# Patient Record
Sex: Male | Born: 1935 | Race: Black or African American | Hispanic: No | Marital: Married | Smoking: Former smoker
Health system: Southern US, Community
[De-identification: ages and names within clinical notes are randomized; demographics above are authoritative.]

## PROBLEM LIST (undated history)

## (undated) ENCOUNTER — Ambulatory Visit (HOSPITAL_COMMUNITY): Payer: Medicare Other

## (undated) DIAGNOSIS — I340 Nonrheumatic mitral (valve) insufficiency: Secondary | ICD-10-CM

## (undated) DIAGNOSIS — I10 Essential (primary) hypertension: Secondary | ICD-10-CM

## (undated) DIAGNOSIS — J3089 Other allergic rhinitis: Secondary | ICD-10-CM

## (undated) DIAGNOSIS — N35919 Unspecified urethral stricture, male, unspecified site: Secondary | ICD-10-CM

## (undated) DIAGNOSIS — E113293 Type 2 diabetes mellitus with mild 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, bilateral: Secondary | ICD-10-CM

## (undated) DIAGNOSIS — I77810 Thoracic aortic ectasia: Secondary | ICD-10-CM

## (undated) DIAGNOSIS — E785 Hyperlipidemia, unspecified: Secondary | ICD-10-CM

## (undated) DIAGNOSIS — K449 Diaphragmatic hernia without obstruction or gangrene: Secondary | ICD-10-CM

## (undated) DIAGNOSIS — I2510 Atherosclerotic heart disease of native coronary artery without angina pectoris: Secondary | ICD-10-CM

## (undated) DIAGNOSIS — I739 Peripheral vascular disease, unspecified: Secondary | ICD-10-CM

## (undated) DIAGNOSIS — N401 Enlarged prostate with lower urinary tract symptoms: Secondary | ICD-10-CM

## (undated) DIAGNOSIS — E669 Obesity, unspecified: Secondary | ICD-10-CM

## (undated) DIAGNOSIS — E1129 Type 2 diabetes mellitus with other diabetic kidney complication: Secondary | ICD-10-CM

## (undated) DIAGNOSIS — E1142 Type 2 diabetes mellitus with diabetic polyneuropathy: Secondary | ICD-10-CM

## (undated) DIAGNOSIS — I351 Nonrheumatic aortic (valve) insufficiency: Secondary | ICD-10-CM

## (undated) DIAGNOSIS — K219 Gastro-esophageal reflux disease without esophagitis: Secondary | ICD-10-CM

## (undated) DIAGNOSIS — M25512 Pain in left shoulder: Secondary | ICD-10-CM

## (undated) DIAGNOSIS — I5022 Chronic systolic (congestive) heart failure: Secondary | ICD-10-CM

## (undated) DIAGNOSIS — G8929 Other chronic pain: Secondary | ICD-10-CM

## (undated) DIAGNOSIS — N3941 Urge incontinence: Secondary | ICD-10-CM

## (undated) DIAGNOSIS — Z8546 Personal history of malignant neoplasm of prostate: Secondary | ICD-10-CM

## (undated) DIAGNOSIS — R809 Proteinuria, unspecified: Secondary | ICD-10-CM

## (undated) DIAGNOSIS — Z794 Long term (current) use of insulin: Secondary | ICD-10-CM

## (undated) HISTORY — PX: BREAST SURGERY: SHX581

## (undated) HISTORY — DX: Proteinuria, unspecified: R80.9

## (undated) HISTORY — DX: Nonrheumatic mitral (valve) insufficiency: I34.0

## (undated) HISTORY — DX: Thoracic aortic ectasia: I77.810

## (undated) HISTORY — DX: Hyperlipidemia, unspecified: E78.5

## (undated) HISTORY — DX: Unspecified urethral stricture, male, unspecified site: N35.919

## (undated) HISTORY — DX: Benign prostatic hyperplasia with lower urinary tract symptoms: N40.1

## (undated) HISTORY — DX: Long term (current) use of insulin: Z79.4

## (undated) HISTORY — DX: Gastro-esophageal reflux disease without esophagitis: K21.9

## (undated) HISTORY — DX: Obesity, unspecified: E66.9

## (undated) HISTORY — DX: Pain in left shoulder: M25.512

## (undated) HISTORY — DX: Diaphragmatic hernia without obstruction or gangrene: K44.9

## (undated) HISTORY — DX: Type 2 diabetes mellitus with mild 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, bilateral: E11.3293

## (undated) HISTORY — DX: Other allergic rhinitis: J30.89

## (undated) HISTORY — DX: Other chronic pain: G89.29

## (undated) HISTORY — DX: Essential (primary) hypertension: I10

## (undated) HISTORY — DX: Nonrheumatic aortic (valve) insufficiency: I35.1

## (undated) HISTORY — DX: Personal history of malignant neoplasm of prostate: Z85.46

## (undated) HISTORY — DX: Urge incontinence: N39.41

## (undated) HISTORY — DX: Type 2 diabetes mellitus with diabetic polyneuropathy: E11.42

## (undated) HISTORY — DX: Peripheral vascular disease, unspecified: I73.9

## (undated) HISTORY — DX: Type 2 diabetes mellitus with other diabetic kidney complication: E11.29

## (undated) HISTORY — DX: Atherosclerotic heart disease of native coronary artery without angina pectoris: I25.10

## (undated) HISTORY — DX: Chronic systolic (congestive) heart failure: I50.22

## (undated) HISTORY — PX: CORONARY ANGIOPLASTY WITH STENT PLACEMENT: SHX49

---

## 1988-10-04 HISTORY — PX: PROSTATE SURGERY: SHX751

## 1997-07-27 ENCOUNTER — Other Ambulatory Visit: Admission: RE | Admit: 1997-07-27 | Discharge: 1997-07-27 | Payer: Self-pay | Admitting: Urology

## 1997-08-03 ENCOUNTER — Ambulatory Visit (HOSPITAL_COMMUNITY): Admission: RE | Admit: 1997-08-03 | Discharge: 1997-08-03 | Payer: Self-pay | Admitting: Urology

## 1997-08-11 ENCOUNTER — Encounter: Admission: RE | Admit: 1997-08-11 | Discharge: 1997-11-09 | Payer: Self-pay | Admitting: Radiation Oncology

## 1997-09-08 ENCOUNTER — Ambulatory Visit (HOSPITAL_COMMUNITY): Admission: RE | Admit: 1997-09-08 | Discharge: 1997-09-08 | Payer: Self-pay | Admitting: Radiation Oncology

## 1997-10-04 ENCOUNTER — Ambulatory Visit (HOSPITAL_COMMUNITY): Admission: RE | Admit: 1997-10-04 | Discharge: 1997-10-04 | Payer: Self-pay | Admitting: Urology

## 1997-10-04 ENCOUNTER — Encounter: Payer: Self-pay | Admitting: Urology

## 1997-10-05 ENCOUNTER — Emergency Department (HOSPITAL_COMMUNITY): Admission: EM | Admit: 1997-10-05 | Discharge: 1997-10-05 | Payer: Self-pay | Admitting: Emergency Medicine

## 1997-12-28 ENCOUNTER — Emergency Department (HOSPITAL_COMMUNITY): Admission: EM | Admit: 1997-12-28 | Discharge: 1997-12-28 | Payer: Self-pay | Admitting: Emergency Medicine

## 1998-09-20 ENCOUNTER — Inpatient Hospital Stay (HOSPITAL_COMMUNITY): Admission: RE | Admit: 1998-09-20 | Discharge: 1998-09-20 | Payer: Self-pay | Admitting: Urology

## 2000-05-20 ENCOUNTER — Ambulatory Visit (HOSPITAL_COMMUNITY): Admission: RE | Admit: 2000-05-20 | Discharge: 2000-05-21 | Payer: Self-pay | Admitting: Cardiovascular Disease

## 2001-12-22 ENCOUNTER — Encounter: Admission: RE | Admit: 2001-12-22 | Discharge: 2002-03-22 | Payer: Self-pay | Admitting: Internal Medicine

## 2002-03-30 ENCOUNTER — Encounter: Admission: RE | Admit: 2002-03-30 | Discharge: 2002-06-28 | Payer: Self-pay | Admitting: Internal Medicine

## 2003-11-15 ENCOUNTER — Ambulatory Visit: Payer: Self-pay | Admitting: Internal Medicine

## 2003-12-22 ENCOUNTER — Ambulatory Visit: Payer: Self-pay | Admitting: Internal Medicine

## 2004-05-28 ENCOUNTER — Ambulatory Visit: Payer: Self-pay | Admitting: Internal Medicine

## 2004-06-26 ENCOUNTER — Ambulatory Visit: Payer: Self-pay | Admitting: Internal Medicine

## 2004-08-08 ENCOUNTER — Ambulatory Visit: Payer: Self-pay | Admitting: Internal Medicine

## 2004-10-16 ENCOUNTER — Encounter: Payer: Self-pay | Admitting: Internal Medicine

## 2004-12-11 ENCOUNTER — Ambulatory Visit: Payer: Self-pay | Admitting: Internal Medicine

## 2004-12-23 ENCOUNTER — Ambulatory Visit: Payer: Self-pay | Admitting: Internal Medicine

## 2004-12-27 ENCOUNTER — Ambulatory Visit (HOSPITAL_COMMUNITY): Admission: RE | Admit: 2004-12-27 | Discharge: 2004-12-27 | Payer: Self-pay | Admitting: Internal Medicine

## 2004-12-31 ENCOUNTER — Ambulatory Visit: Payer: Self-pay | Admitting: Internal Medicine

## 2005-01-01 ENCOUNTER — Ambulatory Visit (HOSPITAL_COMMUNITY): Admission: RE | Admit: 2005-01-01 | Discharge: 2005-01-01 | Payer: Self-pay | Admitting: Internal Medicine

## 2005-01-08 ENCOUNTER — Ambulatory Visit: Payer: Self-pay | Admitting: Internal Medicine

## 2005-01-16 ENCOUNTER — Ambulatory Visit: Payer: Self-pay | Admitting: Internal Medicine

## 2005-03-14 ENCOUNTER — Ambulatory Visit: Payer: Self-pay | Admitting: Internal Medicine

## 2005-04-02 ENCOUNTER — Ambulatory Visit: Payer: Self-pay | Admitting: Internal Medicine

## 2005-04-04 ENCOUNTER — Ambulatory Visit (HOSPITAL_COMMUNITY): Admission: RE | Admit: 2005-04-04 | Discharge: 2005-04-04 | Payer: Self-pay | Admitting: Nephrology

## 2005-04-09 ENCOUNTER — Ambulatory Visit: Payer: Self-pay | Admitting: Internal Medicine

## 2005-04-23 ENCOUNTER — Ambulatory Visit (HOSPITAL_COMMUNITY): Admission: RE | Admit: 2005-04-23 | Discharge: 2005-04-23 | Payer: Self-pay | Admitting: Internal Medicine

## 2005-04-24 ENCOUNTER — Ambulatory Visit: Payer: Self-pay | Admitting: Internal Medicine

## 2005-06-12 ENCOUNTER — Ambulatory Visit: Payer: Self-pay | Admitting: Internal Medicine

## 2005-06-23 ENCOUNTER — Ambulatory Visit: Payer: Self-pay | Admitting: Internal Medicine

## 2005-07-25 ENCOUNTER — Encounter: Payer: Self-pay | Admitting: Internal Medicine

## 2005-07-31 ENCOUNTER — Ambulatory Visit: Payer: Self-pay | Admitting: Internal Medicine

## 2005-08-27 ENCOUNTER — Ambulatory Visit: Payer: Self-pay | Admitting: Internal Medicine

## 2005-08-28 ENCOUNTER — Ambulatory Visit: Payer: Self-pay | Admitting: Internal Medicine

## 2005-08-28 LAB — CONVERTED CEMR LAB
LDL Cholesterol: 94 mg/dL
VLDL: 24 mg/dL

## 2005-10-01 ENCOUNTER — Ambulatory Visit: Payer: Self-pay | Admitting: Internal Medicine

## 2005-11-04 ENCOUNTER — Encounter: Payer: Self-pay | Admitting: Internal Medicine

## 2005-11-04 DIAGNOSIS — E1142 Type 2 diabetes mellitus with diabetic polyneuropathy: Secondary | ICD-10-CM

## 2005-11-04 DIAGNOSIS — E785 Hyperlipidemia, unspecified: Secondary | ICD-10-CM

## 2005-11-04 DIAGNOSIS — K219 Gastro-esophageal reflux disease without esophagitis: Secondary | ICD-10-CM | POA: Insufficient documentation

## 2005-11-04 DIAGNOSIS — I10 Essential (primary) hypertension: Secondary | ICD-10-CM

## 2005-11-04 DIAGNOSIS — K449 Diaphragmatic hernia without obstruction or gangrene: Secondary | ICD-10-CM

## 2005-11-04 HISTORY — DX: Essential (primary) hypertension: I10

## 2005-11-04 HISTORY — DX: Hyperlipidemia, unspecified: E78.5

## 2005-11-04 HISTORY — DX: Diaphragmatic hernia without obstruction or gangrene: K21.9

## 2005-11-04 HISTORY — DX: Type 2 diabetes mellitus with diabetic polyneuropathy: E11.42

## 2005-11-13 ENCOUNTER — Ambulatory Visit: Payer: Self-pay | Admitting: Internal Medicine

## 2005-11-13 ENCOUNTER — Encounter (INDEPENDENT_AMBULATORY_CARE_PROVIDER_SITE_OTHER): Payer: Self-pay | Admitting: *Deleted

## 2005-11-13 LAB — CONVERTED CEMR LAB
Albumin: 4.6 g/dL (ref 3.5–5.2)
Alkaline Phosphatase: 72 units/L (ref 39–117)
BUN: 23 mg/dL (ref 6–23)
Calcium: 9.7 mg/dL (ref 8.4–10.5)
Sodium: 140 meq/L (ref 135–145)
Total Bilirubin: 0.3 mg/dL (ref 0.3–1.2)
Total Protein: 7.9 g/dL (ref 6.0–8.3)

## 2006-02-17 DIAGNOSIS — Z8546 Personal history of malignant neoplasm of prostate: Secondary | ICD-10-CM

## 2006-02-17 DIAGNOSIS — J3089 Other allergic rhinitis: Secondary | ICD-10-CM

## 2006-02-17 HISTORY — DX: Personal history of malignant neoplasm of prostate: Z85.46

## 2006-02-17 HISTORY — DX: Other allergic rhinitis: J30.89

## 2006-02-18 ENCOUNTER — Ambulatory Visit: Payer: Self-pay | Admitting: Internal Medicine

## 2006-02-18 LAB — CONVERTED CEMR LAB
ALT: 17 units/L (ref 0–53)
AST: 14 units/L (ref 0–37)
Albumin: 4.2 g/dL (ref 3.5–5.2)
Alkaline Phosphatase: 71 units/L (ref 39–117)
BUN: 19 mg/dL (ref 6–23)
Chloride: 105 meq/L (ref 96–112)
Glucose, Bld: 129 mg/dL — ABNORMAL HIGH (ref 70–99)
Potassium: 4.3 meq/L (ref 3.5–5.3)
Total Bilirubin: 0.3 mg/dL (ref 0.3–1.2)
Total Protein: 7.5 g/dL (ref 6.0–8.3)

## 2006-03-03 ENCOUNTER — Ambulatory Visit: Payer: Self-pay | Admitting: Internal Medicine

## 2006-03-06 ENCOUNTER — Telehealth (INDEPENDENT_AMBULATORY_CARE_PROVIDER_SITE_OTHER): Payer: Self-pay | Admitting: *Deleted

## 2006-05-11 ENCOUNTER — Telehealth (INDEPENDENT_AMBULATORY_CARE_PROVIDER_SITE_OTHER): Payer: Self-pay | Admitting: *Deleted

## 2006-05-11 ENCOUNTER — Ambulatory Visit: Payer: Self-pay | Admitting: Internal Medicine

## 2006-05-12 ENCOUNTER — Encounter: Payer: Self-pay | Admitting: Internal Medicine

## 2006-05-22 ENCOUNTER — Encounter: Payer: Self-pay | Admitting: Internal Medicine

## 2006-05-27 ENCOUNTER — Ambulatory Visit: Payer: Self-pay | Admitting: Internal Medicine

## 2006-05-27 LAB — CONVERTED CEMR LAB: Blood Glucose, Fingerstick: 110

## 2006-06-09 ENCOUNTER — Ambulatory Visit: Payer: Self-pay | Admitting: Internal Medicine

## 2006-06-26 ENCOUNTER — Telehealth: Payer: Self-pay | Admitting: *Deleted

## 2006-06-26 LAB — CONVERTED CEMR LAB
ALT: 16 units/L (ref 0–53)
AST: 14 units/L (ref 0–37)
Albumin: 4.2 g/dL (ref 3.5–5.2)
BUN: 16 mg/dL (ref 6–23)
Calcium: 9.7 mg/dL (ref 8.4–10.5)
Cholesterol: 165 mg/dL (ref 0–200)
Eosinophils Relative: 2 % (ref 0–5)
Glucose, Bld: 92 mg/dL (ref 70–99)
Hemoglobin: 14.2 g/dL (ref 13.0–17.0)
LDL Cholesterol: 114 mg/dL — ABNORMAL HIGH (ref 0–99)
Lymphocytes Relative: 31 % (ref 12–46)
MCHC: 32.5 g/dL (ref 30.0–36.0)
MCV: 91 fL (ref 78.0–100.0)
Monocytes Absolute: 0.7 10*3/uL (ref 0.2–0.7)
Total Bilirubin: 0.5 mg/dL (ref 0.3–1.2)
Total CHOL/HDL Ratio: 5.2
Total Protein: 7.5 g/dL (ref 6.0–8.3)
Triglycerides: 97 mg/dL (ref <150)
VLDL: 19 mg/dL (ref 0–40)

## 2006-07-01 ENCOUNTER — Telehealth (INDEPENDENT_AMBULATORY_CARE_PROVIDER_SITE_OTHER): Payer: Self-pay | Admitting: Pharmacy Technician

## 2006-07-10 ENCOUNTER — Ambulatory Visit: Payer: Self-pay | Admitting: Internal Medicine

## 2006-07-10 ENCOUNTER — Encounter (INDEPENDENT_AMBULATORY_CARE_PROVIDER_SITE_OTHER): Payer: Self-pay | Admitting: Internal Medicine

## 2006-07-10 ENCOUNTER — Telehealth: Payer: Self-pay | Admitting: *Deleted

## 2006-07-10 LAB — CONVERTED CEMR LAB
Blood Glucose, Fingerstick: 119
Creatinine, Urine: 282.7 mg/dL
Leukocytes, UA: NEGATIVE
Microalb, Ur: 2.03 mg/dL — ABNORMAL HIGH (ref 0.00–1.89)
Specific Gravity, Urine: 1.028 (ref 1.005–1.03)
Urobilinogen, UA: 0.2 (ref 0.0–1.0)

## 2006-07-26 ENCOUNTER — Encounter: Payer: Self-pay | Admitting: Internal Medicine

## 2006-08-10 ENCOUNTER — Telehealth: Payer: Self-pay | Admitting: *Deleted

## 2006-08-27 ENCOUNTER — Telehealth (INDEPENDENT_AMBULATORY_CARE_PROVIDER_SITE_OTHER): Payer: Self-pay | Admitting: Pharmacy Technician

## 2006-09-09 ENCOUNTER — Ambulatory Visit: Payer: Self-pay | Admitting: Internal Medicine

## 2006-09-09 LAB — CONVERTED CEMR LAB
ALT: 17 units/L (ref 0–53)
Alkaline Phosphatase: 75 units/L (ref 39–117)
CO2: 24 meq/L (ref 19–32)
Calcium: 9.2 mg/dL (ref 8.4–10.5)
Glucose, Bld: 89 mg/dL (ref 70–99)
Hgb A1c MFr Bld: 7.6 %
Total Protein: 7.7 g/dL (ref 6.0–8.3)

## 2006-09-10 ENCOUNTER — Telehealth (INDEPENDENT_AMBULATORY_CARE_PROVIDER_SITE_OTHER): Payer: Self-pay | Admitting: Pharmacy Technician

## 2006-09-18 ENCOUNTER — Encounter: Payer: Self-pay | Admitting: Internal Medicine

## 2006-09-24 ENCOUNTER — Ambulatory Visit: Payer: Self-pay | Admitting: Internal Medicine

## 2006-09-24 LAB — CONVERTED CEMR LAB: Blood Glucose, Fingerstick: 154

## 2006-10-09 ENCOUNTER — Telehealth (INDEPENDENT_AMBULATORY_CARE_PROVIDER_SITE_OTHER): Payer: Self-pay | Admitting: Pharmacy Technician

## 2006-10-20 ENCOUNTER — Telehealth (INDEPENDENT_AMBULATORY_CARE_PROVIDER_SITE_OTHER): Payer: Self-pay | Admitting: *Deleted

## 2006-11-04 ENCOUNTER — Encounter: Payer: Self-pay | Admitting: Licensed Clinical Social Worker

## 2006-11-04 ENCOUNTER — Ambulatory Visit: Payer: Self-pay | Admitting: Internal Medicine

## 2006-12-08 ENCOUNTER — Telehealth: Payer: Self-pay | Admitting: *Deleted

## 2006-12-15 ENCOUNTER — Telehealth (INDEPENDENT_AMBULATORY_CARE_PROVIDER_SITE_OTHER): Payer: Self-pay | Admitting: *Deleted

## 2006-12-22 ENCOUNTER — Encounter (INDEPENDENT_AMBULATORY_CARE_PROVIDER_SITE_OTHER): Payer: Self-pay | Admitting: Internal Medicine

## 2006-12-22 ENCOUNTER — Ambulatory Visit: Payer: Self-pay | Admitting: *Deleted

## 2006-12-22 LAB — CONVERTED CEMR LAB
BUN: 15 mg/dL (ref 6–23)
Blood Glucose, Fingerstick: 285
CO2: 22 meq/L (ref 19–32)
Potassium: 3.9 meq/L (ref 3.5–5.3)

## 2007-06-04 ENCOUNTER — Encounter: Payer: Self-pay | Admitting: Internal Medicine

## 2007-06-30 ENCOUNTER — Ambulatory Visit: Payer: Self-pay | Admitting: Internal Medicine

## 2007-06-30 LAB — CONVERTED CEMR LAB
ALT: 21 units/L (ref 0–53)
AST: 18 units/L (ref 0–37)
Alkaline Phosphatase: 62 units/L (ref 39–117)
BUN: 20 mg/dL (ref 6–23)
Basophils Absolute: 0 10*3/uL (ref 0.0–0.1)
Basophils Relative: 0 % (ref 0–1)
Blood Glucose, Fingerstick: 184
CO2: 24 meq/L (ref 19–32)
Chloride: 104 meq/L (ref 96–112)
Cholesterol: 158 mg/dL (ref 0–200)
Creatinine, Urine: 117.6 mg/dL
Eosinophils Relative: 2 % (ref 0–5)
Glucose, Bld: 138 mg/dL — ABNORMAL HIGH (ref 70–99)
Hemoglobin, Urine: NEGATIVE
Hemoglobin: 13.6 g/dL (ref 13.0–17.0)
Leukocytes, UA: NEGATIVE
MCHC: 30.6 g/dL (ref 30.0–36.0)
MCV: 93.3 fL (ref 78.0–100.0)
Microalb Creat Ratio: 6.5 mg/g (ref 0.0–30.0)
Monocytes Absolute: 0.8 10*3/uL (ref 0.1–1.0)
Monocytes Relative: 7 % (ref 3–12)
Neutro Abs: 7.4 10*3/uL (ref 1.7–7.7)
Nitrite: NEGATIVE
RDW: 13.4 % (ref 11.5–15.5)
Sodium: 141 meq/L (ref 135–145)
Total Protein: 7.8 g/dL (ref 6.0–8.3)
Triglycerides: 188 mg/dL — ABNORMAL HIGH (ref <150)
Urine Glucose: NEGATIVE mg/dL
WBC: 11.5 10*3/uL — ABNORMAL HIGH (ref 4.0–10.5)

## 2007-07-01 ENCOUNTER — Encounter: Payer: Self-pay | Admitting: Internal Medicine

## 2007-09-14 ENCOUNTER — Ambulatory Visit: Payer: Self-pay | Admitting: Internal Medicine

## 2007-09-14 LAB — CONVERTED CEMR LAB
ALT: 19 units/L (ref 0–53)
AST: 18 units/L (ref 0–37)
Alkaline Phosphatase: 84 units/L (ref 39–117)
BUN: 18 mg/dL (ref 6–23)
Basophils Absolute: 0.1 10*3/uL (ref 0.0–0.1)
CO2: 22 meq/L (ref 19–32)
Chloride: 106 meq/L (ref 96–112)
Creatinine, Ser: 1.3 mg/dL (ref 0.40–1.50)
Eosinophils Relative: 2 % (ref 0–5)
Glucose, Bld: 224 mg/dL — ABNORMAL HIGH (ref 70–99)
HCT: 43.1 % (ref 39.0–52.0)
Hemoglobin: 13.9 g/dL (ref 13.0–17.0)
Hgb A1c MFr Bld: 7.4 %
Lymphs Abs: 3.5 10*3/uL (ref 0.7–4.0)
MCHC: 32.3 g/dL (ref 30.0–36.0)
MCV: 91.3 fL (ref 78.0–100.0)
Monocytes Absolute: 0.9 10*3/uL (ref 0.1–1.0)
Monocytes Relative: 7 % (ref 3–12)
Platelets: 269 10*3/uL (ref 150–400)
RDW: 13.4 % (ref 11.5–15.5)
Sodium: 142 meq/L (ref 135–145)

## 2007-09-22 ENCOUNTER — Ambulatory Visit: Payer: Self-pay | Admitting: Vascular Surgery

## 2007-09-22 ENCOUNTER — Ambulatory Visit (HOSPITAL_COMMUNITY): Admission: RE | Admit: 2007-09-22 | Discharge: 2007-09-22 | Payer: Self-pay | Admitting: Internal Medicine

## 2007-09-22 ENCOUNTER — Encounter: Payer: Self-pay | Admitting: Internal Medicine

## 2007-09-30 DIAGNOSIS — I739 Peripheral vascular disease, unspecified: Secondary | ICD-10-CM

## 2007-09-30 DIAGNOSIS — I2510 Atherosclerotic heart disease of native coronary artery without angina pectoris: Secondary | ICD-10-CM

## 2007-09-30 HISTORY — DX: Peripheral vascular disease, unspecified: I73.9

## 2007-09-30 HISTORY — DX: Atherosclerotic heart disease of native coronary artery without angina pectoris: I25.10

## 2007-10-12 ENCOUNTER — Encounter: Payer: Self-pay | Admitting: Internal Medicine

## 2007-10-19 ENCOUNTER — Ambulatory Visit: Payer: Self-pay | Admitting: Internal Medicine

## 2007-10-27 ENCOUNTER — Encounter: Payer: Self-pay | Admitting: Internal Medicine

## 2007-11-17 ENCOUNTER — Encounter: Payer: Self-pay | Admitting: Internal Medicine

## 2007-12-08 ENCOUNTER — Ambulatory Visit: Payer: Self-pay | Admitting: Internal Medicine

## 2007-12-08 LAB — CONVERTED CEMR LAB
Blood Glucose, Fingerstick: 210
Hgb A1c MFr Bld: 7.3 %

## 2008-01-04 ENCOUNTER — Encounter: Payer: Self-pay | Admitting: Internal Medicine

## 2008-01-11 ENCOUNTER — Encounter: Payer: Self-pay | Admitting: Internal Medicine

## 2008-03-01 ENCOUNTER — Ambulatory Visit: Payer: Self-pay | Admitting: Internal Medicine

## 2008-03-01 LAB — CONVERTED CEMR LAB: Hgb A1c MFr Bld: 7.2 %

## 2008-03-09 ENCOUNTER — Telehealth: Payer: Self-pay | Admitting: Internal Medicine

## 2008-06-07 ENCOUNTER — Telehealth: Payer: Self-pay | Admitting: Internal Medicine

## 2008-06-07 ENCOUNTER — Telehealth (INDEPENDENT_AMBULATORY_CARE_PROVIDER_SITE_OTHER): Payer: Self-pay | Admitting: Pharmacy Technician

## 2008-06-08 ENCOUNTER — Ambulatory Visit: Payer: Self-pay | Admitting: Internal Medicine

## 2008-06-08 ENCOUNTER — Telehealth (INDEPENDENT_AMBULATORY_CARE_PROVIDER_SITE_OTHER): Payer: Self-pay | Admitting: Pharmacy Technician

## 2008-06-08 ENCOUNTER — Encounter: Payer: Self-pay | Admitting: Internal Medicine

## 2008-06-08 LAB — CONVERTED CEMR LAB
Albumin: 4.1 g/dL (ref 3.5–5.2)
Calcium: 9.2 mg/dL (ref 8.4–10.5)
Chloride: 105 meq/L (ref 96–112)
Cholesterol: 141 mg/dL (ref 0–200)
GFR calc non Af Amer: 56 mL/min — ABNORMAL LOW (ref >60)
HDL: 30 mg/dL — ABNORMAL LOW (ref >39)
Hgb A1c MFr Bld: 8.1 %
LDL Cholesterol: 87 mg/dL (ref 0–99)
Total Bilirubin: 0.3 mg/dL (ref 0.3–1.2)
Triglycerides: 121 mg/dL (ref <150)

## 2008-06-09 ENCOUNTER — Inpatient Hospital Stay (HOSPITAL_BASED_OUTPATIENT_CLINIC_OR_DEPARTMENT_OTHER): Admission: RE | Admit: 2008-06-09 | Discharge: 2008-06-09 | Payer: Self-pay | Admitting: Cardiovascular Disease

## 2008-06-14 ENCOUNTER — Inpatient Hospital Stay (HOSPITAL_COMMUNITY): Admission: RE | Admit: 2008-06-14 | Discharge: 2008-06-15 | Payer: Self-pay | Admitting: Cardiovascular Disease

## 2008-07-05 ENCOUNTER — Ambulatory Visit: Payer: Self-pay | Admitting: Internal Medicine

## 2008-07-05 LAB — CONVERTED CEMR LAB
CO2: 23 meq/L (ref 19–32)
Chloride: 108 meq/L (ref 96–112)
GFR calc Af Amer: >60 mL/min (ref >60)
Glucose, Bld: 76 mg/dL (ref 70–99)
PSA: 0.05 ng/mL — ABNORMAL LOW (ref 0.10–4.00)
Potassium: 4.4 meq/L (ref 3.5–5.3)
Sodium: 142 meq/L (ref 135–145)

## 2008-07-14 ENCOUNTER — Encounter: Payer: Self-pay | Admitting: Internal Medicine

## 2008-08-03 ENCOUNTER — Encounter: Payer: Self-pay | Admitting: Internal Medicine

## 2008-08-22 ENCOUNTER — Ambulatory Visit: Payer: Self-pay | Admitting: Internal Medicine

## 2008-08-22 LAB — CONVERTED CEMR LAB
Blood Glucose, Fingerstick: 133
Creatinine, Urine: 184.2 mg/dL
Hgb A1c MFr Bld: 6.8 %
Microalb Creat Ratio: 8.1 mg/g (ref 0.0–30.0)

## 2008-09-19 ENCOUNTER — Ambulatory Visit: Payer: Self-pay | Admitting: Internal Medicine

## 2008-09-19 LAB — CONVERTED CEMR LAB
OCCULT 1: NEGATIVE
OCCULT 3: NEGATIVE

## 2008-10-04 ENCOUNTER — Inpatient Hospital Stay (HOSPITAL_COMMUNITY): Admission: AD | Admit: 2008-10-04 | Discharge: 2008-10-06 | Payer: Self-pay | Admitting: Cardiovascular Disease

## 2008-10-05 HISTORY — PX: CARDIAC CATHETERIZATION: SHX172

## 2008-10-13 ENCOUNTER — Ambulatory Visit: Payer: Self-pay | Admitting: Infectious Diseases

## 2008-10-13 ENCOUNTER — Encounter: Payer: Self-pay | Admitting: Internal Medicine

## 2008-11-08 ENCOUNTER — Ambulatory Visit: Payer: Self-pay | Admitting: Internal Medicine

## 2008-11-08 LAB — CONVERTED CEMR LAB
Blood Glucose, Fingerstick: 115
Calcium: 9.8 mg/dL (ref 8.4–10.5)
Chloride: 104 meq/L (ref 96–112)
Creatinine, Ser: 1.18 mg/dL (ref 0.40–1.50)
Sodium: 141 meq/L (ref 135–145)

## 2009-01-07 ENCOUNTER — Encounter: Payer: Self-pay | Admitting: Internal Medicine

## 2009-01-15 ENCOUNTER — Encounter: Payer: Self-pay | Admitting: Internal Medicine

## 2009-01-17 ENCOUNTER — Ambulatory Visit: Payer: Self-pay | Admitting: Internal Medicine

## 2009-01-17 LAB — CONVERTED CEMR LAB
ALT: 12 units/L (ref 0–53)
AST: 12 units/L (ref 0–37)
Albumin: 4.3 g/dL (ref 3.5–5.2)
Alkaline Phosphatase: 65 units/L (ref 39–117)
BUN: 20 mg/dL (ref 6–23)
Basophils Relative: 0 % (ref 0–1)
Bilirubin Urine: NEGATIVE
Blood Glucose, Fingerstick: 87
CO2: 24 meq/L (ref 19–32)
Calcium: 10.1 mg/dL (ref 8.4–10.5)
Creatinine, Ser: 1.2 mg/dL (ref 0.40–1.50)
Eosinophils Absolute: 0.3 10*3/uL (ref 0.0–0.7)
Glucose, Bld: 85 mg/dL (ref 70–99)
Hemoglobin, Urine: NEGATIVE
Hemoglobin: 13.9 g/dL (ref 13.0–17.0)
Lymphocytes Relative: 23 % (ref 12–46)
MCHC: 33.6 g/dL (ref 30.0–36.0)
Platelets: 253 10*3/uL (ref 150–400)
Potassium: 4.5 meq/L (ref 3.5–5.3)
RDW: 13.7 % (ref 11.5–15.5)
Sodium: 141 meq/L (ref 135–145)
Total Protein: 7.4 g/dL (ref 6.0–8.3)
Urine Glucose: NEGATIVE mg/dL
Urobilinogen, UA: 0.2 (ref 0.0–1.0)
pH: 5 (ref 5.0–8.0)

## 2009-04-25 ENCOUNTER — Ambulatory Visit (HOSPITAL_COMMUNITY)
Admission: RE | Admit: 2009-04-25 | Discharge: 2009-04-25 | Payer: Self-pay | Source: Home / Self Care | Admitting: Internal Medicine

## 2009-04-25 ENCOUNTER — Ambulatory Visit: Payer: Self-pay | Admitting: Internal Medicine

## 2009-04-25 ENCOUNTER — Encounter: Payer: Self-pay | Admitting: Licensed Clinical Social Worker

## 2009-05-03 ENCOUNTER — Ambulatory Visit: Payer: Self-pay | Admitting: Surgery

## 2009-05-03 ENCOUNTER — Ambulatory Visit: Payer: Self-pay | Admitting: Internal Medicine

## 2009-05-03 ENCOUNTER — Encounter: Payer: Self-pay | Admitting: Internal Medicine

## 2009-05-03 ENCOUNTER — Encounter: Payer: Self-pay | Admitting: Licensed Clinical Social Worker

## 2009-05-03 ENCOUNTER — Ambulatory Visit (HOSPITAL_COMMUNITY): Admission: RE | Admit: 2009-05-03 | Discharge: 2009-05-03 | Payer: Self-pay | Admitting: Internal Medicine

## 2009-05-03 ENCOUNTER — Telehealth: Payer: Self-pay | Admitting: Licensed Clinical Social Worker

## 2009-05-24 ENCOUNTER — Telehealth: Payer: Self-pay | Admitting: Internal Medicine

## 2009-06-12 ENCOUNTER — Ambulatory Visit: Payer: Self-pay | Admitting: Internal Medicine

## 2009-06-12 ENCOUNTER — Telehealth: Payer: Self-pay | Admitting: Internal Medicine

## 2009-06-12 LAB — CONVERTED CEMR LAB: Hgb A1c MFr Bld: 6.7 %

## 2009-06-20 ENCOUNTER — Telehealth: Payer: Self-pay

## 2009-06-25 ENCOUNTER — Ambulatory Visit: Payer: Self-pay | Admitting: Surgery

## 2009-06-25 ENCOUNTER — Encounter: Payer: Self-pay | Admitting: Internal Medicine

## 2009-07-08 ENCOUNTER — Telehealth: Payer: Self-pay | Admitting: Internal Medicine

## 2009-07-10 ENCOUNTER — Ambulatory Visit: Payer: Self-pay | Admitting: Surgery

## 2009-07-10 ENCOUNTER — Ambulatory Visit (HOSPITAL_COMMUNITY): Admission: RE | Admit: 2009-07-10 | Discharge: 2009-07-10 | Payer: Self-pay | Admitting: Surgery

## 2009-08-01 ENCOUNTER — Encounter: Payer: Self-pay | Admitting: Internal Medicine

## 2009-08-27 ENCOUNTER — Encounter: Payer: Self-pay | Admitting: Internal Medicine

## 2009-09-03 ENCOUNTER — Encounter: Payer: Self-pay | Admitting: Internal Medicine

## 2009-09-03 ENCOUNTER — Ambulatory Visit: Payer: Self-pay | Admitting: Surgery

## 2009-09-07 ENCOUNTER — Telehealth: Payer: Self-pay | Admitting: Internal Medicine

## 2009-09-10 ENCOUNTER — Encounter: Payer: Self-pay | Admitting: Internal Medicine

## 2009-11-13 ENCOUNTER — Ambulatory Visit: Payer: Self-pay | Admitting: Internal Medicine

## 2009-11-13 LAB — CONVERTED CEMR LAB
Albumin: 4.3 g/dL (ref 3.5–5.2)
CO2: 27 meq/L (ref 19–32)
Calcium: 9.7 mg/dL (ref 8.4–10.5)
Chloride: 104 meq/L (ref 96–112)
Creatinine, Ser: 1.14 mg/dL (ref 0.40–1.50)
Hgb A1c MFr Bld: 6.8 %
Total Bilirubin: 0.4 mg/dL (ref 0.3–1.2)
Total Protein: 7.3 g/dL (ref 6.0–8.3)

## 2009-12-04 ENCOUNTER — Encounter: Payer: Self-pay | Admitting: Internal Medicine

## 2010-01-16 ENCOUNTER — Ambulatory Visit: Payer: Self-pay | Admitting: Cardiovascular Disease

## 2010-02-06 ENCOUNTER — Ambulatory Visit: Admit: 2010-02-06 | Payer: Self-pay | Admitting: Internal Medicine

## 2010-03-03 LAB — CONVERTED CEMR LAB
Albumin: 4.2 g/dL (ref 3.5–5.2)
Alkaline Phosphatase: 70 units/L (ref 39–117)
BUN: 11 mg/dL (ref 6–23)
Basophils Relative: 0 % (ref 0–1)
Calcium: 9.4 mg/dL (ref 8.4–10.5)
Chloride: 108 meq/L (ref 96–112)
Cholesterol: 97 mg/dL (ref 0–200)
Creatinine, Ser: 1.14 mg/dL (ref 0.40–1.50)
Eosinophils Absolute: 0.2 10*3/uL (ref 0.0–0.7)
Eosinophils Relative: 2 % (ref 0–5)
HCT: 43 % (ref 39.0–52.0)
HDL: 32 mg/dL — ABNORMAL LOW (ref >39)
Hemoglobin: 13.6 g/dL (ref 13.0–17.0)
Ketones, ur: NEGATIVE mg/dL
Leukocytes, UA: NEGATIVE
Neutro Abs: 5.8 10*3/uL (ref 1.7–7.7)
Protein, ur: NEGATIVE mg/dL
RDW: 13.5 % (ref 11.5–15.5)
Sodium: 143 meq/L (ref 135–145)
Total Bilirubin: 0.5 mg/dL (ref 0.3–1.2)
Total CHOL/HDL Ratio: 3
Total Protein: 7.3 g/dL (ref 6.0–8.3)
Triglycerides: 58 mg/dL (ref <150)
Urobilinogen, UA: 0.2 (ref 0.0–1.0)
VLDL: 12 mg/dL (ref 0–40)

## 2010-03-07 NOTE — Medication Information (Signed)
Summary: CLONIDINE HYDROCHLOROTHIAZIDE  CLONIDINE HYDROCHLOROTHIAZIDE   Imported By: Margie Billet 05/04/2009 15:19:27  _____________________________________________________________________  External Attachment:    Type:   Image     Comment:   External Document

## 2010-03-07 NOTE — Progress Notes (Signed)
Summary: urology appointment  Phone Note Outgoing Call Call back at Home Phone [phone]   Call placed by: [facility] NT II,  Jun 20, 2009 3:27 PM Call placed to: Patient Action Taken: Appt scheduled Reason for Call: Confirm/change Appt Details for Reason: urology appointment Summary of Call: [name] has appointment at the urology [facility] on August 01, 2009 @1 :00 with DR. BadLin, he also has appointment to come in to the clinic for bladder ultrasound on Thursday May 19,2011 at @11 :00 Patient understood instructions. Initial call taken by: Filomena Jungling NT II,  Jun 20, 2009 3:31 PM  Follow-up for Phone Call        patient came in today vi100 cc in urinal, bladder scan done patient  had 18ml left in bladder will forward this to DR. Meredith Pel Follow-up by: Davonda Ausley [place] II,  Jun 21, 2009 11:46 AM

## 2010-03-07 NOTE — Progress Notes (Signed)
Summary: refill/gg  Phone Note Refill Request  on Jun 12, 2009 4:29 PM  Refills Requested: Medication #1:  HUMALOG PEN 100 UNIT/ML SOLN Inject 10 units subcutaneously each day with breakfast   Last Refilled: 05/07/2009  Method Requested: Electronic Initial call taken by: Merrie Roof RN,  Jun 12, 2009 4:29 PM  Follow-up for Phone Call        Refilled electronically.  Follow-up by: Margarito Liner MD,  Jun 12, 2009 5:37 PM    Prescriptions: HUMALOG PEN 100 UNIT/ML SOLN (INSULIN LISPRO (HUMAN)) Inject 10 units subcutaneously each day with breakfast, 8 units with lunch,  and 12 units with supper  #1 vial x 11   Entered and Authorized by:   Margarito Liner MD   Signed by:   Margarito Liner MD on 06/12/2009   Method used:   Electronically to        [facility] [id number] (retail)       [address] [place], [place]  [address]       Ph: [phone]       Fax: [phone]   RxID:   [id number]

## 2010-03-07 NOTE — Consult Note (Signed)
[facility]   Imported By: Margie Billet 08/09/2009 14:06:14  _____________________________________________________________________  External Attachment:    Type:   Image     Comment:   External Document

## 2010-03-07 NOTE — Consult Note (Signed)
[facility]   Imported By: Margie Billet 08/08/2009 11:01:15  _____________________________________________________________________  External Attachment:    Type:   Image     Comment:   External Document

## 2010-03-07 NOTE — Progress Notes (Signed)
Summary: F/U Exertional Claudication  Phone Note Outgoing Call   Call placed by: Margarito Liner MD,  May 24, 2009 3:21 PM Call placed to: Patient Summary of Call: I called patient to follow up on his exertional calf pain. See A/P below. Initial call taken by: Margarito Liner MD,  May 24, 2009 3:25 PM      Impression & Recommendations:  Problem # 1:  PERIPHERAL VASCULAR DISEASE WITH CLAUDICATION (ICD-443.89)  I called patient to follow up on his exertional calf pain.  He is still having calf pain provoked by 1/2 block of walking, especially if uphill; the pain is relieved by rest.  He is having no rest pain or other signs/sx of ischemia.  I discussed the results of his lower extremity arterial dopplers, and the plan is to refer to CVTS for evaluation.  Patient understands and agrees with the plan.  Orders: Vascular Clinic (Vascular)

## 2010-03-07 NOTE — Progress Notes (Signed)
Summary: Soc. Work/Patient Ineligible Phys [employer] Note [employer] placed by: Soc. Work Call placed to: [employer] of Call: Nelly Rout at [facility] to see if Mr. Godman would qualify for Phys [facility]  Donnamarie Poag checked the system and Mr. Trupiano was not receiving low income subsidy or extra help under Medicare so would not qualify for their program.

## 2010-03-07 NOTE — Assessment & Plan Note (Signed)
Summary: F/U/EST/VS   Vital Signs:  Patient profile:   77 year old male Height:      74 inches (187.96 cm) Weight:      258.6 pounds (117.55 kg) BMI:     33.32 Temp:     97.8 degrees F (36.56 degrees C) oral Pulse rate:   58 / minute BP sitting:   138 / 80  (right arm)  Vitals Entered By: Filomena Jungling NT II (April 25, 2009 8:39 AM) CC: 3 month checkup Is Patient Diabetic? Yes Did you bring your meter with you today? Yes Pain Assessment Patient in pain? yes     Location: feet and leg Intensity: 10 Type: aching Onset of pain  Chronic Nutritional Status BMI of > 30 = obese CBG Result 142  Have you ever been in a relationship where you felt threatened, hurt or afraid?No   Does patient need assistance? Functional Status Self care Ambulation Normal    Primary Care Provider:  Margarito Liner MD  CC:  3 month checkup.  History of Present Illness: Patient returns for follow-up of his diabetes mellitus, hypertension, hyperlipidemia, and other chronic medical problems.  His main complaint today is exertional calf pain, left greater than right, which has been progressively worse over the past few months.  He denies any resting calf pain, and also denies any pallor or numbness of his legs or feet.  He reports occasional chest discomfort which is midsternal, occurs once or twice per week, is aggravated or brought on by certain foods, and is relieved by Tums.  The pain is nonexertional, and the last time it occurred was last week.  In the past he reports similar chest discomfort that resolved on Prilosec.  He reports ongoing problems with chronic nocturia and urinary incontinence after voiding.  We made a urology referral a few months ago, but he reports that they would not see him because they do not participate in his insurance plan.  His blood sugars have been doing well; he has not seen his endocrinologist since his last visit here.  (His blood glucose log show a zero whenever he did  not check his blood sugar).  Preventive Screening-Counseling & Management  Alcohol-Tobacco     Smoking Status: quit     Year Quit: 1977 (approximate)     Pack years: 10  Caffeine-Diet-Exercise     Caffeine use/day: 1     Does Patient Exercise: yes     Type of exercise: Walking, limited by leg pain.     Times/week: 6  Current Medications (verified): 1)  Metformin Hcl 500 Mg  Tabs (Metformin Hcl) .... Take 2 Tablets By Mouth Each Am, 1 Tablet At Noon, and 2 Tablets Each Pm. 2)  Metoprolol Tartrate 25 Mg Tabs (Metoprolol Tartrate) .... Take 1 Tablet By Mouth Two Times A Day 3)  Lisinopril 40 Mg Tabs (Lisinopril) .... Take One-Half Tablet By Mouth Two Times A Day 4)  Hydrochlorothiazide 25 Mg Tabs (Hydrochlorothiazide) .... Take 1/2 Tablet By Mouth Once A Day 5)  Allegra 60 Mg Tabs (Fexofenadine Hcl) .... Take 1 Tablet By Mouth Two Times A Day 6)  Norvasc 10 Mg Tabs (Amlodipine Besylate) .... Take 1 Tablet By Mouth Once A Day 7)  Accu-Chek Aviva  Strp (Glucose Blood) .... To Test Cbg Two Times A Day 8)  Accu-Chek Multiclix Lancets  Misc (Lancets) 9)  Simvastatin 80 Mg Tabs (Simvastatin) .... Take 1 Tablet By Mouth At Bedtime 10)  Insulin Syringe 31g X 5/16"  1 Ml  Misc (Insulin Syringe-Needle U-100) .... To Inject Insulin Subcutaneously Every Day 11)  Anacin 81 Mg  Tbec (Aspirin) .... Take 1 Pill By Mouth Daily. 12)  Clonidine Hcl 0.1 Mg Tabs (Clonidine Hcl) .... Take 1 Tablet By Mouth Two Times A Day 13)  Fluticasone Propionate 50 Mcg/act Susp (Fluticasone Propionate) .... Take 2 Sprays in Each Nostril Once A Day 14)  Lantus 100 Unit/ml Soln (Insulin Glargine) .... Inject 40 Units Subcutaneously Each Am 15)  Humalog Pen 100 Unit/ml Soln (Insulin Lispro (Human)) .... Inject 10 Units Subcutaneously Each Day With Breakfast, 8 Units With Lunch,  and 12 Units With Supper 16)  Plavix 75 Mg Tabs (Clopidogrel Bisulfate) .... Take 1 Tablet By Mouth Once A Day  Allergies (verified): No Known Drug  Allergies  Review of Systems      See HPI General:  Denies chills, fever, and sweats. CV:  Complains of leg cramps with exertion; denies resting leg pain and; denies lower extremity pallor or numbness. Resp:  Denies shortness of breath. GI:  Denies abdominal pain, nausea, and vomiting. GU:  Complains of incontinence and nocturia. MS:  Denies muscle aches and cramps.  Physical Exam  General:  alert, no distress Lungs:  normal respiratory effort, normal breath sounds, no crackles, and no wheezes.   Heart:  normal rate, regular rhythm, no murmur, no gallop, and no rub.   Abdomen:  soft, non-tender, normal bowel sounds, no hepatomegaly, and no splenomegaly.   Extremities:  no edema  Diabetes Management Exam:    Foot Exam (with socks and/or shoes not present):       Sensory-Monofilament:          Left foot: normal          Right foot: normal   Impression & Recommendations:  Problem # 1:  DIABETES MELLITUS, TYPE II (ICD-250.00) Patient's diabetes mellitus is well controlled on current regimen.  Plan is to continue current medications, and continue home capillary blood glucose monitoring.   His updated medication list for this problem includes:    Metformin Hcl 500 Mg Tabs (Metformin hcl) .[name]... Take 2 tablets by mouth each am, 1 tablet at noon, and 2 tablets each pm.    Lisinopril 40 Mg Tabs (Lisinopril) .[name]... Take one-half tablet by mouth two times a day    Anacin 81 Mg Tbec (Aspirin) .[name]... Take 1 pill by mouth daily.    Lantus 100 Unit/ml Soln (Insulin glargine) ..... Inject 40 units subcutaneously each am    Humalog Pen 100 Unit/ml Soln (Insulin lispro (human)) ..... Inject 10 units subcutaneously each day with breakfast, 8 units with lunch,  and 12 units with supper  Labs Reviewed: Creat: 1.20 (01/17/2009)     Last Eye Exam: No diabetic retinopathy.   Exam by Dorothyann Gibbs, MD  (10/27/2007) Reviewed HgBA1c results: 6.8 (04/25/2009)  6.6 (01/17/2009)  Orders: T-Hgb A1C  (in-house) (16109UE) T- Capillary Blood Glucose (82948)Future Orders: T-Comprehensive Metabolic Panel ([id number]) ... 04/26/2009  Problem # 2:  HYPERTENSION (ICD-401.9) Patient's blood pressure is improved compared to last visit.  His systolic pressure is slightly above target, but I will hold off on adjusting his medication regimen pending the workup of his exertional claudication.  Plan is to continue medications as below.  His updated medication list for this problem includes:    Metoprolol Tartrate 25 Mg Tabs (Metoprolol tartrate) .[name]... Take 1 tablet by mouth two times a day    Lisinopril 40 Mg Tabs (Lisinopril) .[name]... Take one-half  tablet by mouth two times a day    Hydrochlorothiazide 25 Mg Tabs (Hydrochlorothiazide) .[name]... Take 1/2 tablet by mouth once a day    Norvasc 10 Mg Tabs (Amlodipine besylate) .[name]... Take 1 tablet by mouth once a day    Clonidine Hcl 0.1 Mg Tabs (Clonidine hcl) .[name]... Take 1 tablet by mouth two times a day  BP today: 138/80 Prior BP: 149/76 (01/17/2009)  Labs Reviewed: K+: 4.5 (01/17/2009) Creat: : 1.20 (01/17/2009)   Chol: 141 (06/08/2008)   HDL: 30 (06/08/2008)   LDL: 87 (06/08/2008)   TG: 121 (06/08/2008)  Problem # 3:  PERIPHERAL VASCULAR DISEASE WITH CLAUDICATION (ICD-443.89) Patient reports progressive worsening of his exertional calf claudication, left greater than right, over the past few months.  He has no rest pain.  The plan is to obtain lower extremity arterial Doppler/ABI study.  Problem # 4:  HYPERCHOLESTEROLEMIA (ICD-272.0) Patient is doing well on current regimen.  He will return for a fasting lipid panel.  His updated medication list for this problem includes:    Simvastatin 80 Mg Tabs (Simvastatin) .[name]... Take 1 tablet by mouth at bedtime  Labs Reviewed: SGOT: 12 (01/17/2009)   SGPT: 12 (01/17/2009)   HDL:30 (06/08/2008), 33 (06/30/2007)  LDL:87 (06/08/2008), 87 (06/30/2007)  Chol:141 (06/08/2008), 158 (06/30/2007)  Trig:121 (06/08/2008),  188 (06/30/2007)  Future Orders: T-Lipid Profile ([id number]) ... 04/26/2009  Problem # 5:  CORONARY ARTERY DISEASE (ICD-414.00) Patient is doing well on current regimen.  The symptom of chest discomfort relieved by Tums and aggravated by food suggests GERD rather than angina.  I advised him to let me know if his symptoms progress or worsen in any way.  Plan for now is to continue current medication regimen.  His updated medication list for this problem includes:    Metoprolol Tartrate 25 Mg Tabs (Metoprolol tartrate) .[name]... Take 1 tablet by mouth two times a day    Lisinopril 40 Mg Tabs (Lisinopril) .[name]... Take one-half tablet by mouth two times a day    Hydrochlorothiazide 25 Mg Tabs (Hydrochlorothiazide) .[name]... Take 1/2 tablet by mouth once a day    Norvasc 10 Mg Tabs (Amlodipine besylate) .[name]... Take 1 tablet by mouth once a day    Anacin 81 Mg Tbec (Aspirin) .[name]... Take 1 pill by mouth daily.    Clonidine Hcl 0.1 Mg Tabs (Clonidine hcl) .[name]... Take 1 tablet by mouth two times a day    Plavix 75 Mg Tabs (Clopidogrel bisulfate) .[name]... Take 1 tablet by mouth once a day  Orders: 12 Lead EKG (12 Lead EKG)Future Orders: T-CBC w/Diff ([id number]) ... 04/26/2009  Problem # 6:  GERD (ICD-530.81) Patient reports relief of symptoms in the past with omeprazole.  Plan is to start omeprazole 20 mg daily.  His updated medication list for this problem includes:    Omeprazole 20 Mg Cpdr (Omeprazole) ..... Marland Kitchentake 1 capsule by mouth once a day  Problem # 7:  NOCTURIA (EXB-284.13) Patient reports that he could not be seen by local urologist because they did not participate in his insurance plan.  The plan is to refer to [facility] if possible for a urology evaluation.  Future Orders: T-Urinalysis ([id number]) ... 04/26/2009  Problem # 8:  PROSTATE CANCER, HX OF (ICD-V10.46) See #7 above.  Last PSA was 0.05 on 01/17/2009.  Complete Medication List: 1)  Metformin Hcl 500 Mg Tabs (Metformin  hcl) .... Take 2 tablets by mouth each am, 1 tablet at noon, and 2 tablets each pm. 2)  Metoprolol Tartrate 25 Mg Tabs (Metoprolol tartrate) .... Take 1 tablet by mouth two times a day 3)  Lisinopril 40 Mg Tabs (Lisinopril) .... Take one-half tablet by mouth two times a day 4)  Hydrochlorothiazide 25 Mg Tabs (Hydrochlorothiazide) .... Take 1/2 tablet by mouth once a day 5)  Allegra 60 Mg Tabs (Fexofenadine hcl) .... Take 1 tablet by mouth two times a day 6)  Norvasc 10 Mg Tabs (Amlodipine besylate) .... Take 1 tablet by mouth once a day 7)  Accu-chek Aviva Strp (Glucose blood) .... To test cbg two times a day 8)  Accu-chek Multiclix Lancets Misc (Lancets) 9)  Simvastatin 80 Mg Tabs (Simvastatin) .... Take 1 tablet by mouth at bedtime 10)  Insulin Syringe 31g X 5/16" 1 Ml Misc (Insulin syringe-needle u-100) .... To inject insulin subcutaneously every day 11)  Anacin 81 Mg Tbec (Aspirin) .... Take 1 pill by mouth daily. 12)  Clonidine Hcl 0.1 Mg Tabs (Clonidine hcl) .... Take 1 tablet by mouth two times a day 13)  Fluticasone Propionate 50 Mcg/act Susp (Fluticasone propionate) .... Take 2 sprays in each nostril once a day 14)  Lantus 100 Unit/ml Soln (Insulin glargine) .... Inject 40 units subcutaneously each am 15)  Humalog Pen 100 Unit/ml Soln (Insulin lispro (human)) .... Inject 10 units subcutaneously each day with breakfast, 8 units with lunch,  and 12 units with supper 16)  Plavix 75 Mg Tabs (Clopidogrel bisulfate) .... Take 1 tablet by mouth once a day 17)  Omeprazole 20 Mg Cpdr (Omeprazole) .... .take 1 capsule by mouth once a day  Other Orders: TD Toxoids IM 7 YR + (16109) Admin 1st Vaccine (60454) LE Arterial Doppler/ABI (LE arterial doppler)  Patient Instructions: 1)  Please schedule a follow-up appointment in 1 month. 2)  Please keep appointment for lower extremity arterial doppler study to check the circulation in your legs. 3)  Start omeprazole 20 mg daily. 4)  Please return  for fasting blood work within 1 week.  Prescriptions: HYDROCHLOROTHIAZIDE 25 MG TABS (HYDROCHLOROTHIAZIDE) Take 1/2 tablet by mouth once a day  #15 x 11   Entered and Authorized by:   Margarito Liner MD   Signed by:   Margarito Liner MD on 04/26/2009   Method used:   Telephoned to ...       [facility] ([facility])       [address]       [place], [place]  [address]       Ph: [phone]       Fax: [phone]   RxID:   [id number]   Process Orders Check Orders Results:     [facility]: ABN not required for this insurance Tests Sent for requisitioning (April 26, 2009 5:35 PM):     04/26/2009: [facility] -- T-Lipid Profile [phone] (signed)     04/26/2009: [facility] -- T-Comprehensive Metabolic Panel [[id number]] (signed)     04/26/2009: [facility] -- T-CBC w/Diff [[id number]] (signed)     04/26/2009: [facility] -- T-Urinalysis [[id number]] (signed)    Prevention & Chronic Care Immunizations   Influenza vaccine: Fluvax 3+  (11/08/2008)   Influenza vaccine deferral: Not available  (08/22/2008)   Influenza vaccine due: 10/04/2009    Tetanus booster: 04/25/2009: Td   Tetanus booster due: 04/26/2019    Pneumococcal vaccine: Pneumovax (State)  (02/18/2006)   Pneumococcal vaccine due: None    H. zoster vaccine: Not documented  H. zoster vaccine deferral: Deferred  (08/22/2008)  Colorectal Screening   Hemoccult: negative x 3  (09/19/2008)   Hemoccult action/deferral: Ordered  (08/22/2008)   Hemoccult due: 10/2009    Colonoscopy: Normal colonoscopy to the proximal ascending colon with visualization down into the cecum from a distance.  Despite all maneuvers, could not advance the scope down to the tip of the cecum to identify the appendiceal orifice area.  However, no gross lesions were seen from a distance.  Exam by Wandalee Ferdinand MD, [facility]   (10/16/2004)   Colonoscopy due: 10/17/2014  Other Screening   PSA: 0.05  (01/17/2009)   PSA due due: 06/09/2007   Smoking status: quit  (04/25/2009)  Diabetes Mellitus   HgbA1C: 6.8  (04/25/2009)   Hemoglobin A1C due: 11/08/2008    Eye exam: No diabetic retinopathy.   Exam by Dorothyann Gibbs, MD   (10/27/2007)   Eye exam due: 11/2008    Foot exam: yes  (04/25/2009)   Foot exam action/deferral: Do today   High risk foot: Yes  (04/25/2009)   Foot care education: Done  (04/25/2009)   Foot exam due: 07/26/2009    Urine microalbumin/creatinine ratio: 8.1  (08/22/2008)   Urine microalbumin action/deferral: Ordered   Urine microalbumin/cr due: 08/22/2009    Diabetes flowsheet reviewed?: Yes   Progress toward A1C goal: At goal  Lipids   Total Cholesterol: 141  (06/08/2008)   Lipid panel action/deferral: Lipid Panel ordered   LDL: 87  (06/08/2008)   LDL Direct: Not documented   HDL: 30  (06/08/2008)   Triglycerides: 121  (06/08/2008)   Lipid panel due: 12/09/2008    SGOT (AST): 12  (01/17/2009)   BMP action: Ordered   SGPT (ALT): 12  (01/17/2009) CMP ordered    Alkaline phosphatase: 65  (01/17/2009)   Total bilirubin: 0.3  (01/17/2009)   Liver panel due: 07/18/2009    Lipid flowsheet reviewed?: Yes   Progress toward LDL goal: At goal  Hypertension   Last Blood Pressure: 138 / 80  (04/25/2009)   Serum creatinine: 1.20  (01/17/2009)   BMP action: Ordered   Serum potassium 4.5  (01/17/2009) CMP ordered    Basic metabolic panel due: 12/09/2008    Hypertension flowsheet reviewed?: Yes   Progress toward BP goal: Improved  Self-Management Support :   Personal Goals (by the next clinic visit) :     Personal A1C goal: 7  (08/22/2008)     Personal blood pressure goal: 130/80  (08/22/2008)     Personal LDL goal: 100  (08/22/2008)    Patient will work on the following items until the next clinic visit to reach self-care goals:     Medications and monitoring: take my  medicines every day, bring all of my medications to every visit, examine my feet every day  (04/25/2009)     Eating: drink diet soda or water instead of juice or soda, eat more vegetables, use fresh or frozen vegetables, eat foods that are low in salt, eat baked foods instead of fried foods, eat fruit for snacks and desserts, limit or avoid alcohol  (04/25/2009)     Activity: take a 30 minute walk every day  (01/17/2009)     Home glucose monitoring frequency: 2 times a day  (08/22/2008)    Diabetes self-management support: Education handout, Resources for patients handout, Written self-care plan  (04/25/2009)   Diabetes care plan printed   Diabetes education handout printed   Last diabetes self-management training by diabetes  educator: 06/08/2008    Hypertension self-management support: Education handout, Resources for patients handout, Written self-care plan  (04/25/2009)   Hypertension self-care plan printed.   Hypertension education handout printed    Lipid self-management support: Education handout, Resources for patients handout, Written self-care plan  (04/25/2009)   Lipid self-care plan printed.   Lipid education handout printed      Resource handout printed.   Nursing Instructions: Diabetic foot exam today Give tetanus booster today    Diabetic Foot Exam Foot Inspection Is there a history of a foot ulcer?              No Is there a foot ulcer now?              No Can the patient see the bottom of their feet?          Yes Are the shoes appropriate in style and fit?          Yes Is there swelling or an abnormal foot shape?          No Are the toenails long?                No Are the toenails thick?                No Are the toenails ingrown?              No Is there heavy callous build-up?              No Is there pain in the calf muscle (Intermittent claudication) when walking?    NoIs there a claw toe deformity?              No Is there elevated skin temperature?             No Is there limited ankle dorsiflexion?            No Is there foot or ankle muscle weakness?            No  Diabetic Foot Care Education Patient educated on appropriate care of diabetic feet.  Pulse Check          Right Foot          Left Foot Posterior Tibial:        diminished            diminished Dorsalis Pedis:        diminished            diminished  High Risk Feet? Yes Set Next Diabetic Foot Exam here: 07/26/2009   10-g (5.07) Semmes-Weinstein Monofilament Test Performed by: Filomena Jungling NT II          Right Foot          Left Foot Site 1         normal         normal Site 2         normal         normal Site 3         normal         normal Site 4         normal         normal Site 5         normal         normal Site 6         normal         normal Site  7         normal         normal Site 8         normal         normal Site 9         normal         normal  Impression      normal         normal   Laboratory Results   Blood Tests   Date/Time Received: April 25, 2009 9:00 AM  Date/Time Reported: Burke Keels  April 25, 2009 9:01 AM   HGBA1C: 6.8%   (Normal Range: Non-Diabetic - 3-6%   Control Diabetic - 6-8%) CBG Random:: 142mg /dL      Immunizations Administered:  Tetanus Vaccine:    Vaccine Type: Td    Site: left deltoid    Mfr: [employer]    Dose: 0.5 ml    Route: IM    Given by: Krystal Eaton (AAMA)    Exp. Date: 04/28/2911    Lot #: [id number]    VIS given: 12/22/06 version given April 25, 2009.

## 2010-03-07 NOTE — Consult Note (Signed)
Summary: [facility]  [facility]   Imported By: Louretta Parma 09/17/2009 12:21:59  _____________________________________________________________________  External Attachment:    Type:   Image     Comment:   External Document

## 2010-03-07 NOTE — Assessment & Plan Note (Signed)
Summary: Social Work   Social Work Evaluation Date  04/25/2009 Patient name [name] [name]  Primary MD   : Margarito Liner MD Social Worker's name : Dorothe Pea MSW- LCSW  Home [phone]    Cell phone: .  [name]     Alternate phone: . [name]       Individual making referral: Mamie and Dr.Joines  Primary Reason for Referral:     Assist with Medicare D/Medicaid Eligibility/Medication Assist Comments [name] [name] is reporting copayments under his Medicare D of over $200 per month.  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   Action taken by Social Work: Based on the information the patient has given me he would just meet the cutoff for Extra Help under Medicare D.  I urged him to go see [name] [name] at [employer] for consultation.  Bring all income information and medications.   If he does not qualify for that program, she may have some other ideas on Medicare D plans.   The patient can be reached at [phone] for follow-up and I will call him next week to see how he did.

## 2010-03-07 NOTE — Letter (Signed)
Summary: Glucose Meter Download  DOWNLOAD REPORT   Imported By: Margie Billet 06/15/2009 10:08:42  _____________________________________________________________________  External Attachment:    Type:   Image     Comment:   External Document

## 2010-03-07 NOTE — Assessment & Plan Note (Signed)
Summary: Soc. Work  Patient has paperwork from [employer] for MD to sign off on.    Mamie and I met with him and it looks like we can help him immediately with his [employer] paperwork under his [name] [name] which he tells [place] will send 90 day supply of clonodine, hydrochlorothiazide, metformin, metoprolol tartrate, simvastatin, amlodipine (Norvasc) for free according to [employer].   Dr. Meredith Pel and I went thru the paperwork and it was signed and faxed in with electronic scripts.   We have now asked the patient to go to [facility] for consultation on the remainder of meds since we no longer do patient assistance.

## 2010-03-07 NOTE — Assessment & Plan Note (Signed)
Summary: EST-CK/FU/MEDS/CFB   Vital Signs:  Patient profile:   77 year old male Height:      74 inches Weight:      254.6 pounds BMI:     32.81 Temp:     97.3 degrees F oral Pulse rate:   60 / minute BP sitting:   152 / 86  (right arm)  Vitals Entered By: Filomena Jungling NT II (November 13, 2009 2:30 PM) CC: F/U DM, HTN, hyperlipidemia Is Patient Diabetic? Yes Did you bring your meter with you today? Yes Pain Assessment Patient in pain? yes     Location: back and shoulder Intensity: 7 Type: aching Onset of pain  Constant Nutritional Status BMI of > 30 = obese CBG Result 151  Have you ever been in a relationship where you felt threatened, hurt or afraid?No   Does patient need assistance? Functional Status Self care Ambulation Normal    Primary Care Provider:  Margarito Liner MD  CC:  F/U DM, HTN, and hyperlipidemia.  History of Present Illness: Patient returns for follow-up of his diabetes mellitus, hypertension, hyperlipidemia, and other chronic medical problems.  He has no acute complaints.  Since his last visit here, he was seen by a urologist at [facility]; no new medications were prescribed there. He reports that his urinary symptoms have improved although he still has some symptoms of urgency. He has continued to follow up with his endocrinologist. His blood sugars have been doing reasonably well (see copy of glucometer report). He reports that he stopped taking simvastatin over one month ago because he heard about possible side effects; he denies having had any side effects. Otherwise, he reports that he has been compliant with his medications.  Preventive Screening-Counseling & Management  Alcohol-Tobacco     Smoking Status: quit     Year Quit: 1977 (approximate)     Pack years: 10  Caffeine-Diet-Exercise     Caffeine use/day: 1     Does Patient Exercise: yes     Type of exercise: Walking, limited by leg pain.     Times/week: 6  -  Date:   09/10/2009    PSA 0.04  Current Medications (verified): 1)  Metformin Hcl 500 Mg  Tabs (Metformin Hcl) .... Take 2 Tablets By Mouth Each Am, 1 Tablet At Noon, and 2 Tablets Each Pm. 2)  Metoprolol Tartrate 25 Mg Tabs (Metoprolol Tartrate) .... Take 1 Tablet By Mouth Two Times A Day 3)  Lisinopril 40 Mg Tabs (Lisinopril) .... Take One-Half Tablet By Mouth Two Times A Day 4)  Hydrochlorothiazide 25 Mg Tabs (Hydrochlorothiazide) .... Take 1/2 Tablet By Mouth Once A Day 5)  Allegra 60 Mg Tabs (Fexofenadine Hcl) .... Take 1 Tablet By Mouth Two Times A Day 6)  Norvasc 10 Mg Tabs (Amlodipine Besylate) .... Take 1 Tablet By Mouth Once A Day 7)  Accu-Chek Aviva  Strp (Glucose Blood) .... To Test Cbg Two Times A Day 8)  Accu-Chek Multiclix Lancets  Misc (Lancets) 9)  Simvastatin 80 Mg Tabs (Simvastatin) .... Take 1 Tablet By Mouth At Bedtime 10)  Insulin Syringe 31g X 5/16" 1 Ml  Misc (Insulin Syringe-Needle U-100) .... To Inject Insulin Subcutaneously Every Day 11)  Anacin 81 Mg  Tbec (Aspirin) .... Take 1 Pill By Mouth Daily. 12)  Clonidine Hcl 0.1 Mg Tabs (Clonidine Hcl) .... Take 1 Tablet By Mouth Two Times A Day 13)  Fluticasone Propionate 50 Mcg/act Susp (Fluticasone Propionate) .... Take 2 Sprays in Each Nostril  Once A Day 14)  Lantus 100 Unit/ml Soln (Insulin Glargine) .... Inject 26 Units Subcutaneously Each Am 15)  Humalog Kwikpen 100 Unit/ml Soln (Insulin Lispro (Human)) .... Inject 10 Units Subcutaneously Each Day With Breakfast, 8 Units With Lunch,  and 10 Units With Supper 16)  Plavix 75 Mg Tabs (Clopidogrel Bisulfate) .... Take 1 Tablet By Mouth Once A Day 17)  Omeprazole 20 Mg Cpdr (Omeprazole) .... .take 1 Capsule By Mouth Once A Day 18)  Tamsulosin Hcl 0.4 Mg Caps (Tamsulosin Hcl) .... Take 1 Capsule By Mouth Once A Day  Allergies (verified): No Known Drug Allergies  Past History:  Past Medical History: Coronary artery disease, followed by Dr. Kristeen Miss:       Cardiac cath  05/20/2000 showed moderate CAD with diffuse mild irregularities of all three coronary arteries.      Cardiac cath 06/09/2008 showed diffuse three-vessel coronary artery disease, with tight stenosis in the left circumflex artery and right coronary artery.      S/P successful percutaneous transluminal coronary angioplasty and stenting of the left circumflex marginal vessel by Dr. Kristeen Miss on Jun 14, 2008.     s/p unstable angina, s/p cardiac cath 10/05/08 that showed 100% occlusion of the obtuse marginal, s/p angipolasty. Type II diabetes mellitus Diverticulosis, colon GERD Hypertension Hypercholesterolemia Prostate cancer, hx of Allergic rhinitis Peripheral vascular disease Nocturia and urge urinary incontinence, seen at [facility]  Review of Systems General:  Denies chills, fever, and loss of appetite. CV:  Denies chest pain or discomfort and swelling of feet. Resp:  Denies chest discomfort and shortness of breath. GI:  Denies abdominal pain, bloody stools, change in bowel habits, nausea, and vomiting. GU:  Complains of nocturia; denies urinary hesitancy. MS:  Denies muscle aches and cramps.  Physical Exam  General:  alert, no distress Lungs:  normal respiratory effort, normal breath sounds, no crackles, and no wheezes.   Heart:  normal rate, regular rhythm, no murmur, no gallop, and no rub.   Abdomen:  soft, non-tender, normal bowel sounds, no hepatomegaly, and no splenomegaly.   Extremities:  no edema  Diabetes Management Exam:    Foot Exam (with socks and/or shoes not present):       Sensory-Monofilament:          Left foot: normal          Right foot: normal   Impression & Recommendations:  Problem # 1:  DIABETES MELLITUS, TYPE II (ICD-250.00) Patient's diabetes is well controlled on his current regimen. The plan is to continue medications as below. He will followup with his endocrinologist as scheduled.  His updated medication list for this problem  includes:    Metformin Hcl 500 Mg Tabs (Metformin hcl) .[name]... Take 2 tablets by mouth each am, 1 tablet at noon, and 2 tablets each pm.    Lisinopril 40 Mg Tabs (Lisinopril) .[name]... Take one-half tablet by mouth two times a day    Anacin 81 Mg Tbec (Aspirin) .[name]... Take 1 pill by mouth daily.    Lantus 100 Unit/ml Soln (Insulin glargine) ..... Inject 26 units subcutaneously each am    Humalog Kwikpen 100 Unit/ml Soln (Insulin lispro (human)) ..... Inject 10 units subcutaneously each day with breakfast, 8 units with lunch,  and 10 units with supper  Orders: T-Hgb A1C (in-house) (16109UE) T-Urine Microalbumin w/creat. ratio [id number]) T- Capillary Blood Glucose (95621)  Labs Reviewed: Creat: 1.14 (05/03/2009)     Last Eye Exam: No diabetic retinopathy.  Cataract.   Exam by [facility]  (08/03/2009) Reviewed HgBA1c results: 6.8 (11/13/2009)  6.7 (06/12/2009)  Problem # 2:  HYPERTENSION (ICD-401.9) Patient's blood pressure is above target range; he reports that he is compliant with his medications. The plan is to increase clonidine to a dose of 0.2 mg b.i.d.  His updated medication list for this problem includes:    Metoprolol Tartrate 25 Mg Tabs (Metoprolol tartrate) .[name]... Take 1 tablet by mouth two times a day    Lisinopril 40 Mg Tabs (Lisinopril) .[name]... Take one-half tablet by mouth two times a day    Hydrochlorothiazide 25 Mg Tabs (Hydrochlorothiazide) .[name]... Take 1/2 tablet by mouth once a day    Norvasc 10 Mg Tabs (Amlodipine besylate) .[name]... Take 1 tablet by mouth once a day    Clonidine Hcl 0.1 Mg Tabs (Clonidine hcl) .[name]... Take 2 tablets by mouth two times a day  BP today: 152/86 Prior BP: 155/80 (06/12/2009)  Labs Reviewed: K+: 4.5 (05/03/2009) Creat: : 1.14 (05/03/2009)   Chol: 97 (05/03/2009)   HDL: 32 (05/03/2009)   LDL: 53 (05/03/2009)   TG: 58 (05/03/2009)  Problem # 3:  HYPERCHOLESTEROLEMIA (ICD-272.0) Patient stopped taking simvastatin because he heard about  possible side effects. He denies actually having diffuse muscle pain or other problems. He does report occasional shoulder pain but this is unlikely to be a side effect of his statin. The plan is to start Crestor 5 mg daily in place of simvastatin. Will recheck a fasting lipid panel in 3-6 months.  I advised him to let me know if he has any problems.  His updated medication list for this problem includes:    Crestor 5 Mg Tabs (Rosuvastatin calcium) .[name]... Take 1 tablet by mouth once a day  Labs Reviewed: SGOT: 15 (05/03/2009)   SGPT: 13 (05/03/2009)   HDL:32 (05/03/2009), 30 (06/08/2008)  LDL:53 (05/03/2009), 87 (06/08/2008)  Chol:97 (05/03/2009), 141 (06/08/2008)  Trig:58 (05/03/2009), 121 (06/08/2008)  Problem # 4:  CORONARY ARTERY DISEASE (ICD-414.00) Patient has no active symptoms. The plan is to continue his medication regimen as below.  His updated medication list for this problem includes:    Metoprolol Tartrate 25 Mg Tabs (Metoprolol tartrate) .[name]... Take 1 tablet by mouth two times a day    Lisinopril 40 Mg Tabs (Lisinopril) .[name]... Take one-half tablet by mouth two times a day    Hydrochlorothiazide 25 Mg Tabs (Hydrochlorothiazide) .[name]... Take 1/2 tablet by mouth once a day    Norvasc 10 Mg Tabs (Amlodipine besylate) .[name]... Take 1 tablet by mouth once a day    Anacin 81 Mg Tbec (Aspirin) .[name]... Take 1 pill by mouth daily.    Clonidine Hcl 0.1 Mg Tabs (Clonidine hcl) .[name]... Take 2 tablets by mouth two times a day    Plavix 75 Mg Tabs (Clopidogrel bisulfate) .[name]... Take 1 tablet by mouth once a day  Labs Reviewed: Chol: 97 (05/03/2009)   HDL: 32 (05/03/2009)   LDL: 53 (05/03/2009)   TG: 58 (05/03/2009)  Problem # 5:  URINARY INCONTINENCE (ICD-788.30) This symptom has improved following his visit to a urologist at [facility].  Problem # 6:  GERD (ICD-530.81) Patient's symptoms are well-controlled on current dose of omeprazole. Will continue as before.  His updated medication list for  this problem includes:    Omeprazole 20 Mg Cpdr (Omeprazole) ..... Marland Kitchentake 1 capsule by mouth once a day  Complete Medication List: 1)  Metformin Hcl 500 Mg Tabs (Metformin hcl) .... Take 2 tablets  by mouth each am, 1 tablet at noon, and 2 tablets each pm. 2)  Metoprolol Tartrate 25 Mg Tabs (Metoprolol tartrate) .... Take 1 tablet by mouth two times a day 3)  Lisinopril 40 Mg Tabs (Lisinopril) .... Take one-half tablet by mouth two times a day 4)  Hydrochlorothiazide 25 Mg Tabs (Hydrochlorothiazide) .... Take 1/2 tablet by mouth once a day 5)  Allegra 60 Mg Tabs (Fexofenadine hcl) .... Take 1 tablet by mouth two times a day 6)  Norvasc 10 Mg Tabs (Amlodipine besylate) .... Take 1 tablet by mouth once a day 7)  Accu-chek Aviva Strp (Glucose blood) .... To test cbg two times a day 8)  Accu-chek Multiclix Lancets Misc (Lancets) 9)  Crestor 5 Mg Tabs (Rosuvastatin calcium) .... Take 1 tablet by mouth once a day 10)  Insulin Syringe 31g X 5/16" 1 Ml Misc (Insulin syringe-needle u-100) .... To inject insulin subcutaneously every day 11)  Anacin 81 Mg Tbec (Aspirin) .... Take 1 pill by mouth daily. 12)  Clonidine Hcl 0.1 Mg Tabs (Clonidine hcl) .... Take 2 tablets by mouth two times a day 13)  Fluticasone Propionate 50 Mcg/act Susp (Fluticasone propionate) .... Take 2 sprays in each nostril once a day 14)  Lantus 100 Unit/ml Soln (Insulin glargine) .... Inject 26 units subcutaneously each am 15)  Humalog Kwikpen 100 Unit/ml Soln (Insulin lispro (human)) .... Inject 10 units subcutaneously each day with breakfast, 8 units with lunch,  and 10 units with supper 16)  Plavix 75 Mg Tabs (Clopidogrel bisulfate) .... Take 1 tablet by mouth once a day 17)  Omeprazole 20 Mg Cpdr (Omeprazole) .... .take 1 capsule by mouth once a day 18)  Tamsulosin Hcl 0.4 Mg Caps (Tamsulosin hcl) .... Take 1 capsule by mouth once a day  Other Orders: T-Hemoccult Card-Multiple (take home) (16109) T-Comprehensive Metabolic Panel  ([id number]) Influenza Vaccine NON MCR (91478)  Patient Instructions: 1)  Please schedule a follow-up appointment in 2 months. 2)  Stop simvastatin. 3)  Start Crestor 5 mg one tablet daily for high cholesterol. 4)  Increase clonidine 0.1 mg to a dose of 2 tablets twice a day. 5)  Please complete and return the hemoccult cards as instructed. Prescriptions: CRESTOR 5 MG TABS (ROSUVASTATIN CALCIUM) Take 1 tablet by mouth once a day  #90 x 1   Entered and Authorized by:   Margarito Liner MD   Signed by:   Margarito Liner MD on 11/13/2009   Method used:   Faxed to ...       [facility] (mail-order)             , [place]         Ph: [phone]       Fax: [phone]   RxID:   [id number] CLONIDINE HCL 0.1 MG TABS (CLONIDINE HCL) Take 2 tablets by mouth two times a day  #360 x 1   Entered and Authorized by:   Margarito Liner MD   Signed by:   Margarito Liner MD on 11/13/2009   Method used:   Faxed to ...       [facility] (mail-order)             , [place]         Ph: [phone]       Fax: [phone]   RxID:   [id number] ALLEGRA 60 MG TABS (FEXOFENADINE HCL) Take 1 tablet by mouth two times a day  #180 x 1   Entered and Authorized  by:   Margarito Liner MD   Signed by:   Margarito Liner MD on 11/13/2009   Method used:   Faxed to ...       [facility] (mail-order)             , [place]         Ph: [phone]       Fax: [phone]   RxID:   [id number] TAMSULOSIN HCL 0.4 MG CAPS (TAMSULOSIN HCL) Take 1 capsule by mouth once a day  #90 x 1   Entered and Authorized by:   Margarito Liner MD   Signed by:   Margarito Liner MD on 11/13/2009   Method used:   Faxed to ...       [facility] (mail-order)             , [place]         Ph: [phone]       Fax: [phone]   RxID:   [id number]   Prevention & Chronic Care Immunizations   Influenza vaccine: Fluvax Non-MCR  (11/13/2009)   Influenza vaccine deferral: Not available  (08/22/2008)   Influenza vaccine due:  10/04/2009    Tetanus booster: 04/25/2009: Td   Tetanus booster due: 04/26/2019    Pneumococcal vaccine: Pneumovax (State)  (02/18/2006)   Pneumococcal vaccine due: None    H. zoster vaccine: Not documented   H. zoster vaccine deferral: Deferred  (08/22/2008)  Colorectal Screening   Hemoccult: negative x 3  (09/19/2008)   Hemoccult action/deferral: Ordered  (11/13/2009)   Hemoccult due: 10/2009    Colonoscopy: Normal colonoscopy to the proximal ascending colon with visualization down into the cecum from a distance.  Despite all maneuvers, could not advance the scope down to the tip of the cecum to identify the appendiceal orifice area.  However, no gross lesions were seen from a distance.  Exam by Wandalee Ferdinand MD, [facility]  (10/16/2004)   Colonoscopy due: 10/17/2014  Other Screening   PSA: 0.04  (09/10/2009)   PSA due due: 06/09/2007   Smoking status: quit  (11/13/2009)  Diabetes Mellitus   HgbA1C: 6.8  (11/13/2009)   Hemoglobin A1C due: 11/08/2008    Eye exam: No diabetic retinopathy.   Cataract.   Exam by [facility]   (08/03/2009)   Eye exam due: 02/2010    Foot exam: yes  (11/13/2009)   Foot exam action/deferral: Do today   High risk foot: Yes  (11/13/2009)   Foot care education: Done  (11/13/2009)   Foot exam due: 02/13/2010    Urine microalbumin/creatinine ratio: 8.1  (08/22/2008)   Urine microalbumin action/deferral: Ordered   Urine microalbumin/cr due: 08/22/2009    Diabetes flowsheet reviewed?: Yes   Progress toward A1C goal: At goal  Lipids   Total Cholesterol: 97  (05/03/2009)   Lipid panel action/deferral: Lipid Panel ordered   LDL: 53  (05/03/2009)   LDL Direct: Not documented   HDL: 32  (05/03/2009)   Triglycerides: 58  (05/03/2009)   Lipid panel due: 12/09/2008    SGOT (AST): 15  (05/03/2009)   BMP action: Ordered   SGPT (ALT): 13  (05/03/2009) CMP ordered    Alkaline phosphatase: 70  (05/03/2009)   Total bilirubin: 0.5   (05/03/2009)   Liver panel due: 07/18/2009    Lipid flowsheet reviewed?: Yes   Progress toward LDL goal: At goal  Hypertension   Last Blood Pressure: 152 / 86  (11/13/2009)   Serum creatinine: 1.14  (  05/03/2009)   BMP action: Ordered   Serum potassium 4.5  (05/03/2009) CMP ordered    Basic metabolic panel due: 12/09/2008    Hypertension flowsheet reviewed?: Yes   Progress toward BP goal: Unchanged  Self-Management Support :   Personal Goals (by the next clinic visit) :     Personal A1C goal: 7  (08/22/2008)     Personal blood pressure goal: 130/80  (08/22/2008)     Personal LDL goal: 100  (08/22/2008)    Patient will work on the following items until the next clinic visit to reach self-care goals:     Medications and monitoring: take my medicines every day, check my blood sugar, bring all of my medications to every visit, examine my feet every day  (11/13/2009)     Eating: drink diet soda or water instead of juice or soda, eat more vegetables, use fresh or frozen vegetables, eat foods that are low in salt, eat baked foods instead of fried foods, eat fruit for snacks and desserts, limit or avoid alcohol  (11/13/2009)     Activity: take a 30 minute walk every day  (11/13/2009)     Home glucose monitoring frequency: 2 times a day  (08/22/2008)    Diabetes self-management support: Education handout, Resources for patients handout, Written self-care plan  (11/13/2009)   Diabetes care plan printed   Diabetes education handout printed   Last diabetes self-management training by diabetes educator: 06/08/2008    Hypertension self-management support: Education handout, Resources for patients handout, Written self-care plan  (11/13/2009)   Hypertension self-care plan printed.   Hypertension education handout printed    Lipid self-management support: Education handout, Resources for patients handout, Written self-care plan  (11/13/2009)   Lipid self-care plan printed.   Lipid education  handout printed      Resource handout printed.   Nursing Instructions: Diabetic foot exam today Provide Hemoccult cards with instructions (see order) Give Flu vaccine today    Diabetic Foot Exam Foot Inspection Is there a history of a foot ulcer?              No Is there a foot ulcer now?              No Can the patient see the bottom of their feet?          Yes Are the shoes appropriate in style and fit?          Yes Is there swelling or an abnormal foot shape?          No Are the toenails long?                No Are the toenails thick?                No Are the toenails ingrown?              No Is there heavy callous build-up?              No Is there pain in the calf muscle (Intermittent claudication) when walking?    NoIs there a claw toe deformity?              No Is there elevated skin temperature?            No Is there limited ankle dorsiflexion?            No Is there foot or ankle muscle weakness?  No  Diabetic Foot Care Education Patient educated on appropriate care of diabetic feet.  Pulse Check          Right Foot          Left Foot Posterior Tibial:        diminished            diminished Dorsalis Pedis:        diminished            diminished  High Risk Feet? Yes Set Next Diabetic Foot Exam here: 02/13/2010   10-g (5.07) Semmes-Weinstein Monofilament Test Performed by: Filomena Jungling NT II          Right Foot          Left Foot Site 1         normal         normal Site 2         normal         normal Site 3         normal         normal Site 4         normal         normal Site 5         normal         normal Site 6         normal         normal Site 7         normal         normal Site 8         normal         normal Site 9         normal         normal  Impression      normal         normal  Laboratory Results   Blood Tests   Date/Time Received: November 13, 2009 3:09 PM Date/Time Reported: Alric Quan  November 13, 2009 3:09  PM   HGBA1C: 6.8%   (Normal Range: Non-Diabetic - 3-6%   Control Diabetic - 6-8%) CBG Random:: 151mg /dL    Process Orders Check Orders Results:     [facility]: ABN not required for this insurance Tests Sent for requisitioning (November 16, 2009 1:35 PM):     11/13/2009: [facility] -- T-Urine Microalbumin w/creat. ratio [[id number]] (signed)     11/13/2009: [facility] -- T-Comprehensive Metabolic Panel [phone] (signed)      Influenza Vaccine    Vaccine Type: Fluvax Non-MCR    Site: left deltoid    Mfr: [email or web address]    Dose: 0.5 ml    Route: IM    Given by: Chinita Pester RN    Exp. Date: 08/03/2010    Lot #: [id number]    VIS given: 08/28/09 version given November 13, 2009.  Flu Vaccine Consent Questions    Do you have a history of severe allergic reactions to this vaccine? no    Any prior history of allergic reactions to egg and/or gelatin? no    Do you have a sensitivity to the preservative Thimersol? no    Do you have a past history of Guillan-Barre Syndrome? no    Do you currently have an acute febrile illness? no    Have you ever had a severe reaction to latex? no    Vaccine information given and explained to patient? yes

## 2010-03-07 NOTE — Progress Notes (Signed)
Summary: Converted 05/10/2009 flag [id number] eye exam  ---- Converted from flag ---- ---- 05/10/2009 4:06 PM, Mamie Hague NT II wrote: patient last eye exam was 09 i have just mailed a letter to mr. Hanton to call there officie to make appointment for his eye exam.  mamie  ---- 04/26/2009 5:34 PM, Margarito Liner MD wrote: Please get copy of last eye exam. ------------------------------

## 2010-03-07 NOTE — Consult Note (Signed)
Summary: [facility]  [facility]   Imported By: Shon Hough 08/27/2009 15:03:38  _____________________________________________________________________  External Attachment:    Type:   Image     Comment:   External Document  Appended Document: [facility]   Diabetic Eye Exam  Procedure date:  08/03/2009  Findings:      No diabetic retinopathy.   Cataract.   Exam by [facility]   Procedures Next Due Date:    Diabetic Eye Exam: 02/2010
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
## 2010-03-07 NOTE — Consult Note (Signed)
Summary: [facility]   Imported By: Louretta Parma 10/17/2009 15:23:53  _____________________________________________________________________  External Attachment:    Type:   Image     Comment:   External Document

## 2010-03-07 NOTE — Consult Note (Signed)
Summary: Reather Littler MD  Reather Littler MD   Imported By: Louretta Parma 12/07/2009 15:35:55  _____________________________________________________________________  External Attachment:    Type:   Image     Comment:   External Document

## 2010-03-07 NOTE — Progress Notes (Signed)
Summary: REfill/gh  Phone Note Refill Request Message from:  Patient on September 07, 2009 12:24 PM  Refills Requested: Medication #1:  METFORMIN HCL 500 MG  TABS Take 2 tablets by mouth each AM  Medication #2:  HYDROCHLOROTHIAZIDE 25 MG TABS Take 1/2 tablet by mouth once a day  Medication #3:  LISINOPRIL 40 MG TABS Take one-half tablet by mouth two times a day  Medication #4:  CLONIDINE HCL 0.1 MG TABS Take 1 tablet by mouth two times a day  Method Requested: Fax to Local Pharmacy Initial call taken by: Angelina Ok RN,  September 07, 2009 12:25 PM  Follow-up for Phone Call        Pt was to F/U two months after May appt. Sent Ms Lissa Hoard a flag to make appt. Follow-up by: Blanch Media MD,  September 07, 2009 1:56 PM    Prescriptions: CLONIDINE HCL 0.1 MG TABS (CLONIDINE HCL) Take 1 tablet by mouth two times a day  #180 x 3   Entered and Authorized by:   Blanch Media MD   Signed by:   Blanch Media MD on 09/07/2009   Method used:   Faxed to ...       [facility] (mail-order)             , [place]         Ph: [phone]       Fax: [phone]   RxID:   [id number] HYDROCHLOROTHIAZIDE 25 MG TABS (HYDROCHLOROTHIAZIDE) Take 1/2 tablet by mouth once a day  #45 x 3   Entered and Authorized by:   Blanch Media MD   Signed by:   Blanch Media MD on 09/07/2009   Method used:   Faxed to ...       [facility] (mail-order)             , [place]         Ph: [phone]       Fax: [phone]   RxID:   [id number] LISINOPRIL 40 MG TABS (LISINOPRIL) Take one-half tablet by mouth two times a day  #90 x 3   Entered and Authorized by:   Blanch Media MD   Signed by:   Blanch Media MD on 09/07/2009   Method used:   Faxed to ...       [facility] (mail-order)             , [place]         Ph: [phone]       Fax: [phone]   RxID:   [id number] METFORMIN HCL 500 MG  TABS (METFORMIN HCL) Take 2 tablets by mouth each AM, 1 tablet at  noon, and 2 tablets each PM.  #150.0 Each x 3   Entered and Authorized by:   Blanch Media MD   Signed by:   Blanch Media MD on 09/07/2009   Method used:   Faxed to ...       [facility] (mail-order)             , [place]         Ph: [phone]       Fax: [phone]   RxID:   [id number]

## 2010-04-17 ENCOUNTER — Other Ambulatory Visit: Payer: Self-pay

## 2010-04-18 LAB — GLUCOSE, CAPILLARY: Glucose-Capillary: 151 mg/dL — ABNORMAL HIGH (ref 70–99)

## 2010-04-22 LAB — POCT I-STAT, CHEM 8
Calcium, Ion: 1.22 mmol/L (ref 1.12–1.32)
HCT: 39 % (ref 39.0–52.0)
Hemoglobin: 13.3 g/dL (ref 13.0–17.0)
TCO2: 24 mmol/L (ref 0–100)

## 2010-04-22 LAB — GLUCOSE, CAPILLARY: Glucose-Capillary: 159 mg/dL — ABNORMAL HIGH (ref 70–99)

## 2010-04-26 ENCOUNTER — Other Ambulatory Visit: Payer: Self-pay | Admitting: Cardiovascular Disease

## 2010-04-26 ENCOUNTER — Other Ambulatory Visit (INDEPENDENT_AMBULATORY_CARE_PROVIDER_SITE_OTHER): Payer: Medicare Other | Admitting: *Deleted

## 2010-04-26 DIAGNOSIS — R0789 Other chest pain: Secondary | ICD-10-CM

## 2010-04-26 DIAGNOSIS — E7800 Pure hypercholesterolemia, unspecified: Secondary | ICD-10-CM

## 2010-04-26 DIAGNOSIS — Z79899 Other long term (current) drug therapy: Secondary | ICD-10-CM

## 2010-04-27 LAB — COMPREHENSIVE METABOLIC PANEL
AST: 15 U/L (ref 0–37)
Alkaline Phosphatase: 74 U/L (ref 39–117)
BUN: 20 mg/dL (ref 6–23)
Creat: 1.33 mg/dL (ref 0.40–1.50)
Glucose, Bld: 129 mg/dL — ABNORMAL HIGH (ref 70–99)
Total Bilirubin: 0.6 mg/dL (ref 0.3–1.2)

## 2010-04-27 LAB — LIPID PANEL
HDL: 36 mg/dL — ABNORMAL LOW (ref >39)
LDL Cholesterol: 62 mg/dL (ref 0–99)
Total CHOL/HDL Ratio: 3.1 Ratio
Triglycerides: 69 mg/dL (ref <150)

## 2010-04-28 LAB — GLUCOSE, CAPILLARY: Glucose-Capillary: 142 mg/dL — ABNORMAL HIGH (ref 70–99)

## 2010-04-29 NOTE — Progress Notes (Signed)
PT NOTIFIED WITH LABS.

## 2010-05-10 LAB — LIPID PANEL
HDL: 35 mg/dL — ABNORMAL LOW (ref >39)
LDL Cholesterol: 65 mg/dL (ref 0–99)
Total CHOL/HDL Ratio: 3.2 RATIO
Triglycerides: 55 mg/dL (ref <150)
VLDL: 11 mg/dL (ref 0–40)

## 2010-05-10 LAB — GLUCOSE, CAPILLARY
Glucose-Capillary: 108 mg/dL — ABNORMAL HIGH (ref 70–99)
Glucose-Capillary: 128 mg/dL — ABNORMAL HIGH (ref 70–99)
Glucose-Capillary: 139 mg/dL — ABNORMAL HIGH (ref 70–99)
Glucose-Capillary: 143 mg/dL — ABNORMAL HIGH (ref 70–99)
Glucose-Capillary: 178 mg/dL — ABNORMAL HIGH (ref 70–99)
Glucose-Capillary: 233 mg/dL — ABNORMAL HIGH (ref 70–99)
Glucose-Capillary: 248 mg/dL — ABNORMAL HIGH (ref 70–99)
Glucose-Capillary: 249 mg/dL — ABNORMAL HIGH (ref 70–99)

## 2010-05-10 LAB — CBC
HCT: 40.2 % (ref 39.0–52.0)
HCT: 42.1 % (ref 39.0–52.0)
Hemoglobin: 12.3 g/dL — ABNORMAL LOW (ref 13.0–17.0)
Hemoglobin: 13.9 g/dL (ref 13.0–17.0)
Hemoglobin: 14.5 g/dL (ref 13.0–17.0)
MCHC: 33.5 g/dL (ref 30.0–36.0)
MCHC: 33.7 g/dL (ref 30.0–36.0)
MCHC: 34.5 g/dL (ref 30.0–36.0)
MCV: 90.2 fL (ref 78.0–100.0)
MCV: 90.5 fL (ref 78.0–100.0)
RBC: 4.05 MIL/uL — ABNORMAL LOW (ref 4.22–5.81)
RBC: 4.45 MIL/uL (ref 4.22–5.81)
RBC: 4.66 MIL/uL (ref 4.22–5.81)
RDW: 13.5 % (ref 11.5–15.5)
RDW: 13.6 % (ref 11.5–15.5)
WBC: 13.4 10*3/uL — ABNORMAL HIGH (ref 4.0–10.5)

## 2010-05-10 LAB — HEPARIN LEVEL (UNFRACTIONATED): Heparin Unfractionated: <0.1 IU/mL — ABNORMAL LOW (ref 0.30–0.70)

## 2010-05-10 LAB — COMPREHENSIVE METABOLIC PANEL
ALT: 34 U/L (ref 0–53)
Albumin: 3.7 g/dL (ref 3.5–5.2)
Alkaline Phosphatase: 72 U/L (ref 39–117)
Potassium: 4.4 mEq/L (ref 3.5–5.1)
Sodium: 139 mEq/L (ref 135–145)
Total Protein: 7.1 g/dL (ref 6.0–8.3)

## 2010-05-10 LAB — CK TOTAL AND CKMB (NOT AT ARMC)
CK, MB: 56.3 ng/mL — ABNORMAL HIGH (ref 0.3–4.0)
Relative Index: 7.1 — ABNORMAL HIGH (ref 0.0–2.5)
Total CK: 1194 U/L — ABNORMAL HIGH (ref 7–232)
Total CK: 672 U/L — ABNORMAL HIGH (ref 7–232)

## 2010-05-10 LAB — HEMOGLOBIN A1C: Mean Plasma Glucose: 169 mg/dL

## 2010-05-10 LAB — TROPONIN I: Troponin I: 19.97 ng/mL (ref 0.00–0.06)

## 2010-05-10 LAB — APTT: aPTT: 33 seconds (ref 24–37)

## 2010-05-12 LAB — GLUCOSE, CAPILLARY: Glucose-Capillary: 133 mg/dL — ABNORMAL HIGH (ref 70–99)

## 2010-05-14 LAB — CBC
HCT: 38.2 % — ABNORMAL LOW (ref 39.0–52.0)
Hemoglobin: 13.1 g/dL (ref 13.0–17.0)
MCHC: 34.2 g/dL (ref 30.0–36.0)
Platelets: 209 10*3/uL (ref 150–400)
RDW: 13.4 % (ref 11.5–15.5)

## 2010-05-14 LAB — GLUCOSE, CAPILLARY
Glucose-Capillary: 233 mg/dL — ABNORMAL HIGH (ref 70–99)
Glucose-Capillary: 85 mg/dL (ref 70–99)

## 2010-05-14 LAB — BASIC METABOLIC PANEL
BUN: 9 mg/dL (ref 6–23)
CO2: 28 mEq/L (ref 19–32)
GFR calc non Af Amer: >60 mL/min (ref >60)
Glucose, Bld: 176 mg/dL — ABNORMAL HIGH (ref 70–99)
Potassium: 4.3 mEq/L (ref 3.5–5.1)
Sodium: 139 mEq/L (ref 135–145)

## 2010-05-16 ENCOUNTER — Encounter: Payer: Self-pay | Admitting: Internal Medicine

## 2010-05-16 ENCOUNTER — Ambulatory Visit (INDEPENDENT_AMBULATORY_CARE_PROVIDER_SITE_OTHER): Payer: Medicare Other | Admitting: Internal Medicine

## 2010-05-16 VITALS — BP 145/80 | HR 65 | Temp 97.7°F | Ht 74.0 in | Wt 254.9 lb

## 2010-05-16 DIAGNOSIS — K5904 Chronic idiopathic constipation: Secondary | ICD-10-CM

## 2010-05-16 DIAGNOSIS — E119 Type 2 diabetes mellitus without complications: Secondary | ICD-10-CM

## 2010-05-16 DIAGNOSIS — I10 Essential (primary) hypertension: Secondary | ICD-10-CM

## 2010-05-16 DIAGNOSIS — I2510 Atherosclerotic heart disease of native coronary artery without angina pectoris: Secondary | ICD-10-CM

## 2010-05-16 DIAGNOSIS — K5909 Other constipation: Secondary | ICD-10-CM

## 2010-05-16 MED ORDER — SENNOSIDES-DOCUSATE SODIUM 8.6-50 MG PO TABS: 2.0000 | ORAL_TABLET | Freq: Every day | ORAL | Status: DC | PRN

## 2010-05-16 MED ORDER — DISPOSABLE ENEMA 19-7 GM/118ML RE ENEM: 1.0000 | ENEMA | Freq: Once | RECTAL | Status: AC

## 2010-05-16 NOTE — Assessment & Plan Note (Signed)
Have discussed his hypertension medications in detail. For some reason normal amlodipine 10 mg once a day is missing in his medication box. He is only taking clonidine 1 tablet 0.1 mg twice a day instead of 2 tablets twice a day. His lisinopril dose is now for now doubled to 40 mg once a day from 20 mg once a day prescribed. Clonidine is known to cause constipation but is his chronic medication and I am not very clear if this is associated with constipation for this patient. Continue current regimen which is now altered to what patient thinks it should be. Follow up in one month with Dr. Alona Bene for further clarification.

## 2010-05-16 NOTE — Assessment & Plan Note (Signed)
Continue current regimen his A1c has been under control. He states for A1c checked but hasn't been willing to do so today.

## 2010-05-16 NOTE — Progress Notes (Signed)
  Subjective:    Patient ID: [name] [name], male    DOB: [birth year], 77 y.o.   MRN: [id number]  HPI 77 year old no known past rectal history of carotid artery disease, hypertension, hypercholesterolemia and diabetes comes here with complaint of constipation. Patient reports that he has been having severe constipation for last month and half. Is unclear if this is related to any particular medication. He does endorse his decreased by mouth intake of water.  Patient has tried over-the-counter stool softener without much success. His last bowel movement was 3 days ago. He says at times his belly swells up in he hasn't passed stool for several days to go in and take over-the-counter medications I will help him pass stools. At this time he's not uncomfortable and does not complain of bowel pain nausea vomiting.  He is confused because several of his medications are changed by mail order pharmacy and others by his heart doctor. Cannot reconcile the medication list with confidence. Review of Systems  All other systems reviewed and are negative.       Objective:   Physical Exam  Constitutional: He is oriented to person, place, and time. He appears well-developed and well-nourished.  HENT:  Head: Normocephalic and atraumatic.  Right Ear: External ear normal.  Left Ear: External ear normal.  Eyes: Conjunctivae and EOM are normal. Pupils are equal, round, and reactive to light. Left eye exhibits no discharge.  Neck: Normal range of motion. Neck supple. No thyromegaly present.  Cardiovascular: Normal rate, regular rhythm and normal heart sounds.   No murmur heard. Pulmonary/Chest: Effort normal and breath sounds normal.  Abdominal: Soft. Bowel sounds are normal. He exhibits distension. There is no tenderness. There is no rebound and no guarding.  Musculoskeletal: Normal range of motion.  Neurological: He is alert and oriented to person, place, and time. He has normal reflexes.  Psychiatric: He  has a normal mood and affect. His behavior is normal. Thought content normal.          Assessment & Plan:

## 2010-05-16 NOTE — Patient Instructions (Signed)
  Drink lots of water through the day Use fleet enema if no releif in 2-3 days.

## 2010-05-16 NOTE — Assessment & Plan Note (Signed)
I have advised him to consume more water throughout the day. I also given a prescription for some stool softener along with stimulant. If this does not help with oral tablet and I would give him Fleet enema. Patient will followup if he developed nausea vomiting. Her blood rest is question further with Dr. Augusto Garbe next visit.  If this was to be compared by blood in stool or diarrhea one should also consider possibility of colon cancer. He has had colonoscopy in recent past. This has not shown any other abnormality that would be suspicious for cancer.  The other possibility would be diabetes neuropathy and colonic inertia. This is unlikely given that this has only manifested over last 6 weeks. Continue to follow in the clinic

## 2010-05-17 MED ORDER — INSULIN LISPRO 100 UNIT/ML ~~LOC~~ SOLN: 10.0000 [IU] | Freq: Three times a day (TID) | SUBCUTANEOUS | Status: DC

## 2010-05-17 MED ORDER — ATORVASTATIN CALCIUM 20 MG PO TABS: 20.0000 mg | ORAL_TABLET | Freq: Every day | ORAL | Status: DC

## 2010-05-17 MED ORDER — AMLODIPINE BESYLATE 10 MG PO TABS: 10.0000 mg | ORAL_TABLET | Freq: Every day | ORAL | Status: DC

## 2010-05-17 MED ORDER — METFORMIN HCL 500 MG PO TABS: ORAL_TABLET | ORAL | Status: DC

## 2010-05-17 MED ORDER — CLONIDINE HCL 0.1 MG PO TABS: 0.1000 mg | ORAL_TABLET | Freq: Two times a day (BID) | ORAL | Status: DC

## 2010-05-17 MED ORDER — LISINOPRIL 40 MG PO TABS: 20.0000 mg | ORAL_TABLET | Freq: Every day | ORAL | Status: DC

## 2010-05-17 MED ORDER — METOPROLOL TARTRATE 25 MG PO TABS: 25.0000 mg | ORAL_TABLET | Freq: Two times a day (BID) | ORAL | Status: DC

## 2010-05-17 MED ORDER — INSULIN GLARGINE 100 UNIT/ML ~~LOC~~ SOLN: 25.0000 [IU] | Freq: Every day | SUBCUTANEOUS | Status: DC

## 2010-05-17 MED ORDER — HYDROCHLOROTHIAZIDE 25 MG PO TABS: 25.0000 mg | ORAL_TABLET | Freq: Every day | ORAL | Status: DC

## 2010-05-17 NOTE — Progress Notes (Signed)
Addended byAllene Dillon on: 05/17/2010 03:00 PM   Modules accepted: Orders

## 2010-06-18 NOTE — Discharge Summary (Signed)
NAMETOLLIE, [place]          ACCOUNT NO.:  [id number]   MEDICAL RECORD NO.:  [id number]          PATIENT TYPE:  INP   LOCATION:  2506                         FACILITY:  [facility]   PHYSICIAN:  Vesta Mixer, M.D. DATE OF BIRTH:  Aug 23, 1931   DATE OF ADMISSION:  06/14/2008  DATE OF DISCHARGE:  06/15/2008                               DISCHARGE SUMMARY   DISCHARGE DIAGNOSES:  1. Coronary artery disease - status post percutaneous transluminal      coronary angioplasty and stenting of the left circumflex marginal      vessel.  2. Diabetes mellitus.  3. Hypertension.  4. Hyperlipidemia.   DISCHARGE MEDICATIONS:  1. Aspirin 81 mg a day.  2. Plavix 75 mg a day.  3. Metformin 500 mg a day starting Friday.  4. Allegra 60 mg twice a day.  5. Hydrochlorothiazide 25 mg a day.  6. Lovastatin 40 mg a day.  7. Lisinopril 20 mg twice a day.  8. Metoprolol 25 mg twice a day.  9. Lantus insulin 40 units in the morning.  10.Humalog insulin 12 units with meals.  11.Nitroglycerin 0.4 grams sublingually as needed for chest pain.   DISPOSITION:  The patient will see Dr. Elease Hashimoto in 1-2 weeks.  He is to  see his general medical doctor for other medical problems and for his  diabetes.   HISTORY:  Mr. Dentinger is a 77 year old gentleman with a history of  coronary artery disease.  He recently had a diagnostic heart  catheterization that revealed a tight stenosis in his obtuse marginal  artery.  He is admitted for percutaneous intervention.   HOSPITAL COURSE BY PROBLEMS:  Coronary artery disease.  The patient  underwent heart catheterization.  There was found to have an 90%  stenosis in the first obtuse marginal artery.  We placed a Xience 2.5 x  28 mm stent across the stenosis.  It was inflated up to 12 atmospheres.  Post-stent dilatation was achieved using a 2.75 mm noncompliant Quantum  apex in the distal aspect of the stent.  This was inflated up to 12  atmospheres.  We then took  a 3.0 x  12 mm [place] Quantum apex to the proximal edge of the stent and  inflated up to 12 atmospheres.  This gives a very nice angiographic  result with 0% residual stenosis.  He had brisk flow and tolerated the  procedure quite well.  All of his other medical problems are stable and  he will be addressed as an outpatient.      Vesta Mixer, M.D.  Electronically Signed     PJN/MEDQ  D:  06/15/2008  T:  06/15/2008  Job:  [id number]

## 2010-06-18 NOTE — Assessment & Plan Note (Signed)
OFFICE VISIT   [name] [name], [name] [name]  DOB:  03/10/1931                                       06/25/2009  EAVWU#:[id number]   REASON FOR VISIT:  Claudication, left greater than right.   REFERRING PHYSICIANS:  Drs. Joines and Mudlogger.   HISTORY:  This is a 77 year old gentleman I am seeing at request of Dr.  Meredith Pel and Dr. Elease Hashimoto for evaluation of claudication.  The patient  states that he gets cramping in his legs with walking approximately 1  block.  This is lifestyle-limiting for him.  He describes it as a pain  in his thigh and in his calf that resolves with rest.  He does not have  rest pain.  He does not have any nonhealing wounds.  This has been  getting progressively worse over time.   The patient has a history of coronary artery disease, having had an MI  in the past.  He has also had stenting of his coronary arteries in the  past.  The patient suffers from diabetes, which has been well-  controlled.  His last hemoglobin A1c was in the 6 range.  He is also  medically managed for his hypertension and hyperlipidemia.  He has a  history of smoking but quit 20 years ago.   REVIEW OF SYSTEMS:  GENERAL:  Negative for fevers, chills, weight gain,  weight loss.  CARDIAC:  Positive for chest pain.  PULMONARY:  Negative for shortness of breath.  GI:  Negative for blood in the stool.  GU: Negative.  NEUROLOGIC:  No history of stroke.  VASCULAR:  As above.  MUSCULOSKELETAL:  Positive for arthritis.  PSYCH:  Negative.  EENT:  Negative.  HEMATOLOGIC: Negative for bleeding problems.  SKIN:  Negative for rash.   PAST MEDICAL HISTORY:  Diabetes, hypertension, hypercholesterolemia,  coronary artery disease status post MI.   FAMILY HISTORY:  Father died at age 61 due to brain hemorrhage.  Mother  died at 44 due to kidney failure.   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   Negative for  alcohol.   MEDICATIONS:  Aspirin 81 mg per day, Plavix 75 mg per day, Humalog 12  units subcutaneous with meals, Lantus 40 units per day, nitroglycerin  sublingual as needed, rosuvastatin 10 mg q.h.s., amlodipine 10 mg per  day, clonidine 0.1 mg b.i.d., fexofenadine 60 mg per day,  hydrochlorothiazide 25 mg per day, lisinopril 40 mg per day, metformin  500 mg 4 times a day, metoprolol 25 mg per day.   ALLERGIES:  None.   PHYSICAL EXAMINATION:  Vital signs:  Heart rate 66, blood pressure  117/74, temperature is 97.4.  General:  He is well-appearing, in no  distress.  HEENT:  Within normal limits.  Lungs are clear bilaterally  without wheezes or rhonchi.  Cardiovascular is a regular rate and  rhythm.  No carotid bruits.  He is equal, palpable femoral pulses.  Pedal pulses are not palpable.  Abdomen is soft, nontender.  Musculoskeletal is without major deformities.  No nonhealing wounds.  No  edema.  Neurologic:  He has a nonfocal exam.  Skin is without rash.   DIAGNOSTIC STUDIES:  The patient comes with ankle-brachial indices  performed at [facility] in late March, which reveal an  ABI of 0.78 on the  right and 0.66 on the left.   ASSESSMENT/PLAN:  Lifestyle-limiting claudication, left greater than  right.   PLAN:  I discussed medical options versus percutaneous versus surgical  options with the patient.  With his history of congestive heart failure,  he is not a good candidate for cilostazol.  Since this is a lifestyle-  limiting problem for him, we discussed proceeding with an arteriogram to  define his anatomy and possibly intervene at that time.  I discussed the  risks and benefits of percutaneous intervention, including bleeding  risk, risk of surgical intervention.  The patient states that his  symptoms are such that he wishes to proceed.  Plan will be for  arteriogram with bilateral runoff from access on the right side with  possible intervention on the left to be  done on Tuesday, June 7.     Jorge Ny, MD  Electronically Signed   VWB/MEDQ  D:  06/25/2009  T:  06/26/2009  Job:  [id number]   cc:   Dr. Meredith Pel  Dr. Elease Hashimoto

## 2010-06-18 NOTE — Cardiovascular Report (Signed)
NAME:  [name] [name], [name] [name] NO.:  [id number]   MEDICAL RECORD NO.:  [id number]          PATIENT TYPE:  OIB   LOCATION:  1963                         FACILITY:  [facility]   PHYSICIAN:  Vesta Mixer, M.D. DATE OF BIRTH:  1931-08-04   DATE OF PROCEDURE:  06/09/2008  DATE OF DISCHARGE:                            CARDIAC CATHETERIZATION   [name] [name] is a 77 year old gentleman with a history of coronary  artery disease, diabetes mellitus, hypertension, and hyperlipidemia.  He  recently presented with a prolonged episode of chest pain.  He presented  a week after his episodes of chest pain and they had largely resolved.  His Cardiolite scan revealed the presence of a lateral infarct with peri-  infarct ischemia.  He is scheduled today for diagnostic heart  catheterization.   The procedure was left heart catheterization with coronary angiography.   The right femoral artery was easily cannulated using a modified  Seldinger technique.   HEMODYNAMICS:  LV pressure is 150/19 with an aortic pressure of 149/73.   ANGIOGRAPHY:  Left main:  The left main has minor luminal  irregularities.   The left anterior descending artery has a mid 40-50% stenosis that is  fairly long.  The first diagonal artery is quite small, but has a 90%  proximal stenosis.  It is way too small to consider angioplasty.  The  second diagonal is also fairly small.  The terminal left anterior  descending artery had a tight 90% stenosis, but it is clearly far out on  the apex.   Left circumflex artery is a moderate-to-large vessel.  There are  moderate irregularities in the proximal segment.  The circumflex artery  is occluded after giving off the first obtuse marginal artery.  The  distal circumflex fills slowly via left-to-left collaterals.   The first obtuse marginal artery itself has 60% long stenosis followed  by a tight 90% stenosis.  This site is a good site for angioplasty.   The right  coronary artery is large and dominant.  There are minor  luminal irregularities in the proximal segment.  The mid vessel has a  hazy 80% stenosis.  This lesion is also a good site for potential  angioplasty.  The posterior descending artery and the posterolateral  segment artery have minor luminal irregularities.   The left ventriculogram was performed in 30-degree RAO position.  It  reveals mildly depressed left ventricular systolic function.  Ejection  fraction is 45-50%.  There is lateral wall and inferolateral akinesis.  The remaining areas of the heart seem to contract fairly well.   COMPLICATIONS:  None.   CONCLUSION:  1. Diffuse three-vessel coronary artery disease, but it is tight      stenosis in the left circumflex artery and right coronary artery.      He is diabetic but his left anterior descending artery is not all      that critical and I do not think      that we need to consider bypass at this time.  We will plan in for      stent procedure of his left circumflex marginal vessel  and the mid      right coronary artery.  The schedule this for next week.  We will      start him on Plavix.      Vesta Mixer, M.D.  Electronically Signed     PJN/MEDQ  D:  06/09/2008  T:  06/10/2008  Job:  [id number]   cc:   Ileana Roup, M.D.

## 2010-06-18 NOTE — Cardiovascular Report (Signed)
NAMEEDD, REPPERT NO.:  [id number]   MEDICAL RECORD NO.:  [id number]          PATIENT TYPE:  INP   LOCATION:  2917                         FACILITY:  [facility]   PHYSICIAN:  Vesta Mixer, M.D. DATE OF BIRTH:  05-Feb-1931   DATE OF PROCEDURE:  10/05/2008  DATE OF DISCHARGE:                            CARDIAC CATHETERIZATION   SURGEON:  Vesta Mixer, MD   PROCEDURE:  Left heart catheterization with coronary angiography with  percutaneous transluminal coronary angioplasty and stenting of his first  obtuse marginal artery.   The right femoral artery was cannulated using the assistance of a Smart  needle.   HEMODYNAMIC RESULTS:  LV pressure was 131/19.  His aortic pressure was  131/66.   ANGIOGRAPHY:  Left main:  The left main has a distal stenosis.  In some  views, it appears to be only minimal but in other views it appears to be  40%.   The left anterior descending artery has moderate diffuse disease between  30% throughout its course.  There is an apical 90% stenosis.  There are  several small diagonal arteries that have stenosis at their takeoff.  They are diffusely diseased and not candidates for angioplasty.   The left circumflex artery is a large vessel.  It gives off a large  obtuse marginal artery that is 100% occluded at the site of the stent.  The distal circumflex artery continues around the AV groove and is  subtotally occluded.  This is unchanged from previous studies.   The right coronary artery is large and is dominant.  It is fairly  tortuous.  There are mild-to-moderate irregularities.  The posterior  descending artery and the posterolateral segment artery are  unremarkable.   The left ventriculogram was performed in the 30-degree RAO position.  It  reveals hypokinesis of the lateral wall and ejection fraction was 50-  55%.   The patient had been on Angiomax through the night.  Beginning ACT was  201.  We re-bolused and his ACT  was 373.   TECHNIQUE:  A Judkins left 4 and a Prowater guide were positioned down  into the circumflex marginal vessel.  This overall gave [place] fairly poor  backup.  We were able to make two passes with [place] catheter.  We were  not able to obtain any thrombus.  At this point, we traded out for an XB  3.5 guide.  An [facility] wire was positioned down into the  vessel.  We used a 2.0 x 12 mm apex.  We made a total of 6 inflations  ranging between 4 and 10 atmospheres for 15 seconds each.  This opened  up the vessel and gave [place] some flow.  At this point, a 3.0 x 15 mm  noncompliant Voyager was positioned.  We went down to the distal aspect  of the stent and inflated it up to 12 atmospheres for 30 seconds.  We  repeated this 12 atmospheres inflation distally.  We then pulled the  catheter back  and inflated it up to 10 atmospheres in the mid stent and then  eventually 16 atmospheres  in the proximal edge of the stent for 50  seconds.  This gave [place] a very nice angiographic result.  There were  still some slight irregularities but there was good flow.  The patient  is stable and will be going back to the Step-Down Unit.      Vesta Mixer, M.D.  Electronically Signed     PJN/MEDQ  D:  10/05/2008  T:  10/06/2008  Job:  [id number]

## 2010-06-18 NOTE — Cardiovascular Report (Signed)
NAMEORIN, EBERWEIN NO.:  [id number]   MEDICAL RECORD NO.:  [id number]          PATIENT TYPE:  INP   LOCATION:  2506                         FACILITY:  [facility]   PHYSICIAN:  Vesta Mixer, M.D. DATE OF BIRTH:  1931/12/14   DATE OF PROCEDURE:  06/14/2008  DATE OF DISCHARGE:                            CARDIAC CATHETERIZATION   [name] [name] is a 77 year old gentleman with a history of coronary artery  disease.  He recently had episodes of chest pain.  A Cardiolite study  was found to have an abnormality in the lateral wall.  He had a  diagnostic heart catheterization, which revealed a completely occluded  distal circumflex artery (this is known from previous cath).  He also  had a 90% stenosis in the first obtuse marginal artery.  He was brought  back today for PTCA and stenting of this vessel.   PROCEDURE:  Percutaneous transluminal coronary angioplasty and stenting  of the left circumflex artery.   The right femoral artery was easily cannulated using a modified  Seldinger technique.  Angiomax was given.  ACT was 338.  The left main  was engaged using a 6-French Judkins left 4.5 guide.  A Prowater  angioplasty wire was placed down into the first obtuse marginal artery.   A 2.25- x 20-mm apex balloon was passed across the stenosis.  It was  inflated twice up to 6 atmospheres for 30 seconds each.  Following this,  a Xience stent was positioned across the stenosis.  There was some  tortuosity and residual lesion, which made this slightly difficult.  The  Xience was a 2.5- x 28-mm stent.  It was deployed at 12 atmospheres for  25 seconds.   Following this, post-stent dilatation was achieved using a 2.75-mm x 20-  mm [place] Quantum apex.  It was positioned in the distal aspect of the  stent.  It was inflated up to 12 atmospheres for 30 seconds.   Following this, a 3.0- x 12-mm [place] Quantum apex balloon was positioned in  proximal edge of the stent.  It was inflated up  to 12 atmospheres for 20  seconds.  It was then moved down slightly distally and inflated again to  12 atmospheres for 20 seconds.  This gave [place] a very nice angiographic  result.  There is 0% residual stenosis.  There was no evidence of edge  dissection.   The patient is stable leaving the lab.   COMPLICATIONS:  None.   CONCLUSION:  Successful percutaneous transluminal coronary angioplasty  and stenting of the left circumflex marginal vessel.      Vesta Mixer, M.D.  Electronically Signed    PJN/MEDQ  D:  06/14/2008  T:  06/15/2008  Job:  [id number]

## 2010-06-18 NOTE — H&P (Signed)
NAME:  [name] [name], [name] [name] NO.:  [id number]   MEDICAL RECORD NO.:  [id number]            PATIENT TYPE:   LOCATION:                                 FACILITY:   PHYSICIAN:  Vesta Mixer, M.D.      DATE OF BIRTH:   DATE OF ADMISSION:  DATE OF DISCHARGE:                              HISTORY & PHYSICAL   [name] [name] is an elderly gentleman with a history of coronary artery  disease, diabetes mellitus, hypertension.  He is admitted for heart  catheterization after having an episode of chest pain and having an  abnormal stress Cardiolite study.   [name] [name] has known coronary artery disease.  He had a heart  catheterization back in 2002 which revealed an occluded left circumflex  marginal vessel.  He had good collateral filling of that vessel.  He had  a stress Cardiolite study performed last year which revealed no  significant ischemia.   A stress Cardiolite study performed yesterday revealed the presence of  the lateral wall myocardial infarction with a small amount of  reversibility.   [name] [name] has a history of some chest pains 2 or 3 weeks ago.  The  chest pain lasted for about a week.  It was described as a pain that  radiate through to his shoulder blades.  The pain occurred with very  little exertion.  The pain lasted between 30 minutes and 1 hour.  He  denied any nausea or vomiting, but did have some episodes of  indigestion.  He denied any diaphoresis.  He noted that he had chest  pain every time that he would mow the lawn.  He has not had any  recurrent episodes of chest pain for the past week or so and it seems to  be feeling a little bit better.  He denies any syncope or presyncope.  He denies any PND or orthopnea.  He denies any cough or sputum  production.  He denies any fevers, chills, weight gain or weight loss,  change in his bowel habits.  He denies any hematuria or blood in his  stool.   CURRENT MEDICATIONS:  1. Metformin 500 mg twice a  day.  2. Fexofenadine 60 mg twice a day.  3. Amlodipine 10 mg a day.  4. Hydrochlorothiazide 25 mg a day.  5. Lovastatin 40 mg a day.  6. Lisinopril twice a day.  7. Metoprolol twice a day.  8. Novolin 70/30 each day as directed by Dr. Meredith Pel.  9. Lantus insulin and Humalog insulin as directed by Dr. Meredith Pel.  10.Aspirin 81 mg a day.   ALLERGIES:  None.   PAST MEDICAL HISTORY:  1. History of coronary artery disease.  He has a known occlusion of      his left circumflex marginal vessel.  He had good collateral      filling by heart catheterization in 2002.  2. Diabetes mellitus.  3. Hypertension.  4. Dyslipidemia.   FAMILY HISTORY:  His father died at age 58 due to a brain hemorrhage.  His mother died at age 76 due to kidney failure.  SOCIAL HISTORY:  The patient works as a Copy at [employer].  He does not smoke and does not drink alcohol.  He does not get any regular exercise.   His review of systems is reviewed in the HPI.  All other systems were  reviewed and are negative.   PHYSICAL EXAMINATION:  GENERAL:  He is an elderly gentleman in no acute  distress.  He is alert and oriented x3, and his mood and affect are  normal.  His weight is 264, blood pressure is 150/90 with a heart rate  of 72.  HEENT:  The sclerae are nonicteric.  The mucous membranes are moist.  NECK:  Supple.  His carotids are 2+ without bruits.  There is no JVD, no  thyromegaly.  LUNGS:  Clear.  BACK:  Nontender.  HEART:  Regular rate.  S1 and S2.  His PMI is nondisplaced.  His chest  wall is nontender.  ABDOMEN:  Good bowel sounds.  There is no hepatosplenomegaly.  There is  no guarding or rebound.  EXTREMITIES:  No clubbing, cyanosis, or edema.  His pulses are only  trace.  There is no rash.  He has no edema.  His gait is normal.  NEUROLOGIC:  Nonfocal.   His EKG from last week reveals normal sinus rhythm.  He has no ST or T-  wave changes.   Review of the stress  Cardiolite study reveals that he has evidence of a  lateral scar.  There is some degree of reversibility.   [name] [name] presents with episodes of chest pain.  I suspect that he has  had a lateral wall myocardial infarction.  He appears to also has some  evidence of peri-infarct ischemia.  We have discussed the possibility of  doing a heart catheterization.  We have talked about the risks,  benefits, and options concerning cath.  He understands and agrees to  proceed.  We will schedule the cath for Friday.  The patient does have a  prescription for nitroglycerin.  All of his other medical problems  remain stable.      Vesta Mixer, M.D.  Electronically Signed     PJN/MEDQ  D:  06/06/2008  T:  06/07/2008  Job:  [id number]   cc:   Ileana Roup, M.D.

## 2010-06-18 NOTE — H&P (Signed)
NAMEMAXFIELD, GILDERSLEEVE NO.:  [id number]   MEDICAL RECORD NO.:  [id number]          PATIENT TYPE:  INP   LOCATION:  2917                         FACILITY:  [facility]   PHYSICIAN:  Vesta Mixer, M.D. DATE OF BIRTH:  May 22, 1931   DATE OF ADMISSION:  10/04/2008  DATE OF DISCHARGE:                              HISTORY & PHYSICAL   [name] [name] is an elderly gentleman with a history of coronary  artery disease, diabetes mellitus, and hypertension.  He is admitted now  to the hospital with symptoms of unstable angina.   [name] [name] has a history of coronary artery disease.  He has a known  occlusion of his distal circumflex artery.  He is status post PTCA and  stenting of his first obtuse marginal artery last May.  He overall has  done fairly well, but started having episodes of chest pain last week  when he was mowing his lawn.  It occurred after a very hot day.  Since  that time, he has had intermittent episodes of chest pain.  These pains  typically get worse with exertion and relieved temporarily with rest.  The pain radiates up through his interscapular region and down through  his arms.  There was no associated diaphoresis.  He does have some  shortness of breath.  He is now admitted to the hospital with the above-  noted symptoms.   He denies any PND or orthopnea.   CURRENT MEDICATIONS:  1. Metformin 500 mg twice a day.  2. Allegra 60 mg twice a day.  3. Amlodipine 10 mg a day.  4. Hydrochlorothiazide 25 mg a day.  5. Lovastatin 40 mg a day.  6. Lisinopril 20 mg twice a day.  7. Metoprolol 25 mg twice a day.  8. Aspirin 81 mg a day.  9. Lantus insulin 40 units subcu each day.  10.Humalog 12 units with meals.  11.Plavix 75 mg a day.   ALLERGIES:  He has no known drug allergies.   PAST MEDICAL HISTORY:  1. Coronary artery disease.  He is status post PTCA and stenting of      his first obtuse marginal artery.  We placed a 2.5 x 28 mm XIENCE  stent in his circumflex artery.  It was postdilated using a 2.75      noncompliant Quantum in the distal aspect of the stent and then a      3.0-mm balloon in the proximal edge of the stent.  He had nice flow      following that angioplasty.  2. Diabetes mellitus followed by the Internal Medicine Service at      [facility].  3. Hypertension.  4. Dyslipidemia.   SOCIAL HISTORY:  The patient works as a Copy at [employer].  He does not smoke and does not drink alcohol.  He does  not get into regular exercise.   FAMILY HISTORY:  His father died at age 32 due to a brain hemorrhage.  His mother died at age 85 due to kidney failure.   REVIEW OF SYSTEMS:  Reviewed.  It was noted in the  HPI.  All other  systems were reviewed and are negative.   PHYSICAL EXAMINATION:  GENERAL:  He is an elderly gentleman, in no acute  distress.  He is alert and oriented x3, and his mood and affect are  normal.  VITAL SIGNS:  His weight is 259, his blood pressure is 138/80 with a  heart rate of 56.  HEENT:  2+ carotids.  He has no bruits.  There is no JVD.  His sclerae  nonicteric.  His mucous membranes are moist.  LUNGS:  Clear.  HEART: Regular rate, S1 and S2.  He has no murmurs.  His PMI is  nondisplaced.  ABDOMEN:  Good bowel sounds.  He has no hepatosplenomegaly.  There is no  guarding or rebound.  EXTREMITIES:  There is no clubbing, cyanosis, or edema.  His pulses are  1+.  He has no rash.  NEUROLOGIC:  Nonfocal.  His gait is normal.   His EKG reveals normal sinus rhythm.  He has very small Q-waves in leads  aVF as well as V6.  He also has T-wave inversions in lead aVF as well as  V4 through V6.  These are new from his previous tracing.   [name] [name] presents with symptoms consistent with unstable angina.  His  EKG has new T-wave inversions in the inferior lateral leads as well as  very small Q-waves.  I suspect that he may have restenosis of the stent.  He does have a  known occlusion of the distal circumflex artery, but his  EKG has changed since previous EKG in April.  We will admit him to the  hospital and start him on heparin and a nitro drip.  We may consider  starting him on Integrilin if he has more pain.  We gave him one squirt  of nitroglycerin here in the office, and his symptoms are basically  resolved.  We will anticipate doing a heart catheterization tomorrow  morning.  We have discussed the risks, benefits, and options of heart  catheterization.  He understands and agrees to proceed.  All of his  other medical problems are fairly stable.  We will check a lipid profile  in the morning.      Vesta Mixer, M.D.  Electronically Signed     PJN/MEDQ  D:  10/04/2008  T:  10/05/2008  Job:  [id number]   cc:   Ileana Roup, M.D.

## 2010-06-18 NOTE — Assessment & Plan Note (Signed)
OFFICE VISIT   KHUP, SAPIA  DOB:  1931/03/20                                       09/03/2009  ZOXWR#:[id number]   REASON FOR VISIT:  Follow-up claudication.   HISTORY:  This is a 77 year old gentleman that I saw in May for  bilateral lower extremity claudication at approximately one block.  He  has a history of congestive heart failure, therefore I was reluctant to  start him on cilostazol.  He underwent arteriogram on June 7.  This  revealed a left superficial femoral artery occlusion and occlusion of  all 3 tibial vessels, which did reconstitute in the mid calf on the  right.  There was tandem stenosis within the superficial femoral artery  with a proximal occlusion of all 3 tibial vessels and reconstitution of  the peroneal artery in the upper calf and the posterior tibial at the  left ankle.  No intervention was performed.  The patient comes back in  today for follow-up.  He states his legs have not been bothering him as  much as of late.  He denies chest pain or shortness of breath.   PHYSICAL EXAMINATION:  Heart rate 79, blood pressure 124/70, O2  saturation 97%.  General:  He is well appearing in no distress.  Respirations are not nonlabored.  Extremities:  I cannot palpate pedal  pulses.  No ulcers, no edema.   ASSESSMENT:  Bilateral claudication.   PLAN:  Based on the extent of disease that this patient suffers from I  would not recommend surgical revascularization at this time.  He would  require a femoral tibial bypass bilaterally, and I would not perform  this operation in the absence of resting or nonhealing wounds.  I have  discussed this with the patient.  He understands and I will plan on  seeing him back in 1 year.  He will call me sooner if he has symptoms.  I specifically told him that if he develops a sore that does not heal  within a week or two to contact me immediately.     Jorge Ny, MD  Electronically  Signed   VWB/MEDQ  D:  09/03/2009  T:  09/04/2009  Job:  [id number]   cc:   Ileana Roup, M.D.  Vesta Mixer, M.D.

## 2010-06-18 NOTE — H&P (Signed)
NAME:  TUCKER, MINTER NO.:  [id number]   MEDICAL RECORD NO.:  [id number]           PATIENT TYPE:   LOCATION:                                 FACILITY:   PHYSICIAN:  Vesta Mixer, M.D. DATE OF BIRTH:  05-May-1931   DATE OF ADMISSION:  DATE OF DISCHARGE:                              HISTORY & PHYSICAL   Beckham Capistran is an elderly gentleman with a history of known  coronary artery disease, diabetes mellitus, hypertension and a known  occlusion of his first obtuse marginal artery.  He recently had a heart  catheterization in the outpatient lab and was found to have a tight  stenosis involving his left circumflex vessel as well as his right  coronary artery.  He is now admitted for PCI of the left circumflex  vessel.   Mr. Kaminsky has presented with episodes of chest discomfort.  He had  chest discomfort for a week and a subsequent Cardiolite study revealed a  lateral wall myocardial infarction with a small amount of peri-infarct  ischemia.  The heart catheterization last week revealed a tight stenosis  in the left circumflex vessel as well as a moderately tight stenosis in  the right coronary artery.  He has not had nearly as much chest pain  since we have had him on medications.  He denies any heat or cold  intolerance.  He denies any weight gain or weight loss.  He denies any  cough or sputum production.  He has been on Plavix for several days and  denies any blood in his urine or blood in his stool.  All other systems  were reviewed and are negative.   His current medications are,  1. Metformin 500 mg p.o. b.i.d.  2. Fexofenadine 60 mg twice a day.  3. Amlodipine 10 mg a day.  4. HCTZ 25 mg a day.  5. Lovastatin 40 mg a day.  6. Lisinopril 20 mg a day.  7. Metoprolol twice a day.  8. Aspirin 81 mg a day.  9. Lantus insulin once a day.  10.Humalog insulin once a day.  11.Plavix 75 mg a day.   PAST MEDICAL HISTORY:  1. Coronary artery disease.  2. Hypercholesterolemia.  3. Hypertension.  4. Diabetes mellitus.   SOCIAL HISTORY:  The patient used to smoke but quit 30 years ago.  He  does not drink alcohol too excess.   FAMILY HISTORY:  His father died of a brain hemorrhage at age 68.  His  mother died at age 34 due to kidney failure.   His review of systems is reviewed in the HPI and otherwise is negative.   On exam, he is an elderly gentleman in no acute distress.  He is alert  and oriented x3, and his mood and affect are normal.  His weight is 261.  His blood pressure is 134/80 with a heart rate of 64.  His HEENT exam  reveals 2+ carotids.  There are no bruits.  His sclerae are nonicteric.  His mucous membranes are moist.  There is no thyromegaly.  There is no  JVD.  Lungs are  clear.  His back is nontender.  His heart, regular rate,  S1-S2.  His PMI is nondisplaced.  His chest wall is nontender.  His  abdominal exam reveals good bowel sounds and is nontender.  There is no  hepatosplenomegaly.  There is no guarding or rebound.  Extremities, he  has no clubbing, cyanosis or edema.  His cath site is well healed.   His neuro exam is nonfocal.  His gait is normal.   Mr. Sterkel presents with 2 known stenoses.  We started him on Plavix  last week.  He is having some difficulty in affording the Plavix.  We  may need to consider doing a non-drug-eluting stent, so that he would  not have to take Plavix for as long.  We will assess this further during  the time of the procedure.  We have discussed the risks, benefits and  options of heart catheterization.  He understands and agrees to proceed.      Vesta Mixer, M.D.  Electronically Signed     PJN/MEDQ  D:  06/12/2008  T:  06/13/2008  Job:  [id number]   cc:   Ileana Roup, M.D.

## 2010-06-21 NOTE — Cardiovascular Report (Signed)
[place]  Patient:    [name] [name], [name] [name]                   MRN: [id number] Proc. Date: 05/20/00 Adm. Date:  [id number] Disc. Date: [id number] Attending:  Koren Bound CC:         Cardiac Catheterization Lab             Elvina Sidle, M.D.                        Cardiac Catheterization  HISTORY:  [name] is a 77 year old black gentleman with a history of diabetes mellitus, hypercholesterolemia, and hypertension.  He has had some vague chest pain.  He recently had a stress test by Dr. Lowanda Foster which was abnormal.  He was referred for heart catheterization for further evaluation.  CARDIOLOGIST:  Alvia Grove., M.D.  PROCEDURE:  Cardiac catheterization and percutaneous transluminal coronary angioplasty attempt.  DESCRIPTION OF PROCEDURE:  The right femoral artery was easily cannulated using a modified Seldinger technique.  HEMODYNAMICS:  The left ventricular pressure was 146/18 with an aortic pressure of 146/80.  ANGIOGRAPHY:  The left main coronary artery is relatively smooth and normal.  The left anterior descending artery has mild to moderate irregularities throughout its course.  The range of stenosis is between 20 and 30%.  There are several diagonal branches which are small but have no critical stenoses.  The left circumflex artery is a fairly large vessel.  It is normal in the proximal segment.  The circumflex artery gives off a very large obtuse marginal artery and then is occluded.  The distal left circumflex artery fills very slowly via left-to-left collaterals.  The obtuse marginal artery has a 40 to 50% stenosis just as it originates from the left circumflex artery.  There are several 30% narrowings in the circumflex marginal vessel.  The right coronary artery is moderate size and is dominant.  There are mild to moderate irregularities.  There is a 30 to 40% stenosis in the mid segment. The posterior  descending artery and the posterolateral segment artery are quite small.  There are 40 to 50% stenosis in these branches.  LEFT VENTRICULOGRAM:  A left ventriculogram was performed in the 30 RAO position.  It reveals a mildly to moderately enlarged left ventricular.  The left ventricular systolic function is well preserved with an ejection fraction of 65%.  PTCA ATTEMPT:  The continuation branch from the left circumflex artery was thought to be relatively new.   The patient was given 5700 units of heparin. A 7-French Judkins left 4 guide was positioned.  A Cross-It 100 wire was placed down to the artery.  We could get just into the tip of the occluded left circumflex artery but could not cross the lesion.  We made multiple attempts.  We did not go to stiffer wires because of the possibility of coronary artery perforation.  COMPLICATIONS:  None.  CONCLUSIONS:   Moderate coronary artery disease with diffuse mild irregularities involving all three coronary arteries.  He has an occlusion of the distal aspect of the left circumflex artery.  This occlusion supplies a relatively small amount of the myocardium.  We will continue with aggressive medical therapy.  He does not have severe enough coronary artery disease at this point to warrant coronary artery bypass grafting. DD:  05/20/00 TD:  05/21/00 Job: [id number]

## 2010-06-21 NOTE — Discharge Summary (Signed)
[place]  Patient:    [name] [name], [name] [name]                   MRN: [id number] Adm. Date:  [id number] Disc. Date: [id number] Attending:  Koren Bound CC:         Caryl Comes. Slotnick, M.D.   Discharge Summary  DISCHARGE DIAGNOSES: 1. Coronary artery disease. 2. Diabetes mellitus. 3. Hypertension. 4. Hypercholesterolemia.  DISCHARGE MEDICATIONS: 1. Zocor 20 mg a day. 2. Aceon 4 mg a day. 3. GlucoVance 2.5 mg/500 mg twice a day. 4. Avandia 8 mg a day. 5. Toprol XL 25 mg a day. 6. Nitroglycerin 0.4 mg sublingually.  DISPOSITION:  The patient will return to see Alvia Grove., M.D., in one week.  He will return to see Caryl Comes. Slotnick, M.D., as needed.  HISTORY OF PRESENT ILLNESS:  [name] [name] is a 77 year old gentleman with a history of diabetes mellitus, hypercholesterolemia, and hypertension.  He was referred from Dr. Caryl Comes. Slotnicks for a positive treadmill.  HOSPITAL COURSE:  CORONARY ARTERY DISEASE:  The patient underwent heart catheterization.  He was found to have moderate coronary artery disease involving all coronary arteries.  He had a chronic total occlusion involving the distal left circumflex artery just after it gave off a large obtuse marginal artery.  An attempt was made to pass an angioplasty wire down this chronic total occlusion, but we were not successful.  The patient was admitted for overnight observation following this failed PTCA attempt.  He remained stable and did not have any EKG changes or pauses.  The patient will be continued on medical therapy.  We will add low-dose Toprol to his medical regimen and will give him sublingual nitroglycerin.  He does not have severe enough coronary artery disease to warrant coronary artery bypass grafting.  We will continue to follow him closely.  He will need to strictly adhere to his diet and exercise regimen. DD:  05/21/00 TD:  05/22/00 Job:  [id number]

## 2010-07-09 ENCOUNTER — Ambulatory Visit (INDEPENDENT_AMBULATORY_CARE_PROVIDER_SITE_OTHER): Payer: MEDICARE | Admitting: Cardiovascular Disease

## 2010-07-09 ENCOUNTER — Encounter: Payer: Self-pay | Admitting: Cardiovascular Disease

## 2010-07-09 ENCOUNTER — Encounter: Payer: Self-pay | Admitting: Internal Medicine

## 2010-07-09 DIAGNOSIS — I739 Peripheral vascular disease, unspecified: Secondary | ICD-10-CM

## 2010-07-09 DIAGNOSIS — I2510 Atherosclerotic heart disease of native coronary artery without angina pectoris: Secondary | ICD-10-CM

## 2010-07-09 DIAGNOSIS — I7389 Other specified peripheral vascular diseases: Secondary | ICD-10-CM

## 2010-07-09 NOTE — Assessment & Plan Note (Signed)
Will schedule repeat duplex scan at the office.

## 2010-07-09 NOTE — Assessment & Plan Note (Signed)
Continue with current meds.
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
## 2010-07-10 ENCOUNTER — Encounter: Payer: Self-pay | Admitting: Cardiovascular Disease

## 2010-07-10 ENCOUNTER — Ambulatory Visit: Payer: Medicare Other | Admitting: Internal Medicine

## 2010-07-18 ENCOUNTER — Other Ambulatory Visit: Payer: Self-pay | Admitting: Cardiovascular Disease

## 2010-07-18 DIAGNOSIS — I70219 Atherosclerosis of native arteries of extremities with intermittent claudication, unspecified extremity: Secondary | ICD-10-CM

## 2010-07-19 ENCOUNTER — Encounter (INDEPENDENT_AMBULATORY_CARE_PROVIDER_SITE_OTHER): Payer: MEDICARE | Admitting: *Deleted

## 2010-07-19 DIAGNOSIS — I70219 Atherosclerosis of native arteries of extremities with intermittent claudication, unspecified extremity: Secondary | ICD-10-CM

## 2010-07-19 DIAGNOSIS — I739 Peripheral vascular disease, unspecified: Secondary | ICD-10-CM

## 2010-07-22 ENCOUNTER — Encounter: Payer: Self-pay | Admitting: Cardiovascular Disease

## 2010-07-24 NOTE — Progress Notes (Signed)
Attempt to contant, no answer

## 2010-07-24 NOTE — Progress Notes (Signed)
Called no answer x2 

## 2010-07-25 NOTE — Progress Notes (Signed)
Informed of moderate disease and to let dr Ian Bushman know when he wants to proceed with any further action, pt wanting to "get by" with it as long as he can.

## 2010-09-09 ENCOUNTER — Ambulatory Visit: Payer: Self-pay | Admitting: Surgery

## 2010-10-11 ENCOUNTER — Encounter: Payer: Self-pay | Admitting: Surgery

## 2010-10-11 ENCOUNTER — Other Ambulatory Visit: Payer: Self-pay

## 2010-10-11 DIAGNOSIS — I70219 Atherosclerosis of native arteries of extremities with intermittent claudication, unspecified extremity: Secondary | ICD-10-CM

## 2010-10-14 ENCOUNTER — Ambulatory Visit: Payer: Self-pay | Admitting: Surgery

## 2010-11-05 LAB — GLUCOSE, CAPILLARY: Glucose-Capillary: 210 — ABNORMAL HIGH

## 2011-01-09 ENCOUNTER — Encounter: Payer: Self-pay | Admitting: Cardiovascular Disease

## 2011-01-09 ENCOUNTER — Ambulatory Visit (INDEPENDENT_AMBULATORY_CARE_PROVIDER_SITE_OTHER): Payer: MEDICARE | Admitting: Cardiovascular Disease

## 2011-01-09 DIAGNOSIS — R079 Chest pain, unspecified: Secondary | ICD-10-CM

## 2011-01-09 DIAGNOSIS — I2510 Atherosclerotic heart disease of native coronary artery without angina pectoris: Secondary | ICD-10-CM

## 2011-01-09 NOTE — Patient Instructions (Signed)
Your physician wants you to follow-up in: 6 months You will receive a reminder letter in the mail two months in advance. If you don't receive a letter, please call our office to schedule the follow-up appointment.  Your physician has requested that you have a lexiscan myoview. For further information please visit [email or web address] Please follow instruction sheet, as given.  Hold metoprolol for 24 hours prior to test. Take half a dose of insulin evening prior to test. Hold insulin and metformin day of test and take when able to eat. Test sugar level morning of test if to low take glucose gel or call office. Hold HCTZ day of test = water pill
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
## 2011-01-09 NOTE — Assessment & Plan Note (Addendum)
[name] [name] presents with some episodes of chest pain. I cannot tell whether this is due to reflux or perhaps unstable angina. We'll schedule him for a [employer] study.  It should be noted that he has had a fixed defect in the inferolateral wall on previous Myoview studies. I'll see him again in the office in 6 months. We'll see him sooner if needed.

## 2011-01-14 ENCOUNTER — Ambulatory Visit (HOSPITAL_COMMUNITY): Payer: Medicare Other | Attending: Cardiology | Admitting: Radiology

## 2011-01-14 DIAGNOSIS — I10 Essential (primary) hypertension: Secondary | ICD-10-CM | POA: Insufficient documentation

## 2011-01-14 DIAGNOSIS — I2510 Atherosclerotic heart disease of native coronary artery without angina pectoris: Secondary | ICD-10-CM

## 2011-01-14 DIAGNOSIS — E785 Hyperlipidemia, unspecified: Secondary | ICD-10-CM | POA: Insufficient documentation

## 2011-01-14 DIAGNOSIS — R079 Chest pain, unspecified: Secondary | ICD-10-CM

## 2011-01-14 DIAGNOSIS — R0789 Other chest pain: Secondary | ICD-10-CM | POA: Insufficient documentation

## 2011-01-14 DIAGNOSIS — E119 Type 2 diabetes mellitus without complications: Secondary | ICD-10-CM | POA: Insufficient documentation

## 2011-01-14 DIAGNOSIS — E669 Obesity, unspecified: Secondary | ICD-10-CM | POA: Insufficient documentation

## 2011-01-14 DIAGNOSIS — Z794 Long term (current) use of insulin: Secondary | ICD-10-CM | POA: Insufficient documentation

## 2011-01-14 DIAGNOSIS — I739 Peripheral vascular disease, unspecified: Secondary | ICD-10-CM | POA: Insufficient documentation

## 2011-01-14 DIAGNOSIS — Z87891 Personal history of nicotine dependence: Secondary | ICD-10-CM | POA: Insufficient documentation

## 2011-01-14 MED ORDER — REGADENOSON 0.4 MG/5ML IV SOLN
0.4000 mg | Freq: Once | INTRAVENOUS | Status: AC
  Administered 2011-01-14: 0.4 mg via INTRAVENOUS

## 2011-01-14 MED ORDER — TECHNETIUM TC 99M TETROFOSMIN IV KIT
33.0000 | PACK | Freq: Once | INTRAVENOUS | Status: AC | PRN
  Administered 2011-01-14: 33 via INTRAVENOUS

## 2011-01-14 MED ORDER — TECHNETIUM TC 99M TETROFOSMIN IV KIT
11.0000 | PACK | Freq: Once | INTRAVENOUS | Status: AC | PRN
  Administered 2011-01-14: 11 via INTRAVENOUS

## 2011-01-14 NOTE — Progress Notes (Signed)
[facility] SITE 3 NUCLEAR MED [address] [place] [address] [phone]  Cardiology Nuclear Med Study  [address] Hassing is a 77 y.o. male [id number] [birth year]   Nuclear Med Background Indication for Stress Test:  Evaluation for Ischemia and PTCA/Stent Patency  History:  '10 MPS:New LWMI with minimal ischemia>Cath>Stent-LAD Cardiac Risk Factors: History of Smoking, Hypertension, IDDM Type 2, Lipids, Obesity and PVD  Symptoms:  Chest Pressure with and without Exertion (last episode of chest discomfort was about one week ago)   Nuclear Pre-Procedure Caffeine/Decaff Intake:  None NPO After: 7:00pm   Lungs:  Clear.  O2 SAT 96% on RA. IV 0.9% NS with Angio Cath:  20g  IV Site: R Hand  IV Started by:  Stanton Kidney, EMT-P  Chest Size (in):  48 Cup Size: n/a  Height: 6\' 2"  (1.88 m)  Weight:  246 lb (111.585 kg)  BMI:  Body mass index is 31.58 kg/(m^2). Tech Comments:  Metoprolol held this am, per patient.    Nuclear Med Study 1 or 2 day study: 1 day  Stress Test Type:  Treadmill/Lexiscan  Reading MD: Willa Rough, MD  Order Authorizing Provider:  Kristeen Miss, MD  Resting Radionuclide: Technetium 19m Tetrofosmin  Resting Radionuclide Dose: 11.0 mCi   Stress Radionuclide:  Technetium 55m Tetrofosmin  Stress Radionuclide Dose: 33.0 mCi           Stress Protocol Rest HR: 61 Stress HR: 110  Rest BP: Sitting:126/79  Standing:115/74 Stress BP: 154/74  Exercise Time (min): 2:00 METS: n/a   Predicted Max HR: 143 bpm % Max HR: 76.92 bpm Rate Pressure Product: 21308   Dose of Adenosine (mg):  n/a Dose of Lexiscan: 0.4 mg  Dose of Atropine (mg): n/a Dose of Dobutamine: n/a mcg/kg/min (at max HR)  Stress Test Technologist: Smiley Houseman, CMA-N  Nuclear Technologist:  Domenic Polite, CNMT     Rest Procedure:  Myocardial perfusion imaging was performed at rest 45 minutes following the intravenous administration of Technetium 19m Tetrofosmin.  Rest ECG: No  acute changes, occasional PVC's.  Stress Procedure:  The patient received IV Lexiscan 0.4 mg over 15-seconds with concurrent low level exercise and then Technetium 7m Tetrofosmin was injected at 30-seconds while the patient continued walking one more minute.  There were no significant changes with Lexiscan.  Quantitative spect images were obtained after a 45-minute delay.  Stress ECG: No significant change from baseline ECG  QPS Raw Data Images:  Patient motion noted; appropriate software correction applied. Stress Images:  Moderate decreased activity in the lateral wall Rest Images:  Similar to stress Subtraction (SDS):  Mild reversibility in the lateral wall. Transient Ischemic Dilatation (Normal <1.22):  1.06 Lung/Heart Ratio (Normal <0.45):  0.38  Quantitative Gated Spect Images QGS EDV:  128 ml QGS ESV:  66 ml QGS cine images:  Some hypokinesis of the lateral and inferolateral walls. QGS EF: 48%  Impression Exercise Capacity:  Lexiscan with low level exercise. BP Response:  Normal blood pressure response. Clinical Symptoms:  stomach cramps. ECG Impression:  No significant ST segment change suggestive of ischemia. Comparison with Prior Nuclear Study: No significant change from previous study  Overall Impression:  No change since study of 06/2008. There is old scar affecting the lateral wall and parts of the anterolateral wall and parts of the inferolateral wall. There is mild peri-infarct ischemia.  Willa Rough, MD

## 2011-05-22 ENCOUNTER — Other Ambulatory Visit: Payer: Self-pay | Admitting: Internal Medicine

## 2011-05-22 NOTE — Telephone Encounter (Signed)
He's a pt of Dr Cyndia Bent per pt.

## 2011-05-22 NOTE — Telephone Encounter (Signed)
How do we know whether he is our patient?

## 2012-02-24 LAB — HM DIABETES EYE EXAM

## 2012-08-12 ENCOUNTER — Other Ambulatory Visit: Payer: Self-pay

## 2012-10-01 ENCOUNTER — Telehealth: Payer: Self-pay | Admitting: Endocrinology

## 2012-10-06 ENCOUNTER — Telehealth: Payer: Self-pay | Admitting: Endocrinology

## 2012-10-06 NOTE — Telephone Encounter (Signed)
Mail order pharmacy has called x 3 days to see if we have gotten a fax for diabetic supplies. Today is the first day we have been able to get a return call number. Please call back at [phone] / Roanna Raider

## 2012-10-11 ENCOUNTER — Other Ambulatory Visit (HOSPITAL_COMMUNITY): Payer: Self-pay | Admitting: Podiatry

## 2012-10-11 DIAGNOSIS — R0989 Other specified symptoms and signs involving the circulatory and respiratory systems: Secondary | ICD-10-CM

## 2012-10-12 ENCOUNTER — Ambulatory Visit (HOSPITAL_COMMUNITY)
Admission: RE | Admit: 2012-10-12 | Discharge: 2012-10-12 | Disposition: A | Discharge status: Home / Self Care | Payer: Medicare Other | Source: Ambulatory Visit | Attending: Cardiovascular Disease | Admitting: Cardiovascular Disease

## 2012-10-12 DIAGNOSIS — R0989 Other specified symptoms and signs involving the circulatory and respiratory systems: Secondary | ICD-10-CM | POA: Insufficient documentation

## 2012-10-12 NOTE — Progress Notes (Signed)
Lower Extremity Arterial Duplex Completed. Positive for 70-99% stenosis in the bilateral superficial femoral arteries. Brianna L Mazza,RVT

## 2012-11-03 ENCOUNTER — Ambulatory Visit: Payer: Medicare Other | Admitting: Cardiovascular Disease

## 2012-11-09 ENCOUNTER — Encounter: Payer: Self-pay | Admitting: Cardiovascular Disease

## 2012-11-09 ENCOUNTER — Ambulatory Visit (INDEPENDENT_AMBULATORY_CARE_PROVIDER_SITE_OTHER): Payer: Medicare Other | Admitting: Cardiovascular Disease

## 2012-11-09 VITALS — BP 158/80 | HR 70 | Ht 74.0 in | Wt 258.0 lb

## 2012-11-09 DIAGNOSIS — I739 Peripheral vascular disease, unspecified: Secondary | ICD-10-CM

## 2012-11-09 DIAGNOSIS — Z01818 Encounter for other preprocedural examination: Secondary | ICD-10-CM

## 2012-11-09 NOTE — Progress Notes (Signed)
   11/09/2012 [name] [name]   08/19/1931  [id number]  Primary Physician RICHTER,KAREN L., MD Primary Cardiologist: Samayah Novinger J. Jasia Hiltunen MD FACP,FACC,FAHA, FSCAI   HPI:  [name] [name] is a 79-year-old moderately overweight married African American male father of 4, grandfather to over 20 grandchildren referred by Dr. Richard Tuckman from [facility] for evaluation of claudication and leg pain. He has a history of hypertension, hyperlipidemia, diabetes and CAD. His last cardiac catheterization performed 10/05/08 by Dr. Phil not nausea or revealed an occluded circumflex marginal branch at the site of prior stenting with an occluded AV groove circumflex. His ejection fraction was 50-55% with lateral wall hypokinesia. He does complain of exertional chest pain. He has claudication there is bilateral and lifestyle limiting to less than 100 feet. Doppler studies performed her office suggest high-grade bilateral mid SFA disease.   Current Outpatient Prescriptions  Medication Sig Dispense Refill  . amLODipine (NORVASC) 10 MG tablet Take 1 tablet (10 mg total) by mouth daily.  30 tablet  3  . aspirin 81 MG tablet Take 81 mg by mouth daily.        . gabapentin (NEURONTIN) 100 MG capsule Take 100 mg by mouth 3 (three) times daily.      . glucose blood (ACCU-CHEK AVIVA) test strip 1 each by Other route. To test CBG two times a day. Use as instructed       . insulin glargine (LANTUS) 100 UNIT/ML injection Inject 25 Units into the skin daily.  10 mL  3  . insulin lispro (HUMALOG) 100 UNIT/ML injection Inject 10 Units into the skin 3 (three) times daily before meals. 10 units in the morning and evening, 5 units before lunch  1 vial  3  . Insulin Syringe-Needle U-100 (INSULIN SYRINGE 1CC/31GX5/16") 31G X 5/16" 1 ML MISC by Does not apply route. To inject insulin subcutaneusly every day       . Lancets (ACCU-CHEK MULTICLIX) lancets 1 each by Other route as needed. Use as instructed       . lisinopril  (PRINIVIL,ZESTRIL) 20 MG tablet Take 20 mg by mouth daily.      . metFORMIN (GLUCOPHAGE) 500 MG tablet 2 tablets every morning, one tablet in afternoon, and two tablets in the night  150 tablet  3  . metoprolol tartrate (LOPRESSOR) 25 MG tablet Take 1 tablet (25 mg total) by mouth 2 (two) times daily.  40 tablet  3  . omeprazole (PRILOSEC) 20 MG capsule Take 20 mg by mouth daily.        . OVER THE COUNTER MEDICATION Take 1 tablet by mouth daily. Allergy relief      . pravastatin (PRAVACHOL) 40 MG tablet Take 40 mg by mouth daily.      . tamsulosin (FLOMAX) 0.4 MG CAPS capsule Take by mouth at bedtime.       No current facility-administered medications for this visit.    No Known Allergies  History   Social History  . Marital Status: Married    Spouse Name: N/A    Number of Children: N/A  . Years of Education: N/A   Occupational History  . Not on file.   Social History Main Topics  . Smoking status: Former Smoker    Quit date: 07/08/1980  . Smokeless tobacco: Not on file  . Alcohol Use: Not on file  . Drug Use: Not on file  . Sexual Activity: Not on file   Other Topics Concern  . Not on file     Social History Narrative   Occupation: Minister   Married   Exercises regularly     Review of Systems: General: negative for chills, fever, night sweats or weight changes.  Cardiovascular: negative for chest pain, dyspnea on exertion, edema, orthopnea, palpitations, paroxysmal nocturnal dyspnea or shortness of breath Dermatological: negative for rash Respiratory: negative for cough or wheezing Urologic: negative for hematuria Abdominal: negative for nausea, vomiting, diarrhea, bright red blood per rectum, melena, or hematemesis Neurologic: negative for visual changes, syncope, or dizziness All other systems reviewed and are otherwise negative except as noted above.    Blood pressure 158/80, pulse 70, height 6' 2" (1.88 m), weight 258 lb (117.028 kg).  Physical Examination:  General appearance - alert, well appearing, and in no distress Mental status - alert, oriented to person, place, and time Chest - clear to auscultation, no wheezes, rales or rhonchi, symmetric air entry Heart - normal rate, regular rhythm, normal S1, S2, no murmurs, rubs, clicks or gallops Abdomen - soft, nontender, nondistended, no masses or organomegaly Extremities - peripheral pulses normal, no pedal edema, no clubbing or cyanosis, absent pedal pulses  EKG not performed today  ASSESSMENT AND PLAN:   CLAUDICATION, INTERMITTENT Patient has a history of intermittent claudication of both calves at less than 100 feet. He had lower extremity arterial Doppler studies performed in our office 10/12/12 revealing a right ABI of 0.750 left ABI 0.65. He had high-frequency signals in both mid SFAs. He had three-vessel runoff bilaterally. Based on his duplex information I suspect he has percutaneously revascularizable obstructive disease. The patient wishes to have these addressed we will plan on performing abdominal aortography with bifemoral runoff and endovascular therapy.      [name] [name] J. Jiraiya Mcewan MD FACP,FACC,FAHA, FSCAI 11/09/2012 8:35 AM  

## 2012-11-09 NOTE — Patient Instructions (Signed)
Your physician has requested that you have a lexiscan myoview. For further information please visit [email or web address] Please follow instruction sheet, as given.  Your physician has requested that you have a peripheral vascular angiogram. This exam is performed at the hospital. During this exam IV contrast is used to look at arterial blood flow. Please review the information sheet given for details.  Marland Kitcheninstf

## 2012-11-09 NOTE — Assessment & Plan Note (Signed)
Patient has a history of intermittent claudication of both calves at less than 100 feet. He had lower extremity arterial Doppler studies performed in our office 10/12/12 revealing a right ABI of 0.750 left ABI 0.65. He had high-frequency signals in both mid SFAs. He had three-vessel runoff bilaterally. Based on his duplex information I suspect he has percutaneously revascularizable obstructive disease. The patient wishes to have these addressed we will plan on performing abdominal aortography with bifemoral runoff and endovascular therapy.

## 2012-11-10 ENCOUNTER — Encounter: Payer: Self-pay | Admitting: Cardiovascular Disease

## 2012-11-11 ENCOUNTER — Ambulatory Visit (HOSPITAL_COMMUNITY)
Admission: RE | Admit: 2012-11-11 | Discharge: 2012-11-11 | Disposition: A | Discharge status: Home / Self Care | Payer: Medicare Other | Source: Ambulatory Visit | Attending: Cardiovascular Disease | Admitting: Cardiovascular Disease

## 2012-11-11 DIAGNOSIS — I10 Essential (primary) hypertension: Secondary | ICD-10-CM | POA: Insufficient documentation

## 2012-11-11 DIAGNOSIS — I2510 Atherosclerotic heart disease of native coronary artery without angina pectoris: Secondary | ICD-10-CM

## 2012-11-11 DIAGNOSIS — E119 Type 2 diabetes mellitus without complications: Secondary | ICD-10-CM | POA: Insufficient documentation

## 2012-11-11 DIAGNOSIS — I252 Old myocardial infarction: Secondary | ICD-10-CM | POA: Insufficient documentation

## 2012-11-11 DIAGNOSIS — Z01818 Encounter for other preprocedural examination: Secondary | ICD-10-CM

## 2012-11-11 DIAGNOSIS — R0609 Other forms of dyspnea: Secondary | ICD-10-CM | POA: Insufficient documentation

## 2012-11-11 DIAGNOSIS — R5381 Other malaise: Secondary | ICD-10-CM | POA: Insufficient documentation

## 2012-11-11 DIAGNOSIS — Z794 Long term (current) use of insulin: Secondary | ICD-10-CM | POA: Insufficient documentation

## 2012-11-11 DIAGNOSIS — Z87891 Personal history of nicotine dependence: Secondary | ICD-10-CM | POA: Insufficient documentation

## 2012-11-11 DIAGNOSIS — E669 Obesity, unspecified: Secondary | ICD-10-CM | POA: Insufficient documentation

## 2012-11-11 DIAGNOSIS — R079 Chest pain, unspecified: Secondary | ICD-10-CM | POA: Insufficient documentation

## 2012-11-11 DIAGNOSIS — R0989 Other specified symptoms and signs involving the circulatory and respiratory systems: Secondary | ICD-10-CM | POA: Insufficient documentation

## 2012-11-11 DIAGNOSIS — I739 Peripheral vascular disease, unspecified: Secondary | ICD-10-CM | POA: Insufficient documentation

## 2012-11-11 MED ORDER — REGADENOSON 0.4 MG/5ML IV SOLN
0.4000 mg | Freq: Once | INTRAVENOUS | Status: AC
  Administered 2012-11-11: 0.4 mg via INTRAVENOUS

## 2012-11-11 MED ORDER — TECHNETIUM TC 99M SESTAMIBI GENERIC - CARDIOLITE
30.9000 | Freq: Once | INTRAVENOUS | Status: AC | PRN
  Administered 2012-11-11: 30.9 via INTRAVENOUS

## 2012-11-11 MED ORDER — TECHNETIUM TC 99M SESTAMIBI GENERIC - CARDIOLITE
10.9000 | Freq: Once | INTRAVENOUS | Status: AC | PRN
  Administered 2012-11-11: 10.9 via INTRAVENOUS

## 2012-11-11 NOTE — Procedures (Addendum)
[facility] [address] [place] [address] [phone]  Cardiology Nuclear Med Study  [address] [name] [name] is a 79 y.o. male     MRN : [id number]     DOB: 1931/10/18  Procedure Date: 11/11/2012  Nuclear Med Background Indication for Stress Test:  Surgical Clearance History:  CAD;MI (AWMI);STENT/PTCA Cardiac Risk Factors: History of Smoking, Hypertension, IDDM Type 2, Lipids, Obesity and PVD  Symptoms:  Chest Pain, DOE and Fatigue   Nuclear Pre-Procedure Caffeine/Decaff Intake:  7:00pm NPO After: 5:00am   IV Site: R Hand  IV 0.9% NS with Angio Cath:  22g  Chest Size (in):  44"  IV Started by: [name], RN  Height: 6\' 2"  (1.88 m)  Cup Size: n/a  BMI:  Body mass index is 33.11 kg/(m^2). Weight:  258 lb (117.028 kg)   Tech Comments:  N/A    Nuclear Med Study 1 or 2 day study: 1 day  Stress Test Type:  Lexiscan  Order Authorizing Provider:  Nanetta Batty, MD   Resting Radionuclide: Technetium 27m Sestamibi  Resting Radionuclide Dose: 10.9 mCi   Stress Radionuclide:  Technetium 69m Sestamibi  Stress Radionuclide Dose: 30.9 mCi           Stress Protocol Rest HR: 62 Stress HR: 86  Rest BP: 155/92 Stress BP: 136/78  Exercise Time (min): n/a METS: n/a          Dose of Adenosine (mg):  n/a Dose of Lexiscan: 0.4 mg  Dose of Atropine (mg): n/a Dose of Dobutamine: n/a mcg/kg/min (at max HR)  Stress Test Technologist: Ernestene Mention, CCT Nuclear Technologist: Gonzella Lex, CNMT   Rest Procedure:  Myocardial perfusion imaging was performed at rest 45 minutes following the intravenous administration of Technetium 55m Sestamibi. Stress Procedure:  The patient received IV Lexiscan 0.4 mg over 15-seconds.  Technetium 59m Sestamibi injected at 30-seconds.  There were no significant changes with Lexiscan.  Quantitative spect images were obtained after a 45 minute delay.  Transient Ischemic Dilatation (Normal <1.22):   1.19 Lung/Heart Ratio (Normal <0.45):  0.25 QGS EDV:  142 ml QGS ESV:  86 ml LV Ejection Fraction: 40%  Rest ECG: NSR with non-specific ST-T wave changes  Stress ECG: No significant change from baseline ECG  QPS Raw Data Images:  Normal; no motion artifact; normal heart/lung ratio. Stress Images:  There is decreased uptake in the lateral wall. Rest Images:  There is decreased uptake in the lateral wall. Subtraction (SDS):  Partially reversible inferolateral defect. Large territory (35%) involved. SDS 6.  Impression Exercise Capacity:  Lexiscan with no exercise. BP Response:  Normal blood pressure response. Clinical Symptoms:  There is dyspnea. ECG Impression:  No significant ECG changes with Lexiscan. Comparison with Prior Nuclear Study: No previous nuclear study performed  Overall Impression:  Intermediate risk stress nuclear study with a large, mostly fixed defect. There may be some periinfarct ischemia. This is in a territory of previously described MI and angioplasty.  LV Wall Motion:  LVEF 40% with inferolateral hypokinesis.  Chrystie Nose, MD, [facility] Board Certified in Nuclear Cardiology Attending Cardiologist [facility]  Chrystie Nose, MD  11/11/2012 12:45 PM

## 2012-11-16 ENCOUNTER — Other Ambulatory Visit: Payer: Self-pay | Admitting: *Deleted

## 2012-11-16 DIAGNOSIS — Z01818 Encounter for other preprocedural examination: Secondary | ICD-10-CM

## 2012-11-17 ENCOUNTER — Encounter: Payer: Self-pay | Admitting: *Deleted

## 2012-11-19 ENCOUNTER — Encounter (HOSPITAL_COMMUNITY): Payer: Self-pay | Admitting: Emergency Medicine

## 2012-11-19 ENCOUNTER — Emergency Department (INDEPENDENT_AMBULATORY_CARE_PROVIDER_SITE_OTHER)
Admission: EM | Admit: 2012-11-19 | Discharge: 2012-11-19 | Disposition: A | Discharge status: Home / Self Care | Payer: Medicare Other | Source: Home / Self Care | Attending: Family Medicine | Admitting: Family Medicine

## 2012-11-19 DIAGNOSIS — B029 Zoster without complications: Secondary | ICD-10-CM

## 2012-11-19 DIAGNOSIS — N182 Chronic kidney disease, stage 2 (mild): Secondary | ICD-10-CM

## 2012-11-19 LAB — POCT I-STAT, CHEM 8
Calcium, Ion: 1.26 mmol/L (ref 1.13–1.30)
Creatinine, Ser: 1.2 mg/dL (ref 0.50–1.35)
Hemoglobin: 15.6 g/dL (ref 13.0–17.0)
Potassium: 4.3 mEq/L (ref 3.5–5.1)
Sodium: 141 mEq/L (ref 135–145)
TCO2: 25 mmol/L (ref 0–100)

## 2012-11-19 MED ORDER — VALACYCLOVIR HCL 1 G PO TABS: 1000.0000 mg | ORAL_TABLET | Freq: Three times a day (TID) | ORAL | Status: DC

## 2012-11-19 MED ORDER — HYDROCODONE-ACETAMINOPHEN 5-325 MG PO TABS: 1.0000 | ORAL_TABLET | Freq: Four times a day (QID) | ORAL | Status: DC | PRN
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
## 2012-11-19 NOTE — ED Notes (Signed)
Pain right shoulder w diffuse red rash

## 2012-11-30 ENCOUNTER — Ambulatory Visit
Admission: RE | Admit: 2012-11-30 | Discharge: 2012-11-30 | Disposition: A | Discharge status: Home / Self Care | Payer: Medicare Other | Source: Ambulatory Visit | Attending: Cardiovascular Disease | Admitting: Cardiovascular Disease

## 2012-11-30 DIAGNOSIS — Z01818 Encounter for other preprocedural examination: Secondary | ICD-10-CM

## 2012-11-30 LAB — CBC
HCT: 40.8 % (ref 39.0–52.0)
MCH: 31 pg (ref 26.0–34.0)
MCHC: 35 g/dL (ref 30.0–36.0)
MCV: 88.3 fL (ref 78.0–100.0)
Platelets: 245 10*3/uL (ref 150–400)
RDW: 13.6 % (ref 11.5–15.5)

## 2012-11-30 LAB — APTT: aPTT: 35 seconds (ref 24–37)

## 2012-11-30 LAB — BASIC METABOLIC PANEL
BUN: 17 mg/dL (ref 6–23)
CO2: 24 mEq/L (ref 19–32)
Calcium: 9.7 mg/dL (ref 8.4–10.5)
Chloride: 105 mEq/L (ref 96–112)
Creat: 1.3 mg/dL (ref 0.50–1.35)
Glucose, Bld: 105 mg/dL — ABNORMAL HIGH (ref 70–99)

## 2012-12-02 ENCOUNTER — Encounter (HOSPITAL_COMMUNITY): Payer: Self-pay

## 2012-12-03 ENCOUNTER — Telehealth: Payer: Self-pay | Admitting: Cardiovascular Disease

## 2012-12-03 NOTE — Telephone Encounter (Signed)
i called back and spoke with his wife and reviewed the instructions for next Tuesday.

## 2012-12-03 NOTE — Telephone Encounter (Signed)
Message forwarded to K. Vogel, RN.  

## 2012-12-03 NOTE — Telephone Encounter (Signed)
Please call and talk with him about his procedure on Tuesday 12/07/12---patient states that Dr. Allyson Sabal has not spoken with him about this.

## 2012-12-03 NOTE — Telephone Encounter (Signed)
Lm with family member to call me back with any questions.

## 2012-12-03 NOTE — Telephone Encounter (Signed)
Returning kathryn's call.  Getting ready to leave house again.  Said back maybe 2.30  .  Procedure on Tuesday and he needs to know something.  Please call

## 2012-12-07 ENCOUNTER — Ambulatory Visit (HOSPITAL_COMMUNITY)
Admission: RE | Admit: 2012-12-07 | Discharge: 2012-12-08 | Disposition: A | Discharge status: Home / Self Care | Payer: Medicare Other | Source: Ambulatory Visit | Attending: Cardiovascular Disease | Admitting: Cardiovascular Disease

## 2012-12-07 ENCOUNTER — Encounter (HOSPITAL_COMMUNITY): Payer: Self-pay | Admitting: General Practice

## 2012-12-07 ENCOUNTER — Encounter: Payer: Self-pay | Admitting: *Deleted

## 2012-12-07 ENCOUNTER — Encounter (HOSPITAL_COMMUNITY)
Admission: RE | Disposition: A | Discharge status: Home / Self Care | Payer: Self-pay | Source: Ambulatory Visit | Attending: Cardiovascular Disease

## 2012-12-07 DIAGNOSIS — E663 Overweight: Secondary | ICD-10-CM | POA: Insufficient documentation

## 2012-12-07 DIAGNOSIS — E119 Type 2 diabetes mellitus without complications: Secondary | ICD-10-CM | POA: Insufficient documentation

## 2012-12-07 DIAGNOSIS — I739 Peripheral vascular disease, unspecified: Secondary | ICD-10-CM | POA: Diagnosis present

## 2012-12-07 DIAGNOSIS — Z01818 Encounter for other preprocedural examination: Secondary | ICD-10-CM

## 2012-12-07 DIAGNOSIS — Z23 Encounter for immunization: Secondary | ICD-10-CM | POA: Insufficient documentation

## 2012-12-07 DIAGNOSIS — I7389 Other specified peripheral vascular diseases: Secondary | ICD-10-CM

## 2012-12-07 DIAGNOSIS — Z5309 Procedure and treatment not carried out because of other contraindication: Secondary | ICD-10-CM | POA: Insufficient documentation

## 2012-12-07 DIAGNOSIS — I70219 Atherosclerosis of native arteries of extremities with intermittent claudication, unspecified extremity: Secondary | ICD-10-CM

## 2012-12-07 DIAGNOSIS — I7092 Chronic total occlusion of artery of the extremities: Secondary | ICD-10-CM | POA: Insufficient documentation

## 2012-12-07 DIAGNOSIS — E785 Hyperlipidemia, unspecified: Secondary | ICD-10-CM | POA: Diagnosis present

## 2012-12-07 DIAGNOSIS — E1142 Type 2 diabetes mellitus with diabetic polyneuropathy: Secondary | ICD-10-CM | POA: Diagnosis present

## 2012-12-07 DIAGNOSIS — I10 Essential (primary) hypertension: Secondary | ICD-10-CM | POA: Diagnosis present

## 2012-12-07 DIAGNOSIS — Z79899 Other long term (current) drug therapy: Secondary | ICD-10-CM | POA: Insufficient documentation

## 2012-12-07 DIAGNOSIS — E7800 Pure hypercholesterolemia, unspecified: Secondary | ICD-10-CM | POA: Insufficient documentation

## 2012-12-07 HISTORY — PX: LOWER EXTREMITY ANGIOGRAM: SHX5508

## 2012-12-07 HISTORY — PX: LOWER EXTREMITY ANGIOGRAM: SHX5955

## 2012-12-07 LAB — POCT ACTIVATED CLOTTING TIME: Activated Clotting Time: 217 s

## 2012-12-07 LAB — GLUCOSE, CAPILLARY
Glucose-Capillary: 91 mg/dL (ref 70–99)
Glucose-Capillary: 103 mg/dL — ABNORMAL HIGH (ref 70–99)
Glucose-Capillary: 258 mg/dL — ABNORMAL HIGH (ref 70–99)

## 2012-12-07 SURGERY — ANGIOGRAM, LOWER EXTREMITY
Anesthesia: LOCAL

## 2012-12-07 MED ORDER — ACETAMINOPHEN 325 MG PO TABS: 650.0000 mg | ORAL_TABLET | ORAL | Status: DC | PRN

## 2012-12-07 MED ORDER — HYDRALAZINE HCL 20 MG/ML IJ SOLN
10.0000 mg | INTRAMUSCULAR | Status: DC | PRN
  Administered 2012-12-07: 10 mg via INTRAVENOUS
  Filled 2012-12-07: qty 1

## 2012-12-07 MED ORDER — HEPARIN SODIUM (PORCINE) 1000 UNIT/ML IJ SOLN
INTRAMUSCULAR | Status: AC
  Filled 2012-12-07: qty 1

## 2012-12-07 MED ORDER — INFLUENZA VAC SPLIT QUAD 0.5 ML IM SUSP
0.5000 mL | INTRAMUSCULAR | Status: AC
  Administered 2012-12-08: 09:00:00 0.5 mL via INTRAMUSCULAR
  Filled 2012-12-07: qty 0.5

## 2012-12-07 MED ORDER — ASPIRIN 81 MG PO CHEW: 81.0000 mg | CHEWABLE_TABLET | ORAL | Status: DC

## 2012-12-07 MED ORDER — FENTANYL CITRATE 0.05 MG/ML IJ SOLN
INTRAMUSCULAR | Status: AC
  Filled 2012-12-07: qty 2

## 2012-12-07 MED ORDER — AMLODIPINE BESYLATE 10 MG PO TABS
10.0000 mg | ORAL_TABLET | Freq: Every day | ORAL | Status: DC
  Administered 2012-12-07 – 2012-12-08 (×2): 10 mg via ORAL
  Filled 2012-12-07 (×2): qty 1

## 2012-12-07 MED ORDER — ONDANSETRON HCL 4 MG/2ML IJ SOLN: 4.0000 mg | Freq: Four times a day (QID) | INTRAMUSCULAR | Status: DC | PRN

## 2012-12-07 MED ORDER — ASPIRIN 81 MG PO CHEW
81.0000 mg | CHEWABLE_TABLET | ORAL | Status: AC
  Administered 2012-12-07: 81 mg via ORAL
  Filled 2012-12-07: qty 1

## 2012-12-07 MED ORDER — ALPRAZOLAM 0.25 MG PO TABS: 0.2500 mg | ORAL_TABLET | Freq: Three times a day (TID) | ORAL | Status: DC | PRN

## 2012-12-07 MED ORDER — ASPIRIN EC 81 MG PO TBEC
81.0000 mg | DELAYED_RELEASE_TABLET | Freq: Every day | ORAL | Status: DC
  Administered 2012-12-08: 09:00:00 81 mg via ORAL
  Filled 2012-12-07 (×2): qty 1

## 2012-12-07 MED ORDER — LIDOCAINE HCL (PF) 1 % IJ SOLN
INTRAMUSCULAR | Status: AC
  Filled 2012-12-07: qty 30

## 2012-12-07 MED ORDER — HEPARIN (PORCINE) IN NACL 2-0.9 UNIT/ML-% IJ SOLN
INTRAMUSCULAR | Status: AC
  Filled 2012-12-07: qty 1000

## 2012-12-07 MED ORDER — LISINOPRIL 20 MG PO TABS
20.0000 mg | ORAL_TABLET | Freq: Two times a day (BID) | ORAL | Status: DC
  Administered 2012-12-07 – 2012-12-08 (×2): 20 mg via ORAL
  Filled 2012-12-07 (×3): qty 1

## 2012-12-07 MED ORDER — HYDRALAZINE HCL 20 MG/ML IJ SOLN
20.0000 mg | INTRAMUSCULAR | Status: DC | PRN
  Administered 2012-12-07: 18:00:00 20 mg via INTRAVENOUS
  Filled 2012-12-07: qty 1

## 2012-12-07 MED ORDER — MIDAZOLAM HCL 2 MG/2ML IJ SOLN
INTRAMUSCULAR | Status: AC
  Filled 2012-12-07: qty 2

## 2012-12-07 MED ORDER — INSULIN NPH (HUMAN) (ISOPHANE) 100 UNIT/ML ~~LOC~~ SUSP
10.0000 [IU] | Freq: Two times a day (BID) | SUBCUTANEOUS | Status: DC
  Administered 2012-12-07 – 2012-12-08 (×2): 10 [IU] via SUBCUTANEOUS
  Filled 2012-12-07: qty 10

## 2012-12-07 MED ORDER — SODIUM CHLORIDE 0.9 % IJ SOLN: 3.0000 mL | INTRAMUSCULAR | Status: DC | PRN

## 2012-12-07 MED ORDER — DIAZEPAM 5 MG PO TABS
5.0000 mg | ORAL_TABLET | ORAL | Status: AC
  Administered 2012-12-07: 5 mg via ORAL
  Filled 2012-12-07: qty 1

## 2012-12-07 MED ORDER — SODIUM CHLORIDE 0.9 % IV SOLN
INTRAVENOUS | Status: DC
  Administered 2012-12-07: 11:00:00 via INTRAVENOUS

## 2012-12-07 MED ORDER — POTASSIUM CHLORIDE IN NACL 20-0.9 MEQ/L-% IV SOLN
INTRAVENOUS | Status: AC
  Administered 2012-12-07: 18:00:00 via INTRAVENOUS
  Filled 2012-12-07: qty 1000

## 2012-12-07 MED ORDER — DIAZEPAM 5 MG/ML IJ SOLN: 2.5000 mg | Freq: Once | INTRAMUSCULAR | Status: DC

## 2012-12-07 MED ORDER — MORPHINE SULFATE 2 MG/ML IJ SOLN
2.0000 mg | INTRAMUSCULAR | Status: DC | PRN
  Administered 2012-12-07: 18:00:00 2 mg via INTRAVENOUS
  Filled 2012-12-07: qty 1

## 2012-12-07 NOTE — CV Procedure (Signed)
[name] [name] is a 79 y.o. male    [id number] LOCATION:  FACILITY: [facility]  PHYSICIAN: Nanetta Batty, M.D. 1931-05-13   DATE OF PROCEDURE:  12/07/2012  DATE OF DISCHARGE:     PV Angiogram/Intervention    History obtained from chart review.Mr. Edelen is a 79 year old moderately overweight married African American male father of 4, grandfather to over 20 grandchildren referred by Dr. Durel Salts from [facility] for evaluation of claudication and leg pain. He has a history of hypertension, hyperlipidemia, diabetes and CAD. His last cardiac catheterization performed 10/05/08 by Dr. Michele Mcalpine not nausea or revealed an occluded circumflex marginal branch at the site of prior stenting with an occluded AV groove circumflex. His ejection fraction was 50-55% with lateral wall hypokinesia. He does complain of exertional chest pain. He has claudication there is bilateral and lifestyle limiting to less than 100 feet. Doppler studies performed her office suggest high-grade bilateral mid SFA disease.    PROCEDURE DESCRIPTION:   The patient was brought to the second floor [facility] cath lab in the postabsorptive state. He was premedicated with Valium 5 mg by mouth, IV Versed and fentanyl. His right groinwas prepped and shaved in usual sterile fashion. Xylocaine 1% was used for local anesthesia. A 7 French sheath was inserted into the right common femoral artery using standard Seldinger technique.a 5 French pigtail catheter was used for abdominal aortography, bilateral iliac angiography, and bifemoral runoff using bolus chase digital subtraction step table technique. Visipaque dye was used for the entirety of the case. Retrograde aortic pressure was monitored during the case.   HEMODYNAMICS:    AO SYSTOLIC/AO DIASTOLIC: 172/84   Angiographic Data:   1: Abdominal aorta-the distal dominant artery was free of significant disease  2: Left lower extremity-there is a moderately long  segment chronic total occlusion mid left SSA reconstituting in the above-the-knee popliteal bypass on the femoris collaterals with 0 vessel runoff  3: Right lower extremity-long 90% calcified mid right SFA stenosis with 0 vessel runoff below the knee  IMPRESSION:high-grade bilateral SFA disease left greater than right was 0 vessel runoff bilaterally. He does have lifestyle limiting claudication. We'll attempt to cross the left SFA chronic total occlusion with the [place] crossing catheter and percutaneously revascularized the segment to provide inflow and collateral filling.   Procedure Description:contralateral access was obtained with a 5 [place] crossover catheter, a 0.35 Versicore wire, and a 7 [place] destination sheath.the patient received a total of 8000 units of heparin intravenously and an ACT of 217. A total of 200 cc of contrast was administered to the patient. Using an 014/300 cm length Spartic[place] crossing catheter attempts were made to cross the long calcified chronic total occlusion in the mid left SFA unsuccessfully. The intent was to perform directional atherectomy of that segment if I crossed successfully.I then pulled the sheath back across the bifurcation and secure it.  Final Impression: unsuccessful attempt at percutaneous revascularization of a calcified long segment chronic total occlusion mid left SFA was 0 vessel runoff for lifestyle limiting claudication. The sheath will be removed once the ACT falls below 170 and pressure will be held on the groin to achieve hemostasis. The patient left the lab condition. He will be hydrated overnight, discharged home in the morning with followup arranged in one to 2 weeks in my office. Medical therapy will be recommended.    [name] MD, [facility] 12/07/2012 2:45 PM

## 2012-12-07 NOTE — Interval H&P Note (Signed)
History and Physical Interval Note:  12/07/2012 1:18 PM  [name] [name]  has presented today for surgery, with the diagnosis of pvd  The various methods of treatment have been discussed with the patient and family. After consideration of risks, benefits and other options for treatment, the patient has consented to  Procedure(s): LOWER EXTREMITY ANGIOGRAM (N/A) as a surgical intervention .  The patient's history has been reviewed, patient examined, no change in status, stable for surgery.  I have reviewed the patient's chart and labs.  Questions were answered to the patient's satisfaction.     [name]

## 2012-12-07 NOTE — H&P (View-Only) (Signed)
11/09/2012 [name] [name]   1932/01/27  [id number]  Primary Physician [name] [name]., MD Primary Cardiologist: [name] Gess MD [name] [name]   HPI:  [name] is a 79 year old moderately overweight married African American male father of 4, grandfather to over 20 grandchildren referred by Dr. Durel [name] from [facility] for evaluation of claudication and leg pain. He has a history of hypertension, hyperlipidemia, diabetes and CAD. His last cardiac catheterization performed 10/05/08 by Dr. Michele [name] not nausea or revealed an occluded circumflex marginal branch at the site of prior stenting with an occluded AV groove circumflex. His ejection fraction was 50-55% with lateral wall hypokinesia. He does complain of exertional chest pain. He has claudication there is bilateral and lifestyle limiting to less than 100 feet. Doppler studies performed her office suggest high-grade bilateral mid SFA disease.   Current Outpatient Prescriptions  Medication Sig Dispense Refill  . amLODipine (NORVASC) 10 MG tablet Take 1 tablet (10 mg total) by mouth daily.  30 tablet  3  . aspirin 81 MG tablet Take 81 mg by mouth daily.        [name] gabapentin (NEURONTIN) 100 MG capsule Take 100 mg by mouth 3 (three) times daily.      [name] glucose blood (ACCU-CHEK AVIVA) test strip 1 each by Other route. To test CBG two times a day. Use as instructed       . insulin glargine (LANTUS) 100 UNIT/ML injection Inject 25 Units into the skin daily.  10 mL  3  . insulin lispro (HUMALOG) 100 UNIT/ML injection Inject 10 Units into the skin 3 (three) times daily before meals. 10 units in the morning and evening, 5 units before lunch  1 vial  3  . Insulin Syringe-Needle U-100 (INSULIN SYRINGE 1CC/31GX5/16") 31G X 5/16" 1 ML MISC by Does not apply route. To inject insulin subcutaneusly every day       . Lancets (ACCU-CHEK MULTICLIX) lancets 1 each by Other route as needed. Use as instructed       . lisinopril  (PRINIVIL,ZESTRIL) 20 MG tablet Take 20 mg by mouth daily.      . metFORMIN (GLUCOPHAGE) 500 MG tablet 2 tablets every morning, one tablet in afternoon, and two tablets in the night  150 tablet  3  . metoprolol tartrate (LOPRESSOR) 25 MG tablet Take 1 tablet (25 mg total) by mouth 2 (two) times daily.  40 tablet  3  . omeprazole (PRILOSEC) 20 MG capsule Take 20 mg by mouth daily.        [name] OVER THE COUNTER MEDICATION Take 1 tablet by mouth daily. Allergy relief      . pravastatin (PRAVACHOL) 40 MG tablet Take 40 mg by mouth daily.      . tamsulosin (FLOMAX) 0.4 MG CAPS capsule Take by mouth at bedtime.       No current facility-administered medications for this visit.    No Known Allergies  History   Social History  . Marital Status: Married    Spouse Name: N/A    Number of Children: N/A  . Years of Education: N/A   Occupational History  . Not on file.   Social History Main Topics  . Smoking status: Former Smoker    Quit date: 07/08/1980  . Smokeless tobacco: Not on file  . Alcohol Use: Not on file  . Drug Use: Not on file  . Sexual Activity: Not on file   Other Topics Concern  . Not on file  Social History Narrative   Occupation: Optician, dispensing   Married   Exercises regularly     Review of Systems: General: negative for chills, fever, night sweats or weight changes.  Cardiovascular: negative for chest pain, dyspnea on exertion, edema, orthopnea, palpitations, paroxysmal nocturnal dyspnea or shortness of breath Dermatological: negative for rash Respiratory: negative for cough or wheezing Urologic: negative for hematuria Abdominal: negative for nausea, vomiting, diarrhea, bright red blood per rectum, melena, or hematemesis Neurologic: negative for visual changes, syncope, or dizziness All other systems reviewed and are otherwise negative except as noted above.    Blood pressure 158/80, pulse 70, height 6\' 2"  (1.88 m), weight 258 lb (117.028 kg).  Physical Examination:  General appearance - alert, well appearing, and in no distress Mental status - alert, oriented to person, place, and time Chest - clear to auscultation, no wheezes, rales or rhonchi, symmetric air entry Heart - normal rate, regular rhythm, normal S1, S2, no murmurs, rubs, clicks or gallops Abdomen - soft, nontender, nondistended, no masses or organomegaly Extremities - peripheral pulses normal, no pedal edema, no clubbing or cyanosis, absent pedal pulses  EKG not performed today  ASSESSMENT AND PLAN:   CLAUDICATION, INTERMITTENT Patient has a history of intermittent claudication of both calves at less than 100 feet. He had lower extremity arterial Doppler studies performed in our office 10/12/12 revealing a right ABI of 0.750 left ABI 0.65. He had high-frequency signals in both mid SFAs. He had three-vessel runoff bilaterally. Based on his duplex information I suspect he has percutaneously revascularizable obstructive disease. The patient wishes to have these addressed we will plan on performing abdominal aortography with bifemoral runoff and endovascular therapy.      [name] Gess MD FACP,FACC,FAHA, [facility] 11/09/2012 8:35 AM

## 2012-12-08 DIAGNOSIS — I739 Peripheral vascular disease, unspecified: Secondary | ICD-10-CM

## 2012-12-08 LAB — BASIC METABOLIC PANEL
BUN: 16 mg/dL (ref 6–23)
CO2: 22 mEq/L (ref 19–32)
Chloride: 106 mEq/L (ref 96–112)
Creatinine, Ser: 1.2 mg/dL (ref 0.50–1.35)
GFR calc Af Amer: 65 mL/min — ABNORMAL LOW (ref >90)
Glucose, Bld: 227 mg/dL — ABNORMAL HIGH (ref 70–99)
Potassium: 4.4 mEq/L (ref 3.5–5.1)
Sodium: 138 mEq/L (ref 135–145)

## 2012-12-08 LAB — CBC
HCT: 39 % (ref 39.0–52.0)
Hemoglobin: 13.4 g/dL (ref 13.0–17.0)
MCH: 31.2 pg (ref 26.0–34.0)
MCHC: 34.4 g/dL (ref 30.0–36.0)
MCV: 90.9 fL (ref 78.0–100.0)
RBC: 4.29 MIL/uL (ref 4.22–5.81)

## 2012-12-08 LAB — GLUCOSE, CAPILLARY: Glucose-Capillary: 162 mg/dL — ABNORMAL HIGH (ref 70–99)

## 2012-12-08 NOTE — Discharge Summary (Signed)
Physician Discharge Summary  Patient ID: [name] [name] [name] MRN: [id number] DOB/AGE: 03-31-1931 79 y.o.  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 Durel [name] from [facility], to Dr. Allyson [name] for evaluation of claudication and leg pain. He has a history of hypertension, hyperlipidemia, diabetes and CAD. The patient has complained of  bilateral lifestyle limiting claudication and has been unable to walk less than 100 feet without discomfort. Doppler studies performed in our office suggested high-grade bilateral mid SFA disease.  He presented to [facility] [name] [name] 12/07/12 to undergo planned PV angiography with an attempt at revascularization. The procedure was performed by Dr. Allyson [name] via the right femoral artery.  He was found to have high-grade bilateral SFA disease, left greater than right.  There was 0 vessel runoff bilaterally. Attempt at revascularization was unscucessful and the procedure was termintated. It was decided to treat medically. The patient left the cath lab in stable condition. He was kept overnight for observation and hydration. He had no post-cath complications. The right femoral access site remained stable, free from hematoma and bruit. He had no pain with ambulation. The patient was last seen and examined by Dr. Rennis [name], who determined he was stable for discharge home. He was instructed to continue with daily ASA. He will f/u with Dr. Allyson [name] [name] [name] 12/23/12 to discuss further options.   Consults: None  Significant Diagnostic Studies:   PV  Angio HEMODYNAMICS:  AO SYSTOLIC/AO DIASTOLIC: 172/84  Angiographic Data:  1: Abdominal aorta-the distal dominant artery was free of significant disease  2: Left lower extremity-there is a moderately long segment chronic total occlusion mid left SSA reconstituting in the above-the-knee popliteal bypass [name] [name] the femoris collaterals with 0 vessel runoff  3: Right lower extremity-long 90% calcified mid right SFA stenosis with 0 vessel runoff below the knee   Treatments: See Hospital Course  Discharge Exam: Blood pressure 153/77, pulse 82, temperature 97.8 F (36.6 C), temperature source Oral, resp. rate 20, height 6\' 2"  (1.88 m), weight 257 lb 0.9 oz (116.6 kg), SpO2 97.00%.   Disposition: 01-Home or Self Care      Discharge Orders   Future Appointments Provider Department Dept Phone   12/23/2012 9:00 AM [name] Gess, MD [facility] [phone]   Future Orders Complete By Expires   Diet - low sodium heart healthy  As directed    Discharge instructions  As directed    Comments:     Wait until Friday 12/10/12 to resume Metformin   Driving Restrictions  As directed    Comments:     No driving for 3 days   Increase activity slowly  As directed    Lifting restrictions  As directed    Comments:     No lifting more than 1/2 of milk for 3 days       Medication List         ACCU-CHEK AVIVA test strip  Generic drug:  glucose blood  1 each by Other route. To test CBG two times a day. Use as instructed     accu-chek multiclix lancets  1 each by Other route as  needed. Use as instructed     amLODipine 10 MG tablet  Commonly known as:  NORVASC  Take 10 mg by mouth daily.     aspirin EC 81 MG tablet  Take 81 mg by mouth daily.     insulin NPH 100 UNIT/ML injection  Commonly known as:  HUMULIN N,NOVOLIN N  Inject 10 Units into the skin 2 (two) times daily.     insulin regular 100 units/mL injection  Commonly known as:  NOVOLIN R,HUMULIN R  Inject 30 Units into  the skin daily.     INSULIN SYRINGE 1CC/31GX5/16" 31G X 5/16" 1 ML Misc  by Does not apply route. To inject insulin subcutaneusly every day     lisinopril 20 MG tablet  Commonly known as:  PRINIVIL,ZESTRIL  Take 20 mg by mouth 2 (two) times daily.     metFORMIN 500 MG tablet  Commonly known as:  GLUCOPHAGE  Take 500 mg by mouth 2 (two) times daily with a meal.     multivitamin with minerals Tabs tablet  Take 1 tablet by mouth daily.     pravastatin 40 MG tablet  Commonly known as:  PRAVACHOL  Take 40 mg by mouth daily.     tamsulosin 0.4 MG Caps capsule  Commonly known as:  FLOMAX  Take 0.4 mg by mouth at bedtime.       Follow-up Information   Follow up with [name] Gess, MD [name] [name] 12/23/2012. (9:00 am )    Specialty:  Cardiology   Contact information:   [address] [place] [address] [phone]      TIME SPENT [name] [name] DISCHARGE, INCLUDING PHYSICIAN TIME: >30 MINUTES Signed: Allayne Butcher, PA-C 12/08/2012, 5:30 PM

## 2012-12-08 NOTE — Progress Notes (Signed)
Subjective: Denies groin, back or flank pain.  Objective: Vital signs in last 24 hours: Temp:  [97.8 F (36.6 C)-98.3 F (36.8 C)] 97.8 F (36.6 C) (11/05 0518) Pulse Rate:  [68-88] 80 (11/05 0518) Resp:  [18] 18 (11/05 0518) BP: (100-211)/(43-124) 155/76 mmHg (11/05 0518) SpO2:  [95 %-99 %] 97 % (11/05 0518) Weight:  [257 lb 0.9 oz (116.6 kg)-258 lb (117.028 kg)] 257 lb 0.9 oz (116.6 kg) (11/05 0518) Last BM Date: 12/07/12  Intake/Output from previous day: 11/04 0701 - 11/05 0700 In: 867.5 [P.O.:120; I.V.:747.5] Out: 650 [Urine:650] Intake/Output this shift:    Medications Current Facility-Administered Medications  Medication Dose Route Frequency Provider Last Rate Last Dose  . acetaminophen (TYLENOL) tablet 650 mg  650 mg Oral Q4H PRN [name], MD      . ALPRAZolam Prudy Feeler) tablet 0.25 mg  0.25 mg Oral TID PRN Abelino Derrick, PA-C      . amLODipine (NORVASC) tablet 10 mg  10 mg Oral Daily [name], MD   10 mg at 12/07/12 1801  . aspirin EC tablet 81 mg  81 mg Oral Daily [name], MD      . diazepam (VALIUM) injection 2.5 mg  2.5 mg Intravenous Once Luke K Kilroy, PA-C      . hydrALAZINE (APRESOLINE) injection 20 mg  20 mg Intravenous Q4H PRN Abelino Derrick, PA-C   20 mg at 12/07/12 1731  . influenza vac split quadrivalent PF (FLUARIX) injection 0.5 mL  0.5 mL Intramuscular Tomorrow-1000 [name], MD      . insulin NPH (HUMULIN N,NOVOLIN N) injection 10 Units  10 Units Subcutaneous BID AC & HS [name], MD   10 Units at 12/07/12 2149  . lisinopril (PRINIVIL,ZESTRIL) tablet 20 mg  20 mg Oral BID [name], MD   20 mg at 12/07/12 1801  . morphine 2 MG/ML injection 2 mg  2 mg Intravenous Q1H PRN [name], MD   2 mg at 12/07/12 1736  . ondansetron (ZOFRAN) injection 4 mg  4 mg Intravenous Q6H PRN [name], MD        PE: General appearance: alert, cooperative and no distress Lungs: clear to auscultation  bilaterally Heart: regular rate and rhythm, S1, S2 normal, no murmur, click, rub or gallop Extremities: no LEE, right groin: no hematoma, no bruit, nontender Pulses: 0 right DP, 1+ left DP Skin: warm and dry Neurologic: Grossly normal  Lab Results:   Recent Labs  12/08/12 0411  WBC 10.2  HGB 13.4  HCT 39.0  PLT 212   BMET  Recent Labs  12/08/12 0411  NA 138  K 4.4  CL 106  CO2 22  GLUCOSE 227*  BUN 16  CREATININE 1.20  CALCIUM 9.0    Studies/Results:  PV Angio HEMODYNAMICS:  AO SYSTOLIC/AO DIASTOLIC: 172/84  Angiographic Data:  1: Abdominal aorta-the distal dominant artery was free of significant disease  2: Left lower extremity-there is a moderately long segment chronic total occlusion mid left SSA reconstituting in the above-the-knee popliteal bypass on the femoris collaterals with 0 vessel runoff  3: Right lower extremity-long 90% calcified mid right SFA stenosis with 0 vessel runoff below the knee   Assessment/Plan  Principal Problem:   PERIPHERAL VASCULAR DISEASE WITH CLAUDICATION - chronic total occlusion of mid left SFA, 90% calcified mid right SFA Active Problems:   DIABETES MELLITUS, TYPE II   HYPERCHOLESTEROLEMIA   HYPERTENSION  Plan:  S/P PV Angio, resulting in an unsuccessful attempt at percutaneous revascularization of a calcified long segment, chronic total occlusion of mid left SFA  with 0 vessel runoff for lifestyle limiting claudication. Will treat medically. On ASA. ? Cilostazol. Dr. Allyson Sabal to see in 2 weeks and will determine. Right femoral access site is stable. Will have pt ambulate unit before discharge. Vitals and labs stable. BG elevated at 227. Will give scheduled insulin. D/C home today.       LOS: 1 day    Johnda Billiot M. Sharol Harness, PA-C 12/08/2012 8:13 AM

## 2012-12-08 NOTE — Progress Notes (Signed)
Pt. Seen and examined. Agree with the NP/PA-C note as written.  Unsuccessful PCI to the left SFA.  Right groin access site with minimal ecchymosis, no bruit or significant hematoma. Intact distal pulses. Discharge today. Follow-up with Dr. Allyson Sabal.  Chrystie Nose, MD, [facility] Attending Cardiologist [facility]

## 2012-12-23 ENCOUNTER — Ambulatory Visit (INDEPENDENT_AMBULATORY_CARE_PROVIDER_SITE_OTHER): Payer: Medicare Other | Admitting: Cardiovascular Disease

## 2012-12-23 ENCOUNTER — Encounter: Payer: Self-pay | Admitting: Cardiovascular Disease

## 2012-12-23 VITALS — BP 146/81 | HR 82 | Ht 73.0 in | Wt 256.2 lb

## 2012-12-23 DIAGNOSIS — E785 Hyperlipidemia, unspecified: Secondary | ICD-10-CM

## 2012-12-23 DIAGNOSIS — Z79899 Other long term (current) drug therapy: Secondary | ICD-10-CM

## 2012-12-23 DIAGNOSIS — E7800 Pure hypercholesterolemia, unspecified: Secondary | ICD-10-CM

## 2012-12-23 MED ORDER — CILOSTAZOL 50 MG PO TABS: 50.0000 mg | ORAL_TABLET | Freq: Two times a day (BID) | ORAL | Status: DC

## 2012-12-23 NOTE — Patient Instructions (Signed)
  Your physician wants you to follow-up with him in : 6 months with Dr Allyson Sabal                                            and with an extender in : 3 months                     You will receive a reminder letter in the mail one month in advance. If you don't receive a letter, please call our office to schedule the follow-up appointment.   Your physician recommends that you return for lab work in: fasting   Your physician has recommended you make the following change in your medication: Start Pletal 50mg  twice a day

## 2012-12-23 NOTE — Progress Notes (Signed)
12/23/2012 [name] [name]   [birth year]  [id number]  Primary Physician Dois Davenport., MD Primary Cardiologist: [name] MD Roseanne Reno   HPI:  Mr. Mah is a 79 year old moderately overweight married African American male father of 4, grandfather to over 20 grandchildren referred by Dr. Durel Salts from [facility] for evaluation of claudication and leg pain. He has a history of hypertension, hyperlipidemia, diabetes and CAD. His last cardiac catheterization performed 10/05/08 by Dr. Michele Mcalpine not nausea or revealed an occluded circumflex marginal branch at the site of prior stenting with an occluded AV groove circumflex. His ejection fraction was 50-55% with lateral wall hypokinesia. He does complain of exertional chest pain. He has claudication there is bilateral and lifestyle limiting to less than 100 feet. Doppler studies performed her office suggest high-grade bilateral mid SFA disease. I performed abdominal aortography with bifemoral runoff 12/07/12 revealing two-vessel runoff below the knee bilaterally, long 90% segment segmental calcified mid right SFA stenosis and a chronic total occlusion of the mid left SFA which I attempted to cross unsuccessfully. Incision was then to recommend medical therapy    Current Outpatient Prescriptions  Medication Sig Dispense Refill  . amLODipine (NORVASC) 10 MG tablet Take 10 mg by mouth daily.      [name] aspirin EC 81 MG tablet Take 81 mg by mouth daily.      [name] glucose blood (ACCU-CHEK AVIVA) test strip 1 each by Other route. To test CBG two times a day. Use as instructed       . insulin NPH (HUMULIN N,NOVOLIN N) 100 UNIT/ML injection Inject 10 Units into the skin 2 (two) times daily.      . insulin regular (NOVOLIN R,HUMULIN R) 100 units/mL injection Inject 30 Units into the skin daily.      . Insulin Syringe-Needle U-100 (INSULIN SYRINGE 1CC/31GX5/16") 31G X 5/16" 1 ML MISC by Does not apply route. To inject insulin  subcutaneusly every day       . Lancets (ACCU-CHEK MULTICLIX) lancets 1 each by Other route as needed. Use as instructed       . lisinopril (PRINIVIL,ZESTRIL) 20 MG tablet Take 20 mg by mouth 2 (two) times daily.       . metFORMIN (GLUCOPHAGE) 500 MG tablet Take 500 mg by mouth 2 (two) times daily with a meal.      . Multiple Vitamin (MULTIVITAMIN WITH MINERALS) TABS tablet Take 1 tablet by mouth daily.      . pravastatin (PRAVACHOL) 40 MG tablet Take 40 mg by mouth daily.      . tamsulosin (FLOMAX) 0.4 MG CAPS capsule Take 0.4 mg by mouth at bedtime.       . cilostazol (PLETAL) 50 MG tablet Take 1 tablet (50 mg total) by mouth 2 (two) times daily.  60 tablet  11   No current facility-administered medications for this visit.    No Known Allergies  History   Social History  . Marital Status: Married    Spouse Name: N/A    Number of Children: N/A  . Years of Education: N/A   Occupational History  . Not on file.   Social History Main Topics  . Smoking status: Former Smoker -- 0.75 packs/day for 20 years    Types: Cigarettes    Quit date: 07/08/1980  . Smokeless tobacco: Never Used  . Alcohol Use: Yes     Comment: 12/07/2012 "quit drinking alcohol > 25 yr ago"  . Drug Use: No  .  Sexual Activity: No   Other Topics Concern  . Not on file   Social History Narrative   Occupation: Optician, dispensing   Married   Exercises regularly     Review of Systems: General: negative for chills, fever, night sweats or weight changes.  Cardiovascular: negative for chest pain, dyspnea on exertion, edema, orthopnea, palpitations, paroxysmal nocturnal dyspnea or shortness of breath Dermatological: negative for rash Respiratory: negative for cough or wheezing Urologic: negative for hematuria Abdominal: negative for nausea, vomiting, diarrhea, bright red blood per rectum, melena, or hematemesis Neurologic: negative for visual changes, syncope, or dizziness All other systems reviewed and are otherwise  negative except as noted above.    Blood pressure 146/81, pulse 82, height 6\' 1"  (1.854 m), weight 256 lb 3.2 oz (116.212 kg).  General appearance: alert and no distress Neck: no adenopathy, no carotid bruit, no JVD, supple, symmetrical, trachea midline and thyroid not enlarged, symmetric, no tenderness/mass/nodules Lungs: clear to auscultation bilaterally Heart: regular rate and rhythm, S1, S2 normal, no murmur, click, rub or gallop Extremities: extremities normal, atraumatic, no cyanosis or edema  EKG not performed today  ASSESSMENT AND PLAN:   CLAUDICATION, INTERMITTENT Mr. Willems underwent angiography and attempted intervention on his left ossified occluded SFA 12/07/12. This was unsuccessful. He had two-vessel runoff below his knee bilaterally with a long calcified 90% segmental mid right SFA stenosis as well. I'm going to him on Pletal 50 mg by mouth twice a day since he does not have to continue his best position options and will have him see a mid-level provider back in 3 months and me back in 6 months  HYPERTENSION Well-controlled on current medications  HYPERCHOLESTEROLEMIA On statin therapy. We will check a lipid and liver profile      [name] MD [facility], [facility] 12/23/2012 9:31 AM

## 2012-12-23 NOTE — Assessment & Plan Note (Signed)
Well-controlled on current medications 

## 2012-12-23 NOTE — Assessment & Plan Note (Signed)
On statin therapy. We will check a lipid and liver profile 

## 2012-12-23 NOTE — Assessment & Plan Note (Signed)
[name] [name] underwent angiography and attempted intervention on his left ossified occluded SFA 12/07/12. This was unsuccessful. He had two-vessel runoff below his knee bilaterally with a long calcified 90% segmental mid right SFA stenosis as well. I'm going to him on Pletal 50 mg by mouth twice a day since he does not have to continue his best position options and will have him see a mid-level provider back in 3 months and me back in 6 months

## 2013-01-01 ENCOUNTER — Inpatient Hospital Stay (HOSPITAL_COMMUNITY)
Admission: EM | Admit: 2013-01-01 | Discharge: 2013-01-10 | DRG: 233 | Disposition: A | Discharge status: Home / Self Care | Payer: Medicare Other | Attending: Thoracic Surgery (Cardiothoracic Vascular Surgery) | Admitting: Thoracic Surgery (Cardiothoracic Vascular Surgery)

## 2013-01-01 ENCOUNTER — Emergency Department (HOSPITAL_COMMUNITY): Payer: Medicare Other

## 2013-01-01 ENCOUNTER — Encounter (HOSPITAL_COMMUNITY): Payer: Self-pay | Admitting: Emergency Medicine

## 2013-01-01 DIAGNOSIS — I2582 Chronic total occlusion of coronary artery: Secondary | ICD-10-CM | POA: Diagnosis present

## 2013-01-01 DIAGNOSIS — R339 Retention of urine, unspecified: Secondary | ICD-10-CM | POA: Diagnosis not present

## 2013-01-01 DIAGNOSIS — K219 Gastro-esophageal reflux disease without esophagitis: Secondary | ICD-10-CM | POA: Diagnosis present

## 2013-01-01 DIAGNOSIS — Z9861 Coronary angioplasty status: Secondary | ICD-10-CM

## 2013-01-01 DIAGNOSIS — I509 Heart failure, unspecified: Secondary | ICD-10-CM | POA: Diagnosis present

## 2013-01-01 DIAGNOSIS — D62 Acute posthemorrhagic anemia: Secondary | ICD-10-CM | POA: Diagnosis not present

## 2013-01-01 DIAGNOSIS — I10 Essential (primary) hypertension: Secondary | ICD-10-CM | POA: Diagnosis present

## 2013-01-01 DIAGNOSIS — F05 Delirium due to known physiological condition: Secondary | ICD-10-CM | POA: Diagnosis not present

## 2013-01-01 DIAGNOSIS — I2510 Atherosclerotic heart disease of native coronary artery without angina pectoris: Secondary | ICD-10-CM | POA: Diagnosis present

## 2013-01-01 DIAGNOSIS — D72829 Elevated white blood cell count, unspecified: Secondary | ICD-10-CM | POA: Diagnosis not present

## 2013-01-01 DIAGNOSIS — C61 Malignant neoplasm of prostate: Secondary | ICD-10-CM | POA: Diagnosis present

## 2013-01-01 DIAGNOSIS — Z79899 Other long term (current) drug therapy: Secondary | ICD-10-CM

## 2013-01-01 DIAGNOSIS — I739 Peripheral vascular disease, unspecified: Secondary | ICD-10-CM | POA: Diagnosis present

## 2013-01-01 DIAGNOSIS — I5031 Acute diastolic (congestive) heart failure: Secondary | ICD-10-CM | POA: Diagnosis present

## 2013-01-01 DIAGNOSIS — Z794 Long term (current) use of insulin: Secondary | ICD-10-CM

## 2013-01-01 DIAGNOSIS — E119 Type 2 diabetes mellitus without complications: Secondary | ICD-10-CM

## 2013-01-01 DIAGNOSIS — T82897A Other specified complication of cardiac prosthetic devices, implants and grafts, initial encounter: Secondary | ICD-10-CM | POA: Diagnosis present

## 2013-01-01 DIAGNOSIS — E785 Hyperlipidemia, unspecified: Secondary | ICD-10-CM | POA: Diagnosis present

## 2013-01-01 DIAGNOSIS — R0902 Hypoxemia: Secondary | ICD-10-CM | POA: Diagnosis present

## 2013-01-01 DIAGNOSIS — E1142 Type 2 diabetes mellitus with diabetic polyneuropathy: Secondary | ICD-10-CM | POA: Diagnosis present

## 2013-01-01 DIAGNOSIS — I214 Non-ST elevation (NSTEMI) myocardial infarction: Principal | ICD-10-CM | POA: Diagnosis not present

## 2013-01-01 DIAGNOSIS — Z23 Encounter for immunization: Secondary | ICD-10-CM

## 2013-01-01 DIAGNOSIS — Z87891 Personal history of nicotine dependence: Secondary | ICD-10-CM

## 2013-01-01 DIAGNOSIS — D696 Thrombocytopenia, unspecified: Secondary | ICD-10-CM | POA: Diagnosis not present

## 2013-01-01 DIAGNOSIS — I70219 Atherosclerosis of native arteries of extremities with intermittent claudication, unspecified extremity: Secondary | ICD-10-CM | POA: Diagnosis present

## 2013-01-01 DIAGNOSIS — J449 Chronic obstructive pulmonary disease, unspecified: Secondary | ICD-10-CM

## 2013-01-01 DIAGNOSIS — I252 Old myocardial infarction: Secondary | ICD-10-CM

## 2013-01-01 DIAGNOSIS — E1169 Type 2 diabetes mellitus with other specified complication: Secondary | ICD-10-CM | POA: Diagnosis present

## 2013-01-01 DIAGNOSIS — N189 Chronic kidney disease, unspecified: Secondary | ICD-10-CM | POA: Diagnosis present

## 2013-01-01 DIAGNOSIS — E7800 Pure hypercholesterolemia, unspecified: Secondary | ICD-10-CM | POA: Diagnosis present

## 2013-01-01 DIAGNOSIS — J9819 Other pulmonary collapse: Secondary | ICD-10-CM | POA: Diagnosis not present

## 2013-01-01 DIAGNOSIS — Z951 Presence of aortocoronary bypass graft: Secondary | ICD-10-CM

## 2013-01-01 DIAGNOSIS — M19049 Primary osteoarthritis, unspecified hand: Secondary | ICD-10-CM | POA: Diagnosis present

## 2013-01-01 DIAGNOSIS — Z7982 Long term (current) use of aspirin: Secondary | ICD-10-CM

## 2013-01-01 DIAGNOSIS — F29 Unspecified psychosis not due to a substance or known physiological condition: Secondary | ICD-10-CM | POA: Diagnosis not present

## 2013-01-01 DIAGNOSIS — J811 Chronic pulmonary edema: Secondary | ICD-10-CM

## 2013-01-01 DIAGNOSIS — I129 Hypertensive chronic kidney disease with stage 1 through stage 4 chronic kidney disease, or unspecified chronic kidney disease: Secondary | ICD-10-CM | POA: Diagnosis present

## 2013-01-01 DIAGNOSIS — I7389 Other specified peripheral vascular diseases: Secondary | ICD-10-CM

## 2013-01-01 DIAGNOSIS — Z8546 Personal history of malignant neoplasm of prostate: Secondary | ICD-10-CM

## 2013-01-01 DIAGNOSIS — R404 Transient alteration of awareness: Secondary | ICD-10-CM | POA: Diagnosis not present

## 2013-01-01 DIAGNOSIS — I200 Unstable angina: Secondary | ICD-10-CM | POA: Diagnosis present

## 2013-01-01 LAB — BASIC METABOLIC PANEL
CO2: 23 mEq/L (ref 19–32)
Calcium: 9.1 mg/dL (ref 8.4–10.5)
Chloride: 103 mEq/L (ref 96–112)
Creatinine, Ser: 1.05 mg/dL (ref 0.50–1.35)
Glucose, Bld: 244 mg/dL — ABNORMAL HIGH (ref 70–99)
Potassium: 4 mEq/L (ref 3.5–5.1)

## 2013-01-01 LAB — CBC
HCT: 41.3 % (ref 39.0–52.0)
Hemoglobin: 14.2 g/dL (ref 13.0–17.0)
MCH: 30.8 pg (ref 26.0–34.0)
MCV: 89.6 fL (ref 78.0–100.0)
RBC: 4.61 MIL/uL (ref 4.22–5.81)
RDW: 13.4 % (ref 11.5–15.5)
WBC: 10.6 10*3/uL — ABNORMAL HIGH (ref 4.0–10.5)

## 2013-01-01 LAB — PRO B NATRIURETIC PEPTIDE: Pro B Natriuretic peptide (BNP): 256.3 pg/mL (ref 0–450)

## 2013-01-01 LAB — POCT I-STAT TROPONIN I: Troponin i, poc: 0.08 ng/mL (ref 0.00–0.08)

## 2013-01-01 LAB — D-DIMER, QUANTITATIVE: D-Dimer, Quant: 0.73 ug/mL-FEU — ABNORMAL HIGH (ref 0.00–0.48)

## 2013-01-01 MED ORDER — IOHEXOL 350 MG/ML SOLN
80.0000 mL | Freq: Once | INTRAVENOUS | Status: AC | PRN
  Administered 2013-01-01: 80 mL via INTRAVENOUS

## 2013-01-01 MED ORDER — ALBUTEROL (5 MG/ML) CONTINUOUS INHALATION SOLN
15.0000 mg/h | INHALATION_SOLUTION | RESPIRATORY_TRACT | Status: DC
  Administered 2013-01-02: 15 mg/h via RESPIRATORY_TRACT
  Filled 2013-01-01: qty 20

## 2013-01-01 MED ORDER — IPRATROPIUM BROMIDE 0.02 % IN SOLN
0.5000 mg | Freq: Once | RESPIRATORY_TRACT | Status: AC
  Administered 2013-01-01: 0.5 mg via RESPIRATORY_TRACT
  Filled 2013-01-01: qty 2.5

## 2013-01-01 MED ORDER — ASPIRIN 81 MG PO CHEW
324.0000 mg | CHEWABLE_TABLET | Freq: Once | ORAL | Status: AC
  Administered 2013-01-01 – 2013-01-02 (×2): 324 mg via ORAL
  Filled 2013-01-01: qty 4

## 2013-01-01 MED ORDER — METHYLPREDNISOLONE SODIUM SUCC 125 MG IJ SOLR
125.0000 mg | Freq: Once | INTRAMUSCULAR | Status: AC
  Administered 2013-01-02: 125 mg via INTRAVENOUS
  Filled 2013-01-01: qty 2

## 2013-01-01 MED ORDER — ALBUTEROL SULFATE (5 MG/ML) 0.5% IN NEBU
5.0000 mg | INHALATION_SOLUTION | Freq: Once | RESPIRATORY_TRACT | Status: AC
  Administered 2013-01-01: 5 mg via RESPIRATORY_TRACT
  Filled 2013-01-01: qty 1
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
## 2013-01-01 NOTE — ED Notes (Signed)
Pt. reports intermittent mid chest pain / tightness for 1 week with SOB and occasional productive cough , denies nausea or diaphoresis .

## 2013-01-02 DIAGNOSIS — I509 Heart failure, unspecified: Secondary | ICD-10-CM

## 2013-01-02 DIAGNOSIS — I10 Essential (primary) hypertension: Secondary | ICD-10-CM

## 2013-01-02 DIAGNOSIS — I5031 Acute diastolic (congestive) heart failure: Secondary | ICD-10-CM

## 2013-01-02 DIAGNOSIS — I200 Unstable angina: Secondary | ICD-10-CM

## 2013-01-02 DIAGNOSIS — J449 Chronic obstructive pulmonary disease, unspecified: Secondary | ICD-10-CM

## 2013-01-02 DIAGNOSIS — I739 Peripheral vascular disease, unspecified: Secondary | ICD-10-CM

## 2013-01-02 DIAGNOSIS — I2510 Atherosclerotic heart disease of native coronary artery without angina pectoris: Secondary | ICD-10-CM

## 2013-01-02 DIAGNOSIS — E119 Type 2 diabetes mellitus without complications: Secondary | ICD-10-CM

## 2013-01-02 DIAGNOSIS — R0902 Hypoxemia: Secondary | ICD-10-CM | POA: Diagnosis present

## 2013-01-02 LAB — TROPONIN I
Troponin I: 3.06 ng/mL (ref <0.30)
Troponin I: 2.97 ng/mL (ref <0.30)
Troponin I: 6.1 ng/mL (ref <0.30)

## 2013-01-02 LAB — GLUCOSE, CAPILLARY
Glucose-Capillary: 255 mg/dL — ABNORMAL HIGH (ref 70–99)
Glucose-Capillary: 332 mg/dL — ABNORMAL HIGH (ref 70–99)
Glucose-Capillary: 282 mg/dL — ABNORMAL HIGH (ref 70–99)
Glucose-Capillary: 223 mg/dL — ABNORMAL HIGH (ref 70–99)
Glucose-Capillary: 325 mg/dL — ABNORMAL HIGH (ref 70–99)

## 2013-01-02 LAB — TSH: TSH: 1.134 u[IU]/mL (ref 0.350–4.500)

## 2013-01-02 LAB — POCT I-STAT 3, ART BLOOD GAS (G3+)
Bicarbonate: 24 mEq/L (ref 20.0–24.0)
TCO2: 25 mmol/L (ref 0–100)
pCO2 arterial: 34.4 mmHg — ABNORMAL LOW (ref 35.0–45.0)
pH, Arterial: 7.451 — ABNORMAL HIGH (ref 7.350–7.450)

## 2013-01-02 LAB — HEPARIN LEVEL (UNFRACTIONATED): Heparin Unfractionated: 0.35 IU/mL (ref 0.30–0.70)

## 2013-01-02 LAB — HEMOGLOBIN A1C: Hgb A1c MFr Bld: 6.9 % — ABNORMAL HIGH (ref <5.7)

## 2013-01-02 LAB — MRSA PCR SCREENING: MRSA by PCR: NEGATIVE

## 2013-01-02 MED ORDER — HEPARIN (PORCINE) IN NACL 100-0.45 UNIT/ML-% IJ SOLN
1350.0000 [IU]/h | INTRAMUSCULAR | Status: DC
  Administered 2013-01-02: 1350 [IU]/h via INTRAVENOUS
  Filled 2013-01-02 (×3): qty 250

## 2013-01-02 MED ORDER — INSULIN ASPART 100 UNIT/ML ~~LOC~~ SOLN
0.0000 [IU] | SUBCUTANEOUS | Status: DC
  Administered 2013-01-02: 5 [IU] via SUBCUTANEOUS
  Administered 2013-01-02 (×2): 7 [IU] via SUBCUTANEOUS
  Administered 2013-01-02: 5 [IU] via SUBCUTANEOUS
  Administered 2013-01-02: 3 [IU] via SUBCUTANEOUS
  Administered 2013-01-02: 5 [IU] via SUBCUTANEOUS
  Administered 2013-01-03 (×2): 2 [IU] via SUBCUTANEOUS
  Administered 2013-01-03: 3 [IU] via SUBCUTANEOUS
  Administered 2013-01-03: 5 [IU] via SUBCUTANEOUS
  Administered 2013-01-03: 2 [IU] via SUBCUTANEOUS
  Administered 2013-01-04: 01:00:00 3 [IU] via SUBCUTANEOUS
  Filled 2013-01-02: qty 1

## 2013-01-02 MED ORDER — ENOXAPARIN SODIUM 40 MG/0.4ML ~~LOC~~ SOLN
40.0000 mg | Freq: Every day | SUBCUTANEOUS | Status: DC
  Filled 2013-01-02: qty 0.4

## 2013-01-02 MED ORDER — IPRATROPIUM BROMIDE 0.02 % IN SOLN: 0.5000 mg | RESPIRATORY_TRACT | Status: DC | PRN

## 2013-01-02 MED ORDER — NITROGLYCERIN 0.4 MG SL SUBL
SUBLINGUAL_TABLET | SUBLINGUAL | Status: AC
  Administered 2013-01-02: 0.4 mg via SUBLINGUAL
  Filled 2013-01-02: qty 25

## 2013-01-02 MED ORDER — SODIUM CHLORIDE 0.9 % IJ SOLN: 3.0000 mL | Freq: Two times a day (BID) | INTRAMUSCULAR | Status: DC

## 2013-01-02 MED ORDER — SODIUM CHLORIDE 0.9 % IV SOLN: 250.0000 mL | INTRAVENOUS | Status: DC | PRN

## 2013-01-02 MED ORDER — NITROGLYCERIN 0.4 MG SL SUBL
SUBLINGUAL_TABLET | SUBLINGUAL | Status: AC
  Administered 2013-01-02: 0.4 mg
  Filled 2013-01-02: qty 25

## 2013-01-02 MED ORDER — POTASSIUM CHLORIDE CRYS ER 20 MEQ PO TBCR
20.0000 meq | EXTENDED_RELEASE_TABLET | Freq: Two times a day (BID) | ORAL | Status: AC
  Administered 2013-01-02 – 2013-01-03 (×4): 20 meq via ORAL
  Filled 2013-01-02 (×6): qty 1

## 2013-01-02 MED ORDER — METHYLPREDNISOLONE SODIUM SUCC 125 MG IJ SOLR
80.0000 mg | Freq: Two times a day (BID) | INTRAMUSCULAR | Status: AC
  Administered 2013-01-02 (×2): 80 mg via INTRAVENOUS
  Filled 2013-01-02 (×3): qty 1.28

## 2013-01-02 MED ORDER — IPRATROPIUM BROMIDE 0.02 % IN SOLN
0.5000 mg | RESPIRATORY_TRACT | Status: DC
  Administered 2013-01-02 (×3): 0.5 mg via RESPIRATORY_TRACT
  Filled 2013-01-02 (×3): qty 2.5

## 2013-01-02 MED ORDER — ALBUTEROL SULFATE (5 MG/ML) 0.5% IN NEBU: 2.5000 mg | INHALATION_SOLUTION | RESPIRATORY_TRACT | Status: DC | PRN

## 2013-01-02 MED ORDER — FUROSEMIDE 10 MG/ML IJ SOLN
40.0000 mg | Freq: Once | INTRAMUSCULAR | Status: AC
  Administered 2013-01-02: 40 mg via INTRAVENOUS
  Filled 2013-01-02: qty 4

## 2013-01-02 MED ORDER — ACETAMINOPHEN 325 MG PO TABS
650.0000 mg | ORAL_TABLET | ORAL | Status: DC | PRN
Start: 2013-01-02 — End: 2013-01-05

## 2013-01-02 MED ORDER — FUROSEMIDE 10 MG/ML IJ SOLN
40.0000 mg | Freq: Two times a day (BID) | INTRAMUSCULAR | Status: DC
  Administered 2013-01-02 – 2013-01-03 (×3): 40 mg via INTRAVENOUS
  Filled 2013-01-02 (×5): qty 4

## 2013-01-02 MED ORDER — SODIUM CHLORIDE 0.9 % IJ SOLN: 3.0000 mL | INTRAMUSCULAR | Status: DC | PRN

## 2013-01-02 MED ORDER — ONDANSETRON HCL 4 MG/2ML IJ SOLN: 4.0000 mg | Freq: Four times a day (QID) | INTRAMUSCULAR | Status: DC | PRN

## 2013-01-02 MED ORDER — CARVEDILOL 6.25 MG PO TABS
6.2500 mg | ORAL_TABLET | Freq: Two times a day (BID) | ORAL | Status: DC
  Administered 2013-01-02 – 2013-01-04 (×6): 6.25 mg via ORAL
  Filled 2013-01-02 (×7): qty 1
  Filled 2013-01-02: qty 2
  Filled 2013-01-02 (×4): qty 1

## 2013-01-02 MED ORDER — ASPIRIN 81 MG PO CHEW
CHEWABLE_TABLET | ORAL | Status: AC
  Administered 2013-01-02: 324 mg via ORAL
  Filled 2013-01-02: qty 4

## 2013-01-02 MED ORDER — CARVEDILOL 3.125 MG PO TABS
3.1250 mg | ORAL_TABLET | Freq: Two times a day (BID) | ORAL | Status: DC
  Filled 2013-01-02 (×3): qty 1

## 2013-01-02 MED ORDER — NITROGLYCERIN IN D5W 200-5 MCG/ML-% IV SOLN
10.0000 ug/min | INTRAVENOUS | Status: DC
  Administered 2013-01-02: 10 ug/min via INTRAVENOUS
  Filled 2013-01-02: qty 250

## 2013-01-02 MED ORDER — HEPARIN BOLUS VIA INFUSION
4000.0000 [IU] | Freq: Once | INTRAVENOUS | Status: AC
  Administered 2013-01-02: 4000 [IU] via INTRAVENOUS
  Filled 2013-01-02: qty 4000

## 2013-01-02 MED ORDER — ALBUTEROL SULFATE (5 MG/ML) 0.5% IN NEBU
2.5000 mg | INHALATION_SOLUTION | RESPIRATORY_TRACT | Status: DC
  Administered 2013-01-02 (×3): 2.5 mg via RESPIRATORY_TRACT
  Filled 2013-01-02 (×3): qty 0.5

## 2013-01-02 MED ORDER — ASPIRIN EC 325 MG PO TBEC
325.0000 mg | DELAYED_RELEASE_TABLET | Freq: Every day | ORAL | Status: DC
  Administered 2013-01-03: 325 mg via ORAL
  Filled 2013-01-02 (×2): qty 1
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
## 2013-01-02 NOTE — Progress Notes (Signed)
Pt complaining of chestpain. Sublingual nitro given x1. Pt BP 171/21, HR 110-115 ST, RR 27, O2 91% on 3L n/c.Will continue to monitor pt. Will reassess pts CP.

## 2013-01-02 NOTE — Progress Notes (Signed)
*  PRELIMINARY RESULTS* Echocardiogram 2D Echocardiogram has been performed.  Jeryl Columbia 01/02/2013, 3:51 PM

## 2013-01-02 NOTE — Progress Notes (Signed)
Subjective/Objective Called by Elink, Patient with acute chest pain and changes on EKG. [name] [name] is a patient of Dr. Coralee Pesa (last evaluated on 12/23/2012) and has a history of coronary artery disease. Patient alert, oriented, states pain improved but not totally relieved after SL Nitroglycerin x 2.  Scheduled Meds: . ipratropium  0.5 mg Nebulization Q4H   And  . albuterol  2.5 mg Nebulization Q4H  . aspirin EC  325 mg Oral Daily  . carvedilol  6.25 mg Oral BID WC  . furosemide  40 mg Intravenous Q12H  . heparin  4,000 Units Intravenous Once  . insulin aspart  0-9 Units Subcutaneous Q4H  . methylPREDNISolone (SOLU-MEDROL) injection  80 mg Intravenous Q12H  . potassium chloride  20 mEq Oral BID  . sodium chloride  3 mL Intravenous Q12H   Continuous Infusions: . albuterol 15 mg/hr (01/02/13 0003)  . heparin     PRN Meds:sodium chloride, acetaminophen, ondansetron (ZOFRAN) IV, sodium chloride  Vital signs in last 24 hours: Temp:  [97.9 F (36.6 C)-98.6 F (37 C)] 98.1 F (36.7 C) (11/30 0429) Pulse Rate:  [78-124] 106 (11/30 0500) Resp:  [14-31] 23 (11/30 0500) BP: (124-188)/(65-121) 138/65 mmHg (11/30 0500) SpO2:  [88 %-100 %] 97 % (11/30 0500) Weight:  [114 kg (251 lb 5.2 oz)] 114 kg (251 lb 5.2 oz) (11/30 0241)  Intake/Output last 3 shifts:   Intake/Output this shift: Total I/O In: -  Out: 1100 [Urine:1100]  Physical Examination:  General appearance - alert, oriented, no acute distress Chest - clear to auscultation, no wheezes, rales or rhonchi, symmetric air entry, no tachypnea, retractions or cyanosis Heart - Sinus tachycardia Extremities - cool to touch, pedal pulses palpable.  Problem Assessment/Plan   Acute chest pain. EKG with ST changes. Patient has received NTG sublingual x 2. Given ASA, started on Carvedilol and a Heparin gtt. Cycling troponins. Cardiology has also been contacted.

## 2013-01-02 NOTE — Progress Notes (Signed)
Pt was reassessed post sublingual nitro pt stated that CP subsided pts vitas are still stable. EKG was done. PT is in Sinus T and has some ST abnormalities. MD paged will updated MD on pts current status. Will continue to monitor PT
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
## 2013-01-02 NOTE — Progress Notes (Signed)
Utilization review completed.  P.J. Antjuan Rothe,RN,BSN Case Manager [phone]  

## 2013-01-02 NOTE — Progress Notes (Signed)
Dr. Lovell Sheehan wants to defer BIPAP at this time.

## 2013-01-02 NOTE — Progress Notes (Signed)
eLink Physician-Brief Progress Note Patient Name: [name] [name] DOB: Sep 04, 1931 MRN: [id number]  Date of Service  01/02/2013   HPI/Events of Note   Pt with ongoing chest pain and ECG changes. Hx of CAD  eICU Interventions  See orders for heparin drip, ASA, beta blockade, cycle enzymes Calling [facility]   Intervention Category Major Interventions: Other:  Shan Levans 01/02/2013, 5:54 AM

## 2013-01-02 NOTE — Progress Notes (Signed)
   Thad Ranger ZOX:[id number] DOB: 1931-05-05 DOA: 01/01/2013 PCP: Dois Davenport., MD  Brief narrative: 79 y/o ?, known extensive PMH last cardiac catheterization performed 10/05/08 by Dr. Michele Mcalpine Nahser=occluded circumflex marginal branch at the site of prior stenting with an occluded AV groove circumflex He styarted to had ill-defined CP last PM in a setting of recent recent [place] with his family, however workup in the emergency room revealed no pulmonary embolism on CT angiogram. At 5 AM this morning, EKG showed dynamic ST depressions across precordial leads and patient was started empirically on heparin GTT and cardiology was consulted for potential NSTEMI. Repeat EKG 7:56 AM shows resolution of EKG changes from prior-echocardiogram is pending, repeat troponin is pending  Patient actually feels relatively comfortable at this time He has no current chest pain. He states that chest pain he had earlier was ill-defined going from his left chest his right shoulder. He has not had any recent gain in weight lower extremity edema or orthopnea He states that he has had these other symptoms in his chest for on and off for the past month He states that when he had his stent placed, the symptoms were nonspecific therefore he has no way of comparing the same  I spoke to Dr. Jomarie Longs of [facility] heart and vascular and updated him on patient's progress I appreciate his assistance in taking over the patient for possible ischemic workup vs. medical management and will defer further decision making to [facility] heart and vascular [facility] hospitalist will sign off.  Pleas Koch, MD [facility] [phone]

## 2013-01-02 NOTE — Progress Notes (Signed)
Pt Experiencing stabbing chest pain for a second time.[facility] Md paged. CCM paged CCM responded Orders to give to start heparin gtt, give betablocker, and 325 Asprin. Pt was also given another .4 sublingual nitro tablet. Pt was then assessed approx 5-63min after and CP started to subside. Will continue to monitor pt. Pts vital signs are stable.

## 2013-01-02 NOTE — Progress Notes (Signed)
Troponin levels called to Joesph Fillers, NP.Patient is pain free at present with stable vital signs.

## 2013-01-03 ENCOUNTER — Other Ambulatory Visit: Payer: Self-pay | Admitting: *Deleted

## 2013-01-03 ENCOUNTER — Encounter (HOSPITAL_COMMUNITY)
Admission: EM | Disposition: A | Discharge status: Home / Self Care | Payer: Self-pay | Source: Home / Self Care | Attending: Thoracic Surgery (Cardiothoracic Vascular Surgery)

## 2013-01-03 DIAGNOSIS — I214 Non-ST elevation (NSTEMI) myocardial infarction: Secondary | ICD-10-CM | POA: Diagnosis not present

## 2013-01-03 DIAGNOSIS — E7800 Pure hypercholesterolemia, unspecified: Secondary | ICD-10-CM

## 2013-01-03 DIAGNOSIS — I2510 Atherosclerotic heart disease of native coronary artery without angina pectoris: Secondary | ICD-10-CM

## 2013-01-03 DIAGNOSIS — Z01810 Encounter for preprocedural cardiovascular examination: Secondary | ICD-10-CM

## 2013-01-03 DIAGNOSIS — I7389 Other specified peripheral vascular diseases: Secondary | ICD-10-CM

## 2013-01-03 HISTORY — PX: LEFT HEART CATHETERIZATION WITH CORONARY ANGIOGRAM: SHX5451

## 2013-01-03 LAB — BASIC METABOLIC PANEL
BUN: 23 mg/dL (ref 6–23)
Calcium: 9.1 mg/dL (ref 8.4–10.5)
Creatinine, Ser: 1.24 mg/dL (ref 0.50–1.35)
GFR calc Af Amer: 62 mL/min — ABNORMAL LOW (ref >90)
GFR calc non Af Amer: 54 mL/min — ABNORMAL LOW (ref >90)

## 2013-01-03 LAB — CBC
Hemoglobin: 13.1 g/dL (ref 13.0–17.0)
MCH: 30.1 pg (ref 26.0–34.0)
MCHC: 33.1 g/dL (ref 30.0–36.0)
Platelets: 225 10*3/uL (ref 150–400)
RDW: 13.5 % (ref 11.5–15.5)

## 2013-01-03 LAB — GLUCOSE, CAPILLARY
Glucose-Capillary: 264 mg/dL — ABNORMAL HIGH (ref 70–99)
Glucose-Capillary: 244 mg/dL — ABNORMAL HIGH (ref 70–99)
Glucose-Capillary: 197 mg/dL — ABNORMAL HIGH (ref 70–99)

## 2013-01-03 LAB — HEPARIN LEVEL (UNFRACTIONATED): Heparin Unfractionated: 0.27 IU/mL — ABNORMAL LOW (ref 0.30–0.70)

## 2013-01-03 SURGERY — LEFT HEART CATHETERIZATION WITH CORONARY ANGIOGRAM
Anesthesia: LOCAL

## 2013-01-03 MED ORDER — HEPARIN SODIUM (PORCINE) 1000 UNIT/ML IJ SOLN
INTRAMUSCULAR | Status: AC
  Filled 2013-01-03: qty 1

## 2013-01-03 MED ORDER — SODIUM CHLORIDE 0.9 % IJ SOLN: 3.0000 mL | Freq: Two times a day (BID) | INTRAMUSCULAR | Status: DC

## 2013-01-03 MED ORDER — TAMSULOSIN HCL 0.4 MG PO CAPS
0.4000 mg | ORAL_CAPSULE | Freq: Every day | ORAL | Status: DC
  Administered 2013-01-03 – 2013-01-09 (×6): 0.4 mg via ORAL
  Filled 2013-01-03 (×8): qty 1

## 2013-01-03 MED ORDER — INSULIN ASPART 100 UNIT/ML ~~LOC~~ SOLN: 30.0000 [IU] | Freq: Once | SUBCUTANEOUS | Status: DC

## 2013-01-03 MED ORDER — ASPIRIN 81 MG PO CHEW
81.0000 mg | CHEWABLE_TABLET | Freq: Every day | ORAL | Status: DC
  Administered 2013-01-04: 81 mg via ORAL
  Filled 2013-01-03 (×2): qty 1

## 2013-01-03 MED ORDER — METOPROLOL TARTRATE 12.5 MG HALF TABLET
12.5000 mg | ORAL_TABLET | Freq: Once | ORAL | Status: AC
  Administered 2013-01-05: 07:00:00 12.5 mg via ORAL
  Filled 2013-01-03: qty 1

## 2013-01-03 MED ORDER — SODIUM CHLORIDE 0.9 % IJ SOLN: 3.0000 mL | INTRAMUSCULAR | Status: DC | PRN

## 2013-01-03 MED ORDER — ASPIRIN EC 81 MG PO TBEC
81.0000 mg | DELAYED_RELEASE_TABLET | Freq: Every day | ORAL | Status: DC
  Filled 2013-01-03 (×2): qty 1

## 2013-01-03 MED ORDER — CHLORHEXIDINE GLUCONATE 4 % EX LIQD
60.0000 mL | Freq: Once | CUTANEOUS | Status: DC
  Filled 2013-01-03: qty 60

## 2013-01-03 MED ORDER — SODIUM CHLORIDE 0.9 % IV SOLN: INTRAVENOUS | Status: AC

## 2013-01-03 MED ORDER — AMLODIPINE BESYLATE 10 MG PO TABS
10.0000 mg | ORAL_TABLET | Freq: Every day | ORAL | Status: DC
  Administered 2013-01-03 – 2013-01-04 (×2): 10 mg via ORAL
  Filled 2013-01-03 (×3): qty 1

## 2013-01-03 MED ORDER — SODIUM CHLORIDE 0.9 % IJ SOLN
3.0000 mL | Freq: Two times a day (BID) | INTRAMUSCULAR | Status: DC
  Administered 2013-01-03 – 2013-01-04 (×2): 3 mL via INTRAVENOUS

## 2013-01-03 MED ORDER — MORPHINE SULFATE 2 MG/ML IJ SOLN: 1.0000 mg | INTRAMUSCULAR | Status: DC | PRN

## 2013-01-03 MED ORDER — CHLORHEXIDINE GLUCONATE 4 % EX LIQD
60.0000 mL | Freq: Once | CUTANEOUS | Status: AC
  Administered 2013-01-04: 4 via TOPICAL
  Filled 2013-01-03: qty 60

## 2013-01-03 MED ORDER — METFORMIN HCL 500 MG PO TABS
500.0000 mg | ORAL_TABLET | Freq: Two times a day (BID) | ORAL | Status: DC
Start: 2013-01-03 — End: 2013-01-03
  Filled 2013-01-03 (×2): qty 1

## 2013-01-03 MED ORDER — SODIUM CHLORIDE 0.9 % IV SOLN: INTRAVENOUS | Status: DC

## 2013-01-03 MED ORDER — VERAPAMIL HCL 2.5 MG/ML IV SOLN
INTRAVENOUS | Status: AC
  Filled 2013-01-03: qty 2

## 2013-01-03 MED ORDER — ONDANSETRON HCL 4 MG/2ML IJ SOLN
4.0000 mg | Freq: Four times a day (QID) | INTRAMUSCULAR | Status: DC | PRN
Start: 2013-01-03 — End: 2013-01-05

## 2013-01-03 MED ORDER — HEPARIN (PORCINE) IN NACL 2-0.9 UNIT/ML-% IJ SOLN
INTRAMUSCULAR | Status: AC
  Filled 2013-01-03: qty 1000

## 2013-01-03 MED ORDER — ALPRAZOLAM 0.25 MG PO TABS: 0.2500 mg | ORAL_TABLET | ORAL | Status: DC | PRN

## 2013-01-03 MED ORDER — MIDAZOLAM HCL 2 MG/2ML IJ SOLN
INTRAMUSCULAR | Status: AC
  Filled 2013-01-03: qty 2

## 2013-01-03 MED ORDER — HEPARIN (PORCINE) IN NACL 100-0.45 UNIT/ML-% IJ SOLN
1500.0000 [IU]/h | INTRAMUSCULAR | Status: DC
  Administered 2013-01-03: 1500 [IU]/h via INTRAVENOUS

## 2013-01-03 MED ORDER — TEMAZEPAM 15 MG PO CAPS: 15.0000 mg | ORAL_CAPSULE | Freq: Once | ORAL | Status: AC | PRN

## 2013-01-03 MED ORDER — SODIUM CHLORIDE 0.9 % IV SOLN: 250.0000 mL | INTRAVENOUS | Status: DC | PRN

## 2013-01-03 MED ORDER — FENTANYL CITRATE 0.05 MG/ML IJ SOLN
INTRAMUSCULAR | Status: AC
  Filled 2013-01-03: qty 2

## 2013-01-03 MED ORDER — SIMVASTATIN 20 MG PO TABS
20.0000 mg | ORAL_TABLET | Freq: Every day | ORAL | Status: DC
  Administered 2013-01-03 – 2013-01-09 (×6): 20 mg via ORAL
  Filled 2013-01-03 (×8): qty 1

## 2013-01-03 MED ORDER — INSULIN NPH (HUMAN) (ISOPHANE) 100 UNIT/ML ~~LOC~~ SUSP
10.0000 [IU] | Freq: Every day | SUBCUTANEOUS | Status: DC
  Administered 2013-01-03 – 2013-01-04 (×2): 10 [IU] via SUBCUTANEOUS

## 2013-01-03 MED ORDER — LISINOPRIL 20 MG PO TABS
20.0000 mg | ORAL_TABLET | Freq: Two times a day (BID) | ORAL | Status: DC
  Administered 2013-01-03 – 2013-01-04 (×3): 20 mg via ORAL
  Filled 2013-01-03 (×5): qty 1

## 2013-01-03 MED ORDER — BISACODYL 5 MG PO TBEC
5.0000 mg | DELAYED_RELEASE_TABLET | Freq: Once | ORAL | Status: AC
  Administered 2013-01-04: 5 mg via ORAL
  Filled 2013-01-03: qty 1

## 2013-01-03 MED ORDER — PNEUMOCOCCAL VAC POLYVALENT 25 MCG/0.5ML IJ INJ
0.5000 mL | INJECTION | INTRAMUSCULAR | Status: AC
  Administered 2013-01-04: 12:00:00 0.5 mL via INTRAMUSCULAR
  Filled 2013-01-03: qty 0.5

## 2013-01-03 MED ORDER — ASPIRIN 81 MG PO CHEW: 81.0000 mg | CHEWABLE_TABLET | ORAL | Status: DC

## 2013-01-03 MED ORDER — ACETAMINOPHEN 325 MG PO TABS: 650.0000 mg | ORAL_TABLET | ORAL | Status: DC | PRN

## 2013-01-03 MED ORDER — INSULIN NPH (HUMAN) (ISOPHANE) 100 UNIT/ML ~~LOC~~ SUSP
10.0000 [IU] | Freq: Every day | SUBCUTANEOUS | Status: DC
  Administered 2013-01-04: 10 [IU] via SUBCUTANEOUS
  Filled 2013-01-03 (×2): qty 10

## 2013-01-03 MED ORDER — DIAZEPAM 2 MG PO TABS
2.0000 mg | ORAL_TABLET | Freq: Once | ORAL | Status: AC
  Administered 2013-01-05: 2 mg via ORAL
  Filled 2013-01-03: qty 1

## 2013-01-03 MED ORDER — METFORMIN HCL 500 MG PO TABS
500.0000 mg | ORAL_TABLET | Freq: Two times a day (BID) | ORAL | Status: DC
  Filled 2013-01-03: qty 1

## 2013-01-03 MED ORDER — LIDOCAINE HCL (PF) 1 % IJ SOLN
INTRAMUSCULAR | Status: AC
  Filled 2013-01-03: qty 30

## 2013-01-03 MED ORDER — NITROGLYCERIN 0.2 MG/ML ON CALL CATH LAB
INTRAVENOUS | Status: AC
  Filled 2013-01-03: qty 1

## 2013-01-03 NOTE — Care Management Note (Signed)
    Page 1 of 2   01/10/2013     4:52:37 PM   CARE MANAGEMENT NOTE 01/10/2013  Patient:  [name] [name]   Account Number:  [id number]  Date Initiated:  01/03/2013  Documentation initiated by:  [facility]  Subjective/Objective Assessment:   Admitted with pulmonary edema. on IV heparin and NTG.     Action/Plan:   Anticipated DC Date:  01/10/2013   Anticipated DC Plan:  HOME W HOME HEALTH SERVICES      DC Planning Services  CM consult      Choice offered to / List presented to:     DME arranged  Levan Hurst      DME agency  [facility]        Status of service:  Completed, signed off Medicare Important Message given?   (If response is "NO", the following Medicare IM given date fields will be blank) Date Medicare IM given:   Date Additional Medicare IM given:    Discharge Disposition:  HOME/SELF CARE  Per UR Regulation:  Reviewed for med. necessity/level of care/duration of stay  If discussed at Long Length of Stay Meetings, dates discussed:    Comments:  Contact:  [name] [name] Spouse [phone]                 [name] [name] Relative [phone]  01/10/13 [name] Slavick,RN,BSN [phone] PT FOR DC HOME TODAY.  REQUESTS RW FOR HOME; REFERRAL TO [facility] FOR DME NEEDS.  NO OTHER DC NEEDS IDENTIFIED.  01/06/13 1458 [name] Mayo RN MSN BSN CCM Transferred to 2S s/p CABG x 4.  01-03-13 1:20pm [name] [name], RNBSN [phone] Patient off unit at this time - to cath lab.

## 2013-01-03 NOTE — Progress Notes (Signed)
Subjective:  No further CP/SOB  Objective:  Temp:  [97.2 F (36.2 C)-99.6 F (37.6 C)] 98.4 F (36.9 C) (12/01 0742) Pulse Rate:  [71-102] 78 (12/01 0600) Resp:  [17-31] 22 (12/01 0600) BP: (111-171)/(54-139) 126/79 mmHg (12/01 0600) SpO2:  [92 %-98 %] 96 % (12/01 0600) Weight:  [251 lb 5.2 oz (114 kg)] 251 lb 5.2 oz (114 kg) (12/01 0500) Weight change: 0 lb (0 kg)  Intake/Output from previous day: 11/30 0701 - 12/01 0700 In: 340.3 [I.V.:340.3] Out: 2450 [Urine:2450]  Intake/Output from this shift:    Physical Exam: General appearance: alert and no distress Neck: no adenopathy, no carotid bruit, no JVD, supple, symmetrical, trachea midline and thyroid not enlarged, symmetric, no tenderness/mass/nodules Lungs: clear to auscultation bilaterally Heart: regular rate and rhythm, S1, S2 normal, no murmur, click, rub or gallop Extremities: extremities normal, atraumatic, no cyanosis or edema  Lab Results: Results for orders placed during the hospital encounter of 01/01/13 (from the past 48 hour(s))  CBC     Status: Abnormal   Collection Time    01/01/13  8:35 PM      Result Value Range   WBC 10.6 (*) 4.0 - 10.5 K/uL   RBC 4.61  4.22 - 5.81 MIL/uL   Hemoglobin 14.2  13.0 - 17.0 g/dL   HCT 16.1  09.6 - 04.5 %   MCV 89.6  78.0 - 100.0 fL   MCH 30.8  26.0 - 34.0 pg   MCHC 34.4  30.0 - 36.0 g/dL   RDW 40.9  81.1 - 91.4 %   Platelets 221  150 - 400 K/uL  BASIC METABOLIC PANEL     Status: Abnormal   Collection Time    01/01/13  8:35 PM      Result Value Range   Sodium 139  135 - 145 mEq/L   Potassium 4.0  3.5 - 5.1 mEq/L   Chloride 103  96 - 112 mEq/L   CO2 23  19 - 32 mEq/L   Glucose, Bld 244 (*) 70 - 99 mg/dL   BUN 13  6 - 23 mg/dL   Creatinine, Ser 7.82  0.50 - 1.35 mg/dL   Calcium 9.1  8.4 - 95.6 mg/dL   GFR calc non Af Amer 65 (*) >90 mL/min   GFR calc Af Amer 76 (*) >90 mL/min   Comment: (NOTE)     The eGFR has been calculated using the CKD EPI equation.       This calculation has not been validated in all clinical situations.     eGFR's persistently <90 mL/min signify possible Chronic Kidney     Disease.  PRO B NATRIURETIC PEPTIDE     Status: None   Collection Time    01/01/13  8:36 PM      Result Value Range   Pro B Natriuretic peptide (BNP) 256.3  0 - 450 pg/mL  POCT I-STAT TROPONIN I     Status: None   Collection Time    01/01/13  8:46 PM      Result Value Range   Troponin i, poc 0.08  0.00 - 0.08 ng/mL   Comment 3            Comment: Due to the release kinetics of cTnI,     a negative result within the first hours     of the onset of symptoms does not rule out     myocardial infarction with certainty.     If myocardial  infarction is still suspected,     repeat the test at appropriate intervals.  D-DIMER, QUANTITATIVE     Status: Abnormal   Collection Time    01/01/13  9:36 PM      Result Value Range   D-Dimer, Quant 0.73 (*) 0.00 - 0.48 ug/mL-FEU   Comment:            AT THE INHOUSE ESTABLISHED CUTOFF     VALUE OF 0.48 ug/mL FEU,     THIS ASSAY HAS BEEN DOCUMENTED     IN THE LITERATURE TO HAVE     A SENSITIVITY AND NEGATIVE     PREDICTIVE VALUE OF AT LEAST     98 TO 99%.  THE TEST RESULT     SHOULD BE CORRELATED WITH     AN ASSESSMENT OF THE CLINICAL     PROBABILITY OF DVT / VTE.  POCT I-STAT 3, BLOOD GAS (G3+)     Status: Abnormal   Collection Time    01/02/13 12:22 AM      Result Value Range   pH, Arterial 7.451 (*) 7.350 - 7.450   pCO2 arterial 34.4 (*) 35.0 - 45.0 mmHg   pO2, Arterial 93.0  80.0 - 100.0 mmHg   Bicarbonate 24.0  20.0 - 24.0 mEq/L   TCO2 25  0 - 100 mmol/L   O2 Saturation 98.0     Patient temperature 98.6 F     Collection site RADIAL, ALLEN'S TEST ACCEPTABLE     Drawn by RT     Sample type ARTERIAL    GLUCOSE, CAPILLARY     Status: Abnormal   Collection Time    01/02/13  1:49 AM      Result Value Range   Glucose-Capillary 255 (*) 70 - 99 mg/dL  MRSA PCR SCREENING     Status: None    Collection Time    01/02/13  2:24 AM      Result Value Range   MRSA by PCR NEGATIVE  NEGATIVE   Comment:            The GeneXpert MRSA Assay (FDA     approved for NASAL specimens     only), is one component of a     comprehensive MRSA colonization     surveillance program. It is not     intended to diagnose MRSA     infection nor to guide or     monitor treatment for     MRSA infections.  HEMOGLOBIN A1C     Status: Abnormal   Collection Time    01/02/13  4:13 AM      Result Value Range   Hemoglobin A1C 6.9 (*) <5.7 %   Comment: (NOTE)                                                                               According to the ADA Clinical Practice Recommendations for 2011, when     HbA1c is used as a screening test:      >=6.5%   Diagnostic of Diabetes Mellitus               (if abnormal result is confirmed)  5.7-6.4%   Increased risk of developing Diabetes Mellitus     References:Diagnosis and Classification of Diabetes Mellitus,Diabetes     Care,2011,34(Suppl 1):S62-S69 and Standards of Medical Care in             Diabetes - 2011,Diabetes Care,2011,34 (Suppl 1):S11-S61.   Mean Plasma Glucose 151 (*) <117 mg/dL   Comment: Performed at [facility]  TSH     Status: None   Collection Time    01/02/13  4:13 AM      Result Value Range   TSH 1.134  0.350 - 4.500 uIU/mL   Comment: Performed at [facility]  GLUCOSE, CAPILLARY     Status: Abnormal   Collection Time    01/02/13  4:39 AM      Result Value Range   Glucose-Capillary 332 (*) 70 - 99 mg/dL   Comment 1 Notify RN    GLUCOSE, CAPILLARY     Status: Abnormal   Collection Time    01/02/13  8:31 AM      Result Value Range   Glucose-Capillary 282 (*) 70 - 99 mg/dL  TROPONIN I     Status: Abnormal   Collection Time    01/02/13  9:34 AM      Result Value Range   Troponin I 3.06 (*) <0.30 ng/mL   Comment:            Due to the release kinetics of cTnI,     a negative result within the first hours       of the onset of symptoms does not rule out     myocardial infarction with certainty.     If myocardial infarction is still suspected,     repeat the test at appropriate intervals.     REPEATED TO VERIFY     CRITICAL RESULT CALLED TO, READ BACK BY AND VERIFIED WITH:     MACASERO M.,RN 01/02/13 1039 BY JONESJ  HEPARIN LEVEL (UNFRACTIONATED)     Status: None   Collection Time    01/02/13  9:34 AM      Result Value Range   Heparin Unfractionated 0.44  0.30 - 0.70 IU/mL   Comment:            IF HEPARIN RESULTS ARE BELOW     EXPECTED VALUES, AND PATIENT     DOSAGE HAS BEEN CONFIRMED,     SUGGEST FOLLOW UP TESTING     OF ANTITHROMBIN III LEVELS.  GLUCOSE, CAPILLARY     Status: Abnormal   Collection Time    01/02/13 11:08 AM      Result Value Range   Glucose-Capillary 223 (*) 70 - 99 mg/dL  TROPONIN I     Status: Abnormal   Collection Time    01/02/13  1:51 PM      Result Value Range   Troponin I 2.97 (*) <0.30 ng/mL   Comment:            Due to the release kinetics of cTnI,     a negative result within the first hours     of the onset of symptoms does not rule out     myocardial infarction with certainty.     If myocardial infarction is still suspected,     repeat the test at appropriate intervals.     REPEATED TO VERIFY     CRITICAL VALUE NOTED.  VALUE IS CONSISTENT WITH PREVIOUSLY REPORTED AND CALLED VALUE.  HEPARIN LEVEL (UNFRACTIONATED)  Status: None   Collection Time    01/02/13  2:00 PM      Result Value Range   Heparin Unfractionated 0.35  0.30 - 0.70 IU/mL   Comment:            IF HEPARIN RESULTS ARE BELOW     EXPECTED VALUES, AND PATIENT     DOSAGE HAS BEEN CONFIRMED,     SUGGEST FOLLOW UP TESTING     OF ANTITHROMBIN III LEVELS.  GLUCOSE, CAPILLARY     Status: Abnormal   Collection Time    01/02/13  3:46 PM      Result Value Range   Glucose-Capillary 258 (*) 70 - 99 mg/dL  TROPONIN I     Status: Abnormal   Collection Time    01/02/13  6:42 PM       Result Value Range   Troponin I 6.10 (*) <0.30 ng/mL   Comment:            Due to the release kinetics of cTnI,     a negative result within the first hours     of the onset of symptoms does not rule out     myocardial infarction with certainty.     If myocardial infarction is still suspected,     repeat the test at appropriate intervals.     REPEATED TO VERIFY     CRITICAL VALUE NOTED.  VALUE IS CONSISTENT WITH PREVIOUSLY REPORTED AND CALLED VALUE.  GLUCOSE, CAPILLARY     Status: Abnormal   Collection Time    01/02/13  7:56 PM      Result Value Range   Glucose-Capillary 325 (*) 70 - 99 mg/dL   Comment 1 Notify RN    GLUCOSE, CAPILLARY     Status: Abnormal   Collection Time    01/03/13 12:21 AM      Result Value Range   Glucose-Capillary 197 (*) 70 - 99 mg/dL   Comment 1 Notify RN    BASIC METABOLIC PANEL     Status: Abnormal   Collection Time    01/03/13  3:55 AM      Result Value Range   Sodium 137  135 - 145 mEq/L   Potassium 4.4  3.5 - 5.1 mEq/L   Chloride 101  96 - 112 mEq/L   CO2 23  19 - 32 mEq/L   Glucose, Bld 265 (*) 70 - 99 mg/dL   BUN 23  6 - 23 mg/dL   Creatinine, Ser 4.40  0.50 - 1.35 mg/dL   Calcium 9.1  8.4 - 10.2 mg/dL   GFR calc non Af Amer 54 (*) >90 mL/min   GFR calc Af Amer 62 (*) >90 mL/min   Comment: (NOTE)     The eGFR has been calculated using the CKD EPI equation.     This calculation has not been validated in all clinical situations.     eGFR's persistently <90 mL/min signify possible Chronic Kidney     Disease.  HEPARIN LEVEL (UNFRACTIONATED)     Status: Abnormal   Collection Time    01/03/13  3:55 AM      Result Value Range   Heparin Unfractionated 0.27 (*) 0.30 - 0.70 IU/mL   Comment:            IF HEPARIN RESULTS ARE BELOW     EXPECTED VALUES, AND PATIENT     DOSAGE HAS BEEN CONFIRMED,     SUGGEST FOLLOW UP TESTING  OF ANTITHROMBIN III LEVELS.  CBC     Status: Abnormal   Collection Time    01/03/13  3:55 AM      Result Value  Range   WBC 18.9 (*) 4.0 - 10.5 K/uL   RBC 4.35  4.22 - 5.81 MIL/uL   Hemoglobin 13.1  13.0 - 17.0 g/dL   HCT 16.1  09.6 - 04.5 %   MCV 91.0  78.0 - 100.0 fL   MCH 30.1  26.0 - 34.0 pg   MCHC 33.1  30.0 - 36.0 g/dL   RDW 40.9  81.1 - 91.4 %   Platelets 225  150 - 400 K/uL  GLUCOSE, CAPILLARY     Status: Abnormal   Collection Time    01/03/13  4:22 AM      Result Value Range   Glucose-Capillary 264 (*) 70 - 99 mg/dL   Comment 1 Notify RN      Imaging: Imaging results have been reviewed  Assessment/Plan:   1. Principal Problem: 2.   Unstable angina with acute ekg changes 3. Active Problems: 4.   DIABETES MELLITUS, TYPE II 5.   HYPERTENSION 6.   CORONARY ARTERY DISEASE 7.   PERIPHERAL VASCULAR DISEASE WITH CLAUDICATION - chronic total occlusion of mid left SFA, 90% calcified mid right SFA 8.   PROSTATE CANCER, HX OF 9.   Hypoxia 10.   Acute diastolic CHF (congestive heart failure) 11.   Time Spent Directly with Patient:  20 minutes  Length of Stay:  LOS: 2 days   On IV hep/NTG. + Trop / NSTEMI prob related to LCX-OM stent. Dynamic lateral TWI. Afeb but WBC 18K. Other labs OK. Exam benign. For cath via right radial approach later today by myself.    [name] 01/03/2013, 8:54 AM

## 2013-01-03 NOTE — CV Procedure (Signed)
[name] [name] is a 79 y.o. male    [id number] LOCATION:  FACILITY: [facility]  PHYSICIAN: Nanetta Batty, M.D. March 18, 1931   DATE OF PROCEDURE:  01/03/2013  DATE OF DISCHARGE:     CARDIAC CATHETERIZATION     History obtained from chart review.The patient is a 79 yo AA male who is overweight and who recently saw Dr. Allyson Sabal on 12/23/12 for evaluation of claudication and leg pain. He has a history of hypertension, hyperlipidemia, diabetes and CAD. His last cardiac catheterization performed 10/05/08 by Dr. Lina Sayre and revealed an occluded circumflex marginal branch at the site of the previous stent placed in May 2010. He did undergo angioplasty to that vessel in Spet 2010 . His ejection fraction was 50-55% with lateral wall hypokinesia. He has claudication there is bilateral and lifestyle limiting to less than 100 feet. Doppler studies performed her office suggest high-grade bilateral mid SFA disease. Dr. Allyson Sabal performed abdominal aortography with bifemoral runoff on 12/07/12 revealing two-vessel runoff below the knee bilaterally, long 90% segment segmental calcified mid right SFA stenosis and a chronic total occlusion of the mid left SFA which he attempted to cross unsuccessfully. The patient had a nuclear stress test on 11/17/12 which revealed a large, mostly fixed defect. There may be some periinfarct ischemia. This is in a territory of previously described MI and angioplasty and was considered intermediate risk. LVEF 40% with inferolateral hypokinesis.  The patient presented yesterday to the ER with chest pain which he says is similar to the pain in 2010 when he had the stent. [name] He reports it having CP for the last 2-3 weeks which gets worse with exertion . Yesterday it was 10/10 with radiation to both shoulders neck and arm. It was associated with nausea and SOB. He has had one negative troponin POC at 2046hrs yesterday. EKG with TWI in I, aVL which are not new but at 0500hrs today he had acute ST  depression in V 2-5. CT angio was negative for PE. No history of GIB. The patient denies vomiting, fever, orthopnea, dizziness, PND, abdominal pain, hematochezia, melena, lower extremity edema, claudication.  He had dynamic T wave inversion in the lateral leads and ultimately evolved positive troponins. Because of this he was brought to the Cath Lab today for diagnostic arctic catheterization via the right radial approach.    PROCEDURE DESCRIPTION:   The patient was brought to the second floor [facility] lab in the postabsorptive state. He was premedicated with Valium 5 mg by mouth, IV Versed and fentanyl. His right wrist was prepped and shaved in usual sterile fashion. Xylocaine 1% was used for local anesthesia. A 6 French sheath was inserted into the right radial artery using standard Seldinger technique. The patient received 5000 units  of heparin  intravenously.  Using a 5 [place] TIG catheter as as a 5 French pigtail catheters selective coronary angiography and left ventriculography were performed. Visipaque dye was used for the entirety of the case. Retrograde aortic, left ventricular pullback pressures were recorded. A total of 125 cc of contrast was administered to the patient.    HEMODYNAMICS:    AO SYSTOLIC/AO DIASTOLIC: 134/72   LV SYSTOLIC/LV DIASTOLIC: 134/13  ANGIOGRAPHIC RESULTS:   1. Left main; normal  2. LAD; 80-90% proximal, 50% segmental mid, 95% apical 3. Left circumflex; occluded stent in proximal OM1. The AV groove circumflex was occluded after the takeoff of the obtuse marginal branch until bite left to left collaterals..  4. Right coronary artery; codominant with  75-80% fairly focal mid. The PDA was a small vessel 5. Left ventriculography; RAO left ventriculogram was performed using  25 mL of Visipaque dye at 12 mL/second. The overall LVEF estimated  50 %  With wall motion abnormalities notable for mild to moderate posterolateral  hypokinesia  IMPRESSION:[name] [name] has an occluded circumflex obtuse marginal branch DES stent placed in May of 2010. The stent occluded 4 months after implantation and he needed to be re\re intervened on. He had a "non-STEMI" this admission with cath documenting progression of disease in his proximal LAD and mid RCA. Because his occluded at DES twice last 4 years I'm hesitant stent to his proximal LAD and favor complete revascularization using coronary artery bypass grafting. The radial sheath was removed and a TR band was placed on the right wrist to achieve patent hemostasis. The patient left the Cath Lab in stable condition. TCTS was notified.  [name] MD, [facility] 01/03/2013 2:21 PM

## 2013-01-03 NOTE — H&P (View-Only) (Signed)
   Subjective:  No further CP/SOB  Objective:  Temp:  [97.2 F (36.2 C)-99.6 F (37.6 C)] 98.4 F (36.9 C) (12/01 0742) Pulse Rate:  [71-102] 78 (12/01 0600) Resp:  [17-31] 22 (12/01 0600) BP: (111-171)/(54-139) 126/79 mmHg (12/01 0600) SpO2:  [92 %-98 %] 96 % (12/01 0600) Weight:  [251 lb 5.2 oz (114 kg)] 251 lb 5.2 oz (114 kg) (12/01 0500) Weight change: 0 lb (0 kg)  Intake/Output from previous day: 11/30 0701 - 12/01 0700 In: 340.3 [I.V.:340.3] Out: 2450 [Urine:2450]  Intake/Output from this shift:    Physical Exam: General appearance: alert and no distress Neck: no adenopathy, no carotid bruit, no JVD, supple, symmetrical, trachea midline and thyroid not enlarged, symmetric, no tenderness/mass/nodules Lungs: clear to auscultation bilaterally Heart: regular rate and rhythm, S1, S2 normal, no murmur, click, rub or gallop Extremities: extremities normal, atraumatic, no cyanosis or edema  Lab Results: Results for orders placed during the hospital encounter of 01/01/13 (from the past 48 hour(s))  CBC     Status: Abnormal   Collection Time    01/01/13  8:35 PM      Result Value Range   WBC 10.6 (*) 4.0 - 10.5 K/uL   RBC 4.61  4.22 - 5.81 MIL/uL   Hemoglobin 14.2  13.0 - 17.0 g/dL   HCT 41.3  39.0 - 52.0 %   MCV 89.6  78.0 - 100.0 fL   MCH 30.8  26.0 - 34.0 pg   MCHC 34.4  30.0 - 36.0 g/dL   RDW 13.4  11.5 - 15.5 %   Platelets 221  150 - 400 K/uL  BASIC METABOLIC PANEL     Status: Abnormal   Collection Time    01/01/13  8:35 PM      Result Value Range   Sodium 139  135 - 145 mEq/L   Potassium 4.0  3.5 - 5.1 mEq/L   Chloride 103  96 - 112 mEq/L   CO2 23  19 - 32 mEq/L   Glucose, Bld 244 (*) 70 - 99 mg/dL   BUN 13  6 - 23 mg/dL   Creatinine, Ser 1.05  0.50 - 1.35 mg/dL   Calcium 9.1  8.4 - 10.5 mg/dL   GFR calc non Af Amer 65 (*) >90 mL/min   GFR calc Af Amer 76 (*) >90 mL/min   Comment: (NOTE)     The eGFR has been calculated using the CKD EPI equation.       This calculation has not been validated in all clinical situations.     eGFR's persistently <90 mL/min signify possible Chronic Kidney     Disease.  PRO B NATRIURETIC PEPTIDE     Status: None   Collection Time    01/01/13  8:36 PM      Result Value Range   Pro B Natriuretic peptide (BNP) 256.3  0 - 450 pg/mL  POCT I-STAT TROPONIN I     Status: None   Collection Time    01/01/13  8:46 PM      Result Value Range   Troponin i, poc 0.08  0.00 - 0.08 ng/mL   Comment 3            Comment: Due to the release kinetics of cTnI,     a negative result within the first hours     of the onset of symptoms does not rule out     myocardial infarction with certainty.     If myocardial   infarction is still suspected,     repeat the test at appropriate intervals.  D-DIMER, QUANTITATIVE     Status: Abnormal   Collection Time    01/01/13  9:36 PM      Result Value Range   D-Dimer, Quant 0.73 (*) 0.00 - 0.48 ug/mL-FEU   Comment:            AT THE INHOUSE ESTABLISHED CUTOFF     VALUE OF 0.48 ug/mL FEU,     THIS ASSAY HAS BEEN DOCUMENTED     IN THE LITERATURE TO HAVE     A SENSITIVITY AND NEGATIVE     PREDICTIVE VALUE OF AT LEAST     98 TO 99%.  THE TEST RESULT     SHOULD BE CORRELATED WITH     AN ASSESSMENT OF THE CLINICAL     PROBABILITY OF DVT / VTE.  POCT I-STAT 3, BLOOD GAS (G3+)     Status: Abnormal   Collection Time    01/02/13 12:22 AM      Result Value Range   pH, Arterial 7.451 (*) 7.350 - 7.450   pCO2 arterial 34.4 (*) 35.0 - 45.0 mmHg   pO2, Arterial 93.0  80.0 - 100.0 mmHg   Bicarbonate 24.0  20.0 - 24.0 mEq/L   TCO2 25  0 - 100 mmol/L   O2 Saturation 98.0     Patient temperature 98.6 F     Collection site RADIAL, ALLEN'S TEST ACCEPTABLE     Drawn by RT     Sample type ARTERIAL    GLUCOSE, CAPILLARY     Status: Abnormal   Collection Time    01/02/13  1:49 AM      Result Value Range   Glucose-Capillary 255 (*) 70 - 99 mg/dL  MRSA PCR SCREENING     Status: None    Collection Time    01/02/13  2:24 AM      Result Value Range   MRSA by PCR NEGATIVE  NEGATIVE   Comment:            The GeneXpert MRSA Assay (FDA     approved for NASAL specimens     only), is one component of a     comprehensive MRSA colonization     surveillance program. It is not     intended to diagnose MRSA     infection nor to guide or     monitor treatment for     MRSA infections.  HEMOGLOBIN A1C     Status: Abnormal   Collection Time    01/02/13  4:13 AM      Result Value Range   Hemoglobin A1C 6.9 (*) <5.7 %   Comment: (NOTE)                                                                               According to the ADA Clinical Practice Recommendations for 2011, when     HbA1c is used as a screening test:      >=6.5%   Diagnostic of Diabetes Mellitus               (if abnormal result is confirmed)       5.7-6.4%   Increased risk of developing Diabetes Mellitus     References:Diagnosis and Classification of Diabetes Mellitus,Diabetes     Care,2011,34(Suppl 1):S62-S69 and Standards of Medical Care in             Diabetes - 2011,Diabetes Care,2011,34 (Suppl 1):S11-S61.   Mean Plasma Glucose 151 (*) <117 mg/dL   Comment: Performed at [facility]  TSH     Status: None   Collection Time    01/02/13  4:13 AM      Result Value Range   TSH 1.134  0.350 - 4.500 uIU/mL   Comment: Performed at [facility]  GLUCOSE, CAPILLARY     Status: Abnormal   Collection Time    01/02/13  4:39 AM      Result Value Range   Glucose-Capillary 332 (*) 70 - 99 mg/dL   Comment 1 Notify RN    GLUCOSE, CAPILLARY     Status: Abnormal   Collection Time    01/02/13  8:31 AM      Result Value Range   Glucose-Capillary 282 (*) 70 - 99 mg/dL  TROPONIN I     Status: Abnormal   Collection Time    01/02/13  9:34 AM      Result Value Range   Troponin I 3.06 (*) <0.30 ng/mL   Comment:            Due to the release kinetics of cTnI,     a negative result within the first hours       of the onset of symptoms does not rule out     myocardial infarction with certainty.     If myocardial infarction is still suspected,     repeat the test at appropriate intervals.     REPEATED TO VERIFY     CRITICAL RESULT CALLED TO, READ BACK BY AND VERIFIED WITH:     MACASERO M.,RN 01/02/13 1039 BY JONESJ  HEPARIN LEVEL (UNFRACTIONATED)     Status: None   Collection Time    01/02/13  9:34 AM      Result Value Range   Heparin Unfractionated 0.44  0.30 - 0.70 IU/mL   Comment:            IF HEPARIN RESULTS ARE BELOW     EXPECTED VALUES, AND PATIENT     DOSAGE HAS BEEN CONFIRMED,     SUGGEST FOLLOW UP TESTING     OF ANTITHROMBIN III LEVELS.  GLUCOSE, CAPILLARY     Status: Abnormal   Collection Time    01/02/13 11:08 AM      Result Value Range   Glucose-Capillary 223 (*) 70 - 99 mg/dL  TROPONIN I     Status: Abnormal   Collection Time    01/02/13  1:51 PM      Result Value Range   Troponin I 2.97 (*) <0.30 ng/mL   Comment:            Due to the release kinetics of cTnI,     a negative result within the first hours     of the onset of symptoms does not rule out     myocardial infarction with certainty.     If myocardial infarction is still suspected,     repeat the test at appropriate intervals.     REPEATED TO VERIFY     CRITICAL VALUE NOTED.  VALUE IS CONSISTENT WITH PREVIOUSLY REPORTED AND CALLED VALUE.  HEPARIN LEVEL (UNFRACTIONATED)       Status: None   Collection Time    01/02/13  2:00 PM      Result Value Range   Heparin Unfractionated 0.35  0.30 - 0.70 IU/mL   Comment:            IF HEPARIN RESULTS ARE BELOW     EXPECTED VALUES, AND PATIENT     DOSAGE HAS BEEN CONFIRMED,     SUGGEST FOLLOW UP TESTING     OF ANTITHROMBIN III LEVELS.  GLUCOSE, CAPILLARY     Status: Abnormal   Collection Time    01/02/13  3:46 PM      Result Value Range   Glucose-Capillary 258 (*) 70 - 99 mg/dL  TROPONIN I     Status: Abnormal   Collection Time    01/02/13  6:42 PM       Result Value Range   Troponin I 6.10 (*) <0.30 ng/mL   Comment:            Due to the release kinetics of cTnI,     a negative result within the first hours     of the onset of symptoms does not rule out     myocardial infarction with certainty.     If myocardial infarction is still suspected,     repeat the test at appropriate intervals.     REPEATED TO VERIFY     CRITICAL VALUE NOTED.  VALUE IS CONSISTENT WITH PREVIOUSLY REPORTED AND CALLED VALUE.  GLUCOSE, CAPILLARY     Status: Abnormal   Collection Time    01/02/13  7:56 PM      Result Value Range   Glucose-Capillary 325 (*) 70 - 99 mg/dL   Comment 1 Notify RN    GLUCOSE, CAPILLARY     Status: Abnormal   Collection Time    01/03/13 12:21 AM      Result Value Range   Glucose-Capillary 197 (*) 70 - 99 mg/dL   Comment 1 Notify RN    BASIC METABOLIC PANEL     Status: Abnormal   Collection Time    01/03/13  3:55 AM      Result Value Range   Sodium 137  135 - 145 mEq/L   Potassium 4.4  3.5 - 5.1 mEq/L   Chloride 101  96 - 112 mEq/L   CO2 23  19 - 32 mEq/L   Glucose, Bld 265 (*) 70 - 99 mg/dL   BUN 23  6 - 23 mg/dL   Creatinine, Ser 1.24  0.50 - 1.35 mg/dL   Calcium 9.1  8.4 - 10.5 mg/dL   GFR calc non Af Amer 54 (*) >90 mL/min   GFR calc Af Amer 62 (*) >90 mL/min   Comment: (NOTE)     The eGFR has been calculated using the CKD EPI equation.     This calculation has not been validated in all clinical situations.     eGFR's persistently <90 mL/min signify possible Chronic Kidney     Disease.  HEPARIN LEVEL (UNFRACTIONATED)     Status: Abnormal   Collection Time    01/03/13  3:55 AM      Result Value Range   Heparin Unfractionated 0.27 (*) 0.30 - 0.70 IU/mL   Comment:            IF HEPARIN RESULTS ARE BELOW     EXPECTED VALUES, AND PATIENT     DOSAGE HAS BEEN CONFIRMED,     SUGGEST FOLLOW UP TESTING       OF ANTITHROMBIN III LEVELS.  CBC     Status: Abnormal   Collection Time    01/03/13  3:55 AM      Result Value  Range   WBC 18.9 (*) 4.0 - 10.5 K/uL   RBC 4.35  4.22 - 5.81 MIL/uL   Hemoglobin 13.1  13.0 - 17.0 g/dL   HCT 39.6  39.0 - 52.0 %   MCV 91.0  78.0 - 100.0 fL   MCH 30.1  26.0 - 34.0 pg   MCHC 33.1  30.0 - 36.0 g/dL   RDW 13.5  11.5 - 15.5 %   Platelets 225  150 - 400 K/uL  GLUCOSE, CAPILLARY     Status: Abnormal   Collection Time    01/03/13  4:22 AM      Result Value Range   Glucose-Capillary 264 (*) 70 - 99 mg/dL   Comment 1 Notify RN      Imaging: Imaging results have been reviewed  Assessment/Plan:   1. Principal Problem: 2.   Unstable angina with acute ekg changes 3. Active Problems: 4.   DIABETES MELLITUS, TYPE II 5.   HYPERTENSION 6.   CORONARY ARTERY DISEASE 7.   PERIPHERAL VASCULAR DISEASE WITH CLAUDICATION - chronic total occlusion of mid left SFA, 90% calcified mid right SFA 8.   PROSTATE CANCER, HX OF 9.   Hypoxia 10.   Acute diastolic CHF (congestive heart failure) 11.   Time Spent Directly with Patient:  20 minutes  Length of Stay:  LOS: 2 days   On IV hep/NTG. + Trop / NSTEMI prob related to LCX-OM stent. Dynamic lateral TWI. Afeb but WBC 18K. Other labs OK. Exam benign. For cath via right radial approach later today by myself.    [place]phis Decoteau J 01/03/2013, 8:54 AM 

## 2013-01-03 NOTE — Progress Notes (Addendum)
Pre-op Cardiac Surgery  Carotid Findings:  Findings suggest 1-39% internal carotid artery stenosis bilaterally. Vertebral arteries are patent with antegrade flow.   01/03/2013 6:28 PM [name] [name], RVT, RDCS, RDMS     Upper Extremity Right Left  Brachial Pressures 117 Triphasic  118 Triphasic   Radial Waveforms Triphasic  Triphasic   Ulnar Waveforms Triphasic  Triphasic   Palmar Arch (Allen's Test) Within normal limits  Within normal limits      Lower  Extremity Right Left  Dorsalis Pedis 65 Dampened monophasic  50 Monophasic   Anterior Tibial    Posterior Tibial 78  Dampened monophasic  84  Monophasic   Ankle/Brachial Indices 0.66 0.71    [name] [name], RVT 01/04/2013 11:19 AM

## 2013-01-03 NOTE — Progress Notes (Signed)
ANTICOAGULATION CONSULT NOTE - Follow Up  Pharmacy Consult:  Heparin Indication: chest pain/ACS  No Known Allergies  Patient Measurements: Height: 6' 0.83" (185 cm) Weight: 251 lb 5.2 oz (114 kg) IBW/kg (Calculated) : 79.52 Heparin Dosing Weight: 105 kg   Vital Signs: Temp: 98.1 F (36.7 C) (12/01 0440) Temp src: Oral (12/01 0440) BP: 126/79 mmHg (12/01 0600) Pulse Rate: 78 (12/01 0600)  Labs:  Recent Labs  01/01/13 2035 01/02/13 0934 01/02/13 1351 01/02/13 1400 01/02/13 1842 01/03/13 0355  HGB 14.2  --   --   --   --  13.1  HCT 41.3  --   --   --   --  39.6  PLT 221  --   --   --   --  225  HEPARINUNFRC  --  0.44  --  0.35  --  0.27*  CREATININE 1.05  --   --   --   --  1.24  TROPONINI  --  3.06* 2.97*  --  6.10*  --    Estimated Creatinine Clearance: 63.7 ml/min (by C-G formula based on Cr of 1.24).     Assessment: 81 YOM on IV heparin for NSTEMI.  Heparin level slightly sub-therapeutic this AM and has been trending down.  No bleeding reported.  Noted plan for cath.   Goal of Therapy:  Heparin level 0.3-0.7 units/ml Monitor platelets by anticoagulation protocol: Yes    Plan:  - Increase heparin gtt to 1500 units/hr, no bolus d/t plan for cath - Check 8 hr HL vs cath - Daily HL / CBC - F/U CBGs post steroid discontinuation, resume home meds when appropriate    Sanders Manninen D. Laney Potash, PharmD, BCPS Pager:  [phone] 01/03/2013, 7:28 AM
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
## 2013-01-03 NOTE — Interval H&P Note (Signed)
Cath Lab Visit (complete for each Cath Lab visit)  Clinical Evaluation Leading to the Procedure:   ACS: yes  Non-ACS:    Anginal Classification: CCS IV  Anti-ischemic medical therapy: Maximal Therapy (2 or more classes of medications)  Non-Invasive Test Results: No non-invasive testing performed  Prior CABG: No previous CABG      History and Physical Interval Note:  01/03/2013 1:27 PM  [name] [name]  has presented today for surgery, with the diagnosis of NON STEMI  The various methods of treatment have been discussed with the patient and family. After consideration of risks, benefits and other options for treatment, the patient has consented to  Procedure(s): LEFT HEART CATHETERIZATION WITH CORONARY ANGIOGRAM (N/A) as a surgical intervention .  The patient's history has been reviewed, patient examined, no change in status, stable for surgery.  I have reviewed the patient's chart and labs.  Questions were answered to the patient's satisfaction.     [name] [name]

## 2013-01-04 ENCOUNTER — Encounter (HOSPITAL_COMMUNITY): Payer: Self-pay | Admitting: *Deleted

## 2013-01-04 ENCOUNTER — Inpatient Hospital Stay (HOSPITAL_COMMUNITY): Payer: Medicare Other

## 2013-01-04 DIAGNOSIS — I214 Non-ST elevation (NSTEMI) myocardial infarction: Secondary | ICD-10-CM

## 2013-01-04 DIAGNOSIS — Z01810 Encounter for preprocedural cardiovascular examination: Secondary | ICD-10-CM

## 2013-01-04 LAB — GLUCOSE, CAPILLARY
Glucose-Capillary: 240 mg/dL — ABNORMAL HIGH (ref 70–99)
Glucose-Capillary: 162 mg/dL — ABNORMAL HIGH (ref 70–99)
Glucose-Capillary: 197 mg/dL — ABNORMAL HIGH (ref 70–99)
Glucose-Capillary: 217 mg/dL — ABNORMAL HIGH (ref 70–99)
Glucose-Capillary: 167 mg/dL — ABNORMAL HIGH (ref 70–99)

## 2013-01-04 LAB — URINALYSIS, ROUTINE W REFLEX MICROSCOPIC
Bilirubin Urine: NEGATIVE
Glucose, UA: NEGATIVE mg/dL
Hgb urine dipstick: NEGATIVE
Ketones, ur: NEGATIVE mg/dL
Nitrite: POSITIVE — AB
Specific Gravity, Urine: 1.027 (ref 1.005–1.030)
Urobilinogen, UA: 0.2 mg/dL (ref 0.0–1.0)
pH: 5 (ref 5.0–8.0)

## 2013-01-04 LAB — PULMONARY FUNCTION TEST
DLCO unc % pred: 51 %
DLCO unc: 17.44 ml/min/mmHg
FEF2575-%Change-Post: 29 %
FEV1-%Change-Post: 24 %
FEV1-%Pred-Post: 83 %
FEV1-Pre: 1.88 L
FEV1FVC-%Change-Post: 31 %
FEV6-%Pred-Pre: 80 %
FEV6-Post: 3.01 L
FEV6-Pre: 2.89 L
FEV6FVC-%Change-Post: 0 %
FEV6FVC-%Pred-Post: 104 %
FEV6FVC-%Pred-Pre: 103 %
FVC-%Change-Post: -5 %
FVC-%Pred-Pre: 83 %
FVC-Post: 3.04 L
Post FEV6/FVC ratio: 99 %
Pre FEV1/FVC ratio: 59 %
Pre FEV6/FVC Ratio: 99 %
RV % pred: 70 %
RV: 1.89 L
TLC % pred: 65 %
TLC: 4.78 L

## 2013-01-04 LAB — APTT: aPTT: 30 seconds (ref 24–37)

## 2013-01-04 LAB — BASIC METABOLIC PANEL
Calcium: 9 mg/dL (ref 8.4–10.5)
Chloride: 101 mEq/L (ref 96–112)
Creatinine, Ser: 1.37 mg/dL — ABNORMAL HIGH (ref 0.50–1.35)
GFR calc Af Amer: 55 mL/min — ABNORMAL LOW (ref >90)
GFR calc non Af Amer: 47 mL/min — ABNORMAL LOW (ref >90)
Sodium: 137 mEq/L (ref 135–145)

## 2013-01-04 LAB — COMPREHENSIVE METABOLIC PANEL
ALT: 17 U/L (ref 0–53)
AST: 30 U/L (ref 0–37)
Alkaline Phosphatase: 68 U/L (ref 39–117)
Calcium: 8.9 mg/dL (ref 8.4–10.5)
Chloride: 101 mEq/L (ref 96–112)
GFR calc Af Amer: 55 mL/min — ABNORMAL LOW (ref >90)
Potassium: 4.1 mEq/L (ref 3.5–5.1)
Sodium: 137 mEq/L (ref 135–145)
Total Protein: 7 g/dL (ref 6.0–8.3)

## 2013-01-04 LAB — TYPE AND SCREEN
ABO/RH(D): O POS
Antibody Screen: NEGATIVE

## 2013-01-04 LAB — CBC
Platelets: 215 10*3/uL (ref 150–400)
RBC: 4.64 MIL/uL (ref 4.22–5.81)
RDW: 13.8 % (ref 11.5–15.5)
WBC: 14.5 10*3/uL — ABNORMAL HIGH (ref 4.0–10.5)

## 2013-01-04 LAB — URINE MICROSCOPIC-ADD ON

## 2013-01-04 LAB — PROTIME-INR
INR: 1.08 (ref 0.00–1.49)
Prothrombin Time: 13.8 seconds (ref 11.6–15.2)

## 2013-01-04 MED ORDER — EPINEPHRINE HCL 1 MG/ML IJ SOLN
0.5000 ug/min | INTRAVENOUS | Status: DC
  Filled 2013-01-04: qty 4

## 2013-01-04 MED ORDER — MAGNESIUM SULFATE 50 % IJ SOLN
40.0000 meq | INTRAMUSCULAR | Status: DC
  Filled 2013-01-04: qty 10

## 2013-01-04 MED ORDER — SODIUM CHLORIDE 0.9 % IV SOLN
INTRAVENOUS | Status: DC
  Filled 2013-01-04: qty 1

## 2013-01-04 MED ORDER — ~~LOC~~ CARDIAC SURGERY, PATIENT & FAMILY EDUCATION
Freq: Once | Status: AC
  Administered 2013-01-04: 06:00:00
  Filled 2013-01-04: qty 1

## 2013-01-04 MED ORDER — LIDOCAINE HCL (PF) 1 % IJ SOLN
INTRAMUSCULAR | Status: AC
  Filled 2013-01-04: qty 30

## 2013-01-04 MED ORDER — DEXMEDETOMIDINE HCL IN NACL 400 MCG/100ML IV SOLN
0.1000 ug/kg/h | INTRAVENOUS | Status: DC
  Filled 2013-01-04: qty 100

## 2013-01-04 MED ORDER — POTASSIUM CHLORIDE 2 MEQ/ML IV SOLN
80.0000 meq | INTRAVENOUS | Status: DC
  Filled 2013-01-04: qty 40

## 2013-01-04 MED ORDER — SODIUM CHLORIDE 0.9 % IV SOLN
INTRAVENOUS | Status: DC
  Filled 2013-01-04: qty 30

## 2013-01-04 MED ORDER — INSULIN ASPART 100 UNIT/ML ~~LOC~~ SOLN: 0.0000 [IU] | Freq: Every day | SUBCUTANEOUS | Status: DC

## 2013-01-04 MED ORDER — MIDAZOLAM HCL 2 MG/2ML IJ SOLN
INTRAMUSCULAR | Status: AC
  Filled 2013-01-04: qty 2

## 2013-01-04 MED ORDER — INSULIN ASPART 100 UNIT/ML ~~LOC~~ SOLN
0.0000 [IU] | Freq: Three times a day (TID) | SUBCUTANEOUS | Status: DC
  Administered 2013-01-04 (×2): 2 [IU] via SUBCUTANEOUS
  Administered 2013-01-04: 13:00:00 3 [IU] via SUBCUTANEOUS

## 2013-01-04 MED ORDER — VANCOMYCIN HCL 10 G IV SOLR
1250.0000 mg | INTRAVENOUS | Status: AC
  Administered 2013-01-05: 1250 mg via INTRAVENOUS
  Filled 2013-01-04: qty 1250

## 2013-01-04 MED ORDER — FENTANYL CITRATE 0.05 MG/ML IJ SOLN
INTRAMUSCULAR | Status: AC
  Filled 2013-01-04: qty 2

## 2013-01-04 MED ORDER — NITROGLYCERIN IN D5W 200-5 MCG/ML-% IV SOLN
2.0000 ug/min | INTRAVENOUS | Status: DC
  Filled 2013-01-04: qty 250

## 2013-01-04 MED ORDER — DEXTROSE 5 % IV SOLN
750.0000 mg | INTRAVENOUS | Status: DC
  Filled 2013-01-04 (×2): qty 750

## 2013-01-04 MED ORDER — SODIUM CHLORIDE 0.9 % IV SOLN
INTRAVENOUS | Status: DC
  Filled 2013-01-04: qty 40

## 2013-01-04 MED ORDER — HEPARIN (PORCINE) IN NACL 2-0.9 UNIT/ML-% IJ SOLN
INTRAMUSCULAR | Status: AC
  Filled 2013-01-04: qty 1000

## 2013-01-04 MED ORDER — ALBUTEROL SULFATE (5 MG/ML) 0.5% IN NEBU
2.5000 mg | INHALATION_SOLUTION | Freq: Once | RESPIRATORY_TRACT | Status: AC
  Administered 2013-01-04: 2.5 mg via RESPIRATORY_TRACT

## 2013-01-04 MED ORDER — DEXTROSE 5 % IV SOLN
1.5000 g | INTRAVENOUS | Status: AC
  Administered 2013-01-05: .75 g via INTRAVENOUS
  Administered 2013-01-05: 1.5 g via INTRAVENOUS
  Filled 2013-01-04 (×2): qty 1.5

## 2013-01-04 MED ORDER — NITROGLYCERIN 0.2 MG/ML ON CALL CATH LAB
INTRAVENOUS | Status: AC
  Filled 2013-01-04: qty 1

## 2013-01-04 MED ORDER — PLASMA-LYTE 148 IV SOLN
INTRAVENOUS | Status: AC
  Administered 2013-01-05: 08:00:00
  Filled 2013-01-04: qty 2.5

## 2013-01-04 MED ORDER — HEART ATTACK BOUNCING BOOK
Freq: Once | Status: AC
  Administered 2013-01-04: 06:00:00
  Filled 2013-01-04: qty 1

## 2013-01-04 MED ORDER — DOPAMINE-DEXTROSE 3.2-5 MG/ML-% IV SOLN
2.0000 ug/kg/min | INTRAVENOUS | Status: DC
  Filled 2013-01-04: qty 250

## 2013-01-04 MED ORDER — PHENYLEPHRINE HCL 10 MG/ML IJ SOLN
30.0000 ug/min | INTRAVENOUS | Status: DC
  Filled 2013-01-04: qty 2

## 2013-01-04 NOTE — Progress Notes (Signed)
CARDIAC REHAB PHASE I   PRE:  Rate/Rhythm: 68 SR  BP:  Supine: 140/89  Sitting:   Standing:    SaO2: 96 RA  MODE:  Ambulation: 1000 ft   POST:  Rate/Rhythm: 90 SR  BP:  Supine: 154/83  Sitting:   Standing:    SaO2: 96 RA 1420-1510  Pt tolerated ambulation well without c/o of cp or SOB. Pt states that his leg pain  Is much better since Dr Allyson Sabal started him on medication for the ain about 10 days ago. He was able to walk 1000 feet without any c/o of leg pain. Completed pre-op education with pt and his family.We discussed walking, use of IS, and 24/7 care at discharge. They voice understanding. We discussed sternal precautions and proper technique of getting in and out of bed and chair.  Melina Copa RN 01/04/2013 3:08 PM

## 2013-01-04 NOTE — Progress Notes (Signed)
1 Day Post-Op Procedure(s) (LRB): LEFT HEART CATHETERIZATION WITH CORONARY ANGIOGRAM (N/A) Subjective: No chest pain or shortness of breath today  Objective: Vital signs in last 24 hours: Temp:  [97.9 F (36.6 C)-98.6 F (37 C)] 98.3 F (36.8 C) (12/02 1612) Pulse Rate:  [67-87] 69 (12/02 1612) Cardiac Rhythm:  [-] Normal sinus rhythm (12/02 0751) Resp:  [16-20] 18 (12/02 1612) BP: (119-159)/(40-86) 134/79 mmHg (12/02 1612) SpO2:  [92 %-94 %] 94 % (12/02 1612) Weight:  [247 lb 12.8 oz (112.4 kg)] 247 lb 12.8 oz (112.4 kg) (12/02 0007)  Hemodynamic parameters for last 24 hours:    Intake/Output from previous day: 12/01 0701 - 12/02 0700 In: 1040.3 [P.O.:390; I.V.:650.3] Out: 2450 [Urine:2450] Intake/Output this shift: Total I/O In: 840 [P.O.:840] Out: 600 [Urine:600]  General appearance: alert and no distress Neurologic: intact Heart: regular rate and rhythm Lungs: clear to auscultation bilaterally  Lab Results:  Recent Labs  01/03/13 0355 01/04/13 0500  WBC 18.9* 14.5*  HGB 13.1 14.2  HCT 39.6 42.6  PLT 225 215   BMET:  Recent Labs  01/04/13 0500 01/04/13 0815  NA 137 137  K 3.9 4.1  CL 101 101  CO2 27 25  GLUCOSE 165* 188*  BUN 31* 30*  CREATININE 1.37* 1.36*  CALCIUM 9.0 8.9    PT/INR:  Recent Labs  01/04/13 0815  LABPROT 13.8  INR 1.08   ABG    Component Value Date/Time   PHART 7.451* 01/02/2013 0022   HCO3 24.0 01/02/2013 0022   TCO2 25 01/02/2013 0022   O2SAT 98.0 01/02/2013 0022   CBG (last 3)   Recent Labs  01/04/13 0601 01/04/13 0816 01/04/13 1236  GLUCAP 162* 197* 217*    Assessment/Plan: S/P Procedure(s) (LRB): LEFT HEART CATHETERIZATION WITH CORONARY ANGIOGRAM (N/A) 3 vessel CAD for CABG in Am Carotid duplex showed 1-39% bilaterally Normal Allen's bilaterally ABI 0.66 on R and 0.71 on left  All questions answered    LOS: 3 days    Yannick Steuber C 01/04/2013

## 2013-01-04 NOTE — Progress Notes (Signed)
Subjective:  No CP/SOB  Objective:  Temp:  [97.9 F (36.6 C)-98.6 F (37 C)] 98.2 F (36.8 C) (12/02 0809) Pulse Rate:  [47-102] 71 (12/02 0809) Resp:  [12-32] 20 (12/02 0809) BP: (119-161)/(40-110) 126/78 mmHg (12/02 0809) SpO2:  [92 %-99 %] 94 % (12/02 0809) Weight:  [247 lb 12.8 oz (112.4 kg)] 247 lb 12.8 oz (112.4 kg) (12/02 0007) Weight change: -3 lb 8.4 oz (-1.6 kg)  Intake/Output from previous day: 12/01 0701 - 12/02 0700 In: 1040.3 [P.O.:390; I.V.:650.3] Out: 2450 [Urine:2450]  Intake/Output from this shift: Total I/O In: 480 [P.O.:480] Out: 300 [Urine:300]  Physical Exam: General appearance: alert and no distress Neck: no adenopathy, no carotid bruit, no JVD, supple, symmetrical, trachea midline and thyroid not enlarged, symmetric, no tenderness/mass/nodules Lungs: clear to auscultation bilaterally Heart: regular rate and rhythm, S1, S2 normal, no murmur, click, rub or gallop Extremities: extremities normal, atraumatic, no cyanosis or edema and Right radial puncture site OK  Lab Results: Results for orders placed during the hospital encounter of 01/01/13 (from the past 48 hour(s))  TROPONIN I     Status: Abnormal   Collection Time    01/02/13  9:34 AM      Result Value Range   Troponin I 3.06 (*) <0.30 ng/mL   Comment:            Due to the release kinetics of cTnI,     a negative result within the first hours     of the onset of symptoms does not rule out     myocardial infarction with certainty.     If myocardial infarction is still suspected,     repeat the test at appropriate intervals.     REPEATED TO VERIFY     CRITICAL RESULT CALLED TO, READ BACK BY AND VERIFIED WITH:     MACASERO M.,RN 01/02/13 1039 BY JONESJ  HEPARIN LEVEL (UNFRACTIONATED)     Status: None   Collection Time    01/02/13  9:34 AM      Result Value Range   Heparin Unfractionated 0.44  0.30 - 0.70 IU/mL   Comment:            IF HEPARIN RESULTS ARE BELOW     EXPECTED  VALUES, AND PATIENT     DOSAGE HAS BEEN CONFIRMED,     SUGGEST FOLLOW UP TESTING     OF ANTITHROMBIN III LEVELS.  GLUCOSE, CAPILLARY     Status: Abnormal   Collection Time    01/02/13 11:08 AM      Result Value Range   Glucose-Capillary 223 (*) 70 - 99 mg/dL  TROPONIN I     Status: Abnormal   Collection Time    01/02/13  1:51 PM      Result Value Range   Troponin I 2.97 (*) <0.30 ng/mL   Comment:            Due to the release kinetics of cTnI,     a negative result within the first hours     of the onset of symptoms does not rule out     myocardial infarction with certainty.     If myocardial infarction is still suspected,     repeat the test at appropriate intervals.     REPEATED TO VERIFY     CRITICAL VALUE NOTED.  VALUE IS CONSISTENT WITH PREVIOUSLY REPORTED AND CALLED VALUE.  HEPARIN LEVEL (UNFRACTIONATED)     Status: None   Collection Time  01/02/13  2:00 PM      Result Value Range   Heparin Unfractionated 0.35  0.30 - 0.70 IU/mL   Comment:            IF HEPARIN RESULTS ARE BELOW     EXPECTED VALUES, AND PATIENT     DOSAGE HAS BEEN CONFIRMED,     SUGGEST FOLLOW UP TESTING     OF ANTITHROMBIN III LEVELS.  GLUCOSE, CAPILLARY     Status: Abnormal   Collection Time    01/02/13  3:46 PM      Result Value Range   Glucose-Capillary 258 (*) 70 - 99 mg/dL  TROPONIN I     Status: Abnormal   Collection Time    01/02/13  6:42 PM      Result Value Range   Troponin I 6.10 (*) <0.30 ng/mL   Comment:            Due to the release kinetics of cTnI,     a negative result within the first hours     of the onset of symptoms does not rule out     myocardial infarction with certainty.     If myocardial infarction is still suspected,     repeat the test at appropriate intervals.     REPEATED TO VERIFY     CRITICAL VALUE NOTED.  VALUE IS CONSISTENT WITH PREVIOUSLY REPORTED AND CALLED VALUE.  GLUCOSE, CAPILLARY     Status: Abnormal   Collection Time    01/02/13  7:56 PM       Result Value Range   Glucose-Capillary 325 (*) 70 - 99 mg/dL   Comment 1 Notify RN    GLUCOSE, CAPILLARY     Status: Abnormal   Collection Time    01/03/13 12:21 AM      Result Value Range   Glucose-Capillary 197 (*) 70 - 99 mg/dL   Comment 1 Notify RN    BASIC METABOLIC PANEL     Status: Abnormal   Collection Time    01/03/13  3:55 AM      Result Value Range   Sodium 137  135 - 145 mEq/L   Potassium 4.4  3.5 - 5.1 mEq/L   Chloride 101  96 - 112 mEq/L   CO2 23  19 - 32 mEq/L   Glucose, Bld 265 (*) 70 - 99 mg/dL   BUN 23  6 - 23 mg/dL   Creatinine, Ser 1.61  0.50 - 1.35 mg/dL   Calcium 9.1  8.4 - 09.6 mg/dL   GFR calc non Af Amer 54 (*) >90 mL/min   GFR calc Af Amer 62 (*) >90 mL/min   Comment: (NOTE)     The eGFR has been calculated using the CKD EPI equation.     This calculation has not been validated in all clinical situations.     eGFR's persistently <90 mL/min signify possible Chronic Kidney     Disease.  HEPARIN LEVEL (UNFRACTIONATED)     Status: Abnormal   Collection Time    01/03/13  3:55 AM      Result Value Range   Heparin Unfractionated 0.27 (*) 0.30 - 0.70 IU/mL   Comment:            IF HEPARIN RESULTS ARE BELOW     EXPECTED VALUES, AND PATIENT     DOSAGE HAS BEEN CONFIRMED,     SUGGEST FOLLOW UP TESTING     OF ANTITHROMBIN III LEVELS.  CBC  Status: Abnormal   Collection Time    01/03/13  3:55 AM      Result Value Range   WBC 18.9 (*) 4.0 - 10.5 K/uL   RBC 4.35  4.22 - 5.81 MIL/uL   Hemoglobin 13.1  13.0 - 17.0 g/dL   HCT 40.9  81.1 - 91.4 %   MCV 91.0  78.0 - 100.0 fL   MCH 30.1  26.0 - 34.0 pg   MCHC 33.1  30.0 - 36.0 g/dL   RDW 78.2  95.6 - 21.3 %   Platelets 225  150 - 400 K/uL  GLUCOSE, CAPILLARY     Status: Abnormal   Collection Time    01/03/13  4:22 AM      Result Value Range   Glucose-Capillary 264 (*) 70 - 99 mg/dL   Comment 1 Notify RN    GLUCOSE, CAPILLARY     Status: Abnormal   Collection Time    01/03/13  7:41 AM      Result  Value Range   Glucose-Capillary 244 (*) 70 - 99 mg/dL  GLUCOSE, CAPILLARY     Status: Abnormal   Collection Time    01/03/13 11:49 AM      Result Value Range   Glucose-Capillary 168 (*) 70 - 99 mg/dL  GLUCOSE, CAPILLARY     Status: Abnormal   Collection Time    01/03/13  2:35 PM      Result Value Range   Glucose-Capillary 191 (*) 70 - 99 mg/dL  GLUCOSE, CAPILLARY     Status: Abnormal   Collection Time    01/03/13  6:13 PM      Result Value Range   Glucose-Capillary 197 (*) 70 - 99 mg/dL  GLUCOSE, CAPILLARY     Status: Abnormal   Collection Time    01/03/13  7:56 PM      Result Value Range   Glucose-Capillary 234 (*) 70 - 99 mg/dL   Comment 1 Documented in Chart     Comment 2 JULIE RN    GLUCOSE, CAPILLARY     Status: Abnormal   Collection Time    01/04/13 12:05 AM      Result Value Range   Glucose-Capillary 240 (*) 70 - 99 mg/dL   Comment 1 Documented in Chart     Comment 2 JULIE  RN    BASIC METABOLIC PANEL     Status: Abnormal   Collection Time    01/04/13  5:00 AM      Result Value Range   Sodium 137  135 - 145 mEq/L   Potassium 3.9  3.5 - 5.1 mEq/L   Chloride 101  96 - 112 mEq/L   CO2 27  19 - 32 mEq/L   Glucose, Bld 165 (*) 70 - 99 mg/dL   BUN 31 (*) 6 - 23 mg/dL   Creatinine, Ser 0.86 (*) 0.50 - 1.35 mg/dL   Calcium 9.0  8.4 - 57.8 mg/dL   GFR calc non Af Amer 47 (*) >90 mL/min   GFR calc Af Amer 55 (*) >90 mL/min   Comment: (NOTE)     The eGFR has been calculated using the CKD EPI equation.     This calculation has not been validated in all clinical situations.     eGFR's persistently <90 mL/min signify possible Chronic Kidney     Disease.  CBC     Status: Abnormal   Collection Time    01/04/13  5:00 AM  Result Value Range   WBC 14.5 (*) 4.0 - 10.5 K/uL   RBC 4.64  4.22 - 5.81 MIL/uL   Hemoglobin 14.2  13.0 - 17.0 g/dL   HCT 16.1  09.6 - 04.5 %   MCV 91.8  78.0 - 100.0 fL   MCH 30.6  26.0 - 34.0 pg   MCHC 33.3  30.0 - 36.0 g/dL   RDW 40.9  81.1  - 91.4 %   Platelets 215  150 - 400 K/uL  TYPE AND SCREEN     Status: None   Collection Time    01/04/13  5:30 AM      Result Value Range   ABO/RH(D) O POS     Antibody Screen NEG     Sample Expiration 01/07/2013    ABO/RH     Status: None   Collection Time    01/04/13  5:30 AM      Result Value Range   ABO/RH(D) O POS    GLUCOSE, CAPILLARY     Status: Abnormal   Collection Time    01/04/13  6:01 AM      Result Value Range   Glucose-Capillary 162 (*) 70 - 99 mg/dL   Comment 1 Documented in Chart     Comment 2 JULIE RN    GLUCOSE, CAPILLARY     Status: Abnormal   Collection Time    01/04/13  8:16 AM      Result Value Range   Glucose-Capillary 197 (*) 70 - 99 mg/dL   Comment 1 Notify RN      Imaging: Imaging results have been reviewed  Assessment/Plan:   1. Principal Problem: 2.   NSTEMI (non-ST elevated myocardial infarction) 3. Active Problems: 4.   DIABETES MELLITUS, TYPE II 5.   HYPERTENSION 6.   CORONARY ARTERY DISEASE 7.   PERIPHERAL VASCULAR DISEASE WITH CLAUDICATION - chronic total occlusion of mid left SFA, 90% calcified mid right SFA 8.   PROSTATE CANCER, HX OF 9.   Hypoxia 10.   Acute diastolic CHF (congestive heart failure) 11.   Unstable angina with acute ekg changes 12.   Time Spent Directly with Patient:  20 minutes  Length of Stay:  LOS: 3 days   S/P Cath via Right radial approach. 3 VD. Seen by Dr. Dorris Fetch yesterday. For CABG tomorrow, Exam benign. Labs OK.   [name] [name] 01/04/2013, 8:52 AM

## 2013-01-05 ENCOUNTER — Encounter (HOSPITAL_COMMUNITY)
Admission: EM | Disposition: A | Discharge status: Home / Self Care | Payer: Medicare Other | Source: Home / Self Care | Attending: Thoracic Surgery (Cardiothoracic Vascular Surgery)

## 2013-01-05 ENCOUNTER — Inpatient Hospital Stay (HOSPITAL_COMMUNITY): Payer: Medicare Other

## 2013-01-05 ENCOUNTER — Encounter (HOSPITAL_COMMUNITY): Payer: Medicare Other | Admitting: Anesthesiology

## 2013-01-05 ENCOUNTER — Inpatient Hospital Stay (HOSPITAL_COMMUNITY): Payer: Medicare Other | Admitting: Anesthesiology

## 2013-01-05 DIAGNOSIS — I2510 Atherosclerotic heart disease of native coronary artery without angina pectoris: Secondary | ICD-10-CM

## 2013-01-05 HISTORY — PX: INTRAOPERATIVE TRANSESOPHAGEAL ECHOCARDIOGRAM: SHX5062

## 2013-01-05 HISTORY — PX: CORONARY ARTERY BYPASS GRAFT: SHX141

## 2013-01-05 LAB — POCT I-STAT, CHEM 8
BUN: 19 mg/dL (ref 6–23)
Chloride: 107 mEq/L (ref 96–112)
Creatinine, Ser: 1.3 mg/dL (ref 0.50–1.35)
Hemoglobin: 10.2 g/dL — ABNORMAL LOW (ref 13.0–17.0)
Potassium: 4.4 mEq/L (ref 3.5–5.1)
Sodium: 139 mEq/L (ref 135–145)

## 2013-01-05 LAB — CBC
HCT: 30 % — ABNORMAL LOW (ref 39.0–52.0)
Hemoglobin: 11.2 g/dL — ABNORMAL LOW (ref 13.0–17.0)
MCH: 31.1 pg (ref 26.0–34.0)
MCH: 31 pg (ref 26.0–34.0)
MCHC: 33.6 g/dL (ref 30.0–36.0)
MCHC: 33.5 g/dL (ref 30.0–36.0)
MCHC: 34 g/dL (ref 30.0–36.0)
MCV: 91.7 fL (ref 78.0–100.0)
Platelets: 223 10*3/uL (ref 150–400)
Platelets: 164 10*3/uL (ref 150–400)
RBC: 4.56 MIL/uL (ref 4.22–5.81)
RBC: 3.61 MIL/uL — ABNORMAL LOW (ref 4.22–5.81)
RBC: 3.27 MIL/uL — ABNORMAL LOW (ref 4.22–5.81)
RDW: 13.4 % (ref 11.5–15.5)
RDW: 13.5 % (ref 11.5–15.5)
WBC: 15.4 10*3/uL — ABNORMAL HIGH (ref 4.0–10.5)

## 2013-01-05 LAB — POCT I-STAT 3, ART BLOOD GAS (G3+)
Acid-base deficit: 3 mmol/L — ABNORMAL HIGH (ref 0.0–2.0)
Acid-base deficit: 3 mmol/L — ABNORMAL HIGH (ref 0.0–2.0)
Bicarbonate: 22 mEq/L (ref 20.0–24.0)
O2 Saturation: 89 %
O2 Saturation: 96 %
O2 Saturation: 96 %
O2 Saturation: 96 %
Patient temperature: 37.9
TCO2: 24 mmol/L (ref 0–100)
pCO2 arterial: 42.8 mmHg (ref 35.0–45.0)
pCO2 arterial: 33.3 mmHg — ABNORMAL LOW (ref 35.0–45.0)
pCO2 arterial: 36.8 mmHg (ref 35.0–45.0)
pCO2 arterial: 35.5 mmHg (ref 35.0–45.0)
pH, Arterial: 7.386 (ref 7.350–7.450)
pO2, Arterial: 58 mmHg — ABNORMAL LOW (ref 80.0–100.0)
pO2, Arterial: 75 mmHg — ABNORMAL LOW (ref 80.0–100.0)
pO2, Arterial: 86 mmHg (ref 80.0–100.0)

## 2013-01-05 LAB — GLUCOSE, CAPILLARY
Glucose-Capillary: 164 mg/dL — ABNORMAL HIGH (ref 70–99)
Glucose-Capillary: 98 mg/dL (ref 70–99)
Glucose-Capillary: 108 mg/dL — ABNORMAL HIGH (ref 70–99)

## 2013-01-05 LAB — MAGNESIUM: Magnesium: 2.8 mg/dL — ABNORMAL HIGH (ref 1.5–2.5)

## 2013-01-05 LAB — BASIC METABOLIC PANEL
Calcium: 8.8 mg/dL (ref 8.4–10.5)
Creatinine, Ser: 1.35 mg/dL (ref 0.50–1.35)
GFR calc Af Amer: 56 mL/min — ABNORMAL LOW (ref >90)
GFR calc non Af Amer: 48 mL/min — ABNORMAL LOW (ref >90)
Glucose, Bld: 168 mg/dL — ABNORMAL HIGH (ref 70–99)
Sodium: 140 mEq/L (ref 135–145)

## 2013-01-05 LAB — HEMOGLOBIN AND HEMATOCRIT, BLOOD
HCT: 27 % — ABNORMAL LOW (ref 39.0–52.0)
Hemoglobin: 9.5 g/dL — ABNORMAL LOW (ref 13.0–17.0)

## 2013-01-05 LAB — URINE CULTURE

## 2013-01-05 LAB — POCT I-STAT 4, (NA,K, GLUC, HGB,HCT)
Glucose, Bld: 123 mg/dL — ABNORMAL HIGH (ref 70–99)
Hemoglobin: 11.2 g/dL — ABNORMAL LOW (ref 13.0–17.0)
Potassium: 3.7 mEq/L (ref 3.5–5.1)

## 2013-01-05 LAB — PLATELET COUNT: Platelets: 142 10*3/uL — ABNORMAL LOW (ref 150–400)

## 2013-01-05 LAB — CREATININE, SERUM: GFR calc Af Amer: 77 mL/min — ABNORMAL LOW (ref >90)

## 2013-01-05 LAB — PROTIME-INR: Prothrombin Time: 16.8 seconds — ABNORMAL HIGH (ref 11.6–15.2)

## 2013-01-05 SURGERY — CORONARY ARTERY BYPASS GRAFTING (CABG)
Anesthesia: General | Site: Chest

## 2013-01-05 MED ORDER — CHLORHEXIDINE GLUCONATE CLOTH 2 % EX PADS
6.0000 | MEDICATED_PAD | Freq: Every day | CUTANEOUS | Status: DC
  Administered 2013-01-05 – 2013-01-08 (×4): 6 via TOPICAL

## 2013-01-05 MED ORDER — MORPHINE SULFATE 2 MG/ML IJ SOLN: 1.0000 mg | INTRAMUSCULAR | Status: AC | PRN

## 2013-01-05 MED ORDER — PANTOPRAZOLE SODIUM 40 MG PO TBEC
40.0000 mg | DELAYED_RELEASE_TABLET | Freq: Every day | ORAL | Status: DC
  Administered 2013-01-07 – 2013-01-10 (×4): 40 mg via ORAL
  Filled 2013-01-05 (×4): qty 1

## 2013-01-05 MED ORDER — MIDAZOLAM HCL 5 MG/5ML IJ SOLN
INTRAMUSCULAR | Status: DC | PRN
  Administered 2013-01-05: 2 mg via INTRAVENOUS
  Administered 2013-01-05: 4 mg via INTRAVENOUS
  Administered 2013-01-05 (×2): 2 mg via INTRAVENOUS

## 2013-01-05 MED ORDER — FENTANYL CITRATE 0.05 MG/ML IJ SOLN
INTRAMUSCULAR | Status: DC | PRN
  Administered 2013-01-05: 150 ug via INTRAVENOUS
  Administered 2013-01-05: 300 ug via INTRAVENOUS
  Administered 2013-01-05: 50 ug via INTRAVENOUS
  Administered 2013-01-05: 400 ug via INTRAVENOUS
  Administered 2013-01-05: 250 ug via INTRAVENOUS

## 2013-01-05 MED ORDER — LIDOCAINE HCL (CARDIAC) 20 MG/ML IV SOLN
INTRAVENOUS | Status: DC | PRN
  Administered 2013-01-05: 100 mg via INTRAVENOUS

## 2013-01-05 MED ORDER — ACETAMINOPHEN 160 MG/5ML PO SOLN
1000.0000 mg | Freq: Four times a day (QID) | ORAL | Status: DC
  Filled 2013-01-05: qty 40

## 2013-01-05 MED ORDER — HEPARIN SODIUM (PORCINE) 1000 UNIT/ML IJ SOLN
INTRAMUSCULAR | Status: DC | PRN
  Administered 2013-01-05: 2 mL via INTRAVENOUS
  Administered 2013-01-05: 39 mL via INTRAVENOUS

## 2013-01-05 MED ORDER — LACTATED RINGERS IV SOLN: 500.0000 mL | Freq: Once | INTRAVENOUS | Status: AC | PRN

## 2013-01-05 MED ORDER — METOPROLOL TARTRATE 1 MG/ML IV SOLN: 2.5000 mg | INTRAVENOUS | Status: DC | PRN

## 2013-01-05 MED ORDER — HEMOSTATIC AGENTS (NO CHARGE) OPTIME
TOPICAL | Status: DC | PRN
  Administered 2013-01-05: 1 via TOPICAL

## 2013-01-05 MED ORDER — DOCUSATE SODIUM 100 MG PO CAPS
200.0000 mg | ORAL_CAPSULE | Freq: Every day | ORAL | Status: DC
  Administered 2013-01-06 – 2013-01-09 (×4): 200 mg via ORAL
  Filled 2013-01-05 (×5): qty 2

## 2013-01-05 MED ORDER — ALBUMIN HUMAN 5 % IV SOLN
INTRAVENOUS | Status: DC | PRN
  Administered 2013-01-05: 14:00:00 via INTRAVENOUS

## 2013-01-05 MED ORDER — SODIUM CHLORIDE 0.9 % IV SOLN
0.5000 g/h | INTRAVENOUS | Status: DC
  Filled 2013-01-05: qty 20

## 2013-01-05 MED ORDER — LACTATED RINGERS IV SOLN
INTRAVENOUS | Status: DC | PRN
  Administered 2013-01-05: 08:00:00 via INTRAVENOUS

## 2013-01-05 MED ORDER — DOPAMINE-DEXTROSE 3.2-5 MG/ML-% IV SOLN
INTRAVENOUS | Status: DC | PRN
  Administered 2013-01-05: 3 ug/kg/min via INTRAVENOUS

## 2013-01-05 MED ORDER — SODIUM CHLORIDE 0.9 % IV SOLN
100.0000 [IU] | INTRAVENOUS | Status: DC | PRN
  Administered 2013-01-05: 3.5 [IU]/h via INTRAVENOUS

## 2013-01-05 MED ORDER — DOPAMINE-DEXTROSE 3.2-5 MG/ML-% IV SOLN: 3.0000 ug/kg/min | INTRAVENOUS | Status: DC

## 2013-01-05 MED ORDER — PHENYLEPHRINE HCL 10 MG/ML IJ SOLN
10.0000 mg | INTRAVENOUS | Status: DC | PRN
  Administered 2013-01-05: 40 ug/min via INTRAVENOUS

## 2013-01-05 MED ORDER — ALBUMIN HUMAN 5 % IV SOLN
250.0000 mL | INTRAVENOUS | Status: AC | PRN
  Administered 2013-01-05 (×4): 250 mL via INTRAVENOUS
  Filled 2013-01-05: qty 250

## 2013-01-05 MED ORDER — METOPROLOL TARTRATE 12.5 MG HALF TABLET
12.5000 mg | ORAL_TABLET | Freq: Two times a day (BID) | ORAL | Status: DC
  Administered 2013-01-06 (×2): 12.5 mg via ORAL
  Filled 2013-01-05 (×5): qty 1

## 2013-01-05 MED ORDER — ROCURONIUM BROMIDE 100 MG/10ML IV SOLN
INTRAVENOUS | Status: DC | PRN
  Administered 2013-01-05: 50 mg via INTRAVENOUS
  Administered 2013-01-05: 80 mg via INTRAVENOUS
  Administered 2013-01-05: 70 mg via INTRAVENOUS

## 2013-01-05 MED ORDER — SODIUM CHLORIDE 0.9 % IV SOLN: 250.0000 mL | INTRAVENOUS | Status: DC

## 2013-01-05 MED ORDER — ACETAMINOPHEN 500 MG PO TABS
1000.0000 mg | ORAL_TABLET | Freq: Four times a day (QID) | ORAL | Status: DC
  Administered 2013-01-06 – 2013-01-10 (×16): 1000 mg via ORAL
  Filled 2013-01-05 (×18): qty 2

## 2013-01-05 MED ORDER — POTASSIUM CHLORIDE 10 MEQ/50ML IV SOLN
10.0000 meq | INTRAVENOUS | Status: AC
  Administered 2013-01-05 (×3): 10 meq via INTRAVENOUS

## 2013-01-05 MED ORDER — VANCOMYCIN HCL IN DEXTROSE 1-5 GM/200ML-% IV SOLN
1000.0000 mg | Freq: Once | INTRAVENOUS | Status: AC
  Administered 2013-01-05: 1000 mg via INTRAVENOUS
  Filled 2013-01-05: qty 200

## 2013-01-05 MED ORDER — SODIUM CHLORIDE 0.9 % IV SOLN
200.0000 ug | INTRAVENOUS | Status: DC | PRN
  Administered 2013-01-05: 0.2 ug/kg/h via INTRAVENOUS

## 2013-01-05 MED ORDER — MAGNESIUM SULFATE 40 MG/ML IJ SOLN
4.0000 g | Freq: Once | INTRAMUSCULAR | Status: AC
  Administered 2013-01-05: 4 g via INTRAVENOUS
  Filled 2013-01-05: qty 100

## 2013-01-05 MED ORDER — LACTATED RINGERS IV SOLN
INTRAVENOUS | Status: DC | PRN
  Administered 2013-01-05 (×2): via INTRAVENOUS

## 2013-01-05 MED ORDER — OXYCODONE HCL 5 MG PO TABS
5.0000 mg | ORAL_TABLET | ORAL | Status: DC | PRN
  Administered 2013-01-06: 10 mg via ORAL
  Administered 2013-01-06 (×2): 5 mg via ORAL
  Administered 2013-01-07: 10 mg via ORAL
  Filled 2013-01-05: qty 2
  Filled 2013-01-05 (×3): qty 1
  Filled 2013-01-05: qty 2

## 2013-01-05 MED ORDER — METOPROLOL TARTRATE 25 MG/10 ML ORAL SUSPENSION
12.5000 mg | Freq: Two times a day (BID) | ORAL | Status: DC
  Filled 2013-01-05 (×5): qty 5

## 2013-01-05 MED ORDER — SODIUM CHLORIDE 0.9 % IJ SOLN
OROMUCOSAL | Status: DC | PRN
  Administered 2013-01-05: 08:00:00 via TOPICAL

## 2013-01-05 MED ORDER — FAMOTIDINE IN NACL 20-0.9 MG/50ML-% IV SOLN
20.0000 mg | Freq: Two times a day (BID) | INTRAVENOUS | Status: AC
  Administered 2013-01-05: 20 mg via INTRAVENOUS

## 2013-01-05 MED ORDER — SODIUM CHLORIDE 0.9 % IJ SOLN: 3.0000 mL | INTRAMUSCULAR | Status: DC | PRN

## 2013-01-05 MED ORDER — ACETAMINOPHEN 160 MG/5ML PO SOLN: 650.0000 mg | Freq: Once | ORAL | Status: AC

## 2013-01-05 MED ORDER — SODIUM CHLORIDE 0.9 % IV SOLN
INTRAVENOUS | Status: DC
  Administered 2013-01-05: 3.2 [IU]/h via INTRAVENOUS
  Administered 2013-01-07: 1.8 [IU]/h via INTRAVENOUS
  Filled 2013-01-05 (×2): qty 1

## 2013-01-05 MED ORDER — BISACODYL 5 MG PO TBEC
10.0000 mg | DELAYED_RELEASE_TABLET | Freq: Every day | ORAL | Status: DC
  Administered 2013-01-06 – 2013-01-09 (×4): 10 mg via ORAL
  Filled 2013-01-05 (×6): qty 2

## 2013-01-05 MED ORDER — MUPIROCIN 2 % EX OINT
1.0000 "application " | TOPICAL_OINTMENT | Freq: Two times a day (BID) | CUTANEOUS | Status: AC
  Administered 2013-01-05 – 2013-01-10 (×10): 1 via NASAL
  Filled 2013-01-05: qty 22

## 2013-01-05 MED ORDER — PHENYLEPHRINE HCL 10 MG/ML IJ SOLN: 0.0000 ug/min | INTRAVENOUS | Status: DC

## 2013-01-05 MED ORDER — ASPIRIN EC 325 MG PO TBEC
325.0000 mg | DELAYED_RELEASE_TABLET | Freq: Every day | ORAL | Status: DC
  Administered 2013-01-06 – 2013-01-10 (×5): 325 mg via ORAL
  Filled 2013-01-05 (×5): qty 1

## 2013-01-05 MED ORDER — ACETAMINOPHEN 650 MG RE SUPP
650.0000 mg | Freq: Once | RECTAL | Status: AC
  Administered 2013-01-05: 650 mg via RECTAL

## 2013-01-05 MED ORDER — SODIUM CHLORIDE 0.9 % IV SOLN
20.0000 mg | INTRAVENOUS | Status: DC | PRN
  Administered 2013-01-05: 20 ug/min via INTRAVENOUS

## 2013-01-05 MED ORDER — DEXTROSE 5 % IV SOLN
30.0000 ug/min | INTRAVENOUS | Status: DC
  Filled 2013-01-05: qty 2

## 2013-01-05 MED ORDER — PROTAMINE SULFATE 10 MG/ML IV SOLN
INTRAVENOUS | Status: DC | PRN
  Administered 2013-01-05: 390 mg via INTRAVENOUS
  Administered 2013-01-05: 10 mg via INTRAVENOUS

## 2013-01-05 MED ORDER — POTASSIUM CHLORIDE 10 MEQ/50ML IV SOLN
10.0000 meq | Freq: Once | INTRAVENOUS | Status: DC
  Administered 2013-01-05: 10 meq via INTRAVENOUS

## 2013-01-05 MED ORDER — SUCCINYLCHOLINE CHLORIDE 20 MG/ML IJ SOLN
INTRAMUSCULAR | Status: DC | PRN
  Administered 2013-01-05: 140 mg via INTRAVENOUS

## 2013-01-05 MED ORDER — INSULIN REGULAR BOLUS VIA INFUSION
0.0000 [IU] | Freq: Three times a day (TID) | INTRAVENOUS | Status: DC
  Administered 2013-01-06: 6.1 [IU] via INTRAVENOUS
  Filled 2013-01-05: qty 10

## 2013-01-05 MED ORDER — SODIUM CHLORIDE 0.9 % IV SOLN
INTRAVENOUS | Status: DC | PRN
  Administered 2013-01-05: 14:00:00 via INTRAVENOUS

## 2013-01-05 MED ORDER — SODIUM CHLORIDE 0.9 % IV SOLN
10.0000 g | INTRAVENOUS | Status: DC | PRN
  Administered 2013-01-05: 5 g/h via INTRAVENOUS

## 2013-01-05 MED ORDER — SODIUM CHLORIDE 0.9 % IV SOLN: INTRAVENOUS | Status: DC

## 2013-01-05 MED ORDER — SODIUM CHLORIDE 0.9 % IJ SOLN
3.0000 mL | Freq: Two times a day (BID) | INTRAMUSCULAR | Status: DC
  Administered 2013-01-06 – 2013-01-08 (×4): 3 mL via INTRAVENOUS

## 2013-01-05 MED ORDER — MORPHINE SULFATE 2 MG/ML IJ SOLN
2.0000 mg | INTRAMUSCULAR | Status: DC | PRN
  Administered 2013-01-06 – 2013-01-07 (×2): 2 mg via INTRAVENOUS
  Filled 2013-01-05 (×2): qty 1

## 2013-01-05 MED ORDER — MIDAZOLAM HCL 2 MG/2ML IJ SOLN: 2.0000 mg | INTRAMUSCULAR | Status: DC | PRN

## 2013-01-05 MED ORDER — ASPIRIN 81 MG PO CHEW: 324.0000 mg | CHEWABLE_TABLET | Freq: Every day | ORAL | Status: DC

## 2013-01-05 MED ORDER — LACTATED RINGERS IV SOLN
INTRAVENOUS | Status: DC
  Administered 2013-01-05 (×2): via INTRAVENOUS

## 2013-01-05 MED ORDER — DEXMEDETOMIDINE HCL IN NACL 200 MCG/50ML IV SOLN: 0.1000 ug/kg/h | INTRAVENOUS | Status: DC

## 2013-01-05 MED ORDER — NITROGLYCERIN IN D5W 200-5 MCG/ML-% IV SOLN: 0.0000 ug/min | INTRAVENOUS | Status: DC

## 2013-01-05 MED ORDER — PROPOFOL 10 MG/ML IV BOLUS
INTRAVENOUS | Status: DC | PRN
  Administered 2013-01-05: 60 mg via INTRAVENOUS

## 2013-01-05 MED ORDER — 0.9 % SODIUM CHLORIDE (POUR BTL) OPTIME
TOPICAL | Status: DC | PRN
  Administered 2013-01-05: 1000 mL

## 2013-01-05 MED ORDER — DEXMEDETOMIDINE HCL IN NACL 400 MCG/100ML IV SOLN
0.4000 ug/kg/h | INTRAVENOUS | Status: DC
  Filled 2013-01-05: qty 100

## 2013-01-05 MED ORDER — BISACODYL 10 MG RE SUPP: 10.0000 mg | Freq: Every day | RECTAL | Status: DC

## 2013-01-05 MED ORDER — CEFUROXIME SODIUM 1.5 G IJ SOLR
1.5000 g | Freq: Two times a day (BID) | INTRAMUSCULAR | Status: AC
  Administered 2013-01-05 – 2013-01-07 (×4): 1.5 g via INTRAVENOUS
  Filled 2013-01-05 (×4): qty 1.5

## 2013-01-05 MED ORDER — SODIUM CHLORIDE 0.45 % IV SOLN
INTRAVENOUS | Status: DC
  Administered 2013-01-05 – 2013-01-07 (×2): via INTRAVENOUS

## 2013-01-05 MED ORDER — NITROGLYCERIN IN D5W 200-5 MCG/ML-% IV SOLN
INTRAVENOUS | Status: DC | PRN
  Administered 2013-01-05: 5 ug/min via INTRAVENOUS

## 2013-01-05 MED ORDER — ONDANSETRON HCL 4 MG/2ML IJ SOLN: 4.0000 mg | Freq: Four times a day (QID) | INTRAMUSCULAR | Status: DC | PRN

## 2013-01-05 SURGICAL SUPPLY — 95 items
ATTRACTOMAT 16X20 MAGNETIC DRP (DRAPES) ×3 IMPLANT
BAG DECANTER FOR FLEXI CONT (MISCELLANEOUS) ×3 IMPLANT
BANDAGE ELASTIC 4 VELCRO ST LF (GAUZE/BANDAGES/DRESSINGS) ×3 IMPLANT
BANDAGE ELASTIC 6 VELCRO ST LF (GAUZE/BANDAGES/DRESSINGS) ×3 IMPLANT
BANDAGE GAUZE ELAST BULKY 4 IN (GAUZE/BANDAGES/DRESSINGS) ×3 IMPLANT
BASKET HEART (ORDER IN 25'S) (MISCELLANEOUS) ×1
BASKET HEART (ORDER IN 25S) (MISCELLANEOUS) ×2 IMPLANT
BLADE STERNUM SYSTEM 6 (BLADE) ×3 IMPLANT
CANISTER SUCTION 2500CC (MISCELLANEOUS) ×3 IMPLANT
CANNULA EZ GLIDE AORTIC 21FR (CANNULA) ×4 IMPLANT
CANNULA VENOUS LOW PROF 34X46 (CANNULA) IMPLANT
CANNULA VESSEL 3MM 2 BLNT TIP (CANNULA) ×1 IMPLANT
CANNULA VESSEL 3MM BLUNT TIP (CANNULA) ×1 IMPLANT
CARDIAC SUCTION (MISCELLANEOUS) ×1 IMPLANT
CATH CPB KIT HENDRICKSON (MISCELLANEOUS) ×3 IMPLANT
CATH ROBINSON RED A/P 18FR (CATHETERS) ×3 IMPLANT
CATH THORACIC 36FR (CATHETERS) ×3 IMPLANT
CATH THORACIC 36FR RT ANG (CATHETERS) ×3 IMPLANT
CLIP FOGARTY SPRING 6M (CLIP) ×2 IMPLANT
CLIP TI MEDIUM 24 (CLIP) IMPLANT
CLIP TI WIDE RED SMALL 24 (CLIP) ×4 IMPLANT
COVER SURGICAL LIGHT HANDLE (MISCELLANEOUS) ×3 IMPLANT
CRADLE DONUT ADULT HEAD (MISCELLANEOUS) ×3 IMPLANT
DRAPE CARDIOVASCULAR INCISE (DRAPES) ×3
DRAPE SLUSH/WARMER DISC (DRAPES) ×3 IMPLANT
DRAPE SRG 135X102X78XABS (DRAPES) ×2 IMPLANT
DRSG COVADERM 4X14 (GAUZE/BANDAGES/DRESSINGS) ×3 IMPLANT
ELECT REM PT RETURN 9FT ADLT (ELECTROSURGICAL) ×6
ELECTRODE REM PT RTRN 9FT ADLT (ELECTROSURGICAL) ×4 IMPLANT
GLOVE BIO SURGEON STRL SZ 6 (GLOVE) ×2 IMPLANT
GLOVE BIO SURGEON STRL SZ 6.5 (GLOVE) ×3 IMPLANT
GLOVE BIOGEL PI IND STRL 6 (GLOVE) IMPLANT
GLOVE BIOGEL PI IND STRL 6.5 (GLOVE) IMPLANT
GLOVE BIOGEL PI IND STRL 7.0 (GLOVE) IMPLANT
GLOVE BIOGEL PI INDICATOR 6 (GLOVE) ×1
GLOVE BIOGEL PI INDICATOR 6.5 (GLOVE) ×6
GLOVE BIOGEL PI INDICATOR 7.0 (GLOVE) ×5
GLOVE EUDERMIC 7 POWDERFREE (GLOVE) ×9 IMPLANT
GOWN PREVENTION PLUS XLARGE (GOWN DISPOSABLE) ×6 IMPLANT
GOWN STRL NON-REIN LRG LVL3 (GOWN DISPOSABLE) ×14 IMPLANT
HEMOSTAT POWDER SURGIFOAM 1G (HEMOSTASIS) ×9 IMPLANT
HEMOSTAT SURGICEL 2X14 (HEMOSTASIS) ×3 IMPLANT
INSERT FOGARTY XLG (MISCELLANEOUS) IMPLANT
KIT BASIN OR (CUSTOM PROCEDURE TRAY) ×3 IMPLANT
KIT ROOM TURNOVER OR (KITS) ×3 IMPLANT
KIT SUCTION CATH 14FR (SUCTIONS) ×6 IMPLANT
KIT VASOVIEW W/TROCAR VH 2000 (KITS) ×3 IMPLANT
MARKER GRAFT CORONARY BYPASS (MISCELLANEOUS) ×9 IMPLANT
NDL 27GX1/2 REG BEVEL ECLIP (NEEDLE) IMPLANT
NEEDLE 27GX1/2 REG BEVEL ECLIP (NEEDLE) ×3 IMPLANT
NS IRRIG 1000ML POUR BTL (IV SOLUTION) ×16 IMPLANT
PACK OPEN HEART (CUSTOM PROCEDURE TRAY) ×3 IMPLANT
PAD ARMBOARD 7.5X6 YLW CONV (MISCELLANEOUS) ×6 IMPLANT
PAD ELECT DEFIB RADIOL ZOLL (MISCELLANEOUS) ×3 IMPLANT
PENCIL BUTTON HOLSTER BLD 10FT (ELECTRODE) ×3 IMPLANT
PUNCH AORTIC ROTATE 4.0MM (MISCELLANEOUS) ×1 IMPLANT
PUNCH AORTIC ROTATE 4.5MM 8IN (MISCELLANEOUS) IMPLANT
PUNCH AORTIC ROTATE 5MM 8IN (MISCELLANEOUS) IMPLANT
SPONGE GAUZE 4X4 12PLY (GAUZE/BANDAGES/DRESSINGS) ×6 IMPLANT
SPONGE LAP 18X18 X RAY DECT (DISPOSABLE) ×1 IMPLANT
SUT BONE WAX W31G (SUTURE) ×3 IMPLANT
SUT MNCRL AB 4-0 PS2 18 (SUTURE) ×2 IMPLANT
SUT PROLENE 3 0 SH DA (SUTURE) ×3 IMPLANT
SUT PROLENE 4 0 RB 1 (SUTURE)
SUT PROLENE 4 0 SH DA (SUTURE) IMPLANT
SUT PROLENE 4-0 RB1 .5 CRCL 36 (SUTURE) IMPLANT
SUT PROLENE 6 0 C 1 30 (SUTURE) ×6 IMPLANT
SUT PROLENE 7 0 BV1 MDA (SUTURE) ×6 IMPLANT
SUT PROLENE 8 0 BV175 6 (SUTURE) IMPLANT
SUT SILK  1 MH (SUTURE)
SUT SILK 1 MH (SUTURE) IMPLANT
SUT STEEL 6MS V (SUTURE) ×1 IMPLANT
SUT STEEL STERNAL CCS#1 18IN (SUTURE) IMPLANT
SUT STEEL SZ 6 DBL 3X14 BALL (SUTURE) ×1 IMPLANT
SUT VIC AB 1 CTX 36 (SUTURE) ×9
SUT VIC AB 1 CTX36XBRD ANBCTR (SUTURE) ×4 IMPLANT
SUT VIC AB 2-0 CT1 27 (SUTURE) ×3
SUT VIC AB 2-0 CT1 TAPERPNT 27 (SUTURE) IMPLANT
SUT VIC AB 2-0 CTX 27 (SUTURE) IMPLANT
SUT VIC AB 3-0 SH 27 (SUTURE)
SUT VIC AB 3-0 SH 27X BRD (SUTURE) IMPLANT
SUT VIC AB 3-0 X1 27 (SUTURE) IMPLANT
SUT VICRYL 4-0 PS2 18IN ABS (SUTURE) ×1 IMPLANT
SUTURE E-PAK OPEN HEART (SUTURE) ×3 IMPLANT
SYR 5ML LL (SYRINGE) ×1 IMPLANT
SYSTEM SAHARA CHEST DRAIN ATS (WOUND CARE) ×3 IMPLANT
TAPE CLOTH SURG 4X10 WHT LF (GAUZE/BANDAGES/DRESSINGS) ×1 IMPLANT
TAPE PAPER 2X10 WHT MICROPORE (GAUZE/BANDAGES/DRESSINGS) ×1 IMPLANT
TOWEL OR 17X24 6PK STRL BLUE (TOWEL DISPOSABLE) ×6 IMPLANT
TOWEL OR 17X26 10 PK STRL BLUE (TOWEL DISPOSABLE) ×6 IMPLANT
TRAY FOLEY IC TEMP SENS 14FR (CATHETERS) ×3 IMPLANT
TUBE FEEDING 8FR 16IN STR KANG (MISCELLANEOUS) ×3 IMPLANT
TUBING INSUFFLATION 10FT LAP (TUBING) ×3 IMPLANT
UNDERPAD 30X30 INCONTINENT (UNDERPADS AND DIAPERS) ×3 IMPLANT
WATER STERILE IRR 1000ML POUR (IV SOLUTION) ×6 IMPLANT

## 2013-01-05 NOTE — Brief Op Note (Signed)
01/01/2013 - 01/05/2013  1:03 PM  PATIENT:  [name] [name]  80 y.o. male  PRE-OPERATIVE DIAGNOSIS:  CAD  POST-OPERATIVE DIAGNOSIS:  CAD  PROCEDURE:   CORONARY ARTERY BYPASS GRAFTING x 4  (LIMA-LAD, SVG-OM1, SVG-AM-PD) ENDOSCOPIC VEIN HARVEST BILATERAL THIGHS  SURGEON:  Loreli Slot, MD  ASSISTANT:  Coral Ceo, PA-C  ANESTHESIA:   general  PATIENT CONDITION:  ICU - intubated and hemodynamically stable.  PRE-OPERATIVE WEIGHT: 112 kg

## 2013-01-05 NOTE — Progress Notes (Signed)
01/05/13 1545  Vitals  Pulse Rate 86  ECG Heart Rate 86  Cardiac Rhythm Atrial paced  Resp 12  Oxygen Therapy  SpO2 100 %  Art Line  Arterial Line BP 128/68 mmHg  Arterial Line MAP (mmHg) 88 mmHg  Invasive Hemodynamic Monitoring  PAP 37/20 mmHg  PAP (Mean) 27 mmHg  AAI pacing started at rate of 86. MA 10 and sensitivity 0.5.

## 2013-01-05 NOTE — Anesthesia Procedure Notes (Addendum)
Procedure Name: Intubation Date/Time: 01/05/2013 8:53 AM Performed by: Rogelia Boga Pre-anesthesia Checklist: Patient identified, Emergency Drugs available, Suction available, Patient being monitored and Timeout performed Patient Re-evaluated:Patient Re-evaluated prior to inductionOxygen Delivery Method: Circle system utilized Preoxygenation: Pre-oxygenation with 100% oxygen Intubation Type: IV induction Ventilation: Mask ventilation without difficulty and Oral airway inserted - appropriate to patient size Laryngoscope Size: Mac and 4 Grade View: Grade I Tube type: Oral Tube size: 8.0 mm Number of attempts: 1 Airway Equipment and Method: Stylet Placement Confirmation: ETT inserted through vocal cords under direct vision,  positive ETCO2 and breath sounds checked- equal and bilateral Secured at: 22 cm Tube secured with: Tape Dental Injury: Teeth and Oropharynx as per pre-operative assessment    Performed by: Kirt Boys P      Right IJ Introducer needle for Swan placement.

## 2013-01-05 NOTE — Progress Notes (Signed)
01/05/13 1905  Vitals  Temp 99 F (37.2 C)  Pulse Rate 86  ECG Heart Rate 87  Resp 19  Oxygen Therapy  SpO2 100 %  Art Line  Arterial Line BP 128/72 mmHg  Arterial Line MAP (mmHg) 89 mmHg  Invasive Hemodynamic Monitoring  PAP 41/27 mmHg  PAP (Mean) 33 mmHg  CO (L/min) 3.9 L/min  CI (L/min/m2) 1.7 L/min/m2  Albumin 5% 250 ml IV hung for CI 1.7.

## 2013-01-05 NOTE — Progress Notes (Signed)
Weaning parameters done with patient, NIF -40, VC 500 ml.  Patient with poor effort and lethargic.  Discussed with patient's nurse, placed back on full support.  Will re-assess in about an hour.

## 2013-01-05 NOTE — Progress Notes (Signed)
Echocardiogram Echocardiogram Transesophageal has been performed.  [name] [name] 01/05/2013, 9:26 AM

## 2013-01-05 NOTE — Anesthesia Preprocedure Evaluation (Addendum)
Anesthesia Evaluation  Patient identified by MRN, date of birth, ID band Patient awake    Reviewed: Allergy & Precautions, H&P , NPO status , Patient's Chart, lab work & pertinent test results  Airway Mallampati: II TM Distance: >3 FB Neck ROM: Full    Dental  (+) Edentulous Upper, Edentulous Lower and Dental Advisory Given   Pulmonary former smoker,  breath sounds clear to auscultation        Cardiovascular hypertension, Pt. on medications + angina with exertion + CAD, + Past MI, + Peripheral Vascular Disease and +CHF Rhythm:Regular Rate:Normal     Neuro/Psych  Headaches,    GI/Hepatic   Endo/Other  diabetes, Well Controlled, Type 2, Insulin DependentMorbid obesity  Renal/GU      Musculoskeletal   Abdominal (+) + obese,   Peds  Hematology   Anesthesia Other Findings   Reproductive/Obstetrics                         Anesthesia Physical Anesthesia Plan  ASA: IV  Anesthesia Plan: General   Post-op Pain Management:    Induction: Intravenous  Airway Management Planned: Oral ETT  Additional Equipment: Ultrasound Guidance Line Placement, Arterial line and PA Cath  Intra-op Plan:   Post-operative Plan: Post-operative intubation/ventilation  Informed Consent: I have reviewed the patients History and Physical, chart, labs and discussed the procedure including the risks, benefits and alternatives for the proposed anesthesia with the patient or authorized representative who has indicated his/her understanding and acceptance.   Dental advisory given  Plan Discussed with: CRNA, Anesthesiologist and Surgeon  Anesthesia Plan Comments:         Anesthesia Quick Evaluation

## 2013-01-05 NOTE — Procedures (Signed)
Extubation Procedure Note  Patient Details:   Name: Chez Bulnes DOB: 09-03-1931 MRN: [id number]   Airway Documentation:  Airway 8 mm (Active)  Secured at (cm) 22 cm 01/05/2013 11:00 PM  Measured From Lips 01/05/2013 11:00 PM  Secured Location Right 01/05/2013 11:00 PM  Secured By Pink Tape 01/05/2013 11:00 PM  Cuff Pressure (cm H2O) 24 cm H2O 01/05/2013  6:34 PM    Evaluation  O2 sats: stable throughout Complications: No apparent complications Patient did tolerate procedure well. Bilateral Breath Sounds: Clear   Yes  Patient was on PS/CPAP 10/5 40% for 20 minutes.  RN obtained ABG which was within normal limits.  Patients vital capacity was 700 ml, NIF was -40.  Patient was able to follow all commands. RT suctioned patients mouth and suctioned down ETT.  RT extubated patient per protocol and placed patient on 3L [place].  Patient was able to state name post procedure and has an effective productive cough. BBS are clear and diminished. RN was at bedside for the whole extubation process.  RT will continue to monitor.  Gabriela Eves 01/05/2013, 11:32 PM

## 2013-01-05 NOTE — Preoperative (Signed)
2Beta Blockers   Reason not to administer Beta Blockers:Not Applicable, Metoprolol given at 0720 this am

## 2013-01-05 NOTE — Transfer of Care (Signed)
Immediate Anesthesia Transfer of Care Note  Patient: [name] [name]  Procedure(s) Performed: Procedure(s): CORONARY ARTERY BYPASS GRAFTING (CABG) (N/A) INTRAOPERATIVE TRANSESOPHAGEAL ECHOCARDIOGRAM (N/A)  Patient Location: PACU  Anesthesia Type:General  Level of Consciousness: unresponsive and Patient remains intubated per anesthesia plan  Airway & Oxygen Therapy: Patient remains intubated per anesthesia plan and Patient placed on Ventilator (see vital sign flow sheet for setting)  Post-op Assessment: Post -op Vital signs reviewed and stable and Report given to Misty Stanley, RN from 2300 in the PACU  Post vital signs: Reviewed and stable  Complications: No apparent anesthesia complications

## 2013-01-05 NOTE — Progress Notes (Signed)
Patient placed on a second CPAP trial.  RT will attempt mechanics again in 20 minutes.

## 2013-01-05 NOTE — Progress Notes (Signed)
01/05/13 1500  Vitals  Pulse Rate 79  Pulse Rate Source Monitor  ECG Heart Rate 79  Resp 12  Oxygen Therapy  SpO2 92 %  O2 Device Ventilator  FiO2 (%) 80 % (increased per abg results)  Pulse Oximetry Type Continuous  Pre-WUA / WUA Start  RASS Goal -4  [place] Agitation Sedation Scale (RASS) -4  Art Line  Arterial Line BP 76/43 mmHg  Arterial Line MAP (mmHg) 52 mmHg  Arterial Line Location Left radial  Art Line Wave Form Appropriate wave forms;Rezeroed  Invasive Hemodynamic Monitoring  PAP 25/10 mmHg  PAP (Mean) 15 mmHg  Pain Assessment  Pain Assessment CPOT  Critical Care Pain Observation Tool (CPOT)  Facial Expression 0  Body Movements 0  Muscle Tension 0  Compliance with ventilator (intubated pts.) 0  Vocalization (extubated pts.) N/A  CPOT Total 0  Glasgow Coma Scale  Eye Opening 1  Best Verbal Response 0  Modified Verbal Response 1  Best Motor Response 1  Glasgow Coma Scale Score 3  Height and Weight  Height 5\' 9"  (1.753 m)  Weight 111.9 kg (246 lb 11.1 oz)  BSA (Calculated - sq m) 2.33 sq meters  BMI (Calculated) 36.5  Weight in (lb) to have BMI = 25 168.9  Albumin 5% 250 ml hung and phenylephrine drip increased to 20 mcg/min.

## 2013-01-05 NOTE — Anesthesia Postprocedure Evaluation (Signed)
  Anesthesia Post-op Note  Patient: [name] [name]  Procedure(s) Performed: Procedure(s): CORONARY ARTERY BYPASS GRAFTING (CABG) (N/A) INTRAOPERATIVE TRANSESOPHAGEAL ECHOCARDIOGRAM (N/A)  Patient Location: SICU  Anesthesia Type:General  Level of Consciousness: sedated and Patient remains intubated per anesthesia plan  Airway and Oxygen Therapy: Patient remains intubated per anesthesia plan and Patient placed on Ventilator (see vital sign flow sheet for setting)  Post-op Pain: none  Post-op Assessment: Post-op Vital signs reviewed, Patient's Cardiovascular Status Stable and Respiratory Function Stable  Post-op Vital Signs: stable  Complications: No apparent anesthesia complications
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
## 2013-01-05 NOTE — Progress Notes (Signed)
01/05/13 1530  Vitals  Pulse Rate 74  ECG Heart Rate 74  Resp 14  Oxygen Therapy  SpO2 100 %  Art Line  Arterial Line BP 97/53 mmHg  Arterial Line MAP (mmHg) 66 mmHg  Invasive Hemodynamic Monitoring  PAP 32/17 mmHg  PAP (Mean) 23 mmHg  CO (L/min) 4.1 L/min  CI (L/min/m2) 1.8 L/min/m2  Albumin 5% 250 ml hung.

## 2013-01-05 NOTE — Interval H&P Note (Signed)
History and Physical Interval Note:  01/05/2013 7:59 AM  [name] [name]  has presented today for surgery, with the diagnosis of CAD  The various methods of treatment have been discussed with the patient and family. After consideration of risks, benefits and other options for treatment, the patient has consented to  Procedure(s): CORONARY ARTERY BYPASS GRAFTING (CABG) (N/A) INTRAOPERATIVE TRANSESOPHAGEAL ECHOCARDIOGRAM (N/A) as a surgical intervention .  The patient's history has been reviewed, patient examined, no change in status, stable for surgery.  I have reviewed the patient's chart and labs.  Questions were answered to the patient's satisfaction.     Huston Stonehocker C

## 2013-01-05 NOTE — Progress Notes (Signed)
01/05/13 1653  Vitals  Temp ! 96.8 F (36 C)  Pulse Rate 86  ECG Heart Rate 87  Resp 18  Oxygen Therapy  SpO2 100 %  FiO2 (%) 50 %  Art Line  Arterial Line BP 115/64 mmHg  Arterial Line MAP (mmHg) 79 mmHg  Invasive Hemodynamic Monitoring  PAP 34/20 mmHg  PAP (Mean) 25 mmHg  CO (L/min) 4.1 L/min  CI (L/min/m2) 1.8 L/min/m2  Albumin 5% 250 ml IV given for CI 1.77.

## 2013-01-06 ENCOUNTER — Inpatient Hospital Stay (HOSPITAL_COMMUNITY): Payer: Medicare Other

## 2013-01-06 LAB — GLUCOSE, CAPILLARY
Glucose-Capillary: 101 mg/dL — ABNORMAL HIGH (ref 70–99)
Glucose-Capillary: 106 mg/dL — ABNORMAL HIGH (ref 70–99)
Glucose-Capillary: 113 mg/dL — ABNORMAL HIGH (ref 70–99)
Glucose-Capillary: 123 mg/dL — ABNORMAL HIGH (ref 70–99)
Glucose-Capillary: 126 mg/dL — ABNORMAL HIGH (ref 70–99)
Glucose-Capillary: 126 mg/dL — ABNORMAL HIGH (ref 70–99)
Glucose-Capillary: 130 mg/dL — ABNORMAL HIGH (ref 70–99)
Glucose-Capillary: 141 mg/dL — ABNORMAL HIGH (ref 70–99)
Glucose-Capillary: 151 mg/dL — ABNORMAL HIGH (ref 70–99)
Glucose-Capillary: 154 mg/dL — ABNORMAL HIGH (ref 70–99)
Glucose-Capillary: 226 mg/dL — ABNORMAL HIGH (ref 70–99)
Glucose-Capillary: 83 mg/dL (ref 70–99)
Glucose-Capillary: 90 mg/dL (ref 70–99)
Glucose-Capillary: 93 mg/dL (ref 70–99)

## 2013-01-06 LAB — POCT I-STAT 3, ART BLOOD GAS (G3+)
Acid-base deficit: 1 mmol/L (ref 0.0–2.0)
Bicarbonate: 23.1 mEq/L (ref 20.0–24.0)
Bicarbonate: 23.8 mEq/L (ref 20.0–24.0)
Bicarbonate: 24 mEq/L (ref 20.0–24.0)
O2 Saturation: 100 %
Patient temperature: 37.9
TCO2: 24 mmol/L (ref 0–100)
TCO2: 25 mmol/L (ref 0–100)
pCO2 arterial: 35 mmHg (ref 35.0–45.0)
pCO2 arterial: 35.8 mmHg (ref 35.0–45.0)
pCO2 arterial: 36.3 mmHg (ref 35.0–45.0)
pCO2 arterial: 41.6 mmHg (ref 35.0–45.0)
pH, Arterial: 7.421 (ref 7.350–7.450)
pH, Arterial: 7.445 (ref 7.350–7.450)
pH, Arterial: 7.393 (ref 7.350–7.450)
pO2, Arterial: 55 mmHg — ABNORMAL LOW (ref 80.0–100.0)
pO2, Arterial: 99 mmHg (ref 80.0–100.0)
pO2, Arterial: 310 mmHg — ABNORMAL HIGH (ref 80.0–100.0)
pO2, Arterial: 87 mmHg (ref 80.0–100.0)

## 2013-01-06 LAB — POCT I-STAT 4, (NA,K, GLUC, HGB,HCT)
Glucose, Bld: 176 mg/dL — ABNORMAL HIGH (ref 70–99)
Glucose, Bld: 180 mg/dL — ABNORMAL HIGH (ref 70–99)
Glucose, Bld: 126 mg/dL — ABNORMAL HIGH (ref 70–99)
HCT: 37 % — ABNORMAL LOW (ref 39.0–52.0)
HCT: 36 % — ABNORMAL LOW (ref 39.0–52.0)
HCT: 28 % — ABNORMAL LOW (ref 39.0–52.0)
HCT: 28 % — ABNORMAL LOW (ref 39.0–52.0)
Hemoglobin: 12.6 g/dL — ABNORMAL LOW (ref 13.0–17.0)
Hemoglobin: 12.2 g/dL — ABNORMAL LOW (ref 13.0–17.0)
Hemoglobin: 8.8 g/dL — ABNORMAL LOW (ref 13.0–17.0)
Hemoglobin: 9.5 g/dL — ABNORMAL LOW (ref 13.0–17.0)
Potassium: 4.3 mEq/L (ref 3.5–5.1)
Potassium: 4 mEq/L (ref 3.5–5.1)
Potassium: 3.4 mEq/L — ABNORMAL LOW (ref 3.5–5.1)
Potassium: 4.1 mEq/L (ref 3.5–5.1)
Sodium: 140 mEq/L (ref 135–145)
Sodium: 137 mEq/L (ref 135–145)
Sodium: 138 mEq/L (ref 135–145)
Sodium: 137 mEq/L (ref 135–145)
Sodium: 138 mEq/L (ref 135–145)

## 2013-01-06 LAB — CBC
MCH: 31 pg (ref 26.0–34.0)
MCH: 31 pg (ref 26.0–34.0)
MCHC: 34 g/dL (ref 30.0–36.0)
MCHC: 33.6 g/dL (ref 30.0–36.0)
MCV: 91.1 fL (ref 78.0–100.0)
MCV: 92.5 fL (ref 78.0–100.0)
Platelets: 137 10*3/uL — ABNORMAL LOW (ref 150–400)
Platelets: 146 10*3/uL — ABNORMAL LOW (ref 150–400)
RBC: 3.26 MIL/uL — ABNORMAL LOW (ref 4.22–5.81)
RBC: 3.32 MIL/uL — ABNORMAL LOW (ref 4.22–5.81)
RDW: 13.8 % (ref 11.5–15.5)

## 2013-01-06 LAB — BASIC METABOLIC PANEL
CO2: 20 mEq/L (ref 19–32)
Calcium: 7.5 mg/dL — ABNORMAL LOW (ref 8.4–10.5)
Creatinine, Ser: 1.08 mg/dL (ref 0.50–1.35)
GFR calc Af Amer: 73 mL/min — ABNORMAL LOW (ref >90)
GFR calc non Af Amer: 63 mL/min — ABNORMAL LOW (ref >90)
Glucose, Bld: 95 mg/dL (ref 70–99)

## 2013-01-06 LAB — POCT I-STAT, CHEM 8
BUN: 23 mg/dL (ref 6–23)
Calcium, Ion: 1.2 mmol/L (ref 1.13–1.30)
Creatinine, Ser: 1.5 mg/dL — ABNORMAL HIGH (ref 0.50–1.35)
Glucose, Bld: 142 mg/dL — ABNORMAL HIGH (ref 70–99)
Hemoglobin: 10.2 g/dL — ABNORMAL LOW (ref 13.0–17.0)
TCO2: 21 mmol/L (ref 0–100)

## 2013-01-06 LAB — CREATININE, SERUM
Creatinine, Ser: 1.33 mg/dL (ref 0.50–1.35)
GFR calc Af Amer: 57 mL/min — ABNORMAL LOW (ref >90)
GFR calc non Af Amer: 49 mL/min — ABNORMAL LOW (ref >90)

## 2013-01-06 LAB — MAGNESIUM
Magnesium: 2.5 mg/dL (ref 1.5–2.5)
Magnesium: 2.5 mg/dL (ref 1.5–2.5)

## 2013-01-06 MED ORDER — INSULIN DETEMIR 100 UNIT/ML ~~LOC~~ SOLN
25.0000 [IU] | Freq: Two times a day (BID) | SUBCUTANEOUS | Status: DC
  Administered 2013-01-06: 25 [IU] via SUBCUTANEOUS
  Filled 2013-01-06 (×3): qty 0.25

## 2013-01-06 MED ORDER — ENOXAPARIN SODIUM 40 MG/0.4ML ~~LOC~~ SOLN
40.0000 mg | SUBCUTANEOUS | Status: DC
  Filled 2013-01-06: qty 0.4

## 2013-01-06 MED ORDER — ENOXAPARIN SODIUM 40 MG/0.4ML ~~LOC~~ SOLN
40.0000 mg | Freq: Every day | SUBCUTANEOUS | Status: DC
  Administered 2013-01-06 – 2013-01-09 (×4): 40 mg via SUBCUTANEOUS
  Filled 2013-01-06 (×5): qty 0.4

## 2013-01-06 MED ORDER — FUROSEMIDE 10 MG/ML IJ SOLN
40.0000 mg | Freq: Once | INTRAMUSCULAR | Status: AC
  Administered 2013-01-06: 40 mg via INTRAVENOUS

## 2013-01-06 MED ORDER — INSULIN ASPART 100 UNIT/ML ~~LOC~~ SOLN
5.0000 [IU] | Freq: Three times a day (TID) | SUBCUTANEOUS | Status: DC
  Administered 2013-01-08 – 2013-01-09 (×3): 5 [IU] via SUBCUTANEOUS

## 2013-01-06 MED ORDER — GUAIFENESIN ER 600 MG PO TB12
1200.0000 mg | ORAL_TABLET | Freq: Two times a day (BID) | ORAL | Status: DC
  Administered 2013-01-06 – 2013-01-10 (×9): 1200 mg via ORAL
  Filled 2013-01-06 (×10): qty 2

## 2013-01-06 MED ORDER — INSULIN ASPART 100 UNIT/ML ~~LOC~~ SOLN: 0.0000 [IU] | SUBCUTANEOUS | Status: DC

## 2013-01-06 MED ORDER — INSULIN DETEMIR 100 UNIT/ML ~~LOC~~ SOLN: 25.0000 [IU] | Freq: Every day | SUBCUTANEOUS | Status: DC

## 2013-01-06 MED ORDER — POTASSIUM CHLORIDE 10 MEQ/50ML IV SOLN
10.0000 meq | INTRAVENOUS | Status: AC
  Administered 2013-01-06 (×2): 10 meq via INTRAVENOUS

## 2013-01-06 MED ORDER — INSULIN DETEMIR 100 UNIT/ML ~~LOC~~ SOLN
25.0000 [IU] | Freq: Once | SUBCUTANEOUS | Status: AC
  Administered 2013-01-06: 25 [IU] via SUBCUTANEOUS
  Filled 2013-01-06: qty 0.25

## 2013-01-06 MED ORDER — CILOSTAZOL 50 MG PO TABS
50.0000 mg | ORAL_TABLET | Freq: Two times a day (BID) | ORAL | Status: DC
  Administered 2013-01-06 – 2013-01-10 (×9): 50 mg via ORAL
  Filled 2013-01-06 (×10): qty 1

## 2013-01-06 MED ORDER — ALBUTEROL SULFATE (5 MG/ML) 0.5% IN NEBU
2.5000 mg | INHALATION_SOLUTION | Freq: Four times a day (QID) | RESPIRATORY_TRACT | Status: DC
  Administered 2013-01-06 (×3): 2.5 mg via RESPIRATORY_TRACT
  Filled 2013-01-06 (×3): qty 0.5

## 2013-01-06 NOTE — Op Note (Deleted)
NAMEJAKWAN, [name] NO.:  [id number]  MEDICAL RECORD NO.:  [id number]  LOCATION:  2S07C                        FACILITY:  [facility]  PHYSICIAN:  Salvatore Decent. Dorris Fetch, M.D.DATE OF BIRTH:  10/07/1931  DATE OF PROCEDURE:  01/05/2013 DATE OF DISCHARGE:                              OPERATIVE REPORT   PREOPERATIVE DIAGNOSIS:  Severe 3-vessel coronary disease, status post non-Q-wave myocardial infarction.  POSTOPERATIVE DIAGNOSIS:  Severe 3-vessel coronary disease, status post non-Q-wave myocardial infarction.  PROCEDURE:  Median sternotomy, extracorporeal circulation, coronary artery bypass grafting x4 (left internal mammary artery to left anterior descending, saphenous vein graft to obtuse marginal 1, sequential saphenous vein graft to acute marginal and posterior descending), endoscopic vein harvest, both thighs.  SURGEON:  Salvatore Decent. Dorris Fetch, M.D.  ASSISTANT:  Coral Ceo, P.A.  ANESTHESIA:  General.  FINDINGS:  LAD and acute marginal, good quality targets.  OM 1 and posterior descending, poor quality targets.  Saphenous vein from left thigh, unusable.  Saphenous vein from right thigh, fair quality, bifurcated system.  Transesophageal echocardiography revealed inferolateral severe hypokinesis, pre and post bypass.  No significant valvular pathology.  CLINICAL NOTE:  [name] [name] is a 79 year old gentleman with known history of coronary disease.  He presented with unstable coronary syndrome and ruled in for non-Q-wave myocardial infarction.  At catheterization, he was found to have severe 3-vessel coronary disease including a tight proximal LAD lesion.  His previous stent in obtuse marginal 1 was occluded.  He was advised to undergo coronary artery bypass grafting for survival benefit and relief of symptoms.  The indications, risks, benefits, and alternatives were discussed in detail with the patient.  The risks are outlined in the patient's  consultation note.  He understood and accepted the risks and agreed to proceed.  OPERATIVE NOTE:  [name] [name] was brought to the preoperative holding room on January 05, 2013.  There Anesthesia placed a Swan-Ganz catheter and an arterial blood pressure monitoring line.  He was taken to the operating room, anesthetized, and intubated.  A Foley catheter was placed.  Intravenous antibiotics were administered.  The chest, abdomen, and legs were prepped and draped in usual sterile fashion. Transesophageal echocardiography was performed.  It showed inferolateral severe hypokinesis.  There was good anteroseptal wall motion.  There was no significant valvular pathology.  An incision was made in the medial aspect of the right leg at the level of the knee.  The greater saphenous vein was identified and harvested from the right thigh endoscopically, it was noted that there was a bifurcation in the system in the thigh and a larger branch was followed. Simultaneously with this, a median sternotomy was performed and the left internal mammary artery was harvested using standard technique.  The left internal mammary artery was a good quality vessel.  A 2000 units of heparin was administered during the vessel harvest.  After harvesting the vein from just below the knee to the groin, when examining the vein, a large portion of this was too small to utilize; therefore, decision was made to explore the left thigh and an incision was made at the knee, and the greater saphenous vein was harvested from the lower thigh endoscopically.  When this vein was  ultimately removed, it was sclerotic with varicosities and unusable.  Therefore, the second more superficial branch of the bifurcating right system was harvested endoscopically.  This turned out to be acceptable for use as a bypass graft.  After harvesting the conduits, remainder of the full heparin dose was given.  The pericardium was opened.  The  ascending aorta was inspected. There was no palpable atherosclerotic disease.  The aorta was cannulated via concentric 2-0 Ethibond pledgeted pursestring sutures.  A dual-stage venous cannula was placed via pursestring suture in the right atrial appendage.  Cardiopulmonary bypass was instituted, and the patient was cooled to 32 degrees Celsius.  The coronary arteries were inspected and anastomotic sites were chosen.  The proximal portion of the posterior descending was heavily calcified.  The second obtuse marginal was too small to graft.  The first obtuse marginal was deeply intramyocardial beyond the segment that had the stent in place.  The veins were inspected and the best available vein was selected for use as the bypass grafts.  The mammary was of sufficient length to reach the LAD.  A foam pad was placed in the pericardium to insulate the heart and protect the left phrenic nerve.  A temperature probe was placed in the myocardial septum and a cardioplegic cannula was placed in the ascending aorta.  The aorta was crossclamped.  The left ventricle was emptied via aortic root vent.  Cardiac arrest then was achieved with a combination of cold antegrade blood cardioplegia and topical iced saline.  1 L of cardioplegia was administered.  There was a rapid diastolic arrest and myocardial septal cooling to 10 degrees Celsius.  The following distal anastomoses were performed.  First, a reverse saphenous vein graft was placed sequentially to the acute marginal and posterior descending branches of the right coronary. The acute marginal was by far the better of the 2 branches and did accept a 1.5 mm probe and was good quality at the site of the anastomosis.  The vein was anastomosed side-to-side with a running 7-0 Prolene suture.  All anastomoses were probed proximally and distally at their completion.  After performing the side-to-side anastomosis, the distal end of the vein was beveled, it  was anastomosed end-to-side to the posterior descending.  This vessel was heavily calcified proximal to the anastomosis.  It was poor quality at the site of the anastomosis. Only a 1 mm probe would pass distally.  The vein was anastomosed end-to- side with running 7-0 Prolene suture.  Probe did pass distally at the completion of the anastomosis.  Cardioplegia was administered through the graft.  There was good flow and good hemostasis at both anastomoses. Additional cardioplegia was also administered down the aortic root.  Next, the heart was elevated, OM2 was once again inspected, was definitely too small to graft.  OM1 was dissected out.  Again, it was deeply intramyocardial beyond where the stent was placed.  Attempt was made to open the vessel just distal to the stent but no true lumen to be found in this area.  More distally, OM was found and it did accept a 1.5 mm probe distally.  The vein was beveled, it was of larger caliber than the vein used on the acute marginal and right coronary.  It was satisfactory.  The vein was anastomosed end-to-side with a running 7-0 Prolene suture. A 1.5 mm probe did pass distally through the anastomosis.  Cardioplegia was administered.  There was good flow and good hemostasis, although there was bleeding from  the myocardial dissection.  Next, after giving additional cardioplegia down the vein grafts and the aortic root, the left internal mammary artery was brought through a window in the pericardium.  The distal end was beveled, it was anastomosed end-to-side to the distal LAD.  The LAD was a 2 mm good quality target vessel.  The mammary was a 2 mm good quality conduit. The end-to-side anastomosis was performed with a running 8-0 Prolene suture.  At the completion of the mammary to LAD anastomosis, bulldog clamps were briefly removed to inspect for hemostasis.  Rapid septal rewarming was noted.  The bulldog clamp was replaced.  The mammary pedicle  was tacked to the epicardial surface of the heart with 6-0 Prolene sutures.  Additional cardioplegia was administered.  The vein grafts were cut to length.  The cardioplegic cannula was removed from the ascending aorta. The proximal vein graft anastomoses were performed to 4.0 mm punch aortotomies with running 6-0 Prolene sutures.  At the completion of the final proximal anastomosis, the patient was placed in Trendelenburg position.  Lidocaine was administered.  The bulldog clamp was removed from the left mammary artery.  The aortic root was de-aired and the aortic crossclamp was removed.  The aortic crossclamp time was 86 minutes.  The patient required single defibrillation with 10 joules.  He then was in sinus rhythm thereafter.  While rewarming was completed, all proximal and distal anastomoses were inspected for hemostasis.  Epicardial pacing wires were placed on the right ventricle and right atrium.  When the patient had rewarmed to a core temperature of 37 degrees Celsius, he was weaned from cardiopulmonary bypass on the first attempt.  He was DDD paced for rate and on no inotropic support at the time of separation from bypass.  The total bypass time was 121 minutes.  The initial cardiac index was greater than 2 L/minute/sq. m.  Postbypass transesophageal echocardiography revealed no change in left ventricular wall motion and no valvular abnormalities.  A test dose of protamine was administered and was well tolerated.  The atrial and aortic cannulae were removed.  The remainder of protamine was administered without incident.  The chest was irrigated with warm saline.  Hemostasis was achieved.  Chest tubes were placed into the left pleural space and the anterior mediastinum.  The pericardium was reapproximated with interrupted 3-0 silk sutures.  It came together easily without tension or kinking the underlying grafts.  The sternum was closed with a combination of single and  double heavy gauge stainless steel wires.  Pectoralis fascia, subcutaneous tissue, and skin were closed in standard fashion.  All sponge, needle, and instrument counts were correct at the end of the procedure.  There were no intraoperative complications.  The patient was taken from the operating room to the postanesthetic care unit in good condition while awaiting a surgical intensive care unit bed.     Salvatore Decent Dorris Fetch, M.D.     SCH/MEDQ  D:  01/05/2013  T:  01/06/2013  Job:  [id number]

## 2013-01-06 NOTE — Op Note (Signed)
NAMELIN, GLAZIER NO.:  [id number]  MEDICAL RECORD NO.:  [id number]  LOCATION:  2S07C                        FACILITY:  [facility]  PHYSICIAN:  Salvatore Decent. Dorris Fetch, M.D.DATE OF BIRTH:  1931-09-05  DATE OF PROCEDURE:  01/05/2013 DATE OF DISCHARGE:                              OPERATIVE REPORT   PREOPERATIVE DIAGNOSIS:  Severe 3-vessel coronary disease, status post non-Q-wave myocardial infarction.  POSTOPERATIVE DIAGNOSIS:  Severe 3-vessel coronary disease, status post non-Q-wave myocardial infarction.  PROCEDURE:  Median sternotomy, extracorporeal circulation, coronary artery bypass grafting x4 (left internal mammary artery to left anterior descending, saphenous vein graft to obtuse marginal 1, sequential saphenous vein graft to acute marginal and posterior descending), endoscopic vein harvest, both thighs.  SURGEON:  Salvatore Decent. Dorris Fetch, M.D.  ASSISTANT:  Coral Ceo, P.A.  ANESTHESIA:  General.  FINDINGS:  LAD and acute marginal, good quality targets.  OM 1 and posterior descending, poor quality targets.  Saphenous vein from left thigh, unusable.  Saphenous vein from right thigh, fair quality, bifurcated system.  Transesophageal echocardiography revealed inferolateral severe hypokinesis, pre and post bypass.  No significant valvular pathology.  CLINICAL NOTE:  Mr. Willcutt is a 79 year old gentleman with known history of coronary disease.  He presented with unstable coronary syndrome and ruled in for non-Q-wave myocardial infarction.  At catheterization, he was found to have severe 3-vessel coronary disease including a tight proximal LAD lesion.  His previous stent in obtuse marginal 1 was occluded.  He was advised to undergo coronary artery bypass grafting for survival benefit and relief of symptoms.  The indications, risks, benefits, and alternatives were discussed in detail with the patient.  The risks are outlined in the patient's  consultation note.  He understood and accepted the risks and agreed to proceed.  OPERATIVE NOTE:  Mr. Oats was brought to the preoperative holding room on January 05, 2013.  There Anesthesia placed a Swan-Ganz catheter and an arterial blood pressure monitoring line.  He was taken to the operating room, anesthetized, and intubated.  A Foley catheter was placed.  Intravenous antibiotics were administered.  The chest, abdomen, and legs were prepped and draped in usual sterile fashion. Transesophageal echocardiography was performed.  It showed inferolateral severe hypokinesis.  There was good anteroseptal wall motion.  There was no significant valvular pathology.  An incision was made in the medial aspect of the right leg at the level of the knee.  The greater saphenous vein was identified and harvested from the right thigh endoscopically, it was noted that there was a bifurcation in the system in the thigh and what was thought to be the larger branch was followed. Simultaneously with this, a median sternotomy was performed and the left internal mammary artery was harvested using standard technique.  The left internal mammary artery was a good quality vessel.  2000 units of heparin was administered during the vessel harvest.  After harvesting the vein from just below the knee to the groin, it was cannulated, dilated and examined.  A large portion of this vein was too small to utilize; therefore, the decision was made to explore the left thigh. An incision was made at the knee, and the greater saphenous vein was harvested from the  lower thigh endoscopically.  When this vein was ultimately removed, it was sclerotic with varicosities and unusable.  Therefore, the second more superficial branch of the bifurcated right system was harvested endoscopically.  This turned out to be acceptable for use as a bypass graft.  After harvesting the conduits, the remainder of the full heparin dose  was given.  The pericardium was opened.  The ascending aorta was inspected. There was no palpable atherosclerotic disease.  The aorta was cannulated via concentric 2-0 Ethibond pledgeted pursestring sutures.  A dual-stage venous cannula was placed via pursestring suture in the right atrial appendage.  Cardiopulmonary bypass was initiated, and the patient was cooled to 32 degrees Celsius.  The coronary arteries were inspected and anastomotic sites were chosen.  The proximal portion of the posterior descending was heavily calcified.  The second obtuse marginal was too small to graft.  The first obtuse marginal was deeply intramyocardial beyond the segment that had the stent in place.  The veins were inspected and the best available vein was selected for use as the bypass grafts.  The mammary was of sufficient length to reach the LAD.  A foam pad was placed in the pericardium to insulate the heart and protect the left phrenic nerve.  A temperature probe was placed in the myocardial septum and a cardioplegia cannula was placed in the ascending aorta.  The aorta was crossclamped.  The left ventricle was emptied via aortic root vent.  Cardiac arrest then was achieved with a combination of cold antegrade blood cardioplegia and topical iced saline.  1 L of cardioplegia was administered.  There was a rapid diastolic arrest and myocardial septal cooling to 10 degrees Celsius.  The following distal anastomoses were performed.  First, a reversed saphenous vein graft was placed sequentially to the acute marginal and posterior descending branches of the right coronary. The acute marginal was by far the better of the 2 branches. It did accept a 1.5 mm probe and was good quality at the site of the anastomosis.  The vein was anastomosed side-to-side with a running 7-0 Prolene suture.  All anastomoses were probed proximally and distally at their completion.  After performing the side-to-side anastomosis,  the distal end of the vein was beveled, it was anastomosed end-to-side to the posterior descending.  This vessel was heavily calcified proximal to the anastomosis.  It was poor quality at the site of the anastomosis. Only a 1 mm probe would pass distally.  The vein was anastomosed end-to- side with running 7-0 Prolene suture.  The probe did pass distally at the completion of the anastomosis.  Cardioplegia was administered through the graft.  There was good flow and good hemostasis at both anastomoses. Additional cardioplegia was also administered down the aortic root.  Next, the heart was elevated, OM2 was once again inspected, was definitely too small to graft.  OM1 was dissected out.  Again, it was deeply intramyocardial beyond where the stent was placed.  An attempt was made to open the vessel just distal to the stent but no true lumen could be found in this area.  More distally, the OM was found and it did accept a 1.5 mm probe distally.  The vein was beveled, it was of larger caliber than the vein used on the acute marginal and right coronary.  It was satisfactory.  The vein was anastomosed end-to-side with a running 7-0 Prolene suture. A 1.5 mm probe did pass distally through the anastomosis.  Cardioplegia was administered.  There was good flow and good hemostasis at the anastomosis, although there was bleeding from the myocardial dissection.  Next, after giving additional cardioplegia down the vein grafts and the aortic root, the left internal mammary artery was brought through a window in the pericardium.  The distal end was beveled, it was anastomosed end-to-side to the distal LAD.  The LAD was a 2 mm good quality target vessel.  The mammary was a 2 mm good quality conduit. The end-to-side anastomosis was performed with a running 8-0 Prolene suture.  At the completion of the mammary to LAD anastomosis, the bulldog clamp was briefly removed to inspect for hemostasis.  Rapid  septal rewarming was noted.  The bulldog clamp was replaced.  The mammary pedicle was tacked to the epicardial surface of the heart with 6-0 Prolene sutures.  Additional cardioplegia was administered.  The vein grafts were cut to length.  The cardioplegia cannula was removed from the ascending aorta. The proximal vein graft anastomoses were performed to 4.0 mm punch aortotomies with running 6-0 Prolene sutures.  At the completion of the final proximal anastomosis, the patient was placed in Trendelenburg position.  Lidocaine was administered.  The bulldog clamp was removed from the left mammary artery.  The aortic root was de-aired and the aortic crossclamp was removed.  The aortic crossclamp time was 86 minutes.  The patient required single defibrillation with 10 joules.  He then was in sinus rhythm thereafter.  While rewarming was completed, all proximal and distal anastomoses were inspected for hemostasis.  Epicardial pacing wires were placed on the right ventricle and right atrium.  When the patient had rewarmed to a core temperature of 37 degrees Celsius, he was weaned from cardiopulmonary bypass on the first attempt.  He was DDD paced for rate and on no inotropic support at the time of separation from bypass.  The total bypass time was 121 minutes.  The initial cardiac index was greater than 2 L/minute/sq. m.  Postbypass transesophageal echocardiography revealed no change in left ventricular wall motion and no valvular abnormalities.  A test dose of protamine was administered and was well tolerated.  The atrial and aortic cannulae were removed.  The remainder of protamine was administered without incident.  The chest was irrigated with warm saline.  Hemostasis was achieved.  Chest tubes were placed into the left pleural space and the anterior mediastinum.  The pericardium was reapproximated with interrupted 3-0 silk sutures.  It came together easily without tension or kinking  the underlying grafts.  The sternum was closed with a combination of single and double heavy gauge stainless steel wires.  Pectoralis fascia, subcutaneous tissue, and skin were closed in standard fashion.  All sponge, needle, and instrument counts were correct at the end of the procedure.  There were no intraoperative complications.  The patient was taken from the operating room to the postanesthetic care unit in good condition while awaiting a surgical intensive care unit bed.     Salvatore Decent Dorris Fetch, M.D.     SCH/MEDQ  D:  01/05/2013  T:  01/06/2013  Job:  [id number]

## 2013-01-06 NOTE — Progress Notes (Signed)
CT surgery p.m. Rounds  Resting comfortably after stable day status post CABG Maintaining sinus rhythm Walked in the hallway P.m. labs reviewed-hematocrit 30, creatinine 1.5, potassium 4.0

## 2013-01-06 NOTE — Progress Notes (Signed)
1 Day Post-Op Procedure(s) (LRB): CORONARY ARTERY BYPASS GRAFTING (CABG) (N/A) INTRAOPERATIVE TRANSESOPHAGEAL ECHOCARDIOGRAM (N/A) Subjective: C/o congestion and productive cough  Objective: Vital signs in last 24 hours: Temp:  [96.6 F (35.9 C)-100.4 F (38 C)] 100 F (37.8 C) (12/04 0700) Pulse Rate:  [73-90] 87 (12/04 0700) Cardiac Rhythm:  [-] Normal sinus rhythm (12/03 2000) Resp:  [10-32] 25 (12/04 0700) BP: (96-126)/(61-76) 120/65 mmHg (12/04 0700) SpO2:  [92 %-100 %] 99 % (12/04 0700) Arterial Line BP: (76-149)/(43-75) 129/57 mmHg (12/04 0700) FiO2 (%):  [40 %-80 %] 40 % (12/03 2300) Weight:  [246 lb 11.1 oz (111.9 kg)-258 lb 2.5 oz (117.1 kg)] 258 lb 2.5 oz (117.1 kg) (12/04 0500)  Hemodynamic parameters for last 24 hours: PAP: (25-65)/(6-30) 35/11 mmHg CO:  [3.9 L/min-5 L/min] 4.9 L/min CI:  [1.7 L/min/m2-87 L/min/m2] 2.2 L/min/m2  Intake/Output from previous day: 12/03 0701 - 12/04 0700 In: 6192.4 [I.V.:4065.4; Blood:252; IV Piggyback:1700] Out: 4330 [Urine:3180; Emesis/NG output:90; Blood:800; Chest Tube:260] Intake/Output this shift:    General appearance: alert and no distress Neurologic: intact Heart: regular rate and rhythm and faint rub Lungs: bronchophony bilaterally Abdomen: normal findings: soft, non-tender  Lab Results:  Recent Labs  01/05/13 1953 01/06/13 0425  WBC 15.4* 13.1*  HGB 10.2* 10.1*  HCT 30.0* 29.7*  PLT 164 137*   BMET:  Recent Labs  01/05/13 0544  01/05/13 1951 01/05/13 1953 01/06/13 0425  NA 140  < > 139  --  136  K 3.8  < > 4.4  --  4.3  CL 104  --  107  --  106  CO2 26  --   --   --  20  GLUCOSE 168*  < > 119*  --  95  BUN 27*  --  19  --  20  CREATININE 1.35  --  1.30 1.04 1.08  CALCIUM 8.8  --   --   --  7.5*  < > = values in this interval not displayed.  PT/INR:  Recent Labs  01/05/13 1455  LABPROT 16.8*  INR 1.40   ABG    Component Value Date/Time   PHART 7.445 01/06/2013 0122   HCO3 23.8 01/06/2013  0122   TCO2 25 01/06/2013 0122   ACIDBASEDEF 1.0 01/06/2013 0030   O2SAT 98.0 01/06/2013 0122   CBG (last 3)   Recent Labs  01/05/13 2252 01/05/13 2354 01/06/13 0057  GLUCAP 126* 106* 101*    Assessment/Plan: S/P Procedure(s) (LRB): CORONARY ARTERY BYPASS GRAFTING (CABG) (N/A) INTRAOPERATIVE TRANSESOPHAGEAL ECHOCARDIOGRAM (N/A) POD # 1 CABG x 4 CV- stable, good CO, SR  Dc swan  Lopressor, ASA  ECG shows early repol v pericarditis  RESP- some bibasilar atelectasis   pulmonary hygiene, mucinex, albuterol  RENAL- lytes, creatinine OK  10 pounds up- diurese  ENDO- CBG well controlled, transition to levimir  Anemia secondary to ABL- mild, follow  DC CT  OOB, ambulate  SCD + enoxaparin for DVT prophylaxis    LOS: 5 days    [name] [name] C 01/06/2013

## 2013-01-06 NOTE — Progress Notes (Signed)
Pt ABG one hour after extubation below.  Pt place on 50% venti mask.  Will draw another ABG in 1 hour.  WIll continue to monitor.  Results for QUINTO, TIPPY (MRN [id number]) as of 01/06/2013 08:04  Ref. Range 01/06/2013 00:30  Sample type No range found ARTERIAL  pH, Arterial Latest Range: 7.350-7.450  7.421  pCO2 arterial Latest Range: 35.0-45.0 mmHg 35.8  pO2, Arterial Latest Range: 80.0-100.0 mmHg 55.0 (L)  Bicarbonate Latest Range: 20.0-24.0 mEq/L 23.1  TCO2 Latest Range: 0-100 mmol/L 24  Acid-base deficit Latest Range: 0.0-2.0 mmol/L 1.0  O2 Saturation No range found 88.0  Patient temperature No range found 37.9 C  Collection site No range found ARTERIAL LINE

## 2013-01-06 NOTE — Progress Notes (Signed)
Attempted to wean pt to extubate.  Pt did not pass RT parameters.  Will rest pt back on vent and attempt at a later time.  Will continue to monitor.

## 2013-01-06 NOTE — Progress Notes (Signed)
Pt repeat gas below.  Will leave pt on 50% venti and continue to monitor.  Results for NYZIR, DUBOIS (MRN [id number]) as of 01/06/2013 08:04  Ref. Range 01/06/2013 01:22  Sample type No range found ARTERIAL  pH, Arterial Latest Range: 7.350-7.450  7.445  pCO2 arterial Latest Range: 35.0-45.0 mmHg 35.0  pO2, Arterial Latest Range: 80.0-100.0 mmHg 99.0  Bicarbonate Latest Range: 20.0-24.0 mEq/L 23.8  TCO2 Latest Range: 0-100 mmol/L 25  O2 Saturation No range found 98.0  Patient temperature No range found 37.9 C  Collection site No range found ARTERIAL LINE

## 2013-01-07 ENCOUNTER — Encounter (HOSPITAL_COMMUNITY): Payer: Self-pay | Admitting: Thoracic Surgery (Cardiothoracic Vascular Surgery)

## 2013-01-07 ENCOUNTER — Inpatient Hospital Stay (HOSPITAL_COMMUNITY): Payer: Medicare Other

## 2013-01-07 LAB — GLUCOSE, CAPILLARY
Glucose-Capillary: 103 mg/dL — ABNORMAL HIGH (ref 70–99)
Glucose-Capillary: 122 mg/dL — ABNORMAL HIGH (ref 70–99)
Glucose-Capillary: 126 mg/dL — ABNORMAL HIGH (ref 70–99)
Glucose-Capillary: 141 mg/dL — ABNORMAL HIGH (ref 70–99)
Glucose-Capillary: 159 mg/dL — ABNORMAL HIGH (ref 70–99)
Glucose-Capillary: 74 mg/dL (ref 70–99)
Glucose-Capillary: 80 mg/dL (ref 70–99)
Glucose-Capillary: 90 mg/dL (ref 70–99)
Glucose-Capillary: 91 mg/dL (ref 70–99)
Glucose-Capillary: 92 mg/dL (ref 70–99)

## 2013-01-07 LAB — BASIC METABOLIC PANEL
BUN: 27 mg/dL — ABNORMAL HIGH (ref 6–23)
CO2: 21 mEq/L (ref 19–32)
Chloride: 104 mEq/L (ref 96–112)
Glucose, Bld: 101 mg/dL — ABNORMAL HIGH (ref 70–99)
Potassium: 3.8 mEq/L (ref 3.5–5.1)
Sodium: 136 mEq/L (ref 135–145)

## 2013-01-07 LAB — CBC
HCT: 28.8 % — ABNORMAL LOW (ref 39.0–52.0)
Hemoglobin: 9.6 g/dL — ABNORMAL LOW (ref 13.0–17.0)
MCH: 31 pg (ref 26.0–34.0)
MCHC: 33.3 g/dL (ref 30.0–36.0)
MCV: 92.9 fL (ref 78.0–100.0)
Platelets: 132 10*3/uL — ABNORMAL LOW (ref 150–400)
RBC: 3.1 MIL/uL — ABNORMAL LOW (ref 4.22–5.81)

## 2013-01-07 MED ORDER — METOPROLOL TARTRATE 25 MG/10 ML ORAL SUSPENSION
25.0000 mg | Freq: Two times a day (BID) | ORAL | Status: DC
  Filled 2013-01-07 (×4): qty 10

## 2013-01-07 MED ORDER — POTASSIUM CHLORIDE CRYS ER 20 MEQ PO TBCR
20.0000 meq | EXTENDED_RELEASE_TABLET | Freq: Once | ORAL | Status: AC
  Administered 2013-01-07: 20 meq via ORAL
  Filled 2013-01-07: qty 1

## 2013-01-07 MED ORDER — METOPROLOL TARTRATE 25 MG PO TABS: 25.0000 mg | ORAL_TABLET | Freq: Two times a day (BID) | ORAL | Status: DC

## 2013-01-07 MED ORDER — TRAMADOL HCL 50 MG PO TABS
50.0000 mg | ORAL_TABLET | Freq: Four times a day (QID) | ORAL | Status: DC | PRN
  Administered 2013-01-07: 100 mg via ORAL
  Administered 2013-01-08: 50 mg via ORAL
  Filled 2013-01-07: qty 2
  Filled 2013-01-07: qty 1

## 2013-01-07 MED ORDER — METOPROLOL TARTRATE 25 MG/10 ML ORAL SUSPENSION: 12.5000 mg | Freq: Two times a day (BID) | ORAL | Status: DC

## 2013-01-07 MED ORDER — ALBUTEROL SULFATE (5 MG/ML) 0.5% IN NEBU: 2.5000 mg | INHALATION_SOLUTION | Freq: Four times a day (QID) | RESPIRATORY_TRACT | Status: DC | PRN

## 2013-01-07 MED ORDER — INSULIN ASPART 100 UNIT/ML ~~LOC~~ SOLN
0.0000 [IU] | Freq: Three times a day (TID) | SUBCUTANEOUS | Status: DC
  Administered 2013-01-07 – 2013-01-09 (×3): 2 [IU] via SUBCUTANEOUS
  Administered 2013-01-10: 3 [IU] via SUBCUTANEOUS

## 2013-01-07 MED ORDER — METOPROLOL TARTRATE 25 MG PO TABS
25.0000 mg | ORAL_TABLET | Freq: Two times a day (BID) | ORAL | Status: DC
  Administered 2013-01-07 (×2): 25 mg via ORAL
  Filled 2013-01-07 (×4): qty 1

## 2013-01-07 MED ORDER — FUROSEMIDE 40 MG PO TABS
40.0000 mg | ORAL_TABLET | Freq: Once | ORAL | Status: AC
  Administered 2013-01-07: 40 mg via ORAL
  Filled 2013-01-07: qty 1

## 2013-01-07 MED ORDER — INSULIN DETEMIR 100 UNIT/ML ~~LOC~~ SOLN
20.0000 [IU] | Freq: Two times a day (BID) | SUBCUTANEOUS | Status: DC
  Administered 2013-01-07 – 2013-01-09 (×6): 20 [IU] via SUBCUTANEOUS
  Filled 2013-01-07 (×9): qty 0.2

## 2013-01-07 MED ORDER — INSULIN ASPART 100 UNIT/ML ~~LOC~~ SOLN: 0.0000 [IU] | SUBCUTANEOUS | Status: DC

## 2013-01-07 MED FILL — Lidocaine HCl IV Inj 20 MG/ML: INTRAVENOUS | Qty: 5 | Status: AC

## 2013-01-07 MED FILL — Potassium Chloride Inj 2 mEq/ML: INTRAVENOUS | Qty: 40 | Status: AC

## 2013-01-07 MED FILL — Dexmedetomidine HCl IV Soln 200 MCG/2ML: INTRAVENOUS | Qty: 2 | Status: AC

## 2013-01-07 MED FILL — Sodium Chloride IV Soln 0.9%: INTRAVENOUS | Qty: 1000 | Status: AC

## 2013-01-07 MED FILL — Mannitol IV Soln 20%: INTRAVENOUS | Qty: 500 | Status: AC

## 2013-01-07 MED FILL — Heparin Sodium (Porcine) Inj 1000 Unit/ML: INTRAMUSCULAR | Qty: 30 | Status: AC

## 2013-01-07 MED FILL — Electrolyte-R (PH 7.4) Solution: INTRAVENOUS | Qty: 6000 | Status: AC

## 2013-01-07 MED FILL — Magnesium Sulfate Inj 50%: INTRAMUSCULAR | Qty: 10 | Status: AC

## 2013-01-07 MED FILL — Heparin Sodium (Porcine) Inj 1000 Unit/ML: INTRAMUSCULAR | Qty: 60 | Status: AC

## 2013-01-07 MED FILL — Sodium Bicarbonate IV Soln 8.4%: INTRAVENOUS | Qty: 50 | Status: AC

## 2013-01-07 NOTE — Progress Notes (Signed)
Foley inserted per MDs order. Pt was given the opportunity to void prior to insertion, voided 100cc brown urine. Pt still felt like he had to void, bladder scan was done to show 150cc of urine present. Foley inserted with no resistance met. Urine return---100cc. Urine is brown with blood clots present. Will cont. To monitor and assess patient

## 2013-01-07 NOTE — Progress Notes (Signed)
Patient found standing at bedside with his sleeve and foley removed.  Pressure was held at sleeve insertion site, and a Vaseline gauze and pressure dressing was applied.  Patient was then helped back to bed and re-oriented.  He verbalizes understanding that he needs to call for assistance, bed alarms are on.

## 2013-01-07 NOTE — Progress Notes (Signed)
PM ROUNDS  Resting comfortably  Foley replaced for difficulty voiding  BP 119/66  Pulse 103  Temp(Src) 98.3 F (36.8 C) (Oral)  Resp 18  Ht 5\' 9"  (1.753 m)  Wt 256 lb 13.4 oz (116.5 kg)  BMI 37.91 kg/m2  SpO2 99%   Intake/Output Summary (Last 24 hours) at 01/07/13 1728 Last data filed at 01/07/13 1700  Gross per 24 hour  Intake 1217.1 ml  Output    380 ml  Net  837.1 ml   UOP currently ~ 50/ hr  Confusion has been better today

## 2013-01-07 NOTE — Progress Notes (Addendum)
[address]       [place] [address]             [phone]        2 Days Post-Op Procedure(s) (LRB): CORONARY ARTERY BYPASS GRAFTING (CABG) (N/A) INTRAOPERATIVE TRANSESOPHAGEAL ECHOCARDIOGRAM (N/A)  Subjective: Patient became confused last evening. He pulled out foley, sleeve, and atrial pacing wires. This morning, he is oriented to place. He states the year is 67, however.  Objective: Vital signs in last 24 hours: Temp:  [98.1 F (36.7 C)-100 F (37.8 C)] 98.6 F (37 C) (12/05 0400) Pulse Rate:  [43-114] 114 (12/05 0700) Cardiac Rhythm:  [-] Sinus tachycardia (12/05 0721) Resp:  [20-34] 22 (12/05 0700) BP: (95-150)/(53-95) 117/60 mmHg (12/05 0700) SpO2:  [94 %-100 %] 98 % (12/05 0700) Arterial Line BP: (131-161)/(54-62) 149/58 mmHg (12/04 0930) FiO2 (%):  [50 %] 50 % (12/04 0945) Weight:  [116.5 kg (256 lb 13.4 oz)] 116.5 kg (256 lb 13.4 oz) (12/05 0500)  Pre op weight 112 kg Current Weight  01/07/13 116.5 kg (256 lb 13.4 oz)    Hemodynamic parameters for last 24 hours: PAP: (28-41)/(6-14) 32/7 mmHg CO:  [5.1 L/min] 5.1 L/min CI:  [2.4 L/min/m2] 2.4 L/min/m2  Intake/Output from previous day: 12/04 0701 - 12/05 0700 In: 1516.1 [P.O.:800; I.V.:566.1; IV Piggyback:150] Out: 1830 [Urine:1780; Chest Tube:50]   Physical Exam:  Cardiovascular: RRR, no murmurs, gallops, or rubs. Pulmonary: Diminished at bases; no rales, wheezes, or rhonchi. Abdomen: Soft, non tender, some distention,bowel sounds present. Extremities: Mild bilateral lower extremity edema. Wounds: Clean and dry.  No erythema or signs of infection. Neuologic: Grossly intact without focal deficits  Lab Results: CBC: Recent Labs  01/06/13 1700 01/06/13 1716 01/07/13 0352  WBC 17.0*  --  20.7*  HGB 10.3* 10.2* 9.6*  HCT 30.7* 30.0* 28.8*  PLT 146*  --  132*   BMET:  Recent Labs  01/06/13 0425  01/06/13 1716 01/07/13 0352  NA 136  --  138 136  K 4.3  --  4.0  3.8  CL 106  --  102 104  CO2 20  --   --  21  GLUCOSE 95  --  142* 101*  BUN 20  --  23 27*  CREATININE 1.08  < > 1.50* 1.45*  CALCIUM 7.5*  --   --  7.7*  < > = values in this interval not displayed.  PT/INR:  Lab Results  Component Value Date   INR 1.40 01/05/2013   INR 1.08 01/04/2013   INR 1.05 11/30/2012   ABG:  INR: Will add last result for INR, ABG once components are confirmed Will add last 4 CBG results once components are confirmed  Assessment/Plan:  1. CV - ST. On Lopressor 12.5 bid. Will increase to 25 bid. 2.  Pulmonary - CXR this am appears to show no pneumothorax, low lung volumes, bibasilar atelectasis, small bilateral pleural effusions. Encourage incentive spirometer. 3. Volume Overload - Will give Lasix 40 today 4.  Acute blood loss anemia - H and H 9.6 and 28.8 5. Creatinine slightly decreased from 1.5 to 1.45 6. Hypocalcemia 7. Mild thrombocytopenia - platelets 132,000 8.DM-CBGs 91/96/80. Pre op HGA1C 6.9. On Insulin. Was on Metfromin pre op. Will start when creatinine decreases. 9. Regarding confusion, likely related to anesthesia, stay in ICU. Will stop Oxycodone.  Monitor.  ZIMMERMAN,DONIELLE MPA-C 01/07/2013,7:48 AM  Patient seen and examined, agree with above. Confused last night and pulled out medical equipment  He is calm this AM, oriented to person and place, reorients to time easily Will give voiding trial this AM, if unable will replace foley

## 2013-01-08 ENCOUNTER — Inpatient Hospital Stay (HOSPITAL_COMMUNITY): Payer: Medicare Other

## 2013-01-08 LAB — BASIC METABOLIC PANEL
BUN: 28 mg/dL — ABNORMAL HIGH (ref 6–23)
Chloride: 101 mEq/L (ref 96–112)
Creatinine, Ser: 1.42 mg/dL — ABNORMAL HIGH (ref 0.50–1.35)
GFR calc Af Amer: 53 mL/min — ABNORMAL LOW (ref >90)
GFR calc non Af Amer: 45 mL/min — ABNORMAL LOW (ref >90)
Glucose, Bld: 109 mg/dL — ABNORMAL HIGH (ref 70–99)
Sodium: 134 mEq/L — ABNORMAL LOW (ref 135–145)

## 2013-01-08 LAB — GLUCOSE, CAPILLARY: Glucose-Capillary: 88 mg/dL (ref 70–99)

## 2013-01-08 MED ORDER — DIPHENHYDRAMINE HCL 25 MG PO CAPS: 25.0000 mg | ORAL_CAPSULE | Freq: Every evening | ORAL | Status: DC | PRN

## 2013-01-08 MED ORDER — ALUM & MAG HYDROXIDE-SIMETH 200-200-20 MG/5ML PO SUSP: 15.0000 mL | ORAL | Status: DC | PRN

## 2013-01-08 MED ORDER — SODIUM CHLORIDE 0.9 % IJ SOLN: 3.0000 mL | INTRAMUSCULAR | Status: DC | PRN

## 2013-01-08 MED ORDER — ZOLPIDEM TARTRATE 5 MG PO TABS: 5.0000 mg | ORAL_TABLET | Freq: Every evening | ORAL | Status: DC | PRN

## 2013-01-08 MED ORDER — METOPROLOL TARTRATE 25 MG PO TABS
25.0000 mg | ORAL_TABLET | Freq: Three times a day (TID) | ORAL | Status: DC
  Administered 2013-01-08 – 2013-01-10 (×7): 25 mg via ORAL
  Filled 2013-01-08 (×9): qty 1

## 2013-01-08 MED ORDER — MOVING RIGHT ALONG BOOK
Freq: Once | Status: AC
  Administered 2013-01-08: 12:00:00
  Filled 2013-01-08: qty 1

## 2013-01-08 MED ORDER — METOPROLOL TARTRATE 25 MG/10 ML ORAL SUSPENSION
25.0000 mg | Freq: Three times a day (TID) | ORAL | Status: DC
  Filled 2013-01-08 (×9): qty 10

## 2013-01-08 MED ORDER — MAGNESIUM HYDROXIDE 400 MG/5ML PO SUSP: 30.0000 mL | Freq: Every day | ORAL | Status: DC | PRN

## 2013-01-08 MED ORDER — FUROSEMIDE 40 MG PO TABS
40.0000 mg | ORAL_TABLET | Freq: Every day | ORAL | Status: DC
  Administered 2013-01-08 – 2013-01-10 (×3): 40 mg via ORAL
  Filled 2013-01-08 (×3): qty 1

## 2013-01-08 MED ORDER — SODIUM CHLORIDE 0.9 % IJ SOLN
3.0000 mL | Freq: Two times a day (BID) | INTRAMUSCULAR | Status: DC
  Administered 2013-01-08 – 2013-01-10 (×3): 3 mL via INTRAVENOUS

## 2013-01-08 MED ORDER — SODIUM CHLORIDE 0.9 % IV SOLN: 250.0000 mL | INTRAVENOUS | Status: DC | PRN

## 2013-01-08 NOTE — Progress Notes (Signed)
3 Days Post-Op Procedure(s) (LRB): CORONARY ARTERY BYPASS GRAFTING (CABG) (N/A) INTRAOPERATIVE TRANSESOPHAGEAL ECHOCARDIOGRAM (N/A) Subjective: Confusion much improved He did try to get up on his own a couple of times but remained oriented  Objective: Vital signs in last 24 hours: Temp:  [98.3 F (36.8 [name])-100.1 F (37.8 [name])] 100.1 F (37.8 [name]) (12/06 0756) Pulse Rate:  [92-114] 110 (12/06 0800) Cardiac Rhythm:  [-] Sinus tachycardia (12/06 0800) Resp:  [18-37] 22 (12/06 0800) BP: (109-152)/(60-85) 134/76 mmHg (12/06 0800) SpO2:  [90 %-99 %] 92 % (12/06 0800) Weight:  [255 lb 11.7 oz (116 kg)] 255 lb 11.7 oz (116 kg) (12/06 0500)  Hemodynamic parameters for last 24 hours:    Intake/Output from previous day: 12/05 0701 - 12/06 0700 In: 2010 [P.O.:600; I.V.:110; IV Piggyback:50] Out: 100 [Urine:100] Intake/Output this shift: Total I/O In: 200 [P.O.:200] Out: -   General appearance: alert and no distress Neurologic: intact Heart: tachy, regular Lungs: diminished breath sounds bibasilar Abdomen: normal findings: soft, non-tender  Lab Results:  Recent Labs  01/06/13 1700 01/06/13 1716 01/07/13 0352  WBC 17.0*  --  20.7*  HGB 10.3* 10.2* 9.6*  HCT 30.7* 30.0* 28.8*  PLT 146*  --  132*   BMET:  Recent Labs  01/07/13 0352 01/08/13 0345  NA 136 134*  K 3.8 3.9  CL 104 101  CO2 21 23  GLUCOSE 101* 109*  BUN 27* 28*  CREATININE 1.45* 1.42*  CALCIUM 7.7* 8.2*    PT/INR:  Recent Labs  01/05/13 1455  LABPROT 16.8*  INR 1.40   ABG    Component Value Date/Time   PHART 7.445 01/06/2013 0122   HCO3 23.8 01/06/2013 0122   TCO2 21 01/06/2013 1716   ACIDBASEDEF 1.0 01/06/2013 0030   O2SAT 98.0 01/06/2013 0122   CBG (last 3)   Recent Labs  01/07/13 1652 01/07/13 2147 01/08/13 0758  GLUCAP 159* 126* 88    Assessment/Plan: S/P Procedure(s) (LRB): CORONARY ARTERY BYPASS GRAFTING (CABG) (N/A) INTRAOPERATIVE TRANSESOPHAGEAL ECHOCARDIOGRAM (N/A) Plan for  transfer to step-down: see transfer orders Neuro- delirium/ confusion better, no focal deficits  CV- mildly tachy and hypertensive- will increase lopressor  RESP- pulmonary hygiene  RENAL- creatinine stable, urine clear- PO lasix  Keep foley one more day and do voiding trial tomorrow  ENDO- DM, CBG well controlled, contiue levemir/ SSI, restart metformin tomorrow if creatinine OK  Anemia secondary to ABL- mild, follow  LOS: 7 days    [name] [name] 01/08/2013

## 2013-01-09 ENCOUNTER — Inpatient Hospital Stay (HOSPITAL_COMMUNITY): Payer: Medicare Other

## 2013-01-09 LAB — GLUCOSE, CAPILLARY
Glucose-Capillary: 132 mg/dL — ABNORMAL HIGH (ref 70–99)
Glucose-Capillary: 111 mg/dL — ABNORMAL HIGH (ref 70–99)

## 2013-01-09 LAB — BASIC METABOLIC PANEL
CO2: 24 mEq/L (ref 19–32)
Calcium: 8 mg/dL — ABNORMAL LOW (ref 8.4–10.5)
Creatinine, Ser: 1.52 mg/dL — ABNORMAL HIGH (ref 0.50–1.35)
GFR calc Af Amer: 48 mL/min — ABNORMAL LOW (ref >90)
Glucose, Bld: 78 mg/dL (ref 70–99)
Potassium: 3.6 mEq/L (ref 3.5–5.1)

## 2013-01-09 LAB — CBC
Hemoglobin: 8.4 g/dL — ABNORMAL LOW (ref 13.0–17.0)
MCH: 31 pg (ref 26.0–34.0)
MCV: 93.7 fL (ref 78.0–100.0)
RBC: 2.71 MIL/uL — ABNORMAL LOW (ref 4.22–5.81)

## 2013-01-09 MED ORDER — LACTULOSE 10 GM/15ML PO SOLN
20.0000 g | Freq: Every day | ORAL | Status: DC | PRN
  Filled 2013-01-09: qty 30

## 2013-01-09 NOTE — Progress Notes (Addendum)
       [address]       [place] [address]             [phone]          4 Days Post-Op Procedure(s) (LRB): CORONARY ARTERY BYPASS GRAFTING (CABG) (N/A) INTRAOPERATIVE TRANSESOPHAGEAL ECHOCARDIOGRAM (N/A)  Subjective: Comfortable, oriented, no complaints.  Walked in halls yesterday, but still not eating well.   Objective: Vital signs in last 24 hours: Patient Vitals for the past 24 hrs:  BP Temp Temp src Pulse Resp SpO2 Weight  01/09/13 0538 - - - - - - 253 lb (114.76 kg)  01/09/13 0449 135/79 mmHg 98.9 F (37.2 C) Oral 103 19 94 % -  01/08/13 1958 156/84 mmHg 98.7 F (37.1 C) Oral 106 20 94 % -  01/08/13 1613 112/57 mmHg - - 105 - - -  01/08/13 1300 100/63 mmHg 98.9 F (37.2 C) Oral 105 21 94 % -  01/08/13 1026 - - - - - 93 % -  01/08/13 1007 125/74 mmHg 100.3 F (37.9 C) Oral 106 22 93 % -  01/08/13 0900 114/73 mmHg - - 111 21 95 % -   Current Weight  01/09/13 253 lb (114.76 kg)  PRE-OPERATIVE WEIGHT: 112 kg    Intake/Output from previous day: 12/06 0701 - 12/07 0700 In: 1110 [P.O.:960] Out: 550 [Urine:550]  CBGs 100-85-78-80    PHYSICAL EXAM:  Heart: RRR, mildly tachy around 100 Lungs: Clear, slightly decreased BS in bases Wound: Clean and dry Extremities: Trace LE edema    Lab Results: CBC: Recent Labs  01/07/13 0352 01/09/13 0502  WBC 20.7* 14.7*  HGB 9.6* 8.4*  HCT 28.8* 25.4*  PLT 132* 202   BMET:  Recent Labs  01/08/13 0345 01/09/13 0502  NA 134* 138  K 3.9 3.6  CL 101 103  CO2 23 24  GLUCOSE 109* 78  BUN 28* 28*  CREATININE 1.42* 1.52*  CALCIUM 8.2* 8.0*    PT/INR: No results found for this basename: LABPROT, INR,  in the last 72 hours    Assessment/Plan: S/P Procedure(s) (LRB): CORONARY ARTERY BYPASS GRAFTING (CABG) (N/A) INTRAOPERATIVE TRANSESOPHAGEAL ECHOCARDIOGRAM (N/A)  CV- BPs stable, mildly tachy. Continue beta blocker.  DM- sugars stable, continue Levemir, Novolog, SSI.  Metformin  held due to increased Cr.  Will switch to home insulin regimen once eating better.  Renal insufficiency- Cr up slightly this am.  Will monitor.  Urinary retention- will attempt voiding trial this am. Flomax restarted.  Expected postop blood loss anemia- H/H down slightly this am.  Monitor.  Leukocytosis- WBC trending down, afebrile.  Post-op delirium- Neuro stable. Oriented this am.  Continue to monitor.  Vol overload- continue diuresis.  CRPI, pulm toilet.   LOS: 8 days    COLLINS,GINA H 01/09/2013  Patient seen and examined, agree with above

## 2013-01-09 NOTE — Progress Notes (Signed)
Pt ambulated in hall using rolling walker 200 ft.  Mild increase in work of breathing.  HR up to 120 during ambulation. Back to bed with recovery of HR back to 102.

## 2013-01-10 LAB — BASIC METABOLIC PANEL
CO2: 24 mEq/L (ref 19–32)
Calcium: 8.6 mg/dL (ref 8.4–10.5)
Creatinine, Ser: 1.29 mg/dL (ref 0.50–1.35)
GFR calc Af Amer: 59 mL/min — ABNORMAL LOW (ref >90)
Potassium: 3.5 mEq/L (ref 3.5–5.1)
Sodium: 139 mEq/L (ref 135–145)

## 2013-01-10 LAB — GLUCOSE, CAPILLARY: Glucose-Capillary: 65 mg/dL — ABNORMAL LOW (ref 70–99)

## 2013-01-10 LAB — CBC
MCV: 93.6 fL (ref 78.0–100.0)
Platelets: 258 10*3/uL (ref 150–400)
RDW: 14.6 % (ref 11.5–15.5)
WBC: 10.7 10*3/uL — ABNORMAL HIGH (ref 4.0–10.5)

## 2013-01-10 MED ORDER — INSULIN DETEMIR 100 UNIT/ML ~~LOC~~ SOLN
15.0000 [IU] | Freq: Two times a day (BID) | SUBCUTANEOUS | Status: DC
  Administered 2013-01-10: 15 [IU] via SUBCUTANEOUS
  Filled 2013-01-10 (×2): qty 0.15

## 2013-01-10 MED ORDER — METFORMIN HCL 500 MG PO TABS: 500.0000 mg | ORAL_TABLET | Freq: Two times a day (BID) | ORAL | Status: DC

## 2013-01-10 MED ORDER — ASPIRIN 325 MG PO TBEC: 325.0000 mg | DELAYED_RELEASE_TABLET | Freq: Every day | ORAL | Status: DC

## 2013-01-10 MED ORDER — METOPROLOL TARTRATE 25 MG PO TABS: 25.0000 mg | ORAL_TABLET | Freq: Three times a day (TID) | ORAL | Status: DC

## 2013-01-10 MED ORDER — TRAMADOL HCL 50 MG PO TABS: 50.0000 mg | ORAL_TABLET | Freq: Four times a day (QID) | ORAL | Status: DC | PRN

## 2013-01-10 MED ORDER — POTASSIUM CHLORIDE ER 20 MEQ PO TBCR: 20.0000 meq | EXTENDED_RELEASE_TABLET | Freq: Every day | ORAL | Status: DC

## 2013-01-10 MED ORDER — FUROSEMIDE 40 MG PO TABS: 40.0000 mg | ORAL_TABLET | Freq: Every day | ORAL | Status: DC

## 2013-01-10 NOTE — Discharge Summary (Signed)
Physician Discharge Summary       [address] [place]       [place] [address]             [phone]    Patient ID: [name] [name] MRN: [id number] DOB/AGE: Aug 15, 1931 79 y.o.  Admit date: 01/01/2013 Discharge date: 01/10/2013  Admission Diagnoses: 1. NSTEMI 2. History of CAD (s/p MI,PCI with stent 10') 3. History of DM 4.History of hypertension 5. History of PVD 6. History of dyslipidemia 7. History of remote tobacco abuse 8. History of GERD 9. History of prostate cancer  Discharge Diagnoses:  1. NSTEMI 2. History of CAD (s/p MI,PCI with stent 10') 3. History of DM 4.History of hypertension 5. History of PVD 6. History of dyslipidemia 7. History of remote tobacco abuse 8. History of GERD 9. History of prostate cancer 10. ABL anemia  Procedure (s):  1. Cardiac catheterization done by Dr. Allyson Sabal on 01/03/2013: ANGIOGRAPHIC RESULTS:  1. Left main; normal  2. LAD; 80-90% proximal, 50% segmental mid, 95% apical  3. Left circumflex; occluded stent in proximal OM1. The AV groove circumflex was occluded after the takeoff of the obtuse marginal branch until bite left to left collaterals..  4. Right coronary artery; codominant with 75-80% fairly focal mid. The PDA was a small vessel  5. Left ventriculography; RAO left ventriculogram was performed using  25 mL of Visipaque dye at 12 mL/second. The overall LVEF estimated  50 % With wall motion abnormalities notable for mild to moderate posterolateral hypokinesia  2.Median sternotomy, extracorporeal circulation, coronary  artery bypass grafting x4 (left internal mammary artery to left anterior descending, saphenous vein graft to obtuse marginal 1, sequential saphenous vein graft to acute marginal and posterior descending), endoscopic vein harvest, both thighs by Dr. Dorris Fetch on 01/05/2013.  History of Presenting Illness: This is a 79 yo man with a history of CAD, hypertension, hyperlipidemia and type II  diabetes. He was previously treated with a DES to an OM in 2007. In 2010 he had recurrent angina and was found to have occluded the stent. That vessel was redilated.  He had been doing well from a cardiac standpoint until recently, but had been evaluated for severe claudication in November. He had a nuclear study done in October, which revealed a large, mostly fixed defect with possible periinfarct ischemia. This was in a territory of his previously described MI and angioplasty and was considered intermediate risk. His ejection fraction was 50-55% with lateral wall hypokinesia.  He was admitted on 11/29 after presenting to the ED with a c/o chest pain. He had been having symptoms for 2-3 weeks. He says the pain is severe. It was initially exertional but now occurs with even minimal activities. On the day of admission he had an episode that was severe(10/10) and radiated to his neck and shoulders. He also experienced nausea and shortness of breath. His EKG showed TWI in I and AVL, which were chronic. A CT angio was negative for PE. He developed new ST depression in V2-5. He eventually ruled in with a troponin of 6. He had cardiac catheterization today which revealed occlusion of his DES. He had severe 3 vessel CAD with an EF of 50%. There was an area of lateral akinesis.  He currently is pain free.  His claudication is bilateral and limits him to walking less than 100 feet. Doppler studies as an outpatient suggested high-grade bilateral mid SFA disease. Dr. Allyson Sabal performed abdominal aortography with bifemoral runoff on 12/07/12, revealing two-vessel runoff  below the knee bilaterally, a long 90% segment segmental calcified mid right SFA stenosis and a chronic total occlusion of the mid left SFA which he attempted to cross unsuccessfully.  A cardiothoracic consultation was obtained with Dr. Dorris Fetch for the consideration of coronary artery bypass grafting surgery. Potential risks, complications, and benefits  were discussed with the patient and he agreed to proceed with surgery. Pre operative duplex carotid US showed no significant carotid artery stenosis bilaterally. His ABI's were 0.66 on the right and 0.71 on the left.He underwent a CABG x 4 on 01/05/2013.   Brief Hospital Course:  The patient was extubated late the evening of surgery without difficulty. He remained afebrile and hemodynamically stable. Theone Murdoch, a line, chest tubes, and foley were removed early in the post operative course. Lopressor was started and titrated accordingly. H was volume over loaded and diuresed. He was weaned off the insulin drip. Once he/she was tolerating a diet, home insulin was restarted. Metformin was not restarted post op as his creatinine was elevated (up to 1.52). His last creatinine was down to 1.29. He will be restarted on Metformin after discharge.The patient's HGA1C pre op was  6.9. He did have ABL anemia. His last H and H was up to 8.6 and 26.3. He did not require a post op transfusion.The patient was felt surgically stable for transfer from the ICU to [facility] for further convalescence on 01/09/2013. He continues to progress with cardiac rehab. He/she was ambulating on room air. He has been tolerating a diet and has had a bowel movement. Epicardial pacing wires and chest tube sutures will be removed prior to discharge. He has been seen and evaluated by Dr. Dorris Fetch today. He is surgicially stable for discharge home today.   Latest Vital Signs: Blood pressure 132/63, pulse 90, temperature 98.6 F (37 C), temperature source Oral, resp. rate 18, height 5\' 9"  (1.753 m), weight 112.9 kg (248 lb 14.4 oz), SpO2 92.00%.  Physical Exam: General appearance: alert and no distress  Neurologic: intact  Heart: regular rate and rhythm  Lungs: clear to auscultation bilaterally  Wound: clean and dry   Discharge Condition:Stable  Recent laboratory studies:  Lab Results  Component Value Date   WBC 10.7* 01/10/2013     HGB 8.6* 01/10/2013   HCT 26.3* 01/10/2013   MCV 93.6 01/10/2013   PLT 258 01/10/2013   Lab Results  Component Value Date   NA 139 01/10/2013   K 3.5 01/10/2013   CL 105 01/10/2013   CO2 24 01/10/2013   CREATININE 1.29 01/10/2013   GLUCOSE 51* 01/10/2013      Diagnostic Studies: Dg Chest 2 View  01/09/2013   CLINICAL DATA:  Atelectasis.  Recent CABG.  EXAM: CHEST  2 VIEW  COMPARISON:  01/08/2013  FINDINGS: There is a tiny right effusion and a small left effusion. Minimal atelectasis at the left base has almost resolved.  Heart size and pulmonary vascularity are normal.  No pneumothorax.  IMPRESSION: Small bilateral effusions. Atelectasis at the left base has almost resolved.   Electronically Signed   By: Geanie Cooley M.D.   On: 01/09/2013 08:23   Ct Angio Chest Pe W/cm &/or Wo Cm  01/02/2013   CLINICAL DATA:  Intermittent Mid chest pain and tightness for 1 week. Shortness of breath. Occasional cough.  EXAM: CT ANGIOGRAPHY CHEST WITH CONTRAST  TECHNIQUE: Multidetector CT imaging of the chest was performed using the standard protocol during bolus administration of intravenous contrast. Multiplanar CT image reconstructions including  MIPs were obtained to evaluate the vascular anatomy.  CONTRAST:  80mL OMNIPAQUE IOHEXOL 350 MG/ML SOLN  COMPARISON:  None.  FINDINGS: Technically limited study due to motion artifact. There is moderately good opacification of the central and segmental pulmonary arteries. No focal discrete filling defects are identified. No evidence of significant pulmonary embolus.  The heart size is normal. Normal caliber thoracic aorta. Calcification of the aorta and Coronary arteries. Esophagus appears to be decompressed. No significant lymphadenopathy in the chest. Visualization of the lung fields is limited due to motion artifact but there appears to be interstitial thickening and perihilar airspace disease suggesting interstitial and alveolar edema. Pneumonia could also have this  appearance. No pleural effusions. No pneumothorax. Degenerative changes in the thoracic spine. No destructive bone lesions appreciated.  Review of the MIP images confirms the above findings.  IMPRESSION: Technically limited study due to motion artifact. There is no evidence of significant pulmonary embolus. Diffuse airspace and interstitial changes are suggested possibly representing pulmonary edema.   Electronically Signed   By: Burman Nieves M.D.   On: 01/02/2013 00:12        Discharge Orders   Future Appointments Provider Department Dept Phone   02/01/2013 10:45 AM Loreli Slot, MD [facility] [phone]   Future Orders Complete By Expires   Amb Referral to Cardiac Rehabilitation  As directed       Discharge Medications:   Medication List    STOP taking these medications       lisinopril 20 MG tablet  Commonly known as:  PRINIVIL,ZESTRIL     naproxen sodium 220 MG tablet  Commonly known as:  ANAPROX      TAKE these medications       amLODipine 10 MG tablet  Commonly known as:  NORVASC  Take 10 mg by mouth daily.     aspirin 325 MG EC tablet  Take 1 tablet (325 mg total) by mouth daily.     cilostazol 50 MG tablet  Commonly known as:  PLETAL  Take 50 mg by mouth 2 (two) times daily.     furosemide 40 MG tablet  Commonly known as:  LASIX  Take 1 tablet (40 mg total) by mouth daily. For 4 days then stop.     insulin NPH 100 UNIT/ML injection  Commonly known as:  HUMULIN N,NOVOLIN N  Inject 10 Units into the skin 2 (two) times daily.     insulin regular 100 units/mL injection  Commonly known as:  NOVOLIN R,HUMULIN R  Inject 30 Units into the skin daily.     metFORMIN 500 MG tablet  Commonly known as:  GLUCOPHAGE  Take 1 tablet (500 mg total) by mouth 2 (two) times daily with a meal.  Start taking on:  01/12/2013     metoprolol tartrate 25 MG tablet  Commonly known as:  LOPRESSOR  Take 1 tablet (25 mg  total) by mouth 3 (three) times daily.     multivitamin with minerals Tabs tablet  Take 1 tablet by mouth daily.     Potassium Chloride ER 20 MEQ Tbcr  Take 20 mEq by mouth daily. For 4 days then stop.     pravastatin 40 MG tablet  Commonly known as:  PRAVACHOL  Take 40 mg by mouth every evening.     tamsulosin 0.4 MG Caps capsule  Commonly known as:  FLOMAX  Take 0.4 mg by mouth at bedtime.     traMADol 50 MG tablet  Commonly known as:  ULTRAM  Take 1-2 tablets (50-100 mg total) by mouth every 6 (six) hours as needed for moderate pain or severe pain.       The patient has been discharged on:   1.Beta Blocker:  Yes [  x ]                              No   [   ]                              If No, reason:  2.Ace Inhibitor/ARB: Yes [   ]                                     No  [ x   ]                                     If No, reason: Last creatinine 1.29  3.Statin:   Yes [ x  ]                  No  [   ]                  If No, reason:  4.Ecasa:  Yes  [  x ]                  No   [   ]                  If No, reason:  Follow Up Appointments: Follow-up Information   Follow up with [name], MD. (Call for an appointment for 2 weeks)    Specialty:  Cardiology   Contact information:   [address] [place] [address] [phone]       Follow up with Loreli Slot, MD. (PA/LAT CXR to be taken (at [facility] which is in the same building as Dr. Stevphen Meuse) on 02/01/2013 at 9:45 am;Appointment with Dr. Rosezena Sensor is on 02/01/2013 at 10:45 am)    Specialty:  Cardiothoracic Surgery   Contact information:   [address] [place] [address] [phone]       Signed: Doree Fudge MPA-C 01/10/2013, 10:58 AM

## 2013-01-10 NOTE — Progress Notes (Signed)
EPW d/c at this time; VSS; pt tolerated well; bedrest until 1130; will cont. To monitor.

## 2013-01-10 NOTE — Progress Notes (Signed)
Hypoglycemic Event  CBG:  61  Treatment: 15 GM carbohydrate snack  Symptoms: Hungry  Follow-up CBG: Time:  0650 CBG Result: 103  Possible Reasons for Event: Unknown      [name] [name] D  Remember to initiate Hypoglycemia Order Set & complete

## 2013-01-10 NOTE — Progress Notes (Signed)
Pt and family given d/c instructions; IV's and tele monitor d/c at this time; will cont. To monitor.

## 2013-01-10 NOTE — Progress Notes (Signed)
*  Recently Discharged. The Chaplain offered emotional and spiritual support today to the patient. The Chaplain did not stay to long with the patient because the patient was being discharged by a nurse within the first 5 minutes of the visit. However, the patient did express gratitude to the Chaplain for stopping by even though it was a short visit.  Chaplain Bryson Ha Kanya Potteiger

## 2013-01-10 NOTE — Progress Notes (Addendum)
CARDIAC REHAB PHASE I   PRE:  Rate/Rhythm: 101 ST  BP:  Supine:   Sitting: 130/70  Standing:    SaO2: 95 RA  MODE:  Ambulation: 550 ft   POST:  Rate/Rhythm: 113 ST  BP:  Supine:   Sitting: 134/76  Standing:    SaO2: 96 RA 0950-1050 Assisted  X1 and used walker to ambulate. Gait steady with walker. Pt is DOE and took several standing rest stops during walk. HR after walk 113. RA sats 95-*6%. Pt back to bed after walk to get pacing wire discontinued. Completed discharge education with pt and wife. They voice understanding. Pt agrees to Outpt. CRP in [place], will send referral. Would recommend a walker for home use. Placed recovery from [facility] video for them to watch.  Melina Copa RN 01/10/2013 10:49 AM

## 2013-01-10 NOTE — Progress Notes (Signed)
Chest tube sutures d/c at this time; steri strips applied to sites; no drainage noted; will cont. To monitor.

## 2013-01-10 NOTE — Progress Notes (Signed)
5 Days Post-Op Procedure(s) (LRB): CORONARY ARTERY BYPASS GRAFTING (CABG) (N/A) INTRAOPERATIVE TRANSESOPHAGEAL ECHOCARDIOGRAM (N/A) Subjective: No complaints, wants to go home Was hypoglycemic this morning, did not eat anything before bedtime last night  Objective: Vital signs in last 24 hours: Temp:  [98.6 F (37 C)-99.4 F (37.4 C)] 98.6 F (37 C) (12/08 0431) Pulse Rate:  [96-104] 97 (12/08 0431) Cardiac Rhythm:  [-] Normal sinus rhythm;Sinus tachycardia (12/08 0806) Resp:  [18-19] 18 (12/08 0431) BP: (108-147)/(71-83) 147/83 mmHg (12/08 0431) SpO2:  [92 %-98 %] 92 % (12/08 0431) Weight:  [248 lb 14.4 oz (112.9 kg)] 248 lb 14.4 oz (112.9 kg) (12/08 0700)  Hemodynamic parameters for last 24 hours:    Intake/Output from previous day: 12/07 0701 - 12/08 0700 In: 360 [P.O.:360] Out: 375 [Urine:375] Intake/Output this shift:    General appearance: alert and no distress Neurologic: intact Heart: regular rate and rhythm Lungs: clear to auscultation bilaterally Wound: clean and dry  Lab Results:  Recent Labs  01/09/13 0502 01/10/13 0607  WBC 14.7* 10.7*  HGB 8.4* 8.6*  HCT 25.4* 26.3*  PLT 202 258   BMET:  Recent Labs  01/09/13 0502 01/10/13 0607  NA 138 139  K 3.6 3.5  CL 103 105  CO2 24 24  GLUCOSE 78 51*  BUN 28* 18  CREATININE 1.52* 1.29  CALCIUM 8.0* 8.6    PT/INR: No results found for this basename: LABPROT, INR,  in the last 72 hours ABG    Component Value Date/Time   PHART 7.445 01/06/2013 0122   HCO3 23.8 01/06/2013 0122   TCO2 21 01/06/2013 1716   ACIDBASEDEF 1.0 01/06/2013 0030   O2SAT 98.0 01/06/2013 0122   CBG (last 3)   Recent Labs  01/10/13 0600 01/10/13 0630 01/10/13 0656  GLUCAP 61* 65* 103*    Assessment/Plan: S/P Procedure(s) (LRB): CORONARY ARTERY BYPASS GRAFTING (CABG) (N/A) INTRAOPERATIVE TRANSESOPHAGEAL ECHOCARDIOGRAM (N/A) - Looks good today Will dc EPW levemir reduced DC home later today   LOS: 9 days     [name] [name] C 01/10/2013

## 2013-01-13 NOTE — Op Note (Signed)
NAME:  [name] [name], SHILL NO.:  [id number]  MEDICAL RECORD NO.:  [id number]  LOCATION:                                FACILITY:  [facility]  PHYSICIAN:  Burna Forts, M.D.DATE OF BIRTH:  March 20, 1931  DATE OF PROCEDURE:  01/05/2013 DATE OF DISCHARGE:  01/10/2013                              OPERATIVE REPORT   Intraoperative Transesophageal Echocardiographic Report:  INDICATION FOR PROCEDURE:  Ms. Reppert is a 79 year old gentleman who presents today for coronary artery bypass grafting to be performed by Dr. Charlett Lango.  He is brought to the OR for routine induction of general anesthesia, after which the TEE probe is prepared and passed through oropharyngeal in the stomach for imaging of the cardiac structures.  PRE-CARDIOPULMONARY BYPASS TEE EXAMINATION:  Left Ventricle:  The left ventricular chamber is seen initially in the short axis view.  There is moderate decreased left ventricular function appreciated.  Estimated ejection fraction is 30%.  The short axis view is notable for anterolateral and lateral wall severe hypokinesis.  Anterior and septal wall areas are of normal contractile pattern. Mitral Valve:  The mitral valve is seen initially in the four-chamber view.  The leaflets are mildly thickened both anterior and posterior. There is a normal coaptation point appreciated just below the level of the anulus. Color Doppler:  There is trivial mitral regurgitant flow appreciated. Pulse wave analysis shows a pattern of mildly increased left ventricular end-diastolic pressure. Aortic Valve:  A short axis view shows 3 cusps.  There is aortic sclerosis.  There is some calcium deposition on the left cusp with some mild restriction of function and movement.  Color Doppler reveals no aortic insufficiency nor aortic stenosis appreciated. Right Ventricle:  Normal right ventricular chamber and function. Left and right atrial chambers are normal.  The patient is  placed on cardiopulmonary bypass and coronary artery bypass grafting is carried out.  The patient is rewarmed and separated from the cardiopulmonary bypass with the initial attempt, with the addition of nitrates.  POST-CARDIOPULMONARY BYPASS TEE EXAMINATION:  Left Ventricle:  Attention is given to the left ventricular chamber in the short axis view.  The anterior and anteroseptal areas are vigorous and active and contractile. Overall, the estimated ejection fraction is improved from the prebypass period.  The lateral wall shows hypocontractility as appreciated in the prebypass.  However, overall there appears to be improved contractile pattern in the left ventricular chamber in this short axis view.  The rest of the cardiac examination was as previously described.  The patient was ultimately returned to the cardiac intensive care unit in stable condition.          ______________________________ Burna Forts, M.D.     JTM/MEDQ  D:  01/11/2013  T:  01/12/2013  Job:  [id number]

## 2013-01-14 ENCOUNTER — Encounter (HOSPITAL_COMMUNITY): Payer: Self-pay | Admitting: Emergency Medicine

## 2013-01-14 ENCOUNTER — Emergency Department (HOSPITAL_COMMUNITY)
Admission: EM | Admit: 2013-01-14 | Discharge: 2013-01-14 | Disposition: A | Discharge status: Home / Self Care | Payer: Medicare Other | Attending: Emergency Medicine | Admitting: Emergency Medicine

## 2013-01-14 DIAGNOSIS — M19049 Primary osteoarthritis, unspecified hand: Secondary | ICD-10-CM | POA: Insufficient documentation

## 2013-01-14 DIAGNOSIS — R300 Dysuria: Secondary | ICD-10-CM | POA: Insufficient documentation

## 2013-01-14 DIAGNOSIS — Z951 Presence of aortocoronary bypass graft: Secondary | ICD-10-CM | POA: Insufficient documentation

## 2013-01-14 DIAGNOSIS — I252 Old myocardial infarction: Secondary | ICD-10-CM | POA: Insufficient documentation

## 2013-01-14 DIAGNOSIS — Z87891 Personal history of nicotine dependence: Secondary | ICD-10-CM | POA: Insufficient documentation

## 2013-01-14 DIAGNOSIS — R5381 Other malaise: Secondary | ICD-10-CM | POA: Insufficient documentation

## 2013-01-14 DIAGNOSIS — R319 Hematuria, unspecified: Secondary | ICD-10-CM | POA: Insufficient documentation

## 2013-01-14 DIAGNOSIS — Z794 Long term (current) use of insulin: Secondary | ICD-10-CM | POA: Insufficient documentation

## 2013-01-14 DIAGNOSIS — Z8719 Personal history of other diseases of the digestive system: Secondary | ICD-10-CM | POA: Insufficient documentation

## 2013-01-14 DIAGNOSIS — Z7982 Long term (current) use of aspirin: Secondary | ICD-10-CM | POA: Insufficient documentation

## 2013-01-14 DIAGNOSIS — Z8546 Personal history of malignant neoplasm of prostate: Secondary | ICD-10-CM | POA: Insufficient documentation

## 2013-01-14 DIAGNOSIS — E119 Type 2 diabetes mellitus without complications: Secondary | ICD-10-CM | POA: Insufficient documentation

## 2013-01-14 DIAGNOSIS — I10 Essential (primary) hypertension: Secondary | ICD-10-CM | POA: Insufficient documentation

## 2013-01-14 DIAGNOSIS — Z79899 Other long term (current) drug therapy: Secondary | ICD-10-CM | POA: Insufficient documentation

## 2013-01-14 DIAGNOSIS — Z95818 Presence of other cardiac implants and grafts: Secondary | ICD-10-CM | POA: Insufficient documentation

## 2013-01-14 DIAGNOSIS — E785 Hyperlipidemia, unspecified: Secondary | ICD-10-CM | POA: Insufficient documentation

## 2013-01-14 DIAGNOSIS — I2510 Atherosclerotic heart disease of native coronary artery without angina pectoris: Secondary | ICD-10-CM | POA: Insufficient documentation

## 2013-01-14 LAB — URINE MICROSCOPIC-ADD ON

## 2013-01-14 LAB — POCT I-STAT, CHEM 8
BUN: 18 mg/dL (ref 6–23)
Calcium, Ion: 1.18 mmol/L (ref 1.13–1.30)
Chloride: 101 mEq/L (ref 96–112)
Hemoglobin: 9.9 g/dL — ABNORMAL LOW (ref 13.0–17.0)
TCO2: 27 mmol/L (ref 0–100)

## 2013-01-14 LAB — URINALYSIS, ROUTINE W REFLEX MICROSCOPIC
Glucose, UA: NEGATIVE mg/dL
Ketones, ur: NEGATIVE mg/dL
Nitrite: NEGATIVE
Specific Gravity, Urine: 1.018 (ref 1.005–1.030)
Urobilinogen, UA: 4 mg/dL — ABNORMAL HIGH (ref 0.0–1.0)
pH: 5.5 (ref 5.0–8.0)

## 2013-01-14 LAB — PROTIME-INR: INR: 1.05 (ref 0.00–1.49)

## 2013-01-14 MED ORDER — CIPROFLOXACIN HCL 500 MG PO TABS: 500.0000 mg | ORAL_TABLET | Freq: Two times a day (BID) | ORAL | Status: DC

## 2013-01-14 NOTE — ED Notes (Signed)
Patient urinated in urinal, urine is tea colored in appearance and left in the room.  MD notified.  MD at bedside.

## 2013-01-14 NOTE — Op Note (Signed)
[name] [name], STAIRS NO.:  [id number]  MEDICAL RECORD NO.:  [id number]  LOCATION:                               FACILITY:  [facility]  PHYSICIAN:  Burna Forts, M.D.DATE OF BIRTH:  03/08/1931  DATE OF PROCEDURE:  01/14/2013 DATE OF DISCHARGE:  01/14/2013                              OPERATIVE REPORT   INDICATIONS FOR PROCEDURE:  [name] is a 79 year old gentleman who presents today for coronary artery bypass grafting to be performed by Dr. Charlett Lango.  He was brought to the holding area the morning of surgery, where under local anesthesia with sedation, pulmonary artery and radial arterial lines were placed.  He was then moved to the OR for routine induction of general anesthesia, after which the TEE probe is prepared and passed oropharyngeally into the stomach, then slightly withdrawn for imaging of the cardiac structures.  Precardiopulmonary TEE examination:  Left Ventricle:  The left ventricular chamber is seen initially in the short axis view.  There was moderate depression of the left ventricular function appreciated in the short axis view.  Estimated ejection fraction is approximately 35%.  In the short axis view, there is notable hypokinesis of the anterolateral and severe hypokinesis of the lateral walls.  There was mild hypokinesis noted inferiorly.  Both long and short axis views are obtained.  Overall ventricular thickness is within normal limits.  Mitral Valve:  The mitral valve is seen initially in the four-chamber view.  There are mildly thickened leaflets.  Both anterior and posterior leaflets are well outlined, well visualized.  They appeared to move appropriately during diastolic inflow and collapsed appropriately just below the level of the anulus.  On color Doppler, there is only trivial mitral regurgitant flow appreciated.  Aortic Valve:  The aortic valve is trileaflet.  There was mild aortic sclerosis appreciated.  There  was some calcium appreciated on the left cusp associated with some mild restriction of motion.  However, on color Doppler, there is no aortic insufficiency appreciated nor is there any aortic stenosis appreciated.  Right Ventricle:  Normal chamber in function.  Left and Right Atrium:  Normal chambers and function.  Interatrial septum is interrogated and is intact.  The patient was placed on cardiopulmonary bypass.  Coronary artery bypass grafting is carried out.  The patient is rewarmed and separated from coronary artery bypass with the initial attempt.  Post Cardiopulmonary Bypass TEE examination:  (limited exam)  Left Ventricle:  The left ventricular chamber is seen in the short-axis view in the early post bypass period.  There is good to vigorous anterior and anteroseptal wall contractility appreciated.  Overall, the ventricle appears more contractile and dynamic compared to the prebypass.  However, there does remain some significant hypokinesis in the inferolateral and lateral wall areas.  Multiple views are carried out.  Both long and short axis views are obtained.  The rest of cardiac examination is as previously described without any significant changes in his post bypass.  The patient is returned to the cardiac intensive care unit in stable condition.    ______________________________ Burna Forts, M.D.   ______________________________ Burna Forts, M.D.    JTM/MEDQ  D:  01/13/2013  T:  01/14/2013  Job:  [id number]

## 2013-01-14 NOTE — ED Notes (Signed)
Pt. reports hematuria this evening with slight dysuria , denies fever or chills.

## 2013-01-14 NOTE — ED Notes (Signed)
Pt given a urinal to provide sample.  Family at bedside.
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
## 2013-01-15 LAB — URINE CULTURE

## 2013-01-20 ENCOUNTER — Telehealth: Payer: Self-pay | Admitting: Cardiovascular Disease

## 2013-01-20 NOTE — Telephone Encounter (Signed)
He had open heart surgery on December 1st. When does he need a follow up visit with Dr  Allyson Sabal?

## 2013-01-20 NOTE — Telephone Encounter (Signed)
Pt scheduled to see Dr. Allyson Sabal on 12.30.14.  Appt was scheduled on 12.9.14.  Returned call.  No answer/voicemail.

## 2013-01-21 NOTE — Telephone Encounter (Signed)
Returned call and pt verified x 2.  Pt informed message received and he is scheduled to see Dr. Allyson Sabal on 12.30.14 at 4pm.  Pt wrote down date/time.  Pt also reminded he has an appt w/ Dr. Dorris Fetch the same day at 10:45am.  Pt given phone/address for appt and will call them to confirm.  Pt denied any issues needing to be addressed w/ a sooner appt.

## 2013-01-25 ENCOUNTER — Other Ambulatory Visit: Payer: Self-pay | Admitting: *Deleted

## 2013-01-25 DIAGNOSIS — I2510 Atherosclerotic heart disease of native coronary artery without angina pectoris: Secondary | ICD-10-CM

## 2013-02-01 ENCOUNTER — Ambulatory Visit: Payer: Medicare Other | Admitting: Cardiovascular Disease

## 2013-02-01 ENCOUNTER — Ambulatory Visit (INDEPENDENT_AMBULATORY_CARE_PROVIDER_SITE_OTHER): Payer: Self-pay | Admitting: Thoracic Surgery (Cardiothoracic Vascular Surgery)

## 2013-02-01 ENCOUNTER — Ambulatory Visit
Admission: RE | Admit: 2013-02-01 | Discharge: 2013-02-01 | Disposition: A | Discharge status: Home / Self Care | Payer: Medicare Other | Source: Ambulatory Visit | Attending: Thoracic Surgery (Cardiothoracic Vascular Surgery) | Admitting: Thoracic Surgery (Cardiothoracic Vascular Surgery)

## 2013-02-01 ENCOUNTER — Encounter: Payer: Self-pay | Admitting: Thoracic Surgery (Cardiothoracic Vascular Surgery)

## 2013-02-01 VITALS — BP 129/81 | HR 108 | Resp 20 | Ht 72.0 in | Wt 241.0 lb

## 2013-02-01 DIAGNOSIS — I2510 Atherosclerotic heart disease of native coronary artery without angina pectoris: Secondary | ICD-10-CM

## 2013-02-01 DIAGNOSIS — Z951 Presence of aortocoronary bypass graft: Secondary | ICD-10-CM
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
## 2013-02-04 ENCOUNTER — Encounter (HOSPITAL_COMMUNITY): Payer: Self-pay | Admitting: Emergency Medicine

## 2013-02-04 ENCOUNTER — Emergency Department (HOSPITAL_COMMUNITY)
Admission: EM | Admit: 2013-02-04 | Discharge: 2013-02-04 | Disposition: A | Discharge status: Home / Self Care | Payer: Medicare Other | Attending: Emergency Medicine | Admitting: Emergency Medicine

## 2013-02-04 DIAGNOSIS — Z794 Long term (current) use of insulin: Secondary | ICD-10-CM | POA: Insufficient documentation

## 2013-02-04 DIAGNOSIS — Z8719 Personal history of other diseases of the digestive system: Secondary | ICD-10-CM | POA: Insufficient documentation

## 2013-02-04 DIAGNOSIS — Z87891 Personal history of nicotine dependence: Secondary | ICD-10-CM | POA: Insufficient documentation

## 2013-02-04 DIAGNOSIS — Z7982 Long term (current) use of aspirin: Secondary | ICD-10-CM | POA: Insufficient documentation

## 2013-02-04 DIAGNOSIS — R5381 Other malaise: Secondary | ICD-10-CM | POA: Insufficient documentation

## 2013-02-04 DIAGNOSIS — E785 Hyperlipidemia, unspecified: Secondary | ICD-10-CM | POA: Insufficient documentation

## 2013-02-04 DIAGNOSIS — E119 Type 2 diabetes mellitus without complications: Secondary | ICD-10-CM | POA: Insufficient documentation

## 2013-02-04 DIAGNOSIS — Z79899 Other long term (current) drug therapy: Secondary | ICD-10-CM | POA: Insufficient documentation

## 2013-02-04 DIAGNOSIS — E7800 Pure hypercholesterolemia, unspecified: Secondary | ICD-10-CM | POA: Insufficient documentation

## 2013-02-04 DIAGNOSIS — I10 Essential (primary) hypertension: Secondary | ICD-10-CM | POA: Insufficient documentation

## 2013-02-04 DIAGNOSIS — M19049 Primary osteoarthritis, unspecified hand: Secondary | ICD-10-CM | POA: Insufficient documentation

## 2013-02-04 DIAGNOSIS — Z8546 Personal history of malignant neoplasm of prostate: Secondary | ICD-10-CM | POA: Insufficient documentation

## 2013-02-04 DIAGNOSIS — E162 Hypoglycemia, unspecified: Secondary | ICD-10-CM | POA: Insufficient documentation

## 2013-02-04 DIAGNOSIS — Z9861 Coronary angioplasty status: Secondary | ICD-10-CM | POA: Insufficient documentation

## 2013-02-04 DIAGNOSIS — R5383 Other fatigue: Secondary | ICD-10-CM

## 2013-02-04 DIAGNOSIS — I252 Old myocardial infarction: Secondary | ICD-10-CM | POA: Insufficient documentation

## 2013-02-04 DIAGNOSIS — Z951 Presence of aortocoronary bypass graft: Secondary | ICD-10-CM | POA: Insufficient documentation

## 2013-02-04 LAB — CBC WITH DIFFERENTIAL/PLATELET
BASOS PCT: 0 % (ref 0–1)
Basophils Absolute: 0 10*3/uL (ref 0.0–0.1)
EOS ABS: 0.2 10*3/uL (ref 0.0–0.7)
Eosinophils Relative: 2 % (ref 0–5)
HCT: 38.1 % — ABNORMAL LOW (ref 39.0–52.0)
Hemoglobin: 12.2 g/dL — ABNORMAL LOW (ref 13.0–17.0)
LYMPHS PCT: 21 % (ref 12–46)
Lymphs Abs: 2.5 10*3/uL (ref 0.7–4.0)
MCH: 28.9 pg (ref 26.0–34.0)
MCHC: 32 g/dL (ref 30.0–36.0)
MCV: 90.3 fL (ref 78.0–100.0)
Monocytes Absolute: 0.8 10*3/uL (ref 0.1–1.0)
Monocytes Relative: 7 % (ref 3–12)
Neutro Abs: 8.4 10*3/uL — ABNORMAL HIGH (ref 1.7–7.7)
Neutrophils Relative %: 70 % (ref 43–77)
PLATELETS: 250 10*3/uL (ref 150–400)
RBC: 4.22 MIL/uL (ref 4.22–5.81)
RDW: 15 % (ref 11.5–15.5)
WBC: 12 10*3/uL — AB (ref 4.0–10.5)

## 2013-02-04 LAB — COMPREHENSIVE METABOLIC PANEL
ALT: 10 U/L (ref 0–53)
AST: 15 U/L (ref 0–37)
Albumin: 3.4 g/dL — ABNORMAL LOW (ref 3.5–5.2)
Alkaline Phosphatase: 94 U/L (ref 39–117)
BUN: 16 mg/dL (ref 6–23)
CALCIUM: 9.2 mg/dL (ref 8.4–10.5)
CO2: 24 mEq/L (ref 19–32)
Chloride: 103 mEq/L (ref 96–112)
Creatinine, Ser: 1.01 mg/dL (ref 0.50–1.35)
GFR calc Af Amer: 79 mL/min — ABNORMAL LOW (ref >90)
GFR calc non Af Amer: 69 mL/min — ABNORMAL LOW (ref >90)
Glucose, Bld: 164 mg/dL — ABNORMAL HIGH (ref 70–99)
Potassium: 4.7 mEq/L (ref 3.7–5.3)
SODIUM: 139 meq/L (ref 137–147)
Total Bilirubin: 0.3 mg/dL (ref 0.3–1.2)
Total Protein: 7.7 g/dL (ref 6.0–8.3)

## 2013-02-04 LAB — GLUCOSE, CAPILLARY
Glucose-Capillary: 172 mg/dL — ABNORMAL HIGH (ref 70–99)
Glucose-Capillary: 131 mg/dL — ABNORMAL HIGH (ref 70–99)

## 2013-02-04 MED ORDER — METFORMIN HCL 500 MG PO TABS: 500.0000 mg | ORAL_TABLET | Freq: Two times a day (BID) | ORAL | Status: DC

## 2013-02-04 MED ORDER — INSULIN REGULAR HUMAN 100 UNIT/ML IJ SOLN: 15.0000 [IU] | Freq: Every day | INTRAMUSCULAR | Status: DC

## 2013-02-04 NOTE — Discharge Instructions (Signed)
As discussed, it is important that you follow up as soon as possible with your physician for continued management of your condition.  Please use the provided prescriptions for your new dosing regimen of your anti-hyperglycemic medication.  Please discuss these changes with your physician.  If you develop any new, or concerning changes in your condition, please return to the emergency department immediately.  Hypoglycemia (Low Blood Sugar) Hypoglycemia is when the glucose (sugar) in your blood is too low. Hypoglycemia can happen for many reasons. It can happen to people with or without diabetes. Hypoglycemia can develop quickly and can be a medical emergency.  CAUSES  Having hypoglycemia does not mean that you will develop diabetes. Different causes include:  Missed or delayed meals or not enough carbohydrates eaten.  Medication overdose. This could be by accident or deliberate. If by accident, your medication may need to be adjusted or changed.  Exercise or increased activity without adjustments in carbohydrates or medications.  A nerve disorder that affects body functions like your heart rate, blood pressure and digestion (autonomic neuropathy).  A condition where the stomach muscles do not function properly (gastroparesis). Therefore, medications may not absorb properly.  The inability to recognize the signs of hypoglycemia (hypoglycemic unawareness).  Absorption of insulin  may be altered.  Alcohol consumption.  Pregnancy/menstrual cycles/postpartum. This may be due to hormones.  Certain kinds of tumors. This is very rare. SYMPTOMS   Sweating.  Hunger.  Dizziness.  Blurred vision.  Drowsiness.  Weakness.  Headache.  Rapid heart beat.  Shakiness.  Nervousness. DIAGNOSIS  Diagnosis is made by monitoring blood glucose in one or all of the following ways:  Fingerstick blood glucose monitoring.  Laboratory results. TREATMENT  If you think your blood glucose is  low:  Check your blood glucose, if possible. If it is less than 70 mg/dl, take one of the following:  3-4 glucose tablets.   cup juice (prefer clear like apple).   cup "regular" soda pop.  1 cup milk.  -1 tube of glucose gel.  5-6 hard candies.  Do not over treat because your blood glucose (sugar) will only go too high.  Wait 15 minutes and recheck your blood glucose. If it is still less than 70 mg/dl (or below your target range), repeat treatment.  Eat a snack if it is more than one hour until your next meal. Sometimes, your blood glucose may go so low that you are unable to treat yourself. You may need someone to help you. You may even pass out or be unable to swallow. This may require you to get an injection of glucagon, which raises the blood glucose. HOME CARE INSTRUCTIONS  Check blood glucose as recommended by your caregiver.  Take medication as prescribed by your caregiver.  Follow your meal plan. Do not skip meals. Eat on time.  If you are going to drink alcohol, drink it only with meals.  Check your blood glucose before driving.  Check your blood glucose before and after exercise. If you exercise longer or different than usual, be sure to check blood glucose more frequently.  Always carry treatment with you. Glucose tablets are the easiest to carry.  Always wear medical alert jewelry or carry some form of identification that states that you have diabetes. This will alert people that you have diabetes. If you have hypoglycemia, they will have a better idea on what to do. SEEK MEDICAL CARE IF:   You are having problems keeping your blood sugar at target  range.  You are having frequent episodes of hypoglycemia.  You feel you might be having side effects from your medicines.  You have symptoms of an illness that is not improving after 3-4 days.  You notice a change in vision or a new problem with your vision. SEEK IMMEDIATE MEDICAL CARE IF:   You are a  family member or friend of a person whose blood glucose goes below 70 mg/dl and is accompanied by:  Confusion.  A change in mental status.  The inability to swallow.  Passing out. Document Released: 01/20/2005 Document Revised: 04/14/2011 Document Reviewed: 05/19/2011 [facility] Patient Information 2014 [place], [place].

## 2013-02-04 NOTE — ED Notes (Signed)
Pt. reported hypoglycemic episode this afternoon at home ( CBG= 28 ) received IV glucose by EMS - refused to go to ER at that time , last CBG= 185 at 4:15 pm , pt. states feeling tired this evening , respirations unlabored / alert and oriented .

## 2013-02-04 NOTE — ED Notes (Signed)
Pt A&Ox4, ambulatory at discharge, verbalizes understanding of discharge instructions and medications.
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
## 2013-03-01 ENCOUNTER — Ambulatory Visit (INDEPENDENT_AMBULATORY_CARE_PROVIDER_SITE_OTHER): Payer: Medicare Other | Admitting: Cardiovascular Disease

## 2013-03-01 ENCOUNTER — Encounter: Payer: Self-pay | Admitting: Cardiovascular Disease

## 2013-03-01 VITALS — BP 118/72 | HR 80 | Ht 73.5 in | Wt 245.4 lb

## 2013-03-01 DIAGNOSIS — I2510 Atherosclerotic heart disease of native coronary artery without angina pectoris: Secondary | ICD-10-CM

## 2013-03-01 DIAGNOSIS — I739 Peripheral vascular disease, unspecified: Secondary | ICD-10-CM

## 2013-03-01 DIAGNOSIS — E7800 Pure hypercholesterolemia, unspecified: Secondary | ICD-10-CM

## 2013-03-01 DIAGNOSIS — I5031 Acute diastolic (congestive) heart failure: Secondary | ICD-10-CM

## 2013-03-01 DIAGNOSIS — I509 Heart failure, unspecified: Secondary | ICD-10-CM

## 2013-03-01 DIAGNOSIS — I10 Essential (primary) hypertension: Secondary | ICD-10-CM

## 2013-03-01 NOTE — Assessment & Plan Note (Signed)
The patient was admitted with a non-STEMI 01/03/13 underwent cath the right radial approach revealing three-vessel disease with an EF of 50%. 2 days later he underwent coronary artery bypass grafting x4 by Dr. Merilynn Finland the LIMA to his LAD, vein graft to obtuse marginal branch #1, sequential vein graft to acute marginal and posterior descending branches of the RCA. His postop course was unremarkable. We have talked about cardiac rehabilitation but because of constraints he is unable to participate. He denies chest pain or shortness of breath.

## 2013-03-01 NOTE — Patient Instructions (Signed)
Your physician wants you to follow-up in: 3 months with an extender and 6 months with Dr Berry. You will receive a reminder letter in the mail two months in advance. If you don't receive a letter, please call our office to schedule the follow-up appointment.  

## 2013-03-01 NOTE — Assessment & Plan Note (Signed)
On statin therapy with lipid profile followed by his PCP

## 2013-03-01 NOTE — Progress Notes (Signed)
03/01/2013 [name]   1931-05-19  [id number]  Primary Physician [name], MD Primary Cardiologist: Lorretta Harp MD [name]   HPI:  The patient is a 80 yo AA male who is overweight and who recently saw Dr. Gwenlyn Found on 12/23/12 for evaluation of claudication and leg pain. He has a history of hypertension, hyperlipidemia, diabetes and CAD. His last cardiac catheterization performed 10/05/08 by Dr. Diona Browner and revealed an occluded circumflex marginal branch at the site of the previous stent placed in May 2010. He did undergo angioplasty to that vessel in Spet 2010 . His ejection fraction was 50-55% with lateral wall hypokinesia. He has claudication there is bilateral and lifestyle limiting to less than 100 feet. Doppler studies performed her office suggest high-grade bilateral mid SFA disease. Dr. Gwenlyn Found performed abdominal aortography with bifemoral runoff on 12/07/12 revealing two-vessel runoff below the knee bilaterally, long 90% segment segmental calcified mid right SFA stenosis and a chronic total occlusion of the mid left SFA which he attempted to cross unsuccessfully. The patient had a nuclear stress test on 11/17/12 which revealed a large, mostly fixed defect. There may be some periinfarct ischemia. This is in a territory of previously described MI and angioplasty and was considered intermediate risk. LVEF 40% with inferolateral hypokinesis.  The patient presented yesterday to the ER with chest pain which he says is similar to the pain in 2010 when he had the stent. [name] He reports it having CP for the last 2-3 weeks which gets worse with exertion . Yesterday it was 10/10 with radiation to both shoulders neck and arm. It was associated with nausea and SOB. He has had one negative troponin POC at 2046hrs yesterday. EKG with TWI in I, aVL which are not new but at 0500hrs today he had acute ST depression in V 2-5. CT angio was negative for PE. No history of GIB. The  patient denies vomiting, fever, orthopnea, dizziness, PND, abdominal pain, hematochezia, melena, lower extremity edema, claudication.  He had dynamic T wave inversion in the lateral leads and ultimately evolved positive troponins. Because of this he was brought to the Cath Lab today for diagnostic arctic catheterization via the right radial approach which showed three-vessel disease with mild decrease in LV function. 2 days later he underwent coronary artery bypass grafting x4 by Dr. Erasmo Leventhal the LIMA to his LAD, vein to obtuse marginal branch and sequential vein to acute marginal and PDA appears postop course was uncomplicated. He is rehabilitating at home. He denies chest pain shortness, short of breath or claudication.    Current Outpatient Prescriptions  Medication Sig Dispense Refill  . amLODipine (NORVASC) 10 MG tablet Take 10 mg by mouth daily.      [name] aspirin EC 325 MG EC tablet Take 1 tablet (325 mg total) by mouth daily.  30 tablet  0  . insulin NPH (HUMULIN N,NOVOLIN N) 100 UNIT/ML injection Inject 10 Units into the skin 2 (two) times daily.      . metFORMIN (GLUCOPHAGE) 500 MG tablet Take 1 tablet (500 mg total) by mouth 2 (two) times daily with a meal. Pt takes 1000 mg in the morning 500 mg at 12 pm and 1000 mg at supper  60 tablet  0  . metoprolol tartrate (LOPRESSOR) 25 MG tablet Take 1 tablet (25 mg total) by mouth 3 (three) times daily.  90 tablet  1  . Multiple Vitamin (MULTIVITAMIN WITH MINERALS) TABS tablet Take 1 tablet by mouth daily.      [name]  pravastatin (PRAVACHOL) 40 MG tablet Take 40 mg by mouth every evening.       . tamsulosin (FLOMAX) 0.4 MG CAPS capsule Take 0.4 mg by mouth at bedtime.        No current facility-administered medications for this visit.    No Known Allergies  History   Social History  . Marital Status: Married    Spouse Name: N/A    Number of Children: N/A  . Years of Education: N/A   Occupational History  . Not on file.   Social  History Main Topics  . Smoking status: Former Smoker -- 0.75 packs/day for 20 years    Types: Cigarettes    Quit date: 07/08/1980  . Smokeless tobacco: Never Used  . Alcohol Use: Yes     Comment: 12/07/2012 "quit drinking alcohol > 25 yr ago"  . Drug Use: No  . Sexual Activity: No   Other Topics Concern  . Not on file   Social History Narrative   Occupation: Company secretary   Married   Exercises regularly     Review of Systems: General: negative for chills, fever, night sweats or weight changes.  Cardiovascular: negative for chest pain, dyspnea on exertion, edema, orthopnea, palpitations, paroxysmal nocturnal dyspnea or shortness of breath Dermatological: negative for rash Respiratory: negative for cough or wheezing Urologic: negative for hematuria Abdominal: negative for nausea, vomiting, diarrhea, bright red blood per rectum, melena, or hematemesis Neurologic: negative for visual changes, syncope, or dizziness All other systems reviewed and are otherwise negative except as noted above.    Blood pressure 118/72, pulse 80, height 6' 1.5" (1.867 m), weight 245 lb 6.4 oz (111.313 kg).  General appearance: alert and no distress Neck: no adenopathy, no carotid bruit, no JVD, supple, symmetrical, trachea midline and thyroid not enlarged, symmetric, no tenderness/mass/nodules Lungs: clear to auscultation bilaterally Heart: regular rate and rhythm, S1, S2 normal, no murmur, click, rub or gallop Extremities: extremities normal, atraumatic, no cyanosis or edema  EKG normal sinus rhythm at 80 without ST or T wave changes  ASSESSMENT AND PLAN:   CLAUDICATION, INTERMITTENT Status post failed attempt at percutaneous or vasodilation of the left SFA chronic total occlusion. He does have long segmental calcified 90% mid right SFA stent stenosis. He really does not ambulate much and therefore it denies lifestyle limiting claudication.  CORONARY ARTERY DISEASE The patient was admitted with a  non-STEMI 01/03/13 underwent cath the right radial approach revealing three-vessel disease with an EF of 50%. 2 days later he underwent coronary artery bypass grafting x4 by Dr. Merilynn Finland the LIMA to his LAD, vein graft to obtuse marginal branch #1, sequential vein graft to acute marginal and posterior descending branches of the RCA. His postop course was unremarkable. We have talked about cardiac rehabilitation but because of constraints he is unable to participate. He denies chest pain or shortness of breath.  HYPERTENSION Well-controlled on current medications  HYPERCHOLESTEROLEMIA On statin therapy with lipid profile followed by his PCP      Lorretta Harp MD [facility], [facility] 03/01/2013 5:18 PM

## 2013-03-01 NOTE — Assessment & Plan Note (Signed)
Well-controlled on current medications 

## 2013-03-01 NOTE — Assessment & Plan Note (Signed)
Status post failed attempt at percutaneous or vasodilation of the left SFA chronic total occlusion. He does have long segmental calcified 90% mid right SFA stent stenosis. He really does not ambulate much and therefore it denies lifestyle limiting claudication.

## 2013-03-24 ENCOUNTER — Other Ambulatory Visit: Payer: Self-pay | Admitting: Physician Assistant

## 2013-04-26 ENCOUNTER — Encounter: Payer: Self-pay | Admitting: Cardiovascular Disease

## 2013-04-26 ENCOUNTER — Ambulatory Visit (INDEPENDENT_AMBULATORY_CARE_PROVIDER_SITE_OTHER): Payer: Medicare Other | Admitting: Cardiovascular Disease

## 2013-04-26 VITALS — BP 142/76 | HR 68 | Ht 73.0 in | Wt 251.1 lb

## 2013-04-26 DIAGNOSIS — I10 Essential (primary) hypertension: Secondary | ICD-10-CM

## 2013-04-26 DIAGNOSIS — E782 Mixed hyperlipidemia: Secondary | ICD-10-CM

## 2013-04-26 DIAGNOSIS — I739 Peripheral vascular disease, unspecified: Secondary | ICD-10-CM

## 2013-04-26 DIAGNOSIS — I2510 Atherosclerotic heart disease of native coronary artery without angina pectoris: Secondary | ICD-10-CM

## 2013-04-26 DIAGNOSIS — E7800 Pure hypercholesterolemia, unspecified: Secondary | ICD-10-CM

## 2013-04-26 NOTE — Progress Notes (Signed)
04/26/2013 [name] [name]   1931/12/07  [id number]  Primary Physician [name], MD Primary Cardiologist: Lorretta Harp MD [name]   HPI:  The patient is a 80 yo AA male who is overweight and who I saw  on 12/23/12 for evaluation of claudication and leg pain. He has a history of hypertension, hyperlipidemia, diabetes and CAD. His last cardiac catheterization performed 10/05/08 by Dr. Diona Browner and revealed an occluded circumflex marginal branch at the site of the previous stent placed in May 2010. He did undergo angioplasty to that vessel in Spet 2010 . His ejection fraction was 50-55% with lateral wall hypokinesia. He has claudication there is bilateral and lifestyle limiting to less than 100 feet. Doppler studies performed her office suggest high-grade bilateral mid SFA disease. Dr. Gwenlyn Found performed abdominal aortography with bifemoral runoff on 12/07/12 revealing two-vessel runoff below the knee bilaterally, long 90% segment segmental calcified mid right SFA stenosis and a chronic total occlusion of the mid left SFA which he attempted to cross unsuccessfully. The patient had a nuclear stress test on 11/17/12 which revealed a large, mostly fixed defect. There may be some periinfarct ischemia. This is in a territory of previously described MI and angioplasty and was considered intermediate risk. LVEF 40% with inferolateral hypokinesis.  The patient presented to the ER with chest pain which he says is similar to the pain in 2010 when he had the stent. [name] He reports it having CP for 2-3 weeks which got worse with exertion . Yesterday it was 10/10 with radiation to both shoulders neck and arm. It was associated with nausea and SOB. He has had one negative troponin POC at 2046hrs yesterday. EKG with TWI in I, aVL which are not new but at 0500hrs today he had acute ST depression in V 2-5. CT angio was negative for PE. No history of GIB. The patient denies vomiting, fever,  orthopnea, dizziness, PND, abdominal pain, hematochezia, melena, lower extremity edema, claudication.  He had dynamic T wave inversion in the lateral leads and ultimately evolved positive troponins. Because of this he was brought to the Cath Lab for diagnostic arctic catheterization via the right radial approach which showed three-vessel disease with mild decrease in LV function. 2 days later he underwent coronary artery bypass grafting x4 by Dr. Erasmo Leventhal the LIMA to his LAD, vein to obtuse marginal branch and sequential vein to acute marginal and PDA appears postop course was uncomplicated. He is rehabilitating at home. Since I saw him in the office 03/01/13 he denies chest pain, shortness of breath or claudication. Unfortunately he was unable to participate in cardiac rehabilitation because of fiscal constraints.    Current Outpatient Prescriptions  Medication Sig Dispense Refill  . amLODipine (NORVASC) 10 MG tablet Take 10 mg by mouth daily.      [name] aspirin EC 325 MG EC tablet Take 1 tablet (325 mg total) by mouth daily.  30 tablet  0  . insulin NPH (HUMULIN N,NOVOLIN N) 100 UNIT/ML injection Inject 10-20 Units into the skin 2 (two) times daily.       [name] lisinopril (PRINIVIL,ZESTRIL) 20 MG tablet Take 20 mg by mouth 2 (two) times daily.      . metFORMIN (GLUCOPHAGE) 500 MG tablet Take 1 tablet (500 mg total) by mouth 2 (two) times daily with a meal. Pt takes 1000 mg in the morning 500 mg at 12 pm and 1000 mg at supper  60 tablet  0  . metoprolol tartrate (  LOPRESSOR) 25 MG tablet Take 25 mg by mouth 2 (two) times daily.      . Multiple Vitamin (MULTIVITAMIN WITH MINERALS) TABS tablet Take 1 tablet by mouth daily.      . pravastatin (PRAVACHOL) 40 MG tablet Take 40 mg by mouth every evening.       . sulfamethoxazole-trimethoprim (BACTRIM DS) 800-160 MG per tablet Take 1 tablet by mouth at bedtime.      . tamsulosin (FLOMAX) 0.4 MG CAPS capsule Take 0.4 mg by mouth at bedtime.       .  tolterodine (DETROL LA) 2 MG 24 hr capsule Take 1 capsule by mouth daily.       No current facility-administered medications for this visit.    No Known Allergies  History   Social History  . Marital Status: Married    Spouse Name: N/A    Number of Children: N/A  . Years of Education: N/A   Occupational History  . Not on file.   Social History Main Topics  . Smoking status: Former Smoker -- 0.75 packs/day for 20 years    Types: Cigarettes    Quit date: 07/08/1980  . Smokeless tobacco: Never Used  . Alcohol Use: Yes     Comment: 12/07/2012 "quit drinking alcohol > 25 yr ago"  . Drug Use: No  . Sexual Activity: No   Other Topics Concern  . Not on file   Social History Narrative   Occupation: Company secretary   Married   Exercises regularly     Review of Systems: General: negative for chills, fever, night sweats or weight changes.  Cardiovascular: negative for chest pain, dyspnea on exertion, edema, orthopnea, palpitations, paroxysmal nocturnal dyspnea or shortness of breath Dermatological: negative for rash Respiratory: negative for cough or wheezing Urologic: negative for hematuria Abdominal: negative for nausea, vomiting, diarrhea, bright red blood per rectum, melena, or hematemesis Neurologic: negative for visual changes, syncope, or dizziness All other systems reviewed and are otherwise negative except as noted above.    Blood pressure 142/76, pulse 68, height 6\' 1"  (1.854 m), weight 251 lb 1.6 oz (113.898 kg).  General appearance: alert and no distress Neck: no adenopathy, no carotid bruit, no JVD, supple, symmetrical, trachea midline and thyroid not enlarged, symmetric, no tenderness/mass/nodules Lungs: clear to auscultation bilaterally Heart: regular rate and rhythm, S1, S2 normal, no murmur, click, rub or gallop Extremities: extremities normal, atraumatic, no cyanosis or edema and diminished pedal pulses  EKG not performed today  ASSESSMENT AND PLAN:    CLAUDICATION, INTERMITTENT History of known peripheral arterial disease. He had abdominal aortography with bifemoral were not performed 12/07/12  Revealing two-vessel runoff below the knee bilaterally with long 90% segmental calcified mid right SFA stenosis and chronic occlusion of the mid left SFA which I was unsuccessful in crossing. He currently denies claudication.  CORONARY ARTERY DISEASE Status post coronary artery bypass grafting by Dr. Merilynn Finland last year with a LIMA to his LAD, vein to obtuse marginal branch, sequential vein to the PDA as well. His postop course was uncomplicated. He currently denies chest pain or shortness of breath.  HYPERTENSION Controlled on current medications  HYPERCHOLESTEROLEMIA On statin therapy. We will recheck a lipid and liver profile      Lorretta Harp MD [facility], [facility] 04/26/2013 9:10 AM

## 2013-04-26 NOTE — Assessment & Plan Note (Signed)
On statin therapy. We will recheck a lipid and liver profile 

## 2013-04-26 NOTE — Assessment & Plan Note (Signed)
History of known peripheral arterial disease. He had abdominal aortography with bifemoral were not performed 12/07/12  Revealing two-vessel runoff below the knee bilaterally with long 90% segmental calcified mid right SFA stenosis and chronic occlusion of the mid left SFA which I was unsuccessful in crossing. He currently denies claudication.

## 2013-04-26 NOTE — Assessment & Plan Note (Signed)
Controlled on current medications 

## 2013-04-26 NOTE — Assessment & Plan Note (Signed)
Status post coronary artery bypass grafting by Dr. Merilynn Finland last year with a LIMA to his LAD, vein to obtuse marginal branch, sequential vein to the PDA as well. His postop course was uncomplicated. He currently denies chest pain or shortness of breath.

## 2013-04-26 NOTE — Patient Instructions (Signed)
Your physician has requested that you have a lower extremity arterial duplex. This test is an ultrasound of the arteries in the legs. It looks at arterial blood flow in the legs. Allow one hour for Lower Arterial scans. There are no restrictions or special instructions  Dr Gwenlyn Found has ordered you to have some blood work done. (Lipid)  Your physician recommends that you schedule a follow-up appointment in 6 months with an extender.  Dr Gwenlyn Found wants you to follow-up in 12 months. You will receive a reminder letter in the mail two months in advance. If you don't receive a letter, please call our office to schedule the follow-up appointment.

## 2013-05-09 ENCOUNTER — Ambulatory Visit (HOSPITAL_COMMUNITY): Admission: RE | Admit: 2013-05-09 | Payer: Medicare Other | Source: Ambulatory Visit

## 2013-05-16 ENCOUNTER — Telehealth (HOSPITAL_COMMUNITY): Payer: Self-pay | Admitting: *Deleted

## 2013-05-16 NOTE — Telephone Encounter (Signed)
error 

## 2013-05-19 ENCOUNTER — Telehealth (HOSPITAL_COMMUNITY): Payer: Self-pay | Admitting: *Deleted

## 2013-05-20 ENCOUNTER — Telehealth (HOSPITAL_COMMUNITY): Payer: Self-pay | Admitting: *Deleted

## 2013-05-20 ENCOUNTER — Encounter: Payer: Self-pay | Admitting: Cardiovascular Disease

## 2013-05-25 ENCOUNTER — Other Ambulatory Visit: Payer: Self-pay

## 2013-05-25 ENCOUNTER — Encounter (HOSPITAL_COMMUNITY): Payer: Self-pay | Admitting: Emergency Medicine

## 2013-05-25 DIAGNOSIS — M19049 Primary osteoarthritis, unspecified hand: Secondary | ICD-10-CM | POA: Insufficient documentation

## 2013-05-25 DIAGNOSIS — Z79899 Other long term (current) drug therapy: Secondary | ICD-10-CM | POA: Insufficient documentation

## 2013-05-25 DIAGNOSIS — I252 Old myocardial infarction: Secondary | ICD-10-CM | POA: Insufficient documentation

## 2013-05-25 DIAGNOSIS — I2510 Atherosclerotic heart disease of native coronary artery without angina pectoris: Secondary | ICD-10-CM | POA: Insufficient documentation

## 2013-05-25 DIAGNOSIS — Z951 Presence of aortocoronary bypass graft: Secondary | ICD-10-CM | POA: Insufficient documentation

## 2013-05-25 DIAGNOSIS — E785 Hyperlipidemia, unspecified: Secondary | ICD-10-CM | POA: Insufficient documentation

## 2013-05-25 DIAGNOSIS — Z87891 Personal history of nicotine dependence: Secondary | ICD-10-CM | POA: Insufficient documentation

## 2013-05-25 DIAGNOSIS — R141 Gas pain: Secondary | ICD-10-CM | POA: Insufficient documentation

## 2013-05-25 DIAGNOSIS — Z9861 Coronary angioplasty status: Secondary | ICD-10-CM | POA: Insufficient documentation

## 2013-05-25 DIAGNOSIS — E119 Type 2 diabetes mellitus without complications: Secondary | ICD-10-CM | POA: Insufficient documentation

## 2013-05-25 DIAGNOSIS — Z7982 Long term (current) use of aspirin: Secondary | ICD-10-CM | POA: Insufficient documentation

## 2013-05-25 DIAGNOSIS — Z794 Long term (current) use of insulin: Secondary | ICD-10-CM | POA: Insufficient documentation

## 2013-05-25 DIAGNOSIS — I10 Essential (primary) hypertension: Secondary | ICD-10-CM | POA: Insufficient documentation

## 2013-05-25 DIAGNOSIS — R1013 Epigastric pain: Secondary | ICD-10-CM | POA: Insufficient documentation

## 2013-05-25 DIAGNOSIS — R011 Cardiac murmur, unspecified: Secondary | ICD-10-CM | POA: Insufficient documentation

## 2013-05-25 DIAGNOSIS — E7800 Pure hypercholesterolemia, unspecified: Secondary | ICD-10-CM | POA: Insufficient documentation

## 2013-05-25 DIAGNOSIS — Z8719 Personal history of other diseases of the digestive system: Secondary | ICD-10-CM | POA: Insufficient documentation

## 2013-05-25 DIAGNOSIS — Z8546 Personal history of malignant neoplasm of prostate: Secondary | ICD-10-CM | POA: Insufficient documentation

## 2013-05-25 DIAGNOSIS — Z87828 Personal history of other (healed) physical injury and trauma: Secondary | ICD-10-CM | POA: Insufficient documentation

## 2013-05-25 DIAGNOSIS — R143 Flatulence: Secondary | ICD-10-CM

## 2013-05-25 DIAGNOSIS — R142 Eructation: Secondary | ICD-10-CM

## 2013-05-25 DIAGNOSIS — R0789 Other chest pain: Secondary | ICD-10-CM | POA: Insufficient documentation

## 2013-05-25 NOTE — ED Notes (Signed)
Pt. reports mid chest pain " burning " onset this evening with slight SOB , denies nausea or diaphoresis . Pt. stated history of CAD / CABG his cardiologist is Dr. Gwenlyn Found.

## 2013-05-26 ENCOUNTER — Emergency Department (HOSPITAL_COMMUNITY)
Admission: EM | Admit: 2013-05-26 | Discharge: 2013-05-26 | Disposition: A | Discharge status: Home / Self Care | Payer: Medicare Other | Attending: Emergency Medicine | Admitting: Emergency Medicine

## 2013-05-26 ENCOUNTER — Emergency Department (HOSPITAL_COMMUNITY): Payer: Medicare Other

## 2013-05-26 DIAGNOSIS — R141 Gas pain: Secondary | ICD-10-CM

## 2013-05-26 DIAGNOSIS — R079 Chest pain, unspecified: Secondary | ICD-10-CM

## 2013-05-26 LAB — TROPONIN I
Troponin I: <0.3 ng/mL (ref <0.30)
Troponin I: <0.3 ng/mL (ref <0.30)

## 2013-05-26 LAB — BASIC METABOLIC PANEL
BUN: 15 mg/dL (ref 6–23)
CALCIUM: 9.4 mg/dL (ref 8.4–10.5)
CHLORIDE: 107 meq/L (ref 96–112)
CO2: 21 meq/L (ref 19–32)
Creatinine, Ser: 1.05 mg/dL (ref 0.50–1.35)
GFR calc Af Amer: 76 mL/min — ABNORMAL LOW (ref >90)
GFR calc non Af Amer: 65 mL/min — ABNORMAL LOW (ref >90)
Glucose, Bld: 145 mg/dL — ABNORMAL HIGH (ref 70–99)
Potassium: 4.2 mEq/L (ref 3.7–5.3)
Sodium: 142 mEq/L (ref 137–147)

## 2013-05-26 LAB — CBC
HEMATOCRIT: 42 % (ref 39.0–52.0)
Hemoglobin: 13.8 g/dL (ref 13.0–17.0)
MCH: 28.9 pg (ref 26.0–34.0)
MCHC: 32.9 g/dL (ref 30.0–36.0)
MCV: 88.1 fL (ref 78.0–100.0)
PLATELETS: 188 10*3/uL (ref 150–400)
RBC: 4.77 MIL/uL (ref 4.22–5.81)
RDW: 16 % — ABNORMAL HIGH (ref 11.5–15.5)
WBC: 9.8 10*3/uL (ref 4.0–10.5)

## 2013-05-26 LAB — PRO B NATRIURETIC PEPTIDE: Pro B Natriuretic peptide (BNP): 164.9 pg/mL (ref 0–450)

## 2013-05-26 NOTE — Discharge Instructions (Signed)
We saw you in the ER for the chest pain/shortness of breath. All of our cardiac workup is normal, including labs, EKG and chest X-RAY are normal. We are not sure what is causing your discomfort, but we feel comfortable sending you home at this time. The workup in the ER is not complete, and you should follow up with your primary care doctor for further evaluation.  

## 2013-05-26 NOTE — ED Notes (Signed)
MD at bedside. 

## 2013-05-26 NOTE — ED Notes (Signed)
Pt ambulated in hall way very well; denies Sob or chest pain; no evidence of Sob; Pt has steady gait.
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
## 2013-06-09 ENCOUNTER — Telehealth: Payer: Self-pay | Admitting: Cardiovascular Disease

## 2013-06-10 NOTE — Telephone Encounter (Signed)
Closed encounter °

## 2013-06-28 ENCOUNTER — Other Ambulatory Visit: Payer: Medicare Other

## 2013-06-29 ENCOUNTER — Ambulatory Visit (INDEPENDENT_AMBULATORY_CARE_PROVIDER_SITE_OTHER): Payer: Medicare Other | Admitting: Cardiovascular Disease

## 2013-06-29 ENCOUNTER — Encounter: Payer: Self-pay | Admitting: Cardiovascular Disease

## 2013-06-29 VITALS — BP 122/70 | HR 87 | Ht 73.0 in | Wt 247.0 lb

## 2013-06-29 DIAGNOSIS — I10 Essential (primary) hypertension: Secondary | ICD-10-CM

## 2013-06-29 DIAGNOSIS — E7800 Pure hypercholesterolemia, unspecified: Secondary | ICD-10-CM

## 2013-06-29 DIAGNOSIS — R32 Unspecified urinary incontinence: Secondary | ICD-10-CM

## 2013-06-29 DIAGNOSIS — I2510 Atherosclerotic heart disease of native coronary artery without angina pectoris: Secondary | ICD-10-CM

## 2013-06-29 NOTE — Assessment & Plan Note (Signed)
He has had urinary incontinence since his CABG.  I suspect he may have had trauma related to the foley.  He has seen a urologist and I have encouraged him to go back since it is still a problem.

## 2013-06-29 NOTE — Assessment & Plan Note (Signed)
[name] is doing well. Not having episodes of chest pain. He's not had his lipids checked in quite some time. He was lost to followup when we moved to her current location.  We'll check his lipids today. I'll see him again in 6 months. He eats a fairly healthy diet and tries to eat low-fat. His breakfast typically consists of 2 egg whites, 1 trip of [place] bacon,  and a bowl of oatmeal.    He has PVD but his symptoms are tolerable at present  Since his surgery he's had urinary incontinence. He has seen urologist but apparently the medications that they prescribe did not do any good. I've encouraged him to go back to the urologist to see if they can offer any further advice

## 2013-06-29 NOTE — Patient Instructions (Signed)
Lab work today ( bmet,lipid,hepatic)     Your physician wants you to follow-up in: 6 months with fasting lab work. You will receive a reminder letter in the mail two months in advance. If you don't receive a letter, please call our office to schedule the follow-up appointment.
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
## 2013-06-30 LAB — HEPATIC FUNCTION PANEL
ALBUMIN: 3.8 g/dL (ref 3.5–5.2)
ALT: 17 U/L (ref 0–53)
AST: 17 U/L (ref 0–37)
Alkaline Phosphatase: 81 U/L (ref 39–117)
BILIRUBIN TOTAL: 0.5 mg/dL (ref 0.2–1.2)
Bilirubin, Direct: 0.1 mg/dL (ref 0.0–0.3)
Total Protein: 7 g/dL (ref 6.0–8.3)

## 2013-06-30 LAB — BASIC METABOLIC PANEL
BUN: 26 mg/dL — ABNORMAL HIGH (ref 6–23)
CHLORIDE: 110 meq/L (ref 96–112)
CO2: 25 mEq/L (ref 19–32)
Calcium: 9.1 mg/dL (ref 8.4–10.5)
Creatinine, Ser: 1.5 mg/dL (ref 0.4–1.5)
GFR: 59.78 mL/min — ABNORMAL LOW (ref >60.00)
GLUCOSE: 274 mg/dL — AB (ref 70–99)
POTASSIUM: 4.5 meq/L (ref 3.5–5.1)
Sodium: 140 mEq/L (ref 135–145)

## 2013-07-15 ENCOUNTER — Ambulatory Visit: Payer: Medicare Other | Admitting: Endocrinology

## 2013-07-18 ENCOUNTER — Encounter: Payer: Self-pay | Admitting: Endocrinology

## 2013-07-18 ENCOUNTER — Ambulatory Visit (INDEPENDENT_AMBULATORY_CARE_PROVIDER_SITE_OTHER): Payer: Medicare Other | Admitting: Endocrinology

## 2013-07-18 VITALS — BP 126/70 | HR 84 | Temp 98.6°F | Resp 16 | Ht 71.0 in | Wt 248.2 lb

## 2013-07-18 DIAGNOSIS — IMO0001 Reserved for inherently not codable concepts without codable children: Secondary | ICD-10-CM

## 2013-07-18 DIAGNOSIS — E1165 Type 2 diabetes mellitus with hyperglycemia: Principal | ICD-10-CM

## 2013-07-18 LAB — COMPREHENSIVE METABOLIC PANEL
ALK PHOS: 78 U/L (ref 39–117)
ALT: 14 U/L (ref 0–53)
AST: 17 U/L (ref 0–37)
Albumin: 4 g/dL (ref 3.5–5.2)
BILIRUBIN TOTAL: 0.2 mg/dL (ref 0.2–1.2)
BUN: 18 mg/dL (ref 6–23)
CO2: 26 mEq/L (ref 19–32)
CREATININE: 1.3 mg/dL (ref 0.4–1.5)
Calcium: 9.2 mg/dL (ref 8.4–10.5)
Chloride: 106 mEq/L (ref 96–112)
GFR: 67.74 mL/min (ref >60.00)
Glucose, Bld: 280 mg/dL — ABNORMAL HIGH (ref 70–99)
Potassium: 4.6 mEq/L (ref 3.5–5.1)
SODIUM: 138 meq/L (ref 135–145)
TOTAL PROTEIN: 7.2 g/dL (ref 6.0–8.3)

## 2013-07-18 LAB — URINALYSIS, ROUTINE W REFLEX MICROSCOPIC
Bilirubin Urine: NEGATIVE
Hgb urine dipstick: NEGATIVE
Leukocytes, UA: NEGATIVE
NITRITE: NEGATIVE
PH: 5.5 (ref 5.0–8.0)
TOTAL PROTEIN, URINE-UPE24: NEGATIVE
Urine Glucose: >=1000 — AB
Urobilinogen, UA: 0.2 (ref 0.0–1.0)

## 2013-07-18 LAB — LIPID PANEL
CHOLESTEROL: 142 mg/dL (ref 0–200)
HDL: 37.6 mg/dL — ABNORMAL LOW (ref >39.00)
LDL Cholesterol: 63 mg/dL (ref 0–99)
NonHDL: 104.4
Total CHOL/HDL Ratio: 4
Triglycerides: 205 mg/dL — ABNORMAL HIGH (ref 0.0–149.0)
VLDL: 41 mg/dL — AB (ref 0.0–40.0)

## 2013-07-18 LAB — HEMOGLOBIN A1C: HEMOGLOBIN A1C: 7.5 % — AB (ref 4.6–6.5)

## 2013-07-18 LAB — MICROALBUMIN / CREATININE URINE RATIO
Creatinine,U: 193.2 mg/dL
MICROALB/CREAT RATIO: 0.7 mg/g (ref 0.0–30.0)
Microalb, Ur: 1.4 mg/dL (ref 0.0–1.9)

## 2013-07-18 NOTE — Patient Instructions (Addendum)
Please check blood sugars at least half the time about 2-3 hours after any meal and 3 times per week on waking up.  Please bring blood sugar monitor to each visit  N insulin 20 in am before breakfast  R insulin 8 units before each meal  Metformin 2 in am and 1 in pm
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
## 2013-08-15 ENCOUNTER — Ambulatory Visit (INDEPENDENT_AMBULATORY_CARE_PROVIDER_SITE_OTHER): Payer: Medicare Other | Admitting: Endocrinology

## 2013-08-15 ENCOUNTER — Encounter: Payer: Self-pay | Admitting: Endocrinology

## 2013-08-15 VITALS — BP 140/78 | HR 64 | Temp 98.3°F | Resp 16 | Ht 71.0 in | Wt 253.6 lb

## 2013-08-15 DIAGNOSIS — E1159 Type 2 diabetes mellitus with other circulatory complications: Secondary | ICD-10-CM
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
## 2013-08-15 NOTE — Patient Instructions (Addendum)
Please check blood sugars at least half the time about 2 hours after any meal and 3 times per week on waking up. Please bring blood sugar monitor to each visit  Continue N insulin 20 in am  R insulin 8 units with breakfast and 10 just before supper

## 2013-10-06 ENCOUNTER — Other Ambulatory Visit (INDEPENDENT_AMBULATORY_CARE_PROVIDER_SITE_OTHER): Payer: Medicare Other

## 2013-10-06 DIAGNOSIS — E1159 Type 2 diabetes mellitus with other circulatory complications: Secondary | ICD-10-CM

## 2013-10-06 LAB — BASIC METABOLIC PANEL
BUN: 21 mg/dL (ref 6–23)
CALCIUM: 9 mg/dL (ref 8.4–10.5)
CHLORIDE: 111 meq/L (ref 96–112)
CO2: 25 mEq/L (ref 19–32)
Creatinine, Ser: 1.3 mg/dL (ref 0.4–1.5)
GFR: 70.82 mL/min (ref >60.00)
Glucose, Bld: 94 mg/dL (ref 70–99)
Potassium: 4 mEq/L (ref 3.5–5.1)
Sodium: 141 mEq/L (ref 135–145)

## 2013-10-06 LAB — HEMOGLOBIN A1C: HEMOGLOBIN A1C: 7.3 % — AB (ref 4.6–6.5)

## 2013-10-17 ENCOUNTER — Ambulatory Visit: Payer: Medicare Other | Admitting: Endocrinology

## 2013-10-18 ENCOUNTER — Encounter: Payer: Self-pay | Admitting: Endocrinology

## 2013-10-18 ENCOUNTER — Ambulatory Visit (INDEPENDENT_AMBULATORY_CARE_PROVIDER_SITE_OTHER): Payer: Medicare Other | Admitting: Endocrinology

## 2013-10-18 VITALS — BP 137/69 | HR 62 | Temp 98.2°F | Resp 16 | Ht 71.0 in | Wt 261.2 lb

## 2013-10-18 DIAGNOSIS — E1159 Type 2 diabetes mellitus with other circulatory complications: Secondary | ICD-10-CM

## 2013-10-18 DIAGNOSIS — E7800 Pure hypercholesterolemia, unspecified: Secondary | ICD-10-CM

## 2013-10-18 DIAGNOSIS — I10 Essential (primary) hypertension: Secondary | ICD-10-CM
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
## 2013-10-18 NOTE — Patient Instructions (Addendum)
STOP TAKING 15 N (CLOUDY) INSULIN AT SUPPER AND TAKE 12 AT BEDTIME  Metformin 2 tabs twice daily  Ask for Dr Nicki Reaper McDiarmid

## 2013-10-28 ENCOUNTER — Other Ambulatory Visit: Payer: Self-pay | Admitting: Certified Registered Nurse Anesthetist

## 2013-10-28 ENCOUNTER — Other Ambulatory Visit: Payer: Self-pay | Admitting: Endocrinology

## 2013-10-28 DIAGNOSIS — I10 Essential (primary) hypertension: Secondary | ICD-10-CM

## 2013-10-28 MED ORDER — LISINOPRIL 20 MG PO TABS: 20.0000 mg | ORAL_TABLET | Freq: Two times a day (BID) | ORAL | Status: DC

## 2013-10-28 MED ORDER — METFORMIN HCL 500 MG PO TABS: ORAL_TABLET | ORAL | Status: DC

## 2013-10-28 NOTE — Telephone Encounter (Signed)
See below and please advise, all of the medications that are being requested are listed under historical providers.  Thanks!

## 2013-10-28 NOTE — Telephone Encounter (Signed)
Patient need refills of Amlodipine 10 mg,  Lisinopril 20 mg, Metformin 500 mg, Tolterodine 2 mg.

## 2013-11-21 ENCOUNTER — Telehealth: Payer: Self-pay | Admitting: Endocrinology

## 2013-11-21 ENCOUNTER — Other Ambulatory Visit: Payer: Self-pay | Admitting: *Deleted

## 2013-11-21 MED ORDER — PRAVASTATIN SODIUM 40 MG PO TABS: 40.0000 mg | ORAL_TABLET | Freq: Every evening | ORAL | Status: DC

## 2013-11-21 MED ORDER — METFORMIN HCL 500 MG PO TABS: ORAL_TABLET | ORAL | Status: DC

## 2013-11-21 MED ORDER — LISINOPRIL 20 MG PO TABS: 20.0000 mg | ORAL_TABLET | Freq: Two times a day (BID) | ORAL | Status: DC

## 2013-11-21 NOTE — Telephone Encounter (Signed)
Pravastatin, metformin, lysteril called into [facility] on [address]

## 2013-11-28 ENCOUNTER — Telehealth: Payer: Self-pay | Admitting: Endocrinology

## 2013-11-28 NOTE — Telephone Encounter (Signed)
Patient called about having the below medications refilled, Dr. Dwyane Dee said he only refills his Diabetic medications, patient was screaming.  I told him he didn't need to scream at me but he said "your damn right I'm going to scream I need these medications filled".  I again informed him to contact the prescribing Dr. He kept screaming and I hung up the phone after repeatedly asking him not to scream and yell at me.

## 2013-11-28 NOTE — Telephone Encounter (Signed)
We can only fill his lisinopril and amlodipine for blood pressure and he needs to talk to his urologist for the Detrol/bladder medication and the cardiologist for the metoprolol [name] [name], please call him

## 2013-11-28 NOTE — Telephone Encounter (Signed)
[name] [name] showed up to the office this afternoon wanting a refill on his medication  Tolterodine ER 2mg  Metoprolol 25 mg Lisinopril 20mg  Amlodipine 10 mg tab  [facility]   Patient would like 2-3 month supply   Please advise patient   Thank you

## 2013-11-29 ENCOUNTER — Other Ambulatory Visit: Payer: Self-pay | Admitting: *Deleted

## 2013-11-29 MED ORDER — LISINOPRIL 20 MG PO TABS: 20.0000 mg | ORAL_TABLET | Freq: Two times a day (BID) | ORAL | Status: DC

## 2013-12-21 ENCOUNTER — Other Ambulatory Visit (INDEPENDENT_AMBULATORY_CARE_PROVIDER_SITE_OTHER): Payer: Medicare Other | Admitting: *Deleted

## 2013-12-21 DIAGNOSIS — E1159 Type 2 diabetes mellitus with other circulatory complications: Secondary | ICD-10-CM

## 2013-12-21 LAB — LIPID PANEL
Cholesterol: 128 mg/dL (ref 0–200)
HDL: 35.3 mg/dL — ABNORMAL LOW (ref >39.00)
LDL CALC: 83 mg/dL (ref 0–99)
NONHDL: 92.7
Total CHOL/HDL Ratio: 4
Triglycerides: 51 mg/dL (ref 0.0–149.0)
VLDL: 10.2 mg/dL (ref 0.0–40.0)

## 2013-12-21 LAB — COMPREHENSIVE METABOLIC PANEL
ALBUMIN: 3.8 g/dL (ref 3.5–5.2)
ALK PHOS: 74 U/L (ref 39–117)
ALT: 12 U/L (ref 0–53)
AST: 16 U/L (ref 0–37)
BILIRUBIN TOTAL: 0.7 mg/dL (ref 0.2–1.2)
BUN: 16 mg/dL (ref 6–23)
CO2: 24 meq/L (ref 19–32)
Calcium: 8.8 mg/dL (ref 8.4–10.5)
Chloride: 111 mEq/L (ref 96–112)
Creatinine, Ser: 1.1 mg/dL (ref 0.4–1.5)
GFR: 79.44 mL/min (ref >60.00)
Glucose, Bld: 110 mg/dL — ABNORMAL HIGH (ref 70–99)
Potassium: 4.1 mEq/L (ref 3.5–5.1)
SODIUM: 142 meq/L (ref 135–145)
TOTAL PROTEIN: 6.8 g/dL (ref 6.0–8.3)

## 2013-12-21 LAB — HEMOGLOBIN A1C: HEMOGLOBIN A1C: 7 % — AB (ref 4.6–6.5)

## 2013-12-27 ENCOUNTER — Encounter: Payer: Self-pay | Admitting: Cardiovascular Disease

## 2013-12-27 ENCOUNTER — Ambulatory Visit (INDEPENDENT_AMBULATORY_CARE_PROVIDER_SITE_OTHER): Payer: Medicare Other | Admitting: Cardiovascular Disease

## 2013-12-27 VITALS — BP 130/80 | HR 69 | Ht 73.0 in | Wt 256.8 lb

## 2013-12-27 DIAGNOSIS — I2510 Atherosclerotic heart disease of native coronary artery without angina pectoris: Secondary | ICD-10-CM

## 2013-12-27 DIAGNOSIS — E785 Hyperlipidemia, unspecified: Secondary | ICD-10-CM

## 2013-12-27 MED ORDER — ATORVASTATIN CALCIUM 40 MG PO TABS: 40.0000 mg | ORAL_TABLET | Freq: Every day | ORAL | Status: DC

## 2013-12-27 NOTE — Patient Instructions (Signed)
Your physician has recommended you make the following change in your medication:  STOP Pravachol START Atorvastatin 40 mg once daily  Your physician recommends that you return for lab work in: 3 months  You will need to FAST for this appointment - nothing to eat or drink after midnight the night before except water.  Your physician wants you to follow-up in: 6 months with Dr. Acie Fredrickson.  You will receive a reminder letter in the mail two months in advance. If you don't receive a letter, please call our office to schedule the follow-up appointment.

## 2013-12-27 NOTE — Assessment & Plan Note (Addendum)
[place] is doing ok.  No angina Continue same meds. Lab Results  Component Value Date   CHOL 128 12/21/2013   HDL 35.30* 12/21/2013   LDLCALC 83 12/21/2013   TRIG 51.0 12/21/2013   CHOLHDL 4 12/21/2013    His last LDL is 83.  His goal is 70 or below. We'll stop the Pravachol and start on atorvastatin 40 mg a day. We'll check labs again in 3 months. I'll see him in 6 months.
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
## 2014-01-12 ENCOUNTER — Other Ambulatory Visit: Payer: Medicare Other

## 2014-01-12 ENCOUNTER — Encounter (HOSPITAL_COMMUNITY): Payer: Self-pay | Admitting: Cardiovascular Disease

## 2014-01-17 ENCOUNTER — Encounter: Payer: Self-pay | Admitting: Endocrinology

## 2014-01-17 ENCOUNTER — Other Ambulatory Visit: Payer: Self-pay | Admitting: *Deleted

## 2014-01-17 ENCOUNTER — Ambulatory Visit (INDEPENDENT_AMBULATORY_CARE_PROVIDER_SITE_OTHER): Payer: Medicare Other | Admitting: Endocrinology

## 2014-01-17 VITALS — BP 136/71 | HR 68 | Temp 98.5°F | Resp 16 | Ht 73.0 in | Wt 257.0 lb

## 2014-01-17 DIAGNOSIS — I10 Essential (primary) hypertension: Secondary | ICD-10-CM

## 2014-01-17 DIAGNOSIS — E1165 Type 2 diabetes mellitus with hyperglycemia: Secondary | ICD-10-CM

## 2014-01-17 DIAGNOSIS — IMO0002 Reserved for concepts with insufficient information to code with codable children: Secondary | ICD-10-CM

## 2014-01-17 DIAGNOSIS — E1142 Type 2 diabetes mellitus with diabetic polyneuropathy: Secondary | ICD-10-CM

## 2014-01-17 DIAGNOSIS — G629 Polyneuropathy, unspecified: Secondary | ICD-10-CM

## 2014-01-17 MED ORDER — GLUCOSE BLOOD VI STRP: ORAL_STRIP | Status: DC

## 2014-01-17 MED ORDER — GABAPENTIN 100 MG PO CAPS: 100.0000 mg | ORAL_CAPSULE | Freq: Three times a day (TID) | ORAL | Status: DC
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
## 2014-01-17 NOTE — Patient Instructions (Signed)
Take cloudy insulin at 9 pm in evening and not at supper  Take clear insulin 15 min before eating

## 2014-03-21 ENCOUNTER — Other Ambulatory Visit (INDEPENDENT_AMBULATORY_CARE_PROVIDER_SITE_OTHER): Payer: Medicare Other | Admitting: *Deleted

## 2014-03-21 DIAGNOSIS — E785 Hyperlipidemia, unspecified: Secondary | ICD-10-CM

## 2014-03-21 LAB — BASIC METABOLIC PANEL
BUN: 19 mg/dL (ref 6–23)
CALCIUM: 9.1 mg/dL (ref 8.4–10.5)
CHLORIDE: 107 meq/L (ref 96–112)
CO2: 28 meq/L (ref 19–32)
CREATININE: 1.22 mg/dL (ref 0.40–1.50)
GFR: 73.42 mL/min (ref >60.00)
GLUCOSE: 245 mg/dL — AB (ref 70–99)
Potassium: 4.2 mEq/L (ref 3.5–5.1)
Sodium: 139 mEq/L (ref 135–145)

## 2014-03-21 LAB — LIPID PANEL
CHOLESTEROL: 101 mg/dL (ref 0–200)
HDL: 33.7 mg/dL — ABNORMAL LOW (ref >39.00)
LDL Cholesterol: 52 mg/dL (ref 0–99)
NonHDL: 67.3
Total CHOL/HDL Ratio: 3
Triglycerides: 75 mg/dL (ref 0.0–149.0)
VLDL: 15 mg/dL (ref 0.0–40.0)

## 2014-03-21 LAB — HEPATIC FUNCTION PANEL
ALT: 11 U/L (ref 0–53)
AST: 14 U/L (ref 0–37)
Albumin: 3.8 g/dL (ref 3.5–5.2)
Alkaline Phosphatase: 93 U/L (ref 39–117)
BILIRUBIN TOTAL: 0.5 mg/dL (ref 0.2–1.2)
Bilirubin, Direct: 0.1 mg/dL (ref 0.0–0.3)
Total Protein: 7 g/dL (ref 6.0–8.3)

## 2014-03-23 LAB — HM DIABETES EYE EXAM

## 2014-04-13 ENCOUNTER — Other Ambulatory Visit: Payer: Medicare Other

## 2014-04-18 ENCOUNTER — Ambulatory Visit: Payer: Medicare Other | Admitting: Endocrinology

## 2014-04-19 ENCOUNTER — Encounter: Payer: Self-pay | Admitting: Endocrinology

## 2014-04-19 ENCOUNTER — Other Ambulatory Visit (INDEPENDENT_AMBULATORY_CARE_PROVIDER_SITE_OTHER): Payer: Medicare Other

## 2014-04-19 DIAGNOSIS — IMO0002 Reserved for concepts with insufficient information to code with codable children: Secondary | ICD-10-CM

## 2014-04-19 DIAGNOSIS — E1165 Type 2 diabetes mellitus with hyperglycemia: Secondary | ICD-10-CM

## 2014-04-19 LAB — BASIC METABOLIC PANEL
BUN: 18 mg/dL (ref 6–23)
CALCIUM: 9.3 mg/dL (ref 8.4–10.5)
CO2: 25 mEq/L (ref 19–32)
CREATININE: 1.23 mg/dL (ref 0.40–1.50)
Chloride: 110 mEq/L (ref 96–112)
GFR: 72.71 mL/min (ref >60.00)
Glucose, Bld: 125 mg/dL — ABNORMAL HIGH (ref 70–99)
Potassium: 4.1 mEq/L (ref 3.5–5.1)
Sodium: 141 mEq/L (ref 135–145)

## 2014-04-19 LAB — HEMOGLOBIN A1C: HEMOGLOBIN A1C: 8.2 % — AB (ref 4.6–6.5)

## 2014-04-20 ENCOUNTER — Telehealth: Payer: Self-pay | Admitting: Cardiovascular Disease

## 2014-04-24 NOTE — Telephone Encounter (Signed)
Closed encounter °

## 2014-04-27 ENCOUNTER — Ambulatory Visit (INDEPENDENT_AMBULATORY_CARE_PROVIDER_SITE_OTHER): Payer: Medicare Other | Admitting: Endocrinology

## 2014-04-27 ENCOUNTER — Encounter: Payer: Self-pay | Admitting: Endocrinology

## 2014-04-27 VITALS — BP 128/44 | HR 61 | Temp 98.2°F | Resp 16 | Ht 73.0 in | Wt 251.2 lb

## 2014-04-27 DIAGNOSIS — IMO0002 Reserved for concepts with insufficient information to code with codable children: Secondary | ICD-10-CM

## 2014-04-27 DIAGNOSIS — E1142 Type 2 diabetes mellitus with diabetic polyneuropathy: Secondary | ICD-10-CM

## 2014-04-27 DIAGNOSIS — I10 Essential (primary) hypertension: Secondary | ICD-10-CM | POA: Diagnosis not present

## 2014-04-27 DIAGNOSIS — G629 Polyneuropathy, unspecified: Secondary | ICD-10-CM | POA: Diagnosis not present

## 2014-04-27 DIAGNOSIS — E1165 Type 2 diabetes mellitus with hyperglycemia: Secondary | ICD-10-CM
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
## 2014-04-27 NOTE — Patient Instructions (Addendum)
CLOUDY insulin 18 in am  CLEAR 8 AT bfst WITH THE CLOUDY insulin  If eating a sandwich/starchy foods at lunch then take 8 clear; none for salad  Clear 12 at supper, always take before eating  Please check blood sugars at least half the time about 2 hours after any meal and 3 times per week on waking up. Please bring blood sugar monitor to each visit. Recommended blood sugar levels about 2 hours after meal is 140-180 and on waking up 90-130

## 2014-05-09 ENCOUNTER — Ambulatory Visit: Payer: Medicare Other | Admitting: Cardiovascular Disease

## 2014-05-26 ENCOUNTER — Ambulatory Visit: Payer: Medicare Other | Admitting: Cardiovascular Disease

## 2014-06-08 ENCOUNTER — Ambulatory Visit: Payer: Medicare Other | Admitting: Endocrinology

## 2014-06-08 DIAGNOSIS — Z0289 Encounter for other administrative examinations: Secondary | ICD-10-CM

## 2014-06-16 ENCOUNTER — Telehealth: Payer: Self-pay | Admitting: Endocrinology

## 2014-06-16 ENCOUNTER — Other Ambulatory Visit: Payer: Self-pay | Admitting: *Deleted

## 2014-06-16 MED ORDER — GLUCOSE BLOOD VI STRP: ORAL_STRIP | Status: DC

## 2014-06-16 NOTE — Telephone Encounter (Signed)
Pt needs rx for the test accucheck aviva test strips up to 3 times daily

## 2014-06-19 ENCOUNTER — Other Ambulatory Visit: Payer: Self-pay | Admitting: *Deleted

## 2014-06-19 ENCOUNTER — Telehealth: Payer: Self-pay | Admitting: Endocrinology

## 2014-06-19 MED ORDER — METFORMIN HCL 500 MG PO TABS: ORAL_TABLET | ORAL | Status: DC

## 2014-06-19 MED ORDER — GLUCOSE BLOOD VI STRP: ORAL_STRIP | Status: DC

## 2014-06-19 NOTE — Telephone Encounter (Signed)
Team health note dated 06/16/14 at 7:33 pm  Prescription Refill or Medication Request (non symptomatic) Initial Comment Caller states he is .Marland KitchenMarland KitchenEmmanuel from [employer]. Pt is [place], [place] Aug 08, 1931/ DR. Elayne Snare. Prescripton for test strips needs to be Accucheck Aviva Plus. CB [phone]  Patient needs Accucheck Aviva Plus Blood Glucose Test Strips. (Rx was sent in for [place] Blood Glucose Test Strips).

## 2014-06-19 NOTE — Telephone Encounter (Signed)
Patient need a refill of metformin.

## 2014-06-19 NOTE — Telephone Encounter (Signed)
rx sent

## 2014-06-28 ENCOUNTER — Encounter: Payer: Self-pay | Admitting: Cardiovascular Disease

## 2014-06-28 ENCOUNTER — Ambulatory Visit (INDEPENDENT_AMBULATORY_CARE_PROVIDER_SITE_OTHER): Payer: Medicare Other | Admitting: Cardiovascular Disease

## 2014-06-28 VITALS — BP 142/80 | HR 60 | Ht 71.0 in | Wt 259.1 lb

## 2014-06-28 DIAGNOSIS — Z951 Presence of aortocoronary bypass graft: Secondary | ICD-10-CM

## 2014-06-28 DIAGNOSIS — I739 Peripheral vascular disease, unspecified: Secondary | ICD-10-CM

## 2014-06-28 MED ORDER — CILOSTAZOL 50 MG PO TABS: 50.0000 mg | ORAL_TABLET | Freq: Two times a day (BID) | ORAL | Status: DC

## 2014-06-28 NOTE — Patient Instructions (Signed)
Follow up with Dr Gwenlyn Found as needed.   Start Pletal 50mg  twice a day.   Dr Gwenlyn Found doesn't feel that it is necessary to continue your lower extremity dopplers.

## 2014-06-28 NOTE — Assessment & Plan Note (Signed)
[name] [name] has PAD with angiographic documented high-grade segmental right and total left SFA stenosis with single-vessel runoff below the knees bilaterally. I attempted unsuccessfully to percutaneously revascularize his left SFA. He does complain of lifestyle including claudication. I'm going to begin him on Pletal 50 mg by mouth twice a day I will see him back when necessary. [name] will continue to follow his cardiac issues.

## 2014-06-28 NOTE — Progress Notes (Signed)
06/28/2014 [name] [name]   1931/10/28  [id number]  Primary Physician [name] [name]., MD Primary Cardiologist: [name] Harp MD [name] [name]   HPI:   The patient is a 81yo AA male who is overweight and who I saw on 12/23/12 for evaluation of claudication and leg pain. He has a history of hypertension, hyperlipidemia, diabetes and CAD. His last cardiac catheterization performed 10/05/08 by Dr. Diona [name] and revealed an occluded circumflex marginal branch at the site of the previous stent placed in May 2010. He did undergo angioplasty to that vessel in Spet 2010 . His ejection fraction was 50-55% with lateral wall hypokinesia. He has claudication there is bilateral and lifestyle limiting to less than 100 feet. Doppler studies performed her office suggest high-grade bilateral mid SFA disease. Dr. Gwenlyn [name] performed abdominal aortography with bifemoral runoff on 12/07/12 revealing two-vessel runoff below the knee bilaterally, long 90% segment segmental calcified mid right SFA stenosis and a chronic total occlusion of the mid left SFA which he attempted to cross unsuccessfully. The patient had a nuclear stress test on 11/17/12 which revealed a large, mostly fixed defect. There may be some periinfarct ischemia. This is in a territory of previously described MI and angioplasty and was considered intermediate risk. LVEF 40% with inferolateral hypokinesis.  The patient presented to the ER with chest pain which he says is similar to the pain in 2010 when he had the stent. [name] He reports it having CP for 2-3 weeks which got worse with exertion . Yesterday it was 10/10 with radiation to both shoulders neck and arm. It was associated with nausea and SOB. He has had one negative troponin POC at 2046hrs yesterday. EKG with TWI in I, aVL which are not new but at 0500hrs today he had acute ST depression in V 2-5. CT angio was negative for PE. No history of GIB. The patient denies vomiting, fever,  orthopnea, dizziness, PND, abdominal pain, hematochezia, melena, lower extremity edema, claudication.  He had dynamic T wave inversion in the lateral leads and ultimately evolved positive troponins. Because of this he was brought to the Cath Lab for diagnostic arctic catheterization via the right radial approach which showed three-vessel disease with mild decrease in LV function. 2 days later he underwent coronary artery bypass grafting x4 by Dr. Erasmo [name] the LIMA to his LAD, vein to obtuse marginal branch and sequential vein to acute marginal and PDA appears postop course was uncomplicated. He is rehabilitating at home. Since I last saw him in the office a year ago he does complain of lifestyle limiting claudication. Dr. Cathie [name] follows his coronary artery disease and other  Risk factors.  Current Outpatient Prescriptions  Medication Sig Dispense Refill  . amLODipine (NORVASC) 10 MG tablet Take 10 mg by mouth daily.    [name] aspirin EC 325 MG EC tablet Take 1 tablet (325 mg total) by mouth daily. 30 tablet 0  . atorvastatin (LIPITOR) 40 MG tablet Take 1 tablet (40 mg total) by mouth daily. 90 tablet 3  . fexofenadine (ALLEGRA) 180 MG tablet Take 180 mg by mouth daily as needed for allergies or rhinitis.    [name] gabapentin (NEURONTIN) 100 MG capsule Take 1 capsule (100 mg total) by mouth 3 (three) times daily. 90 capsule 3  . glucose blood (ACCU-CHEK AVIVA PLUS) test strip Use as instructed to check blood sugar 2 times per day dx code E11.59 100 each 3  . insulin NPH Human (HUMULIN N,NOVOLIN N) 100 UNIT/ML injection Inject  20 Units into the skin 2 (two) times daily before a meal.     . Insulin Regular Human (NOVOLIN R RELION IJ) Inject 6-8 Units as directed at bedtime and may repeat dose one time if needed.     [name] lisinopril (PRINIVIL,ZESTRIL) 20 MG tablet Take 1 tablet (20 mg total) by mouth 2 (two) times daily. 180 tablet 1  . metFORMIN (GLUCOPHAGE) 500 MG tablet Take 1 tablet with breakfast and 2  tablets with supper 90 tablet 0  . metoprolol tartrate (LOPRESSOR) 25 MG tablet Take 25 mg by mouth 2 (two) times daily.    . Multiple Vitamin (MULTIVITAMIN WITH MINERALS) TABS tablet Take 1 tablet by mouth daily.    [name] oxybutynin (DITROPAN) 5 MG tablet Take 5 mg by mouth 3 (three) times daily.    . cilostazol (PLETAL) 50 MG tablet Take 1 tablet (50 mg total) by mouth 2 (two) times daily. 60 tablet 6   No current facility-administered medications for this visit.    No Known Allergies  History   Social History  . Marital Status: Married    Spouse Name: N/A  . Number of Children: N/A  . Years of Education: N/A   Occupational History  . Not on file.   Social History Main Topics  . Smoking status: Former Smoker -- 0.75 packs/day for 20 years    Types: Cigarettes    Quit date: 07/08/1980  . Smokeless tobacco: Never Used  . Alcohol Use: Yes     Comment: 12/07/2012 "quit drinking alcohol > 25 yr ago"  . Drug Use: No  . Sexual Activity: No   Other Topics Concern  . Not on file   Social History Narrative   Occupation: Company secretary   Married   Exercises regularly     Review of Systems: General: negative for chills, fever, night sweats or weight changes.  Cardiovascular: negative for chest pain, dyspnea on exertion, edema, orthopnea, palpitations, paroxysmal nocturnal dyspnea or shortness of breath Dermatological: negative for rash Respiratory: negative for cough or wheezing Urologic: negative for hematuria Abdominal: negative for nausea, vomiting, diarrhea, bright red blood per rectum, melena, or hematemesis Neurologic: negative for visual changes, syncope, or dizziness All other systems reviewed and are otherwise negative except as noted above.    Blood pressure 142/80, pulse 60, height 5\' 11"  (1.803 m), weight 259 lb 1.6 oz (117.527 kg).  General appearance: alert and no distress Neck: no adenopathy, no carotid bruit, no JVD, supple, symmetrical, trachea midline and thyroid  not enlarged, symmetric, no tenderness/mass/nodules Lungs: clear to auscultation bilaterally Heart: regular rate and rhythm, S1, S2 normal, no murmur, click, rub or gallop Extremities: extremities normal, atraumatic, no cyanosis or edema  EKG normal sinus rhythm at 60 with nonspecific ST and T-wave changes. I personally reviewed this EKG  ASSESSMENT AND PLAN:   Peripheral arterial disease Mr. Silversmith has PAD with angiographic documented high-grade segmental right and total left SFA stenosis with single-vessel runoff below the knees bilaterally. I attempted unsuccessfully to percutaneously revascularize his left SFA. He does complain of lifestyle including claudication. I'm going to begin him on Pletal 50 mg by mouth twice a day I will see him back when necessary. Dr. Cathie [name] will continue to follow his cardiac issues.       [name] Harp MD FACP,FACC,FAHA, [facility] 06/28/2014 11:29 AM

## 2014-07-11 ENCOUNTER — Encounter: Payer: Self-pay | Admitting: Endocrinology

## 2014-07-11 ENCOUNTER — Encounter: Payer: Medicare Other | Admitting: Endocrinology

## 2014-07-11 ENCOUNTER — Other Ambulatory Visit: Payer: Self-pay | Admitting: *Deleted

## 2014-07-11 DIAGNOSIS — E1151 Type 2 diabetes mellitus with diabetic peripheral angiopathy without gangrene: Secondary | ICD-10-CM

## 2014-07-11 LAB — BASIC METABOLIC PANEL
BUN: 22 mg/dL (ref 6–23)
CHLORIDE: 108 meq/L (ref 96–112)
CO2: 25 mEq/L (ref 19–32)
Calcium: 9.5 mg/dL (ref 8.4–10.5)
Creatinine, Ser: 1.29 mg/dL (ref 0.40–1.50)
GFR: 68.79 mL/min (ref >60.00)
Glucose, Bld: 139 mg/dL — ABNORMAL HIGH (ref 70–99)
POTASSIUM: 4.5 meq/L (ref 3.5–5.1)
SODIUM: 139 meq/L (ref 135–145)

## 2014-07-11 LAB — HEMOGLOBIN A1C: HEMOGLOBIN A1C: 7.3 % — AB (ref 4.6–6.5)

## 2014-07-11 MED ORDER — METFORMIN HCL 500 MG PO TABS: ORAL_TABLET | ORAL | Status: DC

## 2014-07-11 NOTE — Progress Notes (Signed)
This encounter was created in error - please disregard.
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
## 2014-07-12 ENCOUNTER — Other Ambulatory Visit: Payer: Self-pay | Admitting: *Deleted

## 2014-07-20 ENCOUNTER — Other Ambulatory Visit: Payer: Self-pay | Admitting: *Deleted

## 2014-07-20 MED ORDER — ONETOUCH DELICA LANCETS FINE MISC: Status: DC

## 2014-07-20 MED ORDER — GLUCOSE BLOOD VI STRP: ORAL_STRIP | Status: DC

## 2014-07-26 ENCOUNTER — Encounter: Payer: Self-pay | Admitting: Endocrinology

## 2014-07-26 ENCOUNTER — Ambulatory Visit (INDEPENDENT_AMBULATORY_CARE_PROVIDER_SITE_OTHER): Payer: Medicare Other | Admitting: Endocrinology

## 2014-07-26 VITALS — BP 134/84 | HR 64 | Temp 97.9°F | Resp 16 | Ht 71.0 in | Wt 258.2 lb

## 2014-07-26 DIAGNOSIS — E1142 Type 2 diabetes mellitus with diabetic polyneuropathy: Secondary | ICD-10-CM | POA: Diagnosis not present

## 2014-07-26 DIAGNOSIS — E1165 Type 2 diabetes mellitus with hyperglycemia: Secondary | ICD-10-CM | POA: Diagnosis not present

## 2014-07-26 DIAGNOSIS — G629 Polyneuropathy, unspecified: Secondary | ICD-10-CM | POA: Diagnosis not present

## 2014-07-26 DIAGNOSIS — IMO0002 Reserved for concepts with insufficient information to code with codable children: Secondary | ICD-10-CM
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
## 2014-07-26 NOTE — Patient Instructions (Signed)
Check sugars at midday and also supper time 1/2 the time and some after supper  No regular sodas  Reduce clear insulin to 15 at supper  If eating sandwich at lunch take 8-10 clear insiulin

## 2014-08-14 ENCOUNTER — Telehealth: Payer: Self-pay | Admitting: *Deleted

## 2014-08-14 NOTE — Telephone Encounter (Signed)
Sugar is not causing any symptoms. Need to get a list of his blood sugars by time of the day. Meanwhile he needs to discuss his pain issues with PCP

## 2014-08-14 NOTE — Telephone Encounter (Signed)
Patient called, he said his sugars have been running over 300 after lunch since last Thursday, he is taking the correct dose of insulin and pills, He said his kidney's are hurting him very badly as is his bladder. He said he discussed this with you last week.  Patient was crying on the phone and sounded like he was in a lot of pain.  Please advise.  CB # [id number]

## 2014-08-14 NOTE — Telephone Encounter (Signed)
He needs to discuss his kidney and bladder issues with his PCP or go to the urgent care center.  Need to know exactly how many units of the clear and cloudy insulin he is taking and when

## 2014-08-14 NOTE — Telephone Encounter (Signed)
Noted, patient is aware, he was going to go to the Urgent care.

## 2014-08-14 NOTE — Telephone Encounter (Signed)
Patient says he takes 20 units of Cloudy in the early a.m,  at 12 p.m he takes 10 units of clear, 5 p.m 10 more units of clear, then when he checks his sugar between 9-10 p.m his sugar has gone up to 180-190. Patient still crying and very upset, he kept saying someone needs to treat his sugar before he has a stroke and dies.

## 2014-08-21 ENCOUNTER — Telehealth: Payer: Self-pay | Admitting: Endocrinology

## 2014-08-21 NOTE — Telephone Encounter (Signed)
Dr. Cruzita Lederer,  Please see below and advise in Dr. [name] absence.  Thanks

## 2014-08-21 NOTE — Telephone Encounter (Signed)
A message was left for the patient informing him of the morning appointment on 07/19, he was instructed to call if he could not make it.

## 2014-08-21 NOTE — Telephone Encounter (Signed)
Dr. Renne Crigler,  Please see his telephone note to Dr. Dwyane Dee from 08/14/14, he said he was leaving his house and wouldn't be back until 4-5.  He has been very upset since last Monday and was pretty much screaming at [place] through the phone.

## 2014-08-21 NOTE — Telephone Encounter (Signed)
Pt states his blood sugars are running from 200-340 over the past few weeks, please advise

## 2014-08-21 NOTE — Telephone Encounter (Signed)
I will see him tomorrow am for a visit, if OK with him.

## 2014-08-21 NOTE — Telephone Encounter (Signed)
[name] [name], Can you please get his sugars by the relationship with the meal (also need to know if he skips meals, e.g. B'fast)? I tried to call him but no answer. C

## 2014-08-22 ENCOUNTER — Ambulatory Visit: Payer: Medicare Other | Admitting: Internal Medicine

## 2014-09-06 ENCOUNTER — Ambulatory Visit: Payer: Medicare Other | Admitting: Endocrinology

## 2014-09-06 ENCOUNTER — Telehealth: Payer: Self-pay | Admitting: Endocrinology

## 2014-09-06 ENCOUNTER — Encounter: Payer: Self-pay | Admitting: *Deleted

## 2014-09-06 NOTE — Telephone Encounter (Signed)
Letter mailed

## 2014-09-06 NOTE — Telephone Encounter (Signed)
Patient no showed today's appt. Please advise on how to follow up. °A. No follow up necessary. °B. Follow up urgent. Contact patient immediately. °C. Follow up necessary. Contact patient and schedule visit in ___ days. °D. Follow up advised. Contact patient and schedule visit in ____weeks. ° °

## 2014-09-18 ENCOUNTER — Ambulatory Visit (INDEPENDENT_AMBULATORY_CARE_PROVIDER_SITE_OTHER): Payer: Medicare Other | Admitting: Cardiovascular Disease

## 2014-09-18 ENCOUNTER — Encounter: Payer: Self-pay | Admitting: Cardiovascular Disease

## 2014-09-18 VITALS — BP 100/62 | HR 67 | Ht 71.0 in | Wt 262.4 lb

## 2014-09-18 DIAGNOSIS — E7800 Pure hypercholesterolemia, unspecified: Secondary | ICD-10-CM

## 2014-09-18 DIAGNOSIS — I2510 Atherosclerotic heart disease of native coronary artery without angina pectoris: Secondary | ICD-10-CM

## 2014-09-18 DIAGNOSIS — I739 Peripheral vascular disease, unspecified: Secondary | ICD-10-CM | POA: Diagnosis not present

## 2014-09-18 LAB — BASIC METABOLIC PANEL
BUN: 19 mg/dL (ref 6–23)
CALCIUM: 9.2 mg/dL (ref 8.4–10.5)
CO2: 27 mEq/L (ref 19–32)
Chloride: 109 mEq/L (ref 96–112)
Creatinine, Ser: 1.2 mg/dL (ref 0.40–1.50)
GFR: 74.74 mL/min (ref >60.00)
GLUCOSE: 136 mg/dL — AB (ref 70–99)
Potassium: 4.6 mEq/L (ref 3.5–5.1)
SODIUM: 141 meq/L (ref 135–145)

## 2014-09-18 LAB — LIPID PANEL
CHOL/HDL RATIO: 3
Cholesterol: 110 mg/dL (ref 0–200)
HDL: 32.8 mg/dL — ABNORMAL LOW (ref >39.00)
LDL CALC: 65 mg/dL (ref 0–99)
NONHDL: 77.51
Triglycerides: 65 mg/dL (ref 0.0–149.0)
VLDL: 13 mg/dL (ref 0.0–40.0)

## 2014-09-18 LAB — HEPATIC FUNCTION PANEL
ALBUMIN: 3.8 g/dL (ref 3.5–5.2)
ALT: 16 U/L (ref 0–53)
AST: 19 U/L (ref 0–37)
Alkaline Phosphatase: 89 U/L (ref 39–117)
Bilirubin, Direct: 0.1 mg/dL (ref 0.0–0.3)
TOTAL PROTEIN: 7.1 g/dL (ref 6.0–8.3)
Total Bilirubin: 0.4 mg/dL (ref 0.2–1.2)

## 2014-09-18 NOTE — Patient Instructions (Signed)
Medication Instructions:  Your physician recommends that you continue on your current medications as directed. Please refer to the Current Medication list given to you today.   Labwork: TODAY - cholesterol, liver, basic metabolic panel   Testing/Procedures: None Ordered   Follow-Up: Your physician wants you to follow-up in: 6 months with Dr. Nahser.  You will receive a reminder letter in the mail two months in advance. If you don't receive a letter, please call our office to schedule the follow-up appointment.     
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
## 2014-09-21 ENCOUNTER — Telehealth: Payer: Self-pay | Admitting: Nurse Practitioner

## 2014-09-21 NOTE — Telephone Encounter (Signed)
I told [name] [name] that I would check with Dr. Gwenlyn Found to see if vascular surgery would be an option. From Dr. Gwenlyn Found, he has poor run-off below the occlusion so a graft would not stay open. So unfortunately, we are left with medical manangement .        Reported this information to patient per Dr. Acie Fredrickson.  Patient verbalized understanding and agreement.

## 2014-10-25 ENCOUNTER — Encounter: Payer: Self-pay | Admitting: Endocrinology

## 2014-10-25 ENCOUNTER — Other Ambulatory Visit: Payer: Self-pay | Admitting: *Deleted

## 2014-10-25 ENCOUNTER — Ambulatory Visit (INDEPENDENT_AMBULATORY_CARE_PROVIDER_SITE_OTHER): Payer: Medicare Other | Admitting: Endocrinology

## 2014-10-25 VITALS — BP 142/84 | HR 72 | Temp 98.5°F | Resp 18 | Ht 71.0 in | Wt 256.8 lb

## 2014-10-25 DIAGNOSIS — I739 Peripheral vascular disease, unspecified: Secondary | ICD-10-CM

## 2014-10-25 DIAGNOSIS — I70219 Atherosclerosis of native arteries of extremities with intermittent claudication, unspecified extremity: Secondary | ICD-10-CM

## 2014-10-25 DIAGNOSIS — Z23 Encounter for immunization: Secondary | ICD-10-CM

## 2014-10-25 DIAGNOSIS — E1142 Type 2 diabetes mellitus with diabetic polyneuropathy: Secondary | ICD-10-CM

## 2014-10-25 DIAGNOSIS — G629 Polyneuropathy, unspecified: Secondary | ICD-10-CM

## 2014-10-25 DIAGNOSIS — E1165 Type 2 diabetes mellitus with hyperglycemia: Secondary | ICD-10-CM | POA: Diagnosis not present

## 2014-10-25 DIAGNOSIS — IMO0002 Reserved for concepts with insufficient information to code with codable children: Secondary | ICD-10-CM

## 2014-10-25 LAB — POCT GLYCOSYLATED HEMOGLOBIN (HGB A1C): Hemoglobin A1C: 7
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
## 2014-10-25 NOTE — Patient Instructions (Addendum)
CLOUDY INSULIN 24 U NITS IN AM PLUS 6 UNITS OF CLEAR AT BREAKFAST  SUPPER TIME TAKE CLEAR 15 UNITS  USE diet tea  Check blood sugars on waking up ..every 2 days  Also check blood sugars about 2 hours after a meal and do this after different meals by rotation Recommended blood sugar levels on waking up is 90-130 and about 2 hours after meal is 140-180 Please bring blood sugar monitor to each visit.

## 2014-11-02 ENCOUNTER — Other Ambulatory Visit: Payer: Self-pay | Admitting: Endocrinology

## 2014-11-03 ENCOUNTER — Telehealth: Payer: Self-pay | Admitting: Endocrinology

## 2014-11-03 ENCOUNTER — Other Ambulatory Visit: Payer: Self-pay | Admitting: *Deleted

## 2014-11-03 MED ORDER — AMLODIPINE BESYLATE 10 MG PO TABS: 10.0000 mg | ORAL_TABLET | Freq: Every day | ORAL | Status: DC

## 2014-11-03 MED ORDER — LISINOPRIL 20 MG PO TABS: 20.0000 mg | ORAL_TABLET | Freq: Two times a day (BID) | ORAL | Status: DC

## 2014-11-03 NOTE — Telephone Encounter (Signed)
Pt needs refill on BP med please call into [facility]

## 2014-11-03 NOTE — Telephone Encounter (Signed)
Rx refilled per pt's request.  

## 2014-12-06 ENCOUNTER — Ambulatory Visit: Payer: Medicare Other | Admitting: Endocrinology

## 2014-12-18 IMAGING — CR DG CHEST 1V PORT
1 series · 1 of 1 positions shown · non-contrast
Comparison: 01/01/2013

CLINICAL DATA: Postop CABG

EXAM:
PORTABLE CHEST - 1 VIEW

[AP]
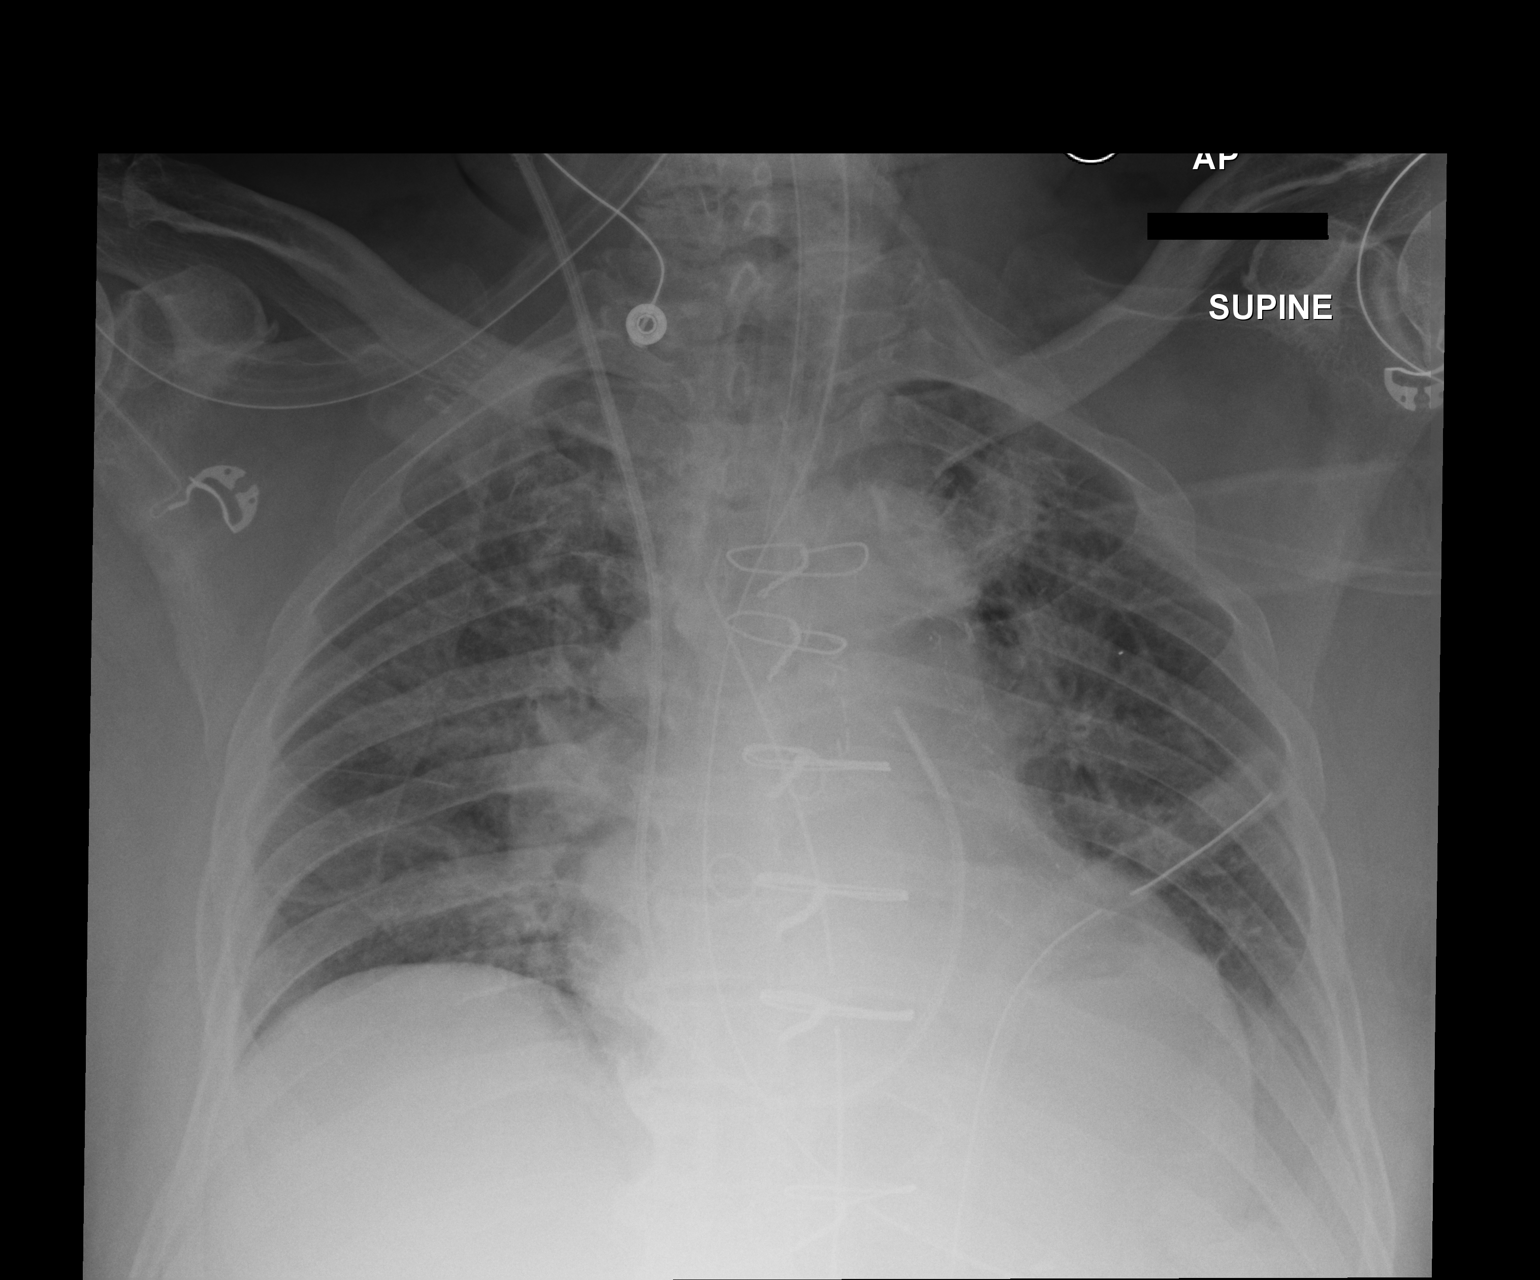

[1 of 1 positions shown; findings below may reference images not displayed]

FINDINGS: Postop CABG. Endotracheal tube in satisfactory position. Swan-Ganz
catheter tip in the right main pulmonary artery. NG tube in the
stomach. Mediastinal drain in place. Left chest tube in place.

Negative for pneumothorax.

Hypoventilation with bibasilar atelectasis.
IMPRESSION: Support lines in satisfactory position.  No pneumothorax

Hypoventilation with mild bibasilar atelectasis.

## 2014-12-19 DIAGNOSIS — M1990 Unspecified osteoarthritis, unspecified site: Secondary | ICD-10-CM | POA: Insufficient documentation

## 2014-12-19 IMAGING — CR DG CHEST 1V PORT
1 series · 1 of 1 positions shown · non-contrast
Comparison: 01/05/2013.

CLINICAL DATA: CABG.

EXAM:
PORTABLE CHEST - 1 VIEW

[AP]
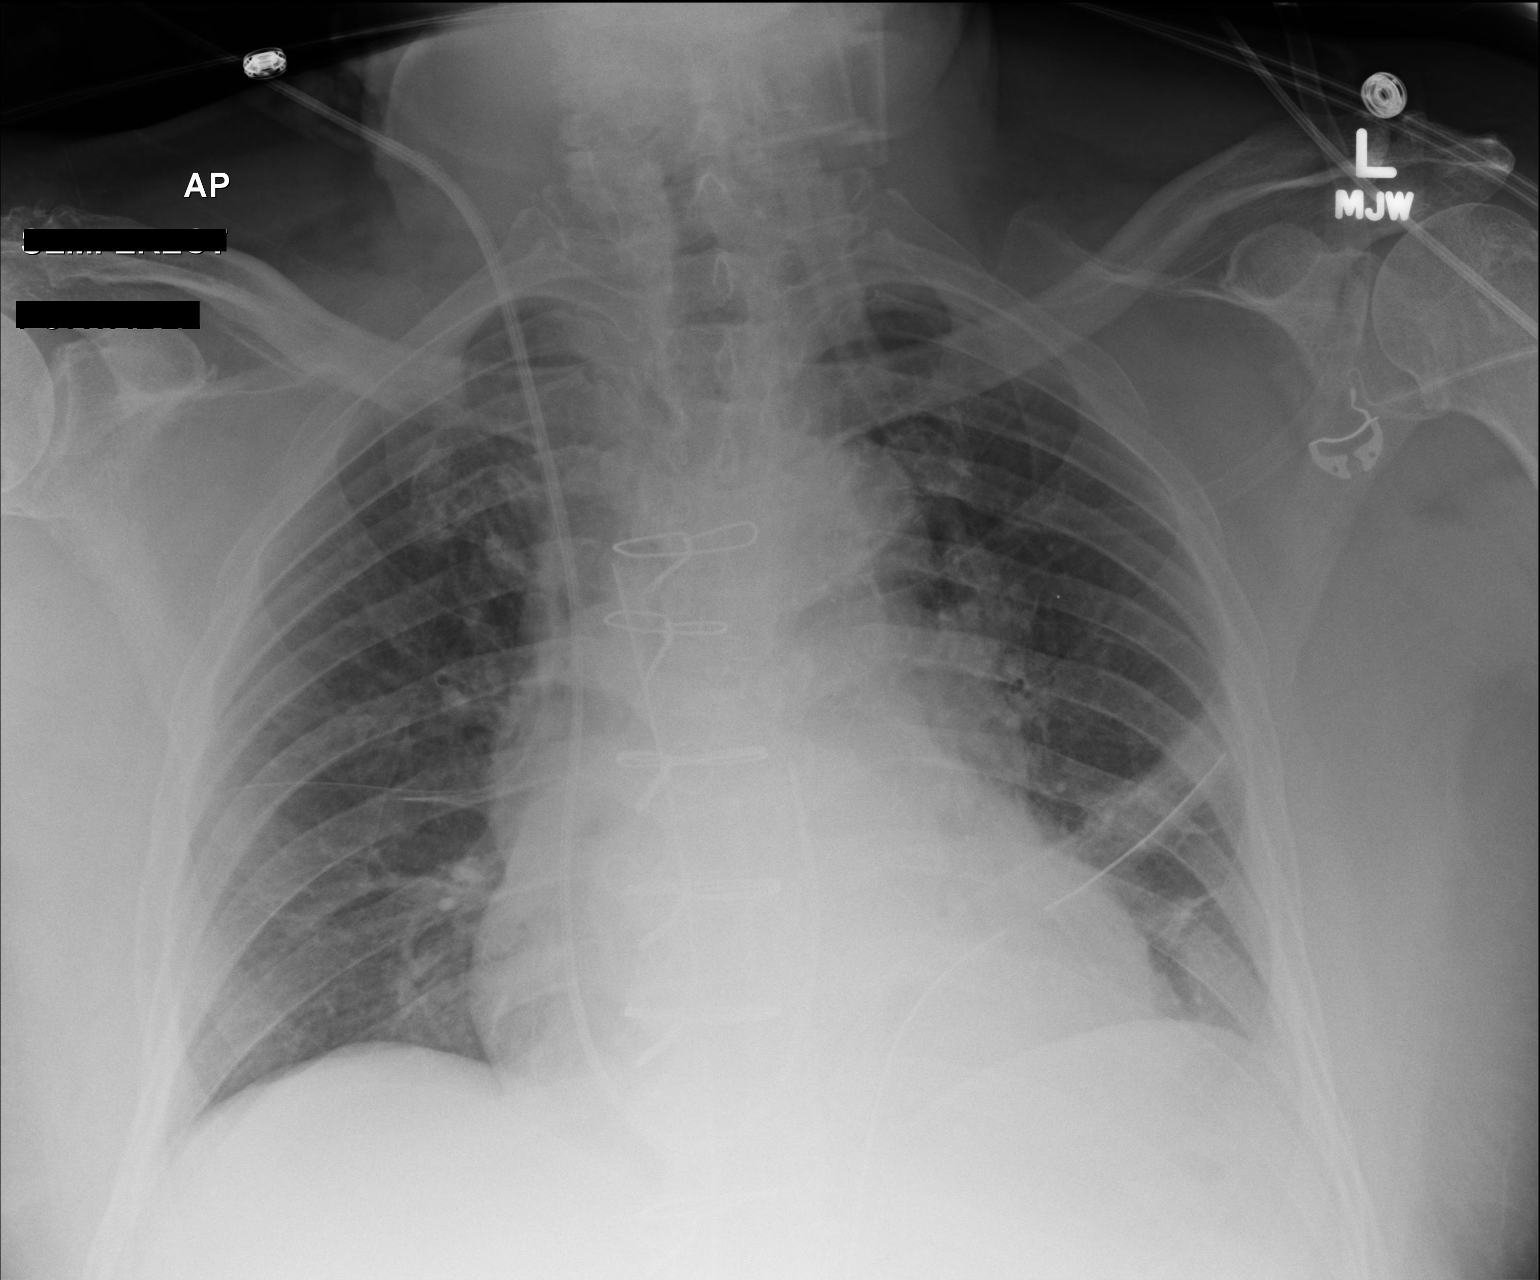

[1 of 1 positions shown; findings below may reference images not displayed]

FINDINGS: Interim extubation. NG tube is been removed. Left chest tube in
stable position. Swan-Ganz catheter tip noted at the level of the
origin of the main pulmonary artery. Mediastinal drains catheter
stable position no pneumothorax. Minimal atelectasis lung bases.
Prior CABG Cardiomegaly. No congestive heart failure.
IMPRESSION: 1. Interim extubation and removal of NG tube.
2. Swan-Ganz catheter tip is projected over the origin of the main
pulmonary artery. Mediastinal drainage catheter in stable position.
Left chest tube in stable position. No pneumothorax.
3. Prior CABG. No congestive heart failure. Mild atelectasis left
lung base.

## 2014-12-22 IMAGING — CR DG CHEST 2V
2 series · 2 of 2 positions shown · non-contrast
Comparison: 01/08/2013

CLINICAL DATA: Atelectasis.  Recent CABG.

EXAM:
CHEST  2 VIEW

[w chest pa]
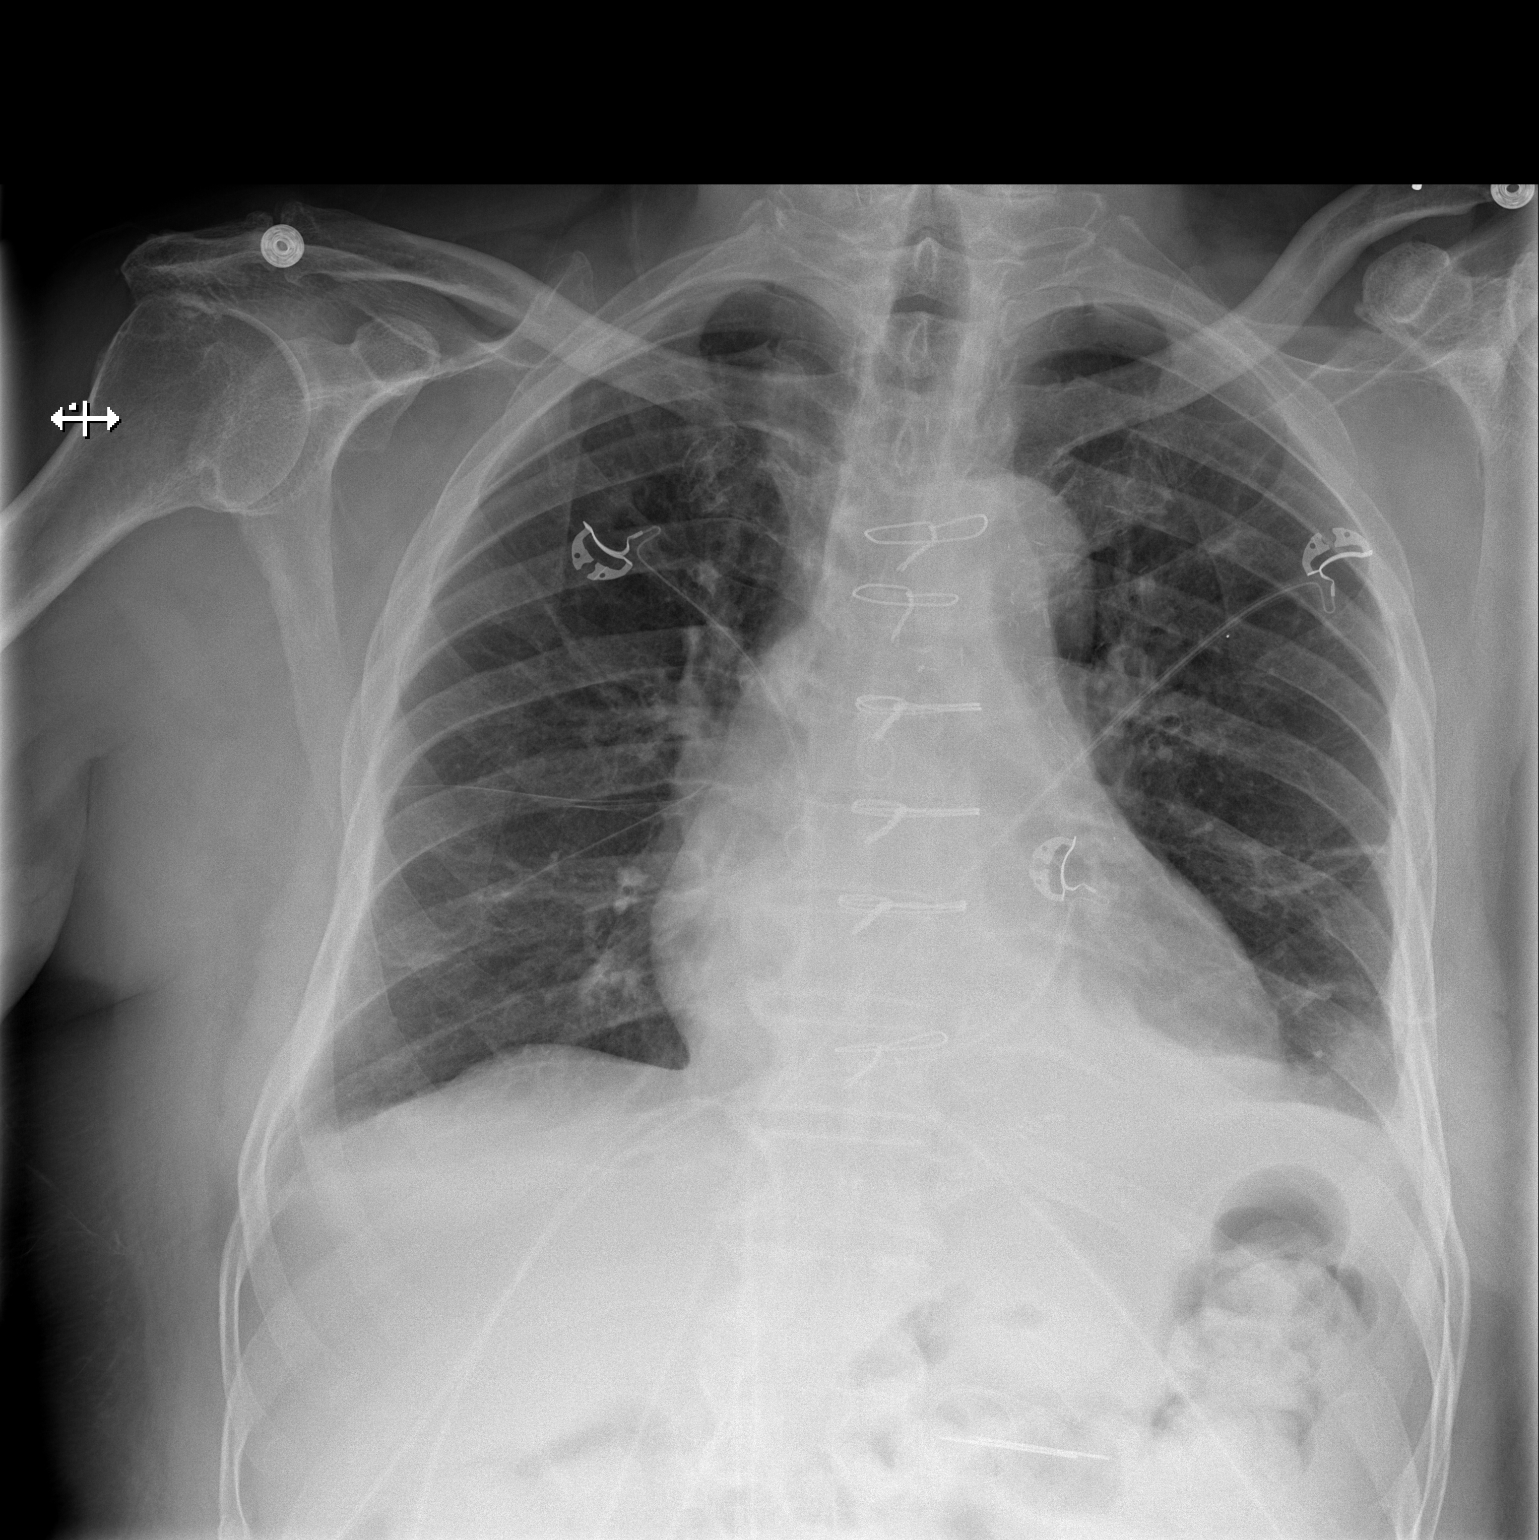

[w chest lat]
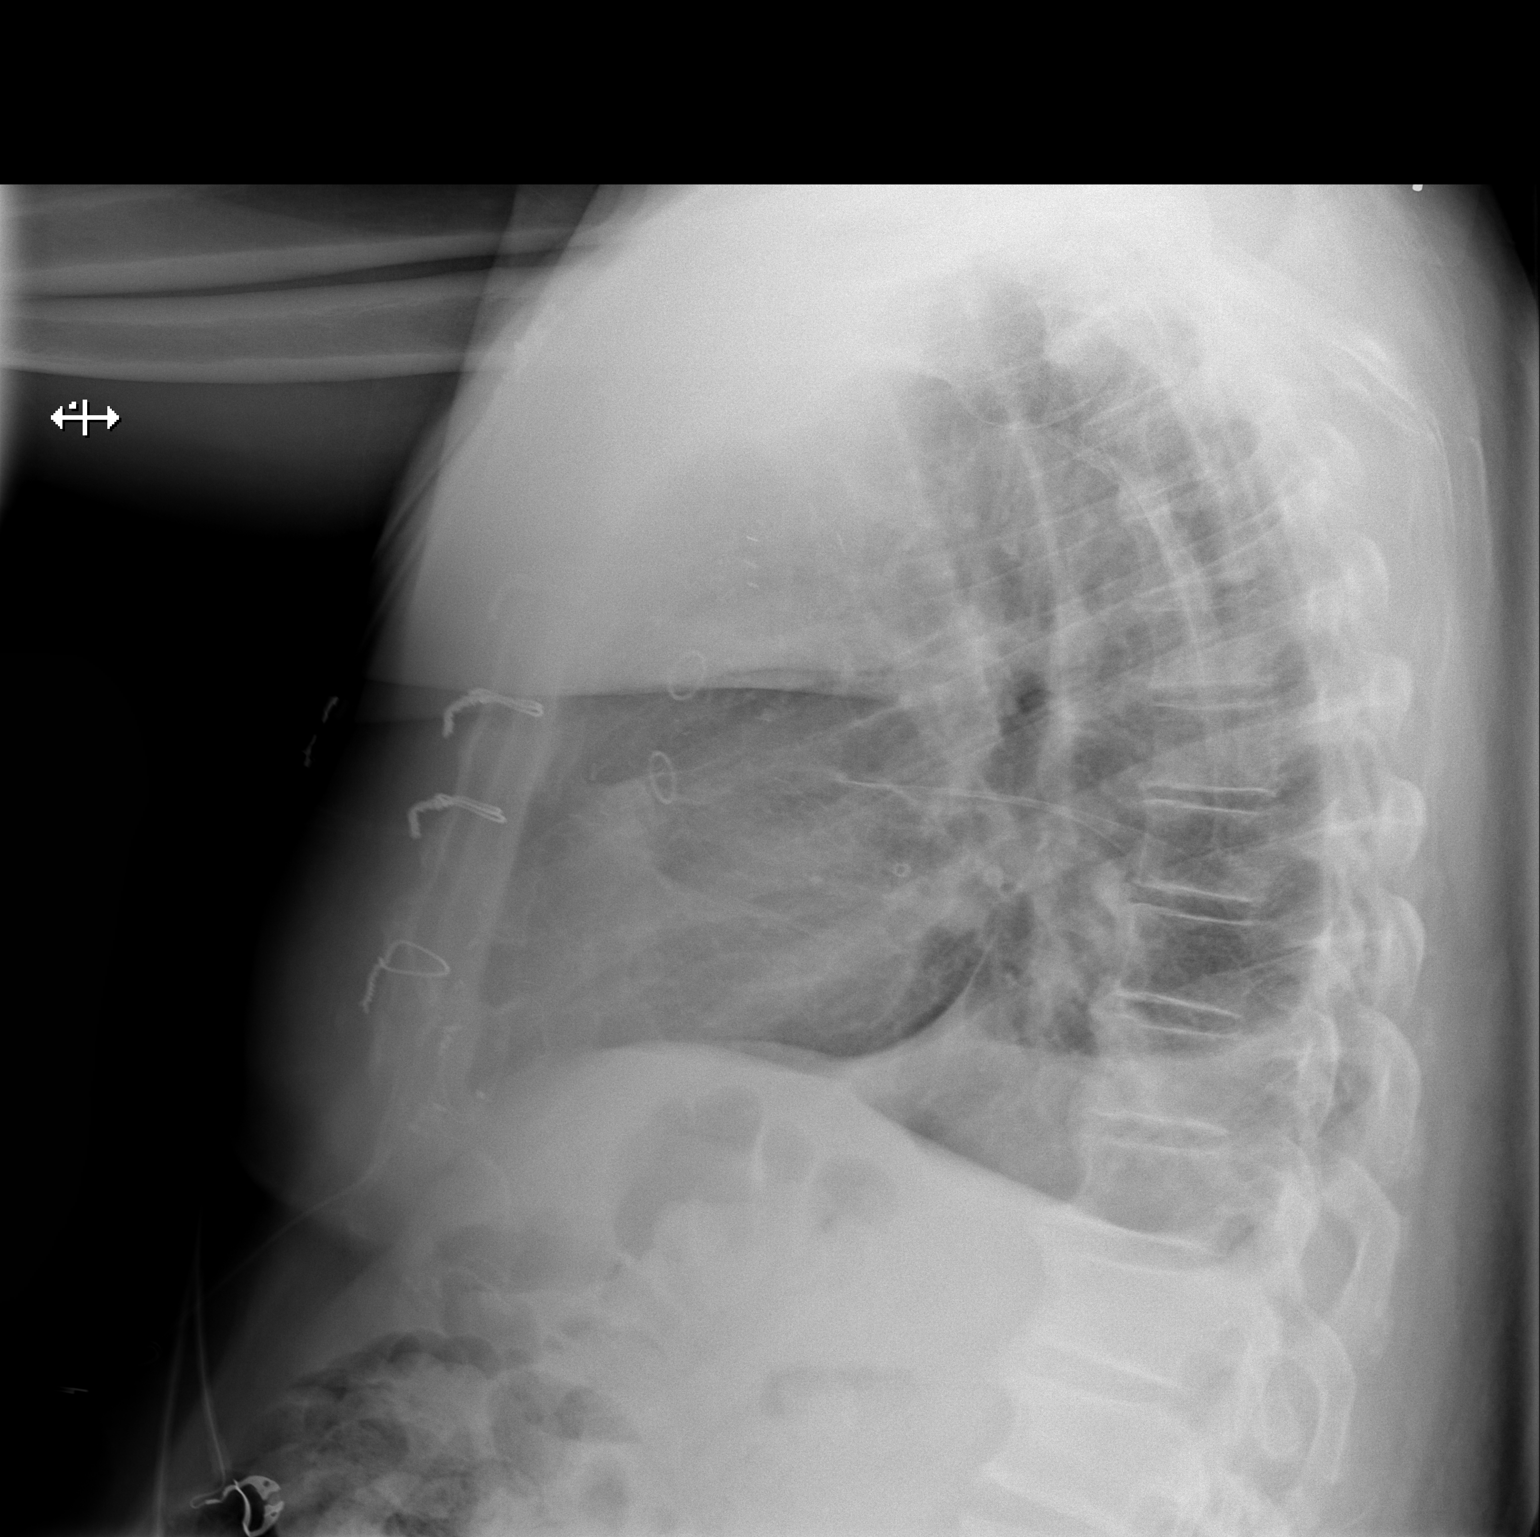

[2 of 2 positions shown; findings below may reference images not displayed]

FINDINGS: There is a tiny right effusion and a small left effusion. Minimal
atelectasis at the left base has almost resolved.

Heart size and pulmonary vascularity are normal.  No pneumothorax.
IMPRESSION: Small bilateral effusions. Atelectasis at the left base has almost
resolved.

## 2014-12-25 ENCOUNTER — Ambulatory Visit (INDEPENDENT_AMBULATORY_CARE_PROVIDER_SITE_OTHER): Payer: Medicare Other | Admitting: Endocrinology

## 2014-12-25 ENCOUNTER — Encounter: Payer: Self-pay | Admitting: Endocrinology

## 2014-12-25 DIAGNOSIS — Z794 Long term (current) use of insulin: Secondary | ICD-10-CM | POA: Diagnosis not present

## 2014-12-25 DIAGNOSIS — E1165 Type 2 diabetes mellitus with hyperglycemia: Secondary | ICD-10-CM | POA: Diagnosis not present

## 2014-12-25 LAB — POCT GLUCOSE (DEVICE FOR HOME USE): GLUCOSE FASTING, POC: 147 mg/dL — AB (ref 70–99)
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
## 2014-12-25 NOTE — Patient Instructions (Signed)
Check blood sugars on waking up 3-4  times a week Also check blood sugars about 2-3 hours after a meal and do this after different meals by rotation  Recommended blood sugar levels on waking up is 90-130 and about 2 hours after meal is 130-160  Please bring your blood sugar monitor to each visit, thank you  Stay on same doses of 20N in am and Regular 4--5--7 at meals

## 2015-01-03 ENCOUNTER — Other Ambulatory Visit: Payer: Self-pay | Admitting: Cardiovascular Disease

## 2015-01-07 ENCOUNTER — Other Ambulatory Visit: Payer: Self-pay | Admitting: Cardiovascular Disease

## 2015-01-22 ENCOUNTER — Telehealth: Payer: Self-pay | Admitting: Endocrinology

## 2015-01-22 NOTE — Telephone Encounter (Signed)
Faxed

## 2015-01-22 NOTE — Telephone Encounter (Signed)
Dr Dahlia Bailiff office need patient last office notes Fax # [phone]

## 2015-02-23 ENCOUNTER — Other Ambulatory Visit (INDEPENDENT_AMBULATORY_CARE_PROVIDER_SITE_OTHER): Payer: Medicare HMO

## 2015-02-23 ENCOUNTER — Encounter: Payer: Medicare HMO | Admitting: Endocrinology

## 2015-02-23 ENCOUNTER — Encounter: Payer: Self-pay | Admitting: Endocrinology

## 2015-02-23 DIAGNOSIS — IMO0002 Reserved for concepts with insufficient information to code with codable children: Secondary | ICD-10-CM

## 2015-02-23 DIAGNOSIS — E1165 Type 2 diabetes mellitus with hyperglycemia: Secondary | ICD-10-CM

## 2015-02-23 LAB — URINALYSIS, ROUTINE W REFLEX MICROSCOPIC
BILIRUBIN URINE: NEGATIVE
HGB URINE DIPSTICK: NEGATIVE
Ketones, ur: NEGATIVE
LEUKOCYTES UA: NEGATIVE
Nitrite: NEGATIVE
Specific Gravity, Urine: 1.01 (ref 1.000–1.030)
TOTAL PROTEIN, URINE-UPE24: NEGATIVE
URINE GLUCOSE: NEGATIVE
UROBILINOGEN UA: 0.2 (ref 0.0–1.0)
pH: 6.5 (ref 5.0–8.0)

## 2015-02-23 LAB — COMPREHENSIVE METABOLIC PANEL
ALBUMIN: 3.8 g/dL (ref 3.5–5.2)
ALT: 13 U/L (ref 0–53)
AST: 15 U/L (ref 0–37)
Alkaline Phosphatase: 85 U/L (ref 39–117)
BUN: 24 mg/dL — AB (ref 6–23)
CHLORIDE: 106 meq/L (ref 96–112)
CO2: 25 meq/L (ref 19–32)
CREATININE: 1.15 mg/dL (ref 0.40–1.50)
Calcium: 9.1 mg/dL (ref 8.4–10.5)
GFR: 78.42 mL/min (ref >60.00)
GLUCOSE: 213 mg/dL — AB (ref 70–99)
Potassium: 4 mEq/L (ref 3.5–5.1)
Sodium: 139 mEq/L (ref 135–145)
Total Bilirubin: 0.5 mg/dL (ref 0.2–1.2)
Total Protein: 7.1 g/dL (ref 6.0–8.3)

## 2015-02-23 LAB — MICROALBUMIN / CREATININE URINE RATIO
Creatinine,U: 82.6 mg/dL
Microalb Creat Ratio: 5.4 mg/g (ref 0.0–30.0)
Microalb, Ur: 4.5 mg/dL — ABNORMAL HIGH (ref 0.0–1.9)

## 2015-02-23 LAB — HEMOGLOBIN A1C: Hgb A1c MFr Bld: 7.3 % — ABNORMAL HIGH (ref 4.6–6.5)

## 2015-02-24 NOTE — Progress Notes (Signed)
This encounter was created in error - please disregard.

## 2015-02-27 ENCOUNTER — Other Ambulatory Visit: Payer: Self-pay | Admitting: Endocrinology

## 2015-03-07 ENCOUNTER — Ambulatory Visit (INDEPENDENT_AMBULATORY_CARE_PROVIDER_SITE_OTHER): Payer: Medicare HMO | Admitting: Endocrinology

## 2015-03-07 ENCOUNTER — Other Ambulatory Visit: Payer: Self-pay | Admitting: *Deleted

## 2015-03-07 ENCOUNTER — Encounter: Payer: Self-pay | Admitting: Endocrinology

## 2015-03-07 VITALS — BP 122/76 | HR 69 | Temp 97.9°F | Resp 16 | Ht 71.0 in | Wt 263.2 lb

## 2015-03-07 DIAGNOSIS — Z794 Long term (current) use of insulin: Secondary | ICD-10-CM | POA: Diagnosis not present

## 2015-03-07 DIAGNOSIS — E1165 Type 2 diabetes mellitus with hyperglycemia: Secondary | ICD-10-CM | POA: Diagnosis not present

## 2015-03-07 LAB — POCT URINALYSIS DIPSTICK
Blood, UA: NEGATIVE
GLUCOSE UA: NEGATIVE
KETONES UA: NEGATIVE
NITRITE UA: POSITIVE
Spec Grav, UA: 1.01
Urobilinogen, UA: 0.2
pH, UA: 6.5

## 2015-03-07 MED ORDER — CIPROFLOXACIN HCL 500 MG PO TABS: 500.0000 mg | ORAL_TABLET | Freq: Two times a day (BID) | ORAL | Status: DC
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
## 2015-03-07 NOTE — Patient Instructions (Signed)
Take REGULAR INSULIN 5 UNITS before breakfast with 24 units of CLOUDY insulin  Take 6 Regular at lunch and 8 before supper  Check blood sugars on waking up 3  times a week Also check blood sugars about 2 hours after a meal and do this after different meals by rotation  Recommended blood sugar levels on waking up is 90-130 and about 2 hours after meal is 130-160  Please bring your blood sugar monitor to each visit, thank you

## 2015-03-19 ENCOUNTER — Ambulatory Visit (INDEPENDENT_AMBULATORY_CARE_PROVIDER_SITE_OTHER): Payer: Medicare HMO | Admitting: Cardiovascular Disease

## 2015-03-19 ENCOUNTER — Encounter: Payer: Self-pay | Admitting: Cardiovascular Disease

## 2015-03-19 VITALS — BP 130/70 | HR 68 | Ht 71.0 in | Wt 266.0 lb

## 2015-03-19 DIAGNOSIS — E7800 Pure hypercholesterolemia, unspecified: Secondary | ICD-10-CM | POA: Diagnosis not present

## 2015-03-19 DIAGNOSIS — I10 Essential (primary) hypertension: Secondary | ICD-10-CM | POA: Diagnosis not present

## 2015-03-19 DIAGNOSIS — I2510 Atherosclerotic heart disease of native coronary artery without angina pectoris: Secondary | ICD-10-CM | POA: Diagnosis not present

## 2015-03-19 LAB — LIPID PANEL
CHOLESTEROL: 111 mg/dL — AB (ref 125–200)
HDL: 38 mg/dL — AB (ref >=40)
LDL Cholesterol: 55 mg/dL (ref <130)
TRIGLYCERIDES: 90 mg/dL (ref <150)
Total CHOL/HDL Ratio: 2.9 Ratio (ref <=5.0)
VLDL: 18 mg/dL (ref <30)
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
## 2015-03-19 NOTE — Patient Instructions (Signed)

## 2015-04-19 ENCOUNTER — Telehealth: Payer: Self-pay | Admitting: Cardiovascular Disease

## 2015-04-19 DIAGNOSIS — R0600 Dyspnea, unspecified: Secondary | ICD-10-CM

## 2015-04-19 NOTE — Telephone Encounter (Signed)
Spoke with patient who states he is getting "short-winded" when he does a lot of work; states he feels it in the middle of his chest.  Complains of "tightness" in chest and/or abdomen, states is not associated with eating.  States if he lays down it doesn't bother him as much.  Denies diaphoresis/nausea, extremity swelling.  States he has poor circulation in his legs but no swelling that he has noticed.  States symptoms occur daily.  States he has taken NTG but doesn't remember when last pill was taken.  He requests an appointment.  Earliest available appointment is Tuesday 3/21.  I advised him that I will forward message to Dr. Acie Fredrickson for advice and will call him back.

## 2015-04-19 NOTE — Telephone Encounter (Signed)
He has not had an echo or myoview since his CABG in 2014. Lets get an echo and Lexiscan myoview to see if he has worsening CAD or CHF This may be a viral infection / flu and he should also check with his medical doctor

## 2015-04-19 NOTE — Telephone Encounter (Signed)
F/u--- Mr. Furia is following to his call from earlier

## 2015-04-19 NOTE — Telephone Encounter (Signed)
New Message  Pt c/o Shortness Of Breath: STAT if SOB developed within the last 24 hours or pt is noticeably SOB on the phone  1. Are you currently SOB (can you hear that pt is SOB on the phone)? Yes  2. How long have you been experiencing SOB? 1 week  3. Are you SOB when sitting or when up moving around? Both ( whenever she tries to hurry to do something her breath gets short)  4.  Are you currently experiencing any other symptoms? Her stomach is swelling and feels as if it is getting tight

## 2015-04-19 NOTE — Telephone Encounter (Signed)
Spoke with patient and reviewed Dr. Elmarie Shiley advice.  He is scheduled for lexiscan myoview for Tuesday 3/21.  I advised him if pain or discomfort increases prior to appointment Tuesday, to go to [facility] ED.  First available appointment for ECHO is 3/30.  Patient verbalized understanding and agreement with plan.

## 2015-04-24 ENCOUNTER — Encounter (HOSPITAL_COMMUNITY): Payer: Medicare HMO

## 2015-04-25 ENCOUNTER — Ambulatory Visit (HOSPITAL_COMMUNITY): Payer: Medicare HMO | Attending: Cardiology

## 2015-04-25 DIAGNOSIS — R079 Chest pain, unspecified: Secondary | ICD-10-CM | POA: Insufficient documentation

## 2015-04-25 DIAGNOSIS — I10 Essential (primary) hypertension: Secondary | ICD-10-CM | POA: Insufficient documentation

## 2015-04-25 DIAGNOSIS — R0600 Dyspnea, unspecified: Secondary | ICD-10-CM | POA: Insufficient documentation

## 2015-04-25 DIAGNOSIS — R9439 Abnormal result of other cardiovascular function study: Secondary | ICD-10-CM | POA: Insufficient documentation

## 2015-04-25 DIAGNOSIS — I739 Peripheral vascular disease, unspecified: Secondary | ICD-10-CM | POA: Insufficient documentation

## 2015-04-25 DIAGNOSIS — E109 Type 1 diabetes mellitus without complications: Secondary | ICD-10-CM | POA: Insufficient documentation

## 2015-04-25 DIAGNOSIS — R0609 Other forms of dyspnea: Secondary | ICD-10-CM | POA: Insufficient documentation

## 2015-04-25 LAB — MYOCARDIAL PERFUSION IMAGING
CHL CUP NUCLEAR SDS: 1
CHL CUP RESTING HR STRESS: 69 {beats}/min
LVDIAVOL: 178 mL (ref 62–150)
LVSYSVOL: 103 mL
Peak HR: 87 {beats}/min
RATE: 0.33
SRS: 17
SSS: 18
TID: 1.08

## 2015-04-25 MED ORDER — TECHNETIUM TC 99M SESTAMIBI GENERIC - CARDIOLITE
10.4000 | Freq: Once | INTRAVENOUS | Status: AC | PRN
  Administered 2015-04-25: 10 via INTRAVENOUS

## 2015-04-25 MED ORDER — REGADENOSON 0.4 MG/5ML IV SOLN
0.4000 mg | Freq: Once | INTRAVENOUS | Status: AC
  Administered 2015-04-25: 0.4 mg via INTRAVENOUS

## 2015-04-25 MED ORDER — TECHNETIUM TC 99M SESTAMIBI GENERIC - CARDIOLITE
32.6000 | Freq: Once | INTRAVENOUS | Status: AC | PRN
  Administered 2015-04-25: 33 via INTRAVENOUS

## 2015-04-28 ENCOUNTER — Other Ambulatory Visit: Payer: Self-pay | Admitting: Endocrinology

## 2015-05-03 ENCOUNTER — Ambulatory Visit (HOSPITAL_COMMUNITY): Payer: Medicare HMO | Attending: Cardiovascular Disease

## 2015-05-03 ENCOUNTER — Other Ambulatory Visit: Payer: Self-pay

## 2015-05-03 DIAGNOSIS — I340 Nonrheumatic mitral (valve) insufficiency: Secondary | ICD-10-CM | POA: Insufficient documentation

## 2015-05-03 DIAGNOSIS — Z87891 Personal history of nicotine dependence: Secondary | ICD-10-CM | POA: Diagnosis not present

## 2015-05-03 DIAGNOSIS — I2510 Atherosclerotic heart disease of native coronary artery without angina pectoris: Secondary | ICD-10-CM | POA: Insufficient documentation

## 2015-05-03 DIAGNOSIS — Z951 Presence of aortocoronary bypass graft: Secondary | ICD-10-CM | POA: Diagnosis not present

## 2015-05-03 DIAGNOSIS — I252 Old myocardial infarction: Secondary | ICD-10-CM | POA: Insufficient documentation

## 2015-05-03 DIAGNOSIS — I509 Heart failure, unspecified: Secondary | ICD-10-CM | POA: Diagnosis not present

## 2015-05-03 DIAGNOSIS — Z8546 Personal history of malignant neoplasm of prostate: Secondary | ICD-10-CM | POA: Diagnosis not present

## 2015-05-03 DIAGNOSIS — R0600 Dyspnea, unspecified: Secondary | ICD-10-CM

## 2015-05-03 DIAGNOSIS — I110 Hypertensive heart disease with heart failure: Secondary | ICD-10-CM | POA: Insufficient documentation

## 2015-05-03 DIAGNOSIS — E119 Type 2 diabetes mellitus without complications: Secondary | ICD-10-CM | POA: Insufficient documentation

## 2015-05-03 DIAGNOSIS — E785 Hyperlipidemia, unspecified: Secondary | ICD-10-CM | POA: Insufficient documentation

## 2015-07-24 ENCOUNTER — Other Ambulatory Visit: Payer: Self-pay | Admitting: Endocrinology

## 2015-08-06 ENCOUNTER — Emergency Department (HOSPITAL_COMMUNITY)
Admission: EM | Admit: 2015-08-06 | Discharge: 2015-08-06 | Disposition: A | Discharge status: Home / Self Care | Payer: Medicare HMO | Attending: Emergency Medicine | Admitting: Emergency Medicine

## 2015-08-06 ENCOUNTER — Encounter (HOSPITAL_COMMUNITY): Payer: Self-pay | Admitting: Emergency Medicine

## 2015-08-06 DIAGNOSIS — Z8546 Personal history of malignant neoplasm of prostate: Secondary | ICD-10-CM | POA: Insufficient documentation

## 2015-08-06 DIAGNOSIS — Z951 Presence of aortocoronary bypass graft: Secondary | ICD-10-CM | POA: Insufficient documentation

## 2015-08-06 DIAGNOSIS — Z87891 Personal history of nicotine dependence: Secondary | ICD-10-CM | POA: Diagnosis not present

## 2015-08-06 DIAGNOSIS — I252 Old myocardial infarction: Secondary | ICD-10-CM | POA: Insufficient documentation

## 2015-08-06 DIAGNOSIS — Z794 Long term (current) use of insulin: Secondary | ICD-10-CM | POA: Insufficient documentation

## 2015-08-06 DIAGNOSIS — Z7982 Long term (current) use of aspirin: Secondary | ICD-10-CM | POA: Diagnosis not present

## 2015-08-06 DIAGNOSIS — Z79899 Other long term (current) drug therapy: Secondary | ICD-10-CM | POA: Diagnosis not present

## 2015-08-06 DIAGNOSIS — I10 Essential (primary) hypertension: Secondary | ICD-10-CM | POA: Diagnosis not present

## 2015-08-06 DIAGNOSIS — I2510 Atherosclerotic heart disease of native coronary artery without angina pectoris: Secondary | ICD-10-CM | POA: Insufficient documentation

## 2015-08-06 DIAGNOSIS — E119 Type 2 diabetes mellitus without complications: Secondary | ICD-10-CM | POA: Diagnosis not present

## 2015-08-06 DIAGNOSIS — Z7984 Long term (current) use of oral hypoglycemic drugs: Secondary | ICD-10-CM | POA: Diagnosis not present

## 2015-08-06 DIAGNOSIS — R0981 Nasal congestion: Secondary | ICD-10-CM | POA: Diagnosis present

## 2015-08-06 LAB — URINALYSIS, ROUTINE W REFLEX MICROSCOPIC
Bilirubin Urine: NEGATIVE
Glucose, UA: NEGATIVE mg/dL
Ketones, ur: NEGATIVE mg/dL
Nitrite: POSITIVE — AB
PROTEIN: 30 mg/dL — AB
Specific Gravity, Urine: 1.021 (ref 1.005–1.030)
pH: 5 (ref 5.0–8.0)

## 2015-08-06 LAB — CBC
HCT: 44.4 % (ref 39.0–52.0)
Hemoglobin: 14.5 g/dL (ref 13.0–17.0)
MCH: 30.5 pg (ref 26.0–34.0)
MCHC: 32.7 g/dL (ref 30.0–36.0)
MCV: 93.5 fL (ref 78.0–100.0)
PLATELETS: 199 10*3/uL (ref 150–400)
RBC: 4.75 MIL/uL (ref 4.22–5.81)
RDW: 13 % (ref 11.5–15.5)
WBC: 11.6 10*3/uL — AB (ref 4.0–10.5)

## 2015-08-06 LAB — BASIC METABOLIC PANEL
ANION GAP: 7 (ref 5–15)
BUN: 16 mg/dL (ref 6–20)
CALCIUM: 9.4 mg/dL (ref 8.9–10.3)
CHLORIDE: 108 mmol/L (ref 101–111)
CO2: 23 mmol/L (ref 22–32)
CREATININE: 1.3 mg/dL — AB (ref 0.61–1.24)
GFR calc non Af Amer: 50 mL/min — ABNORMAL LOW (ref >60)
GFR, EST AFRICAN AMERICAN: 58 mL/min — AB (ref >60)
Glucose, Bld: 108 mg/dL — ABNORMAL HIGH (ref 65–99)
Potassium: 3.8 mmol/L (ref 3.5–5.1)
SODIUM: 138 mmol/L (ref 135–145)

## 2015-08-06 LAB — URINE MICROSCOPIC-ADD ON

## 2015-08-06 LAB — CBG MONITORING, ED: Glucose-Capillary: 108 mg/dL — ABNORMAL HIGH (ref 65–99)

## 2015-08-06 NOTE — ED Notes (Signed)
D/C instructions reviewed with pt. And his wife. They both deny questions. Pt. Wheeled out of hospital to leave with wife.

## 2015-08-06 NOTE — Discharge Instructions (Signed)
Use your Flonase nasal spray one in each nostril, one or 2 times a day for at least 3 weeks.  Take Tylenol if needed, for pain.

## 2015-08-06 NOTE — ED Notes (Signed)
p-t. Stated, My eyes feel like i can't see real good.
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
## 2015-08-06 NOTE — ED Notes (Signed)
Pt. Stated, I only check my sugar at night and I took my Insulin this morning and I've been dizzy since this morning. This has never happened before.

## 2015-09-19 ENCOUNTER — Encounter: Payer: Self-pay | Admitting: Cardiovascular Disease

## 2015-09-19 ENCOUNTER — Ambulatory Visit (INDEPENDENT_AMBULATORY_CARE_PROVIDER_SITE_OTHER): Payer: Medicare HMO | Admitting: Cardiovascular Disease

## 2015-09-19 VITALS — BP 140/80 | HR 66 | Ht 67.0 in | Wt 270.4 lb

## 2015-09-19 DIAGNOSIS — I10 Essential (primary) hypertension: Secondary | ICD-10-CM | POA: Diagnosis not present

## 2015-09-19 DIAGNOSIS — I2510 Atherosclerotic heart disease of native coronary artery without angina pectoris: Secondary | ICD-10-CM | POA: Diagnosis not present

## 2015-09-19 DIAGNOSIS — I739 Peripheral vascular disease, unspecified: Secondary | ICD-10-CM | POA: Diagnosis not present

## 2015-09-19 NOTE — Progress Notes (Signed)
[name] [name], there really are no good surgical options. He has 0 vessel run off below the knee bilaterally. Can try Pletal.

## 2015-09-19 NOTE — Patient Instructions (Signed)
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
## 2015-09-29 ENCOUNTER — Other Ambulatory Visit: Payer: Self-pay | Admitting: Cardiovascular Disease

## 2015-10-01 NOTE — Telephone Encounter (Signed)
Rx request sent to pharmacy.  

## 2016-01-31 ENCOUNTER — Other Ambulatory Visit: Payer: Self-pay | Admitting: Cardiovascular Disease

## 2016-03-13 ENCOUNTER — Encounter: Payer: Self-pay | Admitting: Cardiovascular Disease

## 2016-03-17 ENCOUNTER — Ambulatory Visit (INDEPENDENT_AMBULATORY_CARE_PROVIDER_SITE_OTHER): Payer: Medicare Other | Admitting: Cardiovascular Disease

## 2016-03-17 ENCOUNTER — Encounter (INDEPENDENT_AMBULATORY_CARE_PROVIDER_SITE_OTHER): Payer: Self-pay

## 2016-03-17 ENCOUNTER — Encounter: Payer: Self-pay | Admitting: Cardiovascular Disease

## 2016-03-17 VITALS — BP 140/80 | HR 64 | Ht 74.0 in | Wt 274.8 lb

## 2016-03-17 DIAGNOSIS — I2510 Atherosclerotic heart disease of native coronary artery without angina pectoris: Secondary | ICD-10-CM

## 2016-03-17 DIAGNOSIS — I10 Essential (primary) hypertension: Secondary | ICD-10-CM | POA: Diagnosis not present

## 2016-03-17 MED ORDER — HYDROCHLOROTHIAZIDE 25 MG PO TABS: 25.0000 mg | ORAL_TABLET | Freq: Every day | ORAL | 11 refills | Status: DC

## 2016-03-17 MED ORDER — POTASSIUM CHLORIDE ER 10 MEQ PO TBCR: 10.0000 meq | EXTENDED_RELEASE_TABLET | Freq: Every day | ORAL | 11 refills | Status: DC

## 2016-03-17 NOTE — Patient Instructions (Signed)
Medication Instructions:  START HCTZ (Hydrochlorothiazide) 25 mg once daily in the morning START Kdur (Potassium supplement) 10 meq once daily in the morning   Labwork: Your physician recommends that you return for lab work in: 3 weeks for fasting lab work (cholesterol, complete metabolic panel)   Testing/Procedures: None Ordered   Follow-Up: Your physician wants you to follow-up in: 6 months with Dr. Acie Fredrickson.  You will receive a reminder letter in the mail two months in advance. If you don't receive a letter, please call our office to schedule the follow-up appointment.   If you need a refill on your cardiac medications before your next appointment, please call your pharmacy.   Thank you for choosing [facility]! Christen Bame, RN [phone]
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
## 2016-04-07 ENCOUNTER — Other Ambulatory Visit: Payer: Medicare Other

## 2016-04-08 ENCOUNTER — Other Ambulatory Visit (INDEPENDENT_AMBULATORY_CARE_PROVIDER_SITE_OTHER): Payer: Medicare Other

## 2016-04-08 DIAGNOSIS — I10 Essential (primary) hypertension: Secondary | ICD-10-CM

## 2016-04-08 DIAGNOSIS — I2510 Atherosclerotic heart disease of native coronary artery without angina pectoris: Secondary | ICD-10-CM

## 2016-04-08 LAB — COMPREHENSIVE METABOLIC PANEL
A/G RATIO: 1.3 (ref 1.2–2.2)
ALT: 19 IU/L (ref 0–44)
AST: 22 IU/L (ref 0–40)
Albumin: 4.2 g/dL (ref 3.5–4.7)
Alkaline Phosphatase: 110 IU/L (ref 39–117)
BILIRUBIN TOTAL: 0.5 mg/dL (ref 0.0–1.2)
BUN/Creatinine Ratio: 18 (ref 10–24)
BUN: 24 mg/dL (ref 8–27)
CALCIUM: 9.6 mg/dL (ref 8.6–10.2)
CHLORIDE: 103 mmol/L (ref 96–106)
CO2: 23 mmol/L (ref 18–29)
Creatinine, Ser: 1.34 mg/dL — ABNORMAL HIGH (ref 0.76–1.27)
GFR, EST AFRICAN AMERICAN: 57 mL/min/{1.73_m2} — AB (ref >59)
GFR, EST NON AFRICAN AMERICAN: 49 mL/min/{1.73_m2} — AB (ref >59)
GLUCOSE: 169 mg/dL — AB (ref 65–99)
Globulin, Total: 3.2 g/dL (ref 1.5–4.5)
POTASSIUM: 4.5 mmol/L (ref 3.5–5.2)
Sodium: 143 mmol/L (ref 134–144)
TOTAL PROTEIN: 7.4 g/dL (ref 6.0–8.5)

## 2016-04-08 LAB — LIPID PANEL
CHOL/HDL RATIO: 3.1 ratio (ref 0.0–5.0)
Cholesterol, Total: 116 mg/dL (ref 100–199)
HDL: 38 mg/dL — ABNORMAL LOW (ref >39)
LDL CALC: 62 mg/dL (ref 0–99)
TRIGLYCERIDES: 81 mg/dL (ref 0–149)
VLDL Cholesterol Cal: 16 mg/dL (ref 5–40)

## 2016-07-11 ENCOUNTER — Other Ambulatory Visit: Payer: Self-pay | Admitting: *Deleted

## 2016-07-11 MED ORDER — ATORVASTATIN CALCIUM 40 MG PO TABS: 40.0000 mg | ORAL_TABLET | Freq: Every day | ORAL | 2 refills | Status: DC

## 2016-10-11 ENCOUNTER — Other Ambulatory Visit: Payer: Self-pay | Admitting: Cardiovascular Disease

## 2016-10-13 NOTE — Telephone Encounter (Signed)
°*  STAT* If patient is at the pharmacy, call can be transferred to refill team.   1. Which medications need to be refilled? (please list name of each medication and dose if known) cilostozal  50mg  2xday 2. Which pharmacy/location (including street and city if local pharmacy) is medication to be sent to? [facility] on [address]   3. Do they need a 30 day or 90 day supply? 39  Pt said he hand the pharmacy has sent over many request please call pt

## 2016-10-31 ENCOUNTER — Encounter: Payer: Self-pay | Admitting: Cardiovascular Disease

## 2016-10-31 ENCOUNTER — Ambulatory Visit (INDEPENDENT_AMBULATORY_CARE_PROVIDER_SITE_OTHER): Payer: Medicare Other | Admitting: Cardiovascular Disease

## 2016-10-31 VITALS — BP 116/64 | HR 74 | Resp 16 | Ht 71.0 in | Wt 257.4 lb

## 2016-10-31 DIAGNOSIS — I5022 Chronic systolic (congestive) heart failure: Secondary | ICD-10-CM | POA: Diagnosis not present

## 2016-10-31 DIAGNOSIS — E782 Mixed hyperlipidemia: Secondary | ICD-10-CM

## 2016-10-31 DIAGNOSIS — I2510 Atherosclerotic heart disease of native coronary artery without angina pectoris: Secondary | ICD-10-CM | POA: Diagnosis not present

## 2016-10-31 DIAGNOSIS — I5042 Chronic combined systolic (congestive) and diastolic (congestive) heart failure: Secondary | ICD-10-CM | POA: Insufficient documentation

## 2016-10-31 DIAGNOSIS — I10 Essential (primary) hypertension: Secondary | ICD-10-CM

## 2016-10-31 HISTORY — DX: Chronic systolic (congestive) heart failure: I50.22

## 2016-10-31 MED ORDER — POTASSIUM CHLORIDE ER 10 MEQ PO TBCR: 10.0000 meq | EXTENDED_RELEASE_TABLET | Freq: Every day | ORAL | 3 refills | Status: DC

## 2016-10-31 MED ORDER — ASPIRIN EC 81 MG PO TBEC: 81.0000 mg | DELAYED_RELEASE_TABLET | Freq: Every day | ORAL | Status: DC

## 2016-10-31 NOTE — Patient Instructions (Addendum)
Medication Instructions:  DECREASE Aspirin to 81 mg once daily   Labwork: TODAY - cholesterol, liver panel, basic metabolic panel   Testing/Procedures: None Ordered   Follow-Up: Your physician wants you to follow-up in: 6 months with Dr. Acie Fredrickson.  You will receive a reminder letter in the mail two months in advance. If you don't receive a letter, please call our office to schedule the follow-up appointment.   If you need a refill on your cardiac medications before your next appointment, please call your pharmacy.   Thank you for choosing [facility]! Christen Bame, RN [phone]   Dr. [facility] [address] [place], [place]  [phone]
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
## 2016-11-01 LAB — HEPATIC FUNCTION PANEL
ALT: 12 IU/L (ref 0–44)
AST: 20 IU/L (ref 0–40)
Albumin: 4.3 g/dL (ref 3.5–4.7)
Alkaline Phosphatase: 105 IU/L (ref 39–117)
BILIRUBIN, DIRECT: 0.16 mg/dL (ref 0.00–0.40)
Bilirubin Total: 0.4 mg/dL (ref 0.0–1.2)
TOTAL PROTEIN: 7.4 g/dL (ref 6.0–8.5)

## 2016-11-01 LAB — BASIC METABOLIC PANEL
BUN / CREAT RATIO: 21 (ref 10–24)
BUN: 28 mg/dL — AB (ref 8–27)
CALCIUM: 9 mg/dL (ref 8.6–10.2)
CHLORIDE: 105 mmol/L (ref 96–106)
CO2: 22 mmol/L (ref 20–29)
CREATININE: 1.36 mg/dL — AB (ref 0.76–1.27)
GFR calc Af Amer: 55 mL/min/{1.73_m2} — ABNORMAL LOW (ref >59)
GFR calc non Af Amer: 48 mL/min/{1.73_m2} — ABNORMAL LOW (ref >59)
GLUCOSE: 87 mg/dL (ref 65–99)
Potassium: 4 mmol/L (ref 3.5–5.2)
Sodium: 142 mmol/L (ref 134–144)

## 2016-11-01 LAB — LIPID PANEL
Chol/HDL Ratio: 2.7 ratio (ref 0.0–5.0)
Cholesterol, Total: 86 mg/dL — ABNORMAL LOW (ref 100–199)
HDL: 32 mg/dL — AB (ref >39)
LDL Calculated: 39 mg/dL (ref 0–99)
Triglycerides: 76 mg/dL (ref 0–149)
VLDL Cholesterol Cal: 15 mg/dL (ref 5–40)

## 2017-01-09 ENCOUNTER — Other Ambulatory Visit: Payer: Self-pay | Admitting: Physician Assistant

## 2017-01-09 MED ORDER — POTASSIUM CHLORIDE ER 10 MEQ PO TBCR: 10.0000 meq | EXTENDED_RELEASE_TABLET | Freq: Every day | ORAL | 1 refills | Status: DC

## 2017-01-09 MED ORDER — HYDROCHLOROTHIAZIDE 25 MG PO TABS: 25.0000 mg | ORAL_TABLET | Freq: Every day | ORAL | 5 refills | Status: DC

## 2017-02-19 ENCOUNTER — Encounter: Payer: Self-pay | Admitting: Dietician

## 2017-02-19 ENCOUNTER — Encounter: Payer: Medicare Other | Attending: Internal Medicine | Admitting: Dietician

## 2017-02-19 DIAGNOSIS — I739 Peripheral vascular disease, unspecified: Secondary | ICD-10-CM | POA: Insufficient documentation

## 2017-02-19 DIAGNOSIS — E118 Type 2 diabetes mellitus with unspecified complications: Secondary | ICD-10-CM

## 2017-02-19 DIAGNOSIS — G629 Polyneuropathy, unspecified: Secondary | ICD-10-CM | POA: Insufficient documentation

## 2017-02-19 DIAGNOSIS — Z713 Dietary counseling and surveillance: Secondary | ICD-10-CM | POA: Insufficient documentation

## 2017-02-19 DIAGNOSIS — Z794 Long term (current) use of insulin: Secondary | ICD-10-CM

## 2017-02-19 DIAGNOSIS — E119 Type 2 diabetes mellitus without complications: Secondary | ICD-10-CM | POA: Insufficient documentation

## 2017-02-19 NOTE — Progress Notes (Signed)
Diabetes Self-Management Education  Visit Type: First/Initial  Appt. Start Time: 1030 Appt. End Time: 1140  02/19/2017  [name], identified by name and date of birth, is a 84 y.o. male with a diagnosis of Diabetes: Type 2. Other history includes PAD, polyneuropathy. UBW 265 lbs 10 years ago with slow weight loss due to diet changes. Medications include Lantus 25 units q am and Humalog 10 units before breakfast 8 units before lunch, and 10 units before dinner.  Patient lives with his wife.  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  His wife does the shopping and cooking.  ASSESSMENT  Height 5\' 7"  (1.702 m), weight 251 lb (113.9 kg). Body mass index is 39.31 kg/m.  Diabetes Self-Management Education - 02/19/17 1057      Visit Information   Visit Type  First/Initial      Initial Visit   Diabetes Type  Type 2    Are you currently following a meal plan?  No    Are you taking your medications as prescribed?  Yes    Date Diagnosed  1980      Health Coping   How would you rate your overall health?  Good      Psychosocial Assessment   Patient Belief/Attitude about Diabetes  Motivated to manage diabetes    Self-care barriers  Hard of hearing;Low literacy    Self-management support  Doctor's office;Family    Other persons present  Patient    Patient Concerns  Nutrition/Meal planning;Glycemic Control;Weight Control    Special Needs  None    Preferred Learning Style  No preference indicated    Learning Readiness  Ready    How often do you need to have someone help you when you read instructions, pamphlets, or other written materials from your doctor or pharmacy?  3 - Sometimes wife helps him    What is the last grade level you completed in school?  12th grade      Pre-Education Assessment   Patient understands the diabetes disease and treatment process.  Needs Review    Patient understands incorporating nutritional management  into lifestyle.  Needs Review    Patient undertands incorporating physical activity into lifestyle.  Needs Review    Patient understands using medications safely.  Needs Review    Patient understands monitoring blood glucose, interpreting and using results  Needs Review    Patient understands prevention, detection, and treatment of acute complications.  Needs Review    Patient understands prevention, detection, and treatment of chronic complications.  Needs Review    Patient understands how to develop strategies to address psychosocial issues.  Needs Review    Patient understands how to develop strategies to promote health/change behavior.  Needs Review      Complications   Last HgB A1C per patient/outside source  7.3 % 02/06/17    How often do you check your blood sugar?  0 times/day (not testing) 2-3    Fasting Blood glucose range (mg/dL)  130-179;180-200;>200;70-129    Postprandial Blood glucose range (mg/dL)  130-179    Number of hypoglycemic episodes per month  2    Can you tell when your blood sugar is low?  Yes    What do you do if your blood sugar is low?  drinks OJ    Number of hyperglycemic episodes per week  14    Can you tell when your blood sugar is high?  Yes headache  What do you do if your blood sugar is high?  drink water    Have you had a dilated eye exam in the past 12 months?  Yes    Have you had a dental exam in the past 12 months?  No dentures    Are you checking your feet?  Yes    How many days per week are you checking your feet?  7      Dietary Intake   Breakfast  2 boiled egg whites, [place] sausage, 1-2 slices [employer] toast, instant oatmeal tea with cream and splenda 6:30    Snack (morning)  occasional granola bar    Lunch  hamburger or fried chicken, slaw, sweet tea (fast foot) OR can of pork and beans or vienna sausage OR SKIPS  12-2    Snack (afternoon)  grapes or Little Debbie cookie    Dinner  steak, rice, salad, bread OR soup and unsalted crackers, salad 5-6     Snack (evening)  none    Beverage(s)  water, tea with cream and splenda, sweet tea, juice, rare soda      Exercise   Exercise Type  Light (walking / raking leaves)    How many days per week to you exercise?  5    How many minutes per day do you exercise?  180    Total minutes per week of exercise  900      Patient Education   Previous Diabetes Education  Yes (please comment) 2004    Disease state   Definition of diabetes, type 1 and 2, and the diagnosis of diabetes    Nutrition management   Role of diet in the treatment of diabetes and the relationship between the three main macronutrients and blood glucose level;Food label reading, portion sizes and measuring food.;Meal options for control of blood glucose level and chronic complications.;Meal timing in regards to the patients' current diabetes medication.    Physical activity and exercise   Role of exercise on diabetes management, blood pressure control and cardiac health.    Medications  Reviewed patients medication for diabetes, action, purpose, timing of dose and side effects.    Monitoring  Purpose and frequency of SMBG.;Identified appropriate SMBG and/or A1C goals.;Daily foot exams;Yearly dilated eye exam    Acute complications  Taught treatment of hypoglycemia - the 15 rule.    Chronic complications  Relationship between chronic complications and blood glucose control    Psychosocial adjustment  Role of stress on diabetes;Identified and addressed patients feelings and concerns about diabetes;Worked with patient to identify barriers to care and solutions    Personal strategies to promote health  Lifestyle issues that need to be addressed for better diabetes care      Individualized Goals (developed by patient)   Nutrition  General guidelines for healthy choices and portions discussed    Physical Activity  Exercise 5-7 days per week;30 minutes per day    Monitoring   test my blood glucose as discussed    Reducing Risk  examine blood  glucose patterns    Health Coping  discuss diabetes with (comment) MD,RD,cde      Post-Education Assessment   Patient understands the diabetes disease and treatment process.  Demonstrates understanding / competency    Patient understands incorporating nutritional management into lifestyle.  Needs Review    Patient undertands incorporating physical activity into lifestyle.  Demonstrates understanding / competency    Patient understands using medications safely.  Demonstrates understanding / competency  Patient understands monitoring blood glucose, interpreting and using results  Demonstrates understanding / competency    Patient understands prevention, detection, and treatment of acute complications.  Demonstrates understanding / competency    Patient understands prevention, detection, and treatment of chronic complications.  Demonstrates understanding / competency    Patient understands how to develop strategies to address psychosocial issues.  Demonstrates understanding / competency    Patient understands how to develop strategies to promote health/change behavior.  Needs Review      Outcomes   Expected Outcomes  Demonstrated interest in learning. Expect positive outcomes    Future DMSE  PRN    Program Status  Completed       Individualized Plan for Diabetes Self-Management Training:   Learning Objective:  Patient will have a greater understanding of diabetes self-management. Patient education plan is to attend individual and/or group sessions per assessed needs and concerns.   Plan:   Patient Instructions  Avoid sweet tea and any other beverage with sugar. Bake rather than fry Stay active every day. Consider reducing your cookie intake.    Choose a protein bar instead or handful of nuts or cheese and crackers or  peanut butter and crackers. Avoid skipping lunch.    When eating at home, make a [place] sandwich and fruit rather than canned  beans and hot dogs.  Continue to take  your lunch as prescribed  Lantus (long acting) 25 units each morning  Humalog (short acting).  Take 15 minutes before  meals.   10 units before breakfast   8 units before lunch   10 units before supper  When you have a low blood sugar.  Check your blood sugar.  If it is less than 70:   Drink 1/2 cup of juice or regular soda or 2    Tablespoons raisins    Or 3-4 Glucose tabs  In 15 minutes recheck your blood sugar.  If it is low  then repeat. If it is above  70 have a meal or snack  with peanut butter or cheese or meat       Expected Outcomes:  Demonstrated interest in learning. Expect positive outcomes  Education material provided: Living Well with Diabetes, Food label handouts, A1C conversion sheet, Meal plan card and My Plate  If problems or questions, patient to contact team via:  Phone  Future DSME appointment: PRN

## 2017-02-19 NOTE — Patient Instructions (Signed)
Avoid sweet tea and any other beverage with sugar. Bake rather than fry Stay active every day. Consider reducing your cookie intake.    Choose a protein bar instead or handful of nuts or cheese and crackers or  peanut butter and crackers. Avoid skipping lunch.    When eating at home, make a [place] sandwich and fruit rather than canned  beans and hot dogs.  Continue to take your lunch as prescribed  Lantus (long acting) 25 units each morning  Humalog (short acting).  Take 15 minutes before  meals.   10 units before breakfast   8 units before lunch   10 units before supper  When you have a low blood sugar.  Check your blood sugar.  If it is less than 70:   Drink 1/2 cup of juice or regular soda or 2    Tablespoons raisins    Or 3-4 Glucose tabs  In 15 minutes recheck your blood sugar.  If it is low  then repeat. If it is above  70 have a meal or snack  with peanut butter or cheese or meat
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
## 2017-03-23 ENCOUNTER — Ambulatory Visit: Payer: Medicare Other | Admitting: Endocrinology

## 2017-03-23 ENCOUNTER — Encounter: Payer: Self-pay | Admitting: Endocrinology

## 2017-03-23 VITALS — BP 122/70 | HR 64 | Temp 98.3°F | Resp 16 | Ht 71.5 in | Wt 249.0 lb

## 2017-03-23 DIAGNOSIS — I10 Essential (primary) hypertension: Secondary | ICD-10-CM

## 2017-03-23 DIAGNOSIS — E1165 Type 2 diabetes mellitus with hyperglycemia: Secondary | ICD-10-CM | POA: Diagnosis not present

## 2017-03-23 DIAGNOSIS — I739 Peripheral vascular disease, unspecified: Secondary | ICD-10-CM | POA: Diagnosis not present

## 2017-03-23 DIAGNOSIS — Z794 Long term (current) use of insulin: Secondary | ICD-10-CM

## 2017-03-23 NOTE — Patient Instructions (Signed)
NOVOLIN-N -Relion  20 in the morning   Take  humalog or Relion/ Regular Insulin 6 units at LUNCH   and 10  BEFORE  Supper

## 2017-04-10 ENCOUNTER — Telehealth: Payer: Self-pay | Admitting: *Deleted

## 2017-04-10 MED ORDER — ATORVASTATIN CALCIUM 40 MG PO TABS: 40.0000 mg | ORAL_TABLET | Freq: Every day | ORAL | 1 refills | Status: DC

## 2017-04-10 MED ORDER — POTASSIUM CHLORIDE ER 10 MEQ PO TBCR: 10.0000 meq | EXTENDED_RELEASE_TABLET | Freq: Every day | ORAL | 1 refills | Status: DC

## 2017-04-10 MED ORDER — AMLODIPINE BESYLATE 10 MG PO TABS: 10.0000 mg | ORAL_TABLET | Freq: Every day | ORAL | 1 refills | Status: DC

## 2017-04-10 NOTE — Telephone Encounter (Signed)
Patient calling to refill medications before appointment with Dr Acie Fredrickson in April. Refills sent to [facility] on [address].

## 2017-04-20 ENCOUNTER — Encounter: Payer: Self-pay | Admitting: Endocrinology

## 2017-04-20 ENCOUNTER — Ambulatory Visit: Payer: Medicare Other | Admitting: Endocrinology

## 2017-04-20 ENCOUNTER — Telehealth: Payer: Self-pay | Admitting: Endocrinology

## 2017-04-20 VITALS — BP 130/68 | HR 57 | Wt 251.0 lb

## 2017-04-20 DIAGNOSIS — E1165 Type 2 diabetes mellitus with hyperglycemia: Secondary | ICD-10-CM | POA: Diagnosis not present

## 2017-04-20 DIAGNOSIS — L03031 Cellulitis of right toe: Secondary | ICD-10-CM | POA: Diagnosis not present

## 2017-04-20 DIAGNOSIS — Z794 Long term (current) use of insulin: Secondary | ICD-10-CM | POA: Diagnosis not present

## 2017-04-20 MED ORDER — CEPHALEXIN 500 MG PO CAPS: 500.0000 mg | ORAL_CAPSULE | Freq: Three times a day (TID) | ORAL | 0 refills | Status: DC
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
## 2017-04-20 NOTE — Telephone Encounter (Signed)
error 

## 2017-04-24 ENCOUNTER — Ambulatory Visit: Payer: Medicare Other | Admitting: Podiatry

## 2017-04-24 ENCOUNTER — Ambulatory Visit (INDEPENDENT_AMBULATORY_CARE_PROVIDER_SITE_OTHER): Payer: Medicare Other

## 2017-04-24 ENCOUNTER — Encounter: Payer: Self-pay | Admitting: Podiatry

## 2017-04-24 ENCOUNTER — Other Ambulatory Visit: Payer: Self-pay | Admitting: Podiatry

## 2017-04-24 VITALS — BP 134/63 | HR 57 | Ht 70.0 in | Wt 253.0 lb

## 2017-04-24 DIAGNOSIS — M1A9XX1 Chronic gout, unspecified, with tophus (tophi): Secondary | ICD-10-CM

## 2017-04-24 DIAGNOSIS — L03031 Cellulitis of right toe: Secondary | ICD-10-CM

## 2017-04-24 DIAGNOSIS — M19079 Primary osteoarthritis, unspecified ankle and foot: Secondary | ICD-10-CM

## 2017-04-24 MED ORDER — DOXYCYCLINE HYCLATE 100 MG PO TABS: 100.0000 mg | ORAL_TABLET | Freq: Two times a day (BID) | ORAL | 0 refills | Status: DC

## 2017-04-24 MED ORDER — SILVER SULFADIAZINE 1 % EX CREA: 1.0000 "application " | TOPICAL_CREAM | Freq: Every day | CUTANEOUS | 0 refills | Status: DC

## 2017-04-27 ENCOUNTER — Telehealth: Payer: Self-pay | Admitting: *Deleted

## 2017-04-27 LAB — WOUND CULTURE
MICRO NUMBER: 90363213
RESULT:: NO GROWTH
SPECIMEN QUALITY:: ADEQUATE

## 2017-04-27 NOTE — Telephone Encounter (Signed)
Received 2 called for Critical Lab results from [facility] Reference# [id number] E.

## 2017-04-27 NOTE — Telephone Encounter (Signed)
I spoke with [facility] and he states the Anaerobic culture shows WBC, Aerobic culture shows scant staphylococcus aureus. I request a copy of the culture and sensitivity to be faxed to [phone].

## 2017-04-27 NOTE — Telephone Encounter (Signed)
I informed [place] the critical labs I called for this morning had not been received. Kelvin faxed to [phone] at 1:43pm and and I faxed to [facility] office and informed Melody,CMA to give Dr. March Rummage the fax results.

## 2017-04-30 LAB — ANAEROBIC AND AEROBIC CULTURE
MICRO NUMBER:: 90364171
MICRO NUMBER:: 90364172
SPECIMEN QUALITY:: ADEQUATE
SPECIMEN QUALITY:: ADEQUATE

## 2017-05-01 ENCOUNTER — Ambulatory Visit (INDEPENDENT_AMBULATORY_CARE_PROVIDER_SITE_OTHER): Payer: Medicare Other | Admitting: Podiatry

## 2017-05-01 DIAGNOSIS — M1A9XX1 Chronic gout, unspecified, with tophus (tophi): Secondary | ICD-10-CM

## 2017-05-01 DIAGNOSIS — L03031 Cellulitis of right toe: Secondary | ICD-10-CM

## 2017-05-01 MED ORDER — DOXYCYCLINE HYCLATE 100 MG PO TABS: 100.0000 mg | ORAL_TABLET | Freq: Two times a day (BID) | ORAL | 0 refills | Status: DC
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
## 2017-05-01 NOTE — Progress Notes (Signed)
  Subjective:  Patient ID: [name] [name], male    DOB: [birth year],  MRN: [id number]  Chief Complaint  Patient presents with  . Diabetic Ulcer    right second toe - feels better   84 y.o. male returns for the above complaint.  States the toe is doing better.  Would also like to get a port for diabetic shoes.  Denies nausea vomiting fever chills denies other pedal issues  Objective:  There were no vitals filed for this visit. General AA&O x3. Normal mood and affect.  Vascular Pedal pulses palpable.  Neurologic Epicritic sensation grossly intact.  Protective sensation absent  Dermatologic No continued chalky discharge.  Still edema to the DIPJ on the right second toe. .  Orthopedic: No pain to palpation either foot. HAV bilateral lesser digital contractures bilateral   Assessment & Plan:  Patient was evaluated and treated and all questions answered.  Gouty tophi right second toe -Wound culture last visit suggestive of staph.  Rx doxycycline as precaution -Patient advised to monitor for signs of worsening redness pain.   Return in about 2 weeks (around 05/15/2017) for Wound Care.

## 2017-05-05 ENCOUNTER — Other Ambulatory Visit: Payer: Medicare Other

## 2017-05-08 ENCOUNTER — Other Ambulatory Visit: Payer: Self-pay | Admitting: Cardiovascular Disease

## 2017-05-08 MED ORDER — CILOSTAZOL 50 MG PO TABS: 50.0000 mg | ORAL_TABLET | Freq: Two times a day (BID) | ORAL | 5 refills | Status: DC

## 2017-05-08 NOTE — Telephone Encounter (Signed)
Pt's medications were sent to pt's pharmacy as requested. Confirmation received.  

## 2017-05-08 NOTE — Telephone Encounter (Signed)
°*  STAT* If patient is at the pharmacy, call can be transferred to refill team.   1. Which medications need to be refilled? (please list name of each medication and dose if known) Cilostazol  2. Which pharmacy/location (including street and city if local pharmacy) is medication to be sent to[place]  3. Do they need a 30 day or 90 day supply? 60 and refills

## 2017-05-12 ENCOUNTER — Ambulatory Visit (INDEPENDENT_AMBULATORY_CARE_PROVIDER_SITE_OTHER): Payer: Medicare Other | Admitting: Podiatry

## 2017-05-12 ENCOUNTER — Ambulatory Visit (INDEPENDENT_AMBULATORY_CARE_PROVIDER_SITE_OTHER): Payer: Medicare Other

## 2017-05-12 ENCOUNTER — Encounter: Payer: Self-pay | Admitting: Podiatry

## 2017-05-12 DIAGNOSIS — L97519 Non-pressure chronic ulcer of other part of right foot with unspecified severity: Secondary | ICD-10-CM | POA: Diagnosis not present

## 2017-05-12 DIAGNOSIS — M109 Gout, unspecified: Secondary | ICD-10-CM | POA: Diagnosis not present

## 2017-05-12 NOTE — Patient Instructions (Signed)
Get the blood work. This is to check your gout levels and also infection markers Continue with a small amount of antibiotic ointment and a bandage daily to the toe wound Monitor for any signs/symptoms of infection. Call the office immediately if any occur or go directly to the emergency room. Call with any questions/concerns.[name]

## 2017-05-12 NOTE — Progress Notes (Signed)
Subjective: 84 year old male presents the office today for evaluation management with Betha.  However when she went to go evaluate the patient the second toe was very swollen she had concerns about this so therefore I came to see the patient.  He has been under the care of Dr. March Rummage for gout but also he has been on antibiotics for concern for infection.  Patient is unsure of how the toe is been doing but the pain is improved compared to what he has reported previously. Denies any systemic complaints such as fevers, chills, nausea, vomiting. No acute changes since last appointment, and no other complaints at this time.   Objective: AAO x3, NAD DP/PT pulses palpable bilaterally, CRT less than 3 seconds The right second toe is swollen and there is a wound present on the dorsal aspect of the IPJ which does probe and there is only bloody drainage expressed but there is no purulence or gouty tophi expressed from the area.  There is mild edema to the toe and mild erythema but there is no ascending cellulitis.  There is no fluctuation or crepitation.  There is no malodor.  No swelling to the foot. No open lesions or pre-ulcerative lesions.  No pain with calf compression, swelling, warmth, erythema  Assessment: Concern for osteomyelitis left second toe  Plan: -All treatment options discussed with the patient including all alternatives, risks, complications.  -X-rays were obtained and reviewed.  There is progression of the bony destruction compared to prior x-rays.  Concern for osteomyelitis.  There is no soft tissue emphysema.  This could also be a progression of gout he has been treated for previously. -For now we will continue his antibiotics he is currently taking.  He has a follow-up appointment scheduled with Dr. March Rummage on Friday with him to keep this.  I did discuss with him possible amputation of the toe.  Continue with daily dressing changes with antibiotic ointment. -Blood work ordered  today. -Monitor for any clinical signs or symptoms of infection and directed to call the office immediately should any occur or go to the ER. -Patient encouraged to call the office with any questions, concerns, change in symptoms.   Trula Slade DPM

## 2017-05-13 LAB — CBC WITH DIFFERENTIAL/PLATELET
BASOS PCT: 0.5 %
Basophils Absolute: 49 cells/uL (ref 0–200)
EOS ABS: 255 {cells}/uL (ref 15–500)
Eosinophils Relative: 2.6 %
HCT: 40.2 % (ref 38.5–50.0)
HEMOGLOBIN: 13.6 g/dL (ref 13.2–17.1)
LYMPHS ABS: 2470 {cells}/uL (ref 850–3900)
MCH: 29.8 pg (ref 27.0–33.0)
MCHC: 33.8 g/dL (ref 32.0–36.0)
MCV: 88.2 fL (ref 80.0–100.0)
MPV: 10.7 fL (ref 7.5–12.5)
Monocytes Relative: 6.9 %
NEUTROS ABS: 6350 {cells}/uL (ref 1500–7800)
Neutrophils Relative %: 64.8 %
Platelets: 205 10*3/uL (ref 140–400)
RBC: 4.56 10*6/uL (ref 4.20–5.80)
RDW: 12.8 % (ref 11.0–15.0)
Total Lymphocyte: 25.2 %
WBC: 9.8 10*3/uL (ref 3.8–10.8)
WBCMIX: 676 {cells}/uL (ref 200–950)

## 2017-05-13 LAB — SEDIMENTATION RATE: Sed Rate: 14 mm/h (ref 0–20)

## 2017-05-13 LAB — C-REACTIVE PROTEIN: CRP: 1.1 mg/L (ref <8.0)

## 2017-05-13 LAB — URIC ACID: Uric Acid, Serum: 7.9 mg/dL (ref 4.0–8.0)

## 2017-05-15 ENCOUNTER — Ambulatory Visit (INDEPENDENT_AMBULATORY_CARE_PROVIDER_SITE_OTHER): Payer: Medicare Other | Admitting: Podiatry

## 2017-05-15 ENCOUNTER — Encounter: Payer: Self-pay | Admitting: Podiatry

## 2017-05-15 DIAGNOSIS — M109 Gout, unspecified: Secondary | ICD-10-CM

## 2017-05-15 DIAGNOSIS — L97519 Non-pressure chronic ulcer of other part of right foot with unspecified severity: Secondary | ICD-10-CM

## 2017-05-17 NOTE — Progress Notes (Signed)
  Subjective:  Patient ID: [name] [name], male    DOB: 1931-12-30,  MRN: [id number]  Chief Complaint  Patient presents with  . Wound Check    Rt toe, feeling better   84 y.o. male returns for the above complaint. States the toe is feeling better. Denies pain today. Had blood work performed out of concern for infection.  Objective:  There were no vitals filed for this visit. General AA&O x3. Normal mood and affect.  Vascular Pedal pulses palpable.  Neurologic Epicritic sensation grossly intact.  Dermatologic R 2nd toe without open ulceration. No continued chalky discharge.No warmth or erythema. No drainage.  Orthopedic: No pain to palpation R 2nd toe.    Ref. Range 05/12/2017 00:00  Uric Acid, Serum Latest Ref Range: 4.0 - 8.0 mg/dL 7.9  CRP Latest Ref Range: <8.0 mg/L 1.1  WBC Latest Ref Range: 3.8 - 10.8 Thousand/uL 9.8  WBC mixed population Latest Ref Range: 200 - 950 cells/uL 676    Ref. Range 05/12/2017 00:00  Sed Rate Latest Ref Range: 0 - 20 mm/h 14   Assessment & Plan:  Patient was evaluated and treated and all questions answered.  Gout R 2nd Toe -Labs reviewed. All WNL. Uric acid on higher end of normal. -No continued gouty discharge. -Pain improving. -Will continue to monitor. Should pain recur or gouty deposit worsen would consider amputation of the affected digit.  Return in about 2 weeks (around 05/29/2017) for Wound Care, gout f/u.

## 2017-05-19 ENCOUNTER — Ambulatory Visit (INDEPENDENT_AMBULATORY_CARE_PROVIDER_SITE_OTHER): Payer: Medicare Other | Admitting: Cardiovascular Disease

## 2017-05-19 ENCOUNTER — Encounter: Payer: Self-pay | Admitting: Cardiovascular Disease

## 2017-05-19 VITALS — BP 116/44 | HR 64 | Ht 72.0 in | Wt 247.2 lb

## 2017-05-19 DIAGNOSIS — I2510 Atherosclerotic heart disease of native coronary artery without angina pectoris: Secondary | ICD-10-CM | POA: Diagnosis not present

## 2017-05-19 DIAGNOSIS — I739 Peripheral vascular disease, unspecified: Secondary | ICD-10-CM

## 2017-05-19 DIAGNOSIS — I5042 Chronic combined systolic (congestive) and diastolic (congestive) heart failure: Secondary | ICD-10-CM | POA: Diagnosis not present

## 2017-05-19 NOTE — Patient Instructions (Signed)
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
## 2017-05-25 ENCOUNTER — Ambulatory Visit: Payer: Medicare Other | Admitting: Endocrinology

## 2017-05-29 ENCOUNTER — Ambulatory Visit (INDEPENDENT_AMBULATORY_CARE_PROVIDER_SITE_OTHER): Payer: Medicare Other | Admitting: Podiatry

## 2017-05-29 DIAGNOSIS — L97519 Non-pressure chronic ulcer of other part of right foot with unspecified severity: Secondary | ICD-10-CM

## 2017-05-29 DIAGNOSIS — M109 Gout, unspecified: Secondary | ICD-10-CM

## 2017-05-29 MED ORDER — NONFORMULARY OR COMPOUNDED ITEM: 1.0000 g | Freq: Four times a day (QID) | 2 refills | Status: DC

## 2017-05-31 NOTE — Progress Notes (Signed)
  Subjective:  Patient ID: [name] [name], male    DOB: 30-Sep-1931,  MRN: [id number]  Chief Complaint  Patient presents with  . Foot Ulcer    right toe, wound check - looks great, healed over.   . Diabetes    patient asking about diabetic shoes   84 y.o. male returns for the above complaint.  States that the tube is doing much better.  Denies pain.  Objective:  There were no vitals filed for this visit. General AA&O x3. Normal mood and affect.  Vascular Pedal pulses palpable.  Neurologic Epicritic sensation grossly intact.  Dermatologic No open lesions. Skin normal texture and turgor.  No continued discharge from the right second toe  Orthopedic: No pain to palpation either foot.   Assessment & Plan:  Patient was evaluated and treated and all questions answered.  Right second toe gout -No pain no continue discharge -We will check on status of diabetic shoes  Return in about 1 month (around 06/26/2017) for Gout/Wound Check, possible shoe pickup.

## 2017-06-01 ENCOUNTER — Other Ambulatory Visit: Payer: Medicare Other

## 2017-06-04 ENCOUNTER — Other Ambulatory Visit (INDEPENDENT_AMBULATORY_CARE_PROVIDER_SITE_OTHER): Payer: Medicare Other

## 2017-06-04 ENCOUNTER — Ambulatory Visit: Payer: Medicare Other | Admitting: Endocrinology

## 2017-06-04 DIAGNOSIS — E1165 Type 2 diabetes mellitus with hyperglycemia: Secondary | ICD-10-CM

## 2017-06-04 DIAGNOSIS — Z794 Long term (current) use of insulin: Secondary | ICD-10-CM | POA: Diagnosis not present

## 2017-06-04 LAB — HEMOGLOBIN A1C: HEMOGLOBIN A1C: 7.4 % — AB (ref 4.6–6.5)

## 2017-06-04 LAB — BASIC METABOLIC PANEL
BUN: 31 mg/dL — ABNORMAL HIGH (ref 6–23)
CALCIUM: 9.2 mg/dL (ref 8.4–10.5)
CO2: 28 mEq/L (ref 19–32)
Chloride: 106 mEq/L (ref 96–112)
Creatinine, Ser: 1.45 mg/dL (ref 0.40–1.50)
GFR: 59.68 mL/min — AB (ref >60.00)
Glucose, Bld: 181 mg/dL — ABNORMAL HIGH (ref 70–99)
POTASSIUM: 3.9 meq/L (ref 3.5–5.1)
SODIUM: 141 meq/L (ref 135–145)

## 2017-06-05 ENCOUNTER — Encounter: Payer: Self-pay | Admitting: Endocrinology

## 2017-06-05 ENCOUNTER — Ambulatory Visit: Payer: Medicare Other | Admitting: Endocrinology

## 2017-06-05 VITALS — BP 114/62 | HR 64 | Ht 72.0 in | Wt 241.0 lb

## 2017-06-05 DIAGNOSIS — Z794 Long term (current) use of insulin: Secondary | ICD-10-CM | POA: Diagnosis not present

## 2017-06-05 DIAGNOSIS — E1165 Type 2 diabetes mellitus with hyperglycemia: Secondary | ICD-10-CM

## 2017-06-05 LAB — URINALYSIS, ROUTINE W REFLEX MICROSCOPIC
BILIRUBIN URINE: NEGATIVE
Hgb urine dipstick: NEGATIVE
Ketones, ur: NEGATIVE
NITRITE: POSITIVE — AB
PH: 8.5 — AB (ref 5.0–8.0)
Specific Gravity, Urine: <=1.005 — AB (ref 1.000–1.030)
Total Protein, Urine: 100 — AB
URINE GLUCOSE: NEGATIVE
Urobilinogen, UA: 0.2 (ref 0.0–1.0)

## 2017-06-05 LAB — MICROALBUMIN / CREATININE URINE RATIO
CREATININE, U: 122.7 mg/dL
Microalb Creat Ratio: 17.1 mg/g (ref 0.0–30.0)
Microalb, Ur: 21 mg/dL — ABNORMAL HIGH (ref 0.0–1.9)

## 2017-06-05 MED ORDER — GABAPENTIN 100 MG PO CAPS: 100.0000 mg | ORAL_CAPSULE | Freq: Three times a day (TID) | ORAL | 3 refills | Status: DC
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
## 2017-06-05 NOTE — Patient Instructions (Signed)
N INSULIN 20 IN AM  R CLEAR INSULIN 10 NEFORE BREAKFAST, 6 UNITS BEFORE LUNCH AND 8-10 AT SUPPER

## 2017-07-01 ENCOUNTER — Encounter: Payer: Self-pay | Admitting: Internal Medicine

## 2017-07-01 ENCOUNTER — Other Ambulatory Visit: Payer: Self-pay

## 2017-07-01 ENCOUNTER — Ambulatory Visit: Payer: Medicare Other | Admitting: Internal Medicine

## 2017-07-01 ENCOUNTER — Ambulatory Visit: Payer: Medicare Other

## 2017-07-01 VITALS — BP 98/75 | HR 74 | Temp 98.0°F | Ht 72.0 in | Wt 242.6 lb

## 2017-07-01 DIAGNOSIS — E118 Type 2 diabetes mellitus with unspecified complications: Secondary | ICD-10-CM | POA: Diagnosis not present

## 2017-07-01 DIAGNOSIS — I25810 Atherosclerosis of coronary artery bypass graft(s) without angina pectoris: Secondary | ICD-10-CM

## 2017-07-01 DIAGNOSIS — Z794 Long term (current) use of insulin: Secondary | ICD-10-CM

## 2017-07-01 DIAGNOSIS — R319 Hematuria, unspecified: Secondary | ICD-10-CM | POA: Diagnosis not present

## 2017-07-01 DIAGNOSIS — R300 Dysuria: Secondary | ICD-10-CM | POA: Diagnosis not present

## 2017-07-01 DIAGNOSIS — I739 Peripheral vascular disease, unspecified: Secondary | ICD-10-CM | POA: Diagnosis not present

## 2017-07-01 DIAGNOSIS — I159 Secondary hypertension, unspecified: Secondary | ICD-10-CM | POA: Diagnosis not present

## 2017-07-01 DIAGNOSIS — I5022 Chronic systolic (congestive) heart failure: Secondary | ICD-10-CM

## 2017-07-01 LAB — POCT URINALYSIS DIPSTICK
BILIRUBIN UA: NEGATIVE
GLUCOSE UA: NEGATIVE
Nitrite, UA: POSITIVE
PH UA: 7.5 (ref 5.0–8.0)
Protein, UA: POSITIVE — AB
Spec Grav, UA: 1.02 (ref 1.010–1.025)
Urobilinogen, UA: 1 E.U./dL

## 2017-07-01 LAB — GLUCOSE, CAPILLARY: Glucose-Capillary: 148 mg/dL — ABNORMAL HIGH (ref 65–99)

## 2017-07-01 MED ORDER — NITROFURANTOIN MONOHYD MACRO 100 MG PO CAPS: 100.0000 mg | ORAL_CAPSULE | Freq: Two times a day (BID) | ORAL | 0 refills | Status: DC

## 2017-07-01 MED ORDER — CEPHALEXIN 500 MG PO CAPS: 500.0000 mg | ORAL_CAPSULE | Freq: Two times a day (BID) | ORAL | 0 refills | Status: DC

## 2017-07-01 NOTE — Patient Instructions (Signed)
Mr. Porte,  I am going to start you on a medication called Keflex for a urinary tract infection. Take this twice a day for the next week.   Please follow up with me in [facility] in 2 weeks for re-check.
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
## 2017-07-03 LAB — URINALYSIS, COMPLETE

## 2017-07-05 LAB — URINE CULTURE

## 2017-07-05 NOTE — Assessment & Plan Note (Signed)
[name] [name] is followed by Dr. Alvester Chou.  She had unsuccessful SFA revascularizations.  He is currently taking aspirin 81 mg daily as well as cilostazol 50 mg twice daily.  He denies any complaints today.

## 2017-07-05 NOTE — Assessment & Plan Note (Signed)
[name] [name] has complaints of dysuria for the past month.  He reports burning with urination as well as urgency and frequency.  He denies any fevers or chills.  No flank tenderness.  He is eating and drinking well without any nausea or vomiting.  Additionally, he reports that over the past week he has noticed scant amounts of gross hematuria.  He denies any pain currently.  Additionally, he does have a remote history of prostate cancer over 20 years ago with brachii therapy.  He also has a history of urethral stricture and has previously been followed by urology.  UA from 06/05/2017 showed a pH of 8.5, large leukocytes, positive nitrite, 7-10 WBCs, many bacteria.  No hemoglobin or red blood cells present.  This does not appear to have been followed up upon.  Today, urine dipstick is nitrite positive with large leukocytes.  There is trace blood present.  Unfortunately, there was not enough sample to perform full UA.  Will prescribe a 7-day course of Keflex for cystitis.  Obtain urine culture.  Urine culture growing pansensitive Proteus mirabilis.  Will finish course of Keflex.  Patient will follow-up in 2 weeks for reassessment.  At that time will recheck his urine for any further hematuria as it may be secondary to his UTI.

## 2017-07-05 NOTE — Assessment & Plan Note (Signed)
Lab Results  Component Value Date   HGBA1C 7.4 (H) 06/04/2017   HGBA1C 7.3 (H) 02/23/2015   HGBA1C 7.0 10/25/2014    Patient is followed by Dr. Dwyane Dee endocrinology.  His last A1c was 7.4 which is a goal for 84 year old gentleman.  He is currently prescribed NPH 24 units daily at breakfast as well as Novolin sliding scale.  He denies any episodes of hypoglycemia.  We will continue his current medications

## 2017-07-05 NOTE — Assessment & Plan Note (Signed)
BP Readings from Last 3 Encounters:  07/01/17 98/75  06/05/17 114/62  05/19/17 (!) 116/44    Lab Results  Component Value Date   NA 141 06/04/2017   K 3.9 06/04/2017   CREATININE 1.45 06/04/2017   Blood pressure well controlled and beyond goal today.  Blood pressure is 98/75.  He denies any symptoms today.  He did bring with medications with him today.  His current prescription list includes metoprolol 25 mg twice daily, amlodipine 10 mg daily, hydrochlorothiazide 25 mg daily, as well as both losartan 50 mg daily and lisinopril 20 mg twice daily.  Neither lisinopril no losartan or among his medications today.  We will discontinue lisinopril from his medication list as it appears to last been prescribed in 2016 though this is unclear.  We will leave his losartan on his medication list today.  Will try to obtain an accurate refill history from his pharmacy.  Given his diabetes I would like him to continue an ARB though given his blood pressure today we may be able to decrease or stop hydrochlorthiazide or amlodipine in the future.

## 2017-07-05 NOTE — Assessment & Plan Note (Signed)
Mr. Keir has a history of CAD and is status post stenting and subsequent CABG in 2014.  He is followed by [name].   He is currently asymptomatic.  He is currently taking Aspirin 81 mg daily, Lipitor 40 mg daily.  We will continue his current medications.

## 2017-07-05 NOTE — Progress Notes (Signed)
Internal Medicine Clinic Attending  Case discussed with Dr. Boswell at the time of the visit.  We reviewed the resident's history and exam and pertinent patient test results.  I agree with the assessment, diagnosis, and plan of care documented in the resident's note.  

## 2017-07-05 NOTE — Assessment & Plan Note (Signed)
Most recent echo from 05/03/15 with an EF of 45 to 50%.  Consistent with grade 2 diastolic dysfunction.  Currently taking metoprolol 25 mg twice daily, prescribed losartan 50 mg daily.

## 2017-07-13 ENCOUNTER — Telehealth: Payer: Self-pay | Admitting: Endocrinology

## 2017-07-13 ENCOUNTER — Other Ambulatory Visit: Payer: Self-pay

## 2017-07-13 MED ORDER — GLUCOSE BLOOD VI STRP: ORAL_STRIP | 2 refills | Status: DC

## 2017-07-13 NOTE — Telephone Encounter (Signed)
Rx sent to pharmacy   

## 2017-07-13 NOTE — Telephone Encounter (Signed)
Need test strips of one touch meter, [facility] - [place], [place] DEA #:  --    Pharmacy

## 2017-07-15 ENCOUNTER — Ambulatory Visit: Payer: Medicare Other | Admitting: Internal Medicine

## 2017-07-15 ENCOUNTER — Other Ambulatory Visit: Payer: Self-pay

## 2017-07-15 VITALS — BP 116/63 | HR 61 | Temp 97.8°F | Ht 72.0 in | Wt 245.2 lb

## 2017-07-15 DIAGNOSIS — R300 Dysuria: Secondary | ICD-10-CM

## 2017-07-15 DIAGNOSIS — I159 Secondary hypertension, unspecified: Secondary | ICD-10-CM

## 2017-07-15 LAB — POCT URINALYSIS DIPSTICK
Bilirubin, UA: NEGATIVE
Blood, UA: NEGATIVE
Glucose, UA: NEGATIVE
Nitrite, UA: POSITIVE
Protein, UA: NEGATIVE
Spec Grav, UA: 1.02 (ref 1.010–1.025)
Urobilinogen, UA: 0.2 E.U./dL
pH, UA: 5.5 (ref 5.0–8.0)

## 2017-07-15 MED ORDER — HYDROCHLOROTHIAZIDE 25 MG PO TABS: 25.0000 mg | ORAL_TABLET | Freq: Every day | ORAL | 5 refills | Status: DC

## 2017-07-15 MED ORDER — ATORVASTATIN CALCIUM 40 MG PO TABS: 40.0000 mg | ORAL_TABLET | Freq: Every day | ORAL | 1 refills | Status: DC

## 2017-07-15 MED ORDER — AMLODIPINE BESYLATE 10 MG PO TABS: 10.0000 mg | ORAL_TABLET | Freq: Every day | ORAL | 1 refills | Status: DC

## 2017-07-15 MED ORDER — METOPROLOL TARTRATE 25 MG PO TABS: 25.0000 mg | ORAL_TABLET | Freq: Two times a day (BID) | ORAL | 5 refills | Status: DC
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
## 2017-07-15 NOTE — Assessment & Plan Note (Signed)
Patient states that his symptoms of dysuria, urgency, and frequency have resolved since completing the 7-day course of Keflex for Proteus urinary tract infection.  He denies gross hematuria.  Denies fevers, chills or flank pain.  UA from 07/05/2017 demonstrated trace blood present.  Patient is following up today for repeat urinalysis to ensure his hematuria resolved after treatment of UTI.   Repeat point-of-care urine dipstick was negative for blood.  Did show moderate nitrites and moderate leuks, but the patient is completely asymptomatic. Hematuria has resolved following antibiotic therapy.   Plan: -Will continue to monitor -no further intervention at this time

## 2017-07-15 NOTE — Assessment & Plan Note (Addendum)
BP Readings from Last 3 Encounters:  07/15/17 116/63  07/01/17 98/75  06/05/17 114/62  Patient's blood pressure well controlled on current regimen of metoprolol 25 mg twice daily, losartan 50 mg daily, hydrochlorothiazide 25 mg daily, amlodipine 10 mg daily.   Plan: -Continue current treatment regimen -Refills for amlodipine, metoprolol, and HCTZ sent to his pharmacy -Return to clinic in 2 to 3 months to establish care with PCP

## 2017-07-15 NOTE — Patient Instructions (Addendum)
[name] [name],   It was a pleasure meeting you today. I am happy you are feeling better.   I have refilled your medications and sent them to your pharmacy.   Please return to clinic in 2 to 3 months to establish care with a new primary care doctor Dr. Eppie Gibson.

## 2017-07-16 LAB — URINALYSIS, ROUTINE W REFLEX MICROSCOPIC
Bilirubin, UA: NEGATIVE
Glucose, UA: NEGATIVE
Ketones, UA: NEGATIVE
Nitrite, UA: NEGATIVE
Protein, UA: NEGATIVE
RBC, UA: NEGATIVE
Specific Gravity, UA: 1.017 (ref 1.005–1.030)
Urobilinogen, Ur: 0.2 mg/dL (ref 0.2–1.0)
pH, UA: 5 (ref 5.0–7.5)

## 2017-07-16 LAB — MICROSCOPIC EXAMINATION
Casts: NONE SEEN /lpf
Epithelial Cells (non renal): NONE SEEN /hpf (ref 0–10)
WBC, UA: >30 /hpf — AB (ref 0–5)

## 2017-07-21 ENCOUNTER — Telehealth: Payer: Self-pay | Admitting: Internal Medicine

## 2017-07-21 NOTE — Telephone Encounter (Signed)
Patient is having a little burning when pee, patient is wondering if there something he can take. Pls call him back

## 2017-07-21 NOTE — Telephone Encounter (Signed)
Pt states Saturday he drank a lot of tea and other drinks that he should have limited himself on. He was not able to go to a bathroom and he tried to squeeze himself so as not to void, he states he squeezed himself very tight but "it busted out" and since when he voids he burns, he thinks he squeezed too tight but thinks he needs that medicine again for infection.  There are no appts in [facility] til next week, I have ask him to come to lab in morning for a urine sample and he states he can, do you agree? If so will you place an order for u/a and poss c&s? I will call him back and confirm or cancel, please advise

## 2017-07-22 ENCOUNTER — Other Ambulatory Visit: Payer: Medicare Other

## 2017-07-22 DIAGNOSIS — R300 Dysuria: Secondary | ICD-10-CM

## 2017-07-22 NOTE — Telephone Encounter (Signed)
Lab staff reports that pt came in this am and stated he couldn't be waited he might need to go to the store. Dr lacroce assisted them with orders

## 2017-07-23 ENCOUNTER — Other Ambulatory Visit: Payer: Self-pay | Admitting: Internal Medicine

## 2017-07-23 DIAGNOSIS — R300 Dysuria: Secondary | ICD-10-CM

## 2017-07-23 LAB — URINALYSIS, ROUTINE W REFLEX MICROSCOPIC
Bilirubin, UA: NEGATIVE
GLUCOSE, UA: NEGATIVE
Ketones, UA: NEGATIVE
NITRITE UA: NEGATIVE
PH UA: 5 (ref 5.0–7.5)
Protein, UA: NEGATIVE
RBC UA: NEGATIVE
Specific Gravity, UA: 1.017 (ref 1.005–1.030)
UUROB: 0.2 mg/dL (ref 0.2–1.0)

## 2017-07-23 LAB — MICROSCOPIC EXAMINATION: Casts: NONE SEEN /lpf

## 2017-07-23 MED ORDER — SULFAMETHOXAZOLE-TRIMETHOPRIM 800-160 MG PO TABS: 1.0000 | ORAL_TABLET | Freq: Two times a day (BID) | ORAL | 0 refills | Status: AC

## 2017-07-23 NOTE — Telephone Encounter (Signed)
Call patient back regarding urinalysis.  UA with many leukocytes and many bacteria.  Will prescribe Bactrim for 7 days duration.  Urine culture has not resulted.  Concerning that the patient is having recurrent urinary tract infections despite proper treatment.  His last urinary tract infection was treated properly with Keflex for 7 days.  I have advised the patient to make an appointment in clinic following his treatment course to be further evaluated for these recurrent urinary tract infections.

## 2017-07-23 NOTE — Telephone Encounter (Addendum)
Patient called requesting lab results. Reports recent death in the family which will take him out of town. Will need to know today if he has an infection which will require an antibiotic. [name] Hartshorn, RN, BSN

## 2017-07-23 NOTE — Assessment & Plan Note (Addendum)
Patient had a lab appointment in clinic due to complaints of dysuria.  Urinalysis was obtained at that time, which reveals many leukocytes many bacteria.  Urine culture is still pending.  Patient was recently treated with 7 days of Keflex and initially his symptoms had completely resolved.  He states he had a death in the family and is leaving town and is requesting an antibiotic.  I am concerned that he is getting recurrent urinary tract infections despite proper antibiotic treatment.  Given that he is leaving town, I have prescribed him a 7-day course of Bactrim empirically pending the urine culture results.  I have advised the patient to return to clinic to be evaluated for these recurrent urinary tract infections.  Patient does have a history of prostate cancer and concerned maybe he is experiencing recurrent UTIs due to obstruction/urinary retention.    Patient states he will call on Monday when he returns to [place] to schedule and appointment.   Plan: -Urine Culture pending, will need to be followed up

## 2017-07-24 LAB — URINE CULTURE

## 2017-07-25 NOTE — Progress Notes (Signed)
Internal Medicine Clinic Attending  Case discussed with Dr. LaCroce  at the time of the visit.  We reviewed the resident's history and exam and pertinent patient test results.  I agree with the assessment, diagnosis, and plan of care documented in the resident's note.  Alexander N Raines, MD   

## 2017-08-04 ENCOUNTER — Telehealth: Payer: Self-pay | Admitting: Endocrinology

## 2017-08-04 ENCOUNTER — Other Ambulatory Visit: Payer: Self-pay

## 2017-08-04 MED ORDER — METFORMIN HCL ER 500 MG PO TB24: 1000.0000 mg | ORAL_TABLET | Freq: Every day | ORAL | 4 refills | Status: DC

## 2017-08-04 NOTE — Telephone Encounter (Signed)
Rx sent 

## 2017-08-04 NOTE — Telephone Encounter (Signed)
Patient request a refill for metformin send to  [place], [place]

## 2017-08-12 ENCOUNTER — Ambulatory Visit (INDEPENDENT_AMBULATORY_CARE_PROVIDER_SITE_OTHER): Payer: Medicare Other | Admitting: Internal Medicine

## 2017-08-12 ENCOUNTER — Other Ambulatory Visit: Payer: Self-pay

## 2017-08-12 VITALS — BP 130/62 | HR 67 | Temp 98.6°F | Ht 72.0 in | Wt 244.4 lb

## 2017-08-12 DIAGNOSIS — Z8546 Personal history of malignant neoplasm of prostate: Secondary | ICD-10-CM

## 2017-08-12 DIAGNOSIS — Z87440 Personal history of urinary (tract) infections: Secondary | ICD-10-CM | POA: Diagnosis not present

## 2017-08-12 DIAGNOSIS — R300 Dysuria: Secondary | ICD-10-CM

## 2017-08-12 LAB — POCT URINALYSIS DIPSTICK
Bilirubin, UA: NEGATIVE
Glucose, UA: NEGATIVE
Ketones, UA: NEGATIVE
LEUKOCYTES UA: NEGATIVE
NITRITE UA: NEGATIVE
PH UA: 5.5 (ref 5.0–8.0)
PROTEIN UA: NEGATIVE
RBC UA: NEGATIVE
Spec Grav, UA: 1.015 (ref 1.010–1.025)
Urobilinogen, UA: 0.2 E.U./dL

## 2017-08-12 NOTE — Addendum Note (Signed)
Addended by: Tamsen Roers on: 08/12/2017 04:59 PM   Modules accepted: Orders

## 2017-08-12 NOTE — Patient Instructions (Signed)
It was great meeting you today! I'm glad you are feeling well and no longer have urinary symptoms! Your urine test today did not show infection.   If you do get recurrent symptoms please be sure to contact our clinic or your urologist right away.   Please continue the remainder of your medications as you are and schedule an appointment with Dr. Eppie Gibson within the next 2-3 months to discuss your chronic medical conditions. We are happy to see you sooner should an issue arise.
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
## 2017-08-12 NOTE — Assessment & Plan Note (Signed)
Assessment:  Patient reports resolution of dysuria after completing course of Bactrim for E.coli UTI. Denied fevers, chills or back pain. When clarifying his history, he reports that he did not complete his first course of antibiotics and feels this is why his symptoms reoccurred.   Plan: UA without infection. I'm pleased he is feeling better after completing this course of antibiotics. He has history of prostate cancer (20 yrs ago) and follows with urology (last seen 10/18). We discussed the need to see urology sooner than usual should he develop recurrent UTIs. Pt to contact clinic should symptoms return. I've also asked him to schedule an appointment with his PCP to discuss is chronic medical issues.

## 2017-08-13 NOTE — Progress Notes (Signed)
Internal Medicine Clinic Attending  Case discussed with Dr. Molt at the time of the visit.  We reviewed the resident's history and exam and pertinent patient test results.  I agree with the assessment, diagnosis, and plan of care documented in the resident's note. 

## 2017-09-03 ENCOUNTER — Other Ambulatory Visit (INDEPENDENT_AMBULATORY_CARE_PROVIDER_SITE_OTHER): Payer: Medicare Other

## 2017-09-03 DIAGNOSIS — E1165 Type 2 diabetes mellitus with hyperglycemia: Secondary | ICD-10-CM | POA: Diagnosis not present

## 2017-09-03 DIAGNOSIS — Z794 Long term (current) use of insulin: Secondary | ICD-10-CM

## 2017-09-03 LAB — COMPREHENSIVE METABOLIC PANEL
ALK PHOS: 89 U/L (ref 39–117)
ALT: 32 U/L (ref 0–53)
AST: 29 U/L (ref 0–37)
Albumin: 3.9 g/dL (ref 3.5–5.2)
BILIRUBIN TOTAL: 0.6 mg/dL (ref 0.2–1.2)
BUN: 24 mg/dL — ABNORMAL HIGH (ref 6–23)
CALCIUM: 9.5 mg/dL (ref 8.4–10.5)
CO2: 27 mEq/L (ref 19–32)
CREATININE: 1.36 mg/dL (ref 0.40–1.50)
Chloride: 106 mEq/L (ref 96–112)
GFR: 64.22 mL/min (ref >60.00)
Glucose, Bld: 110 mg/dL — ABNORMAL HIGH (ref 70–99)
Potassium: 3.5 mEq/L (ref 3.5–5.1)
Sodium: 142 mEq/L (ref 135–145)
TOTAL PROTEIN: 7.2 g/dL (ref 6.0–8.3)

## 2017-09-03 LAB — HEMOGLOBIN A1C: Hgb A1c MFr Bld: 7.8 % — ABNORMAL HIGH (ref 4.6–6.5)

## 2017-09-07 ENCOUNTER — Encounter: Payer: Self-pay | Admitting: Endocrinology

## 2017-09-07 ENCOUNTER — Other Ambulatory Visit: Payer: Self-pay

## 2017-09-07 ENCOUNTER — Ambulatory Visit: Payer: Medicare Other | Admitting: Endocrinology

## 2017-09-07 VITALS — BP 150/80 | HR 58 | Ht 72.0 in | Wt 253.4 lb

## 2017-09-07 DIAGNOSIS — I10 Essential (primary) hypertension: Secondary | ICD-10-CM | POA: Diagnosis not present

## 2017-09-07 DIAGNOSIS — Z794 Long term (current) use of insulin: Secondary | ICD-10-CM

## 2017-09-07 DIAGNOSIS — E1165 Type 2 diabetes mellitus with hyperglycemia: Secondary | ICD-10-CM

## 2017-09-07 MED ORDER — LOSARTAN POTASSIUM 50 MG PO TABS: 50.0000 mg | ORAL_TABLET | Freq: Every day | ORAL | 3 refills | Status: DC

## 2017-09-07 MED ORDER — POTASSIUM CHLORIDE ER 10 MEQ PO TBCR: 10.0000 meq | EXTENDED_RELEASE_TABLET | Freq: Every day | ORAL | 1 refills | Status: DC
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
## 2017-09-07 NOTE — Patient Instructions (Addendum)
Check blood sugars on waking up    Also check blood sugars about 2 hours after a meal and do this after different meals by rotation  Recommended blood sugar levels on waking up is 90-130 and about 2 hours after meal is 130-160  Please bring your blood sugar monitor to each visit, thank you  CLOUDY INSULIN 20 UNITS IN AM AND 5 AT SUPPER  NO SWEET TEA  TAKE R INSULIN 10-12 BEFORE EACH MEAL

## 2017-10-15 ENCOUNTER — Other Ambulatory Visit: Payer: Self-pay | Admitting: Cardiovascular Disease

## 2017-10-15 NOTE — Telephone Encounter (Signed)
°*  STAT* If patient is at the pharmacy, call can be transferred to refill team.   1. Which medications need to be refilled? (please list name of each medication and dose if known) Atorvastatin 40 mg   2. Which pharmacy/location (including street and city if local pharmacy) is medication to be sent to?[facility] on [address] [place]   3. Do they need a 30 day or 90 day supply? [place]

## 2017-11-06 ENCOUNTER — Other Ambulatory Visit (INDEPENDENT_AMBULATORY_CARE_PROVIDER_SITE_OTHER): Payer: Medicare Other

## 2017-11-06 DIAGNOSIS — Z794 Long term (current) use of insulin: Secondary | ICD-10-CM

## 2017-11-06 DIAGNOSIS — E1165 Type 2 diabetes mellitus with hyperglycemia: Secondary | ICD-10-CM | POA: Diagnosis not present

## 2017-11-06 LAB — BASIC METABOLIC PANEL
BUN: 24 mg/dL — ABNORMAL HIGH (ref 6–23)
CO2: 30 meq/L (ref 19–32)
Calcium: 9 mg/dL (ref 8.4–10.5)
Chloride: 107 mEq/L (ref 96–112)
Creatinine, Ser: 1.52 mg/dL — ABNORMAL HIGH (ref 0.40–1.50)
GFR: 56.46 mL/min — ABNORMAL LOW (ref >60.00)
Glucose, Bld: 109 mg/dL — ABNORMAL HIGH (ref 70–99)
Potassium: 3.8 mEq/L (ref 3.5–5.1)
Sodium: 141 mEq/L (ref 135–145)

## 2017-11-06 LAB — LIPID PANEL
Cholesterol: 98 mg/dL (ref 0–200)
HDL: 31.6 mg/dL — AB (ref >39.00)
LDL CALC: 47 mg/dL (ref 0–99)
NONHDL: 66.61
Total CHOL/HDL Ratio: 3
Triglycerides: 99 mg/dL (ref 0.0–149.0)
VLDL: 19.8 mg/dL (ref 0.0–40.0)

## 2017-11-06 LAB — HEMOGLOBIN A1C: Hgb A1c MFr Bld: 7.3 % — ABNORMAL HIGH (ref 4.6–6.5)

## 2017-11-11 ENCOUNTER — Encounter: Payer: Self-pay | Admitting: Endocrinology

## 2017-11-11 ENCOUNTER — Ambulatory Visit: Payer: Medicare Other | Admitting: Endocrinology

## 2017-11-11 VITALS — BP 142/86 | HR 86 | Ht 74.0 in | Wt 265.0 lb

## 2017-11-11 DIAGNOSIS — N289 Disorder of kidney and ureter, unspecified: Secondary | ICD-10-CM

## 2017-11-11 DIAGNOSIS — E1165 Type 2 diabetes mellitus with hyperglycemia: Secondary | ICD-10-CM | POA: Diagnosis not present

## 2017-11-11 DIAGNOSIS — I10 Essential (primary) hypertension: Secondary | ICD-10-CM | POA: Diagnosis not present

## 2017-11-11 DIAGNOSIS — Z23 Encounter for immunization: Secondary | ICD-10-CM

## 2017-11-11 DIAGNOSIS — Z794 Long term (current) use of insulin: Secondary | ICD-10-CM

## 2017-11-11 DIAGNOSIS — E1142 Type 2 diabetes mellitus with diabetic polyneuropathy: Secondary | ICD-10-CM
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
## 2017-11-11 NOTE — Patient Instructions (Addendum)
Take 8 N insulin at dinner and 18 in am  No sweet tea  Smaller portions  Check blood sugars on waking up days a week  Also check blood sugars about 2 hours after meals and do this after different meals by rotation  Recommended blood sugar levels on waking up are 90-130 and about 2 hours after meal is 130-160  Please bring your blood sugar monitor to each visit, thank you

## 2017-11-13 ENCOUNTER — Ambulatory Visit (INDEPENDENT_AMBULATORY_CARE_PROVIDER_SITE_OTHER): Payer: Medicare Other | Admitting: Dietician

## 2017-11-13 ENCOUNTER — Ambulatory Visit: Payer: Medicare Other | Admitting: Internal Medicine

## 2017-11-13 ENCOUNTER — Encounter: Payer: Self-pay | Admitting: Internal Medicine

## 2017-11-13 ENCOUNTER — Encounter: Payer: Self-pay | Admitting: Dietician

## 2017-11-13 VITALS — BP 142/61 | HR 64 | Temp 97.6°F | Wt 267.4 lb

## 2017-11-13 DIAGNOSIS — G8929 Other chronic pain: Secondary | ICD-10-CM

## 2017-11-13 DIAGNOSIS — E1142 Type 2 diabetes mellitus with diabetic polyneuropathy: Secondary | ICD-10-CM

## 2017-11-13 DIAGNOSIS — R351 Nocturia: Secondary | ICD-10-CM

## 2017-11-13 DIAGNOSIS — E785 Hyperlipidemia, unspecified: Secondary | ICD-10-CM

## 2017-11-13 DIAGNOSIS — K219 Gastro-esophageal reflux disease without esophagitis: Secondary | ICD-10-CM

## 2017-11-13 DIAGNOSIS — E669 Obesity, unspecified: Secondary | ICD-10-CM

## 2017-11-13 DIAGNOSIS — I110 Hypertensive heart disease with heart failure: Secondary | ICD-10-CM | POA: Diagnosis not present

## 2017-11-13 DIAGNOSIS — I739 Peripheral vascular disease, unspecified: Secondary | ICD-10-CM

## 2017-11-13 DIAGNOSIS — K449 Diaphragmatic hernia without obstruction or gangrene: Secondary | ICD-10-CM

## 2017-11-13 DIAGNOSIS — N35919 Unspecified urethral stricture, male, unspecified site: Secondary | ICD-10-CM | POA: Insufficient documentation

## 2017-11-13 DIAGNOSIS — E7800 Pure hypercholesterolemia, unspecified: Secondary | ICD-10-CM

## 2017-11-13 DIAGNOSIS — J3089 Other allergic rhinitis: Secondary | ICD-10-CM

## 2017-11-13 DIAGNOSIS — K429 Umbilical hernia without obstruction or gangrene: Secondary | ICD-10-CM

## 2017-11-13 DIAGNOSIS — I5022 Chronic systolic (congestive) heart failure: Secondary | ICD-10-CM

## 2017-11-13 DIAGNOSIS — Z794 Long term (current) use of insulin: Secondary | ICD-10-CM

## 2017-11-13 DIAGNOSIS — N3941 Urge incontinence: Secondary | ICD-10-CM

## 2017-11-13 DIAGNOSIS — Z7982 Long term (current) use of aspirin: Secondary | ICD-10-CM

## 2017-11-13 DIAGNOSIS — Z0000 Encounter for general adult medical examination without abnormal findings: Secondary | ICD-10-CM | POA: Insufficient documentation

## 2017-11-13 DIAGNOSIS — R809 Proteinuria, unspecified: Secondary | ICD-10-CM

## 2017-11-13 DIAGNOSIS — Z8546 Personal history of malignant neoplasm of prostate: Secondary | ICD-10-CM

## 2017-11-13 DIAGNOSIS — E1151 Type 2 diabetes mellitus with diabetic peripheral angiopathy without gangrene: Secondary | ICD-10-CM

## 2017-11-13 DIAGNOSIS — I2510 Atherosclerotic heart disease of native coronary artery without angina pectoris: Secondary | ICD-10-CM

## 2017-11-13 DIAGNOSIS — E66811 Obesity, class 1: Secondary | ICD-10-CM

## 2017-11-13 DIAGNOSIS — Z79899 Other long term (current) drug therapy: Secondary | ICD-10-CM

## 2017-11-13 DIAGNOSIS — I10 Essential (primary) hypertension: Secondary | ICD-10-CM

## 2017-11-13 DIAGNOSIS — N401 Enlarged prostate with lower urinary tract symptoms: Secondary | ICD-10-CM

## 2017-11-13 DIAGNOSIS — E1129 Type 2 diabetes mellitus with other diabetic kidney complication: Secondary | ICD-10-CM | POA: Insufficient documentation

## 2017-11-13 DIAGNOSIS — M25512 Pain in left shoulder: Secondary | ICD-10-CM

## 2017-11-13 HISTORY — DX: Benign prostatic hyperplasia with lower urinary tract symptoms: N40.1

## 2017-11-13 HISTORY — DX: Obesity, class 1: E66.811

## 2017-11-13 HISTORY — DX: Other chronic pain: G89.29

## 2017-11-13 HISTORY — DX: Long term (current) use of insulin: R80.9

## 2017-11-13 HISTORY — DX: Unspecified urethral stricture, male, unspecified site: N35.919

## 2017-11-13 HISTORY — DX: Type 2 diabetes mellitus with other diabetic kidney complication: E11.29

## 2017-11-13 HISTORY — DX: Obesity, unspecified: E66.9

## 2017-11-13 HISTORY — DX: Urge incontinence: N39.41

## 2017-11-13 MED ORDER — LORATADINE 10 MG PO TABS: 10.0000 mg | ORAL_TABLET | Freq: Every day | ORAL | 3 refills | Status: DC | PRN

## 2017-11-13 MED ORDER — HYDROCHLOROTHIAZIDE 25 MG PO TABS: 25.0000 mg | ORAL_TABLET | Freq: Every day | ORAL | 3 refills | Status: DC

## 2017-11-13 NOTE — Patient Instructions (Signed)
Please record the time, amount and what food drinks and activities you have while wearing the continuous glucose monitor(CGM).  Bring the folder with you to follow up appointments  Do not have a CT or an MRI while wearing the CGM.   Please make an appointment for 1 week with me and a doctor for the first of two CGM downloads..   You will also return in 2 weeks to have your second download and the CGM remove  Donna [phone] 

## 2017-11-13 NOTE — Assessment & Plan Note (Signed)
Assessment  He was seen by his endocrinologist, Dr. Dwyane Dee, last week where he was noted to have a hemoglobin A1c of 7.3.  Unfortunately, he also had hypoglycemic episodes and his insulin regimen was modified.  He is currently on ReliOn N U-100 18 units in the morning and 8 units at dinner.  He is also currently on ReliOn R U-100 8 units prior to each meal.  Finally, he is taking metformin XR 1000 mg by mouth daily.  Review of his downloaded meter readings reveal that his highs are in the evening hours.  He states that in the past he was on Lantus and NovoLog with much better control of his sugars.  He is unsure why the insulin regimen was changed to the NPH and regular.  With regards to his peripheral neuropathy he states that the discomfort associated with the numbness in the toes is relieved with gabapentin 100 mg by mouth 3 times daily.  Plan  We will continue the ReliOn N U-100 18 units in the morning and 8 units with dinnertime and ReliOn R U-100 8 units prior to each meal.  We will also continue the Metformin XR 1000 mg by mouth daily.  Given the difficulty in controlling the evening sugars we will place a continuous glucose monitor and have him return in 2 weeks for reassessment and adjustment of his insulin regimen.  We may also consider converting back to the Lantus and NovoLog if this seems like the best thing to do.  For the peripheral neuropathy we will continue the gabapentin 100 mg by mouth 3 times daily and reassess the efficacy of this therapy at the follow-up visit.  We have also placed an ambulatory referral to ophthalmology for a diabetic retinal examination.  He is otherwise up-to-date on his diabetic healthcare maintenance.

## 2017-11-13 NOTE — Assessment & Plan Note (Signed)
Assessment  He denies any dyspnea on exertion, orthopnea, or paroxysmal nocturnal dyspnea.  His current heart failure regimen includes losartan 50 mg by mouth daily and metoprolol 25 mg by mouth twice daily.  Plan  We will continue the losartan at 50 mg by mouth daily and metoprolol at 25 mg by mouth twice daily.  If his blood pressure remains above target at follow-up we may increase the losartan to 100 mg by mouth daily.  We will reassess for symptomatic systolic heart failure at the follow-up visit.

## 2017-11-13 NOTE — Assessment & Plan Note (Signed)
Assessment  As best I can tell from record review it appears that his last PSA was obtained April 10, 2016 and was undetectable.  Plan  We will repeat the PSA at the time we recheck the potassium on November 27, 2017 when he returns for further evaluation.

## 2017-11-13 NOTE — Addendum Note (Signed)
Addended by: Resa Miner on: 11/13/2017 03:18 PM   Modules accepted: Orders

## 2017-11-13 NOTE — Assessment & Plan Note (Signed)
Assessment  He has nocturia about every 3 hours.  He also notes urgency.  He is on Ditropan for his urge incontinence and tamsulosin for his benign prostatic hypertrophy.  Review of the records from [place] reveals that he also has a urethral stricture for which he reportedly was asked to perform intermittent catheterizations.  Apparently he has not been doing this.  Plan  We will continue with the oxybutynin 5 mg by mouth 3 times daily and tamsulosin 0.4 mg by mouth daily.  If his symptoms were to worsen we would consider referral to a urologist for reassessment and recommendations.

## 2017-11-13 NOTE — Assessment & Plan Note (Signed)
Assessment  His gastroesophageal reflux symptoms are well controlled on omeprazole 20 mg by mouth daily.  Plan  We will continue the omeprazole at 20 mg by mouth daily and reassess the efficacy of this therapy at the follow-up visit.

## 2017-11-13 NOTE — Progress Notes (Signed)
Documentation for Freestyle Libre Pro Continuous glucose monitoring Freestyle Libre Pro CGM sensor placed and started on [name] who was identified by name and date of birth.  Patient was educated about wearing sensor, keeping food, activity and medication log and when to call office.She was educated about how to care for the sensor and not to have an MRI, CT or Diathermy while wearing the sensor. Follow up was arranged with the patient for 2 weeks.   Lot #:[id number] Serial #:[id number] Expiration Date:06/03/2018 [name], RD 11/13/2017 1:49 PM.

## 2017-11-13 NOTE — Assessment & Plan Note (Signed)
Assessment  He denies any angina.  I am not sure if he ever has had angina post CABG.  Plan  Regardless, we will continue the amlodipine 10 mg by mouth daily and metoprolol 25 mg by mouth twice daily.  We are also continuing the antiplatelet therapy with aspirin 81 mg by mouth daily and lipid-lowering therapy with atorvastatin 40 mg by mouth daily.  We will reassess for symptoms consistent with angina at the follow-up visit.

## 2017-11-13 NOTE — Assessment & Plan Note (Signed)
Assessment  His blood pressure today is above target at 142/61.  This is on amlodipine 10 mg by mouth daily, hydrochlorothiazide 25 mg by mouth daily, losartan 50 mg by mouth daily, and metoprolol 25 mg by mouth twice daily.  He is tolerating this regimen well.  Plan  We will continue the amlodipine at 10 mg by mouth daily, hydrochlorothiazide at 25 mg by mouth daily, losartan 50 mg by mouth daily, and metoprolol at 25 mg by mouth twice daily.  If his blood pressure remains above target at follow-up visit we will increase the losartan to 100 mg by mouth daily.  I asked him to hold his potassium and we will check a basic metabolic panel at the follow-up visit to determine whether or not he needs to continue with the potassium chloride supplementation.  I suspect the reason for the hypokalemia is the hydrochlorothiazide.

## 2017-11-13 NOTE — Assessment & Plan Note (Addendum)
Assessment  He currently notes that he has got sinus pressure between the eyes and associated postnasal drip.  He states that he takes as needed loratadine, but has recently run out.  Plan  I represcribed the loratadine 10 mg by mouth every 24 hours as needed for persistent sinus pressure.  We will reassess the efficacy of this therapy at the follow-up visit.

## 2017-11-13 NOTE — Assessment & Plan Note (Signed)
Assessment  He is tolerating the atorvastatin 40 mg by mouth daily without myalgias.  Plan  We will continue with the high intensity statin and reassess for intolerances at the follow-up visit.

## 2017-11-13 NOTE — Assessment & Plan Note (Signed)
Assessment  He continues to have exertional claudication that limits his exercise.  The claudication occurs in both legs.  He takes cilostazol twice daily and feels this may help his claudication some.  Plan  We discussed what could be done for his claudication.  He has failed a PTCA attempt in the left lower extremity in the past.  He was advised to walk daily to the point of pain.  If he were to continue to do this he may be able to progressively increase the length that he could walk prior to experiencing pain.  He will also continue the cilostazol twice daily.  Finally, we will continue with the aggressive management of his hypertension and diabetes.  We will assess for symptoms of progressive claudication, rest pain, or lower extremity lesions at follow-up.

## 2017-11-13 NOTE — Progress Notes (Signed)
   Subjective:    Patient ID: [name] [name], male    DOB: [birth year], 84 y.o.   MRN: [id number]  HPI  [name] [name] is here for follow-up of his type 2 diabetes complicated by peripheral neuropathy, peripheral arterial disease, chronic systolic heart failure, essential hypertension, hyperlipidemia, coronary artery disease without angina, chronic non-seasonal allergic rhinitis, gastroesophageal disease with hiatal hernia, benign prostatic hyperplasia with urge incontinence, and history of prostate cancer. Please see the A&P for the status of the pt's chronic medical problems.  Review of Systems  Constitutional: Negative for activity change and appetite change.  HENT: Positive for congestion, postnasal drip and sinus pressure.        Pressure in ethmoid area with posterior drainage.  Respiratory: Negative for chest tightness, shortness of breath and wheezing.   Cardiovascular: Negative for chest pain and palpitations.  Gastrointestinal: Negative for abdominal pain, anal bleeding, blood in stool, constipation, diarrhea, nausea and vomiting.  Genitourinary: Positive for frequency and urgency. Negative for dysuria.       Urge incontinence requiring depends underwear. Also has nocturia Q3 hours at night.  Musculoskeletal: Positive for arthralgias. Negative for back pain, gait problem, joint swelling, myalgias and neck pain.       Chronic left shoulder pain unchanged from baseline.  Skin: Negative for rash and wound.  Allergic/Immunologic: Negative for environmental allergies.  Neurological: Positive for numbness. Negative for dizziness, syncope, weakness and light-headedness.       Numbness in toes, improves with gabapentin.  Psychiatric/Behavioral: Positive for sleep disturbance. Negative for dysphoric mood. The patient is not nervous/anxious.        Frequent awakenings secondary to nocturia.      Objective:   Physical Exam  Constitutional: He is oriented to person, place, and time.  He appears well-developed and well-nourished. No distress.  HENT:  Head: Normocephalic and atraumatic.  Eyes: Right eye exhibits no discharge. Left eye exhibits no discharge. No scleral icterus.  Cardiovascular: Normal rate, regular rhythm and normal heart sounds. Exam reveals no gallop and no friction rub.  No murmur heard. Pulmonary/Chest: Effort normal and breath sounds normal. No stridor. No respiratory distress. He has no wheezes. He has no rales.  Abdominal: Soft. Bowel sounds are normal. He exhibits no distension. There is no tenderness. There is no rebound and no guarding. A hernia is present.  Small umbilical hernia  Musculoskeletal: Normal range of motion. He exhibits edema. He exhibits no tenderness or deformity.  RLE edema > LLE edema  Neurological: He is alert and oriented to person, place, and time. He exhibits normal muscle tone.  Skin: Skin is warm and dry. No rash noted. He is not diaphoretic. No erythema.  Psychiatric: He has a normal mood and affect. His behavior is normal. Judgment and thought content normal.  Nursing note and vitals reviewed.     Assessment & Plan:   Please see problem oriented charting.

## 2017-11-13 NOTE — Patient Instructions (Signed)
It was nice to meet you.  I look forward to taking care of you for years to come.  1) We will set you up with a continuous glucose monitor.  It measures your sugar for 2 weeks.  This will help [place] adjust your insulin and maybe even change it back to lanutis.  2) I will send a referral to an eye doctor for an eye examination given your diabetes.  3) Stop taking your potassium and we will check the level at the next visit.  4) To clarify the dose of the insulin Dr. Dwyane Dee wanted you on:  Novulin N (cloudy) 18 units in the morning and 8 units at dinner time  Novulin R (clear) 8 units before breakfast, lunch, and dinner  5) We will see you back in 2 weeks (Friday, October 25) to check your glucose monitor.  We will get a potassium level at this visit.  I will see you in 3 months for a follow-up.  Sooner if necessary.

## 2017-11-25 ENCOUNTER — Ambulatory Visit: Payer: Medicare Other | Admitting: Cardiovascular Disease

## 2017-11-25 ENCOUNTER — Encounter: Payer: Self-pay | Admitting: Cardiovascular Disease

## 2017-11-25 VITALS — BP 114/60 | HR 67 | Ht 74.0 in | Wt 264.0 lb

## 2017-11-25 DIAGNOSIS — I2510 Atherosclerotic heart disease of native coronary artery without angina pectoris: Secondary | ICD-10-CM | POA: Diagnosis not present

## 2017-11-25 DIAGNOSIS — E782 Mixed hyperlipidemia: Secondary | ICD-10-CM

## 2017-11-25 DIAGNOSIS — I739 Peripheral vascular disease, unspecified: Secondary | ICD-10-CM

## 2017-11-25 NOTE — Patient Instructions (Signed)

## 2017-11-25 NOTE — Progress Notes (Signed)
[name] [name] Date of Birth  October 29, 1931 [facility] / [facility] [address]     [place], [place]  [address] [phone]  Fax  [phone]   Problem List 1. CAD - status post stenting and   status post coronary artery bypass grafting 2. Peripheral vascular disease 3. Diabetes mellitus 4. Hypertension 5. Hyperlipidemia    [name] [name] is a 84 y.o. gentleman with a Hx of CAD - s/p stenting, diabetes mellitus, hypertension and hyperlipidemia. He presents today with the complaint of midsternal chest pain. This typically occurs after he eats some food and then goes out and doesn't work. It resolves after about 30 minutes.  He did not take any nitroglycerin. He took some Prilosec and it eventually resolved.   Jun 29, 2013:  [name] [name] has had CABG since I last saw him.    His CABG was complicated by several things - hematura and now he has urinary incontinence and wears depends.  Nov. 24, 2015:  [name] [name] is s/p CABG. He has CAD, DM and now has urninary incontinence.   Has seen Dr. Jeffie Pollock who does not think there are any options for his incontence.   September 18, 2014:  Doing well.  Has some indigestion .   Has seen Dr. Gwenlyn Found for peripheral vascular disease.  He high-grade segmental right and total left SFA stenosis with single-vessel runoff below the knees bilaterally.   Dr. Gwenlyn Found attempted unsuccessfully to percutaneously revascularize his left SFA.  Feb. 13, 2017:  No CP , No dyspnea  Dr. Gwenlyn Found was not able to revascularized his left SFA.    Has leg pain with walking - right > left ,  Aug. 16, 2017:  [name] [name] is seen today for follow-up visit. He has a history of coronary artery disease and peripheral vascular disease. Has seen Dr. Gwenlyn Found - attempted SFA revascularization but it was unsuccessful.   No cardiac complaints. Has significant claudication with any walking .  Has gotten some relief with gabapentin - causes a dry  mouth  Feb. 12, 2018:  Still eating some salty foods.  Some hot dogs, canned food, sausage  His blood pressure has been a little elevated and he has been having headaches on a regular basis.  Sept. 28, 2018:  [name] [name]  is feeling better. He's cut out a lot of his salty foods. Blood pressure is well-controlled. He thinks that one of his meds is causing a dry mouth may be causing him to have a dry mouth.   May 19, 2017  [name] [name] is doing well .  No CP or dyspnea.    Some pain in right leg with walking - if he goes to far.  Able to do most of his usual activities without too much trouble   November 25, 2017:  [name] [name] is seen today for follow-up visit.  He has a history of coronary artery disease and peripheral vascular disease. He has a history of hypertension and hyperlipidemia. No CP or dyspnea.   Has left shoulder pain when the weather is cold .  Has some claudicatin when he walks.  Current Outpatient Medications on File Prior to Visit  Medication Sig Dispense Refill  . amLODipine (NORVASC) 10 MG tablet Take 1 tablet (10 mg total) by mouth daily. 90 tablet 1  . aspirin EC 81 MG tablet Take 1 tablet (81 mg total) by mouth daily.    [name] atorvastatin (LIPITOR) 40 MG tablet TAKE 1 TABLET BY MOUTH ONCE DAILY 30 tablet 9  .  cilostazol (PLETAL) 50 MG tablet Take 50 mg by mouth 2 (two) times daily.    [name] gabapentin (NEURONTIN) 100 MG capsule Take 1 capsule (100 mg total) by mouth 3 (three) times daily. 90 capsule 3  . glucose blood (ONETOUCH VERIO) test strip USE AS INSTRUCTED TO CHECK BLOOD SUGAR TWICE DAILY 100 each 2  . hydrochlorothiazide (HYDRODIURIL) 25 MG tablet Take 1 tablet (25 mg total) by mouth daily. 90 tablet 3  . insulin NPH Human (HUMULIN N,NOVOLIN N) 100 UNIT/ML injection Inject 8-18 Units into the skin 2 (two) times daily. Inject 18 units under the skin in the morning and 8 units under the skin at dinnertime    . Insulin Regular Human (NOVOLIN R RELION IJ) Inject 8  Units as directed 3 (three) times daily before meals.    [name] loratadine (ALLERGY RELIEF) 10 MG tablet Take 1 tablet (10 mg total) by mouth daily as needed for rhinitis. 90 tablet 3  . losartan (COZAAR) 50 MG tablet Take 1 tablet (50 mg total) by mouth daily. 30 tablet 3  . metFORMIN (GLUCOPHAGE-XR) 500 MG 24 hr tablet Take 2 tablets (1,000 mg total) by mouth daily with supper. 60 tablet 4  . metoprolol tartrate (LOPRESSOR) 25 MG tablet Take 1 tablet (25 mg total) by mouth 2 (two) times daily. 60 tablet 5  . omeprazole (PRILOSEC) 20 MG capsule Take 1 capsule by mouth daily.    Glory Rosebush DELICA LANCETS FINE MISC Use to check blood sugar 2 times per day dx code E11.49 100 each 3  . oxybutynin (DITROPAN) 5 MG tablet Take 5 mg by mouth 3 (three) times daily.    . potassium chloride (K-DUR) 10 MEQ tablet Take 1 tablet (10 mEq total) by mouth daily. 90 tablet 1  . tamsulosin (FLOMAX) 0.4 MG CAPS capsule Take 1 capsule by mouth daily.     No current facility-administered medications on file prior to visit.     No Known Allergies  Past Medical History:  Diagnosis Date  . Benign prostatic hyperplasia (BPH) with urinary urge incontinence 11/13/2017  . Chronic left shoulder pain 11/13/2017  . Chronic non-seasonal allergic rhinitis 02/17/2006  . Chronic systolic heart failure ([place]) 10/31/2016   Echo (05/03/2015): LVEF 68-12%, grade 2 diastolic dysfunction  . 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 Essential hypertension 11/04/2005  . Gastroesophageal reflux disease with hiatal hernia 11/04/2005  . History of prostate cancer 02/17/2006   Diagnosed 1995, s/p seed implantation in the late 1990's, PSA undetectable 04/10/2016  . Hyperlipidemia  11/04/2005  . Obesity (BMI 30.0-34.9) 11/13/2017  . Peripheral arterial disease ([place]) 09/30/2007   with claudication, failed attempt at LLE PTCA  . Type 2 diabetes mellitus with microalbuminuria, with long-term current use of insulin ([place]) 11/13/2017  . Type 2 diabetes mellitus with peripheral neuropathy ([place]) 11/04/2005  . Urethral stricture in male 11/13/2017   Noted on cystoscopy 12/18/2014.  Reportedly asked to do chronic intermittent self catheterizations, but has not.    Past Surgical History:  Procedure Laterality Date  . CARDIAC CATHETERIZATION  10/05/08   REVEALS HYPOKINESIS OF THE LATERAL WALL AND EF 50-55%  . CORONARY ANGIOPLASTY WITH STENT PLACEMENT    . CORONARY ARTERY BYPASS GRAFT N/A 01/05/2013   Procedure: CORONARY ARTERY BYPASS GRAFTING (  CABG);  Surgeon: Melrose Nakayama, MD;  Location: [facility];  Service: Open Heart Surgery;  Laterality: N/A;  . INTRAOPERATIVE TRANSESOPHAGEAL ECHOCARDIOGRAM N/A 01/05/2013   Procedure: INTRAOPERATIVE TRANSESOPHAGEAL ECHOCARDIOGRAM;  Surgeon: Melrose Nakayama, MD;  Location: [facility];  Service: Open Heart Surgery;  Laterality: N/A;  . LEFT HEART CATHETERIZATION WITH CORONARY ANGIOGRAM N/A 01/03/2013   Procedure: LEFT HEART CATHETERIZATION WITH CORONARY ANGIOGRAM;  Surgeon: Lorretta Harp, MD;  Location: [facility] CATH LAB;  Service: Cardiovascular;  Laterality: N/A;  . LOWER EXTREMITY ANGIOGRAM Right 12/07/2012   unsuccessful attempt at percutaneous revascularization of a calcified long segment chronic total occlusion mid left SFA /notes 12/07/2012  . LOWER EXTREMITY ANGIOGRAM N/A 12/07/2012   Procedure: LOWER EXTREMITY ANGIOGRAM;  Surgeon: Lorretta Harp, MD;  Location: [facility] CATH LAB;  Service: Cardiovascular;  Laterality: N/A;  . PROSTATE SURGERY  1990's    Social History   Tobacco Use  Smoking Status Former Smoker  . Packs/day: 0.75  . Years: 20.00  . Pack years: 15.00  . Types: Cigarettes  . Last attempt to quit: 07/08/1980  . Years since  quitting: 37.4  Smokeless Tobacco Never Used    Social History   Substance and Sexual Activity  Alcohol Use No  . Alcohol/week: 0.0 standard drinks   Comment: 12/07/2012 "quit drinking alcohol > 25 yr ago"    Family History  Problem Relation Age of Onset  . Kidney failure Mother   . Hypertension Father   . Stroke Father   . Other Sister   . Other Brother   . Other Brother   . Other Brother   . Other Brother   . Other Brother   . Other Brother   . Other Brother   . Other Brother   . Healthy Daughter   . Healthy Daughter   . Healthy Daughter   . Healthy Son     Reviw of Systems:  Reviewed in the HPI.  All other systems are negative.  Physical Exam: Blood pressure 114/60, pulse 67, height 6\' 2"  (1.88 m), weight 264 lb (119.7 kg), SpO2 97 %.  GEN:  Well nourished, well developed in no acute distress HEENT: Normal NECK: No JVD; No carotid bruits LYMPHATICS: No lymphadenopathy CARDIAC: RRR   RESPIRATORY:  Clear to auscultation without rales, wheezing or rhonchi  ABDOMEN: Soft, non-tender, non-distended MUSCULOSKELETAL:  No edema; No deformity  SKIN: Warm and dry NEUROLOGIC:  Alert and oriented x 3   ECG: November 25, 2017: Normal sinus rhythm at 66 bpm.  Rare premature ventricular contractions.  Otherwise normal EKG.  Assessment / Plan:   1. CAD -     No angina , he is doing very well.  Continue current medications. . 2. Peripheral vascular disease -    Mild claudication ,  No severe Pain is Better with gabapentin  He is been seen by Dr. Gwenlyn Found in the past.  I have asked [place] to give [place] a call if he has worsening episodes of claudication.  3. Diabetes mellitus -   4. Hypertension -   Well controlled   5. Hyperlipidemia  -   Recent labs look great , cont. Atorvastatin   6. Chronic systolic congestive heart failure - stable     Mertie Moores, MD  11/25/2017 8:58 AM    [facility] [place],  [place], [place]   [address] Pager [phone] Phone: [phone]; Fax: [phone]

## 2017-11-27 ENCOUNTER — Ambulatory Visit: Payer: Medicare Other | Admitting: Oncology

## 2017-11-27 ENCOUNTER — Ambulatory Visit (INDEPENDENT_AMBULATORY_CARE_PROVIDER_SITE_OTHER): Payer: Medicare Other | Admitting: Dietician

## 2017-11-27 ENCOUNTER — Encounter: Payer: Self-pay | Admitting: Dietician

## 2017-11-27 ENCOUNTER — Other Ambulatory Visit: Payer: Self-pay | Admitting: Dietician

## 2017-11-27 ENCOUNTER — Ambulatory Visit: Payer: Medicare Other | Admitting: Cardiovascular Disease

## 2017-11-27 DIAGNOSIS — R809 Proteinuria, unspecified: Secondary | ICD-10-CM | POA: Diagnosis not present

## 2017-11-27 DIAGNOSIS — E1129 Type 2 diabetes mellitus with other diabetic kidney complication: Secondary | ICD-10-CM

## 2017-11-27 DIAGNOSIS — Z794 Long term (current) use of insulin: Secondary | ICD-10-CM

## 2017-11-27 DIAGNOSIS — Z713 Dietary counseling and surveillance: Secondary | ICD-10-CM | POA: Diagnosis not present

## 2017-11-27 DIAGNOSIS — N3941 Urge incontinence: Secondary | ICD-10-CM

## 2017-11-27 DIAGNOSIS — E1142 Type 2 diabetes mellitus with diabetic polyneuropathy: Secondary | ICD-10-CM

## 2017-11-27 DIAGNOSIS — Z6833 Body mass index (BMI) 33.0-33.9, adult: Secondary | ICD-10-CM

## 2017-11-27 DIAGNOSIS — N401 Enlarged prostate with lower urinary tract symptoms: Secondary | ICD-10-CM

## 2017-11-27 DIAGNOSIS — I10 Essential (primary) hypertension: Secondary | ICD-10-CM

## 2017-11-27 MED ORDER — "INSULIN SYRINGE-NEEDLE U-100 31G X 15/64"" 0.3 ML MISC": 7 refills | Status: DC

## 2017-11-27 NOTE — Patient Instructions (Addendum)
Please record the time, amount and what food drinks and activities you have while wearing the continuous glucose monitor(CGM).  Bring the folder with you to follow up appointments  Do not have a CT or an MRI while wearing the CGM.   Please make an appointment for 1 week with me and a doctor for the first of two CGM downloads..   You will also return in 2 weeks to have your second download and the CGM remove  I will request a syringe prescription so your insurance will cover their part.   Try to drink more water and less juice. One cup of coffee and one cup of tea a day are fine to have.  [name] [phone]

## 2017-11-27 NOTE — Telephone Encounter (Signed)
[name] requests refills on potassium Chloride and insulin syringes. He says he is not sure if Dr. Eppie Gibson wants him to take the potassium Chloride once it runs out.

## 2017-11-27 NOTE — Progress Notes (Signed)
Medical Nutrition Therapy:  Appt start time: 0915 end time:  1030. Visit # 1  Assessment:  Primary concerns today: Continuous glucose monitoring/glycemic control [name] [name] presents for Continuous glucose monitoring download.  Unfortunately the sensor did not record anything. He thinks it came loose although it was adhered to him at today's visit.  The sensor was replaced today and a cover put over it. He kept excellent food records. He did not want to see a doctor today without sensor information. He is concerned about copays. He kept excellent food records that show good intake of nutrient dense foods. However, he drinks sugar sweetened beverages several times a day in fruit juices. His weight is up and down between 240# and 260s over the past 12 years. A Reasonable body weight for him is ~240s  Documentation for Freestyle Libre Pro Continuous glucose monitoring replacement sensor Freestyle Libre Pro CGM sensor placed today. Patient was educated about wearing sensor, keeping food, activity and medication log and when to call office. Patient was educated about how to care for the sensor and not to have an MRI, CT or Diathermy while wearing the sensor. Follow up was arranged with the patient for 1 week.  This took 20 minutes of the above visit.  Lot #: [id number] A Serial #: [id number] Expiration Date: 06/03/2018  [name] [name], RD 11/27/2017 11:40 AM.  Preferred Learning Style: No preference indicated  Learning Readiness: Ready and Change in progress  ANTHROPOMETRICS:Estimated body mass index is 33.9 kg/m as calculated from the following:   Height as of 11/25/17: 6\' 2"  (1.88 m).   Weight as of 11/25/17: 264 lb (119.7 kg).   WEIGHT HISTORY:  Wt Readings from Last 5 Encounters:  11/25/17 264 lb (119.7 kg)  11/13/17 267 lb 6.4 oz (121.3 kg)  11/11/17 265 lb (120.2 kg)  09/07/17 253 lb 6.4 oz (114.9 kg)  08/12/17 244 lb 6.4 oz (110.9 kg)   SLEEP:need to assess at future visit DIETARY  INTAKE: Usual eating pattern includes 3 meals and ? snacks per day. Everyday foods include  Water, juice, oatmeal, fruit, vegetables, beans Constipation: need to assess at future visit Dining Out (times/week): MEDICATIONS: Humulin N 18 units QAM, and unsure if he is taking 8 units with dinner as well.  He take 8 units Novolin R with all 3 meals, He demonstrated fair technique of drawing up and injecting using a vial and insulin syringe today. He uses his abdomen for all injections, reuses insulin syringes often and rotates sites. He stopped at 17 units rather than 18 when drawing up his Humilin N.  We discussed that this could cause variation in his blood sugars and insulin pens could help him with accuracy of his insulin injections. He agreed, but is not interested in changing to insulin pens.   HYPOGLYCEMIA: He denies any signs or symptoms in the past 2 weeks BLOOD SUGAR: He does not want to check his blood sugar more than 2 times a day Lab Results  Component Value Date   HGBA1C 7.3 (H) 11/06/2017   METER DOWNLOAD Report summary is from last  30 days,  Average tests per day: 2 Average blood glucose: 152 Range: minimum: 65 and maximum: 324 Days without test: 0 % in target range:43  % below target range: 0.02 hypoglycemia:1 Notes about patterns: average for past 14 days is now 145 since second dose of N dropped. 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   Nutritional Diagnosis:  NB-1.4 Self-monitoring deficit As related to checking blood sugar twice a day.  As evidenced by his report of not wanting to check more frequently despite 4 injections of insulin oer day and not knowing what his blood sugars are doing between checks and  overnight. .    Intervention:  Nutrition education about  Continuous glucose monitoring,  insulin injection, rotation, how medicare works and what diabetes supplies it helps pay for, diabetes meal planning and  Coordination of care: request refills, he would benefit from using insulin pens so that he is always using insulin in date. Currently his insulin vials will last longer than the 30 days recommended discard date. Consider adding a glp-1 (xultophy would be ideal for him to simplify his regimen,  decrease injections and risk of hypoglycemia  Teaching Method Utilized: Visual,Auditory,Hands on Handouts given during visit include:after visit summary Barriers to learning/adherence to lifestyle change: competing values Demonstrated degree of understanding via:  Teach Back   Monitoring/Evaluation:  Dietary intake, exercise, meter, and body weight in 1 week(s). [name] [name], RD 11/27/2017 11:41 AM.

## 2017-12-04 ENCOUNTER — Ambulatory Visit (INDEPENDENT_AMBULATORY_CARE_PROVIDER_SITE_OTHER): Payer: Medicare Other | Admitting: Dietician

## 2017-12-04 DIAGNOSIS — Z794 Long term (current) use of insulin: Secondary | ICD-10-CM | POA: Diagnosis not present

## 2017-12-04 DIAGNOSIS — R809 Proteinuria, unspecified: Secondary | ICD-10-CM

## 2017-12-04 DIAGNOSIS — E1129 Type 2 diabetes mellitus with other diabetic kidney complication: Secondary | ICD-10-CM | POA: Diagnosis not present

## 2017-12-04 DIAGNOSIS — Z713 Dietary counseling and surveillance: Secondary | ICD-10-CM | POA: Diagnosis not present

## 2017-12-04 NOTE — Progress Notes (Signed)
  Medical Nutrition Therapy:  Appt start time: 1030 end time:  1120. Visit # 2  Assessment:  Primary concerns today: Continuous glucose monitoring/glycemic control [name] [name] presents for Continuous glucose monitoring download #1CGM.   Results from [place] #1 Average is  132  for 8 days Time in range (70-180 mg/dL): 81% (Goal >70%) Time above range (>180 mg/dL) 12 % Time below range (<70 mg/dL): 7% (Goal is <3%) Coefficient of variation:33.6% (Goal is <36%) Standard variation: 44.4 mg/dL (Goal is <50 mg/dL)  Summary: he has very well controlled diabetes. It may be too tight of control for someone his age on insulin. Of concern is the 7% hypoglycemia of which is has no reported symptoms, only knows when he catches it on his meter. The highest risk for hypoglycemia is from 5PM to 5PM.  After discussion with [name] [name] and reviewing his food and activity record, most of his low blood sugar were from not eating enough at dinner and taking his lunch time insulin after he gets back home from eating lunch out and doing this once or twice after his evening meal (He was not sure which days or how many times this occurred.)  Slight adjustments were made in his insulin per [name] [name] approval with instruction to skip the mealtime dose if he does not take it before the meal.  He would prefer insulin pens but is concerned about the copays and is doing well with   MEDICATIONS: Humulin N 18 units QAM.  He take 8 units Novolin R with all 3 meals. We discussed mixing insulin today but he said he will probably continue doing what he was doing.  HYPOGLYCEMIA: He denies any signs or symptoms in the past week  ANTHROPOMETRICS:not done today SLEEP:need to assess at future visit DIETARY INTAKE: Usual eating pattern includes 3 meals and ? snacks per day. 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   Nutritional Diagnosis:  NB-1.4 Self-monitoring deficit As related to checking blood sugar twice a day.  As evidenced by his report of not wanting to check more frequently despite 4 injections of insulin oer day and not knowing what his blood sugars are doing between checks and  overnight.     Intervention:  Nutrition education about  Continuous glucose monitoring, mixing insulin, diabetes meal planning and  Coordination of care: He would benefit from using insulin pens so that he is always using insulin in date. I suspect with a GLP1 he may be able to control his blood sugars with only a basal insulin.  Teaching Method Utilized: Visual,Auditory,Hands on Handouts given during visit include:after visit summary Barriers to learning/adherence to lifestyle change: competing values Demonstrated degree of understanding via:  Teach Back   Monitoring/Evaluation:  Dietary intake, exercise, meter, and body weight in 1 week(s). [name] [name], RD 12/04/2017 11:29 AM.

## 2017-12-04 NOTE — Progress Notes (Addendum)
Patient ID: [name] [name], male   DOB: Jul 21, 1931, 84 y.o.   MRN: [id number]  [facility] wore the CGM for 8 days. The average reading was 132, % time in target was 81, % time below target was 7, and % time above target was 12. Intervention to address hypoglycemia events will be to decrease NPH insulin to 14 units in the morning, decrease lunchtime and dinner time Regular insulin to 4 units before meals, instructions to skip the mealtime dose if he does not take it before the meal. The patient will be scheduled to see [name] for a final appointment.

## 2017-12-04 NOTE — Patient Instructions (Addendum)
Good job on keeping records and wearing the sensor.   You are taking great care of your diabetes! Your blood sugars are very well controlled!  You have 7 more days of wear. The plan to try to decrease your low sugars:   Breakfast- Decrease NPH insulin to 14 units in morning before breakfast  Regular/clear insulin stays at 8 Units before breakfast  Dinner- Inject 4 units regular/clear insulin before lunch- don't take it after eating- just skip it   Supper- inject 4 units regular/clear insulin  [name] [phone]

## 2017-12-10 ENCOUNTER — Encounter: Payer: Self-pay | Admitting: Oncology

## 2017-12-10 NOTE — Progress Notes (Signed)
Patient here for education by DM educator re continuous glucose monitor; regular MD appt to be rescheduled.

## 2017-12-14 ENCOUNTER — Ambulatory Visit (INDEPENDENT_AMBULATORY_CARE_PROVIDER_SITE_OTHER): Payer: Medicare Other | Admitting: Dietician

## 2017-12-14 ENCOUNTER — Other Ambulatory Visit: Payer: Self-pay | Admitting: Dietician

## 2017-12-14 DIAGNOSIS — Z713 Dietary counseling and surveillance: Secondary | ICD-10-CM

## 2017-12-14 DIAGNOSIS — I739 Peripheral vascular disease, unspecified: Secondary | ICD-10-CM

## 2017-12-14 DIAGNOSIS — Z794 Long term (current) use of insulin: Secondary | ICD-10-CM

## 2017-12-14 DIAGNOSIS — Z6834 Body mass index (BMI) 34.0-34.9, adult: Secondary | ICD-10-CM

## 2017-12-14 DIAGNOSIS — R809 Proteinuria, unspecified: Secondary | ICD-10-CM

## 2017-12-14 DIAGNOSIS — E1129 Type 2 diabetes mellitus with other diabetic kidney complication: Secondary | ICD-10-CM | POA: Diagnosis not present

## 2017-12-14 LAB — GLUCOSE, CAPILLARY: Glucose-Capillary: 97 mg/dL (ref 70–99)

## 2017-12-14 MED ORDER — CILOSTAZOL 50 MG PO TABS: 50.0000 mg | ORAL_TABLET | Freq: Two times a day (BID) | ORAL | 3 refills | Status: DC

## 2017-12-14 NOTE — Telephone Encounter (Signed)
[name] [name] asked for a refill of this medicine at today's visit.

## 2017-12-14 NOTE — Progress Notes (Signed)
  Medical Nutrition Therapy:  Appt start time: 1025 end time:  2831. Visit # 3  Assessment:  Primary concerns today: Continuous glucose monitoring/glycemic control Mr. Spivack presents for Continuous glucose monitoring download #2CGM.   Results from Download #2 Average is  132  For 15  days Time in range (70-180 mg/dL): 83% (Goal >70%) Time above range (>180 mg/dL) 12 % Time below range (<70 mg/dL): 5% (Goal is <3%) Coefficient of variation:31.2% (Goal is <36%) Standard variation: 41.2 mg/dL (Goal is <50 mg/dL)  Summary: Overall he has very well controlled diabetes with the exception of hypoglycemic episodes after dinner and one from 12 AM to 3:30 AM.He was  Unaware that he had hypoglycemia. His decreased insulin doses have decreased his time below range and increased his time in range. He reports some hypoglycemia last week may have been caused by taking insulin after eating. He feels timing his insulin with meal better may help improve his control.   MEDICATIONS: Humulin N 14 units QAM.  He take 8 units Novolin R with all 3 meals. He did not decrease his midday and evening meal time doses. [name]  HYPOGLYCEMIA: He denies any signs or symptoms in the past week, but had a headache at today's visit with a CBG if 98 mg/dl.  ANTHROPOMETRICS: Estimated body mass index is 34.05 kg/m as calculated from the following:   Height as of 11/25/17: 6\' 2"  (1.88 m).   Weight as of this encounter: 265 lb 3.2 oz (120.3 kg). His weight was not a concern at this time.   SLEEP:need to assess at future visit DIETARY INTAKE: Usual eating pattern includes 3 meals and ? snacks per day. Everyday foods include  Water, juice, oatmeal, fruit, vegetables, beans, chicken, crystal lite Constipation: need to assess at future visit Dining Out (times/week):2-4   Progress Towards Goal(s):  In progress.   Nutritional Diagnosis:  NB-1.4 Self-monitoring deficit As related to checking blood sugar twice a day  As evidenced by  his report of not wanting to check more frequently despite 4 injections of insulin each day and not knowing what his blood sugars are doing between checks and  Overnight improved  And resolved by using CGM.    Intervention:  Nutrition education about Continuous glucose monitoring, timing and insulin dose amounts,  Coordination of care: He would benefit from using insulin pens so that he is always using insulin in date. I suspect with a GLP1 he may be able to control his blood sugars with only a basal insulin.  Teaching Method Utilized: Visual,Auditory,Hands on Handouts given during visit include:after visit summary Barriers to learning/adherence to lifestyle change: competing values Demonstrated degree of understanding via:  Teach Back   Monitoring/Evaluation:  Dietary intake, exercise, meter, and body weight in 3 month(s). [name], RD 12/14/2017 11:16 AM.

## 2017-12-14 NOTE — Patient Instructions (Addendum)
Your diabetes is well controlled!  Stop at the front desk to make your doctor's appointment.  Change insulin doses as follows :   [name] [name] insulin doses as of December 14, 2017  Breakfast- inject 14 units of NPH (Cloudy) insulin                               8 units Regular (clear) insulin  Dinner- inject 4 units Regular (clear) insulin BEFORE eating or SKIP it  Supper- inject 4 units of Regular insulin (clear) BEFORE eating or skip it.  Keep up the great work!!!  [name] [phone]

## 2017-12-15 ENCOUNTER — Ambulatory Visit: Payer: Medicare Other | Admitting: Orthotics

## 2017-12-15 DIAGNOSIS — E1159 Type 2 diabetes mellitus with other circulatory complications: Secondary | ICD-10-CM | POA: Diagnosis not present

## 2017-12-15 DIAGNOSIS — L97519 Non-pressure chronic ulcer of other part of right foot with unspecified severity: Secondary | ICD-10-CM | POA: Diagnosis not present

## 2017-12-15 DIAGNOSIS — M2041 Other hammer toe(s) (acquired), right foot: Secondary | ICD-10-CM | POA: Diagnosis not present

## 2017-12-15 DIAGNOSIS — M2042 Other hammer toe(s) (acquired), left foot: Secondary | ICD-10-CM | POA: Diagnosis not present

## 2017-12-16 NOTE — Telephone Encounter (Signed)
[name] [name] calls and has not been able to get his Pletal filled. Call to pharmacy- it has been ready for several days. Patient notified.

## 2018-01-05 ENCOUNTER — Other Ambulatory Visit: Payer: Self-pay

## 2018-01-05 MED ORDER — GLUCOSE BLOOD VI STRP: ORAL_STRIP | 2 refills | Status: DC

## 2018-01-13 NOTE — Addendum Note (Signed)
Addended by: Hulan Fray on: 01/13/2018 06:24 PM   Modules accepted: Orders

## 2018-01-14 ENCOUNTER — Telehealth: Payer: Self-pay | Admitting: Endocrinology

## 2018-01-14 NOTE — Telephone Encounter (Signed)
He needs to call his PCP or urologist.  I only treated him once because he was in the office at that time

## 2018-01-14 NOTE — Telephone Encounter (Signed)
Patient informed. 

## 2018-01-14 NOTE — Telephone Encounter (Signed)
Do we need to be more specific with symptoms-please advise

## 2018-01-14 NOTE — Telephone Encounter (Signed)
Patient stated he is having burning when he urinates. Patient stated that he usually calls Dr Dwyane Dee and he will prescribed him something for this. Please advise

## 2018-01-20 ENCOUNTER — Other Ambulatory Visit: Payer: Self-pay | Admitting: Endocrinology

## 2018-02-24 ENCOUNTER — Telehealth: Payer: Self-pay | Admitting: Cardiovascular Disease

## 2018-02-24 MED ORDER — METOPROLOL TARTRATE 25 MG PO TABS: 25.0000 mg | ORAL_TABLET | Freq: Two times a day (BID) | ORAL | 2 refills | Status: DC

## 2018-02-24 MED ORDER — AMLODIPINE BESYLATE 10 MG PO TABS: 10.0000 mg | ORAL_TABLET | Freq: Every day | ORAL | 2 refills | Status: DC

## 2018-02-24 NOTE — Telephone Encounter (Signed)
New message      *STAT* If patient is at the pharmacy, call can be transferred to refill team.   1. Which medications need to be refilled? (please list name of each medication and dose if known) metoprolol tartrate (LOPRESSOR) 25 MG tablet, amLODipine (NORVASC) 10 MG tablet  2. Which pharmacy/location (including street and city if local pharmacy) is medication to be sent to?[facility], [address]  3. Do they need a 30 day or 90 day supply? [place]

## 2018-02-24 NOTE — Telephone Encounter (Signed)
Pt's medications were sent to pt's pharmacy as requested. Confirmation received.  

## 2018-02-26 ENCOUNTER — Encounter (INDEPENDENT_AMBULATORY_CARE_PROVIDER_SITE_OTHER): Payer: Self-pay

## 2018-02-26 ENCOUNTER — Ambulatory Visit: Payer: Medicare Other | Admitting: Internal Medicine

## 2018-02-26 ENCOUNTER — Encounter: Payer: Self-pay | Admitting: Internal Medicine

## 2018-02-26 VITALS — BP 129/66 | HR 68 | Temp 98.4°F | Wt 260.4 lb

## 2018-02-26 DIAGNOSIS — E1142 Type 2 diabetes mellitus with diabetic polyneuropathy: Secondary | ICD-10-CM | POA: Diagnosis not present

## 2018-02-26 DIAGNOSIS — E66811 Obesity, class 1: Secondary | ICD-10-CM

## 2018-02-26 DIAGNOSIS — E1151 Type 2 diabetes mellitus with diabetic peripheral angiopathy without gangrene: Secondary | ICD-10-CM

## 2018-02-26 DIAGNOSIS — N401 Enlarged prostate with lower urinary tract symptoms: Secondary | ICD-10-CM

## 2018-02-26 DIAGNOSIS — Z8546 Personal history of malignant neoplasm of prostate: Secondary | ICD-10-CM

## 2018-02-26 DIAGNOSIS — R300 Dysuria: Secondary | ICD-10-CM

## 2018-02-26 DIAGNOSIS — Z6833 Body mass index (BMI) 33.0-33.9, adult: Secondary | ICD-10-CM

## 2018-02-26 DIAGNOSIS — I2510 Atherosclerotic heart disease of native coronary artery without angina pectoris: Secondary | ICD-10-CM

## 2018-02-26 DIAGNOSIS — I5022 Chronic systolic (congestive) heart failure: Secondary | ICD-10-CM

## 2018-02-26 DIAGNOSIS — K219 Gastro-esophageal reflux disease without esophagitis: Secondary | ICD-10-CM

## 2018-02-26 DIAGNOSIS — R3915 Urgency of urination: Secondary | ICD-10-CM

## 2018-02-26 DIAGNOSIS — N3090 Cystitis, unspecified without hematuria: Secondary | ICD-10-CM

## 2018-02-26 DIAGNOSIS — M25512 Pain in left shoulder: Secondary | ICD-10-CM

## 2018-02-26 DIAGNOSIS — N3000 Acute cystitis without hematuria: Secondary | ICD-10-CM

## 2018-02-26 DIAGNOSIS — J310 Chronic rhinitis: Secondary | ICD-10-CM

## 2018-02-26 DIAGNOSIS — E7800 Pure hypercholesterolemia, unspecified: Secondary | ICD-10-CM

## 2018-02-26 DIAGNOSIS — J3089 Other allergic rhinitis: Secondary | ICD-10-CM

## 2018-02-26 DIAGNOSIS — N390 Urinary tract infection, site not specified: Secondary | ICD-10-CM | POA: Insufficient documentation

## 2018-02-26 DIAGNOSIS — G8929 Other chronic pain: Secondary | ICD-10-CM

## 2018-02-26 DIAGNOSIS — Z7982 Long term (current) use of aspirin: Secondary | ICD-10-CM

## 2018-02-26 DIAGNOSIS — Z794 Long term (current) use of insulin: Secondary | ICD-10-CM

## 2018-02-26 DIAGNOSIS — K449 Diaphragmatic hernia without obstruction or gangrene: Secondary | ICD-10-CM

## 2018-02-26 DIAGNOSIS — I10 Essential (primary) hypertension: Secondary | ICD-10-CM

## 2018-02-26 DIAGNOSIS — E669 Obesity, unspecified: Secondary | ICD-10-CM

## 2018-02-26 DIAGNOSIS — E785 Hyperlipidemia, unspecified: Secondary | ICD-10-CM

## 2018-02-26 DIAGNOSIS — I110 Hypertensive heart disease with heart failure: Secondary | ICD-10-CM

## 2018-02-26 DIAGNOSIS — I739 Peripheral vascular disease, unspecified: Secondary | ICD-10-CM

## 2018-02-26 DIAGNOSIS — N3941 Urge incontinence: Secondary | ICD-10-CM

## 2018-02-26 DIAGNOSIS — Z79899 Other long term (current) drug therapy: Secondary | ICD-10-CM

## 2018-02-26 LAB — GLUCOSE, CAPILLARY: GLUCOSE-CAPILLARY: 289 mg/dL — AB (ref 70–99)

## 2018-02-26 LAB — POCT GLYCOSYLATED HEMOGLOBIN (HGB A1C): Hemoglobin A1C: 9.2 % — AB (ref 4.0–5.6)

## 2018-02-26 MED ORDER — LORATADINE 10 MG PO TABS: 10.0000 mg | ORAL_TABLET | Freq: Every day | ORAL | 3 refills | Status: DC | PRN

## 2018-02-26 MED ORDER — OXYBUTYNIN CHLORIDE 5 MG PO TABS: 5.0000 mg | ORAL_TABLET | Freq: Three times a day (TID) | ORAL | 3 refills | Status: DC

## 2018-02-26 NOTE — Assessment & Plan Note (Signed)
Assessment  He denies any worsening dyspnea on exertion, orthopnea, or paroxysmal nocturnal dyspnea.  His chronic heart failure regimen includes metoprolol 25 mg by mouth twice daily and losartan 50 mg by mouth daily.  Plan  We will continue the metoprolol and losartan at the current doses and reassess for evidence of decompensation of his chronic systolic heart failure at the follow-up visit.

## 2018-02-26 NOTE — Assessment & Plan Note (Signed)
Other than the ophthalmologic examination is healthcare maintenance is up-to-date.

## 2018-02-26 NOTE — Assessment & Plan Note (Signed)
Assessment  His diabetic control has deteriorated over the last 4 months with his hemoglobin A1c increasing from 7.3 to 9.2.  He was not taking his insulin as prescribed but did self titrate upwards.  His regimen coming into the clinic was insulin N 15 units every morning and 15 units every afternoon and insulin R 15 units every morning and 15 units every afternoon.  He is also taken metformin XR 1000 mg by mouth daily.  He did not follow-up with ophthalmology.  With regards to his peripheral neuropathy he continues to take the gabapentin 100 mg by mouth 3 times daily with relief of his peripheral neuropathy.  He also states that he has been careful with his diet but admits to being unable to be as active as he would like given his claudication.  Plan  He will continue the Metformin XR 1000 mg by mouth daily and we will increase the insulin N to 20 units twice daily before breakfast and dinner and keep the insulin R at 15 units twice daily before breakfast and dinner.  He will continue to monitor his blood sugars and return to the clinic in 1 month in order to reassess his glycemic control with the change in the insulin regimen.  Depending upon the results we will make further adjustments in his insulin dosing at that appointment with a target hemoglobin A1c near 7.0.  He will continue the gabapentin at 100 mg by mouth 3 times daily.  Finally, another referral to the eye physician was placed today for his diabetic retinal examination.

## 2018-02-26 NOTE — Assessment & Plan Note (Signed)
Assessment  His blood pressure today was well controlled at 129/66.  This is on amlodipine 10 mg by mouth daily, hydrochlorothiazide 25 mg by mouth daily, losartan 50 mg by mouth daily, and metoprolol 25 mg by mouth twice daily.  He is tolerating this regimen well.  Plan  We will continue the amlodipine at 10 mg by mouth daily, hydrochlorothiazide at 25 mg by mouth daily, losartan 50 mg by mouth daily, and metoprolol 25 mg by mouth twice daily.  A basic metabolic panel was obtained today to reassess his potassium level since the potassium chloride was stopped at the last visit.  The result is pending at the time of this dictation.  We will reassess his blood pressure at the return visit on this regimen to assure he remains at target.  Should he require titration of his regimen up the first move would likely be increasing losartan to 100 mg by mouth daily.

## 2018-02-26 NOTE — Progress Notes (Signed)
   Subjective:    Patient ID: [name], male    DOB: 02-Feb-1932, 85 y.o.   MRN: [id number]  HPI  Crecencio Kwiatek is here for follow-up of his type 2 diabetes complicated by a peripheral neuropathy, essential hypertension, hyperplipidemia, coronary artery disease without angina, peripheral vascular occlusive disease, chronic systolic heart failure, gastroesophageal reflux disease, chronic non-seasonal allergic rhinitis, benign prostatic hyperplasia with urinary urge incontinence, obesity, and history of prostate cancer. Please see the A&P for the status of the pt's chronic medical problems.  He is w/o acute complaints today.  Review of Systems  Constitutional: Negative for activity change, appetite change and unexpected weight change.  HENT: Negative for congestion, rhinorrhea and sinus pressure.   Respiratory: Negative for chest tightness, shortness of breath and wheezing.   Cardiovascular: Negative for chest pain, palpitations and leg swelling.  Gastrointestinal: Negative for abdominal distention, abdominal pain, blood in stool, constipation, diarrhea, nausea and vomiting.  Genitourinary: Positive for dysuria, frequency and urgency. Negative for hematuria.  Musculoskeletal: Positive for arthralgias. Negative for joint swelling and myalgias.  Skin: Negative for rash.      Objective:   Physical Exam Vitals signs and nursing note reviewed.  Constitutional:      General: He is not in acute distress.    Appearance: Normal appearance. He is obese. He is not ill-appearing, toxic-appearing or diaphoretic.  HENT:     Head: Normocephalic and atraumatic.  Cardiovascular:     Rate and Rhythm: Normal rate and regular rhythm.     Heart sounds: Normal heart sounds. No murmur. No friction rub. No gallop.   Pulmonary:     Effort: Pulmonary effort is normal. No respiratory distress.     Breath sounds: Normal breath sounds. No stridor. No wheezing, rhonchi or rales.  Abdominal:   General: Bowel sounds are normal. There is no distension.     Palpations: Abdomen is soft.     Tenderness: There is no abdominal tenderness. There is no guarding or rebound.  Musculoskeletal: Normal range of motion.        General: No swelling or signs of injury.     Right lower leg: No edema.     Left lower leg: No edema.  Skin:    General: Skin is warm and dry.     Findings: No erythema, lesion or rash.  Neurological:     Mental Status: He is alert and oriented to person, place, and time. Mental status is at baseline.     Coordination: Coordination normal.  Psychiatric:        Mood and Affect: Mood normal.        Behavior: Behavior normal.        Thought Content: Thought content normal.        Judgment: Judgment normal.       Assessment & Plan:   Please see problem oriented charting.

## 2018-02-26 NOTE — Assessment & Plan Note (Signed)
Assessment  His chronic left shoulder pain remains intermittent.  It is not bothering him to the point that he is interested in any intervention and currently does not hurt him at all today.  Plan  We will continue to follow his chronic left shoulder pain clinically and reassess if he is interested in any intervention for this issue at the follow-up visit.

## 2018-02-26 NOTE — Assessment & Plan Note (Signed)
Assessment  His benign prostatic hyperplasia with urge incontinence is well controlled on the tamsulosin 0.4 mg by mouth daily and oxybutynin 5 mg by mouth 3 times daily.  He does admit to dry mouth but says this is tolerable and much preferred to his urge incontinence for which the oxybutynin is working well.  Plan  We will continue the tamsulosin 0.4 mg by mouth daily for his benign prostatic hyperplasia and the oxybutynin 5 mg by mouth 3 times daily for his urge incontinence.  We will reassess the efficacy of these medications in controlling his symptoms at the follow-up visit.

## 2018-02-26 NOTE — Assessment & Plan Note (Signed)
Assessment  He is tolerating the atorvastatin 40 mg by mouth daily without myalgias.  Plan  We will continue with this high intensity statin and reassess his tolerance at the follow-up visit.

## 2018-02-26 NOTE — Patient Instructions (Signed)
It was good to see you today.  We are going to have to make some adjustments in your insulin to get your sugars better.  1) Increase the insulin N (cloudy to 20 units twice a day.  2) Keep the insulin R (clear) to 15 units twice a day.  3) Keep checking your blood sugars.  4) Keep taking all of your other medications like you are.  5) I checked your PSA, potassium, and urine today.  6) I sent a referral to the eye doctor.  I will see you back in 1 month to adjust your insulin some more.

## 2018-02-26 NOTE — Assessment & Plan Note (Signed)
Assessment  He is complaining of dysuria with some frequency and is concerned about a urinary tract infection.  Plan  We are obtaining a urinalysis to assess for evidence of a urinary tract infection which would be symptomatic and require antibiotics.  The result is pending at the time of this dictation.

## 2018-02-26 NOTE — Assessment & Plan Note (Signed)
Assessment  He has been watching his diet and his weight has decreased 5 pounds in the last 4 months.  Plan  He was praised on his success with his lifestyle modification and weight loss.  He was encouraged to continue with these lifestyle modifications in hopes of further weight loss and improvement in his glycemic control.

## 2018-02-26 NOTE — Assessment & Plan Note (Signed)
Assessment  He continues to have claudication with ambulation although he states this is unchanged over the last several months.  He is tolerating the cilostazol 50 mg by mouth twice daily.  We are also aggressively addressing the cardiovascular risk factors including the hypertension, hyperlipidemia, and diabetes.  Plan  We will continue with the cilostazol 50 mg by mouth twice daily and aggressive management of the cardiovascular risk factors.  We will reassess for progression of his claudication or rest pain at the follow-up visit.

## 2018-02-26 NOTE — Assessment & Plan Note (Signed)
Assessment  He states the loratadine 10 mg by mouth daily as needed for his allergic rhinitis is effective and is asking for a renewal of his prescription.  Plan  The loratadine 10 mg by mouth daily as needed for his allergic rhinitis was renewed and electronically sent to the pharmacy.  We will reassess the efficacy of this therapy in managing his allergic rhinitis symptoms at the follow-up visit.

## 2018-02-26 NOTE — Assessment & Plan Note (Signed)
Assessment  He denies any chest pain since the last visit.  His current antianginal regimen includes aspirin 81 mg by mouth daily, atorvastatin 40 mg by mouth daily, metoprolol 25 mg by mouth twice daily, and amlodipine 10 mg by mouth daily.  Plan  We will continue the aspirin at 81 mg by mouth daily, atorvastatin 40 mg by mouth daily, metoprolol 25 mg by mouth twice daily, and amlodipine at 10 mg by mouth daily.  We will reassess for evidence of angina on this regimen at the follow-up visit.

## 2018-02-26 NOTE — Assessment & Plan Note (Signed)
Assessment and Plan  A PSA was obtained today to assess the status of his prior prostate cancer.  The result is pending at the time of this dictation.

## 2018-02-26 NOTE — Assessment & Plan Note (Signed)
Assessment  He states that his gastroesophageal reflux disease is well controlled on the omeprazole 20 mg by mouth daily.  Plan  We will continue the omeprazole at 20 mg by mouth daily and reassess the efficacy of this PPI therapy in managing his reflux symptoms at the follow-up visit.

## 2018-02-27 LAB — PSA: Prostate Specific Ag, Serum: <0.1 ng/mL (ref 0.0–4.0)

## 2018-02-27 LAB — MICROSCOPIC EXAMINATION
Casts: NONE SEEN /lpf
RBC, UA: NONE SEEN /hpf (ref 0–2)
WBC, UA: >30 /hpf — AB (ref 0–5)

## 2018-02-27 LAB — BMP8+ANION GAP
ANION GAP: 17 mmol/L (ref 10.0–18.0)
BUN/Creatinine Ratio: 16 (ref 10–24)
BUN: 25 mg/dL (ref 8–27)
CALCIUM: 9.3 mg/dL (ref 8.6–10.2)
CO2: 21 mmol/L (ref 20–29)
CREATININE: 1.54 mg/dL — AB (ref 0.76–1.27)
Chloride: 97 mmol/L (ref 96–106)
GFR calc Af Amer: 47 mL/min/{1.73_m2} — ABNORMAL LOW (ref >59)
GFR, EST NON AFRICAN AMERICAN: 41 mL/min/{1.73_m2} — AB (ref >59)
Glucose: 310 mg/dL — ABNORMAL HIGH (ref 65–99)
Potassium: 4.1 mmol/L (ref 3.5–5.2)
Sodium: 135 mmol/L (ref 134–144)

## 2018-02-27 LAB — URINALYSIS, ROUTINE W REFLEX MICROSCOPIC
Bilirubin, UA: NEGATIVE
Ketones, UA: NEGATIVE
Nitrite, UA: NEGATIVE
RBC, UA: NEGATIVE
Specific Gravity, UA: 1.02 (ref 1.005–1.030)
Urobilinogen, Ur: 1 mg/dL (ref 0.2–1.0)
pH, UA: >=9 — AB (ref 5.0–7.5)

## 2018-03-01 MED ORDER — SULFAMETHOXAZOLE-TRIMETHOPRIM 800-160 MG PO TABS: 1.0000 | ORAL_TABLET | Freq: Two times a day (BID) | ORAL | 0 refills | Status: DC

## 2018-03-01 NOTE — Addendum Note (Signed)
Addended by: Oval Linsey D on: 03/01/2018 09:23 AM   Modules accepted: Orders

## 2018-03-01 NOTE — Progress Notes (Signed)
Patient ID: [name] [name], male   DOB: 01/30/1932, 85 y.o.   MRN: [id number]  PSA < 0.1  PSA remains undetectable.  BMP: Glucose 310, BUN 25, Cr 1.54, eGFR 47, K 4.1  Slight reduction in eGFR over the past year.  Working on risk factor modification, specifically the glycemic control.  Potassium level is normal despite being off supplementation for several months.  Therefore he no longer requires potassium supplementation.  UA: Specific gravity 1.020, pH > 9.0, turbid yellow, 3+ leukocytes, 2+ protein, trace glucose, nitrite negative, RBC 0/HPF, WBC >30/HPF, many bacteria  UA is consistent with a UTI and is symptomatic given his dysuria.  He was called with the result and is interested in a course of Bactrim DS 1 tablet PO BID X 7 days for a complicated symptomatic UTI.  A prescription was sent to his pharmacy.

## 2018-03-11 ENCOUNTER — Other Ambulatory Visit: Payer: Self-pay | Admitting: Endocrinology

## 2018-03-11 ENCOUNTER — Other Ambulatory Visit: Payer: Medicare Other

## 2018-03-15 ENCOUNTER — Ambulatory Visit: Payer: Medicare Other | Admitting: Endocrinology

## 2018-03-16 ENCOUNTER — Ambulatory Visit: Payer: Medicare Other | Admitting: Dietician

## 2018-03-17 LAB — HM DIABETES EYE EXAM

## 2018-03-24 ENCOUNTER — Encounter: Payer: Self-pay | Admitting: Internal Medicine

## 2018-03-24 ENCOUNTER — Encounter: Payer: Self-pay | Admitting: *Deleted

## 2018-03-24 DIAGNOSIS — E113293 Type 2 diabetes mellitus with mild 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, bilateral: Secondary | ICD-10-CM

## 2018-03-24 HISTORY — DX: Type 2 diabetes mellitus with mild 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, bilateral: E11.3293

## 2018-03-26 ENCOUNTER — Ambulatory Visit: Payer: Medicare Other | Admitting: Internal Medicine

## 2018-03-26 ENCOUNTER — Encounter: Payer: Self-pay | Admitting: Internal Medicine

## 2018-03-26 DIAGNOSIS — I10 Essential (primary) hypertension: Secondary | ICD-10-CM

## 2018-03-26 DIAGNOSIS — I5022 Chronic systolic (congestive) heart failure: Secondary | ICD-10-CM

## 2018-03-26 DIAGNOSIS — Z7982 Long term (current) use of aspirin: Secondary | ICD-10-CM

## 2018-03-26 DIAGNOSIS — E7800 Pure hypercholesterolemia, unspecified: Secondary | ICD-10-CM

## 2018-03-26 DIAGNOSIS — E1142 Type 2 diabetes mellitus with diabetic polyneuropathy: Secondary | ICD-10-CM

## 2018-03-26 DIAGNOSIS — Z794 Long term (current) use of insulin: Secondary | ICD-10-CM

## 2018-03-26 DIAGNOSIS — K5900 Constipation, unspecified: Secondary | ICD-10-CM | POA: Diagnosis not present

## 2018-03-26 DIAGNOSIS — E785 Hyperlipidemia, unspecified: Secondary | ICD-10-CM

## 2018-03-26 DIAGNOSIS — Z87440 Personal history of urinary (tract) infections: Secondary | ICD-10-CM

## 2018-03-26 DIAGNOSIS — I739 Peripheral vascular disease, unspecified: Secondary | ICD-10-CM

## 2018-03-26 DIAGNOSIS — I110 Hypertensive heart disease with heart failure: Secondary | ICD-10-CM

## 2018-03-26 DIAGNOSIS — Z79899 Other long term (current) drug therapy: Secondary | ICD-10-CM

## 2018-03-26 NOTE — Assessment & Plan Note (Signed)
Assessment  [name] [name] increased his NPH insulin to 20 units twice daily at the last visit in late January.  His meter was downloaded and there was a marked improvement in the evening highs with this increase.  His average blood sugar was now 169 with 37% of the readings at target and this includes those high readings in late January before the change was made.  He only had 3 readings that were below target and he is able to sense the hypoglycemia and quickly can address it with a small glass of orange juice.  He realizes that the variation in his blood sugars may be related to an inconsistency with the amount of food that he eats.  He also admits that in the winter months he is not as active as he is in the warmer months.  He is quite happy with the improvement in his glycemic control after the recent change.  With regards to his peripheral neuropathy he experiences very few symptoms while on the gabapentin.  Plan  We will continue the insulin N at 20 units twice daily and the insulin R at 15 units before meals twice daily.  He knows to adjust if he is not going to eat as much and has been doing this successfully.  It is too early to check a hemoglobin A1c to assess progress, but I do not believe this is necessary anyways given the marked improvement in the readings on his glucose meter.  We will check a hemoglobin A1c at the follow-up visit.  [name] [name] was praised for the marked improvement in his glycemic control and was encouraged to become more active as the weather begins to warm up.  We will also continue the gabapentin at 100 mg by mouth 3 times daily for his peripheral neuropathy.

## 2018-03-26 NOTE — Assessment & Plan Note (Signed)
Assessment  He notes occasional constipation associated with very hard stools.  He denies any other change in his bowel habits, change in stool caliber, or blood in the stool.  I suspect that the constipation is related to his anticholinergic medication, specifically the oxybutynin.  Plan  He was advised to obtain docusate 1 tablet every 8 hours as needed for hard stools and constipation.  He can obtain this medication over-the-counter.  We will reassess the efficacy of this intervention at the follow-up visit.

## 2018-03-26 NOTE — Assessment & Plan Note (Signed)
Assessment  He denies any claudication on the aspirin and cilostazol.  He admits that this is partially due to the fact that he is not as active during the winter months.  He denies any evidence of foot ulceration.  Plan  We will continue the cilostazol 50 mg by mouth twice daily and aspirin at 81 mg by mouth daily.  In addition, we will continue aggressive risk factor modification, specifically control of his hypertension, hyperlipidemia, and diabetes.  We will reassess for progression of his claudication symptoms at the follow-up visit, especially given the improvement in the weather and the likely increase in his activity.

## 2018-03-26 NOTE — Assessment & Plan Note (Signed)
He is currently up-to-date on his health care maintenance. 

## 2018-03-26 NOTE — Assessment & Plan Note (Signed)
Assessment  He denies any signs or symptoms consistent with a decompensation in his chronic systolic heart failure, specifically no lower extremity swelling, orthopnea, or paroxysmal nocturnal dyspnea.  He denies any increase in dyspnea with exertion as well.  Plan  We will continue the metoprolol at 25 mg by mouth twice daily and losartan 50 mg by mouth daily for his chronic systolic heart failure.  We will reassess for symptoms suggesting decompensation at the follow-up visit.

## 2018-03-26 NOTE — Progress Notes (Signed)
   Subjective:    Patient ID: [name] [name], male    DOB: [birth year], 85 y.o.   MRN: [id number]  HPI  [name] [name] is here for follow-up of his type 2 diabetes with peripheral neuropathy, essential hypertension, peripheral arterial disease, chronic systolic heart failure, hyperlipidemia, and urinary tract infection. Please see the A&P for the status of the pt's chronic medical problems.  Review of Systems  Constitutional: Negative for activity change, appetite change and unexpected weight change.  HENT: Negative for rhinorrhea, sinus pressure and sinus pain.   Respiratory: Negative for chest tightness, shortness of breath and wheezing.   Cardiovascular: Negative for chest pain, palpitations and leg swelling.  Gastrointestinal: Positive for constipation. Negative for abdominal pain, diarrhea and vomiting.  Genitourinary: Negative for difficulty urinating, dysuria and flank pain.  Musculoskeletal: Negative for arthralgias, back pain, gait problem, joint swelling and myalgias.  Psychiatric/Behavioral: Negative for sleep disturbance.      Objective:   Physical Exam Vitals signs and nursing note reviewed.  Constitutional:      General: He is not in acute distress.    Appearance: Normal appearance. He is obese. He is not ill-appearing, toxic-appearing or diaphoretic.  HENT:     Head: Normocephalic and atraumatic.  Eyes:     General: No scleral icterus.       Right eye: No discharge.        Left eye: No discharge.  Cardiovascular:     Rate and Rhythm: Normal rate and regular rhythm.     Heart sounds: Normal heart sounds. No murmur. No friction rub. No gallop.   Pulmonary:     Effort: Pulmonary effort is normal.  Chest:     Chest wall: No tenderness.  Abdominal:     General: There is no distension.     Palpations: Abdomen is soft.     Tenderness: There is no abdominal tenderness. There is no guarding or rebound.  Musculoskeletal: Normal range of motion.        General: No  swelling or tenderness.     Right lower leg: No edema.     Left lower leg: No edema.  Skin:    General: Skin is warm and dry.  Neurological:     Mental Status: He is alert and oriented to person, place, and time. Mental status is at baseline.  Psychiatric:        Mood and Affect: Mood normal.        Behavior: Behavior normal.        Thought Content: Thought content normal.        Judgment: Judgment normal.       Assessment & Plan:   Please see problem oriented charting.

## 2018-03-26 NOTE — Assessment & Plan Note (Signed)
Assessment  His blood pressure today was at target at 130/62.  This is on amlodipine 10 mg by mouth daily, hydrochlorothiazide 25 mg by mouth daily, losartan 50 mg by mouth daily, and metoprolol 25 mg by mouth twice daily.  He is tolerating this regimen well.  Plan  We will continue the amlodipine at 10 mg by mouth daily, hydrochlorothiazide at 25 mg by mouth daily, losartan 50 mg by mouth daily, and metoprolol at 25 mg by mouth twice daily.  We will reassess the blood pressure at the follow-up visit.

## 2018-03-26 NOTE — Patient Instructions (Signed)
It was good to see you today.  You have done an amazing job with your sugars!  1) Keep taking the medications as you are.  2) Consider trying docusate 1 tablet up to every 8 hours as needed to soften your stool.  You can get this at any pharmacy  I will see you in 3 months, sooner if necessary.

## 2018-03-26 NOTE — Assessment & Plan Note (Signed)
Assessment  The dysuria associated with his urinary tract infection seen at the last visit has resolved with the Bactrim.  Plan  No further evaluation is necessary as the symptomatic urinary tract infection has resolved.

## 2018-03-26 NOTE — Assessment & Plan Note (Signed)
Assessment  He is tolerating the atorvastatin 40 mg by mouth daily without myalgias.  Plan  We will continue with his high intensity statin and reassess for intolerances at the follow-up visit.

## 2018-04-01 ENCOUNTER — Other Ambulatory Visit: Payer: Self-pay | Admitting: Internal Medicine

## 2018-04-01 DIAGNOSIS — N3941 Urge incontinence: Secondary | ICD-10-CM

## 2018-04-01 DIAGNOSIS — N401 Enlarged prostate with lower urinary tract symptoms: Principal | ICD-10-CM

## 2018-04-01 MED ORDER — TAMSULOSIN HCL 0.4 MG PO CAPS: 0.4000 mg | ORAL_CAPSULE | Freq: Every day | ORAL | 3 refills | Status: DC

## 2018-04-01 NOTE — Telephone Encounter (Signed)
Needs refill on tamsulosin (FLOMAX) 0.4 MG CAPS capsule [address] [place], [place] [address];pt contact [phone]

## 2018-04-08 ENCOUNTER — Other Ambulatory Visit: Payer: Medicare Other

## 2018-04-12 ENCOUNTER — Ambulatory Visit: Payer: Medicare Other | Admitting: Endocrinology

## 2018-04-16 ENCOUNTER — Telehealth: Payer: Self-pay | Admitting: Endocrinology

## 2018-04-16 NOTE — Telephone Encounter (Signed)
Patient no showed today's appt. Please advise on how to follow up. °A. No follow up necessary. °B. Follow up urgent. Contact patient immediately. °C. Follow up necessary. Contact patient and schedule visit in ___ days. °D. Follow up advised. Contact patient and schedule visit in ____weeks. ° °Would you like the NS fee to be applied to this visit? ° °

## 2018-04-18 NOTE — Telephone Encounter (Signed)
C. Follow up necessary. Contact patient and schedule visit in 15-30 days

## 2018-05-03 ENCOUNTER — Encounter: Payer: Self-pay | Admitting: *Deleted

## 2018-05-10 ENCOUNTER — Other Ambulatory Visit: Payer: Self-pay | Admitting: Endocrinology

## 2018-05-11 ENCOUNTER — Telehealth: Payer: Self-pay

## 2018-05-11 NOTE — Telephone Encounter (Signed)
This was filled by dr Dwyane Dee, 4/6

## 2018-05-11 NOTE — Telephone Encounter (Signed)
metFORMIN (GLUCOPHAGE-XR) 500 MG 24 hr tablet, refill request [address] [place], [place] [phone] (Phone) [phone] (Fax)

## 2018-05-12 ENCOUNTER — Telehealth: Payer: Self-pay | Admitting: Physician Assistant

## 2018-05-12 ENCOUNTER — Other Ambulatory Visit: Payer: Self-pay | Admitting: Endocrinology

## 2018-05-12 NOTE — Telephone Encounter (Signed)
Called patient to convert office visit 4/21 to virtual visit due to COVID-19. However to hard to do conversation over phone. Will send to scheduler to reach out to patient and see if he is interested in virtual visit with Dr. Acie Fredrickson or APP.

## 2018-05-20 ENCOUNTER — Telehealth: Payer: Self-pay | Admitting: Physician Assistant

## 2018-05-20 NOTE — Telephone Encounter (Signed)
Spoke with patient who confirmed all demographics. He does not

## 2018-05-20 NOTE — Telephone Encounter (Signed)
Virtual Visit Pre-Appointment Phone Call  Steps For Call:  1. Confirm consent - "In the setting of the current Covid19 crisis, you are scheduled for a (phone or video) visit with your provider on (date) at (time).  Just as we do with many in-office visits, in order for you to participate in this visit, we must obtain consent.  If you'd like, I can send this to your mychart (if signed up) or email for you to review.  Otherwise, I can obtain your verbal consent now.  All virtual visits are billed to your insurance company just like a normal visit would be.  By agreeing to a virtual visit, we'd like you to understand that the technology does not allow for your provider to perform an examination, and thus may limit your provider's ability to fully assess your condition.  Finally, though the technology is pretty good, we cannot assure that it will always work on either your or our end, and in the setting of a video visit, we may have to convert it to a phone-only visit.  In either situation, we cannot ensure that we have a secure connection.  Are you willing to proceed?" STAFF: Did the patient verbally acknowledge consent to telehealth visit? Document YES/NO here: YES  2. Confirm the BEST phone number to call the day of the visit by including in appointment notes  3. Give patient instructions for WebEx/MyChart download to smartphone as below or Doximity/Doxy.me if video visit (depending on what platform provider is using)  4. Advise patient to be prepared with their blood pressure, heart rate, weight, any heart rhythm information, their current medicines, and a piece of paper and pen handy for any instructions they may receive the day of their visit  5. Inform patient they will receive a phone call 15 minutes prior to their appointment time (may be from unknown caller ID) so they should be prepared to answer  6. Confirm that appointment type is correct in Epic appointment notes (VIDEO vs PHONE)      TELEPHONE CALL NOTE  [name] [name] has been deemed a candidate for a follow-up tele-health visit to limit community exposure during the Covid-19 pandemic. I spoke with the patient via phone to ensure availability of phone/video source, confirm preferred email & phone number, and discuss instructions and expectations.  I reminded [name] [name] to be prepared with any vital sign and/or heart rhythm information that could potentially be obtained via home monitoring, at the time of his visit. I reminded [name] [name] to expect a phone call at the time of his visit if his visit.  [name] [name], [place] 05/20/2018 4:39 PM   INSTRUCTIONS FOR DOWNLOADING THE [place] APP TO SMARTPHONE  - If Apple, ask patient to go to [employer] and type in WebEx in the search bar. [employer], the blue/green circle. If Android, go to [employer] and type in [employer] in the search bar. The app is free but as with any other app downloads, their phone may require them to verify saved payment information or Apple/Android password.  - The patient does NOT have to create an account. - On the day of the visit, the assist will walk the patient through joining the meeting with the meeting number/password.  INSTRUCTIONS FOR DOWNLOADING THE MYCHART APP TO SMARTPHONE  - The patient must first make sure to have activated MyChart and know their login information - If Apple, go to [employer] and type in MyChart in the  search bar and download the app. If Android, ask patient to go to [employer] and type in [place] in the search bar and download the app. The app is free but as with any other app downloads, their phone may require them to verify saved payment information or Apple/Android password.  - The patient will need to then log into the app with their MyChart username and password, and select [facility] as their healthcare provider to link the account. When it is time for your visit, go to the  MyChart app, find appointments, and click Begin Video Visit. Be sure to Select Allow for your device to access the Microphone and Camera for your visit. You will then be connected, and your provider will be with you shortly.  **If they have any issues connecting, or need assistance please contact MyChart service desk [phone])**  **If using a computer, in order to ensure the best quality for their visit they will need to use either of the following Internet Browsers: [employer], or Google Chrome**  IF USING DOXIMITY or DOXY.ME - The patient will receive a link just prior to their visit, either by text or email (to be determined day of appointment depending on if it's doxy.me or Doximity).     FULL LENGTH CONSENT FOR TELE-HEALTH VISIT   I hereby voluntarily request, consent and authorize [place] and its employed or contracted physicians, physician assistants, nurse practitioners or other licensed health care professionals (the Practitioner), to provide me with telemedicine health care services (the "Services") as deemed necessary by the treating Practitioner. I acknowledge and consent to receive the Services by the Practitioner via telemedicine. I understand that the telemedicine visit will involve communicating with the Practitioner through live audiovisual communication technology and the disclosure of certain medical information by electronic transmission. I acknowledge that I have been given the opportunity to request an in-person assessment or other available alternative prior to the telemedicine visit and am voluntarily participating in the telemedicine visit.  I understand that I have the right to withhold or withdraw my consent to the use of telemedicine in the course of my care at any time, without affecting my right to future care or treatment, and that the Practitioner or I may terminate the telemedicine visit at any time. I understand that I have the right to  inspect all information obtained and/or recorded in the course of the telemedicine visit and may receive copies of available information for a reasonable fee.  I understand that some of the potential risks of receiving the Services via telemedicine include:  [name] Delay or interruption in medical evaluation due to technological equipment failure or disruption; . Information transmitted may not be sufficient (e.g. poor resolution of images) to allow for appropriate medical decision making by the Practitioner; and/or  . In rare instances, security protocols could fail, causing a breach of personal health information.  Furthermore, I acknowledge that it is my responsibility to provide information about my medical history, conditions and care that is complete and accurate to the best of my ability. I acknowledge that Practitioner's advice, recommendations, and/or decision may be based on factors not within their control, such as incomplete or inaccurate data provided by me or distortions of diagnostic images or specimens that may result from electronic transmissions. I understand that the practice of medicine is not an exact science and that Practitioner makes no warranties or guarantees regarding treatment outcomes. I acknowledge that I will receive a copy of this consent  concurrently upon execution via email to the email address I last provided but may also request a printed copy by calling the office of [place].    I understand that my insurance will be billed for this visit.   I have read or had this consent read to me. . I understand the contents of this consent, which adequately explains the benefits and risks of the Services being provided via telemedicine.  . I have been provided ample opportunity to ask questions regarding this consent and the Services and have had my questions answered to my satisfaction. . I give my informed consent for the services to be provided through the use of  telemedicine in my medical care  By participating in this telemedicine visit I agree to the above.

## 2018-05-20 NOTE — Telephone Encounter (Signed)
Spoke with patient who confirmed all demographics.  He does not have a computer or smart phone.  He will only have his weight ready for the visit. Patient gave verbal consent for virtual visit.

## 2018-05-24 ENCOUNTER — Ambulatory Visit: Payer: Medicare Other | Admitting: Physician Assistant

## 2018-05-25 ENCOUNTER — Encounter: Payer: Self-pay | Admitting: Physician Assistant

## 2018-05-25 ENCOUNTER — Telehealth (INDEPENDENT_AMBULATORY_CARE_PROVIDER_SITE_OTHER): Payer: Medicare Other | Admitting: Physician Assistant

## 2018-05-25 ENCOUNTER — Telehealth: Payer: Self-pay | Admitting: *Deleted

## 2018-05-25 ENCOUNTER — Other Ambulatory Visit: Payer: Self-pay

## 2018-05-25 VITALS — Ht 74.0 in | Wt 258.0 lb

## 2018-05-25 DIAGNOSIS — I739 Peripheral vascular disease, unspecified: Secondary | ICD-10-CM

## 2018-05-25 DIAGNOSIS — I2510 Atherosclerotic heart disease of native coronary artery without angina pectoris: Secondary | ICD-10-CM | POA: Diagnosis not present

## 2018-05-25 DIAGNOSIS — N183 Chronic kidney disease, stage 3 unspecified: Secondary | ICD-10-CM

## 2018-05-25 DIAGNOSIS — I5042 Chronic combined systolic (congestive) and diastolic (congestive) heart failure: Secondary | ICD-10-CM

## 2018-05-25 DIAGNOSIS — R600 Localized edema: Secondary | ICD-10-CM

## 2018-05-25 DIAGNOSIS — Z79899 Other long term (current) drug therapy: Secondary | ICD-10-CM

## 2018-05-25 MED ORDER — FUROSEMIDE 20 MG PO TABS: 20.0000 mg | ORAL_TABLET | Freq: Every day | ORAL | 3 refills | Status: DC

## 2018-05-25 NOTE — Progress Notes (Signed)
Virtual Visit via Telephone Note   This visit type was conducted due to national recommendations for restrictions regarding the COVID-19 Pandemic (e.g. social distancing) in an effort to limit this patient's exposure and mitigate transmission in our community.  Due to his co-morbid illnesses, this patient is at least at moderate risk for complications without adequate follow up.  This format is felt to be most appropriate for this patient at this time.  The patient did not have access to video technology/had technical difficulties with video requiring transitioning to audio format only (telephone).  All issues noted in this document were discussed and addressed.  No physical exam could be performed with this format.  Please refer to the patient's chart for his  consent to telehealth for [facility].   Evaluation Performed:  Follow-up visit  Date:  05/25/2018   ID:  [name] [name], DOB 08/12/1931, MRN [id number]  Patient Location: Home Provider Location: Home  PCP:  [name], MD  Cardiologist:  Mertie Moores, MD  PV: Dr. Gwenlyn Found Chief Complaint:  6 month follow up  History of Present Illness:    [name] [name] is a 85 y.o. male with hx of CAD s/p CABG, PVD, DM, HTN, chronic systolic CHF and HLD seen for follow up.   Has seen Dr. Gwenlyn Found for peripheral vascular disease.  He high-grade segmental right and total left SFA stenosis with single-vessel runoff below the knees bilaterally.   Dr. Gwenlyn Found attempted unsuccessfully to percutaneously revascularize his left SFA.  Last echo 04/2015: LVEF of 45-50%, mild LVH, grade 2 DD, mild MR, moderately dilated LA and PA pressure of 82mm Hg.    Stress test 04/2015: Nuclear stress EF: 42%.  Defect 1: There is a large defect of severe severity present in the basal inferior, basal inferolateral, mid inferior and mid inferolateral location.  The left ventricular ejection fraction is moderately decreased (30-44%).  Findings consistent with  prior myocardial infarction.  This is an intermediate risk study.   Intermediate risk stress nuclear study with an extensive scar in the distribution of the left circumflex coronary artery (and minimal peri-infarct ischemia). Moderately reduced left ventricular regional global systolic function.  The patient does not have symptoms concerning for COVID-19 infection (fever, chills, cough, or new shortness of breath).   Today patient reports worsening bilateral lower extremity edema for past 10 days.  He has intermittent orthopnea and PND, once every month or so.  He has chronic claudication symptoms, seems stable however history difficult to obtain over phone.  He denies chest pain, palpitation or melena.  Compliant with his medication and low-sodium diet.   Past Medical History:  Diagnosis Date  . Benign prostatic hyperplasia (BPH) with urinary urge incontinence 11/13/2017  . Chronic left shoulder pain 11/13/2017  . Chronic non-seasonal allergic rhinitis 02/17/2006  . Chronic systolic heart failure ([place]) 10/31/2016   Echo (05/03/2015): LVEF 83-15%, grade 2 diastolic dysfunction  . 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 Essential hypertension 11/04/2005  . Gastroesophageal reflux disease with hiatal hernia 11/04/2005  . History of prostate cancer 02/17/2006   Diagnosed 1995, s/p seed implantation in the late 1990's, PSA undetectable 04/10/2016  . Hyperlipidemia 11/04/2005  . Obesity (BMI 30.0-34.9) 11/13/2017  . Peripheral arterial disease ([place]) 09/30/2007   with  claudication, failed attempt at LLE PTCA  . Type 2 diabetes mellitus with microalbuminuria, with long-term current use of insulin ([place]) 11/13/2017  . Type 2 diabetes  mellitus with mild 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, bilateral ([place]) 03/24/2018  . Type 2 diabetes mellitus with peripheral neuropathy ([place]) 11/04/2005  . Urethral stricture in male 11/13/2017   Noted on cystoscopy 12/18/2014.  Reportedly asked to do chronic intermittent self catheterizations, but has not.   Past Surgical History:  Procedure Laterality Date  . CARDIAC CATHETERIZATION  10/05/08   REVEALS HYPOKINESIS OF THE LATERAL WALL AND EF 50-55%  . CORONARY ANGIOPLASTY WITH STENT PLACEMENT    . CORONARY ARTERY BYPASS GRAFT N/A 01/05/2013   Procedure: CORONARY ARTERY BYPASS GRAFTING (CABG);  Surgeon: Melrose Nakayama, MD;  Location: [facility];  Service: Open Heart Surgery;  Laterality: N/A;  . INTRAOPERATIVE TRANSESOPHAGEAL ECHOCARDIOGRAM N/A 01/05/2013   Procedure: INTRAOPERATIVE TRANSESOPHAGEAL ECHOCARDIOGRAM;  Surgeon: Melrose Nakayama, MD;  Location: [facility];  Service: Open Heart Surgery;  Laterality: N/A;  . LEFT HEART CATHETERIZATION WITH CORONARY ANGIOGRAM N/A 01/03/2013   Procedure: LEFT HEART CATHETERIZATION WITH CORONARY ANGIOGRAM;  Surgeon: Lorretta Harp, MD;  Location: [facility] C[facility] LAB;  Service: Cardiovascular;  Laterality: N/A;  . LOWER EXTREMITY ANGIOGRAM Right 12/07/2012   unsuccessful attempt at percutaneous revascularization of a calcified long segment chronic total occlusion mid left SFA /notes 12/07/2012  . LOWER EXTREMITY ANGIOGRAM N/A 12/07/2012   Procedure: LOWER EXTREMITY ANGIOGRAM;  Surgeon: Lorretta Harp, MD;  Location: [facility] CATH LAB;  Service: Cardiovascular;  Laterality: N/A;  . PROSTATE SURGERY  1990's     Current Meds  Medication Sig  . amLODipine (NORVASC) 10 MG tablet Take 1 tablet (10 mg total) by mouth daily.  [name] aspirin EC 81 MG tablet Take 1 tablet (81 mg total) by mouth daily.  [name] atorvastatin (LIPITOR) 40 MG tablet TAKE 1 TABLET BY MOUTH ONCE DAILY  . cilostazol (PLETAL) 50 MG tablet Take 1 tablet (50 mg total) by mouth 2 (two) times  daily.  [name] gabapentin (NEURONTIN) 100 MG capsule Take 1 capsule (100 mg total) by mouth 3 (three) times daily.  [name] glucose blood (ONETOUCH VERIO) test strip USE AS INSTRUCTED TO CHECK BLOOD SUGAR TWICE DAILY  . hydrochlorothiazide (HYDRODIURIL) 25 MG tablet Take 1 tablet (25 mg total) by mouth daily.  . insulin NPH Human (HUMULIN N,NOVOLIN N) 100 UNIT/ML injection Inject 20 Units into the skin 2 (two) times daily.  . Insulin Regular Human (NOVOLIN R RELION IJ) Inject 15 Units as directed 2 (two) times daily before a meal.  . Insulin Syringe-Needle U-100 31G X 15/64" 0.3 ML MISC Use to inject insulin 4 times a day  . loratadine (ALLERGY RELIEF) 10 MG tablet Take 1 tablet (10 mg total) by mouth daily as needed for rhinitis.  [name] losartan (COZAAR) 50 MG tablet TAKE 1 TABLET BY MOUTH ONCE DAILY  . metFORMIN (GLUCOPHAGE-XR) 500 MG 24 hr tablet TAKE 2 TABLETS BY MOUTH ONCE DAILY WITH SUPPER  . metoprolol tartrate (LOPRESSOR) 25 MG tablet Take 1 tablet (25 mg total) by mouth 2 (two) times daily.  [name] omeprazole (PRILOSEC) 20 MG capsule Take 1 capsule by mouth daily.  Glory Rosebush DELICA LANCETS FINE MISC Use to check blood sugar 2 times per day dx code E11.49  . oxybutynin (DITROPAN) 5 MG tablet Take 1 tablet (5 mg total) by mouth 3 (three) times daily.  . tamsulosin (FLOMAX) 0.4 MG CAPS capsule Take 1 capsule (0.4 mg  total) by mouth daily.     Allergies:   Patient has no known allergies.   Social History   Tobacco Use  . Smoking status: Former Smoker    Packs/day: 0.75    Years: 20.00    Pack years: 15.00    Types: Cigarettes    Last attempt to quit: 07/08/1980    Years since quitting: 37.9  . Smokeless tobacco: Never Used  Substance Use Topics  . Alcohol use: No    Alcohol/week: 0.0 standard drinks    Comment: 12/07/2012 "quit drinking alcohol > 25 yr ago"  . Drug use: No     Family Hx: The patient's family history includes Healthy in his daughter, daughter, daughter, and son; Hypertension in  his father; Kidney failure in his mother; Other in his brother, brother, brother, brother, brother, brother, brother, brother, and sister; Stroke in his father.  ROS:   Please see the history of present illness.    All other systems reviewed and are negative.   Prior CV studies:   The following studies were reviewed today:  As above  Labs/Other Tests and Data Reviewed:    EKG:  No ECG reviewed.  Recent Labs: 09/03/2017: ALT 32 02/26/2018: BUN 25; Creatinine, Ser 1.54; Potassium 4.1; Sodium 135   Recent Lipid Panel Lab Results  Component Value Date/Time   CHOL 98 11/06/2017 10:16 AM   CHOL 86 (L) 10/31/2016 04:02 PM   TRIG 99.0 11/06/2017 10:16 AM   HDL 31.60 (L) 11/06/2017 10:16 AM   HDL 32 (L) 10/31/2016 04:02 PM   CHOLHDL 3 11/06/2017 10:16 AM   LDLCALC 47 11/06/2017 10:16 AM   LDLCALC 39 10/31/2016 04:02 PM    Wt Readings from Last 3 Encounters:  05/25/18 258 lb (117 kg)  03/26/18 264 lb 8 oz (120 kg)  02/26/18 260 lb 6.4 oz (118.1 kg)     Objective:    Vital Signs:  Ht 6\' 2"  (1.88 m)   Wt 258 lb (117 kg)   BMI 33.13 kg/m   He does have BP cuff at home  VITAL SIGNS:  reviewed GEN:  no acute distress NEURO:  alert and oriented x 3, PSYCH:  normal affect  ASSESSMENT & PLAN:    1. CAD s/p CABG -No angina.  Continue aspirin, statin and beta-blocker.  2.  Lower extremity edema -Patient reports worsening edema for past 10 days without weight gain.  He has chronic claudication symptoms, seems stable.  Intermittent orthopnea however occurring once or twice per month.  Compliant with low-sodium diet.  Differential includes heart failure exacerbation, worsening peripheral vascular disease or side effect of amlodipine. -Stop hydrochlorothiazide and start low-dose Lasix.  Patient with chronic kidney disease stage III with baseline creatinine of 1.3-1.5. -Will repeat bmet in 1 week with follow-up to further direct medical therapy.  3. Chronic combined CHF - AS above   4. PVD - Previously successful revascularization attempt - As above  4. HTN - No BP cuff at home  5. HLD - Continue statin  6. CAD Stage III  Seems he may need Home Health visit or in-person evaluation if no improvement.   COVID-19 Education: The signs and symptoms of COVID-19 were discussed with the patient and how to seek care for testing (follow up with PCP or arrange E-visit).  The importance of social distancing was discussed today.  Time:   Today, I have spent 15 minutes with the patient with telehealth technology discussing the above problems.     Medication  Adjustments/Labs and Tests Ordered: Current medicines are reviewed at length with the patient today.  Concerns regarding medicines are outlined above.   Tests Ordered: No orders of the defined types were placed in this encounter.   Medication Changes: No orders of the defined types were placed in this encounter.   Disposition:  Follow up in 1 week(s)  Signed, Leanor Kail, PA  05/25/2018 1:29 PM    [facility]

## 2018-05-25 NOTE — Telephone Encounter (Signed)
Call placed to pt to go over medication changes, lab instructions, and follow up appointment information with the pt to make sure he was clear on them. Pt is aware that he will d/c HCTZ and start Lasix 20 mg daily (rx sent to [facility], [address]) He will come to the office for a BMET 06/01/2018 Dr. Acie Fredrickson will do virtual phone call 4/292020 @3 :00.  Pt has been made aware that someone will call him 15 mins prior to his appt for his vitals and go over medications.  Pt thanked me for the call and to help him understand everything.

## 2018-05-25 NOTE — Patient Instructions (Addendum)
Medication Instructions:  Your physician has recommended you make the following change in your medication:  1.  STOP the Hydrochlorothiazide 2.  START Lasix 20 mg take 1 tablet daily  If you need a refill on your cardiac medications before your next appointment, please call your pharmacy.   Lab work: 06/01/2018:  COME TO THE OFFICE FOR LAB WORK ANYTIME IN THE MORNING ON Tuesday, 06/01/2018  If you have labs (blood work) drawn today and your tests are completely normal, you will receive your results only by: [name] MyChart Message (if you have MyChart) OR . A paper copy in the mail If you have any lab test that is abnormal or we need to change your treatment, we will call you to review the results.  Testing/Procedures: None ordered   Follow-Up: Your physician recommends that you schedule a follow-up appointment in: [place] PHONE CALL 06/02/2018 WITH DR. Acie Fredrickson AT 3:00. SOMEONE WILL CALL YOU 15 MINUTES PRIOR TO YOUR APPOINTMENT TO GET YOUR BLOOD PRESSURE, PULSE, AND OTHER VITAL SIGNS FROM YOU.   Any Other Special Instructions Will Be Listed Below (If Applicable).

## 2018-05-27 ENCOUNTER — Other Ambulatory Visit: Payer: Self-pay

## 2018-05-27 ENCOUNTER — Telehealth: Payer: Self-pay | Admitting: *Deleted

## 2018-05-27 ENCOUNTER — Ambulatory Visit (INDEPENDENT_AMBULATORY_CARE_PROVIDER_SITE_OTHER): Payer: Medicare Other | Admitting: Internal Medicine

## 2018-05-27 DIAGNOSIS — M109 Gout, unspecified: Secondary | ICD-10-CM

## 2018-05-27 DIAGNOSIS — M1A9XX0 Chronic gout, unspecified, without tophus (tophi): Secondary | ICD-10-CM | POA: Insufficient documentation

## 2018-05-27 MED ORDER — COLCHICINE 0.6 MG PO TABS: 0.6000 mg | ORAL_TABLET | Freq: Two times a day (BID) | ORAL | 0 refills | Status: DC

## 2018-05-27 NOTE — Assessment & Plan Note (Signed)
HPI:   Patient called in with complaints of 2-3 days of left toe pain and erythema. He states that it is very painful and he is unable to walk on it. He has had gout in the past. He has not tried anything for the pain. He denies fevers, chills, other signs of systemic infection. Review of records indicates that he was seen last year at this time by podiatry for gout. He has a uric acid on fill that was 7.9.   A/P: - Likely acute gout attack  - CKD so no NSAIDs - DM. Will avoid steroids  - Start Colchicine 0.6 mg BID  - If does not improve in 3-4 days patient will come into the clinic for further evaluation. He does have a history of PAD but this does not seem like a vascular process.

## 2018-05-27 NOTE — Telephone Encounter (Signed)
Pt is aware dr will call

## 2018-05-27 NOTE — Telephone Encounter (Signed)
Pt calls and states he is having a gout flare, L swollen, denies heat, redness, just swollen and painful for 2-3 days. He states he knows its rough on all the nurses and doctors right now and he hates to call but it hurts really bad. Please advise [phone]

## 2018-05-27 NOTE — Progress Notes (Signed)
   CC: L foot pain  This is a telephone encounter between [name] and [employer] on 05/27/2018 for L foot pain. The visit was conducted with the patient located at home and [facility] at [facility]. The patient's identity was confirmed using their DOB and current address. The patient has consented to being evaluated through a telephone encounter and understands the associated risks (an examination cannot be done and the patient may need to come in for an appointment) / benefits (allows the patient to remain at home, decreasing exposure to coronavirus). I personally spent 10 minutes on medical discussion.   HPI:  [name] [name] is a 85 y.o. with PMH as below.   Please see A&P for assessment of the patient's acute and chronic medical conditions.   Past Medical History:  Diagnosis Date  . Benign prostatic hyperplasia (BPH) with urinary urge incontinence 11/13/2017  . Chronic left shoulder pain 11/13/2017  . Chronic non-seasonal allergic rhinitis 02/17/2006  . Chronic systolic heart failure ([place]) 10/31/2016   Echo (05/03/2015): LVEF 62-26%, grade 2 diastolic dysfunction  . 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 Essential hypertension 11/04/2005  . Gastroesophageal reflux disease with hiatal hernia 11/04/2005  . History of prostate cancer 02/17/2006   Diagnosed 1995, s/p seed implantation in the late 1990's, PSA undetectable 04/10/2016  . Hyperlipidemia 11/04/2005  . Obesity (BMI 30.0-34.9) 11/13/2017  . Peripheral arterial disease ([place]) 09/30/2007   with claudication, failed attempt at LLE PTCA  . Type 2 diabetes mellitus with microalbuminuria, with long-term current use of insulin ([place]) 11/13/2017  . Type 2 diabetes  mellitus with mild 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, bilateral ([place]) 03/24/2018  . Type 2 diabetes mellitus with peripheral neuropathy ([place]) 11/04/2005  . Urethral stricture in male 11/13/2017   Noted on cystoscopy 12/18/2014.  Reportedly asked to do chronic intermittent self catheterizations, but has not.   Review of Systems:  Performed and all others negative.  Assessment & Plan:   See Encounters Tab for problem based charting.  Patient discussed with Dr. Dareen Piano

## 2018-05-27 NOTE — Telephone Encounter (Signed)
Please set him up for a telehealth visit

## 2018-05-28 NOTE — Progress Notes (Signed)
Internal Medicine Clinic Attending  Case discussed with Dr. Helberg at the time of the visit.  We reviewed the resident's history and exam and pertinent patient test results.  I agree with the assessment, diagnosis, and plan of care documented in the resident's note.    

## 2018-05-31 ENCOUNTER — Telehealth: Payer: Self-pay | Admitting: Cardiovascular Disease

## 2018-05-31 NOTE — Telephone Encounter (Signed)
Spoke with patient who confirmed all demographics. Patient declined My Chart. Will have vitals ready for visit. He does not have a smart phone.

## 2018-06-01 ENCOUNTER — Other Ambulatory Visit: Payer: Medicare Other

## 2018-06-01 ENCOUNTER — Other Ambulatory Visit: Payer: Self-pay

## 2018-06-01 DIAGNOSIS — Z79899 Other long term (current) drug therapy: Secondary | ICD-10-CM

## 2018-06-01 LAB — BASIC METABOLIC PANEL
BUN/Creatinine Ratio: 12 (ref 10–24)
BUN: 16 mg/dL (ref 8–27)
CO2: 23 mmol/L (ref 20–29)
Calcium: 9.3 mg/dL (ref 8.6–10.2)
Chloride: 102 mmol/L (ref 96–106)
Creatinine, Ser: 1.36 mg/dL — ABNORMAL HIGH (ref 0.76–1.27)
GFR calc Af Amer: 55 mL/min/{1.73_m2} — ABNORMAL LOW (ref >59)
GFR calc non Af Amer: 47 mL/min/{1.73_m2} — ABNORMAL LOW (ref >59)
Glucose: 116 mg/dL — ABNORMAL HIGH (ref 65–99)
Potassium: 3.8 mmol/L (ref 3.5–5.2)
Sodium: 141 mmol/L (ref 134–144)

## 2018-06-01 NOTE — Telephone Encounter (Signed)
Virtual Visit Pre-Appointment Phone Call  "([name]), I am calling you today to discuss your upcoming appointment. We are currently trying to limit exposure to the virus that causes COVID-19 by seeing patients at home rather than in the office."  1. "What is the BEST phone number to call the day of the visit?" - include this in appointment notes  2. Do you have or have access to (through a family member/friend) a smartphone with video capability that we can use for your visit?" a. If yes - list this number in appt notes as cell (if different from BEST phone #) and list the appointment type as a VIDEO visit in appointment notes b. If no - list the appointment type as a PHONE visit in appointment notes  Confirm consent - "In the setting of the current Covid19 crisis, you are scheduled for a (phone or video) visit with your provider on (date) at (time).  Just as we do with many in-office visits, in order for you to participate in this visit, we must obtain consent.  If you'd like, I can send this to your mychart (if signed up) or email for you to review.  Otherwise, I can obtain your verbal consent now.  All virtual visits are billed to your insurance company just like a normal visit would be.  By agreeing to a virtual visit, we'd like you to understand that the technology does not allow for your provider to perform an examination, and thus may limit your provider's ability to fully assess your condition. If your provider identifies any concerns that need to be evaluated in person, we will make arrangements to do so.  Finally, though the technology is pretty good, we cannot assure that it will always work on either your or our end, and in the setting of a video visit, we may have to convert it to a phone-only visit.  In either situation, we cannot ensure that we have a secure connection.  Are you willing to proceed?"  YES  3. Advise patient to be prepared - "Two hours prior to your appointment,  go ahead and check your blood pressure, pulse, oxygen saturation, and your weight (if you have the equipment to check those) and write them all down. When your visit starts, your provider will ask you for this information. If you have an Apple Watch or Kardia device, please plan to have heart rate information ready on the day of your appointment. Please have a pen and paper handy nearby the day of the visit as well."  4. Give patient instructions for MyChart download to smartphone OR [email or web address] as below if video visit (depending on what platform provider is using)  5. Inform patient they will receive a phone call 15 minutes prior to their appointment time (may be from unknown caller ID) so they should be prepared to answer    TELEPHONE CALL NOTE  Harshith Pursell has been deemed a candidate for a follow-up tele-health visit to limit community exposure during the Covid-19 pandemic. I spoke with the patient via phone to ensure availability of phone/video source, confirm preferred email & phone number, and discuss instructions and expectations.  I reminded [name] to be prepared with any vital sign and/or heart rhythm information that could potentially be obtained via home monitoring, at the time of his visit. I reminded [name] to expect a phone call prior to his visit.  Ardelle Anton 06/01/2018 11:05 AM   INSTRUCTIONS FOR DOWNLOADING THE  MYCHART APP TO SMARTPHONE  - The patient must first make sure to have activated MyChart and know their login information - If Apple, go to [employer] and type in MyChart in the search bar and download the app. If Android, ask patient to go to [employer] and type in [place] in the search bar and download the app. The app is free but as with any other app downloads, their phone may require them to verify saved payment information or Apple/Android password.  - The patient will need to then log into the app with their MyChart  username and password, and select [facility] as their healthcare provider to link the account. When it is time for your visit, go to the MyChart app, find appointments, and click Begin Video Visit. Be sure to Select Allow for your device to access the Microphone and Camera for your visit. You will then be connected, and your provider will be with you shortly.        FULL LENGTH CONSENT FOR TELE-HEALTH VISIT   I hereby voluntarily request, consent and authorize [facility] and its employed or contracted physicians, physician assistants, nurse practitioners or other licensed health care professionals (the Practitioner), to provide me with telemedicine health care services (the Services") as deemed necessary by the treating Practitioner. I acknowledge and consent to receive the Services by the Practitioner via telemedicine. I understand that the telemedicine visit will involve communicating with the Practitioner through live audiovisual communication technology and the disclosure of certain medical information by electronic transmission. I acknowledge that I have been given the opportunity to request an in-person assessment or other available alternative prior to the telemedicine visit and am voluntarily participating in the telemedicine visit.  I understand that I have the right to withhold or withdraw my consent to the use of telemedicine in the course of my care at any time, without affecting my right to future care or treatment, and that the Practitioner or I may terminate the telemedicine visit at any time. I understand that I have the right to inspect all information obtained and/or recorded in the course of the telemedicine visit and may receive copies of available information for a reasonable fee.  I understand that some of the potential risks of receiving the Services via telemedicine include:   Delay or interruption in medical evaluation due to technological equipment failure or  disruption;  Information transmitted may not be sufficient (e.g. poor resolution of images) to allow for appropriate medical decision making by the Practitioner; and/or   In rare instances, security protocols could fail, causing a breach of personal health information.  Furthermore, I acknowledge that it is my responsibility to provide information about my medical history, conditions and care that is complete and accurate to the best of my ability. I acknowledge that Practitioner's advice, recommendations, and/or decision may be based on factors not within their control, such as incomplete or inaccurate data provided by me or distortions of diagnostic images or specimens that may result from electronic transmissions. I understand that the practice of medicine is not an exact science and that Practitioner makes no warranties or guarantees regarding treatment outcomes. I acknowledge that I will receive a copy of this consent concurrently upon execution via email to the email address I last provided but may also request a printed copy by calling the office of [facility].    I understand that my insurance will be billed for this visit.   I have read or had  this consent read to me.  I understand the contents of this consent, which adequately explains the benefits and risks of the Services being provided via telemedicine.   I have been provided ample opportunity to ask questions regarding this consent and the Services and have had my questions answered to my satisfaction.  I give my informed consent for the services to be provided through the use of telemedicine in my medical care  By participating in this telemedicine visit I agree to the above.

## 2018-06-02 ENCOUNTER — Telehealth (INDEPENDENT_AMBULATORY_CARE_PROVIDER_SITE_OTHER): Payer: Medicare Other | Admitting: Cardiovascular Disease

## 2018-06-02 VITALS — Ht 69.0 in | Wt 265.0 lb

## 2018-06-02 DIAGNOSIS — Z7189 Other specified counseling: Secondary | ICD-10-CM | POA: Diagnosis not present

## 2018-06-02 DIAGNOSIS — I2510 Atherosclerotic heart disease of native coronary artery without angina pectoris: Secondary | ICD-10-CM

## 2018-06-02 DIAGNOSIS — I10 Essential (primary) hypertension: Secondary | ICD-10-CM

## 2018-06-02 DIAGNOSIS — E7800 Pure hypercholesterolemia, unspecified: Secondary | ICD-10-CM

## 2018-06-02 NOTE — Progress Notes (Signed)
Virtual Visit via Telephone Note   This visit type was conducted due to national recommendations for restrictions regarding the COVID-19 Pandemic (e.g. social distancing) in an effort to limit this patient's exposure and mitigate transmission in our community.  Due to his co-morbid illnesses, this patient is at least at moderate risk for complications without adequate follow up.  This format is felt to be most appropriate for this patient at this time.  The patient did not have access to video technology/had technical difficulties with video requiring transitioning to audio format only (telephone).  All issues noted in this document were discussed and addressed.  No physical exam could be performed with this format.  Please refer to the patient's chart for his  consent to telehealth for [facility].   Evaluation Performed:  Follow-up visit  Date:  06/02/2018   ID:  [name] [name], DOB 03-16-1931, MRN [id number]  Patient Location: Home Provider Location: Home  PCP:  [name], MD  Cardiologist:  Mertie Moores, MD  Electrophysiologist:  None   Chief Complaint: Coronary artery disease-status post coronary artery bypass grafting, peripheral vascular disease hypertension hyperlipidemia   Problem List 1. CAD - status post stenting and   status post coronary artery bypass grafting 2. Peripheral vascular disease 3. Diabetes mellitus 4. Hypertension 5. Hyperlipidemia    [name] [name] is a 85 y.o. gentleman with a Hx of CAD - s/p stenting, diabetes mellitus, hypertension and hyperlipidemia. He presents today with the complaint of midsternal chest pain. This typically occurs after he eats some food and then goes out and doesn't work. It resolves after about 30 minutes.  He did not take any nitroglycerin. He took some Prilosec and it eventually resolved.   Jun 29, 2013:  [name] [name] has had CABG since I last saw him.    His CABG was complicated by several things - hematura and now  he has urinary incontinence and wears depends.  Nov. 24, 2015:  [name] [name] is s/p CABG. He has CAD, DM and now has urninary incontinence.   Has seen Dr. Jeffie Pollock who does not think there are any options for his incontence.   September 18, 2014:  Doing well.  Has some indigestion .   Has seen Dr. Gwenlyn Found for peripheral vascular disease.  He high-grade segmental right and total left SFA stenosis with single-vessel runoff below the knees bilaterally.   Dr. Gwenlyn Found attempted unsuccessfully to percutaneously revascularize his left SFA.  Feb. 13, 2017:  No CP , No dyspnea  Dr. Gwenlyn Found was not able to revascularized his left SFA.    Has leg pain with walking - right > left ,  Aug. 16, 2017:  [name] [name] is seen today for follow-up visit. He has a history of coronary artery disease and peripheral vascular disease. Has seen Dr. Gwenlyn Found - attempted SFA revascularization but it was unsuccessful.   No cardiac complaints. Has significant claudication with any walking .  Has gotten some relief with gabapentin - causes a dry mouth  Feb. 12, 2018:  Still eating some salty foods.  Some hot dogs, canned food, sausage  His blood pressure has been a little elevated and he has been having headaches on a regular basis.  Sept. 28, 2018:  [name] [name]  is feeling better. He's cut out a lot of his salty foods. Blood pressure is well-controlled. He thinks that one of his meds is causing a dry mouth may be causing him to have a dry mouth.   May 19, 2017  [name] [name] is doing  well .  No CP or dyspnea.    Some pain in right leg with walking - if he goes to far.  Able to do most of his usual activities without too much trouble   November 25, 2017:  [name] [name] is seen today for follow-up visit.  He has a history of coronary artery disease and peripheral vascular disease. He has a history of hypertension and hyperlipidemia. No CP or dyspnea.   Has left shoulder pain when the weather is cold .  Has some  claudicatin when he walks.    June 02, 2018   [name] [name] is a 85 y.o. male with History of coronary artery disease.  We performed a telephone visit with him today as part of follow-up.  Was last seen in the office 7 months ago  Has had some gout issues  Has some shortness of breath. No CP  No syncope   The patient does not have symptoms concerning for COVID-19 infection (fever, chills, cough, or new shortness of breath).    Past Medical History:  Diagnosis Date   Benign prostatic hyperplasia (BPH) with urinary urge incontinence 11/13/2017   Chronic left shoulder pain 11/13/2017   Chronic non-seasonal allergic rhinitis 0/81/4481   Chronic systolic heart failure ([name] [name]) 10/31/2016   Echo (05/03/2015): LVEF 85-63%, grade 2 diastolic dysfunction   Coronary artery disease involving native coronary artery of native heart without angina pectoris 09/30/2007   s/p CABG on 01/05/2013: left internal mammary artery to left anterior descending, saphenous vein graft to obtuse marginal 1, sequential saphenous vein graft to acute marginal and posterior descending   Essential hypertension 11/04/2005   Gastroesophageal reflux disease with hiatal hernia 11/04/2005   History of prostate cancer 02/17/2006   Diagnosed 1995, s/p seed implantation in the late 1990's, PSA undetectable 04/10/2016   Hyperlipidemia 11/04/2005   Obesity (BMI 30.0-34.9) 11/13/2017   Peripheral arterial disease ([name] [name]) 09/30/2007   with claudication, failed attempt at LLE PTCA   Type 2 diabetes mellitus with microalbuminuria, with long-term current use of insulin ([place]) 11/13/2017   Type 2 diabetes mellitus with mild 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, bilateral ([name] [name]) 03/24/2018   Type 2 diabetes mellitus with peripheral neuropathy ([name] [name]) 11/04/2005   Urethral stricture in male 11/13/2017   Noted on cystoscopy 12/18/2014.  Reportedly asked to do chronic intermittent self catheterizations, but has  not.   Past Surgical History:  Procedure Laterality Date   CARDIAC CATHETERIZATION  10/05/08   REVEALS HYPOKINESIS OF THE LATERAL WALL AND EF 50-55%   CORONARY ANGIOPLASTY WITH STENT PLACEMENT     CORONARY ARTERY BYPASS GRAFT N/A 01/05/2013   Procedure: CORONARY ARTERY BYPASS GRAFTING (CABG);  Surgeon: Melrose Nakayama, MD;  Location: [facility];  Service: Open Heart Surgery;  Laterality: N/A;   INTRAOPERATIVE TRANSESOPHAGEAL ECHOCARDIOGRAM N/A 01/05/2013   Procedure: INTRAOPERATIVE TRANSESOPHAGEAL ECHOCARDIOGRAM;  Surgeon: Melrose Nakayama, MD;  Location: [facility];  Service: Open Heart Surgery;  Laterality: N/A;   LEFT HEART CATHETERIZATION WITH CORONARY ANGIOGRAM N/A 01/03/2013   Procedure: LEFT HEART CATHETERIZATION WITH CORONARY ANGIOGRAM;  Surgeon: Lorretta Harp, MD;  [facility];  Service: Cardiovascular;  Laterality: N/A;   LOWER EXTREMITY ANGIOGRAM Right 12/07/2012   unsuccessful attempt at percutaneous revascularization of a calcified long segment chronic total occlusion mid left SFA /notes 12/07/2012   LOWER EXTREMITY ANGIOGRAM N/A 12/07/2012   Procedure: LOWER EXTREMITY ANGIOGRAM;  Surgeon: Lorretta Harp, MD;  Location: [facility] CATH LAB;  Service: Cardiovascular;  Laterality: N/A;  PROSTATE SURGERY  1990's     Current Meds  Medication Sig   amLODipine (NORVASC) 10 MG tablet Take 1 tablet (10 mg total) by mouth daily.   aspirin EC 81 MG tablet Take 1 tablet (81 mg total) by mouth daily.   atorvastatin (LIPITOR) 40 MG tablet TAKE 1 TABLET BY MOUTH ONCE DAILY   cilostazol (PLETAL) 50 MG tablet Take 1 tablet (50 mg total) by mouth 2 (two) times daily.   colchicine 0.6 MG tablet Take 1 tablet (0.6 mg total) by mouth 2 (two) times daily for 14 days.   furosemide (LASIX) 20 MG tablet Take 1 tablet (20 mg total) by mouth daily.   gabapentin (NEURONTIN) 100 MG capsule Take 1 capsule (100 mg total) by mouth 3 (three) times daily.   glucose blood (ONETOUCH VERIO) test  strip USE AS INSTRUCTED TO CHECK BLOOD SUGAR TWICE DAILY   insulin NPH Human (HUMULIN N,NOVOLIN N) 100 UNIT/ML injection Inject 20 Units into the skin 2 (two) times daily.   Insulin Regular Human (NOVOLIN R RELION IJ) Inject 15 Units as directed 2 (two) times daily before a meal.   Insulin Syringe-Needle U-100 31G X 15/64" 0.3 ML MISC Use to inject insulin 4 times a day   loratadine (ALLERGY RELIEF) 10 MG tablet Take 1 tablet (10 mg total) by mouth daily as needed for rhinitis.   losartan (COZAAR) 50 MG tablet TAKE 1 TABLET BY MOUTH ONCE DAILY   metFORMIN (GLUCOPHAGE-XR) 500 MG 24 hr tablet TAKE 2 TABLETS BY MOUTH ONCE DAILY WITH SUPPER   metoprolol tartrate (LOPRESSOR) 25 MG tablet Take 1 tablet (25 mg total) by mouth 2 (two) times daily.   omeprazole (PRILOSEC) 20 MG capsule Take 1 capsule by mouth daily.   ONETOUCH DELICA LANCETS FINE MISC Use to check blood sugar 2 times per day dx code E11.49   oxybutynin (DITROPAN) 5 MG tablet Take 1 tablet (5 mg total) by mouth 3 (three) times daily.   tamsulosin (FLOMAX) 0.4 MG CAPS capsule Take 1 capsule (0.4 mg total) by mouth daily.     Allergies:   Patient has no known allergies.   Social History   Tobacco Use   Smoking status: Former Smoker    Packs/day: 0.75    Years: 20.00    Pack years: 15.00    Types: Cigarettes    Last attempt to quit: 07/08/1980    Years since quitting: 37.9   Smokeless tobacco: Never Used  Substance Use Topics   Alcohol use: No    Alcohol/week: 0.0 standard drinks    Comment: 12/07/2012 "quit drinking alcohol > 25 yr ago"   Drug use: No     Family Hx: The patient's family history includes Healthy in his daughter, daughter, daughter, and son; Hypertension in his father; Kidney failure in his mother; Other in his brother, brother, brother, brother, brother, brother, brother, brother, and sister; Stroke in his father.  ROS:   Please see the history of present illness.     All other systems reviewed  and are negative.   Prior CV studies:   The following studies were reviewed today:    Labs/Other Tests and Data Reviewed:    EKG:  No ECG reviewed.  Recent Labs: 09/03/2017: ALT 32 06/01/2018: BUN 16; Creatinine, Ser 1.36; Potassium 3.8; Sodium 141   Recent Lipid Panel Lab Results  Component Value Date/Time   CHOL 98 11/06/2017 10:16 AM   CHOL 86 (L) 10/31/2016 04:02 PM   TRIG 99.0 11/06/2017 10:16  AM   HDL 31.60 (L) 11/06/2017 10:16 AM   HDL 32 (L) 10/31/2016 04:02 PM   CHOLHDL 3 11/06/2017 10:16 AM   LDLCALC 47 11/06/2017 10:16 AM   LDLCALC 39 10/31/2016 04:02 PM    Wt Readings from Last 3 Encounters:  06/02/18 265 lb (120.2 kg)  05/25/18 258 lb (117 kg)  03/26/18 264 lb 8 oz (120 kg)     Objective:    Vital Signs:  Ht 5\' 9"  (1.753 m)    Wt 265 lb (120.2 kg)    BMI 39.13 kg/m    No exam today - telephone visit    ASSESSMENT & PLAN:    1. CAD:   No angina.   Seems to be stable 2. HTN:  Tries to limit his salt.   3. Hyperlipidemia:   Continue atorvastatin  4. PVD :  No ischemic symptoms  5.  gout : better   COVID-19 Education: The signs and symptoms of COVID-19 were discussed with the patient and how to seek care for testing (follow up with PCP or arrange E-visit).  The importance of social distancing was discussed today.  Time:   Today, I have spent  15  minutes with the patient with telehealth technology discussing the above problems.     Medication Adjustments/Labs and Tests Ordered: Current medicines are reviewed at length with the patient today.  Concerns regarding medicines are outlined above.   Tests Ordered: No orders of the defined types were placed in this encounter.   Medication Changes: No orders of the defined types were placed in this encounter.   Disposition:  Follow up in 6 month(s)  Signed, Mertie Moores, MD  06/02/2018 2:48 PM    [place]

## 2018-06-02 NOTE — Patient Instructions (Signed)

## 2018-06-11 ENCOUNTER — Encounter (HOSPITAL_COMMUNITY): Payer: Self-pay | Admitting: Emergency Medicine

## 2018-06-11 ENCOUNTER — Ambulatory Visit (HOSPITAL_COMMUNITY)
Admission: EM | Admit: 2018-06-11 | Discharge: 2018-06-11 | Disposition: A | Discharge status: Home / Self Care | Payer: Medicare Other | Attending: Family Medicine | Admitting: Family Medicine

## 2018-06-11 ENCOUNTER — Telehealth: Payer: Self-pay | Admitting: *Deleted

## 2018-06-11 DIAGNOSIS — R319 Hematuria, unspecified: Secondary | ICD-10-CM | POA: Insufficient documentation

## 2018-06-11 DIAGNOSIS — I10 Essential (primary) hypertension: Secondary | ICD-10-CM | POA: Diagnosis not present

## 2018-06-11 DIAGNOSIS — R42 Dizziness and giddiness: Secondary | ICD-10-CM | POA: Diagnosis not present

## 2018-06-11 DIAGNOSIS — N390 Urinary tract infection, site not specified: Secondary | ICD-10-CM | POA: Diagnosis not present

## 2018-06-11 DIAGNOSIS — E1165 Type 2 diabetes mellitus with hyperglycemia: Secondary | ICD-10-CM | POA: Diagnosis not present

## 2018-06-11 LAB — POCT URINALYSIS DIP (DEVICE)
Bilirubin Urine: NEGATIVE
Glucose, UA: NEGATIVE mg/dL
Ketones, ur: NEGATIVE mg/dL
Nitrite: NEGATIVE
Protein, ur: 30 mg/dL — AB
Specific Gravity, Urine: 1.025 (ref 1.005–1.030)
Urobilinogen, UA: 0.2 mg/dL (ref 0.0–1.0)
pH: 5.5 (ref 5.0–8.0)

## 2018-06-11 LAB — GLUCOSE, CAPILLARY: Glucose-Capillary: 172 mg/dL — ABNORMAL HIGH (ref 70–99)

## 2018-06-11 MED ORDER — CEPHALEXIN 500 MG PO CAPS: 500.0000 mg | ORAL_CAPSULE | Freq: Two times a day (BID) | ORAL | 0 refills | Status: DC

## 2018-06-11 NOTE — Discharge Instructions (Addendum)
Your EKG was normal Your blood sugar was 172 You may have a UTI based on your urine We will go ahead and treat this with antibiotics. Make sure that you are drinking plenty of fluids and staying hydrated.  Be careful not to inject too much insulin.  If you have another episode please go to the hospital.  Follow up with primary care doctor.

## 2018-06-11 NOTE — Telephone Encounter (Signed)
Pt states he got up this am, took his medicine and then developed on of the worst h/a's he has ever had, he states its not the very worse but it's real bad, states he is dizzy and not able to move around well. Denies n&v, chest pain, short of breath, when ask if his vision has changed he answered "not yet but it wants to" he describes feeling "just bad".  He is ask to go to [facility], not drive and go now, he is agreeable with this

## 2018-06-11 NOTE — ED Notes (Signed)
Patient states he is here because he took his "gout" medicine and it made him feel "swimmy headed". Patient called his PCP today and said he had a headache from the medicine, they sent him here. Patient denies headache at this time. Denies dizziness. Patient is AAOX4, neuro assessment done, no grip weakness, no drift, ambulatory with steady gait. Patient is a poor historian to his complaints and timeline of events. States he checked his sugar this morning and it was 120s.[name]

## 2018-06-11 NOTE — Telephone Encounter (Signed)
Agree with plan.  May ultimately need ED.

## 2018-06-11 NOTE — ED Provider Notes (Signed)
[facility]    CSN: [id number] Arrival date & time: 06/11/18  1348     History   Chief Complaint No chief complaint on file.   HPI [name] [name] is a 85 y.o. male.   Pt is a 85 year old male with past medical history of BPH, allergies, CHF, CAD, prostate cancer, reflux, obesity, hyperlipidemia, diabetes type 2. He presents with episode of dizziness this am.  He reports that he woke up around 8:00 this morning and checked his blood sugar that was 126.  He then proceeded to take his insulin and other medications as prescribed.  He reports that he possibly took too much insulin.  He shortly after began having some dizziness and a funny sensation.  Reports felt similar to the other hypoglycemic episodes.  He then proceeded to drink a glass of orange juice and shortly felt much better.  He has been feeling normal ever since.  He was concerned about the colchicine he was taking and possible medication reaction.  He has been taking this medicine since being treated for gout on 05/27/2018.  He has had no issues so far.  He has 2 pills left.  Reports the gout has mostly resolved.  He denies any associated chest pain, shortness of breath, palpitations, headache, blurred vision, weakness, slurred speech, cough, congestion, fevers. Denies any problem with bowel or bladder.   ROS per HPI      Past Medical History:  Diagnosis Date  . Benign prostatic hyperplasia (BPH) with urinary urge incontinence 11/13/2017  . Chronic left shoulder pain 11/13/2017  . Chronic non-seasonal allergic rhinitis 02/17/2006  . Chronic systolic heart failure ([place]) 10/31/2016   Echo (05/03/2015): LVEF 02-72%, grade 2 diastolic dysfunction  . Coronary artery disease involving native coronary artery of native heart without angina pectoris 09/30/2007   s/p CABG on 01/05/2013: left internal mammary artery to left anterior descending, saphenous vein graft to obtuse marginal 1, sequential saphenous vein graft to acute  marginal and posterior descending  . Essential hypertension 11/04/2005  . Gastroesophageal reflux disease with hiatal hernia 11/04/2005  . History of prostate cancer 02/17/2006   Diagnosed 1995, s/p seed implantation in the late 1990's, PSA undetectable 04/10/2016  . Hyperlipidemia 11/04/2005  . Obesity (BMI 30.0-34.9) 11/13/2017  . Peripheral arterial disease ([place]) 09/30/2007   with claudication, failed attempt at LLE PTCA  . Type 2 diabetes mellitus with microalbuminuria, with long-term current use of insulin ([place]) 11/13/2017  . Type 2 diabetes mellitus with mild 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, bilateral ([place]) 03/24/2018  . Type 2 diabetes mellitus with peripheral neuropathy ([place]) 11/04/2005  . Urethral stricture in male 11/13/2017   Noted on cystoscopy 12/18/2014.  Reportedly asked to do chronic intermittent self catheterizations, but has not.    Patient Active Problem List   Diagnosis Date Noted  . Acute gout involving toe of left foot 05/27/2018  . Constipation, unspecified 03/26/2018  . Type 2 diabetes mellitus with mild 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, bilateral ([place]) 03/24/2018  . Type 2 diabetes mellitus with microalbuminuria, with long-term current use of insulin ([place]) 11/13/2017  . Healthcare maintenance 11/13/2017  . Chronic left shoulder pain 11/13/2017  . Obesity (BMI 30.0-34.9) 11/13/2017  . Urethral stricture in male 11/13/2017  . Benign prostatic hyperplasia (BPH) with urinary urge incontinence 11/13/2017  . Chronic systolic heart failure ([place]) 10/31/2016  . Coronary artery disease involving native coronary artery of native heart without angina pectoris 09/30/2007  . Peripheral arterial disease ([place]) 09/30/2007  .  Chronic non-seasonal allergic rhinitis 02/17/2006  . History of prostate cancer 02/17/2006  . Type 2 diabetes mellitus with peripheral neuropathy ([place]) 11/04/2005  . Hyperlipidemia 11/04/2005  . Essential  hypertension 11/04/2005  . Gastroesophageal reflux disease with hiatal hernia 11/04/2005    Past Surgical History:  Procedure Laterality Date  . CARDIAC CATHETERIZATION  10/05/08   REVEALS HYPOKINESIS OF THE LATERAL WALL AND EF 50-55%  . CORONARY ANGIOPLASTY WITH STENT PLACEMENT    . CORONARY ARTERY BYPASS GRAFT N/A 01/05/2013   Procedure: CORONARY ARTERY BYPASS GRAFTING (CABG);  Surgeon: Melrose Nakayama, MD;  Location: [facility];  Service: Open Heart Surgery;  Laterality: N/A;  . INTRAOPERATIVE TRANSESOPHAGEAL ECHOCARDIOGRAM N/A 01/05/2013   Procedure: INTRAOPERATIVE TRANSESOPHAGEAL ECHOCARDIOGRAM;  Surgeon: Melrose Nakayama, MD;  Location: [facility];  Service: Open Heart Surgery;  Laterality: N/A;  . LEFT HEART CATHETERIZATION WITH CORONARY ANGIOGRAM N/A 01/03/2013   Procedure: LEFT HEART CATHETERIZATION WITH CORONARY ANGIOGRAM;  Surgeon: Lorretta Harp, MD;  Location[facility] C[facility] LAB;  Service: Cardiovascular;  Laterality: N/A;  . LOWER EXTREMITY ANGIOGRAM Right 12/07/2012   unsuccessful attempt at percutaneous revascularization of a calcified long segment chronic total occlusion mid left SFA /notes 12/07/2012  . LOWER EXTREMITY ANGIOGRAM N/A 12/07/2012   Procedure: LOWER EXTREMITY ANGIOGRAM;  Surgeon: Lorretta Harp, MD;  Location: [facility] CATH LAB;  Service: Cardiovascular;  Laterality: N/A;  . PROSTATE SURGERY  1990's       Home Medications    Prior to Admission medications   Medication Sig Start Date End Date Taking? Authorizing Provider  amLODipine (NORVASC) 10 MG tablet Take 1 tablet (10 mg total) by mouth daily. 02/24/18   Nahser, Wonda Cheng, MD  aspirin EC 81 MG tablet Take 1 tablet (81 mg total) by mouth daily. 10/31/16   Nahser, Wonda Cheng, MD  atorvastatin (LIPITOR) 40 MG tablet TAKE 1 TABLET BY MOUTH ONCE DAILY 10/15/17   Nahser, Wonda Cheng, MD  cephALEXin (KEFLEX) 500 MG capsule Take 1 capsule (500 mg total) by mouth 2 (two) times daily. 06/11/18   Loura Halt A, NP  cilostazol (PLETAL) 50  MG tablet Take 1 tablet (50 mg total) by mouth 2 (two) times daily. 12/14/17   Oval Linsey, MD  colchicine 0.6 MG tablet Take 1 tablet (0.6 mg total) by mouth 2 (two) times daily for 14 days. 05/27/18 06/10/18  Ina Homes, MD  furosemide (LASIX) 20 MG tablet Take 1 tablet (20 mg total) by mouth daily. 05/25/18 08/23/18  Leanor Kail, PA  gabapentin (NEURONTIN) 100 MG capsule Take 1 capsule (100 mg total) by mouth 3 (three) times daily. 06/05/17   Elayne Snare, MD  glucose blood (ONETOUCH VERIO) test strip USE AS INSTRUCTED TO CHECK BLOOD SUGAR TWICE DAILY 01/05/18   Elayne Snare, MD  insulin NPH Human (HUMULIN N,NOVOLIN N) 100 UNIT/ML injection Inject 20 Units into the skin 2 (two) times daily.    [provider]  Insulin Regular Human (NOVOLIN R RELION IJ) Inject 15 Units as directed 2 (two) times daily before a meal.    [provider]  Insulin Syringe-Needle U-100 31G X 15/64" 0.3 ML MISC Use to inject insulin 4 times a day 11/27/17   Oval Linsey, MD  loratadine (ALLERGY RELIEF) 10 MG tablet Take 1 tablet (10 mg total) by mouth daily as needed for rhinitis. 02/26/18   Oval Linsey, MD  losartan (COZAAR) 50 MG tablet TAKE 1 TABLET BY MOUTH ONCE DAILY 03/11/18   Elayne Snare, MD  metFORMIN ([place]) 500  MG 24 hr tablet TAKE 2 TABLETS BY MOUTH ONCE DAILY WITH SUPPER 05/12/18   Elayne Snare, MD  metoprolol tartrate (LOPRESSOR) 25 MG tablet Take 1 tablet (25 mg total) by mouth 2 (two) times daily. 02/24/18   Nahser, Wonda Cheng, MD  omeprazole (PRILOSEC) 20 MG capsule Take 1 capsule by mouth daily. 12/19/14   [provider]  Jonetta Speak LANCETS FINE MISC Use to check blood sugar 2 times per day dx code E11.49 07/20/14   Elayne Snare, MD  oxybutynin (DITROPAN) 5 MG tablet Take 1 tablet (5 mg total) by mouth 3 (three) times daily. 02/26/18   Oval Linsey, MD  tamsulosin (FLOMAX) 0.4 MG CAPS capsule Take 1 capsule (0.4 mg total) by mouth daily. 04/01/18   Oval Linsey, MD    Family History Family History  Problem Relation Age of Onset  . Kidney failure Mother   . Hypertension Father   . Stroke Father   . Other Sister   . Other Brother   . Other Brother   . Other Brother   . Other Brother   . Other Brother   . Other Brother   . Other Brother   . Other Brother   . Healthy Daughter   . Healthy Daughter   . Healthy Daughter   . Healthy Son     Social History Social History   Tobacco Use  . Smoking status: Former Smoker    Packs/day: 0.75    Years: 20.00    Pack years: 15.00    Types: Cigarettes    Last attempt to quit: 07/08/1980    Years since quitting: 37.9  . Smokeless tobacco: Never Used  Substance Use Topics  . Alcohol use: No    Alcohol/week: 0.0 standard drinks    Comment: 12/07/2012 "quit drinking alcohol > 25 yr ago"  . Drug use: No     Allergies   Patient has no known allergies.   Review of Systems Review of Systems   Physical Exam Triage Vital Signs ED Triage Vitals  Enc Vitals Group     BP 06/11/18 1421 (!) 148/95     Pulse Rate 06/11/18 1415 67     Resp 06/11/18 1415 (!) 26     Temp 06/11/18 1415 (!) 97.5 F (36.4 C)     Temp src --      SpO2 06/11/18 1415 97 %     Weight --      Height --      Head Circumference --      Peak Flow --      Pain Score 06/11/18 1420 0     Pain Loc --      Pain Edu? --      Excl. in [place]? --    No data found.  Updated Vital Signs BP (!) 148/95   Pulse 67   Temp (!) 97.5 F (36.4 C)   Resp (!) 26   SpO2 97%   Visual Acuity Right Eye Distance:   Left Eye Distance:   Bilateral Distance:    Right Eye Near:   Left Eye Near:    Bilateral Near:     Physical Exam Vitals signs and nursing note reviewed.  Constitutional:      General: He is not in acute distress.    Appearance: Normal appearance. He is not ill-appearing, toxic-appearing or diaphoretic.  HENT:     Head: Normocephalic and atraumatic.     Nose: Nose normal.     Mouth/Throat:  Pharynx: Oropharynx is clear.  Eyes:     Conjunctiva/sclera: Conjunctivae normal.  Neck:     Musculoskeletal: Normal range of motion.  Cardiovascular:     Rate and Rhythm: Normal rate and regular rhythm.     Pulses: Normal pulses.     Heart sounds: Normal heart sounds.  Pulmonary:     Effort: Pulmonary effort is normal.  Musculoskeletal: Normal range of motion.     Right lower leg: No edema.     Left lower leg: No edema.  Skin:    General: Skin is warm and dry.  Neurological:     General: No focal deficit present.     Mental Status: He is alert and oriented to person, place, and time.     Cranial Nerves: No cranial nerve deficit.     Sensory: No sensory deficit.     Motor: No weakness.     Coordination: Coordination normal.  Psychiatric:        Mood and Affect: Mood normal.      UC Treatments / Results  Labs (all labs ordered are listed, but only abnormal results are displayed) Labs Reviewed  GLUCOSE, CAPILLARY - Abnormal; Notable for the following components:      Result Value   Glucose-Capillary 172 (*)    All other components within normal limits  POCT URINALYSIS DIP (DEVICE) - Abnormal; Notable for the following components:   Hgb urine dipstick TRACE (*)    Protein, ur 30 (*)    Leukocytes,Ua SMALL (*)    All other components within normal limits  URINE CULTURE  CBG MONITORING, ED    EKG None  Radiology No results found.  Procedures Procedures (including critical care time)  Medications Ordered in UC Medications - No data to display  Initial Impression / Assessment and Plan / UC Course  I have reviewed the triage vital signs and the nursing notes.  Pertinent labs & imaging results that were available during my care of the patient were reviewed by me and considered in my medical decision making (see chart for details).     Pt is a 85 year old male that presents with episode of dizziness that has resolved.  Most likely this was due to hypoglycemic  episode. He believes that he took too much insulin, which dropped his blood sugar. He then drank OJ and felt better. He has been fine since.  Not likely his symptoms are related to the colchicine.  He has no other concerning signs or symptoms.  VSS and he is non toxic or ill appearing.  No focal neuro deficits. No concern for CVA EKG normal sinus rhythm and no concern for ACS.  Urine with trace leuks and blood. We will go ahead and treat for UTI pending culture. This could potentially be contributing to the symptoms.  Instructed to stay hydrated.  Pt understanding and agrees that if symptoms return he will need to go to the ER.  Otherwise he can follow up with his doctor.     Final Clinical Impressions(s) / UC Diagnoses   Final diagnoses:  Dizziness  Urinary tract infection with hematuria, site unspecified     Discharge Instructions     Your EKG was normal Your blood sugar was 172 You may have a UTI based on your urine We will go ahead and treat this with antibiotics. Make sure that you are drinking plenty of fluids and staying hydrated.  Be careful not to inject too much insulin.  If you have  another episode please go to the hospital.  Follow up with primary care doctor.        ED Prescriptions    Medication Sig Dispense Auth. Provider   cephALEXin (KEFLEX) 500 MG capsule Take 1 capsule (500 mg total) by mouth 2 (two) times daily. 28 capsule Loura Halt A, NP     Controlled Substance Prescriptions [place] Controlled Substance Registry consulted? Not Applicable   Orvan July, NP 06/11/18 1540

## 2018-06-11 NOTE — Telephone Encounter (Signed)
He is at urg care now

## 2018-06-13 LAB — URINE CULTURE: Culture: >=100000 — AB

## 2018-06-14 ENCOUNTER — Telehealth (HOSPITAL_COMMUNITY): Payer: Self-pay | Admitting: Emergency Medicine

## 2018-06-14 NOTE — Telephone Encounter (Signed)
Urine culture was positive for PROTEUS MIRABILIS and was given  keflex at urgent care visit. Pt contacted and made aware, educated on completing antibiotic and to follow up if symptoms are persistent. Verbalized understanding.

## 2018-06-16 ENCOUNTER — Other Ambulatory Visit: Payer: Self-pay

## 2018-06-16 ENCOUNTER — Ambulatory Visit (INDEPENDENT_AMBULATORY_CARE_PROVIDER_SITE_OTHER): Payer: Medicare Other | Admitting: Internal Medicine

## 2018-06-16 ENCOUNTER — Other Ambulatory Visit: Payer: Self-pay | Admitting: Internal Medicine

## 2018-06-16 DIAGNOSIS — Z794 Long term (current) use of insulin: Secondary | ICD-10-CM

## 2018-06-16 DIAGNOSIS — M109 Gout, unspecified: Secondary | ICD-10-CM | POA: Diagnosis not present

## 2018-06-16 DIAGNOSIS — Z79899 Other long term (current) drug therapy: Secondary | ICD-10-CM

## 2018-06-16 DIAGNOSIS — E1151 Type 2 diabetes mellitus with diabetic peripheral angiopathy without gangrene: Secondary | ICD-10-CM

## 2018-06-16 DIAGNOSIS — I5022 Chronic systolic (congestive) heart failure: Secondary | ICD-10-CM | POA: Diagnosis not present

## 2018-06-16 DIAGNOSIS — I739 Peripheral vascular disease, unspecified: Secondary | ICD-10-CM

## 2018-06-16 DIAGNOSIS — E1129 Type 2 diabetes mellitus with other diabetic kidney complication: Secondary | ICD-10-CM

## 2018-06-16 DIAGNOSIS — I110 Hypertensive heart disease with heart failure: Secondary | ICD-10-CM

## 2018-06-16 DIAGNOSIS — K219 Gastro-esophageal reflux disease without esophagitis: Secondary | ICD-10-CM

## 2018-06-16 DIAGNOSIS — R809 Proteinuria, unspecified: Secondary | ICD-10-CM

## 2018-06-16 DIAGNOSIS — Z7982 Long term (current) use of aspirin: Secondary | ICD-10-CM

## 2018-06-16 NOTE — Progress Notes (Signed)
[facility] Internal Medicine Residency Telephone Encounter Continuity Care Appointment  HPI:   This telephone encounter was created for [name] [name] on 06/16/2018 for the following purpose/cc follow up of HTN, DM2 and HFrEF.  [name] [name] is a pleasant 85 year old man with many chronic medical problems.  He is a new patient to me and he was interested in doing a thorough medication review at this time.   First, we discussed his recent flare of gout.  He notes that the pain and swelling are almost resolved.  He took the colchicine as prescribed.  He would like another course of this medication to help completely resolve the pain and so I will send this in today.    He was recently seen in urgent care for dizzy episodes.  He feels that these are due to low blood sugars.  He has been taking his insulin as prescribed, 20 units BID, but having episodes of severe dizziness and feeling like he was going to pass out.  This last time, he presented to urgent care but his symptoms had all but resolved after drinking orange juice.  He has since dropped his insulin to 10 units BID and these episodes have resolved.  He was given a course of cephalexin at the [facility] for presumed UTI.  He is taking these without issue.    He notes that his PVD is doing fine.  He has not had any issues with chest pain.  The dizziness has resolved.  We did a thorough medication review.  He read off all of his medications to me and he is on the correct medications.  He has no concerns or side effects except as noted above.  The only medication he could not confirm for me was omeprazole, however, he does think he takes it.     Past Medical History:  Past Medical History:  Diagnosis Date  . Benign prostatic hyperplasia (BPH) with urinary urge incontinence 11/13/2017  . Chronic left shoulder pain 11/13/2017  . Chronic non-seasonal allergic rhinitis 02/17/2006  . Chronic systolic heart failure ([place]) 10/31/2016   Echo (05/03/2015):  LVEF 27-06%, grade 2 diastolic dysfunction  . 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 Essential hypertension 11/04/2005  . Gastroesophageal reflux disease with hiatal hernia 11/04/2005  . History of prostate cancer 02/17/2006   Diagnosed 1995, s/p seed implantation in the late 1990's, PSA undetectable 04/10/2016  . Hyperlipidemia 11/04/2005  . Obesity (BMI 30.0-34.9) 11/13/2017  . Peripheral arterial disease ([place]) 09/30/2007   with claudication, failed attempt at LLE PTCA  . Type 2 diabetes mellitus with microalbuminuria, with long-term current use of insulin ([place]) 11/13/2017  . 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 Type 2 diabetes mellitus with peripheral neuropathy ([place]) 11/04/2005  . Urethral stricture in male 11/13/2017   Noted on cystoscopy 12/18/2014.  Reportedly asked to do chronic intermittent self catheterizations, but has not.      ROS:   As per HPI.  Denies recent illness, wife and family are staying at home.  Otherwise negative.    Assessment / Plan / Recommendations:   Please see A&P under problem oriented charting for assessment of the patient's acute and chronic medical conditions.  As always, pt is advised that if symptoms worsen or new symptoms arise, they should go to an urgent care facility or to to ER for further evaluation.   Consent and Medical Decision Making:   This is a telephone encounter between [facility] and [name] [name] on 06/16/2018 for Follow up of chronic medical conditions. The visit was conducted with the patient located at home and [name] [name] at [facility]. The patient's identity was confirmed using  their DOB and current address. The patient has consented to being evaluated through a telephone encounter and understands the associated risks (an examination cannot be done and the patient may need to come in for an appointment) / benefits (allows the patient to remain at home, decreasing exposure to coronavirus). I personally spent 25 minutes on medical discussion.    Follow up 1-2 months in person for A1C.

## 2018-06-18 NOTE — Assessment & Plan Note (Signed)
Almost resolved.  Avoid NSAIDs given GERD.  Avoid prednisone given DM.  Will give another course of colchicine today and assess at next visit.

## 2018-06-18 NOTE — Assessment & Plan Note (Signed)
Last TTE from 2017 showed EF of 45%.  He was mildly winded during our conversation.  Follow up in person at next visit, assess weight and clinical HF status.

## 2018-06-18 NOTE — Assessment & Plan Note (Signed)
He notes that he has been having episodes of hypoglycemia that resolve with juice.  He has self decreased his insulin to 10 units BID due to this.  He is also taking metformin.  Last A1C was 9.2, but given his age and hypoglycemic episodes, this may be appropriate.  He will need a repeat A1C in 1-2 months.   Plan Decrease NPH to 10 units BID Continue metformin He will call the clinic if any further dizzy episodes occur.

## 2018-06-18 NOTE — Patient Instructions (Signed)
Instructions given verbally.   

## 2018-06-18 NOTE — Assessment & Plan Note (Signed)
He reports that his leg pain is doing well. He does not have significant pain to report today.  He is taking aspirin and Cilostazol without side effects per him.   Plan Continue cilostazol and aspirin

## 2018-06-22 ENCOUNTER — Other Ambulatory Visit: Payer: Self-pay | Admitting: Endocrinology

## 2018-06-22 ENCOUNTER — Other Ambulatory Visit: Payer: Self-pay

## 2018-06-22 MED ORDER — LOSARTAN POTASSIUM 50 MG PO TABS: 50.0000 mg | ORAL_TABLET | Freq: Every day | ORAL | 0 refills | Status: DC

## 2018-06-24 ENCOUNTER — Telehealth: Payer: Self-pay | Admitting: Cardiovascular Disease

## 2018-06-24 NOTE — Telephone Encounter (Signed)
Per pt call has a que4sion abut his mediactions,  He has been getting headachs lately.    Pt c/o medication issue:  1. Name of Medication: all  2. How are you currently taking this medication (dosage and times per day)?  1daily   3. Are you having a reaction (difficulty breathing--STAT)? no  4. What is your medication issue? Headaches lately.

## 2018-06-24 NOTE — Telephone Encounter (Signed)
Spoke with patient who states he thinks losartan is causing him to have a headache. He states he gets a headache every time he takes losartan. He also asks what dose of aspirin he is supposed to take and I advised him to take aspirin 81 mg. He states he bought a bottle of aspirin 325 mg and I advised him these would be too much for him. He states he will get another bottle. I asked about water consumption and he states he drinks 2 glasses of water daily. I advised him to increase water to 8 glasses of water daily and to call back if his headaches do not improve. He verbalized understanding and agreement and thanked me for the call.

## 2018-06-25 ENCOUNTER — Ambulatory Visit: Payer: Medicare Other | Admitting: Internal Medicine

## 2018-07-09 ENCOUNTER — Other Ambulatory Visit: Payer: Self-pay | Admitting: Internal Medicine

## 2018-07-09 DIAGNOSIS — I739 Peripheral vascular disease, unspecified: Secondary | ICD-10-CM

## 2018-07-09 MED ORDER — OMEPRAZOLE 20 MG PO CPDR: 20.0000 mg | DELAYED_RELEASE_CAPSULE | Freq: Every day | ORAL | 1 refills | Status: DC

## 2018-07-09 MED ORDER — GABAPENTIN 100 MG PO CAPS: 100.0000 mg | ORAL_CAPSULE | Freq: Three times a day (TID) | ORAL | 5 refills | Status: DC

## 2018-07-09 MED ORDER — CILOSTAZOL 50 MG PO TABS: 50.0000 mg | ORAL_TABLET | Freq: Two times a day (BID) | ORAL | 1 refills | Status: DC

## 2018-07-09 NOTE — Telephone Encounter (Signed)
Pt is completely of medicine, pls try to get them to the pharmacy ASAP

## 2018-07-09 NOTE — Telephone Encounter (Signed)
Needs refill on   cilostazol (PLETAL) 50 MG tablet   gabapentin (NEURONTIN) 100 MG capsule omeprazole (PRILOSEC) 20 MG capsule    ;pt contact [place], [place]

## 2018-07-27 ENCOUNTER — Other Ambulatory Visit: Payer: Self-pay | Admitting: Endocrinology

## 2018-07-27 NOTE — Telephone Encounter (Signed)
Please have him make follow-up appointment with labs first, will discuss changing metformin on his visit

## 2018-07-27 NOTE — Telephone Encounter (Signed)
Last OV 10/19 refill or refuse please advise

## 2018-07-28 ENCOUNTER — Other Ambulatory Visit: Payer: Self-pay

## 2018-07-28 ENCOUNTER — Other Ambulatory Visit (INDEPENDENT_AMBULATORY_CARE_PROVIDER_SITE_OTHER): Payer: Medicare Other

## 2018-07-28 ENCOUNTER — Other Ambulatory Visit: Payer: Self-pay | Admitting: Endocrinology

## 2018-07-28 DIAGNOSIS — Z794 Long term (current) use of insulin: Secondary | ICD-10-CM

## 2018-07-28 DIAGNOSIS — E1165 Type 2 diabetes mellitus with hyperglycemia: Secondary | ICD-10-CM

## 2018-07-28 DIAGNOSIS — E1129 Type 2 diabetes mellitus with other diabetic kidney complication: Secondary | ICD-10-CM | POA: Diagnosis not present

## 2018-07-28 DIAGNOSIS — R809 Proteinuria, unspecified: Secondary | ICD-10-CM

## 2018-07-29 LAB — COMPREHENSIVE METABOLIC PANEL
ALT: 18 U/L (ref 0–53)
AST: 21 U/L (ref 0–37)
Albumin: 4.1 g/dL (ref 3.5–5.2)
Alkaline Phosphatase: 91 U/L (ref 39–117)
BUN: 25 mg/dL — ABNORMAL HIGH (ref 6–23)
CO2: 24 mEq/L (ref 19–32)
Calcium: 9.3 mg/dL (ref 8.4–10.5)
Chloride: 108 mEq/L (ref 96–112)
Creatinine, Ser: 1.34 mg/dL (ref 0.40–1.50)
GFR: 61.33 mL/min (ref >60.00)
Glucose, Bld: 189 mg/dL — ABNORMAL HIGH (ref 70–99)
Potassium: 4 mEq/L (ref 3.5–5.1)
Sodium: 141 mEq/L (ref 135–145)
Total Bilirubin: 0.4 mg/dL (ref 0.2–1.2)
Total Protein: 7.8 g/dL (ref 6.0–8.3)

## 2018-07-29 LAB — HEMOGLOBIN A1C: Hgb A1c MFr Bld: 7.8 % — ABNORMAL HIGH (ref 4.6–6.5)

## 2018-08-02 NOTE — Progress Notes (Signed)
Patient ID: [name] [name], male   DOB: 01/24/1932, 85 y.o.   MRN: [id number]   Reason for Appointment: diabetes follow-up  History of Present Illness   Today's office visit was provided via telemedicine using a telephone call to the patient Patient has been explained the limitations of evaluation and management by telemedicine and the availability of in person appointments.  The patient understood the limitations and agreed to proceed. Patient also understood that the telehealth visit is billable. . Location of the patient: Home . Location of the provider: Office Only the patient and myself were participating in the encounter  DIABETES type II:  Diagnosis date: 1990  Previous history: He has been on insulin since 2005 Previously on Lantus and also subsequently basal bolus regimen but this was changed to NPH and regular insulin because of cost his A1c has been usually near 7% in the past He was switched from Lantus to NPH because of cost  Recent history:   INSULIN REGIMEN:    NOVOLIN-N -Relion 20 units before breakfast and 20 units supper Relion/ Regular Insulin 10 units twice daily before breakfast and dinner  His last visit was in 11/2017  Current blood sugar patterns and problems identified:   He again appears to be taking his insulin doses arbitrarily despite instructions on each visit  Previously was taking 10 units of NPH in the evening and now is taking 20  He cannot remember to take separate doses of NPH and regular in the evening and takes both the doses at dinnertime  Also for simplicity takes the same doses at breakfast and dinnertime did follow instructions for changing his NPH insulin to twice a day instead of 3 times a day  Again he is not taking any insulin coverage at lunchtime to cover his lunch meal which usually has carbohydrate  With this his blood sugars have been as high as 259 at dinnertime although some days they are as low as 137   However he does not think he has weekly symptoms of hypoglycemia  His lowest reading was 64 fasting about 2 days ago  As before he forgets to check his readings after meals especially after supper  Not able to do much exercise because of leg pains and claudication  He is asking about changing his metformin ER which is being recalled     Oral hypoglycemic drugs: Metformin ER 500 mg -- 2 in p.m.        Side effects from medications: None  Proper timing of medications in relation to meals:  usually.          Monitors blood glucose: <1 times a day.    Glucometer:   One Touch Verio        Blood Glucose readings from download:   PRE-MEAL Fasting Lunch Dinner Bedtime Overall  Glucose range:  61-231   65-289    Mean/median:  115   202  141   POST-MEAL PC Breakfast PC Lunch PC Dinner  Glucose range:    95-324  Mean/median:    204       Meals: 2-3 meals per day.  breakfast is eggs,oatmeal usually.   Breakfast 7 AM, lunch 1 PM and dinner 6-7                 Dietician visit: Most recent:  02/2017     Weight control:   Wt Readings from Last 3 Encounters:  06/02/18 265 lb (120.2 kg)  05/25/18 258 lb (117  kg)  03/26/18 264 lb 8 oz (120 kg)           Diabetes labs:  Lab Results  Component Value Date   HGBA1C 7.8 (H) 07/28/2018   HGBA1C 9.2 (A) 02/26/2018   HGBA1C 7.3 (H) 11/06/2017   Lab Results  Component Value Date   MICROALBUR 21.0 (H) 06/05/2017   LDLCALC 47 11/06/2017   CREATININE 1.34 07/28/2018     Allergies as of 08/03/2018   No Known Allergies     Medication List       Accurate as of August 02, 2018  9:27 PM. If you have any questions, ask your nurse or doctor.        amLODipine 10 MG tablet Commonly known as: NORVASC Take 1 tablet (10 mg total) by mouth daily.   aspirin EC 81 MG tablet Take 1 tablet (81 mg total) by mouth daily.   atorvastatin 40 MG tablet Commonly known as: LIPITOR TAKE 1 TABLET BY MOUTH ONCE DAILY   cephALEXin 500 MG capsule  Commonly known as: KEFLEX Take 1 capsule (500 mg total) by mouth 2 (two) times daily.   cilostazol 50 MG tablet Commonly known as: PLETAL Take 1 tablet (50 mg total) by mouth 2 (two) times daily.   colchicine 0.6 MG tablet Take 1 tablet by mouth twice daily for 14 days   furosemide 20 MG tablet Commonly known as: LASIX Take 1 tablet (20 mg total) by mouth daily.   gabapentin 100 MG capsule Commonly known as: NEURONTIN Take 1 capsule (100 mg total) by mouth 3 (three) times daily.   glucose blood test strip Commonly known as: OneTouch Verio USE AS INSTRUCTED TO CHECK BLOOD SUGAR TWICE DAILY   insulin NPH Human 100 UNIT/ML injection Commonly known as: NOVOLIN N Inject 20 Units into the skin 2 (two) times daily.   Insulin Syringe-Needle U-100 31G X 15/64" 0.3 ML Misc Use to inject insulin 4 times a day   loratadine 10 MG tablet Commonly known as: Allergy Relief Take 1 tablet (10 mg total) by mouth daily as needed for rhinitis.   losartan 50 MG tablet Commonly known as: COZAAR Take 1 tablet (50 mg total) by mouth daily.   metFORMIN 500 MG 24 hr tablet Commonly known as: GLUCOPHAGE-XR TAKE 2 TABLETS BY MOUTH ONCE DAILY WITH SUPPER   metoprolol tartrate 25 MG tablet Commonly known as: LOPRESSOR Take 1 tablet (25 mg total) by mouth 2 (two) times daily.   NOVOLIN R RELION IJ Inject 15 Units as directed 2 (two) times daily before a meal.   omeprazole 20 MG capsule Commonly known as: PRILOSEC Take 1 capsule (20 mg total) by mouth daily.   OneTouch Delica Lancets Fine Misc Use to check blood sugar 2 times per day dx code E11.49   oxybutynin 5 MG tablet Commonly known as: DITROPAN Take 1 tablet (5 mg total) by mouth 3 (three) times daily.   tamsulosin 0.4 MG Caps capsule Commonly known as: FLOMAX Take 1 capsule (0.4 mg total) by mouth daily.       Allergies: No Known Allergies  Past Medical History:  Diagnosis Date  . Benign prostatic hyperplasia (BPH) with  urinary urge incontinence 11/13/2017  . Chronic left shoulder pain 11/13/2017  . Chronic non-seasonal allergic rhinitis 02/17/2006  . Chronic systolic heart failure ([place]) 10/31/2016   Echo (05/03/2015): LVEF 51-02%, grade 2 diastolic dysfunction  . Coronary artery disease involving native coronary artery of native heart without angina pectoris 09/30/2007   s/p  CABG on 01/05/2013: left internal mammary artery to left anterior descending, saphenous vein graft to obtuse marginal 1, sequential saphenous vein graft to acute marginal and posterior descending  . Essential hypertension 11/04/2005  . Gastroesophageal reflux disease with hiatal hernia 11/04/2005  . History of prostate cancer 02/17/2006   Diagnosed 1995, s/p seed implantation in the late 1990's, PSA undetectable 04/10/2016  . Hyperlipidemia 11/04/2005  . Obesity (BMI 30.0-34.9) 11/13/2017  . Peripheral arterial disease ([place]) 09/30/2007   with claudication, failed attempt at LLE PTCA  . Type 2 diabetes mellitus with microalbuminuria, with long-term current use of insulin ([place]) 11/13/2017  . Type 2 diabetes mellitus with mild 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, bilateral ([place]) 03/24/2018  . Type 2 diabetes mellitus with peripheral neuropathy ([place]) 11/04/2005  . Urethral stricture in male 11/13/2017   Noted on cystoscopy 12/18/2014.  Reportedly asked to do chronic intermittent self catheterizations, but has not.    Past Surgical History:  Procedure Laterality Date  . CARDIAC CATHETERIZATION  10/05/08   REVEALS HYPOKINESIS OF THE LATERAL WALL AND EF 50-55%  . CORONARY ANGIOPLASTY WITH STENT PLACEMENT    . CORONARY ARTERY BYPASS GRAFT N/A 01/05/2013   Procedure: CORONARY ARTERY BYPASS GRAFTING (CABG);  Surgeon: Melrose Nakayama, MD;  Location: [facility];  Service: Open Heart Surgery;  Laterality: N/A;  . INTRAOPERATIVE TRANSESOPHAGEAL ECHOCARDIOGRAM N/A 01/05/2013   Procedure: INTRAOPERATIVE TRANSESOPHAGEAL ECHOCARDIOGRAM;   Surgeon: Melrose Nakayama, MD;  Location: [facility];  Service: Open Heart Surgery;  Laterality: N/A;  . LEFT HEART CATHETERIZATION WITH CORONARY ANGIOGRAM N/A 01/03/2013   Procedure: LEFT HEART CATHETERIZATION WITH CORONARY ANGIOGRAM;  Surgeon: Lorretta Harp, MD;  Location[facility] LAB;  Service: Cardiovascular;  Laterality: N/A;  . LOWER EXTREMITY ANGIOGRAM Right 12/07/2012   unsuccessful attempt at percutaneous revascularization of a calcified long segment chronic total occlusion mid left SFA /notes 12/07/2012  . LOWER EXTREMITY ANGIOGRAM N/A 12/07/2012   Procedure: LOWER EXTREMITY ANGIOGRAM;  Surgeon: Lorretta Harp, MD;  Location: [facility] CATH LAB;  Service: Cardiovascular;  Laterality: N/A;  . PROSTATE SURGERY  1990's    Family History  Problem Relation Age of Onset  . Kidney failure Mother   . Hypertension Father   . Stroke Father   . Other Sister   . Other Brother   . Other Brother   . Other Brother   . Other Brother   . Other Brother   . Other Brother   . Other Brother   . Other Brother   . Healthy Daughter   . Healthy Daughter   . Healthy Daughter   . Healthy Son     Social History:  reports that he quit smoking about 38 years ago. His smoking use included cigarettes. He has a 15.00 pack-year smoking history. He has never used smokeless tobacco. He reports that he does not drink alcohol or use drugs.  Review of Systems:   Hypertension:  has had long-standing hypertension followed by his PCP and other physicians Last BP was done in the emergency room  BP Readings from Last 3 Encounters:  06/11/18 (!) 148/95  03/26/18 130/62  02/26/18 129/66     Lipids: Has been treated with Lipitor 40 mg Followed by cardiologist   Lab Results  Component Value Date   CHOL 98 11/06/2017   HDL 31.60 (L) 11/06/2017   LDLCALC 47 11/06/2017   TRIG 99.0 11/06/2017   CHOLHDL 3 11/06/2017    Neuropathy: Has been given gabapentin 100 mg by his  PCP   Also has pain in his lower  legs when he is walking, especially on the left calf     Foot exam done in 03/2017, has absent pedal pulses otherwise normal monofilament sensation   RENAL dysfunction: Etiology not clear, does not have microalbuminuria  Lab Results  Component Value Date   CREATININE 1.34 07/28/2018   CREATININE 1.36 (H) 06/01/2018   CREATININE 1.54 (H) 02/26/2018       LABS:  Lab on 07/28/2018  Component Date Value Ref Range Status  . Hgb A1c MFr Bld 07/28/2018 7.8* 4.6 - 6.5 % Final   Glycemic Control Guidelines for People with Diabetes:Non Diabetic:  <6%Goal of Therapy: <7%Additional Action Suggested:  >8%   . Sodium 07/28/2018 141  135 - 145 mEq/L Final  . Potassium 07/28/2018 4.0  3.5 - 5.1 mEq/L Final  . Chloride 07/28/2018 108  96 - 112 mEq/L Final  . CO2 07/28/2018 24  19 - 32 mEq/L Final  . Glucose, Bld 07/28/2018 189* 70 - 99 mg/dL Final  . BUN 07/28/2018 25* 6 - 23 mg/dL Final  . Creatinine, Ser 07/28/2018 1.34  0.40 - 1.50 mg/dL Final  . Total Bilirubin 07/28/2018 0.4  0.2 - 1.2 mg/dL Final  . Alkaline Phosphatase 07/28/2018 91  39 - 117 U/L Final  . AST 07/28/2018 21  0 - 37 U/L Final  . ALT 07/28/2018 18  0 - 53 U/L Final  . Total Protein 07/28/2018 7.8  6.0 - 8.3 g/dL Final  . Albumin 07/28/2018 4.1  3.5 - 5.2 g/dL Final  . Calcium 07/28/2018 9.3  8.4 - 10.5 mg/dL Final  . GFR 07/28/2018 61.33  >60.00 mL/min Final     Examination:   There were no vitals taken for this visit.  There is no height or weight on file to calculate BMI.     ASSESSMENT/ PLAN:     Diabetes type 2 insulin-dependent:   His A1c is 7.8   See history of present illness for detailed discussion of current diabetes management, blood sugar patterns and problems identified  Considering his age his blood sugar control is reasonably good Since he does not check his readings after meals difficult to adjust his mealtime dose However his blood sugars before dinnertime are quite variable likely based on  his diet at lunchtime, snacks as well as likely variable action of his NPH and regular insulin  He has again changed his insulin arbitrarily and takes different doses than what is prescribed However his blood sugar levels are generally not much different regardless of how much insulin he takes As before his blood sugars in the mornings are slightly lower and discussed the possibility of low sugars in the morning especially with taking evening NPH at suppertime  He has difficulty understanding significant changes and likes simple instructions  Discussed day-to-day management of diet, insulin, timing and doses of insulin Instead of 20 units he will take 15 units of NPH at suppertime along with 15 of regular Continue 20 NPH with 15 regular at dinnertime Reminded him to take his insulin 30-minute before eating if possible Although he should be doing some readings midday and after supper he may not remember to do this, this was discussed again Avoid large snacks in the afternoon  Lipids: Followed by other physicians but needs follow-up labs  HYPERTENSION: Blood pressure to be rechecked  Telephone encounter duration =8 minutes    There are no Patient Instructions on file for this visit.  Elayne Snare 08/02/2018, 9:27 PM

## 2018-08-03 ENCOUNTER — Ambulatory Visit (INDEPENDENT_AMBULATORY_CARE_PROVIDER_SITE_OTHER): Payer: Medicare Other | Admitting: Endocrinology

## 2018-08-03 ENCOUNTER — Other Ambulatory Visit: Payer: Self-pay

## 2018-08-03 DIAGNOSIS — Z794 Long term (current) use of insulin: Secondary | ICD-10-CM

## 2018-08-03 DIAGNOSIS — E1142 Type 2 diabetes mellitus with diabetic polyneuropathy: Secondary | ICD-10-CM

## 2018-08-03 DIAGNOSIS — E1129 Type 2 diabetes mellitus with other diabetic kidney complication: Secondary | ICD-10-CM | POA: Diagnosis not present

## 2018-08-03 DIAGNOSIS — R809 Proteinuria, unspecified: Secondary | ICD-10-CM

## 2018-08-10 ENCOUNTER — Encounter: Payer: Self-pay | Admitting: *Deleted

## 2018-08-12 ENCOUNTER — Other Ambulatory Visit: Payer: Self-pay | Admitting: *Deleted

## 2018-08-12 ENCOUNTER — Telehealth: Payer: Self-pay | Admitting: Internal Medicine

## 2018-08-12 DIAGNOSIS — M109 Gout, unspecified: Secondary | ICD-10-CM

## 2018-08-12 MED ORDER — COLCHICINE 0.6 MG PO TABS: 0.6000 mg | ORAL_TABLET | Freq: Two times a day (BID) | ORAL | 0 refills | Status: DC

## 2018-08-12 NOTE — Telephone Encounter (Signed)
Filled colchicine

## 2018-08-12 NOTE — Telephone Encounter (Signed)
Pt is requesting nurse call; he is wanting to get some medicine for his gout [phone]

## 2018-08-12 NOTE — Telephone Encounter (Signed)
Pt calling back to speak with a nurse about getting med for gout. Please call pt back.

## 2018-08-12 NOTE — Telephone Encounter (Signed)
I called and spoke to pt a few hours ago

## 2018-08-13 ENCOUNTER — Other Ambulatory Visit: Payer: Self-pay | Admitting: Endocrinology

## 2018-08-20 ENCOUNTER — Ambulatory Visit: Payer: Medicare Other | Admitting: Internal Medicine

## 2018-08-24 ENCOUNTER — Encounter: Payer: Self-pay | Admitting: *Deleted

## 2018-09-06 ENCOUNTER — Ambulatory Visit: Payer: Medicare Other | Admitting: Internal Medicine

## 2018-09-06 ENCOUNTER — Other Ambulatory Visit: Payer: Self-pay

## 2018-09-06 ENCOUNTER — Encounter (INDEPENDENT_AMBULATORY_CARE_PROVIDER_SITE_OTHER): Payer: Self-pay

## 2018-09-06 DIAGNOSIS — M109 Gout, unspecified: Secondary | ICD-10-CM | POA: Diagnosis not present

## 2018-09-06 DIAGNOSIS — Z79899 Other long term (current) drug therapy: Secondary | ICD-10-CM

## 2018-09-06 DIAGNOSIS — E1122 Type 2 diabetes mellitus with diabetic chronic kidney disease: Secondary | ICD-10-CM

## 2018-09-06 DIAGNOSIS — N189 Chronic kidney disease, unspecified: Secondary | ICD-10-CM | POA: Diagnosis not present

## 2018-09-06 MED ORDER — COLCHICINE 0.6 MG PO TABS: 0.6000 mg | ORAL_TABLET | Freq: Two times a day (BID) | ORAL | 0 refills | Status: DC

## 2018-09-06 NOTE — Assessment & Plan Note (Signed)
Left great toe pain: Mr. Gutzmer was evaluated at the clinic in April 2020 after he complained of pain in his left foot.  His symptoms and physical exams were consistent with an acute gout flare.  He was started on colchicine and reported improvement.  He called the clinic again on August 12, 2018 complaining of pain that was worse in his left great toe.  He was given a refill of colchicine and he also self treated by soaking his foot in salt water and alcohol which he states significantly improved his symptoms.  Today, he complains of moderate amount of pain in his left foot however worse in the left great toe.  Physical exams does not reveal erythema or swelling of the left great toe.  The left foot is not warm to touch and is moderately tender to palpation.  Plan: -Start 1 week course of colchicine 0.6 mg BID (renal clearance okay from last BMP) -Return to clinic in 2 weeks -We will check uric acid at next clinic visit and after acute gout flare, will start allopurinol - Avoid prednisone due to diabetes mellitus and NSAIDs due to CKD

## 2018-09-06 NOTE — Progress Notes (Signed)
   CC: Left great toe pain  HPI:  [name] is a 85 y.o. with medical history listed below presenting for evaluation of left great toe pain.  Please see problem based charting for further details.  Past Medical History:  Diagnosis Date  . Benign prostatic hyperplasia (BPH) with urinary urge incontinence 11/13/2017  . Chronic left shoulder pain 11/13/2017  . Chronic non-seasonal allergic rhinitis 02/17/2006  . Chronic systolic heart failure ([place]) 10/31/2016   Echo (05/03/2015): LVEF 32-91%, grade 2 diastolic dysfunction  . 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 Essential hypertension 11/04/2005  . Gastroesophageal reflux disease with hiatal hernia 11/04/2005  . History of prostate cancer 02/17/2006   Diagnosed 1995, s/p seed implantation in the late 1990's, PSA undetectable 04/10/2016  . Hyperlipidemia 11/04/2005  . Obesity (BMI 30.0-34.9) 11/13/2017  . Peripheral arterial disease ([place]) 09/30/2007   with claudication, failed attempt at LLE PTCA  . Type 2 diabetes mellitus with microalbuminuria, with long-term current use of insulin ([place]) 11/13/2017  . Type 2 diabetes mellitus with mild 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, bilateral ([place]) 03/24/2018  . Type 2 diabetes mellitus with peripheral neuropathy ([place]) 11/04/2005  . Urethral stricture in male 11/13/2017   Noted on cystoscopy 12/18/2014.  Reportedly asked to do chronic intermittent self catheterizations, but has not.   Review of Systems: As per HPI  Physical Exam:  Vitals:   09/06/18 1016  BP: (!) 146/70  Pulse: 67  Temp: 97.9 F (36.6 C)  TempSrc: Oral  SpO2: 100%  Weight: 275 lb 9.6 oz (125 kg)  Height: 6\' 1"  (1.854 m)    Physical Exam Constitutional:      General: Distressed: Minimally distressed due to L. great toe pain.  Musculoskeletal:        General: Tenderness (Left great toe minimally TTP) present. No swelling, deformity or signs of injury.     Right lower leg: No edema.     Left lower leg: No edema.  Skin:    General: Skin is dry.     Findings: No erythema, lesion or rash.  Neurological:     Mental Status: He is alert.     Assessment & Plan:   See Encounters Tab for problem based charting.  Patient discussed with Dr. Dareen Piano

## 2018-09-06 NOTE — Patient Instructions (Signed)
[name] [name],  It was a pleasure taking care of your the clinic today.  I am sorry to hear about your ongoing pain in the left toe.  This seems to be a flareup of your gout.  I will give you a one-week course of colchicine 0.6 mg and you take 1 pill twice a day.  I would like for you to follow-up in the clinic in 2 weeks.  When you arrive, we will check blood work and start you on a medication called allopurinol.  Take care! Dr. Eileen Stanford  Please call the internal medicine center clinic if you have any questions or concerns, we may be able to help and keep you from a long and expensive emergency room wait. Our clinic and after hours phone number is [phone], the best time to call is Monday through Friday 9 am to 4 pm but there is always someone available 24/7 if you have an emergency. If you need medication refills please notify your pharmacy one week in advance and they will send [place] a request.

## 2018-09-07 NOTE — Progress Notes (Signed)
Internal Medicine Clinic Attending  Case discussed with Dr. Agyei at the time of the visit.  We reviewed the resident's history and exam and pertinent patient test results.  I agree with the assessment, diagnosis, and plan of care documented in the resident's note.    

## 2018-09-10 ENCOUNTER — Other Ambulatory Visit: Payer: Self-pay | Admitting: Endocrinology

## 2018-09-20 ENCOUNTER — Ambulatory Visit: Payer: Medicare Other

## 2018-09-20 ENCOUNTER — Other Ambulatory Visit: Payer: Self-pay

## 2018-09-20 ENCOUNTER — Ambulatory Visit (INDEPENDENT_AMBULATORY_CARE_PROVIDER_SITE_OTHER): Payer: Medicare Other | Admitting: Internal Medicine

## 2018-09-20 VITALS — BP 142/72 | HR 59 | Temp 97.9°F | Ht 73.0 in | Wt 275.7 lb

## 2018-09-20 DIAGNOSIS — L03031 Cellulitis of right toe: Secondary | ICD-10-CM | POA: Diagnosis not present

## 2018-09-20 DIAGNOSIS — Z79899 Other long term (current) drug therapy: Secondary | ICD-10-CM | POA: Diagnosis not present

## 2018-09-20 DIAGNOSIS — M109 Gout, unspecified: Secondary | ICD-10-CM

## 2018-09-20 DIAGNOSIS — E1140 Type 2 diabetes mellitus with diabetic neuropathy, unspecified: Secondary | ICD-10-CM

## 2018-09-20 MED ORDER — ALLOPURINOL 100 MG PO TABS: 100.0000 mg | ORAL_TABLET | Freq: Every day | ORAL | 3 refills | Status: DC

## 2018-09-20 MED ORDER — COLCHICINE 0.6 MG PO TABS: 0.6000 mg | ORAL_TABLET | Freq: Two times a day (BID) | ORAL | 0 refills | Status: DC

## 2018-09-20 NOTE — Assessment & Plan Note (Signed)
Gouty arthritis: Mr. Thang was first evaluated at the clinic in April 2020 with left toe pain that was thought to be secondary to acute gouty arthritis.  He was started on colchicine 0.6 mg twice daily.  The pain never completely resolved and he came to the clinic a month after his initial visit.  I personally evaluated him several weeks ago and he had continued pain for which I extended his colchicine 0.6 mg twice daily for an additional week.  Today, he states his pain is doing extremely better and is only complaining of neuropathic secondary to diabetes.  On physical exams, he has no swelling or erythema of the great toes.  Plan: -Obtain uric acid today, if elevated then Start allopurinol 100 mg daily -Continue maintenance colchicine 0.6 mg twice daily -Follow-up clinic in 2 to 4 weeks for serial uric acid and titrate allopurinol as needed until uric acid is below 6

## 2018-09-20 NOTE — Patient Instructions (Addendum)
[name],   It was a pleasure taking care of you at the clinic today.  I am glad to hear that your toe pain has improved.  He has what I would recommend after our visit today: Next  1.  I am ordering blood work to check on your gout 2.  I will start you on a gout medication once your lab results come back  3.  For your nerve pain, as we discussed you can increase your nighttime gabapentin to 200 mg and continue taking 100 mg in the morning and afternoon.  Take care! Dr. Eileen Stanford  Please call the internal medicine center clinic if you have any questions or concerns, we may be able to help and keep you from a long and expensive emergency room wait. Our clinic and after hours phone number is [phone], the best time to call is Monday through Friday 9 am to 4 pm but there is always someone available 24/7 if you have an emergency. If you need medication refills please notify your pharmacy one week in advance and they will send [place] a request.

## 2018-09-20 NOTE — Progress Notes (Signed)
   CC: Follow-up gout of left great toe  HPI:  [name] [name] is a 85 y.o. very pleasant African-American gentleman with medical history listed below presenting to follow-up on gouty arthritis.  Please see problem based charting for further details.   Past Medical History:  Diagnosis Date  . Benign prostatic hyperplasia (BPH) with urinary urge incontinence 11/13/2017  . Chronic left shoulder pain 11/13/2017  . Chronic non-seasonal allergic rhinitis 02/17/2006  . Chronic systolic heart failure ([place]) 10/31/2016   Echo (05/03/2015): LVEF 12-87%, grade 2 diastolic dysfunction  . 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 Essential hypertension 11/04/2005  . Gastroesophageal reflux disease with hiatal hernia 11/04/2005  . History of prostate cancer 02/17/2006   Diagnosed 1995, s/p seed implantation in the late 1990's, PSA undetectable 04/10/2016  . Hyperlipidemia 11/04/2005  . Obesity (BMI 30.0-34.9) 11/13/2017  . Peripheral arterial disease ([place]) 09/30/2007   with claudication, failed attempt at LLE PTCA  . Type 2 diabetes mellitus with microalbuminuria, with long-term current use of insulin ([place]) 11/13/2017  . 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 Type 2 diabetes mellitus with peripheral neuropathy ([place]) 11/04/2005  . Urethral stricture in male 11/13/2017   Noted on cystoscopy 12/18/2014.  Reportedly asked to do chronic intermittent self catheterizations, but has not.   Review of Systems: As per HPI  Physical Exam:  Vitals:   09/20/18 1025  BP: (!) 142/72  Pulse: (!) 59  Temp: 97.9 F (36.6 C)  TempSrc: Oral  SpO2: 100%  Weight:  275 lb 11.2 oz (125.1 kg)  Height: 6\' 1"  (1.854 m)   Physical Exam Constitutional:      General: He is not in acute distress.    Appearance: He is not ill-appearing.  Skin:    General: Skin is dry.       Neurological:     Mental Status: He is alert.     Sensory: No sensory deficit.  Psychiatric:        Mood and Affect: Mood normal.     Assessment & Plan:   See Encounters Tab for problem based charting.  Patient discussed with Dr. Dareen Piano

## 2018-09-21 ENCOUNTER — Other Ambulatory Visit: Payer: Self-pay | Admitting: Cardiovascular Disease

## 2018-09-21 ENCOUNTER — Other Ambulatory Visit: Payer: Self-pay | Admitting: Endocrinology

## 2018-09-21 LAB — URIC ACID: Uric Acid: 7.3 mg/dL (ref 3.7–8.6)

## 2018-09-22 ENCOUNTER — Telehealth: Payer: Self-pay | Admitting: Internal Medicine

## 2018-09-22 ENCOUNTER — Other Ambulatory Visit: Payer: Self-pay | Admitting: Internal Medicine

## 2018-09-22 ENCOUNTER — Other Ambulatory Visit: Payer: Self-pay

## 2018-09-22 ENCOUNTER — Telehealth: Payer: Self-pay | Admitting: Endocrinology

## 2018-09-22 DIAGNOSIS — M109 Gout, unspecified: Secondary | ICD-10-CM

## 2018-09-22 MED ORDER — ALLOPURINOL 100 MG PO TABS: 100.0000 mg | ORAL_TABLET | Freq: Every day | ORAL | 3 refills | Status: DC

## 2018-09-22 MED ORDER — ONETOUCH VERIO FLEX SYSTEM W/DEVICE KIT: PACK | 0 refills | Status: DC

## 2018-09-22 NOTE — Telephone Encounter (Signed)
Rx sent 

## 2018-09-22 NOTE — Telephone Encounter (Signed)
Patient states the one touch meter that Dr.Kumar gave him has missed 10 of his strips. He believes it is broken and needs a new one.  MEDICATION: One Touch Verio  PHARMACY:  [place], [place]  IS THIS A 90 DAY SUPPLY :   IS PATIENT OUT OF MEDICATION:   IF NOT; HOW MUCH IS LEFT:   LAST APPOINTMENT DATE: @8 /18/2020  NEXT APPOINTMENT DATE:@9 /30/2020  DO WE HAVE YOUR PERMISSION TO LEAVE A DETAILED MESSAGE:  OTHER COMMENTS:    **Let patient know to contact pharmacy at the end of the day to make sure medication is ready. **  ** Please notify patient to allow 48-72 hours to process**  **Encourage patient to contact the pharmacy for refills or they can request refills through [facility]**

## 2018-09-22 NOTE — Telephone Encounter (Signed)
Called [name] to discuss laboratory results.  It showed that [name] [name] uric acid was elevated at 7.3 so I initiated therapy with allopurinol 100 mg daily.  He is to follow-up in clinic in 4 weeks to have a repeat uric acid.  Goal is is less than 6.

## 2018-09-23 ENCOUNTER — Other Ambulatory Visit: Payer: Self-pay | Admitting: Endocrinology

## 2018-09-23 NOTE — Progress Notes (Signed)
Internal Medicine Clinic Attending  Case discussed with Dr. Agyei at the time of the visit.  We reviewed the resident's history and exam and pertinent patient test results.  I agree with the assessment, diagnosis, and plan of care documented in the resident's note.    

## 2018-10-01 ENCOUNTER — Ambulatory Visit: Payer: Medicare Other | Admitting: Internal Medicine

## 2018-10-18 ENCOUNTER — Other Ambulatory Visit: Payer: Self-pay

## 2018-10-18 ENCOUNTER — Ambulatory Visit (INDEPENDENT_AMBULATORY_CARE_PROVIDER_SITE_OTHER): Payer: Medicare Other | Admitting: Internal Medicine

## 2018-10-18 ENCOUNTER — Encounter: Payer: Self-pay | Admitting: Internal Medicine

## 2018-10-18 ENCOUNTER — Other Ambulatory Visit (HOSPITAL_COMMUNITY)
Admission: RE | Admit: 2018-10-18 | Discharge: 2018-10-18 | Disposition: A | Discharge status: Home / Self Care | Payer: Medicare Other | Source: Ambulatory Visit | Attending: Internal Medicine | Admitting: Internal Medicine

## 2018-10-18 VITALS — BP 128/65 | HR 65 | Temp 98.3°F | Wt 281.1 lb

## 2018-10-18 DIAGNOSIS — R300 Dysuria: Secondary | ICD-10-CM | POA: Diagnosis not present

## 2018-10-18 DIAGNOSIS — Z79899 Other long term (current) drug therapy: Secondary | ICD-10-CM

## 2018-10-18 DIAGNOSIS — M109 Gout, unspecified: Secondary | ICD-10-CM

## 2018-10-18 DIAGNOSIS — E1151 Type 2 diabetes mellitus with diabetic peripheral angiopathy without gangrene: Secondary | ICD-10-CM

## 2018-10-18 DIAGNOSIS — Z23 Encounter for immunization: Secondary | ICD-10-CM | POA: Diagnosis not present

## 2018-10-18 DIAGNOSIS — E785 Hyperlipidemia, unspecified: Secondary | ICD-10-CM

## 2018-10-18 DIAGNOSIS — I2510 Atherosclerotic heart disease of native coronary artery without angina pectoris: Secondary | ICD-10-CM

## 2018-10-18 DIAGNOSIS — I110 Hypertensive heart disease with heart failure: Secondary | ICD-10-CM

## 2018-10-18 DIAGNOSIS — I5022 Chronic systolic (congestive) heart failure: Secondary | ICD-10-CM | POA: Diagnosis not present

## 2018-10-18 LAB — POCT URINALYSIS DIPSTICK
Bilirubin, UA: NEGATIVE
Glucose, UA: NEGATIVE
Ketones, UA: NEGATIVE
Nitrite, UA: NEGATIVE
Protein, UA: NEGATIVE
Spec Grav, UA: <=1.005 — AB (ref 1.010–1.025)
Urobilinogen, UA: 0.2 E.U./dL
pH, UA: 5.5 (ref 5.0–8.0)

## 2018-10-18 NOTE — Progress Notes (Signed)
   CC: Gout follow up   HPI:  [name] [name] is a 85 y.o. with chronic systolic heart failure, cad, essential htn, pad, diabetes mellitus type 2, hld who presents for follow up of gout. Please see problem based charting for evaluation, assessment, and plan.  Past Medical History:  Diagnosis Date  . Benign prostatic hyperplasia (BPH) with urinary urge incontinence 11/13/2017  . Chronic left shoulder pain 11/13/2017  . Chronic non-seasonal allergic rhinitis 02/17/2006  . Chronic systolic heart failure ([place]) 10/31/2016   Echo (05/03/2015): LVEF [id number], grade 2 diastolic dysfunction  . 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 Essential hypertension 11/04/2005  . Gastroesophageal reflux disease with hiatal hernia 11/04/2005  . History of prostate cancer 02/17/2006   Diagnosed 1995, s/p seed implantation in the late 1990's, PSA undetectable 04/10/2016  . Hyperlipidemia 11/04/2005  . Obesity (BMI 30.0-34.9) 11/13/2017  . Peripheral arterial disease ([place]) 09/30/2007   with claudication, failed attempt at LLE PTCA  . Type 2 diabetes mellitus with microalbuminuria, with long-term current use of insulin ([place]) 11/13/2017  . Type 2 diabetes mellitus with mild 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, bilateral ([place]) 03/24/2018  . Type 2 diabetes mellitus with peripheral neuropathy ([place]) 11/04/2005  . Urethral stricture in male 11/13/2017   Noted on cystoscopy 12/18/2014.  Reportedly asked to do chronic intermittent self catheterizations, but has not.   Review of Systems:    Review of Systems  Constitutional: Negative for chills and fever.  Respiratory: Negative for cough and shortness of breath.   Gastrointestinal:  Positive for heartburn. Negative for abdominal pain, nausea and vomiting.  Neurological: Negative for dizziness and headaches.   Physical Exam:  Vitals:   10/18/18 1027  BP: 128/65  Pulse: 65  Temp: 98.3 F (36.8 C)  TempSrc: Oral  SpO2: 99%  Weight: 281 lb 1.6 oz (127.5 kg)   Physical Exam  Constitutional: He appears well-developed and well-nourished. No distress.  HENT:  Head: Normocephalic and atraumatic.  Eyes: Conjunctivae are normal.  Cardiovascular: Normal rate, regular rhythm and normal heart sounds.  Respiratory: Effort normal and breath sounds normal. No respiratory distress. He has no wheezes.  GI: Soft. Bowel sounds are normal. He exhibits distension. There is no abdominal tenderness.  Musculoskeletal:        General: No tenderness (no tenderness in left mtp) or edema.     Comments: No warmth to touch in left 1st mtp, no joint effusion, no swelling, easily able to move  Neurological: He is alert.  Skin: He is not diaphoretic.  Psychiatric: He has a normal mood and affect. His behavior is normal. Judgment and thought content normal.   Assessment & Plan:   See Encounters Tab for problem based charting.  Patient discussed with Dr. Angelia Mould

## 2018-10-18 NOTE — Patient Instructions (Signed)
It was a pleasure to see you today [name]. Please make the following changes:  -please continue colchicine and allopurinol  -I have checked your uric acid level today and will call you regarding the results  -I have checked your urine -I have also checked hiv    If you have any questions or concerns, please call our clinic at [phone] between 9am-5pm and after hours call [phone] and ask for the internal medicine resident on call. If you feel you are having a medical emergency please call 911.   Thank you, we look forward to help you remain healthy!  [name] Mage, MD Internal Medicine PGY3

## 2018-10-18 NOTE — Assessment & Plan Note (Signed)
Patient was requested by his pcp Dr. Eileen Stanford to follow up in 4 weeks to re-assess uric acid level. He is currently on allopurinol 100mg  qd and colchicine 0.6mg  bid. He usually has his gout flares in his left toe. Patient states that he has not had a flare since his last visit.   Assessment and plan  The patient has been adherent to his allopurinol and colchicine. Will check uric acid level during this visit. Uric acid goal <6. Will adjust medication based on result. No erythema, swelling, or warmth to touch in 1st mtp bilaterally.

## 2018-10-18 NOTE — Assessment & Plan Note (Addendum)
The patient stated that he has been having dysuria without any lower abdominal pain, fevers, chills, or burning sensation when urinating. He was prescribed a course of cephalexin for uti recently.   Assessment and plan  Patient with repeated symptoms of uti. Will check ua, urine culture, urine gonorrhea and chalmydia, and hiv.

## 2018-10-18 NOTE — Progress Notes (Signed)
Internal Medicine Clinic Attending  Case discussed with Dr. Chundi at the time of the visit.  We reviewed the resident's history and exam and pertinent patient test results.  I agree with the assessment, diagnosis, and plan of care documented in the resident's note. 

## 2018-10-19 ENCOUNTER — Telehealth: Payer: Self-pay | Admitting: Internal Medicine

## 2018-10-19 ENCOUNTER — Other Ambulatory Visit: Payer: Self-pay | Admitting: *Deleted

## 2018-10-19 DIAGNOSIS — N3941 Urge incontinence: Secondary | ICD-10-CM

## 2018-10-19 DIAGNOSIS — M109 Gout, unspecified: Secondary | ICD-10-CM

## 2018-10-19 DIAGNOSIS — N401 Enlarged prostate with lower urinary tract symptoms: Secondary | ICD-10-CM

## 2018-10-19 LAB — HIV ANTIBODY (ROUTINE TESTING W REFLEX): HIV Screen 4th Generation wRfx: NONREACTIVE

## 2018-10-19 LAB — URINE CYTOLOGY ANCILLARY ONLY
Chlamydia: NEGATIVE
Neisseria Gonorrhea: NEGATIVE

## 2018-10-19 LAB — URIC ACID: Uric Acid: 6.9 mg/dL (ref 3.7–8.6)

## 2018-10-19 MED ORDER — ALLOPURINOL 100 MG PO TABS: 200.0000 mg | ORAL_TABLET | Freq: Every day | ORAL | 3 refills | Status: DC

## 2018-10-19 MED ORDER — ALLOPURINOL 100 MG PO TABS: 200.0000 mg | ORAL_TABLET | Freq: Every day | ORAL | 1 refills | Status: DC

## 2018-10-19 MED ORDER — COLCHICINE 0.6 MG PO TABS: 0.6000 mg | ORAL_TABLET | Freq: Two times a day (BID) | ORAL | 3 refills | Status: DC

## 2018-10-19 MED ORDER — OXYBUTYNIN CHLORIDE 5 MG PO TABS: 5.0000 mg | ORAL_TABLET | Freq: Three times a day (TID) | ORAL | 1 refills | Status: DC

## 2018-10-19 NOTE — Telephone Encounter (Signed)
Called patient was unable to reach. Left message for patient to call back clinic. Allopurinol dose should be increased from 100mg  daily to 200mg  daily as his uric acid is still elevated and goal should be <6.5. Follow up 4 weeks.   Lars Mage, MD Internal Medicine PGY3 [phone] 10/19/2018, 8:21 AM

## 2018-10-20 ENCOUNTER — Other Ambulatory Visit: Payer: Self-pay | Admitting: Internal Medicine

## 2018-10-20 MED ORDER — CIPROFLOXACIN HCL 500 MG PO TABS: 500.0000 mg | ORAL_TABLET | Freq: Two times a day (BID) | ORAL | 0 refills | Status: AC

## 2018-10-20 NOTE — Progress Notes (Unsigned)
Prescribed patient ciprofloxacin 500mg  bid for uti.   Lars Mage, MD Internal Medicine PGY3 Pager:[phone] 10/20/2018, 9:03 AM

## 2018-10-22 ENCOUNTER — Other Ambulatory Visit: Payer: Self-pay | Admitting: Internal Medicine

## 2018-10-22 LAB — URINE CULTURE

## 2018-10-22 MED ORDER — CIPROFLOXACIN HCL 500 MG PO TABS: 500.0000 mg | ORAL_TABLET | Freq: Two times a day (BID) | ORAL | 0 refills | Status: AC

## 2018-10-27 ENCOUNTER — Telehealth: Payer: Self-pay | Admitting: Endocrinology

## 2018-10-27 ENCOUNTER — Other Ambulatory Visit: Payer: Self-pay | Admitting: Endocrinology

## 2018-10-27 NOTE — Telephone Encounter (Signed)
MEDICATION: [facility] VERIO test strip  PHARMACY:   [facility], [place] (Phone) [phone] (Fax)    IS THIS A 90 DAY SUPPLY : Yes  IS PATIENT OUT OF MEDICATION: Yes  IF NOT; HOW MUCH IS LEFT: 0  LAST APPOINTMENT DATE: @9 /23/2020  NEXT APPOINTMENT DATE:@9 /30/2020  DO WE HAVE YOUR PERMISSION TO LEAVE A DETAILED MESSAGE: Yes  OTHER COMMENTS:    **Let patient know to contact pharmacy at the end of the day to make sure medication is ready. **  ** Please notify patient to allow 48-72 hours to process**  **Encourage patient to contact the pharmacy for refills or they can request refills through [facility]**

## 2018-10-27 NOTE — Telephone Encounter (Signed)
Rx sent 

## 2018-11-03 ENCOUNTER — Other Ambulatory Visit: Payer: Self-pay

## 2018-11-03 ENCOUNTER — Encounter: Payer: Self-pay | Admitting: Endocrinology

## 2018-11-03 ENCOUNTER — Ambulatory Visit (INDEPENDENT_AMBULATORY_CARE_PROVIDER_SITE_OTHER): Payer: Medicare Other | Admitting: Endocrinology

## 2018-11-03 VITALS — BP 142/80 | HR 75 | Temp 97.9°F | Wt 269.8 lb

## 2018-11-03 DIAGNOSIS — E1129 Type 2 diabetes mellitus with other diabetic kidney complication: Secondary | ICD-10-CM

## 2018-11-03 DIAGNOSIS — R809 Proteinuria, unspecified: Secondary | ICD-10-CM

## 2018-11-03 DIAGNOSIS — E1142 Type 2 diabetes mellitus with diabetic polyneuropathy: Secondary | ICD-10-CM | POA: Diagnosis not present

## 2018-11-03 DIAGNOSIS — Z794 Long term (current) use of insulin: Secondary | ICD-10-CM | POA: Diagnosis not present

## 2018-11-03 LAB — POCT GLYCOSYLATED HEMOGLOBIN (HGB A1C): Hemoglobin A1C: 6.8 % — AB (ref 4.0–5.6)

## 2018-11-03 NOTE — Progress Notes (Signed)
Patient ID: [name] [name], male   DOB: [birth year], 85 y.o.   MRN: [id number]   Reason for Appointment: diabetes follow-up  History of Present Illness     DIABETES type II:  Diagnosis date: 1990  Previous history: He has been on insulin since 2005 Previously on Lantus and also subsequently basal bolus regimen but this was changed to NPH and regular insulin because of cost his A1c has been usually near 7% in the past He was switched from Lantus to NPH because of cost  Recent history:   INSULIN REGIMEN:    NOVOLIN-N -Relion 15 units before breakfast and 15 units supper Relion/ Regular Insulin 15 units twice daily before breakfast and dinner  His last visit was in 07/2018  Current blood sugar patterns and problems identified:   His A1c is somewhat better at 6.8  He has changed his insulin doses again  On his last visit he was told to continue 20 NPH  Not clear why he has reduced his NPH, previously taking 20 NPH in the evening but does not report history of overnight hypoglycemia  He feels it convenient to take the same dose of NPH and regular both at breakfast and dinnertime  Although his blood sugars are averaging 170 before meals he probably has had better postprandial readings since his A1c is better  Also no hypoglycemia  Recently has been trying to cut back on portions because of having gained significant amount of weight this year  Lab glucose not available today     Oral hypoglycemic drugs: Metformin ER 500 mg -- 2 in p.m.        Side effects from medications: None .          Monitors blood glucose: <1 times a day.    Glucometer:   One Touch Verio        Blood Glucose readings from download:   PRE-MEAL Fasting Lunch Dinner Bedtime Overall  Glucose range:  98-205   111-228    Mean/median:  159   183   171   .  Previous readings:  PRE-MEAL Fasting Lunch Dinner Bedtime Overall  Glucose range:  61-231   65-289    Mean/median:  115   202  141    POST-MEAL PC Breakfast PC Lunch PC Dinner  Glucose range:    95-324  Mean/median:    204       Meals: 2-3 meals per day.  breakfast is eggs,oatmeal usually.   Breakfast 7 AM, lunch 1 PM and dinner 6-7                 Dietician visit: Most recent:  02/2017     Weight control:   Wt Readings from Last 3 Encounters:  11/03/18 269 lb 12.8 oz (122.4 kg)  10/18/18 281 lb 1.6 oz (127.5 kg)  09/20/18 275 lb 11.2 oz (125.1 kg)           Diabetes labs:  Lab Results  Component Value Date   HGBA1C 6.8 (A) 11/03/2018   HGBA1C 7.8 (H) 07/28/2018   HGBA1C 9.2 (A) 02/26/2018   Lab Results  Component Value Date   MICROALBUR 21.0 (H) 06/05/2017   LDLCALC 47 11/06/2017   CREATININE 1.34 07/28/2018     Allergies as of 11/03/2018   No Known Allergies     Medication List       Accurate as of November 03, 2018  9:53 AM. If you have any questions, ask your nurse or  doctor.        allopurinol 100 MG tablet Commonly known as: ZYLOPRIM Take 2 tablets (200 mg total) by mouth daily.   amLODipine 10 MG tablet Commonly known as: NORVASC Take 1 tablet (10 mg total) by mouth daily.   aspirin EC 81 MG tablet Take 1 tablet (81 mg total) by mouth daily.   atorvastatin 40 MG tablet Commonly known as: LIPITOR Take 1 tablet by mouth once daily   cephALEXin 500 MG capsule Commonly known as: KEFLEX Take 1 capsule (500 mg total) by mouth 2 (two) times daily.   cilostazol 50 MG tablet Commonly known as: PLETAL Take 1 tablet (50 mg total) by mouth 2 (two) times daily.   colchicine 0.6 MG tablet Take 1 tablet (0.6 mg total) by mouth 2 (two) times daily.   furosemide 20 MG tablet Commonly known as: LASIX Take 1 tablet (20 mg total) by mouth daily.   gabapentin 100 MG capsule Commonly known as: NEURONTIN Take 1 capsule (100 mg total) by mouth 3 (three) times daily.   insulin NPH Human 100 UNIT/ML injection Commonly known as: NOVOLIN N Inject 20 Units into the skin 2 (two) times  daily.   Insulin Syringe-Needle U-100 31G X 15/64" 0.3 ML Misc Use to inject insulin 4 times a day   loratadine 10 MG tablet Commonly known as: Allergy Relief Take 1 tablet (10 mg total) by mouth daily as needed for rhinitis.   losartan 50 MG tablet Commonly known as: COZAAR Take 1 tablet by mouth once daily   metFORMIN 500 MG 24 hr tablet Commonly known as: GLUCOPHAGE-XR TAKE 2 TABLETS BY MOUTH ONCE DAILY WITH SUPPER   metoprolol tartrate 25 MG tablet Commonly known as: LOPRESSOR Take 1 tablet (25 mg total) by mouth 2 (two) times daily.   NOVOLIN R RELION IJ Inject 15 Units as directed 2 (two) times daily before a meal.   omeprazole 20 MG capsule Commonly known as: PRILOSEC Take 1 capsule (20 mg total) by mouth daily.   OneTouch Delica Lancets Fine Misc Use to check blood sugar 2 times per day dx code E11.49   OneTouch Verio Flex System w/Device Kit Use OneTouch Verio Flex meter to check blood sugar twice daily.   OneTouch Verio test strip Generic drug: glucose blood USE  STRIP TO CHECK GLUCOSE TWICE DAILY   oxybutynin 5 MG tablet Commonly known as: DITROPAN Take 1 tablet (5 mg total) by mouth 3 (three) times daily.   tamsulosin 0.4 MG Caps capsule Commonly known as: FLOMAX Take 1 capsule (0.4 mg total) by mouth daily.       Allergies: No Known Allergies  Past Medical History:  Diagnosis Date  . Benign prostatic hyperplasia (BPH) with urinary urge incontinence 11/13/2017  . Chronic left shoulder pain 11/13/2017  . Chronic non-seasonal allergic rhinitis 02/17/2006  . Chronic systolic heart failure ([place]) 10/31/2016   Echo (05/03/2015): LVEF 59-56%, grade 2 diastolic dysfunction  . Coronary artery disease involving native coronary artery of native heart without angina pectoris 09/30/2007   s/p CABG on 01/05/2013: left internal mammary artery to left anterior descending, saphenous vein graft to obtuse marginal 1, sequential saphenous vein graft to acute marginal and  posterior descending  . Essential hypertension 11/04/2005  . Gastroesophageal reflux disease with hiatal hernia 11/04/2005  . History of prostate cancer 02/17/2006   Diagnosed 1995, s/p seed implantation in the late 1990's, PSA undetectable 04/10/2016  . Hyperlipidemia 11/04/2005  . Obesity (BMI 30.0-34.9) 11/13/2017  . Peripheral arterial  disease ([place]) 09/30/2007   with claudication, failed attempt at LLE PTCA  . Type 2 diabetes mellitus with microalbuminuria, with long-term current use of insulin ([place]) 11/13/2017  . 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 Type 2 diabetes mellitus with peripheral neuropathy ([place]) 11/04/2005  . Urethral stricture in male 11/13/2017   Noted on cystoscopy 12/18/2014.  Reportedly asked to do chronic intermittent self catheterizations, but has not.    Past Surgical History:  Procedure Laterality Date  . CARDIAC CATHETERIZATION  10/05/08   REVEALS HYPOKINESIS OF THE LATERAL WALL AND EF 50-55%  . CORONARY ANGIOPLASTY WITH STENT PLACEMENT    . CORONARY ARTERY BYPASS GRAFT N/A 01/05/2013   Procedure: CORONARY ARTERY BYPASS GRAFTING (CABG);  Surgeon: Melrose Nakayama, MD;  Location: [facility];  Service: Open Heart Surgery;  Laterality: N/A;  . INTRAOPERATIVE TRANSESOPHAGEAL ECHOCARDIOGRAM N/A 01/05/2013   Procedure: INTRAOPERATIVE TRANSESOPHAGEAL ECHOCARDIOGRAM;  Surgeon: Melrose Nakayama, MD;  Location: [place];  Service: Open Heart Surgery;  Laterality: N/A;  . LEFT HEART CATHETERIZATION WITH CORONARY ANGIOGRAM N/A 01/03/2013   Procedure: LEFT HEART CATHETERIZATION WITH CORONARY ANGIOGRAM;  Surgeon: Lorretta Harp, MD;  Location: [facility] CATH LAB;  Service: Cardiovascular;  Laterality: N/A;  . LOWER EXTREMITY ANGIOGRAM Right 12/07/2012   unsuccessful attempt at percutaneous revascularization of a calcified long segment chronic total occlusion mid left SFA /notes 12/07/2012  . LOWER EXTREMITY  ANGIOGRAM N/A 12/07/2012   Procedure: LOWER EXTREMITY ANGIOGRAM;  Surgeon: Lorretta Harp, MD;  Location: [facility] CATH LAB;  Service: Cardiovascular;  Laterality: N/A;  . PROSTATE SURGERY  1990's    Family History  Problem Relation Age of Onset  . Kidney failure Mother   . Hypertension Father   . Stroke Father   . Other Sister   . Other Brother   . Other Brother   . Other Brother   . Other Brother   . Other Brother   . Other Brother   . Other Brother   . Other Brother   . Healthy Daughter   . Healthy Daughter   . Healthy Daughter   . Healthy Son     Social History:  reports that he quit smoking about 38 years ago. His smoking use included cigarettes. He has a 15.00 pack-year smoking history. He has never used smokeless tobacco. He reports that he does not drink alcohol or use drugs.  Review of Systems:   Hypertension:  has had long-standing hypertension followed by his PCP and other physicians Last BP was done in the emergency room  BP Readings from Last 3 Encounters:  11/03/18 (!) 142/80  10/18/18 128/65  09/20/18 (!) 142/72     Lipids: Has been treated with Lipitor 40 mg Followed by cardiologist   Lab Results  Component Value Date   CHOL 98 11/06/2017   HDL 31.60 (L) 11/06/2017   LDLCALC 47 11/06/2017   TRIG 99.0 11/06/2017   CHOLHDL 3 11/06/2017    Neuropathy: Has been treated with gabapentin 100 mg by his PCP  Claudication: Has pain in the lower legs when he is walking, especially on the left calf     Foot exam done in 03/2017, has absent pedal pulses otherwise normal monofilament sensation   RENAL dysfunction: Has variable labs Etiology not clear, does not have microalbuminuria  Lab Results  Component Value Date   CREATININE 1.34 07/28/2018   CREATININE 1.36 (H) 06/01/2018   CREATININE 1.54 (H) 02/26/2018  LABS:  Office Visit on 11/03/2018  Component Date Value Ref Range Status  . Hemoglobin A1C 11/03/2018 6.8* 4.0 - 5.6 % Final      Examination:   BP (!) 142/80   Pulse 75   Temp 97.9 F (36.6 C)   Wt 269 lb 12.8 oz (122.4 kg)   SpO2 98%   BMI 35.60 kg/m   Body mass index is 35.6 kg/m.     ASSESSMENT/ PLAN:     Diabetes type 2 insulin-dependent:   His A1c is 6.8 compared to 7.8   See history of present illness for detailed discussion of current diabetes management, blood sugar patterns and problems identified  He is requiring somewhat less amount of insulin especially NPH Also he has reduced evening NPH on his own which does not appear to cause higher fasting readings As before he continues to adjust his insulin on his own More recently with trying to cut back on portions he is starting to lose some of the weight he had gained His activity level is limited  Will continue the same regimen However advised him to cut back on sweets snacks such as raisins and a lot of fruits or any juices  Lipids: Followed by other physicians  HYPERTENSION: Blood pressure  relatively well controlled  Needs repeat microalbumin and lipids today    There are no Patient Instructions on file for this visit.       Elayne Snare 11/03/2018, 9:53 AM

## 2018-11-03 NOTE — Patient Instructions (Signed)
Cut back raisins and hi fat meats

## 2018-11-04 LAB — COMPREHENSIVE METABOLIC PANEL
ALT: 18 U/L (ref 0–53)
AST: 18 U/L (ref 0–37)
Albumin: 3.9 g/dL (ref 3.5–5.2)
Alkaline Phosphatase: 93 U/L (ref 39–117)
BUN: 19 mg/dL (ref 6–23)
CO2: 26 mEq/L (ref 19–32)
Calcium: 9 mg/dL (ref 8.4–10.5)
Chloride: 104 mEq/L (ref 96–112)
Creatinine, Ser: 1.34 mg/dL (ref 0.40–1.50)
GFR: 61.29 mL/min (ref >60.00)
Glucose, Bld: 215 mg/dL — ABNORMAL HIGH (ref 70–99)
Potassium: 4 mEq/L (ref 3.5–5.1)
Sodium: 137 mEq/L (ref 135–145)
Total Bilirubin: 0.5 mg/dL (ref 0.2–1.2)
Total Protein: 7.1 g/dL (ref 6.0–8.3)

## 2018-11-04 LAB — LIPID PANEL
Cholesterol: 86 mg/dL (ref 0–200)
HDL: 28 mg/dL — ABNORMAL LOW (ref >39.00)
LDL Cholesterol: 34 mg/dL (ref 0–99)
NonHDL: 57.89
Total CHOL/HDL Ratio: 3
Triglycerides: 120 mg/dL (ref 0.0–149.0)
VLDL: 24 mg/dL (ref 0.0–40.0)

## 2018-11-04 LAB — MICROALBUMIN / CREATININE URINE RATIO
Creatinine,U: 61.4 mg/dL
Microalb Creat Ratio: 19 mg/g (ref 0.0–30.0)
Microalb, Ur: 11.7 mg/dL — ABNORMAL HIGH (ref 0.0–1.9)

## 2018-11-04 NOTE — Addendum Note (Signed)
Addended by: Kaylyn Lim I on: 11/04/2018 10:29 AM   Modules accepted: Orders

## 2018-11-19 ENCOUNTER — Ambulatory Visit: Payer: Medicare Other | Admitting: Internal Medicine

## 2018-11-19 ENCOUNTER — Other Ambulatory Visit: Payer: Self-pay

## 2018-11-19 ENCOUNTER — Encounter: Payer: Self-pay | Admitting: Internal Medicine

## 2018-11-19 DIAGNOSIS — I10 Essential (primary) hypertension: Secondary | ICD-10-CM

## 2018-11-19 DIAGNOSIS — E785 Hyperlipidemia, unspecified: Secondary | ICD-10-CM

## 2018-11-19 DIAGNOSIS — I5022 Chronic systolic (congestive) heart failure: Secondary | ICD-10-CM

## 2018-11-19 DIAGNOSIS — N401 Enlarged prostate with lower urinary tract symptoms: Secondary | ICD-10-CM | POA: Diagnosis not present

## 2018-11-19 DIAGNOSIS — I110 Hypertensive heart disease with heart failure: Secondary | ICD-10-CM

## 2018-11-19 DIAGNOSIS — M1A9XX0 Chronic gout, unspecified, without tophus (tophi): Secondary | ICD-10-CM

## 2018-11-19 DIAGNOSIS — I2510 Atherosclerotic heart disease of native coronary artery without angina pectoris: Secondary | ICD-10-CM

## 2018-11-19 DIAGNOSIS — Z79899 Other long term (current) drug therapy: Secondary | ICD-10-CM

## 2018-11-19 DIAGNOSIS — R300 Dysuria: Secondary | ICD-10-CM

## 2018-11-19 DIAGNOSIS — N3941 Urge incontinence: Secondary | ICD-10-CM

## 2018-11-19 DIAGNOSIS — M109 Gout, unspecified: Secondary | ICD-10-CM

## 2018-11-19 DIAGNOSIS — K219 Gastro-esophageal reflux disease without esophagitis: Secondary | ICD-10-CM

## 2018-11-19 DIAGNOSIS — E1142 Type 2 diabetes mellitus with diabetic polyneuropathy: Secondary | ICD-10-CM

## 2018-11-19 DIAGNOSIS — Z7982 Long term (current) use of aspirin: Secondary | ICD-10-CM

## 2018-11-19 DIAGNOSIS — Z6835 Body mass index (BMI) 35.0-35.9, adult: Secondary | ICD-10-CM

## 2018-11-19 DIAGNOSIS — K449 Diaphragmatic hernia without obstruction or gangrene: Secondary | ICD-10-CM

## 2018-11-19 DIAGNOSIS — R350 Frequency of micturition: Secondary | ICD-10-CM

## 2018-11-19 DIAGNOSIS — E7800 Pure hypercholesterolemia, unspecified: Secondary | ICD-10-CM

## 2018-11-19 DIAGNOSIS — E669 Obesity, unspecified: Secondary | ICD-10-CM

## 2018-11-19 DIAGNOSIS — N3000 Acute cystitis without hematuria: Secondary | ICD-10-CM

## 2018-11-19 DIAGNOSIS — Z0000 Encounter for general adult medical examination without abnormal findings: Secondary | ICD-10-CM

## 2018-11-19 LAB — GLUCOSE, CAPILLARY: Glucose-Capillary: 155 mg/dL — ABNORMAL HIGH (ref 70–99)

## 2018-11-19 MED ORDER — OMEPRAZOLE 20 MG PO CPDR: 20.0000 mg | DELAYED_RELEASE_CAPSULE | Freq: Every day | ORAL | 3 refills | Status: DC

## 2018-11-19 MED ORDER — SULFAMETHOXAZOLE-TRIMETHOPRIM 800-160 MG PO TABS: 1.0000 | ORAL_TABLET | Freq: Two times a day (BID) | ORAL | 0 refills | Status: DC

## 2018-11-19 NOTE — Assessment & Plan Note (Signed)
He had a UC which was positive. He did not complete treatment and continues to have symptoms.  Will plan to treat with Bactrim 1 tab DS BID for 7 days.  I encouraged him to complete the Abx.

## 2018-11-19 NOTE — Assessment & Plan Note (Signed)
LUTS continue to be an issue.  He has performed behavior modification with good results and I encouraged him to continue this.  He is on tamsulosin and oxybutynin with good results.   Plan Continue 2 drug regimen.

## 2018-11-19 NOTE — Assessment & Plan Note (Signed)
His blood pressure today is at his target, 117/98.  He is taking amlodipine, losartan and metoprolol without issue.  He has no dizziness, no chest pain.  His renal function checked 2 weeks ago showed stable CKD.  He has no change in urinary habits (beyond dysuria).  Plan Continue 3 drug regimen.

## 2018-11-19 NOTE — Assessment & Plan Note (Signed)
Flu shot last month.  Foot exam today.

## 2018-11-19 NOTE — Assessment & Plan Note (Signed)
Discussed today.  We spoke about decreasing his medications and I think he can likely decrease some of them if he were to lose weight, exercise more.  He has started an exercise regimen and I encouraged him to continue this now that the weather was more pleasant.

## 2018-11-19 NOTE — Assessment & Plan Note (Signed)
Last TTE showed HF of around 45% and grade 2 diastolic dysfunction.  He is taking his cardiovascular medications without issue including aspirin, statin, beta blocker and ARB.  He has no SOB, no LE swelling.   Plan Continue drug regimen, good BP control Monitor for any worsening symptoms.

## 2018-11-19 NOTE — Assessment & Plan Note (Signed)
Doing well, complaints of no chest pain.  He has started an exercise regimen and I encouraged him to continue working on this.  He is taking his aspirin, atorvastatin without issue.  He is also on cilostozol for PAD.   Plan Continue to monitor Continue aspirin, statin and good BP control.

## 2018-11-19 NOTE — Assessment & Plan Note (Signed)
Doing well, no further pain.  Using ETOH spray to help with pain.  He is taking allopurinol, but I am not clear if he increased to 200mg  daily as requested last month.   Check uric acid level Call with recommendations if still above 6.5

## 2018-11-19 NOTE — Progress Notes (Signed)
   Subjective:    Patient ID: [name] [name], male    DOB: 08-06-1931, 85 y.o.   MRN: [id number]  3 month follow up for HTN and 1 month follow up for gout  HPI  Mr. Caminiti is a pleasant 85 year old man who presented for regular follow up.  He notes that he is doing well.  His gout is improved.  If he gets any issues, he sprays alcohol on his foot and this helps.  He has initiated an exercise program and is hopeful to keep doing it.  He is very interested in getting off of some of his medications, as his wife has done.  We talked about weight loss and exercise which would help.    He has LUTS with nocturia and decreased emptying.  He recently was diagnosed with a UTI but only took 2 doses of his medication and lost the bottle.  He continues to have dysuria, no fever, chills, CVA tenderness, suprapubic tenderness.  He takes oxybutynin and tamsulosin.  His nocturia is much improved if he stops drinking fluids at 9pm.   Otherwise, doing well, requests a refill of his omeprazole.  He has well controlled DM2 and follows with Dr. Dwyane Dee.  Interested in diabetic shoes, I will send a message to Dr. [name] office to follow up.    Review of Systems  Constitutional: Positive for activity change (improved exercise). Negative for appetite change and fever.  Eyes: Negative for photophobia and visual disturbance.  Respiratory: Negative for cough and shortness of breath.   Cardiovascular: Negative for chest pain and leg swelling.  Gastrointestinal: Negative for abdominal pain, constipation and diarrhea.  Genitourinary: Positive for dysuria, enuresis, frequency and urgency. Negative for decreased urine volume, difficulty urinating, discharge, flank pain, genital sores, hematuria and penile pain.  Musculoskeletal: Negative for arthralgias and back pain.  Skin: Negative for color change and pallor.  Neurological: Negative for dizziness and weakness.       Objective:   Physical Exam Vitals signs and  nursing note reviewed.  Constitutional:      General: He is not in acute distress.    Appearance: He is obese.  HENT:     Head: Normocephalic and atraumatic.  Cardiovascular:     Rate and Rhythm: Normal rate and regular rhythm.     Heart sounds: No murmur.  Pulmonary:     Effort: Pulmonary effort is normal. No respiratory distress.  Abdominal:     General: Bowel sounds are normal. There is no distension.     Palpations: Abdomen is soft.     Tenderness: There is no abdominal tenderness.  Musculoskeletal:        General: No swelling or tenderness.     Right lower leg: No edema.     Left lower leg: No edema.  Skin:    General: Skin is warm and dry.  Neurological:     Mental Status: He is alert and oriented to person, place, and time.  Psychiatric:        Mood and Affect: Mood normal.        Behavior: Behavior normal.     Uric Acid level today.       Assessment & Plan:  RTC in 3 months, sooner if needed.

## 2018-11-19 NOTE — Patient Instructions (Addendum)
Mr. Alder - -  For your urinary infection - Please take Bactrim 1 tablet twice a day for 7 days.  Please take the entire prescription.   I have refilled your omeprazole.   Please come back to see me 3 months, sooner if you need!

## 2018-11-19 NOTE — Assessment & Plan Note (Signed)
He follows with Dr. Dwyane Dee, last seen 11/03/18.   Will send a note to Dr. Dwyane Dee about diabetic shoes and see if these can be arranged.

## 2018-11-19 NOTE — Assessment & Plan Note (Signed)
Controlled.   Refill Omeprazole today.

## 2018-11-19 NOTE — Assessment & Plan Note (Signed)
Last LDL is 34.  He is on a statin which will be continued.

## 2018-11-20 LAB — URIC ACID: Uric Acid: 5.5 mg/dL (ref 3.7–8.6)

## 2018-12-01 NOTE — Progress Notes (Signed)
Cardiology Office Note    Date:  12/02/2018   ID:  [name] [name], DOB 1932/01/22, MRN [id number]  PCP:  [name], MD  Cardiologist:  Mertie Moores, MD  PV: Dr. Gwenlyn Found  Chief Complaint: 6  Months follow up  History of Present Illness:   [name] [name] is a 85 y.o. male with hx of CAD s/p CABG in 2014, PVD, DM, HTN, chronic systolic CHF and HLD seen for follow up.   Has seen Dr. Gwenlyn Found for peripheral vascular disease. He high-grade segmental right and total left SFA stenosis with single-vessel runoff below the knees bilaterally. Dr. Gwenlyn Found attempted unsuccessfully to percutaneously revascularize his left SFA.  Last echo 04/2015: LVEF of 45-50%, mild LVH, grade 2 DD, mild MR, moderately dilated LA and PA pressure of 77m Hg.  Last stress test 04/2015: intermediate risk. Findings consistent with prior myocardial infarction.  Last seen by Dr. NAcie Fredrickson4/2020. Personally reviewed recent lab work by PCP 11/04/2018.  Here today for follow up.  He is poor historian with multiple complains. Patient reports bilateral calf pain after about 1-2 blocks of walking. Has mild edema on exam but denies it. Reports epigastric pain which helps with omeprazole and burping. Reports distended belly. No exercise. Has some dyspnea with walking. His symptoms not similar to prior angina when required CABG. No syncope, dizziness or palpitations. Uses low sodium diet.   Past Medical History:  Diagnosis Date  . Benign prostatic hyperplasia (BPH) with urinary urge incontinence 11/13/2017  . Chronic left shoulder pain 11/13/2017  . Chronic non-seasonal allergic rhinitis 02/17/2006  . Chronic systolic heart failure ([facility] 10/31/2016   Echo (05/03/2015): LVEF 482-64% grade 2 diastolic dysfunction  . Coronary artery disease involving native coronary artery of native heart without angina pectoris 09/30/2007   s/p CABG on 01/05/2013: left internal mammary artery to left anterior descending, saphenous vein graft  to obtuse marginal 1, sequential saphenous vein graft to acute marginal and posterior descending  . Essential hypertension 11/04/2005  . Gastroesophageal reflux disease with hiatal hernia 11/04/2005  . History of prostate cancer 02/17/2006   Diagnosed 1995, s/p seed implantation in the late 1990's, PSA undetectable 04/10/2016  . Hyperlipidemia 11/04/2005  . Obesity (BMI 30.0-34.9) 11/13/2017  . Peripheral arterial disease ([facility] 09/30/2007   with claudication, failed attempt at LLE PTCA  . Type 2 diabetes mellitus with microalbuminuria, with long-term current use of insulin ([facility] 11/13/2017  . Type 2 diabetes mellitus with mild 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, bilateral ([facility] 03/24/2018  . Type 2 diabetes mellitus with peripheral neuropathy ([facility] 11/04/2005  . Urethral stricture in male 11/13/2017   Noted on cystoscopy 12/18/2014.  Reportedly asked to do chronic intermittent self catheterizations, but has not.    Past Surgical History:  Procedure Laterality Date  . CARDIAC CATHETERIZATION  10/05/08   REVEALS HYPOKINESIS OF THE LATERAL WALL AND EF 50-55%  . CORONARY ANGIOPLASTY WITH STENT PLACEMENT    . CORONARY ARTERY BYPASS GRAFT N/A 01/05/2013   Procedure: CORONARY ARTERY BYPASS GRAFTING (CABG);  Surgeon: SMelrose Nakayama MD;  [facility]  Service: Open Heart Surgery;  Laterality: N/A;  . INTRAOPERATIVE TRANSESOPHAGEAL ECHOCARDIOGRAM N/A 01/05/2013   Procedure: INTRAOPERATIVE TRANSESOPHAGEAL ECHOCARDIOGRAM;  Surgeon: SMelrose Nakayama MD;  [facility]  Service: Open Heart Surgery;  Laterality: N/A;  . LEFT HEART CATHETERIZATION WITH CORONARY ANGIOGRAM N/A 01/03/2013   Procedure: LEFT HEART CATHETERIZATION WITH CORONARY ANGIOGRAM;  Surgeon: JLorretta Harp MD;  [facility];  Service: Cardiovascular;  Laterality: N/A;  . LOWER EXTREMITY ANGIOGRAM Right 12/07/2012   unsuccessful attempt at percutaneous revascularization of a calcified long segment  chronic total occlusion mid left SFA /notes 12/07/2012  . LOWER EXTREMITY ANGIOGRAM N/A 12/07/2012   Procedure: LOWER EXTREMITY ANGIOGRAM;  Surgeon: Lorretta Harp, MD;  Location[facility];  Service: Cardiovascular;  Laterality: N/A;  . PROSTATE SURGERY  1990's    Current Medications: Prior to Admission medications   Medication Sig Start Date End Date Taking? Authorizing Provider  allopurinol (ZYLOPRIM) 100 MG tablet Take 2 tablets (200 mg total) by mouth daily. 10/19/18  Yes [name], MD  amLODipine (NORVASC) 10 MG tablet Take 1 tablet (10 mg total) by mouth daily. 02/24/18  Yes Nahser, Wonda Cheng, MD  aspirin EC 81 MG tablet Take 1 tablet (81 mg total) by mouth daily. 10/31/16  Yes Nahser, Wonda Cheng, MD  atorvastatin (LIPITOR) 40 MG tablet Take 1 tablet by mouth once daily 09/22/18  Yes Nahser, Wonda Cheng, MD  Blood Glucose Monitoring Suppl (ONETOUCH VERIO FLEX SYSTEM) w/Device KIT Use OneTouch Verio Flex meter to check blood sugar twice daily. 09/22/18  Yes Elayne Snare, MD  colchicine 0.6 MG tablet Take 1 tablet (0.6 mg total) by mouth 2 (two) times daily. 10/19/18  Yes [name], MD  gabapentin (NEURONTIN) 100 MG capsule Take 1 capsule (100 mg total) by mouth 3 (three) times daily. 07/09/18  Yes [name], MD  insulin NPH Human (HUMULIN N,NOVOLIN N) 100 UNIT/ML injection Inject 20 Units into the skin 2 (two) times daily.   Yes [provider]  Insulin Regular Human (NOVOLIN R RELION IJ) Inject 15 Units as directed 2 (two) times daily before a meal.   Yes [provider]  Insulin Syringe-Needle U-100 31G X 15/64" 0.3 ML MISC Use to inject insulin 4 times a day 11/27/17  Yes Oval Linsey, MD  loratadine (ALLERGY RELIEF) 10 MG tablet Take 1 tablet (10 mg total) by mouth daily as needed for rhinitis. 02/26/18  Yes Oval Linsey, MD  losartan (COZAAR) 50 MG tablet Take 1 tablet by mouth once daily 09/21/18  Yes Elayne Snare, MD  metFORMIN (GLUCOPHAGE-XR) 500 MG 24 hr  tablet TAKE 2 TABLETS BY MOUTH ONCE DAILY WITH SUPPER 08/15/18  Yes Elayne Snare, MD  metoprolol tartrate (LOPRESSOR) 25 MG tablet Take 1 tablet (25 mg total) by mouth 2 (two) times daily. 02/24/18  Yes Nahser, Wonda Cheng, MD  omeprazole (PRILOSEC) 20 MG capsule Take 1 capsule (20 mg total) by mouth daily. 11/19/18  Yes [name], MD  [facility] DELICA LANCETS FINE MISC Use to check blood sugar 2 times per day dx code E11.49 07/20/14  Yes Elayne Snare, MD  [facility] VERIO test strip USE  STRIP TO CHECK GLUCOSE TWICE DAILY 10/27/18  Yes Elayne Snare, MD  oxybutynin (DITROPAN) 5 MG tablet Take 1 tablet (5 mg total) by mouth 3 (three) times daily. 10/19/18  Yes [name], MD  tamsulosin (FLOMAX) 0.4 MG CAPS capsule Take 1 capsule (0.4 mg total) by mouth daily. 04/01/18  Yes Oval Linsey, MD  furosemide (LASIX) 20 MG tablet Take 1 tablet (20 mg total) by mouth daily. 05/25/18 08/23/18  Leanor Kail, PA     Allergies:   Patient has no known allergies.   Social History   Socioeconomic History  . Marital status: Married    Spouse name: Not on file  . Number of children: 4  . Years of education: 64  .  Highest education level: 12th grade  Occupational History  . 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 Financial resource strain: Very hard  . Food insecurity    Worry: Never true    Inability: Never true  . Transportation needs    Medical: No    Non-medical: No  Tobacco Use  . Smoking status: Former Smoker    Packs/day: 0.75    Years: 20.00    Pack years: 15.00    Types: Cigarettes    Quit date: 07/08/1980    Years since quitting: 38.4  . Smokeless tobacco: Never Used  Substance and Sexual Activity  . Alcohol use: No    Alcohol/week: 0.0 standard drinks    Comment: 12/07/2012 "quit drinking alcohol > 25 yr ago"  . Drug use: No  . Sexual activity: Not Currently  Lifestyle  . Physical activity    Days per week: 0 days    Minutes per  session: 0 min  . Stress: Not at all  Relationships  . Social Herbalist on phone: Not on file    Gets together: Not on file    Attends religious service: More than 4 times per year    Active member of club or organization: Yes    Attends meetings of clubs or organizations: Not on file    Relationship status: Married  Other Topics Concern  . Not on file  Social History Narrative   Occupation: Company secretary   Married     Family History:  The patient's family history includes Healthy in his daughter, daughter, daughter, and son; Hypertension in his father; Kidney failure in his mother; Other in his brother, brother, brother, brother, brother, brother, brother, brother, and sister; Stroke in his father.   ROS:   Please see the history of present illness.    ROS All other systems reviewed and are negative.   PHYSICAL EXAM:   VS:  BP 116/70   Pulse 61   Ht '6\' 1"'$  (1.854 m)   Wt 269 lb 6.4 oz (122.2 kg)   SpO2 96%   BMI 35.54 kg/m    GEN: Well nourished, well developed, in no acute distress  HEENT: normal  Neck: no JVD, carotid bruits, or masses Cardiac: RRR; no murmurs, rubs, or gallops, trace BL LE  edema R > L Respiratory:  clear to auscultation bilaterally, normal work of breathing GI: soft, nontender, + distended, + BS MS: no deformity or atrophy  Skin: warm and dry, no rash Neuro:  Alert and Oriented x 3, Strength and sensation are intact Psych: euthymic mood, full affect  Wt Readings from Last 3 Encounters:  12/02/18 269 lb 6.4 oz (122.2 kg)  11/19/18 271 lb (122.9 kg)  11/03/18 269 lb 12.8 oz (122.4 kg)      Studies/Labs Reviewed:   EKG:  EKG is not ordered today.    Recent Labs: 11/04/2018: ALT 18; BUN 19; Creatinine, Ser 1.34; Potassium 4.0; Sodium 137   Lipid Panel    Component Value Date/Time   CHOL 86 11/04/2018 1030   CHOL 86 (L) 10/31/2016 1602   TRIG 120.0 11/04/2018 1030   HDL 28.00 (L) 11/04/2018 1030   HDL 32 (L) 10/31/2016 1602    CHOLHDL 3 11/04/2018 1030   VLDL 24.0 11/04/2018 1030   LDLCALC 34 11/04/2018 1030   LDLCALC 39 10/31/2016 1602    Additional studies/ records that were reviewed today include:   As  summarized above    ASSESSMENT & PLAN:   1. Epigastric pain - Seems due to GERD. Advised to take daily PPI instead of PRN. His symptoms not similar to prior angina.   2. CAD s/p CABG -  Continue ASA, statin and BB. As above.   3. Chronic systolic CHF - mild edema and abdominal distention. Check BNP. No change in meds today,   4. PAD - Seems he has claudication symptoms. Unable to tell if worse or not. Repeat study and follow up with Dr. Gwenlyn Found.   5. HLD - 11/04/2018: Cholesterol 86; HDL 28.00; LDL Cholesterol 34; Triglycerides 120.0; VLDL 24.0 - Continue statin    Medication Adjustments/Labs and Tests Ordered: Current medicines are reviewed at length with the patient today.  Concerns regarding medicines are outlined above.  Medication changes, Labs and Tests ordered today are listed in the Patient Instructions below. Patient Instructions  Medication Instructions:  Your physician recommends that you continue on your current medications as directed. Please refer to the Current Medication list given to you today.  *If you need a refill on your cardiac medications before your next appointment, please call your pharmacy*  Lab Work: TODAY: BNP If you have labs (blood work) drawn today and your tests are completely normal, you will receive your results only by: [name] MyChart Message (if you have MyChart) OR . A paper copy in the mail If you have any lab test that is abnormal or we need to change your treatment, we will call you to review the results.  Testing/Procedures: Your physician has requested that you have a lower extremity arterial duplex WITH ABI. This test is an ultrasound of the arteries in the legs or arms. It looks at arterial blood flow in the legs and arms. Allow one hour for Lower and  Upper Arterial scans. There are no restrictions or special instructions   Follow-Up: At [facility], you and your health needs are our priority.  As part of our continuing mission to provide you with exceptional heart care, we have created designated Provider Care Teams.  These Care Teams include your primary Cardiologist (physician) and Advanced Practice Providers (APPs -  Physician Assistants and Nurse Practitioners) who all work together to provide you with the care you need, when you need it.  Your next appointment:   6 months  The format for your next appointment:   In Person  Provider:   You may see Mertie Moores, MD or one of the following Advanced Practice Providers on your designated Care Team:    Richardson Dopp, PA-C  [place]in [place], [place]  Daune Perch, NP  Your physician recommends that you schedule a follow-up appointment WITH DR. Gwenlyn Found AFTER TESTING IS COMPLETE         Signed, Leanor Kail, PA  12/02/2018 1:49 PM    [address], [place], [place]  [address] Phone: [phone]; Fax: [phone]

## 2018-12-02 ENCOUNTER — Ambulatory Visit: Payer: Medicare Other | Admitting: Physician Assistant

## 2018-12-02 ENCOUNTER — Encounter: Payer: Self-pay | Admitting: Physician Assistant

## 2018-12-02 ENCOUNTER — Other Ambulatory Visit: Payer: Self-pay

## 2018-12-02 VITALS — BP 116/70 | HR 61 | Ht 73.0 in | Wt 269.4 lb

## 2018-12-02 DIAGNOSIS — I2510 Atherosclerotic heart disease of native coronary artery without angina pectoris: Secondary | ICD-10-CM

## 2018-12-02 DIAGNOSIS — E7800 Pure hypercholesterolemia, unspecified: Secondary | ICD-10-CM | POA: Diagnosis not present

## 2018-12-02 DIAGNOSIS — I739 Peripheral vascular disease, unspecified: Secondary | ICD-10-CM | POA: Diagnosis not present

## 2018-12-02 DIAGNOSIS — I10 Essential (primary) hypertension: Secondary | ICD-10-CM

## 2018-12-02 DIAGNOSIS — I5022 Chronic systolic (congestive) heart failure: Secondary | ICD-10-CM

## 2018-12-02 NOTE — Patient Instructions (Signed)
Medication Instructions:  Your physician recommends that you continue on your current medications as directed. Please refer to the Current Medication list given to you today.  *If you need a refill on your cardiac medications before your next appointment, please call your pharmacy*  Lab Work: TODAY: BNP If you have labs (blood work) drawn today and your tests are completely normal, you will receive your results only by: [name] MyChart Message (if you have MyChart) OR . A paper copy in the mail If you have any lab test that is abnormal or we need to change your treatment, we will call you to review the results.  Testing/Procedures: Your physician has requested that you have a lower extremity arterial duplex WITH ABI. This test is an ultrasound of the arteries in the legs or arms. It looks at arterial blood flow in the legs and arms. Allow one hour for Lower and Upper Arterial scans. There are no restrictions or special instructions   Follow-Up: At [facility], you and your health needs are our priority.  As part of our continuing mission to provide you with exceptional heart care, we have created designated Provider Care Teams.  These Care Teams include your primary Cardiologist (physician) and Advanced Practice Providers (APPs -  Physician Assistants and Nurse Practitioners) who all work together to provide you with the care you need, when you need it.  Your next appointment:   6 months  The format for your next appointment:   In Person  Provider:   You may see Mertie Moores, MD or one of the following Advanced Practice Providers on your designated Care Team:    Richardson Dopp, PA-C  [place], [place]  Daune Perch, NP  Your physician recommends that you schedule a follow-up appointment WITH DR. Gwenlyn Found AFTER TESTING IS COMPLETE

## 2018-12-03 LAB — PRO B NATRIURETIC PEPTIDE: NT-Pro BNP: 269 pg/mL (ref 0–486)

## 2018-12-08 ENCOUNTER — Other Ambulatory Visit: Payer: Self-pay

## 2018-12-08 ENCOUNTER — Ambulatory Visit (HOSPITAL_COMMUNITY)
Admission: RE | Admit: 2018-12-08 | Discharge: 2018-12-08 | Disposition: A | Discharge status: Home / Self Care | Payer: Medicare Other | Source: Ambulatory Visit | Attending: Cardiology | Admitting: Cardiology

## 2018-12-08 ENCOUNTER — Encounter (HOSPITAL_COMMUNITY): Payer: Medicare Other

## 2018-12-08 DIAGNOSIS — I5022 Chronic systolic (congestive) heart failure: Secondary | ICD-10-CM | POA: Diagnosis present

## 2018-12-08 DIAGNOSIS — I739 Peripheral vascular disease, unspecified: Secondary | ICD-10-CM | POA: Diagnosis not present

## 2018-12-15 ENCOUNTER — Other Ambulatory Visit: Payer: Self-pay

## 2018-12-15 ENCOUNTER — Ambulatory Visit: Payer: Medicare Other | Admitting: Cardiovascular Disease

## 2018-12-15 ENCOUNTER — Encounter: Payer: Self-pay | Admitting: Cardiovascular Disease

## 2018-12-15 VITALS — BP 152/81 | HR 66 | Ht 73.0 in | Wt 270.0 lb

## 2018-12-15 DIAGNOSIS — I2510 Atherosclerotic heart disease of native coronary artery without angina pectoris: Secondary | ICD-10-CM

## 2018-12-15 DIAGNOSIS — I739 Peripheral vascular disease, unspecified: Secondary | ICD-10-CM | POA: Diagnosis not present

## 2018-12-15 MED ORDER — CILOSTAZOL 50 MG PO TABS: 50.0000 mg | ORAL_TABLET | Freq: Two times a day (BID) | ORAL | 3 refills | Status: DC

## 2018-12-15 NOTE — Assessment & Plan Note (Signed)
History of peripheral arterial disease status post peripheral angiography and intervention by myself 12/07/2012 with demonstration of occlusion of the left SFA and high-grade calcified segmental mid right SFA with 0 vessel runoff bilaterally.  I was unsuccessful in crossing his left SFA occlusion.  And thought that his anatomy was not revascularizable.  I recommended medical therapy with Pletal.  He seen back by Robbie Lis PAC on 12/02/2018 complaining of calf claudication and was referred here for further evaluation.  I am going to begin him on Pletal 50 p.o. twice daily.  If he does not have any clinical benefit I do not think there are any other endovascular surgical options that we could attempt to try.

## 2018-12-15 NOTE — Patient Instructions (Signed)
Medication Instructions:  Start taking 50mg  Pletal twice daily.   If you need a refill on your cardiac medications before your next appointment, please call your pharmacy.   Lab work: NONE  Testing/Procedures: NONE  Follow-Up: At [facility], you and your health needs are our priority.  As part of our continuing mission to provide you with exceptional heart care, we have created designated Provider Care Teams.  These Care Teams include your primary Cardiologist (physician) and Advanced Practice Providers (APPs -  Physician Assistants and Nurse Practitioners) who all work together to provide you with the care you need, when you need it. You may see Dr Gwenlyn Found or one of the following Advanced Practice Providers on your designated Care Team:    Kerin Ransom, PA-C  [place], [place]  Coletta Memos, [place]    Your physician wants you to follow-up in: 3 months.

## 2018-12-15 NOTE — Progress Notes (Signed)
12/15/2018 [place]   May 27, 1931  [id number]  Primary Physician [name], MD Primary Cardiologist: Lorretta Harp MD FACP, [place], [place], [place]  HPI:  [name] [name] is a 85 y.o.  overweight African-American male who I last saw on 06/28/2014 for evaluation of claudication and leg pain.  His primary cardiologist is Dr. Acie Fredrickson.  He has a history of hypertension, hyperlipidemia, diabetes and CAD. His last cardiac catheterization performed 10/05/08 by Dr. Diona Browner and revealed an occluded circumflex marginal branch at the site of the previous stent placed in May 2010. He did undergo angioplasty to that vessel in Sp[place] 2010 . His ejection fraction was 50-55% with lateral wall hypokinesia. He has claudication there is bilateral and lifestyle limiting to less than 100 feet. Doppler studies performed her office suggest high-grade bilateral mid SFA disease. Dr. Gwenlyn Found performed abdominal aortography with bifemoral runoff on 12/07/12 revealing two-vessel runoff below the knee bilaterally, long 90% segment segmental calcified mid right SFA stenosis and a chronic total occlusion of the mid left SFA which he attempted to cross unsuccessfully. The patient had a nuclear stress test on 11/17/12 which revealed a large, mostly fixed defect. There may be some periinfarct ischemia. This is in a territory of previously described MI and angioplasty and was considered intermediate risk. LVEF 40% with inferolateral hypokinesis.  The patient presented to the ER with chest pain which he says is similar to the pain in 2010 when he had the stent. [name] He reports it having CP for 2-3 weeks which got worse with exertion . Yesterday it was 10/10 with radiation to both shoulders neck and arm. It was associated with nausea and SOB. He has had one negative troponin POC at 2046hrs yesterday. EKG with TWI in I, aVL which are not new but at 0500hrs today he had acute ST depression in V 2-5. CT angio was negative for PE.  No history of GIB. The patient denies vomiting, fever, orthopnea, dizziness, PND, abdominal pain, hematochezia, melena, lower extremity edema, claudication.  He had dynamic T wave inversion in the lateral leads and ultimately evolved positive troponins. Because of this he was brought to the Cath Lab for diagnostic arctic catheterization via the right radial approach which showed three-vessel disease with mild decrease in LV function. 2 days later he underwent coronary artery bypass grafting x4 by Dr. Erasmo Leventhal the LIMA to his LAD, vein to obtuse marginal branch and sequential vein to acute marginal and PDA appears postop course was uncomplicated. He is rehabilitating at home. Since I last saw him in the office a year ago he does complain of lifestyle limiting claudication. [name] follows his coronary artery disease and other  Risk factors.  Since I saw him 4 years ago he continues to have lifestyle limiting bilateral calf claudication.  Recent lower extremity arterial Doppler studies performed 12/08/2018 revealed a right ABI 0.60 and a left ABI of 0.73, pretty much unchanged from his prior studies.  I did not think he had revascularizable options when I last saw him.   Current Meds  Medication Sig  . allopurinol (ZYLOPRIM) 100 MG tablet Take 2 tablets (200 mg total) by mouth daily.  [name] amLODipine (NORVASC) 10 MG tablet Take 1 tablet (10 mg total) by mouth daily.  [name] aspirin EC 81 MG tablet Take 1 tablet (81 mg total) by mouth daily.  [name] atorvastatin (LIPITOR) 40 MG tablet Take 1 tablet by mouth once daily  . Blood Glucose Monitoring Suppl (ONETOUCH VERIO  FLEX SYSTEM) w/Device KIT Use OneTouch Verio Flex meter to check blood sugar twice daily.  . colchicine 0.6 MG tablet Take 1 tablet (0.6 mg total) by mouth 2 (two) times daily.  [name] gabapentin (NEURONTIN) 100 MG capsule Take 1 capsule (100 mg total) by mouth 3 (three) times daily.  . insulin NPH Human (HUMULIN N,NOVOLIN N) 100 UNIT/ML  injection Inject 20 Units into the skin 2 (two) times daily.  . Insulin Regular Human (NOVOLIN R RELION IJ) Inject 15 Units as directed 2 (two) times daily before a meal.  . Insulin Syringe-Needle U-100 31G X 15/64" 0.3 ML MISC Use to inject insulin 4 times a day  . loratadine (ALLERGY RELIEF) 10 MG tablet Take 1 tablet (10 mg total) by mouth daily as needed for rhinitis.  [name] losartan (COZAAR) 50 MG tablet Take 1 tablet by mouth once daily  . metFORMIN (GLUCOPHAGE-XR) 500 MG 24 hr tablet TAKE 2 TABLETS BY MOUTH ONCE DAILY WITH SUPPER  . metoprolol tartrate (LOPRESSOR) 25 MG tablet Take 1 tablet (25 mg total) by mouth 2 (two) times daily.  [name] omeprazole (PRILOSEC) 20 MG capsule Take 1 capsule (20 mg total) by mouth daily.  Glory Rosebush DELICA LANCETS FINE MISC Use to check blood sugar 2 times per day dx code E11.49  . ONETOUCH VERIO test strip USE  STRIP TO CHECK GLUCOSE TWICE DAILY  . oxybutynin (DITROPAN) 5 MG tablet Take 1 tablet (5 mg total) by mouth 3 (three) times daily.  . tamsulosin (FLOMAX) 0.4 MG CAPS capsule Take 1 capsule (0.4 mg total) by mouth daily.     No Known Allergies  Social History   Socioeconomic History  . Marital status: Married    Spouse name: Not on file  . Number of children: 4  . Years of education: 63  . Highest education level: 12th grade  Occupational History  . 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 Financial resource strain: Very hard  . Food insecurity    Worry: Never true    Inability: Never true  . Transportation needs    Medical: No    Non-medical: No  Tobacco Use  . Smoking status: Former Smoker    Packs/day: 0.75    Years: 20.00    Pack years: 15.00    Types: Cigarettes    Quit date: 07/08/1980    Years since quitting: 38.4  . Smokeless tobacco: Never Used  Substance and Sexual Activity  . Alcohol use: No    Alcohol/week: 0.0 standard drinks    Comment: 12/07/2012 "quit drinking alcohol > 25 yr  ago"  . Drug use: No  . Sexual activity: Not Currently  Lifestyle  . Physical activity    Days per week: 0 days    Minutes per session: 0 min  . Stress: Not at all  Relationships  . Social Herbalist on phone: Not on file    Gets together: Not on file    Attends religious service: More than 4 times per year    Active member of club or organization: Yes    Attends meetings of clubs or organizations: Not on file    Relationship status: Married  . Intimate partner violence    Fear of current or ex partner: No    Emotionally abused: No    Physically abused: No    Forced sexual activity: No  Other Topics Concern  . Not on file  Social History Narrative   Occupation: Company secretary   Married     Review of Systems: General: negative for chills, fever, night sweats or weight changes.  Cardiovascular: negative for chest pain, dyspnea on exertion, edema, orthopnea, palpitations, paroxysmal nocturnal dyspnea or shortness of breath Dermatological: negative for rash Respiratory: negative for cough or wheezing Urologic: negative for hematuria Abdominal: negative for nausea, vomiting, diarrhea, bright red blood per rectum, melena, or hematemesis Neurologic: negative for visual changes, syncope, or dizziness All other systems reviewed and are otherwise negative except as noted above.    Blood pressure (!) 152/81, pulse 66, height '6\' 1"'$  (1.854 m), weight 270 lb (122.5 kg), SpO2 97 %.  General appearance: alert and no distress Neck: no adenopathy, no carotid bruit, no JVD, supple, symmetrical, trachea midline and thyroid not enlarged, symmetric, no tenderness/mass/nodules Lungs: clear to auscultation bilaterally Heart: regular rate and rhythm, S1, S2 normal, no murmur, click, rub or gallop Extremities: extremities normal, atraumatic, no cyanosis or edema Pulses: Absent pedal pulses bilaterally Skin: Skin color, texture, turgor normal. No rashes or lesions Neurologic: Alert and  oriented X 3, normal strength and tone. Normal symmetric reflexes. Normal coordination and gait  EKG sinus rhythm at 66 without ST or T wave changes.  I personally reviewed this EKG.  ASSESSMENT AND PLAN:   Peripheral arterial disease ([place]) History of peripheral arterial disease status post peripheral angiography and intervention by myself 12/07/2012 with demonstration of occlusion of the left SFA and high-grade calcified segmental mid right SFA with 0 vessel runoff bilaterally.  I was unsuccessful in crossing his left SFA occlusion.  And thought that his anatomy was not revascularizable.  I recommended medical therapy with Pletal.  He seen back by Robbie Lis PAC on 12/02/2018 complaining of calf claudication and was referred here for further evaluation.  I am going to begin him on Pletal 50 p.o. twice daily.  If he does not have any clinical benefit I do not think there are any other endovascular surgical options that we could attempt to try.      Lorretta Harp MD FACP,FACC,FAHA, [facility] 12/15/2018 1:42 PM

## 2018-12-16 ENCOUNTER — Other Ambulatory Visit: Payer: Self-pay | Admitting: Endocrinology

## 2018-12-17 ENCOUNTER — Other Ambulatory Visit: Payer: Self-pay | Admitting: Endocrinology

## 2018-12-29 ENCOUNTER — Telehealth: Payer: Self-pay | Admitting: Cardiovascular Disease

## 2018-12-29 ENCOUNTER — Other Ambulatory Visit: Payer: Self-pay | Admitting: Cardiovascular Disease

## 2018-12-29 NOTE — Telephone Encounter (Signed)
Attempted to call patient to advise him that his medication refill was sent today. There was no answer on his phone and no voice mail.

## 2018-12-29 NOTE — Telephone Encounter (Signed)
New Message  Pt c/o medication issue:  1. Name of Medication: amLODipine (NORVASC) 10 MG tablet  2. How are you currently taking this medication (dosage and times per day)? As written  3. Are you having a reaction (difficulty breathing--STAT)? No  4. What is your medication issue? Out of medication; no more refills

## 2019-01-24 ENCOUNTER — Other Ambulatory Visit: Payer: Self-pay | Admitting: Endocrinology

## 2019-01-25 ENCOUNTER — Other Ambulatory Visit: Payer: Self-pay | Admitting: Cardiovascular Disease

## 2019-02-02 ENCOUNTER — Other Ambulatory Visit: Payer: Self-pay | Admitting: Endocrinology

## 2019-02-02 DIAGNOSIS — E1142 Type 2 diabetes mellitus with diabetic polyneuropathy: Secondary | ICD-10-CM

## 2019-02-07 ENCOUNTER — Other Ambulatory Visit: Payer: Medicare Other

## 2019-02-08 ENCOUNTER — Other Ambulatory Visit: Payer: Self-pay

## 2019-02-10 ENCOUNTER — Encounter: Payer: Self-pay | Admitting: Endocrinology

## 2019-02-10 ENCOUNTER — Other Ambulatory Visit: Payer: Self-pay | Admitting: *Deleted

## 2019-02-10 ENCOUNTER — Ambulatory Visit (INDEPENDENT_AMBULATORY_CARE_PROVIDER_SITE_OTHER): Payer: Medicare (Managed Care) | Admitting: Endocrinology

## 2019-02-10 VITALS — BP 140/62 | HR 65 | Ht 73.0 in | Wt 270.4 lb

## 2019-02-10 DIAGNOSIS — M109 Gout, unspecified: Secondary | ICD-10-CM

## 2019-02-10 DIAGNOSIS — E1151 Type 2 diabetes mellitus with diabetic peripheral angiopathy without gangrene: Secondary | ICD-10-CM

## 2019-02-10 DIAGNOSIS — I10 Essential (primary) hypertension: Secondary | ICD-10-CM

## 2019-02-10 DIAGNOSIS — E1142 Type 2 diabetes mellitus with diabetic polyneuropathy: Secondary | ICD-10-CM

## 2019-02-10 DIAGNOSIS — E7800 Pure hypercholesterolemia, unspecified: Secondary | ICD-10-CM | POA: Diagnosis not present

## 2019-02-10 LAB — COMPREHENSIVE METABOLIC PANEL
ALT: 18 U/L (ref 0–53)
AST: 18 U/L (ref 0–37)
Albumin: 3.9 g/dL (ref 3.5–5.2)
Alkaline Phosphatase: 99 U/L (ref 39–117)
BUN: 17 mg/dL (ref 6–23)
CO2: 29 mEq/L (ref 19–32)
Calcium: 9.3 mg/dL (ref 8.4–10.5)
Chloride: 104 mEq/L (ref 96–112)
Creatinine, Ser: 1.34 mg/dL (ref 0.40–1.50)
GFR: 61.25 mL/min (ref >60.00)
Glucose, Bld: 143 mg/dL — ABNORMAL HIGH (ref 70–99)
Potassium: 3.6 mEq/L (ref 3.5–5.1)
Sodium: 139 mEq/L (ref 135–145)
Total Bilirubin: 0.6 mg/dL (ref 0.2–1.2)
Total Protein: 7.1 g/dL (ref 6.0–8.3)

## 2019-02-10 LAB — POCT GLYCOSYLATED HEMOGLOBIN (HGB A1C): Hemoglobin A1C: 7.2 % — AB (ref 4.0–5.6)

## 2019-02-10 NOTE — Progress Notes (Signed)
Patient ID: [name] [name], male   DOB: 21-Mar-1931, 86 y.o.   MRN: [id number]   Reason for Appointment: diabetes follow-up  History of Present Illness     DIABETES type II:  Diagnosis date: 1990  Previous history: He has been on insulin since 2005 Previously on Lantus and also subsequently basal bolus regimen but this was changed to NPH and regular insulin because of cost his A1c has been usually near 7% in the past He was switched from Lantus to NPH because of cost  Recent history:   INSULIN REGIMEN:    NOVOLIN-N -Relion 15 units before breakfast and 15 units supper Relion/ Regular Insulin 15 units twice daily before breakfast and dinner     Oral hypoglycemic drugs: Metformin ER 500 mg -- 2 in p.m.      Current blood sugar patterns and problems identified:   His A1c is relatively higher at 7.2, was 6.8  He has checked his sugars regularly recently  However he appears to have higher fasting readings compared to his last visit and not clear why  Has not gained back some of the weight that he has lost previously  As before is not very active  He thinks he is taking his insulin consistently at breakfast and dinnertime and prefers to take both his insulin types at the same time  Not clear if any of his readings are after dinner in the evening or whether they are higher  Also on an average his blood sugars are about 180 both morning and evening but appears to have relatively high readings in the mornings  No hypoglycemia  On his prescription his Metformin is listed as 2 tablets at dinnertime although in the past had no side effects from higher dose .          Monitors blood glucose: <1 times a day.    Glucometer:   One Touch Verio        Blood Glucose readings from download:   PRE-MEAL Fasting Lunch  late afternoon Bedtime Overall  Glucose range:  149-223   136-232    Mean/median:  182   182   182   POST-MEAL PC Breakfast PC Lunch PC Dinner  Glucose  range:    203, 141  Mean/median:      PREVIOUS readings:   PRE-MEAL Fasting Lunch Dinner Bedtime Overall  Glucose range:  98-205   111-228    Mean/median:  159   183   171       Meals: 2-3 meals per day.  breakfast is eggs,oatmeal usually.   Breakfast 7 AM, lunch 1 PM and dinner 6-7                 Dietician visit: Most recent:  02/2017     Weight control:   Wt Readings from Last 3 Encounters:  02/10/19 270 lb 6.4 oz (122.7 kg)  12/15/18 270 lb (122.5 kg)  12/02/18 269 lb 6.4 oz (122.2 kg)           Diabetes labs:  Lab Results  Component Value Date   HGBA1C 7.2 (A) 02/10/2019   HGBA1C 6.8 (A) 11/03/2018   HGBA1C 7.8 (H) 07/28/2018   Lab Results  Component Value Date   MICROALBUR 11.7 (H) 11/04/2018   LDLCALC 34 11/04/2018   CREATININE 1.34 11/04/2018     Allergies as of 02/10/2019   No Known Allergies     Medication List       Accurate as of  February 10, 2019 12:03 PM. If you have any questions, ask your nurse or doctor.        allopurinol 100 MG tablet Commonly known as: ZYLOPRIM Take 2 tablets (200 mg total) by mouth daily.   amLODipine 10 MG tablet Commonly known as: NORVASC Take 1 tablet by mouth once daily   aspirin EC 81 MG tablet Take 1 tablet (81 mg total) by mouth daily.   atorvastatin 40 MG tablet Commonly known as: LIPITOR Take 1 tablet by mouth once daily   cilostazol 50 MG tablet Commonly known as: PLETAL Take 1 tablet (50 mg total) by mouth 2 (two) times daily.   colchicine 0.6 MG tablet Take 1 tablet (0.6 mg total) by mouth 2 (two) times daily.   furosemide 20 MG tablet Commonly known as: LASIX Take 1 tablet (20 mg total) by mouth daily.   gabapentin 100 MG capsule Commonly known as: NEURONTIN Take 1 capsule (100 mg total) by mouth 3 (three) times daily.   insulin NPH Human 100 UNIT/ML injection Commonly known as: NOVOLIN N Inject 15 Units into the skin 2 (two) times daily.   Insulin Syringe-Needle U-100 31G X 15/64" 0.3  ML Misc Use to inject insulin 4 times a day   loratadine 10 MG tablet Commonly known as: Allergy Relief Take 1 tablet (10 mg total) by mouth daily as needed for rhinitis.   losartan 50 MG tablet Commonly known as: COZAAR Take 1 tablet by mouth once daily   metFORMIN 500 MG 24 hr tablet Commonly known as: GLUCOPHAGE-XR TAKE 2 TABLETS BY MOUTH ONCE DAILY WITH SUPPER   metoprolol tartrate 25 MG tablet Commonly known as: LOPRESSOR Take 1 tablet by mouth twice daily   NOVOLIN R RELION IJ Inject 15 Units as directed 2 (two) times daily before a meal.   omeprazole 20 MG capsule Commonly known as: PRILOSEC Take 1 capsule (20 mg total) by mouth daily.   OneTouch Delica Lancets Fine Misc Use to check blood sugar 2 times per day dx code E11.49   OneTouch Verio Flex System w/Device Kit Use OneTouch Verio Flex meter to check blood sugar twice daily.   OneTouch Verio test strip Generic drug: glucose blood USE AS DIRECTED TO CHECK BLOOD SUGAR TWICE DAILY   oxybutynin 5 MG tablet Commonly known as: DITROPAN Take 1 tablet (5 mg total) by mouth 3 (three) times daily.   tamsulosin 0.4 MG Caps capsule Commonly known as: FLOMAX Take 1 capsule (0.4 mg total) by mouth daily.       Allergies: No Known Allergies  Past Medical History:  Diagnosis Date  . Benign prostatic hyperplasia (BPH) with urinary urge incontinence 11/13/2017  . Chronic left shoulder pain 11/13/2017  . Chronic non-seasonal allergic rhinitis 02/17/2006  . Chronic systolic heart failure ([place]) 10/31/2016   Echo (05/03/2015): LVEF 34-28%, grade 2 diastolic dysfunction  . 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 Essential hypertension 11/04/2005  .  Gastroesophageal reflux disease with hiatal hernia 11/04/2005  . History of prostate cancer 02/17/2006   Diagnosed 1995, s/p seed implantation in the late 1990's, PSA undetectable 04/10/2016  . Hyperlipidemia 11/04/2005  . Obesity (BMI 30.0-34.9) 11/13/2017  . Peripheral arterial disease ([place]) 09/30/2007   with claudication, failed attempt at LLE PTCA  .  Type 2 diabetes mellitus with microalbuminuria, with long-term current use of insulin ([place]) 11/13/2017  . Type 2 diabetes mellitus with mild 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, bilateral ([place]) 03/24/2018  . Type 2 diabetes mellitus with peripheral neuropathy ([place]) 11/04/2005  . Urethral stricture in male 11/13/2017   Noted on cystoscopy 12/18/2014.  Reportedly asked to do chronic intermittent self catheterizations, but has not.    Past Surgical History:  Procedure Laterality Date  . CARDIAC CATHETERIZATION  10/05/08   REVEALS HYPOKINESIS OF THE LATERAL WALL AND EF 50-55%  . CORONARY ANGIOPLASTY WITH STENT PLACEMENT    . CORONARY ARTERY BYPASS GRAFT N/A 01/05/2013   Procedure: CORONARY ARTERY BYPASS GRAFTING (CABG);  Surgeon: Melrose Nakayama, MD;  Location: [place];  Service: Open Heart Surgery;  Laterality: N/A;  . INTRAOPERATIVE TRANSESOPHAGEAL ECHOCARDIOGRAM N/A 01/05/2013   Procedure: INTRAOPERATIVE TRANSESOPHAGEAL ECHOCARDIOGRAM;  Surgeon: Melrose Nakayama, MD;  Location: [place];  Service: Open Heart Surgery;  Laterality: N/A;  . LEFT HEART CATHETERIZATION WITH CORONARY ANGIOGRAM N/A 01/03/2013   Procedure: LEFT HEART CATHETERIZATION WITH CORONARY ANGIOGRAM;  Surgeon: Lorretta Harp, MD;  Location: [facility] CATH LAB;  Service: Cardiovascular;  Laterality: N/A;  . LOWER EXTREMITY ANGIOGRAM Right 12/07/2012   unsuccessful attempt at percutaneous revascularization of a calcified long segment chronic total occlusion mid left SFA /notes 12/07/2012  . LOWER EXTREMITY ANGIOGRAM N/A 12/07/2012   Procedure: LOWER EXTREMITY  ANGIOGRAM;  Surgeon: Lorretta Harp, MD;  Location[facility] C[facility] LAB;  Service: Cardiovascular;  Laterality: N/A;  . PROSTATE SURGERY  1990's    Family History  Problem Relation Age of Onset  . Kidney failure Mother   . Hypertension Father   . Stroke Father   . Other Sister   . Other Brother   . Other Brother   . Other Brother   . Other Brother   . Other Brother   . Other Brother   . Other Brother   . Other Brother   . Healthy Daughter   . Healthy Daughter   . Healthy Daughter   . Healthy Son     Social History:  reports that he quit smoking about 38 years ago. His smoking use included cigarettes. He has a 15.00 pack-year smoking history. He has never used smokeless tobacco. He reports that he does not drink alcohol or use drugs.  Review of Systems:   Hypertension:  has had long-standing hypertension followed by his PCP and other physicians Last BP was higher but appears better today  BP Readings from Last 3 Encounters:  02/10/19 140/62  12/15/18 (!) 152/81  12/02/18 116/70     Lipids: Has been treated with Lipitor 40 mg Followed by cardiologist with the following results   Lab Results  Component Value Date   CHOL 86 11/04/2018   HDL 28.00 (L) 11/04/2018   LDLCALC 34 11/04/2018   TRIG 120.0 11/04/2018   CHOLHDL 3 11/04/2018    Neuropathy: Has been treated with gabapentin 100 mg by his PCP  Claudication: Has pain in the lower legs when he is walking, more on the left calf, followed by cardiologist     Foot exam done in 02/2019, has absent pedal pulses otherwise normal monofilament sensation   RENAL dysfunction: This is mild and stable  He does not have microalbuminuria  Lab Results  Component Value Date   CREATININE 1.34 11/04/2018   CREATININE 1.34 07/28/2018   CREATININE 1.36 (H) 06/01/2018   He also has had an episode of gout  in 2020     LABS:  Office Visit on 02/10/2019  Component Date Value Ref Range Status  . Hemoglobin A1C 02/10/2019 7.2*  4.0 - 5.6 % Final     Examination:   BP 140/62 (BP Location: Left Arm, Patient Position: Sitting, Cuff Size: Large)   Pulse 65   Ht '6\' 1"'$  (1.854 m)   Wt 270 lb 6.4 oz (122.7 kg)   SpO2 98%   BMI 35.67 kg/m   Body mass index is 35.67 kg/m.   Diabetic Foot Exam - Simple   Simple Foot Form Diabetic Foot exam was performed with the following findings: Yes 02/10/2019 10:32 AM  Visual Inspection No deformities, no ulcerations, no other skin breakdown bilaterally: Yes Sensation Testing Intact to touch and monofilament testing bilaterally: Yes Pulse Check See comments: Yes Comments Absent pedal pulses       ASSESSMENT/ PLAN:     Diabetes type 2 insulin-dependent:   His A1c is 7.2   See history of present illness for detailed discussion of current diabetes management, blood sugar patterns and problems identified  Although he is having reasonably good control for his age and comorbid conditions his blood sugars are averaging about 180 now They appear to be mostly high fasting with variable results in the afternoon and early evening He thinks he is compliant with his insulin doses Has been unable to maintain his weight and he thinks he is still trying to cut back on portions at mealtimes May have occasionally high readings depending on amount of carbohydrates are occasionally drinking more juice  He prefers to take NPH and regular insulin to reduce his overall cost of insulin  Only change in insulin today would be to move his NPH from suppertime to bedtime to get better fasting readings and reduce potential overnight hypoglycemia Also since his renal function is fairly good he can benefit from 1500 mg of Metformin Also because of his symptoms of chronic constipation he can switch to regular Metformin instead of extended release on his next prescription Asked him to get a refill on his test strips since he has not been checking his glucose lately  HYPERTENSION: Blood pressure is  well controlled as of today  PERIPHERAL vascular disease: Symptoms are stable Foot exam is satisfactory  LIPIDS: Were excellent on the last visit    Patient Instructions  At suppertime take only the clear regular insulin 15 units  Cloudy insulin, Novolin N will be moved to BEDTIME instead of suppertime, continue with 15 units dosage.  Continue both shots together in the morning and before  Start taking another tablet of 500 mg Metformin at breakfast board.  When you finish the current prescription for Metformin we will change you from your extended release Metformin to the regular Metformin, please call for prescription        Elayne Snare 02/10/2019, 12:03 PM

## 2019-02-10 NOTE — Patient Instructions (Addendum)
At suppertime take only the clear regular insulin 15 units  Cloudy insulin, Novolin N will be moved to BEDTIME instead of suppertime, continue with 15 units dosage.  Continue both shots together in the morning and before  Start taking another tablet of 500 mg Metformin at breakfast board.  When you finish the current prescription for Metformin we will change you from your extended release Metformin to the regular Metformin, please call for prescription

## 2019-02-11 ENCOUNTER — Ambulatory Visit: Payer: Medicare Other | Admitting: Internal Medicine

## 2019-02-11 MED ORDER — COLCHICINE 0.6 MG PO TABS: 0.6000 mg | ORAL_TABLET | Freq: Two times a day (BID) | ORAL | 3 refills | Status: DC

## 2019-02-11 MED ORDER — GABAPENTIN 100 MG PO CAPS: 100.0000 mg | ORAL_CAPSULE | Freq: Three times a day (TID) | ORAL | 5 refills | Status: DC

## 2019-02-25 ENCOUNTER — Encounter: Payer: Self-pay | Admitting: Internal Medicine

## 2019-02-25 ENCOUNTER — Encounter: Payer: Medicare Other | Admitting: Internal Medicine

## 2019-02-25 ENCOUNTER — Other Ambulatory Visit: Payer: Self-pay

## 2019-02-25 ENCOUNTER — Ambulatory Visit (INDEPENDENT_AMBULATORY_CARE_PROVIDER_SITE_OTHER): Payer: Medicare (Managed Care) | Admitting: Internal Medicine

## 2019-02-25 DIAGNOSIS — I5042 Chronic combined systolic (congestive) and diastolic (congestive) heart failure: Secondary | ICD-10-CM

## 2019-02-25 DIAGNOSIS — I5022 Chronic systolic (congestive) heart failure: Secondary | ICD-10-CM

## 2019-02-25 DIAGNOSIS — K5900 Constipation, unspecified: Secondary | ICD-10-CM

## 2019-02-25 DIAGNOSIS — E785 Hyperlipidemia, unspecified: Secondary | ICD-10-CM

## 2019-02-25 DIAGNOSIS — N3941 Urge incontinence: Secondary | ICD-10-CM

## 2019-02-25 DIAGNOSIS — E669 Obesity, unspecified: Secondary | ICD-10-CM

## 2019-02-25 DIAGNOSIS — K449 Diaphragmatic hernia without obstruction or gangrene: Secondary | ICD-10-CM

## 2019-02-25 DIAGNOSIS — R350 Frequency of micturition: Secondary | ICD-10-CM

## 2019-02-25 DIAGNOSIS — M1A9XX0 Chronic gout, unspecified, without tophus (tophi): Secondary | ICD-10-CM

## 2019-02-25 DIAGNOSIS — I2510 Atherosclerotic heart disease of native coronary artery without angina pectoris: Secondary | ICD-10-CM | POA: Diagnosis not present

## 2019-02-25 DIAGNOSIS — E1140 Type 2 diabetes mellitus with diabetic neuropathy, unspecified: Secondary | ICD-10-CM

## 2019-02-25 DIAGNOSIS — Z7982 Long term (current) use of aspirin: Secondary | ICD-10-CM

## 2019-02-25 DIAGNOSIS — R140 Abdominal distension (gaseous): Secondary | ICD-10-CM

## 2019-02-25 DIAGNOSIS — E1142 Type 2 diabetes mellitus with diabetic polyneuropathy: Secondary | ICD-10-CM

## 2019-02-25 DIAGNOSIS — M109 Gout, unspecified: Secondary | ICD-10-CM | POA: Diagnosis not present

## 2019-02-25 DIAGNOSIS — Z6836 Body mass index (BMI) 36.0-36.9, adult: Secondary | ICD-10-CM

## 2019-02-25 DIAGNOSIS — Z794 Long term (current) use of insulin: Secondary | ICD-10-CM

## 2019-02-25 DIAGNOSIS — I110 Hypertensive heart disease with heart failure: Secondary | ICD-10-CM | POA: Diagnosis not present

## 2019-02-25 DIAGNOSIS — K219 Gastro-esophageal reflux disease without esophagitis: Secondary | ICD-10-CM

## 2019-02-25 DIAGNOSIS — N401 Enlarged prostate with lower urinary tract symptoms: Secondary | ICD-10-CM

## 2019-02-25 DIAGNOSIS — I10 Essential (primary) hypertension: Secondary | ICD-10-CM

## 2019-02-25 DIAGNOSIS — Z79899 Other long term (current) drug therapy: Secondary | ICD-10-CM

## 2019-02-25 DIAGNOSIS — I739 Peripheral vascular disease, unspecified: Secondary | ICD-10-CM

## 2019-02-25 MED ORDER — COLCHICINE 0.6 MG PO TABS: 0.6000 mg | ORAL_TABLET | Freq: Two times a day (BID) | ORAL | 3 refills | Status: DC | PRN

## 2019-02-25 MED ORDER — TAMSULOSIN HCL 0.4 MG PO CAPS: 0.4000 mg | ORAL_CAPSULE | Freq: Every day | ORAL | 3 refills | Status: DC

## 2019-02-25 MED ORDER — PANTOPRAZOLE SODIUM 40 MG PO TBEC: 40.0000 mg | DELAYED_RELEASE_TABLET | Freq: Every day | ORAL | 3 refills | Status: DC

## 2019-02-25 MED ORDER — POLYETHYLENE GLYCOL 3350 17 G PO PACK: 17.0000 g | PACK | Freq: Every day | ORAL | 3 refills | Status: DC

## 2019-02-25 MED ORDER — SENNOSIDES-DOCUSATE SODIUM 8.6-50 MG PO TABS: 2.0000 | ORAL_TABLET | Freq: Every day | ORAL | 1 refills | Status: DC | PRN

## 2019-02-25 MED ORDER — FUROSEMIDE 20 MG PO TABS: 20.0000 mg | ORAL_TABLET | Freq: Every day | ORAL | 3 refills | Status: DC

## 2019-02-25 NOTE — Patient Instructions (Signed)
[name] [name] - -  Thank you for coming to see me today!  I will be happy to take over care for your diabetes.  Please send all refills to our office.   For your urinary issues - - Please start taking your Tamsulosin 0.4mg  daily.   For your reflux - - Please stop the omeprazole and START Protonix (pantoprazole) 40mg  daily.   For your constipation - -  Please do the following:  Take Miralax (polyethylene glycol) 17 g daily with breakfast Take Senna 2 tablets daily in the evening.   Once your bowel movements are soft and occurring every 1-2 days, stop the senna and continue the miralax only.    Please call me if this does not work to help with your constipation.   Thank you!  Come back in 3 months.

## 2019-02-25 NOTE — Progress Notes (Signed)
Subjective:    Patient ID: [name] [name], male    DOB: [birth year], 86 y.o.   MRN: [id number]  CC: Follow up for HTN, CAD, gout  HPI  [name] [name] is an 86 year old man with PMH of PAD, CAD, Chronic combined heart failure, GERD, BPH, HLD, gout, HTN who presents for follow up.   [name] [name] reported a few issues today - -  First, he has breakthrough reflux and does not feel that the omeprazole is helping him.  He notes that he will get heartburn and then take the medication.  We went over proper timing of the medication.   Second, he notes dribbling and having to go to the bathroom regularly and having to wear adult incontenence products.  We discussed his medications and he has been prescribed tamsulosin but he was not taking it as he did not know what it is for.  He will start taking it and see if this helps.  If not, he may need Urology referral.    Third, he is dealing with constipation, which is also making his reflux worse.  He currently is only taking epsom salts when it gets bad.  He goes about 5 days between bowel movements and the stool is very hard and like pellets when he does go.  No diarrhea, no blood that he noted.    We discussed some of his chronic issues as well. He has not had an issue with gout.  He is taking colchicine daily and I advised him to go back to PRN only.  He is not having any SOB or leg swelling.  His leg pain related to PAD is well controlled.    Review of Systems  Constitutional: Negative for activity change, appetite change and unexpected weight change.  Respiratory: Positive for cough (states related to reflux). Negative for shortness of breath and wheezing.   Cardiovascular: Negative for chest pain, palpitations and leg swelling.  Gastrointestinal: Positive for abdominal distention and constipation. Negative for abdominal pain, anal bleeding, diarrhea, nausea, rectal pain and vomiting.  Genitourinary: Positive for difficulty urinating, enuresis,  frequency and urgency.  Musculoskeletal: Positive for gait problem (uses cane). Negative for arthralgias.  Neurological: Negative for dizziness and weakness.  Psychiatric/Behavioral: Negative for decreased concentration and dysphoric mood.       Objective:   Physical Exam Vitals and nursing note reviewed.  Constitutional:      General: He is not in acute distress.    Appearance: Normal appearance. He is obese. He is not toxic-appearing.  HENT:     Head: Normocephalic and atraumatic.  Cardiovascular:     Rate and Rhythm: Normal rate and regular rhythm.     Heart sounds: No murmur.  Pulmonary:     Effort: Pulmonary effort is normal. No respiratory distress.     Breath sounds: Normal breath sounds. No wheezing.  Abdominal:     General: There is distension.     Palpations: Abdomen is soft. There is no mass.     Tenderness: There is no abdominal tenderness. There is no guarding or rebound.     Hernia: No hernia is present.     Comments: Bowel sounds present, but decreased, present in all 4 quadrants, no fluid wave that I could elicit.    Musculoskeletal:        General: No swelling or tenderness.  Skin:    General: Skin is warm and dry.  Neurological:     Mental Status: He is alert and oriented  to person, place, and time. Mental status is at baseline.  Psychiatric:        Mood and Affect: Mood normal.        Behavior: Behavior normal.    No labs today, recently done on 02/10/19       Assessment & Plan:  Return to clinic in 3 months.

## 2019-02-26 NOTE — Assessment & Plan Note (Signed)
Doing well on cilostozol.  He does not report any worsening leg pain.  He is taking his aspirin and other medications for CAD as well.  BP is well controlled.   Plan Continue statin, pletal.

## 2019-02-26 NOTE — Assessment & Plan Note (Signed)
We discussed this today.  He feels his weight is mostly related to constipation.  See constipation issue.  Will continue to address weight and discuss proper nutrition.

## 2019-02-26 NOTE — Assessment & Plan Note (Signed)
We reviewed the appropriate way to take the medication, it seems he may have been taking it after food.  Regardless, he is interested in trying a different medication and we will switch to protonix.   Plan STOP omeprazole START protonix 40mg  daily.  Reviewed taking 30 min prior to breakfast.

## 2019-02-26 NOTE — Assessment & Plan Note (Signed)
He has hard stools.  Has been taking epsom salts only.  We discussed taking daily miralax and senna for 2 weeks and then miralax alone.  He had previously tried docusate without improvement.   Follow up at next visit.  Likely related to his oxybutynin.

## 2019-02-26 NOTE — Assessment & Plan Note (Signed)
He denies any chest pain.  He is on aspirin, statin, pletal and good blood pressure control.   Plan Continue to monitor Continue medications.

## 2019-02-26 NOTE — Assessment & Plan Note (Signed)
Well controlled at this time.  Last uric acid 5.5.   Continue allopurinol.  Colchicine PRN

## 2019-02-26 NOTE — Assessment & Plan Note (Signed)
He notes continued dribbling and need to go to the bathroom.  He did not know his tamsulosin was for this issue.  We discussed the nature of the issue with an enlarged prostate and how this medication can help.  He is amenable to taking it.   Plan Restart Tamsulosin 0.4mg  daily

## 2019-02-26 NOTE — Assessment & Plan Note (Signed)
Last A1C 7.2 which is well controlled.  He is on insulin and metformin.  He requests transition of his DM care to our clinic, which we will do.  He was recently seen by his endocrinologist and had A1C.  We will address this at next visit.   Follow up in 3 months.

## 2019-02-26 NOTE — Assessment & Plan Note (Signed)
He is doing well, complaints of no SOB, chest pain, etc.  He is taking his lasix as prescribed.  He does have abdominal distention, but no fluid wave, no edema.  I think this distention is related to another issue.   Plan Continue lasix, aspirin, statin.

## 2019-02-26 NOTE — Assessment & Plan Note (Signed)
BP is well controlled today at 131/59.  He brought in his medication and the only med he is not taking is tamsulosin.  We discussed this at length.  He will start taking.   Plan Continue amlodipine, losartan, metoprolol Cr last checked 2 weeks ago stable at baseline known CKD.

## 2019-03-22 ENCOUNTER — Ambulatory Visit: Payer: Medicare Other | Admitting: Cardiovascular Disease

## 2019-03-31 ENCOUNTER — Ambulatory Visit: Payer: Medicare (Managed Care) | Attending: Internal Medicine

## 2019-03-31 DIAGNOSIS — Z23 Encounter for immunization: Secondary | ICD-10-CM

## 2019-03-31 NOTE — Progress Notes (Signed)
   Covid-19 Vaccination Clinic  Name:  [name] [name]    MRN: [id number] DOB: 01-07-1932  03/31/2019  [name] was observed post Covid-19 immunization for 15 minutes without incidence. He was provided with Vaccine Information Sheet and instruction to access the V-Safe system.   [name] was instructed to call 911 with any severe reactions post vaccine: [name] Difficulty breathing  . Swelling of your face and throat  . A fast heartbeat  . A bad rash all over your body  . Dizziness and weakness    Immunizations Administered    Name Date Dose VIS Date Route   Pfizer COVID-19 Vaccine 03/31/2019  9:51 AM 0.3 mL 01/14/2019 Intramuscular   Manufacturer: [place]   Lot: [id number]   Day: [id number]

## 2019-04-07 ENCOUNTER — Other Ambulatory Visit: Payer: Self-pay | Admitting: Endocrinology

## 2019-04-08 ENCOUNTER — Other Ambulatory Visit: Payer: Self-pay | Admitting: Cardiovascular Disease

## 2019-04-08 NOTE — Telephone Encounter (Signed)
New Message   *STAT* If patient is at the pharmacy, call can be transferred to refill team.   1. Which medications need to be refilled? (please list name of each medication and dose if known) metFORMIN (GLUCOPHAGE-XR) 500 MG 24 hr tablet  2. Which pharmacy/location (including street and city if local pharmacy) is medication to be sent to? [place], [address]  3. Do they need a 30 day or 90 day supply? 90 day

## 2019-04-20 ENCOUNTER — Ambulatory Visit: Payer: Medicare (Managed Care) | Attending: Internal Medicine

## 2019-04-20 DIAGNOSIS — Z23 Encounter for immunization: Secondary | ICD-10-CM

## 2019-04-20 NOTE — Progress Notes (Signed)
   Covid-19 Vaccination Clinic  Name:  [name] [name]    MRN: [id number] DOB: 12-19-1931  04/20/2019  [name] [name] was observed post Covid-19 immunization for 15 minutes without incident. He was provided with Vaccine Information Sheet and instruction to access the V-Safe system.   [name] [name] was instructed to call 911 with any severe reactions post vaccine: [name] Difficulty breathing  . Swelling of face and throat  . A fast heartbeat  . A bad rash all over body  . Dizziness and weakness   Immunizations Administered    Name Date Dose VIS Date Route   Pfizer COVID-19 Vaccine 04/20/2019 10:51 AM 0.3 mL 01/14/2019 Intramuscular   Manufacturer: [place]   Lot: [id number]   [place]: [id number]

## 2019-04-27 ENCOUNTER — Other Ambulatory Visit: Payer: Self-pay | Admitting: Internal Medicine

## 2019-04-27 DIAGNOSIS — J3089 Other allergic rhinitis: Secondary | ICD-10-CM

## 2019-04-27 MED ORDER — LORATADINE 10 MG PO TABS: 10.0000 mg | ORAL_TABLET | Freq: Every day | ORAL | 1 refills | Status: DC | PRN

## 2019-04-27 NOTE — Telephone Encounter (Signed)
Refill Request  loratadine (ALLERGY RELIEF) 10 MG tablet  [facility] [place], [place] - [place]

## 2019-05-11 ENCOUNTER — Encounter: Payer: Self-pay | Admitting: Endocrinology

## 2019-05-11 ENCOUNTER — Other Ambulatory Visit: Payer: Self-pay

## 2019-05-11 ENCOUNTER — Ambulatory Visit (INDEPENDENT_AMBULATORY_CARE_PROVIDER_SITE_OTHER): Payer: Medicare (Managed Care) | Admitting: Endocrinology

## 2019-05-11 VITALS — BP 130/58 | HR 76 | Ht 73.0 in | Wt 278.0 lb

## 2019-05-11 DIAGNOSIS — I10 Essential (primary) hypertension: Secondary | ICD-10-CM

## 2019-05-11 DIAGNOSIS — E1165 Type 2 diabetes mellitus with hyperglycemia: Secondary | ICD-10-CM

## 2019-05-11 DIAGNOSIS — E7800 Pure hypercholesterolemia, unspecified: Secondary | ICD-10-CM

## 2019-05-11 DIAGNOSIS — Z794 Long term (current) use of insulin: Secondary | ICD-10-CM | POA: Diagnosis not present

## 2019-05-11 DIAGNOSIS — E1142 Type 2 diabetes mellitus with diabetic polyneuropathy: Secondary | ICD-10-CM | POA: Diagnosis not present

## 2019-05-11 LAB — COMPREHENSIVE METABOLIC PANEL
ALT: 25 U/L (ref 0–53)
AST: 25 U/L (ref 0–37)
Albumin: 4 g/dL (ref 3.5–5.2)
Alkaline Phosphatase: 94 U/L (ref 39–117)
BUN: 16 mg/dL (ref 6–23)
CO2: 30 mEq/L (ref 19–32)
Calcium: 9.2 mg/dL (ref 8.4–10.5)
Chloride: 107 mEq/L (ref 96–112)
Creatinine, Ser: 1.43 mg/dL (ref 0.40–1.50)
GFR: 56.79 mL/min — ABNORMAL LOW (ref >60.00)
Glucose, Bld: 150 mg/dL — ABNORMAL HIGH (ref 70–99)
Potassium: 4.1 mEq/L (ref 3.5–5.1)
Sodium: 141 mEq/L (ref 135–145)
Total Bilirubin: 0.6 mg/dL (ref 0.2–1.2)
Total Protein: 7 g/dL (ref 6.0–8.3)

## 2019-05-11 LAB — POCT GLYCOSYLATED HEMOGLOBIN (HGB A1C): Hemoglobin A1C: 7.5 % — AB (ref 4.0–5.6)

## 2019-05-11 NOTE — Progress Notes (Signed)
Patient ID: [name] [name], male   DOB: 1931/11/05, 86 y.o.   MRN: [id number]   Reason for Appointment: diabetes follow-up  History of Present Illness     DIABETES type II:  Diagnosis date: 1990  Previous history: He has been on insulin since 2005 Previously on Lantus and also subsequently basal bolus regimen but this was changed to NPH and regular insulin because of cost his A1c has been usually near 7% in the past He was switched from Lantus to NPH because of cost  Recent history:   INSULIN REGIMEN:    NOVOLIN-N -Relion 15 units before breakfast and 15 units supper Relion/ Regular Insulin 15 units twice daily before breakfast and dinner     Oral hypoglycemic drugs: Metformin ER 500 mg -- 2 in p.m.      Current blood sugar patterns and problems identified:   His A1c is relatively higher at 7.5 and will gradually  He now says that he is usually eating a sandwich at lunchtime like a chicken salad sandwich but not taking any insulin at that time  Taking insulin only twice a day before breakfast and dinner  He is only able to measure his insulin approximately and his dose is difficult to determine, not clear if he is taking 15 or 20 units as he gives variable responses  Also for simplicity again he takes the same insulin dose both morning and insulin about the types of insulin  FASTING blood sugars are mostly fairly good especially in the last week but occasionally over 200  However not checking readings after dinner at night  Except for a couple of readings in his blood sugars are usually high in the late afternoon/early evening time but not clear if some of these readings are after eating  He is regular with Metformin  No hypoglycemia His weight is gradually going up this year, as before is not able to do much walking without any leg pain .          Monitors blood glucose: <1 times a day.    Glucometer:   One Touch Verio        Blood Glucose readings  from download:   PRE-MEAL Fasting Lunch Dinner Bedtime Overall  Glucose range:  103-255   115-284    Mean/median:  156   208   188   Previous readings:  PRE-MEAL Fasting Lunch  late afternoon Bedtime Overall  Glucose range:  149-223   136-232    Mean/median:  182   182   182   POST-MEAL PC Breakfast PC Lunch PC Dinner  Glucose range:    203, 141  Mean/median:          Meals: 2-3 meals per day.  breakfast is eggs,oatmeal usually.   Breakfast 7 AM, lunch 1 PM and dinner 6-7                 Dietician visit: Most recent:  02/2017     Weight control:   Wt Readings from Last 3 Encounters:  05/11/19 278 lb (126.1 kg)  02/25/19 276 lb 9.6 oz (125.5 kg)  02/10/19 270 lb 6.4 oz (122.7 kg)           Diabetes labs:  Lab Results  Component Value Date   HGBA1C 7.5 (A) 05/11/2019   HGBA1C 7.2 (A) 02/10/2019   HGBA1C 6.8 (A) 11/03/2018   Lab Results  Component Value Date   MICROALBUR 11.7 (H) 11/04/2018   [place] 34 11/04/2018  CREATININE 1.34 02/10/2019     Allergies as of 05/11/2019   No Known Allergies     Medication List       Accurate as of May 11, 2019  1:58 PM. If you have any questions, ask your nurse or doctor.        STOP taking these medications   polyethylene glycol 17 g packet Commonly known as: MIRALAX / GLYCOLAX Stopped by: Elayne Snare, MD   senna-docusate 8.6-50 MG tablet Commonly known as: Senokot-S Stopped by: Elayne Snare, MD     TAKE these medications   allopurinol 100 MG tablet Commonly known as: ZYLOPRIM Take 2 tablets (200 mg total) by mouth daily.   amLODipine 10 MG tablet Commonly known as: NORVASC Take 1 tablet by mouth once daily   aspirin EC 81 MG tablet Take 1 tablet (81 mg total) by mouth daily.   atorvastatin 40 MG tablet Commonly known as: LIPITOR Take 1 tablet by mouth once daily   cilostazol 50 MG tablet Commonly known as: PLETAL Take 1 tablet (50 mg total) by mouth 2 (two) times daily.   colchicine 0.6 MG tablet  Take 1 tablet (0.6 mg total) by mouth 2 (two) times daily as needed.   furosemide 20 MG tablet Commonly known as: LASIX Take 1 tablet (20 mg total) by mouth daily.   gabapentin 100 MG capsule Commonly known as: NEURONTIN Take 1 capsule (100 mg total) by mouth 3 (three) times daily.   insulin NPH Human 100 UNIT/ML injection Commonly known as: NOVOLIN N Inject 15 Units into the skin 2 (two) times daily.   Insulin Syringe-Needle U-100 31G X 15/64" 0.3 ML Misc Use to inject insulin 4 times a day   loratadine 10 MG tablet Commonly known as: Allergy Relief Take 1 tablet (10 mg total) by mouth daily as needed for rhinitis.   losartan 50 MG tablet Commonly known as: COZAAR Take 1 tablet by mouth once daily   metFORMIN 500 MG 24 hr tablet Commonly known as: GLUCOPHAGE-XR TAKE 2 TABLETS BY MOUTH ONCE DAILY WITH SUPPER   metoprolol tartrate 25 MG tablet Commonly known as: LOPRESSOR Take 1 tablet by mouth twice daily   NOVOLIN R RELION IJ Inject 15 Units as directed 2 (two) times daily before a meal.   OneTouch Delica Lancets Fine Misc Use to check blood sugar 2 times per day dx code E11.49   OneTouch Verio Flex System w/Device Kit Use OneTouch Verio Flex meter to check blood sugar twice daily.   OneTouch Verio test strip Generic drug: glucose blood USE AS DIRECTED TO CHECK BLOOD SUGAR TWICE DAILY   oxybutynin 5 MG tablet Commonly known as: DITROPAN Take 1 tablet (5 mg total) by mouth 3 (three) times daily.   pantoprazole 40 MG tablet Commonly known as: Protonix Take 1 tablet (40 mg total) by mouth daily.   tamsulosin 0.4 MG Caps capsule Commonly known as: FLOMAX Take 1 capsule (0.4 mg total) by mouth daily.       Allergies: No Known Allergies  Past Medical History:  Diagnosis Date  . Benign prostatic hyperplasia (BPH) with urinary urge incontinence 11/13/2017  . Chronic left shoulder pain 11/13/2017  . Chronic non-seasonal allergic rhinitis 02/17/2006  .  Chronic systolic heart failure ([place]) 10/31/2016   Echo (05/03/2015): LVEF 79-02%, grade 2 diastolic dysfunction  . Coronary artery disease involving native coronary artery of native heart without angina pectoris 09/30/2007   s/p CABG on 01/05/2013: left internal mammary artery to left anterior descending,  saphenous vein graft to obtuse marginal 1, sequential saphenous vein graft to acute marginal and posterior descending  . Essential hypertension 11/04/2005  . Gastroesophageal reflux disease with hiatal hernia 11/04/2005  . History of prostate cancer 02/17/2006   Diagnosed 1995, s/p seed implantation in the late 1990's, PSA undetectable 04/10/2016  . Hyperlipidemia 11/04/2005  . Obesity (BMI 30.0-34.9) 11/13/2017  . Peripheral arterial disease ([place]) 09/30/2007   with claudication, failed attempt at LLE PTCA  . Type 2 diabetes mellitus with microalbuminuria, with long-term current use of insulin ([place]) 11/13/2017  . Type 2 diabetes mellitus with mild 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, bilateral ([place]) 03/24/2018  . Type 2 diabetes mellitus with peripheral neuropathy ([place]) 11/04/2005  . Urethral stricture in male 11/13/2017   Noted on cystoscopy 12/18/2014.  Reportedly asked to do chronic intermittent self catheterizations, but has not.    Past Surgical History:  Procedure Laterality Date  . CARDIAC CATHETERIZATION  10/05/08   REVEALS HYPOKINESIS OF THE LATERAL WALL AND EF 50-55%  . CORONARY ANGIOPLASTY WITH STENT PLACEMENT    . CORONARY ARTERY BYPASS GRAFT N/A 01/05/2013   Procedure: CORONARY ARTERY BYPASS GRAFTING (CABG);  Surgeon: Melrose Nakayama, MD;  Location: [facility];  Service: Open Heart Surgery;  Laterality: N/A;  . INTRAOPERATIVE TRANSESOPHAGEAL ECHOCARDIOGRAM N/A 01/05/2013   Procedure: INTRAOPERATIVE TRANSESOPHAGEAL ECHOCARDIOGRAM;  Surgeon: Melrose Nakayama, MD;  Location: [facility];  Service: Open Heart Surgery;  Laterality: N/A;  . LEFT HEART CATHETERIZATION WITH  CORONARY ANGIOGRAM N/A 01/03/2013   Procedure: LEFT HEART CATHETERIZATION WITH CORONARY ANGIOGRAM;  Surgeon: Lorretta Harp, MD;  Location[facility] C[facility] LAB;  Service: Cardiovascular;  Laterality: N/A;  . LOWER EXTREMITY ANGIOGRAM Right 12/07/2012   unsuccessful attempt at percutaneous revascularization of a calcified long segment chronic total occlusion mid left SFA /notes 12/07/2012  . LOWER EXTREMITY ANGIOGRAM N/A 12/07/2012   Procedure: LOWER EXTREMITY ANGIOGRAM;  Surgeon: Lorretta Harp, MD;  Location: [facility] CATH LAB;  Service: Cardiovascular;  Laterality: N/A;  . PROSTATE SURGERY  1990's    Family History  Problem Relation Age of [place]  . Kidney failure Mother   . Hypertension Father   . Stroke Father   . Other Sister   . Other Brother   . Other Brother   . Other Brother   . Other Brother   . Other Brother   . Other Brother   . Other Brother   . Other Brother   . Healthy Daughter   . Healthy Daughter   . Healthy Daughter   . Healthy Son     Social History:  reports that he quit smoking about 38 years ago. His smoking use included cigarettes. He has a 15.00 pack-year smoking history. He has never used smokeless tobacco. He reports that he does not drink alcohol or use drugs.  Review of Systems:   Hypertension:  has had long-standing hypertension followed by his PCP and other physicians Blood pressure recently well controlled  BP Readings from Last 3 Encounters:  05/11/19 (!) 130/58  02/25/19 (!) 131/59  02/10/19 140/62     Lipids: Has been treated with Lipitor 40 mg Usually has lipid levels well below target   Lab Results  Component Value Date   CHOL 86 11/04/2018   HDL 28.00 (L) 11/04/2018   LDLCALC 34 11/04/2018   TRIG 120.0 11/04/2018   CHOLHDL 3 11/04/2018    Neuropathy: Has been treated with gabapentin 100 mg by his PCP  Claudication: Has pain in the lower  legs when he is walking, more on the left calf, followed by cardiologist     Foot exam done in  02/2019, has absent pedal pulses otherwise normal monofilament sensation   RENAL dysfunction: This is mild and stable  He does not have microalbuminuria  Lab Results  Component Value Date   CREATININE 1.34 02/10/2019   CREATININE 1.34 11/04/2018   CREATININE 1.34 07/28/2018       LABS:  Office Visit on 05/11/2019  Component Date Value Ref Range Status  . Hemoglobin A1C 05/11/2019 7.5* 4.0 - 5.6 % Final     Examination:   BP (!) 130/58 (BP Location: Left Arm, Patient Position: Sitting, Cuff Size: Large)   Pulse 76   Ht '6\' 1"'$  (1.854 m)   Wt 278 lb (126.1 kg)   SpO2 98%   BMI 36.68 kg/m   Body mass index is 36.68 kg/m.     ASSESSMENT/ PLAN:     Diabetes type 2 insulin-dependent:   His A1c is 7.5, previously 7.2   See history of present illness for detailed discussion of current diabetes management, blood sugar patterns and problems identified  He has frequent blood sugars over 200 in the afternoon likely be from eating sandwiches at lunchtime Unclear whether he has any high readings at lunchtime because he does not check these  He is likely gaining weight from being less active and probably getting more snacks and larger portions.   His diet may also affect his fasting readings at times which can be sometimes high but he has generally fairly good  He prefers to take NPH and regular insulin to reduce his overall cost of insulin He is measuring his insulin in increments of 5 units but not clear if he is sometimes taking 20 units instead of 15 on his insulin regimen  Since he has significantly high readings between lunch and dinner will have him start taking 10 units of regular insulin at lunchtime to cover his sandwiches He will need to take his insulin with him if he is planning to eat out Check more readings at lunchtime also at least on the days he is home To call if he is having any tendency to low sugars  HYPERTENSION: Blood pressure is consistently controlled   Mild renal dysfunction not related to diabetes: Creatinine 1.34 and will need to be rechecked today especially since he is on Metformin  LIPIDS: Generally well controlled    Patient Instructions  Take 10 units R insulin , clear shot before sandwich at lunch  Also check some sugars at lunch  Call if sugars get below [place] 05/11/2019, 1:58 PM

## 2019-05-11 NOTE — Patient Instructions (Addendum)
Take 10 units R insulin , clear shot before sandwich at lunch  Also check some sugars at lunch  Call if sugars get below 80

## 2019-05-13 ENCOUNTER — Ambulatory Visit: Payer: Medicare (Managed Care) | Admitting: Internal Medicine

## 2019-05-17 ENCOUNTER — Other Ambulatory Visit: Payer: Self-pay | Admitting: Endocrinology

## 2019-05-18 ENCOUNTER — Other Ambulatory Visit: Payer: Self-pay | Admitting: Internal Medicine

## 2019-05-18 DIAGNOSIS — M109 Gout, unspecified: Secondary | ICD-10-CM

## 2019-05-18 NOTE — Telephone Encounter (Signed)
Needs refill on   colchicine 0.6 MG tablet    pt contact [phone]

## 2019-05-19 MED ORDER — COLCHICINE 0.6 MG PO TABS: 0.6000 mg | ORAL_TABLET | Freq: Two times a day (BID) | ORAL | 3 refills | Status: DC | PRN

## 2019-05-20 ENCOUNTER — Telehealth: Payer: Self-pay

## 2019-05-20 ENCOUNTER — Other Ambulatory Visit: Payer: Self-pay | Admitting: Physician Assistant

## 2019-05-20 NOTE — Telephone Encounter (Signed)
furosemide (LASIX) 20 MG tablet   atorvastatin (LIPITOR) 40 MG tablet   gabapentin (NEURONTIN) 100 MG capsule, refill request @  [place], [place] [phone] (Phone) [phone] (Fax)

## 2019-05-20 NOTE — Telephone Encounter (Signed)
Patient has refills on all these meds. Confirmed with Elder Negus at [facility] they have these Rxs and will get them ready for patient. Hubbard Hartshorn, BSN, RN-BC

## 2019-05-27 ENCOUNTER — Ambulatory Visit (INDEPENDENT_AMBULATORY_CARE_PROVIDER_SITE_OTHER): Payer: Medicare (Managed Care) | Admitting: Cardiovascular Disease

## 2019-05-27 ENCOUNTER — Other Ambulatory Visit: Payer: Self-pay

## 2019-05-27 ENCOUNTER — Encounter: Payer: Self-pay | Admitting: Cardiovascular Disease

## 2019-05-27 VITALS — BP 134/68 | HR 60 | Ht 73.0 in | Wt 276.2 lb

## 2019-05-27 DIAGNOSIS — I5022 Chronic systolic (congestive) heart failure: Secondary | ICD-10-CM | POA: Diagnosis not present

## 2019-05-27 DIAGNOSIS — E7800 Pure hypercholesterolemia, unspecified: Secondary | ICD-10-CM

## 2019-05-27 DIAGNOSIS — I2510 Atherosclerotic heart disease of native coronary artery without angina pectoris: Secondary | ICD-10-CM

## 2019-05-27 DIAGNOSIS — I5042 Chronic combined systolic (congestive) and diastolic (congestive) heart failure: Secondary | ICD-10-CM

## 2019-05-27 DIAGNOSIS — I739 Peripheral vascular disease, unspecified: Secondary | ICD-10-CM

## 2019-05-27 DIAGNOSIS — E782 Mixed hyperlipidemia: Secondary | ICD-10-CM

## 2019-05-27 NOTE — Progress Notes (Signed)
[name] [name] Date of Birth  January 03, 1932 [facility] / [facility] [address]     [place], [place]  [address] [phone]  Fax  [phone]   Problem List 1. CAD - status post stenting and   status post coronary artery bypass grafting 2. Peripheral vascular disease 3. Diabetes mellitus 4. Hypertension 5. Hyperlipidemia    [name] [name] is a 86 y.o. gentleman with a Hx of CAD - s/p stenting, diabetes mellitus, hypertension and hyperlipidemia. He presents today with the complaint of midsternal chest pain. This typically occurs after he eats some food and then goes out and doesn't work. It resolves after about 30 minutes.  He did not take any nitroglycerin. He took some Prilosec and it eventually resolved.   Jun 29, 2013:  [name] [name] has had CABG since I last saw him.    His CABG was complicated by several things - hematura and now he has urinary incontinence and wears depends.  Nov. 24, 2015:  [name] [name] is s/p CABG. He has CAD, DM and now has urninary incontinence.   Has seen Dr. Jeffie Pollock who does not think there are any options for his incontence.   September 18, 2014:  Doing well.  Has some indigestion .   Has seen Dr. Gwenlyn Found for peripheral vascular disease.  He high-grade segmental right and total left SFA stenosis with single-vessel runoff below the knees bilaterally.   Dr. Gwenlyn Found attempted unsuccessfully to percutaneously revascularize his left SFA.  Feb. 13, 2017:  No CP , No dyspnea  Dr. Gwenlyn Found was not able to revascularized his left SFA.    Has leg pain with walking - right > left ,  Aug. 16, 2017:  [name] [name] is seen today for follow-up visit. He has a history of coronary artery disease and peripheral vascular disease. Has seen Dr. Gwenlyn Found - attempted SFA revascularization but it was unsuccessful.   No cardiac complaints. Has significant claudication with any walking .  Has gotten some relief with gabapentin - causes a dry  mouth  Feb. 12, 2018:  Still eating some salty foods.  Some hot dogs, canned food, sausage  His blood pressure has been a little elevated and he has been having headaches on a regular basis.  Sept. 28, 2018:  [name] [name]  is feeling better. He's cut out a lot of his salty foods. Blood pressure is well-controlled. He thinks that one of his meds is causing a dry mouth may be causing him to have a dry mouth.   May 19, 2017  [name] [name] is doing well .  No CP or dyspnea.    Some pain in right leg with walking - if he goes to far.  Able to do most of his usual activities without too much trouble   November 25, 2017:  [name] [name] is seen today for follow-up visit.  He has a history of coronary artery disease and peripheral vascular disease. He has a history of hypertension and hyperlipidemia. No CP or dyspnea.   Has left shoulder pain when the weather is cold .  Has some claudicatin when he walks.  May 27, 2019: [name] [name] is seen today for follow-up of his coronary artery disease and peripheral vascular disease.  He also has hypertension and hyperlipidemia.  No CP ,  Occasional leg pain  Has indigestion .   Takes pantoprozole  With relief.  Occurs after he eats. , no with exertion   Current Outpatient Medications on File Prior to Visit  Medication Sig Dispense Refill  .  allopurinol (ZYLOPRIM) 100 MG tablet Take 2 tablets (200 mg total) by mouth daily. 180 tablet 1  . amLODipine (NORVASC) 10 MG tablet Take 1 tablet by mouth once daily 90 tablet 3  . aspirin EC 81 MG tablet Take 1 tablet (81 mg total) by mouth daily.    [name] atorvastatin (LIPITOR) 40 MG tablet Take 1 tablet by mouth once daily 90 tablet 3  . Blood Glucose Monitoring Suppl (ONETOUCH VERIO FLEX SYSTEM) w/Device KIT Use OneTouch Verio Flex meter to check blood sugar twice daily. 1 kit 0  . cilostazol (PLETAL) 50 MG tablet Take 1 tablet (50 mg total) by mouth 2 (two) times daily. 180 tablet 3  . colchicine 0.6 MG tablet Take  1 tablet (0.6 mg total) by mouth 2 (two) times daily as needed. 60 tablet 3  . furosemide (LASIX) 20 MG tablet Take 1 tablet by mouth once daily 90 tablet 1  . gabapentin (NEURONTIN) 100 MG capsule Take 1 capsule (100 mg total) by mouth 3 (three) times daily. 90 capsule 5  . insulin NPH Human (HUMULIN N,NOVOLIN N) 100 UNIT/ML injection Inject 15 Units into the skin 2 (two) times daily.     . Insulin Regular Human (NOVOLIN R RELION IJ) Inject 15 Units as directed 2 (two) times daily before a meal.    . Insulin Syringe-Needle U-100 31G X 15/64" 0.3 ML MISC Use to inject insulin 4 times a day 200 each 7  . loratadine (ALLERGY RELIEF) 10 MG tablet Take 1 tablet (10 mg total) by mouth daily as needed for rhinitis. 90 tablet 1  . losartan (COZAAR) 50 MG tablet Take 1 tablet by mouth once daily 90 tablet 0  . metFORMIN (GLUCOPHAGE-XR) 500 MG 24 hr tablet TAKE 2 TABLETS BY MOUTH ONCE DAILY WITH SUPPER 180 tablet 0  . metoprolol tartrate (LOPRESSOR) 25 MG tablet Take 1 tablet by mouth twice daily 180 tablet 3  . ONETOUCH DELICA LANCETS FINE MISC Use to check blood sugar 2 times per day dx code E11.49 100 each 3  . ONETOUCH VERIO test strip USE AS DIRECTED TO CHECK BLOOD SUGAR TWICE DAILY 100 each 3  . oxybutynin (DITROPAN) 5 MG tablet Take 1 tablet (5 mg total) by mouth 3 (three) times daily. 270 tablet 1  . pantoprazole (PROTONIX) 40 MG tablet Take 1 tablet (40 mg total) by mouth daily. 90 tablet 3  . tamsulosin (FLOMAX) 0.4 MG CAPS capsule Take 1 capsule (0.4 mg total) by mouth daily. 90 capsule 3   No current facility-administered medications on file prior to visit.    No Known Allergies  Past Medical History:  Diagnosis Date  . Benign prostatic hyperplasia (BPH) with urinary urge incontinence 11/13/2017  . Chronic left shoulder pain 11/13/2017  . Chronic non-seasonal allergic rhinitis 02/17/2006  . Chronic systolic heart failure ([place]) 10/31/2016   Echo (05/03/2015): LVEF 23-36%, grade 2 diastolic  dysfunction  . 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 Essential hypertension 11/04/2005  . Gastroesophageal reflux disease with hiatal hernia 11/04/2005  . History of prostate cancer 02/17/2006   Diagnosed 1995, s/p seed implantation in the late 1990's, PSA undetectable 04/10/2016  . Hyperlipidemia 11/04/2005  . Obesity (BMI 30.0-34.9) 11/13/2017  . Peripheral arterial disease ([place]) 09/30/2007   with  claudication, failed attempt at LLE PTCA  . Type 2 diabetes mellitus with microalbuminuria, with long-term current use of insulin ([place]) 11/13/2017  . Type 2 diabetes mellitus with mild 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, bilateral ([place]) 03/24/2018  . Type 2 diabetes mellitus with peripheral neuropathy ([place]) 11/04/2005  . Urethral stricture in male 11/13/2017   Noted on cystoscopy 12/18/2014.  Reportedly asked to do chronic intermittent self catheterizations, but has not.    Past Surgical History:  Procedure Laterality Date  . CARDIAC CATHETERIZATION  10/05/08   REVEALS HYPOKINESIS OF THE LATERAL WALL AND EF 50-55%  . CORONARY ANGIOPLASTY WITH STENT PLACEMENT    . CORONARY ARTERY BYPASS GRAFT N/A 01/05/2013   Procedure: CORONARY ARTERY BYPASS GRAFTING (CABG);  Surgeon: Melrose Nakayama, MD;  Location: [facility];  Service: Open Heart Surgery;  Laterality: N/A;  . INTRAOPERATIVE TRANSESOPHAGEAL ECHOCARDIOGRAM N/A 01/05/2013   Procedure: INTRAOPERATIVE TRANSESOPHAGEAL ECHOCARDIOGRAM;  Surgeon: Melrose Nakayama, MD;  Location: [facility];  Service: Open Heart Surgery;  Laterality: N/A;  . LEFT HEART CATHETERIZATION WITH CORONARY ANGIOGRAM N/A 01/03/2013   Procedure: LEFT HEART CATHETERIZATION WITH CORONARY ANGIOGRAM;   Surgeon: Lorretta Harp, MD;  Location: [facility] CATH LAB;  Service: Cardiovascular;  Laterality: N/A;  . LOWER EXTREMITY ANGIOGRAM Right 12/07/2012   unsuccessful attempt at percutaneous revascularization of a calcified long segment chronic total occlusion mid left SFA /notes 12/07/2012  . LOWER EXTREMITY ANGIOGRAM N/A 12/07/2012   Procedure: LOWER EXTREMITY ANGIOGRAM;  Surgeon: Lorretta Harp, MD;  Location: [facility] CATH LAB;  Service: Cardiovascular;  Laterality: N/A;  . PROSTATE SURGERY  1990's    Social History   Tobacco Use  Smoking Status Former Smoker  . Packs/day: 0.75  . Years: 20.00  . Pack years: 15.00  . Types: Cigarettes  . Quit date: 07/08/1980  . Years since quitting: 38.9  Smokeless Tobacco Never Used    Social History   Substance and Sexual Activity  Alcohol Use No  . Alcohol/week: 0.0 standard drinks   Comment: 12/07/2012 "quit drinking alcohol > 25 yr ago"    Family History  Problem Relation Age of Onset  . Kidney failure Mother   . Hypertension Father   . Stroke Father   . Other Sister   . Other Brother   . Other Brother   . Other Brother   . Other Brother   . Other Brother   . Other Brother   . Other Brother   . Other Brother   . Healthy Daughter   . Healthy Daughter   . Healthy Daughter   . Healthy Son     Reviw of Systems:  Reviewed in the HPI.  All other systems are negative.   Physical Exam: Blood pressure 134/68, pulse 60, height _0  (1.854 m), weight 276 lb 4 oz (125.3 kg), SpO2 99 %.  GEN:  Well nourished, well developed in no acute distress HEENT: Normal NECK: No JVD; No carotid bruits LYMPHATICS: No lymphadenopathy CARDIAC: RRR , no murmurs, rubs, gallops RESPIRATORY:  Clear to auscultation without rales, wheezing or rhonchi  ABDOMEN: Soft, non-tender, non-distended MUSCULOSKELETAL:  No edema; No deformity  SKIN: Warm and dry NEUROLOGIC:  Alert and oriented x 3   ECG:    Assessment / Plan:   1. CAD -        No angina ,   liipds have looked good.   Repeat in 6 months  . 2. Peripheral vascular disease -       3.  Diabetes mellitus -  Managed by his medical doctor   4. Hypertension -    BP is well controlled.   5. Hyperlipidemia  -     Lipids are well controlled.   Cont atorvastatin  6. Chronic systolic congestive heart failure -  Well controlled.   Cont current meds.     Mertie Moores, MD  05/27/2019 10:58 AM    [address],  [place], [place]  [address] Pager [phone] Phone: [phone]; Fax: [phone]

## 2019-05-27 NOTE — Patient Instructions (Signed)
Medication Instructions:  Your physician recommends that you continue on your current medications as directed. Please refer to the Current Medication list given to you today.  *If you need a refill on your cardiac medications before your next appointment, please call your pharmacy*   Lab Work: Your physician recommends that you return for lab work in: 6 months on the day of or a few days before your office visit with Dr. Acie Fredrickson.  You will need to FAST for this appointment - nothing to eat or drink after midnight the night before except water.   If you have labs (blood work) drawn today and your tests are completely normal, you will receive your results only by: [name] MyChart Message (if you have MyChart) OR . A paper copy in the mail If you have any lab test that is abnormal or we need to change your treatment, we will call you to review the results.   Testing/Procedures: None Ordered   Follow-Up: At [facility], you and your health needs are our priority.  As part of our continuing mission to provide you with exceptional heart care, we have created designated Provider Care Teams.  These Care Teams include your primary Cardiologist (physician) and Advanced Practice Providers (APPs -  Physician Assistants and Nurse Practitioners) who all work together to provide you with the care you need, when you need it.  We recommend signing up for the patient portal called "MyChart".  Sign up information is provided on this After Visit Summary.  MyChart is used to connect with patients for Virtual Visits (Telemedicine).  Patients are able to view lab/test results, encounter notes, upcoming appointments, etc.  Non-urgent messages can be sent to your provider as well.   To learn more about what you can do with MyChart, go to [email or web address].    Your next appointment:   6 month(s)  The format for your next appointment:   Either In Person or Virtual  Provider:   You may see Mertie Moores, MD  or one of the following Advanced Practice Providers on your designated Care Team:    Richardson Dopp, PA-C  [place], [place]  Daune Perch, [place]

## 2019-06-17 MED ORDER — FUROSEMIDE 20 MG PO TABS: 20.0000 mg | ORAL_TABLET | Freq: Every day | ORAL | 1 refills | Status: DC

## 2019-06-17 NOTE — Addendum Note (Signed)
Addended by: Georgiann Cocker on: 06/17/2019 04:45 PM   Modules accepted: Orders

## 2019-06-24 ENCOUNTER — Other Ambulatory Visit: Payer: Self-pay

## 2019-06-24 ENCOUNTER — Encounter: Payer: Self-pay | Admitting: Internal Medicine

## 2019-06-24 ENCOUNTER — Ambulatory Visit (INDEPENDENT_AMBULATORY_CARE_PROVIDER_SITE_OTHER): Payer: Medicare (Managed Care) | Admitting: Internal Medicine

## 2019-06-24 VITALS — BP 147/73 | HR 68 | Temp 98.6°F | Wt 276.9 lb

## 2019-06-24 DIAGNOSIS — I10 Essential (primary) hypertension: Secondary | ICD-10-CM

## 2019-06-24 DIAGNOSIS — E785 Hyperlipidemia, unspecified: Secondary | ICD-10-CM

## 2019-06-24 DIAGNOSIS — E113293 Type 2 diabetes mellitus with mild 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, bilateral: Secondary | ICD-10-CM

## 2019-06-24 DIAGNOSIS — E1142 Type 2 diabetes mellitus with diabetic polyneuropathy: Secondary | ICD-10-CM

## 2019-06-24 DIAGNOSIS — K449 Diaphragmatic hernia without obstruction or gangrene: Secondary | ICD-10-CM

## 2019-06-24 DIAGNOSIS — Z8546 Personal history of malignant neoplasm of prostate: Secondary | ICD-10-CM

## 2019-06-24 DIAGNOSIS — I739 Peripheral vascular disease, unspecified: Secondary | ICD-10-CM

## 2019-06-24 DIAGNOSIS — M1A9XX0 Chronic gout, unspecified, without tophus (tophi): Secondary | ICD-10-CM

## 2019-06-24 DIAGNOSIS — N3941 Urge incontinence: Secondary | ICD-10-CM

## 2019-06-24 DIAGNOSIS — N401 Enlarged prostate with lower urinary tract symptoms: Secondary | ICD-10-CM

## 2019-06-24 DIAGNOSIS — E7800 Pure hypercholesterolemia, unspecified: Secondary | ICD-10-CM

## 2019-06-24 DIAGNOSIS — Z794 Long term (current) use of insulin: Secondary | ICD-10-CM

## 2019-06-24 DIAGNOSIS — K219 Gastro-esophageal reflux disease without esophagitis: Secondary | ICD-10-CM

## 2019-06-24 MED ORDER — ONETOUCH VERIO FLEX SYSTEM W/DEVICE KIT: PACK | 0 refills | Status: DC

## 2019-06-24 NOTE — Patient Instructions (Signed)
[name] [name] - -  Please start coming to see me for diabetes care if you wish.  I will order you a new meter today since yours quit working.    We will also get you to see our diabetes educator, [name].    Please start taking your protonix every day.   Please schedule to see [facility] for your vision.   Thank you!  Come back to see me in 2 months (July)

## 2019-06-24 NOTE — Progress Notes (Signed)
   Subjective:    Patient ID: [name], male    DOB: 13-Oct-1931, 86 y.o.   MRN: [id number]  CC: 4 month follow up for HTN  HPI  Mr. Connelley is an 86 year old man with PMH of CAD/PVD/chronic HFrEF, DM2 with neuropathy, HLD, HTN, GERD who presents for follow up.   Mr. Laible follows with Dr. Acie Fredrickson for Cardiology needs.  He was last seen on 4/23.  No changes to medications were made.   Mr. Paola follows with Dr. Dwyane Dee for diabetes care.  He had added lunchtime insulin for him.  He is on gabapentin for neuropathy.   Today, Mr. Ebron reports that he would like to transition his diabetes care to [place].  He feels that Dr. [name] office is not the right approach for him.  I advised him to make an appointment in 2 months to discuss his DM2 since he just saw Dr. Dwyane Dee.  He will see Dr. [place] for eye care.  He needs a new meter which will be ordered for him.    He is having breakthrough reflux which he notes is because he only takes his protonix a few times a week in a PRN fashion.  We discussed that taking it daily, before eating, would help with the breakthrough.  Also discussed switching to a medicine that works in a more PRN fashion.   He has a history of prostate cancer, treated.  He has not followed with his urologist in years.    Review of Systems  Constitutional: Negative for activity change and appetite change.  Respiratory: Positive for chest tightness (due to reflux, gets better with protonix). Negative for cough and shortness of breath.   Cardiovascular: Negative for chest pain and leg swelling.  Genitourinary: Negative for difficulty urinating and enuresis.  Musculoskeletal: Negative for arthralgias and back pain.  Neurological: Negative for dizziness and weakness.  Psychiatric/Behavioral: Negative for decreased concentration and dysphoric mood.       Objective:   Physical Exam Vitals and nursing note reviewed.  Constitutional:      General: He is not in acute  distress.    Appearance: He is obese. He is not toxic-appearing.  Eyes:     General: No scleral icterus.       Right eye: No discharge.        Left eye: No discharge.  Cardiovascular:     Rate and Rhythm: Normal rate.  Pulmonary:     Effort: Pulmonary effort is normal. No respiratory distress.  Abdominal:     Comments: Overweight.   Musculoskeletal:        General: No swelling or tenderness.  Neurological:     Mental Status: He is alert. Mental status is at baseline.  Psychiatric:        Mood and Affect: Mood normal.        Behavior: Behavior normal.     CBC and PSA today.       Assessment & Plan:  Return in 2 months for DM care.

## 2019-06-25 ENCOUNTER — Other Ambulatory Visit: Payer: Self-pay | Admitting: Endocrinology

## 2019-06-25 LAB — CBC
Hematocrit: 41.9 % (ref 37.5–51.0)
Hemoglobin: 14.2 g/dL (ref 13.0–17.7)
MCH: 31.2 pg (ref 26.6–33.0)
MCHC: 33.9 g/dL (ref 31.5–35.7)
MCV: 92 fL (ref 79–97)
Platelets: 182 10*3/uL (ref 150–450)
RBC: 4.55 x10E6/uL (ref 4.14–5.80)
RDW: 13.3 % (ref 11.6–15.4)
WBC: 10.9 10*3/uL — ABNORMAL HIGH (ref 3.4–10.8)

## 2019-06-25 LAB — PSA: Prostate Specific Ag, Serum: <0.1 ng/mL (ref 0.0–4.0)

## 2019-06-28 NOTE — Assessment & Plan Note (Signed)
He is no longer following with his urologist.  Last PSA from 2020.  Check PSA today.

## 2019-06-28 NOTE — Assessment & Plan Note (Signed)
He has had no gout attacks since last visit.  He is on chronic allopurinol and colchicine PRN.  Continue current therapy.

## 2019-06-28 NOTE — Assessment & Plan Note (Signed)
BP today is elevated, 147/73.  It has been controlled since January on current therapy.  Continue current therapy.

## 2019-06-28 NOTE — Assessment & Plan Note (Signed)
Also with a history of prostate cancer.  Continue oxybutynin and tamsulosin. Check PSA today.

## 2019-06-28 NOTE — Assessment & Plan Note (Signed)
Follows with Dr. Dwyane Dee.  Last seen on 4/7.  He is on NPH BID and regular insulin with meals along with metformin.  His LDL is 34 on atorvastatin.  He is due to see Dr. [place] for eye care.  He will start following with me for diabetes care.  Appointment in 2 months.

## 2019-06-28 NOTE — Assessment & Plan Note (Signed)
We discussed protonix and how it works.  He is willing to take it daily and will start doing that to see if his reflux improves.    Plan Daily protonix.

## 2019-06-28 NOTE — Assessment & Plan Note (Signed)
Follows with Dr. Acie Fredrickson.  He is on aspirin and cilostozol.  No change to symptoms today.

## 2019-06-28 NOTE — Assessment & Plan Note (Signed)
LDL at last check was 34.   Plan: Continue atorvastatin.

## 2019-06-28 NOTE — Assessment & Plan Note (Signed)
Due to see Dr. [place].  Follow up as planned.

## 2019-06-30 ENCOUNTER — Telehealth: Payer: Self-pay | Admitting: Internal Medicine

## 2019-06-30 NOTE — Telephone Encounter (Signed)
Called [name] [name].  Confirmed his identity with name and DOB.   Gave him the results of his blood work.  We will check a CBC at next visit to ensure WBC returns to WNL.  PSA undetectable.   He reports that there is an issue with his current insurance and would like someone from the front desk to call him back.  Possibly our clinic is not covered within his insurance.    Gilles Chiquito, MD

## 2019-07-08 ENCOUNTER — Encounter: Payer: Self-pay | Admitting: Dietician

## 2019-07-08 ENCOUNTER — Ambulatory Visit (INDEPENDENT_AMBULATORY_CARE_PROVIDER_SITE_OTHER): Payer: BC Managed Care – PPO | Admitting: Dietician

## 2019-07-08 ENCOUNTER — Other Ambulatory Visit: Payer: Self-pay | Admitting: Dietician

## 2019-07-08 DIAGNOSIS — E1142 Type 2 diabetes mellitus with diabetic polyneuropathy: Secondary | ICD-10-CM

## 2019-07-08 DIAGNOSIS — E1129 Type 2 diabetes mellitus with other diabetic kidney complication: Secondary | ICD-10-CM | POA: Diagnosis not present

## 2019-07-08 DIAGNOSIS — R809 Proteinuria, unspecified: Secondary | ICD-10-CM

## 2019-07-08 DIAGNOSIS — Z794 Long term (current) use of insulin: Secondary | ICD-10-CM

## 2019-07-08 NOTE — Progress Notes (Signed)
  Lab Results  Component Value Date   HGBA1C 7.5 (A) 05/11/2019   HGBA1C 7.2 (A) 02/10/2019   HGBA1C 6.8 (A) 11/03/2018   HGBA1C 7.8 (H) 07/28/2018   HGBA1C 9.2 (A) 02/26/2018    Patient verbalized interest in a personal Continuous glucose monitoring. However, he preferred to do professional CGM today to learn more about it and learn how food and beverages choices affect blood sugar.  I reviewed and approve this note and the plan. [name], RD 07/08/2019 12:09 PM.

## 2019-07-08 NOTE — Progress Notes (Signed)
Referral request for Continuous glucose monitoring.  [name], RD 07/08/2019 3:21 PM.

## 2019-07-08 NOTE — Patient Instructions (Signed)
Please record the time, amount and what food drinks and activities you have while wearing the continuous glucose monitor (CGM).  Bring the folder with you to follow up appointments. If your monitor falls off, please place it in the bag provided in your folder and bring it back with you to your next appointment.   Do not have a CT or an MRI while wearing the CGM.   1 week visit has been set up with me and a doctor for the first of two CGM downloads.   You will also return in 2 weeks to have your second download and the CGM removed.  Please bring your medicine and meter to the visit next week.  Thank you!  [name] [phone]

## 2019-07-08 NOTE — Progress Notes (Signed)
Medical Nutrition Therapy Appt start time: 6286 end time: 1100 (45 minutes) Assessment:  Primary concerns today: Blood sugar control and Meal planning.   [name] [name] states that his blood sugars have been running high, in the 200s lately, he states that his fasting blood sugar was 230 mg/dL this morning. He checks his blood glucose twice per day - upon waking in the morning and before dinner in the evening. He states that his blood sugars have been higher than he would like them to be and he would like to discuss strategies to lower his blood sugars. Pt states that he has been taking his insulin, however it was unclear if he has been taking them as directed. Instructed pt to bring medications with him when he returns next week for follow up visit. Discussed with patient his food intake and set goals on how to change his food intake to lower his blood sugar. His goals include:  1) Drink less sweet tea 2) Try splenda instead of regular sugar  Learning Readiness:   Contemplating  Usual eating pattern: 3 meals and 1 snacks per day. Frequent foods and beverages: Water, cherry soda, sweet tea, oatmeal cream pie (cookies), raisin cake.   Avoided foods: Milk, coffee.   Usual physical activity: Did not discuss, would be good to discuss in future visit. Sleep: Did not discuss, would be good to discuss in future visit.  24-hr recall: B ( 8-8:30AM)-  1 egg, 1 package of instant oatmeal with bananas, 1 piece of sausage, water   L ( 12-1PM)- Chicken salad sandwich, orange, water or sweet tea D ( 6:30-7PM)- Cabbage, pinto beans, rice, 2 slices of wheat bread, cherry soda  Snk ( 9-10PM)- Oatmeal cream pie cookie, or raisin cake, water  Typical day? Yes.    Nutritional Diagnosis:  NB-1.1 Food and nutrition-related knowledge deficit As related to lack of prior nutrition education on which foods raise blood sugar levels.  As evidenced by pt questions about which foods to avoid for lower blood sugars.     Educated pt on professional Continuous glucose monitoring and discussed the possibility of obtaining personal continuous glucose monitor. Pt agreed to placing professional CGM today and said that he will think about personal CGM.   Professional Continuous Glucose Sensor: LOT#: [id number] SN#: [id number] Exp. Date: 11/03/2019  Handouts given during visit include:  After-Visit Summary (AVS)   Monitoring/Evaluation:  Dietary intake, professional continuous glucose monitor data, and body weight in 1 week(s) for pro CGM.   [name] [name], Page 07/08/2019 11:49 AM

## 2019-07-14 ENCOUNTER — Other Ambulatory Visit: Payer: Self-pay

## 2019-07-14 ENCOUNTER — Ambulatory Visit (INDEPENDENT_AMBULATORY_CARE_PROVIDER_SITE_OTHER): Payer: BC Managed Care – PPO | Admitting: Dietician

## 2019-07-14 ENCOUNTER — Ambulatory Visit: Payer: BC Managed Care – PPO | Admitting: Dietician

## 2019-07-14 DIAGNOSIS — E1142 Type 2 diabetes mellitus with diabetic polyneuropathy: Secondary | ICD-10-CM

## 2019-07-14 NOTE — Patient Instructions (Addendum)
Good job on your blood sugars!!  Just want to decrease the red at nighttime-   I suggest you decrease the cloudy insulin at night/in the evening.  [name] [phone]

## 2019-07-14 NOTE — Progress Notes (Signed)
Diabetes Self-Management Education  Visit Type: Follow-up (annual)  Appt. Start Time: 1510 Appt. End Time: 2956  07/15/2019  [name], identified by name and date of birth, is a 86 y.o. male with a diagnosis of Diabetes:  Type 2.   ASSESSMENT  [name] [name] reports he is drinking less sugary drinks over the past week.  He brought his medications and his meter today. We reviewed his insulin dosing and other medicine. He is taking them as directed. He says he wants to change to different and less medicine- decrease his  Weight and maintain good blood sugars without low blood sugars. He would like his care coordinated between CDE and doctor. He says he liked a diabetes medicine he used to be on better than what he is on now, but could not tell me the name of it.   He reports being aware of at least one episode in the last week of nocturnal hypoglycemia where he got up drank juice and went back to bed. His CGM showed significant hypoglycemia nocturnally with continually rising with each meal to grater than 180 mg/dl blood sugars during the with a steep drop between 8 PM and midnight.   Continuous glucose monitoring download:    Average is    132  for 7 days   Time sensor is active   100 %   Time in range (70-180 mg/dL):  60 % (Goal >70%)   Time High (181-250 mg/dL)  20 % (Goal < 25%)   Time Very High (>250 mg/dl)  1 % (Goal < 5%)   Time Low (54-69 mg/dL)  10 % (Goal is <4%)   Time Very Low (<54)  9%  (Goal <1%)   Coefficient of variation  41.2% (Goal is <36%)  Patterns noted:   significant hypoglycemia nocturnally with continually rising with each meal to grater than 180 mg/dl blood sugars during the  With a steep drop between 8 Pm and midnight.    Advised a decrease in his PM intermediate acting insulin because of his nocturnal  hypoglycemia. I asked him to see a doctor today and he did not want to. Then I asked hjm to schedule an appointment with his doctor. He asked me to  coordindate his care with her.    Diabetes Self-Management Education - 07/15/19 1100      Visit Information   Visit Type Follow-up   annual     Health Coping   How would you rate your overall health? Good      Psychosocial Assessment   Patient Belief/Attitude about Diabetes Motivated to manage diabetes    Self-care barriers Lack of material resources    Self-management support Doctor's office;Family;CDE visits    Patient Concerns Glycemic Control;Weight Control    Special Needs None    Preferred Learning Style Auditory;Visual;Hands on    [place]    How often do you need to have someone help you when you read instructions, pamphlets, or other written materials from your doctor or pharmacy? 3 - Sometimes      Pre-Education Assessment   Patient understands using medications safely. Needs Review    Patient understands monitoring blood glucose, interpreting and using results Needs Review    Patient understands prevention, detection, and treatment of acute complications. Needs Review      Complications   Last HgB A1C per patient/outside source 7.5 %    Number of hypoglycemic episodes per month --   yes per CGM, no per meter  Dietary Intake   Beverage(s) regular soda and tea      Exercise   Exercise Type ADL's;Light (walking / raking leaves)    How many days per week to you exercise? 7    How many minutes per day do you exercise? 30    Total minutes per week of exercise 210      Patient Education   Medications Reviewed patients medication for diabetes, action, purpose, timing of dose and side effects.    Monitoring Other (comment)   revewied CGM and meter data   Acute complications Taught treatment of hypoglycemia - the 15 rule.      Individualized Goals (developed by patient)   Medications take my medication as prescribed      Outcomes   Expected Outcomes Demonstrated interest in learning. Expect positive outcomes    Future DMSE Other (comment)    1 week   Program Status Not Completed           Individualized Plan for Diabetes Self-Management Training:   Learning Objective:  Patient will have a greater understanding of diabetes self-management. Patient education plan is to attend individual and/or group sessions per assessed needs and concerns.  Plan:   Patient Instructions  Good job on your blood sugars!!  Just want to decrease the red at nighttime-   I suggest you decrease the cloudy insulin at night/in the evening.  [name] [phone]  Expected Outcomes:  Demonstrated interest in learning. Expect positive outcomes Education material provided: Diabetes Resources If problems or questions, patient to contact team via:  Phone Future DSME appointment: Other (comment) (1 week)  [name], RD 07/15/2019 12:19 PM.

## 2019-07-22 ENCOUNTER — Ambulatory Visit (INDEPENDENT_AMBULATORY_CARE_PROVIDER_SITE_OTHER): Payer: BC Managed Care – PPO | Admitting: Dietician

## 2019-07-22 ENCOUNTER — Encounter: Payer: Self-pay | Admitting: Dietician

## 2019-07-22 ENCOUNTER — Ambulatory Visit: Payer: Self-pay | Admitting: Dietician

## 2019-07-22 DIAGNOSIS — E1142 Type 2 diabetes mellitus with diabetic polyneuropathy: Secondary | ICD-10-CM

## 2019-07-22 DIAGNOSIS — R809 Proteinuria, unspecified: Secondary | ICD-10-CM | POA: Diagnosis not present

## 2019-07-22 DIAGNOSIS — E1129 Type 2 diabetes mellitus with other diabetic kidney complication: Secondary | ICD-10-CM | POA: Diagnosis not present

## 2019-07-22 DIAGNOSIS — Z794 Long term (current) use of insulin: Secondary | ICD-10-CM

## 2019-07-22 NOTE — Progress Notes (Signed)
04/07/19 - metformin 90 day supply, missing sometimes  02/24/19 - Tamsulosin 90 day supply, not taking  Oxybutynin - taking 1-2 times per day 03/25/19 90 day supply  Gabapentin 4/13 - 30 day supply, not taking all the time  Allopurinol - 3/12 - 90 day supply, not taking all the time  Losartan - 12/21 - 90 day supply missing doses - advised that this medication is very important for blood pressure  Amlodipine - missing doses, 90 day supply filled 2/22 3/21 metoprolol - missing doses

## 2019-07-22 NOTE — Patient Instructions (Addendum)
Please take the following:      Cloudy insulin    Clear insulin  Before breakfast 15 units        +     15 units  Before supper           10 units   +         10 units   I will ask for pharmacy help.   I will contact a mail order company and they will call you to tell you how much the sensors for a Continuous glucose monitoring will cost.   Please stop at the front desk to make first available appointment with Dr. Daryll Drown and myself.   [name] [phone]  .

## 2019-07-22 NOTE — Progress Notes (Addendum)
Diabetes Self-Management Education  Visit Type: Follow-up  Appt. Start Time: 1015 Appt. End Time: 2585  07/22/2019  [name], identified by name and date of birth, is a 86 y.o. male with a diagnosis of Diabetes:  .   ASSESSMENT  Weight 279 lb 3.2 oz (126.6 kg). Body mass index is 36.84 kg/m.   [name] [name] brought his medications and his meter today.  He stated today that he is having trouble keeping up with his medications and is missing some including metformin. He agreed to a referral (to CCM) for pharmacy assistance.  He was scheduled with Dr. Daryll Drown for August today, [facility] per his request ion July. He wants help finding out how much a personal CGM will cost him  At his pervious visit he was advised a decrease in his PM intermediate acting insulin because of his nocturnal  Hypoglycemia on his first week CGM download. Today he reports doing several different things with his insulin 1- taking no insulin on some days -2- taking 5 units across the board and also 3- decreasing all of his insulin doses to 10 units instead of only the nighttime cloudy insulin.  He states he gets confused easily and liked taking consistent amounts of both insulins. He agreed to 15 units of both N and R (the previous dose he was on from Dr. Dwyane Dee) to address this weeks hyperglycemia and 10 units of both N and R in the evening before dinner to prevent the hypoglycemia he was having.  These dose changes were discussed today with the attending physician Dr. Philipp Ovens.  He denies having any hypoglycemia this past week.   He is a good personal CGM candidate to help him prevent hyperglycemia and hypoglycemia.   Continuous glucose monitoring download: Week 1  Week 2    Glulose management indicator   7.4%   Average is    132  for 7 days  169 for 15 days   Time sensor is active   100 %  100%   Time in range (70-180 mg/dL):  60 % (Goal >70%)  46%   Time High (181-250 mg/dL)  20 % (Goal < 25%)  37%   Time Very  High (>250 mg/dl)  1 % (Goal < 5%)  8%   Time Low (54-69 mg/dL)  10 % (Goal is <4%)  5% (no occurrences in 2nd week)   Time Very Low (<54)  9%  (Goal <1%)  4%  (no occurrences in 2nd week)   Coefficient of variation  41.2% (Goal is <36%)   36.9 %  Patterns noted:   Significant hypoglycemia nocturnally with continually rising with each meal to grater than 180 mg/dl blood sugars during the  With a steep drop between 8 PM and midnight.    No hypoglycemia, significantly elevated blood sugars       Diabetes Self-Management Education - 07/22/19 1500      Visit Information   Visit Type Follow-up      Health Coping   How would you rate your overall health? Good      Patient Education   Medications Reviewed patients medication for diabetes, action, purpose, timing of dose and side effects.    Monitoring Other (comment)   reveiwed CGM and meter data with patient   Acute complications Discussed and identified patients' treatment of hyperglycemia.      Patient Self-Evaluation of Goals - Patient rates self as meeting previously set goals (% of time)   Medications 50 - 75 %  pt asking for help     Outcomes   Expected Outcomes Demonstrated interest in learning. Expect positive outcomes    Future DMSE Other (comment)   3 weeks   Program Status Not Completed      Subsequent Visit   Since your last visit have you continued or begun to take your medications as prescribed? No   see notes- having trouble keeping up with medications   Since your last visit have you had your blood pressure checked? Yes   someone told him his pressure is high & is missing meds   Is your most recent blood pressure lower, unchanged, or higher since your last visit? Higher    Since your last visit have you experienced any weight changes? No change    Since your last visit, are you checking your blood glucose at least once a day? Yes   meter downloaded          Individualized Plan for Diabetes Self-Management Training:    Learning Objective:  Patient will have a greater understanding of diabetes self-management. Patient education plan is to attend individual and/or group sessions per assessed needs and concerns.   Plan:   Patient Instructions  Please take the following:      Cloudy insulin    Clear insulin  Before breakfast 15 units        +     15 units  Before supper           10 units   +         10 units   I will ask for pharmacy help.   I will contact a mail order company and they will call you to tell you how much the sensors for a Continuous glucose monitoring will cost.   Please stop at the front desk to make first available appointment with Dr. Daryll Drown and myself.   [name] [phone]  .  Expected Outcomes:  Demonstrated interest in learning. Expect positive outcomes Education material provided: Diabetes Resources If problems or questions, patient to contact team via:  Phone Future DSME appointment: Other (comment) (3 weeks)

## 2019-07-22 NOTE — Progress Notes (Signed)
Patient requesting assistance with medicine. He is missing doses.

## 2019-07-26 ENCOUNTER — Ambulatory Visit: Payer: BC Managed Care – PPO | Admitting: *Deleted

## 2019-07-26 DIAGNOSIS — E1142 Type 2 diabetes mellitus with diabetic polyneuropathy: Secondary | ICD-10-CM

## 2019-07-26 DIAGNOSIS — I10 Essential (primary) hypertension: Secondary | ICD-10-CM

## 2019-07-26 DIAGNOSIS — I2510 Atherosclerotic heart disease of native coronary artery without angina pectoris: Secondary | ICD-10-CM

## 2019-07-26 NOTE — Patient Instructions (Signed)
Visit Information It was nice speaking with you today. Please bring all of your medication bottles and your pill box with you to your clinic appointment on 08/18/19 and we will review your medications after you meet with [name].  Goals Addressed              This Visit's Progress     Patient Stated   .  "I use a pill box and I take my medications like I'm supposed to." (pt-stated)        CARE PLAN ENTRY (see longtitudinal plan of care for additional care plan information)  Current Barriers:  . Chronic Disease Management support, education, and care coordination needs related to CHF, CAD, HTN, HLD, and DMII  Clinical Goal(s) related to CHF, CAD, HTN, HLD, and DMII:  Over the next 30 days, patient will:  . Work with the care management team to address educational, disease management, and care coordination needs  . Begin or continue self health monitoring activities as directed today  take medications as directed . Call provider office for new or worsened signs and symptoms Blood glucose findings outside established parameters and New or worsened symptom related to CHF, CAD, HTN, HLD, and DMII . Call care management team with questions or concerns . Verbalize basic understanding of patient centered plan of care established today  Interventions related to CHF, CAD, HTN, HLD, and DMII:  . Evaluation of current treatment plans and patient's adherence to plan as established by provider . Assessed patient understanding of disease states . Assessed patient's education and care coordination needs . Provided disease specific education to patient  . Reviewed medication adherence in detail, will plan to meet with patient on 7/15 to review medications and fill pill box . Collaborated with appropriate clinical care team members regarding patient needs  Patient Self Care Activities related to CHF, CAD, HTN, HLD, and DMII:  . Patient is unable to independently self-manage chronic health  conditions  Initial goal documentation        [name] [name] was given information about Chronic Care Management services today including:  1. CCM service includes personalized support from designated clinical staff supervised by his physician, including individualized plan of care and coordination with other care providers 2. 24/7 contact phone numbers for assistance for urgent and routine care needs. 3. Service will only be billed when office clinical staff spend 20 minutes or more in a month to coordinate care. 4. Only one practitioner may furnish and bill the service in a calendar month. 5. The patient may stop CCM services at any time (effective at the end of the month) by phone call to the office staff. 6. The patient will be responsible for cost sharing (co-pay) of up to 20% of the service fee (after annual deductible is met).  Patient agreed to services and verbal consent obtained.   The patient verbalized understanding of instructions provided today and declined a print copy of patient instruction materials.   The care management team will reach out to the patient again over the next 30 days.   Kelli Churn RN, CCM, [facility] RN Care Manager [phone]

## 2019-07-26 NOTE — Progress Notes (Signed)
Internal Medicine Clinic Attending  CCM services provided by the care management provider and their documentation were discussed with Dr. Basaraba. We reviewed the pertinent findings, urgent action items addressed by the resident and non-urgent items to be addressed by the PCP.  I agree with the assessment, diagnosis, and plan of care documented in the CCM and resident's note.  Rayann Jolley, MD 07/26/2019 

## 2019-07-26 NOTE — Progress Notes (Signed)
Internal Medicine Clinic Resident  I have personally reviewed this encounter including the documentation in this note and/or discussed this patient with the care management provider. I will address any urgent items identified by the care management provider and will communicate my actions to the patient's PCP. I have reviewed the patient's CCM visit with my supervising attending, Dr Guilloud.  Kirrah Mustin, MD 07/26/2019    

## 2019-07-26 NOTE — Chronic Care Management (AMB) (Signed)
Chronic Care Management   Initial Visit Note  07/26/2019 Name: [name] [name] MRN: [id number] DOB: [birth year]  Referred by: [name], MD Reason for referral : No chief complaint on file.   [name] [name] is a 86 y.o. year old male who is a primary care patient of [name], MD. The CCM team was consulted for assistance with chronic disease management and care coordination needs related to CHF, CAD, HTN, HLD, and DMII.  Review of patient status, including review of consultants reports, relevant laboratory and other test results, and collaboration with appropriate care team members and the patient's provider was performed as part of comprehensive patient evaluation and provision of chronic care management services.    SDOH (Social Determinants of Health) assessments performed: Yes See Care Plan activities for detailed interventions related to SDOH  SDOH Interventions     Most Recent Value  SDOH Interventions  Food Insecurity Interventions Intervention Not Indicated  Financial Strain Interventions Intervention Not Indicated  Housing Interventions Intervention Not Indicated  Intimate Partner Violence Interventions Intervention Not Indicated  Physical Activity Interventions Intervention Not Indicated  Stress Interventions Intervention Not Indicated  Social Connections Interventions Intervention Not Indicated  Transportation Interventions Intervention Not Indicated       Medications: Outpatient Encounter Medications as of 07/26/2019  Medication Sig Note  . allopurinol (ZYLOPRIM) 100 MG tablet Take 2 tablets (200 mg total) by mouth daily. 07/22/2019: Missing some doses  . amLODipine (NORVASC) 10 MG tablet Take 1 tablet by mouth once daily 07/22/2019: Missing some doses  . aspirin EC 81 MG tablet Take 1 tablet (81 mg total) by mouth daily.   [name] atorvastatin (LIPITOR) 40 MG tablet Take 1 tablet by mouth once daily   . cilostazol (PLETAL) 50 MG tablet Take 1 tablet (50 mg  total) by mouth 2 (two) times daily.   . colchicine 0.6 MG tablet Take 1 tablet (0.6 mg total) by mouth 2 (two) times daily as needed.   . furosemide (LASIX) 20 MG tablet Take 1 tablet (20 mg total) by mouth daily.   [name] gabapentin (NEURONTIN) 100 MG capsule Take 1 capsule (100 mg total) by mouth 3 (three) times daily. 07/26/2019: Says he takes twice a day not three times daily - doesn't want to take 3 times daily  . insulin NPH Human (HUMULIN N,NOVOLIN N) 100 UNIT/ML injection Inject 15 Units into the skin 2 (two) times daily.    . Insulin Regular Human (NOVOLIN R RELION IJ) Inject 15 Units as directed 2 (two) times daily before a meal.   . Insulin Syringe-Needle U-100 31G X 15/64" 0.3 ML MISC Use to inject insulin 4 times a day   . losartan (COZAAR) 50 MG tablet Take 1 tablet by mouth once daily 07/22/2019: Missing some doses  . metFORMIN (GLUCOPHAGE-XR) 500 MG 24 hr tablet TAKE 2 TABLETS BY MOUTH ONCE DAILY WITH SUPPER 07/22/2019: Missing some doses  . metoprolol tartrate (LOPRESSOR) 25 MG tablet Take 1 tablet by mouth twice daily 07/22/2019: Missing some doses  . ONETOUCH DELICA LANCETS FINE MISC Use to check blood sugar 2 times per day dx code E11.49   . ONETOUCH VERIO test strip USE AS DIRECTED TO CHECK BLOOD SUGAR TWICE DAILY   . pantoprazole (PROTONIX) 40 MG tablet Take 1 tablet (40 mg total) by mouth daily.   . Blood Glucose Monitoring Suppl (ONETOUCH VERIO FLEX SYSTEM) w/Device KIT Use OneTouch Verio Flex meter to check blood sugar twice daily.   [name] loratadine (ALLERGY RELIEF) 10 MG  tablet Take 1 tablet (10 mg total) by mouth daily as needed for rhinitis.   [name] oxybutynin (DITROPAN) 5 MG tablet Take 1 tablet (5 mg total) by mouth 3 (three) times daily.   . tamsulosin (FLOMAX) 0.4 MG CAPS capsule Take 1 capsule (0.4 mg total) by mouth daily. (Patient not taking: Reported on 07/22/2019) 07/22/2019: Missing some doses   No facility-administered encounter medications on file as of 07/26/2019.      Objective:  Lab Results  Component Value Date   HGBA1C 7.5 (A) 05/11/2019   HGBA1C 7.2 (A) 02/10/2019   HGBA1C 6.8 (A) 11/03/2018   Lab Results  Component Value Date   MICROALBUR 11.7 (H) 11/04/2018   LDLCALC 34 11/04/2018   CREATININE 1.43 05/11/2019   Wt Readings from Last 3 Encounters:  07/22/19 279 lb 3.2 oz (126.6 kg)  07/08/19 278 lb 3.2 oz (126.2 kg)  06/24/19 276 lb 14.4 oz (125.6 kg)   BP Readings from Last 3 Encounters:  06/24/19 (!) 147/73  05/27/19 134/68  05/11/19 (!) 130/58      Goals Addressed              This Visit's Progress     Patient Stated   .  "I use a pill box and I take my medications like I'm supposed to." (pt-stated)        CARE PLAN ENTRY (see longitudinal plan of care for additional care plan information)  Current Barriers:  . Chronic Disease Management support, education, and care coordination needs related to CHF, CAD, HTN, HLD, and DMII  Clinical Goal(s) related to CHF, CAD, HTN, HLD, and DMII:  Over the next 30 days, patient will:  . Work with the care management team to address educational, disease management, and care coordination needs  . Begin or continue self health monitoring activities as directed today  take medications as directed . Call provider office for new or worsened signs and symptoms Blood glucose findings outside established parameters and New or worsened symptom related to CHF, CAD, HTN, HLD, and DMII . Call care management team with questions or concerns . Verbalize basic understanding of patient centered plan of care established today  Interventions related to CHF, CAD, HTN, HLD, and DMII:  . Evaluation of current treatment plans and patient's adherence to plan as established by provider . Assessed patient understanding of disease states . Assessed patient's education and care coordination needs . Provided disease specific education to patient  . Reviewed medication adherence in detail, will plan to meet  with patient on 7/15 to review medications and fill pill box . Collaborated with appropriate clinical care team members regarding patient needs  Patient Self Care Activities related to CHF, CAD, HTN, HLD, and DMII:  . Patient is unable to independently self-manage chronic health conditions  Initial goal documentation         Mr. Fouts was given information about Chronic Care Management services today including:  1. CCM service includes personalized support from designated clinical staff supervised by his physician, including individualized plan of care and coordination with other care providers 2. 24/7 contact phone numbers for assistance for urgent and routine care needs. 3. Service will only be billed when office clinical staff spend 20 minutes or more in a month to coordinate care. 4. Only one practitioner may furnish and bill the service in a calendar month. 5. The patient may stop CCM services at any time (effective at the end of the month) by phone call to the office  staff. 6. The patient will be responsible for cost sharing (co-pay) of up to 20% of the service fee (after annual deductible is met).  Patient wishes to consider information provided and/or speak with a member of the care team before deciding about enrollment in care management services.   Plan: The care management team will reach out to the patient again over the next 30 days.   Kelli Churn RN, CCM, [facility] RN Care Manager [phone]

## 2019-08-01 ENCOUNTER — Telehealth: Payer: Self-pay | Admitting: Internal Medicine

## 2019-08-01 ENCOUNTER — Other Ambulatory Visit: Payer: Self-pay | Admitting: Endocrinology

## 2019-08-01 ENCOUNTER — Other Ambulatory Visit: Payer: Self-pay | Admitting: Internal Medicine

## 2019-08-01 DIAGNOSIS — M109 Gout, unspecified: Secondary | ICD-10-CM

## 2019-08-01 NOTE — Telephone Encounter (Signed)
  LAST APPOINTMENT DATE: 05/11/2019   NEXT APPOINTMENT DATE:@79 /2021  MEDICATION:metFORMIN (GLUCOPHAGE-XR) 500 MG 24 hr tablet  [place], [place] [address]  COMMENTS: Patient states she is completely out once she takes her afternoon pills

## 2019-08-01 NOTE — Telephone Encounter (Signed)
Rx sent 

## 2019-08-09 ENCOUNTER — Other Ambulatory Visit: Payer: Self-pay

## 2019-08-09 ENCOUNTER — Other Ambulatory Visit (INDEPENDENT_AMBULATORY_CARE_PROVIDER_SITE_OTHER): Payer: Medicare (Managed Care)

## 2019-08-09 DIAGNOSIS — E1165 Type 2 diabetes mellitus with hyperglycemia: Secondary | ICD-10-CM | POA: Diagnosis not present

## 2019-08-09 DIAGNOSIS — E7800 Pure hypercholesterolemia, unspecified: Secondary | ICD-10-CM | POA: Diagnosis not present

## 2019-08-09 DIAGNOSIS — Z794 Long term (current) use of insulin: Secondary | ICD-10-CM | POA: Diagnosis not present

## 2019-08-09 LAB — MICROALBUMIN / CREATININE URINE RATIO
Creatinine,U: 119.9 mg/dL
Microalb Creat Ratio: 2.9 mg/g (ref 0.0–30.0)
Microalb, Ur: 3.5 mg/dL — ABNORMAL HIGH (ref 0.0–1.9)

## 2019-08-09 LAB — LIPID PANEL
Cholesterol: 103 mg/dL (ref 0–200)
HDL: 34.5 mg/dL — ABNORMAL LOW (ref >39.00)
LDL Cholesterol: 46 mg/dL (ref 0–99)
NonHDL: 68.6
Total CHOL/HDL Ratio: 3
Triglycerides: 111 mg/dL (ref 0.0–149.0)
VLDL: 22.2 mg/dL (ref 0.0–40.0)

## 2019-08-09 LAB — HEMOGLOBIN A1C: Hgb A1c MFr Bld: 8.2 % — ABNORMAL HIGH (ref 4.6–6.5)

## 2019-08-09 LAB — COMPREHENSIVE METABOLIC PANEL
ALT: 32 U/L (ref 0–53)
AST: 29 U/L (ref 0–37)
Albumin: 3.8 g/dL (ref 3.5–5.2)
Alkaline Phosphatase: 91 U/L (ref 39–117)
BUN: 23 mg/dL (ref 6–23)
CO2: 29 mEq/L (ref 19–32)
Calcium: 9.2 mg/dL (ref 8.4–10.5)
Chloride: 106 mEq/L (ref 96–112)
Creatinine, Ser: 1.35 mg/dL (ref 0.40–1.50)
GFR: 60.66 mL/min (ref >60.00)
Glucose, Bld: 186 mg/dL — ABNORMAL HIGH (ref 70–99)
Potassium: 4.1 mEq/L (ref 3.5–5.1)
Sodium: 141 mEq/L (ref 135–145)
Total Bilirubin: 0.6 mg/dL (ref 0.2–1.2)
Total Protein: 6.8 g/dL (ref 6.0–8.3)

## 2019-08-12 ENCOUNTER — Ambulatory Visit (INDEPENDENT_AMBULATORY_CARE_PROVIDER_SITE_OTHER): Payer: Medicare (Managed Care) | Admitting: Endocrinology

## 2019-08-12 ENCOUNTER — Other Ambulatory Visit: Payer: Self-pay

## 2019-08-12 ENCOUNTER — Encounter: Payer: Self-pay | Admitting: Endocrinology

## 2019-08-12 VITALS — BP 132/76 | HR 64 | Ht 73.0 in | Wt 280.6 lb

## 2019-08-12 DIAGNOSIS — Z794 Long term (current) use of insulin: Secondary | ICD-10-CM

## 2019-08-12 DIAGNOSIS — E1142 Type 2 diabetes mellitus with diabetic polyneuropathy: Secondary | ICD-10-CM

## 2019-08-12 DIAGNOSIS — I10 Essential (primary) hypertension: Secondary | ICD-10-CM

## 2019-08-12 DIAGNOSIS — E1165 Type 2 diabetes mellitus with hyperglycemia: Secondary | ICD-10-CM | POA: Diagnosis not present

## 2019-08-12 NOTE — Progress Notes (Signed)
Patient ID: [name] [name], [name] [name]   DOB: 05/13/1931, 86 y.o.   MRN: [id number]   Reason for Appointment: diabetes follow-up  History of Present Illness     DIABETES type II:  Diagnosis date: 1990  Previous history: He has been on insulin since 2005 Previously on Lantus and also subsequently basal bolus regimen but this was changed to NPH and regular insulin because of cost his A1c has been usually near 7% in the past He was switched from Lantus to NPH because of cost  Recent history:   INSULIN REGIMEN:    NOVOLIN-N -Relion 15 units before breakfast and 15 units supper Relion/ Regular Insulin 10 units twice daily before breakfast and dinner     Oral hypoglycemic drugs: Metformin ER 500 mg -- 2 in p.m.      Current blood sugar patterns and problems identified:   His A1c is relatively higher at 8.2, was 7.5  He was told to start taking regular insulin at lunchtime but he is not doing so and apparently did not understand his instructions  Again he is usually eating a sandwich at lunchtime like a chicken salad or a fish sandwich  Blood sugars are usually significantly high at dinnertime with only but 3 readings within target  Occasionally may eat a more mixed meals such as using pinto beans at lunchtime  Also despite instructing him to sometimes check his blood sugars before lunch or at that time he only checks his blood sugar when he is taking his insulin  He thinks he is trying to take his insulin before starting to eat and is usually regular with this  No hypoglycemia reported with lowest blood sugar 95  He is regular with Metformin  No exercise because of leg pains .          Monitors blood glucose: <1 times a day.    Glucometer:   One Touch Verio        Blood Glucose readings and median from download:   PRE-MEAL Fasting Lunch Dinner Bedtime Overall  Glucose range:  95-167 ?   139-376   95-376  Mean/median:  140   226   149   Previous  readings  PRE-MEAL Fasting Lunch Dinner Bedtime Overall  Glucose range:  103-255   115-284    Mean/median:  156   208   188      Meals: 2-3 meals per day.  breakfast is eggs,oatmeal usually.   Breakfast 7 AM, lunch 1 PM and dinner 6-7                 Dietician visit: Most recent:  02/2017     Weight control:   Wt Readings from Last 3 Encounters:  08/12/19 280 lb 9.6 oz (127.3 kg)  07/22/19 279 lb 3.2 oz (126.6 kg)  07/08/19 278 lb 3.2 oz (126.2 kg)           Diabetes labs:  Lab Results  Component Value Date   HGBA1C 8.2 (H) 08/09/2019   HGBA1C 7.5 (A) 05/11/2019   HGBA1C 7.2 (A) 02/10/2019   Lab Results  Component Value Date   MICROALBUR 3.5 (H) 08/09/2019   LDLCALC 46 08/09/2019   CREATININE 1.35 08/09/2019     Allergies as of 08/12/2019   No Known Allergies     Medication List       Accurate as of August 12, 2019 11:54 AM. If you have any questions, ask your nurse or doctor.  allopurinol 100 MG tablet Commonly known as: ZYLOPRIM Take 2 tablets by mouth once daily   amLODipine 10 MG tablet Commonly known as: NORVASC Take 1 tablet by mouth once daily   aspirin EC 81 MG tablet Take 1 tablet (81 mg total) by mouth daily.   atorvastatin 40 MG tablet Commonly known as: LIPITOR Take 1 tablet by mouth once daily   cilostazol 50 MG tablet Commonly known as: PLETAL Take 1 tablet (50 mg total) by mouth 2 (two) times daily.   colchicine 0.6 MG tablet Take 1 tablet (0.6 mg total) by mouth 2 (two) times daily as needed.   furosemide 20 MG tablet Commonly known as: LASIX Take 1 tablet (20 mg total) by mouth daily.   gabapentin 100 MG capsule Commonly known as: NEURONTIN Take 1 capsule (100 mg total) by mouth 3 (three) times daily.   insulin NPH Human 100 UNIT/ML injection Commonly known as: NOVOLIN N Inject 10 Units into the skin 2 (two) times daily.   Insulin Syringe-Needle U-100 31G X 15/64" 0.3 ML Misc Use to inject insulin 4 times a day    loratadine 10 MG tablet Commonly known as: Allergy Relief Take 1 tablet (10 mg total) by mouth daily as needed for rhinitis.   losartan 50 MG tablet Commonly known as: COZAAR Take 1 tablet by mouth once daily   metFORMIN 500 MG 24 hr tablet Commonly known as: GLUCOPHAGE-XR TAKE 2 TABLETS BY MOUTH ONCE DAILY WITH SUPPER   metoprolol tartrate 25 MG tablet Commonly known as: LOPRESSOR Take 1 tablet by mouth twice daily   NOVOLIN R RELION IJ Inject 15 Units as directed 2 (two) times daily before a meal.   OneTouch Delica Lancets Fine Misc Use to check blood sugar 2 times per day dx code E11.49   OneTouch Verio Flex System w/Device Kit Use OneTouch Verio Flex meter to check blood sugar twice daily.   OneTouch Verio test strip Generic drug: glucose blood USE AS DIRECTED TO CHECK BLOOD SUGAR TWICE DAILY   oxybutynin 5 MG tablet Commonly known as: DITROPAN Take 1 tablet (5 mg total) by mouth 3 (three) times daily.   pantoprazole 40 MG tablet Commonly known as: Protonix Take 1 tablet (40 mg total) by mouth daily.   tamsulosin 0.4 MG Caps capsule Commonly known as: FLOMAX Take 1 capsule (0.4 mg total) by mouth daily.       Allergies: No Known Allergies  Past Medical History:  Diagnosis Date  . Benign prostatic hyperplasia (BPH) with urinary urge incontinence 11/13/2017  . Chronic left shoulder pain 11/13/2017  . Chronic non-seasonal allergic rhinitis 02/17/2006  . Chronic systolic heart failure ([place]) 10/31/2016   Echo (05/03/2015): LVEF 19-50%, grade 2 diastolic dysfunction  . 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 Essential hypertension 11/04/2005  . Gastroesophageal reflux disease with hiatal hernia  11/04/2005  . History of prostate cancer 02/17/2006   Diagnosed 1995, s/p seed implantation in the late 1990's, PSA undetectable 04/10/2016  . Hyperlipidemia 11/04/2005  . Obesity (BMI 30.0-34.9) 11/13/2017  . Peripheral arterial disease ([place]) 09/30/2007   with claudication, failed attempt at LLE PTCA  . Type 2 diabetes mellitus with microalbuminuria, with long-term current use of insulin ([place]) 11/13/2017  . Type 2 diabetes mellitus with mild  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, bilateral ([place]) 03/24/2018  . Type 2 diabetes mellitus with peripheral neuropathy ([place]) 11/04/2005  . Urethral stricture in [name] [name] 11/13/2017   Noted on cystoscopy 12/18/2014.  Reportedly asked to do chronic intermittent self catheterizations, but has not.    Past Surgical History:  Procedure Laterality Date  . CARDIAC CATHETERIZATION  10/05/08   REVEALS HYPOKINESIS OF THE LATERAL WALL AND EF 50-55%  . CORONARY ANGIOPLASTY WITH STENT PLACEMENT    . CORONARY ARTERY BYPASS GRAFT N/A 01/05/2013   Procedure: CORONARY ARTERY BYPASS GRAFTING (CABG);  Surgeon: Melrose Nakayama, MD;  Location: [place];  Service: Open Heart Surgery;  Laterality: N/A;  . INTRAOPERATIVE TRANSESOPHAGEAL ECHOCARDIOGRAM N/A 01/05/2013   Procedure: INTRAOPERATIVE TRANSESOPHAGEAL ECHOCARDIOGRAM;  Surgeon: Melrose Nakayama, MD;  Location: [place];  Service: Open Heart Surgery;  Laterality: N/A;  . LEFT HEART CATHETERIZATION WITH CORONARY ANGIOGRAM N/A 01/03/2013   Procedure: LEFT HEART CATHETERIZATION WITH CORONARY ANGIOGRAM;  Surgeon: Lorretta Harp, MD;  Location: [facility] CATH LAB;  Service: Cardiovascular;  Laterality: N/A;  . LOWER EXTREMITY ANGIOGRAM Right 12/07/2012   unsuccessful attempt at percutaneous revascularization of a calcified long segment chronic total occlusion mid left SFA /notes 12/07/2012  . LOWER EXTREMITY ANGIOGRAM N/A 12/07/2012   Procedure: LOWER EXTREMITY ANGIOGRAM;  Surgeon: Lorretta Harp, MD;  Location: [facility]  CATH LAB;  Service: Cardiovascular;  Laterality: N/A;  . PROSTATE SURGERY  1990's    Family History  Problem Relation Age of Onset  . Kidney failure Mother   . Hypertension Father   . Stroke Father   . Other Sister   . Other Brother   . Other Brother   . Other Brother   . Other Brother   . Other Brother   . Other Brother   . Other Brother   . Other Brother   . Healthy Daughter   . Healthy Daughter   . Healthy Daughter   . Healthy Son     Social History:  reports that he quit smoking about 39 years ago. His smoking use included cigarettes. He has a 15.00 pack-year smoking history. He has never used smokeless tobacco. He reports that he does not drink alcohol and does not use drugs.  Review of Systems:   Hypertension:  has had long-standing hypertension followed by his PCP and other physicians Blood pressure recently well controlled  BP Readings from Last 3 Encounters:  08/12/19 132/76  06/24/19 (!) 147/73  05/27/19 134/68     Lipids: Has been treated with Lipitor 40 mg Usually has lipid levels well below target   Lab Results  Component Value Date   CHOL 103 08/09/2019   HDL 34.50 (L) 08/09/2019   LDLCALC 46 08/09/2019   TRIG 111.0 08/09/2019   CHOLHDL 3 08/09/2019    Neuropathy: Has been treated with gabapentin 100 mg by his PCP  Claudication: Has pain in the lower legs when he is walking, more on the left calf, followed by cardiologist     Foot exam done in 02/2019, has absent pedal pulses otherwise normal monofilament sensation   RENAL dysfunction: This is mild and stable  He does not have microalbuminuria  Lab Results  Component Value Date   CREATININE 1.35 08/09/2019   CREATININE 1.43 05/11/2019   CREATININE 1.34 02/10/2019       LABS:  Lab on 08/09/2019  Component Date Value Ref Range Status  . Cholesterol 08/09/2019 103  0 - 200 mg/dL Final   ATP III Classification  Desirable:  < 200 mg/dL               Borderline High:  200 - 239  mg/dL          High:  > = 240 mg/dL  . Triglycerides 08/09/2019 111.0  0 - 149 mg/dL Final   Normal:  <150 mg/dLBorderline High:  150 - 199 mg/dL  . HDL 08/09/2019 34.50* >39.00 mg/dL Final  . VLDL 08/09/2019 22.2  0.0 - 40.0 mg/dL Final  . LDL Cholesterol 08/09/2019 46  0 - 99 mg/dL Final  . Total CHOL/HDL Ratio 08/09/2019 3   Final                  Men          Women1/2 Average Risk     3.4          3.3Average Risk          5.0          4.42X Average Risk          9.6          7.13X Average Risk          15.0          11.0                      . NonHDL 08/09/2019 68.60   Final   NOTE:  Non-HDL goal should be 30 mg/dL higher than patient's LDL goal (i.e. LDL goal of < 70 mg/dL, would have non-HDL goal of < 100 mg/dL)  . Microalb, Ur 08/09/2019 3.5* 0.0 - 1.9 mg/dL Final  . Creatinine,U 08/09/2019 119.9  mg/dL Final  . Microalb Creat Ratio 08/09/2019 2.9  0.0 - 30.0 mg/g Final  . Sodium 08/09/2019 141  135 - 145 mEq/L Final  . Potassium 08/09/2019 4.1  3.5 - 5.1 mEq/L Final  . Chloride 08/09/2019 106  96 - 112 mEq/L Final  . CO2 08/09/2019 29  19 - 32 mEq/L Final  . Glucose, Bld 08/09/2019 186* 70 - 99 mg/dL Final  . BUN 08/09/2019 23  6 - 23 mg/dL Final  . Creatinine, Ser 08/09/2019 1.35  0.40 - 1.50 mg/dL Final  . Total Bilirubin 08/09/2019 0.6  0.2 - 1.2 mg/dL Final  . Alkaline Phosphatase 08/09/2019 91  39 - 117 U/L Final  . AST 08/09/2019 29  0 - 37 U/L Final  . ALT 08/09/2019 32  0 - 53 U/L Final  . Total Protein 08/09/2019 6.8  6.0 - 8.3 g/dL Final  . Albumin 08/09/2019 3.8  3.5 - 5.2 g/dL Final  . GFR 08/09/2019 60.66  >60.00 mL/min Final  . Calcium 08/09/2019 9.2  8.4 - 10.5 mg/dL Final  . Hgb A1c MFr Bld 08/09/2019 8.2* 4.6 - 6.5 % Final   Glycemic Control Guidelines for People with Diabetes:Non Diabetic:  <6%Goal of Therapy: <7%Additional Action Suggested:  >8%      Examination:   BP 132/76 (BP Location: Left Arm, Patient Position: Sitting, Cuff Size: Large)   Pulse 64    Ht '6\' 1"'$  (1.854 m)   Wt 280 lb 9.6 oz (127.3 kg)   SpO2 95%   BMI 37.02 kg/m   Body mass index is 37.02 kg/m.   No ankle edema present  ASSESSMENT/ PLAN:     Diabetes type 2 insulin-dependent:   His A1c is higher at 8.2   See history of present illness for detailed discussion of current  diabetes management, blood sugar patterns and problems identified  He is currently on NPH and regular insulin twice a day with Metformin  As discussed above he has difficulty remembering his instructions for monitoring as well as insulin doses Again he is somewhat arbitrary and deciding on his insulin doses and does not always look in his instructions  Blood sugars are averaging over 200 at dinnertime because of frequently eating a higher fat sandwich at lunchtime without any mealtime coverage Otherwise fasting readings are usually good and averaging about 140  He will take 10 units of regular insulin at lunchtime unless he is eating a mixed meal with less carbohydrate Also for safety reasons we will reduce his NPH at dinnertime down to 10 units to avoid potential overnight hypoglycemia Reminded him to check sugars after meals also Discussed blood sugar targets Printed instructions were reviewed with him and he will try to follow this No change in Metformin Encouraged him to start exercise and he thinks he can start going to the [facility] with his buddies and at least try to do some recumbent bike for aerobic exercise or some upper body toning and stretching  HYPERTENSION: Blood pressure is variably controlled, to be followed by PCP and cardiologist  Renal function is stable and no microalbuminuria present  LIPIDS: Except for HDL being low his lipid panel is well controlled    Patient Instructions  Check blood sugars on waking up 3-4 days a week  Also check blood sugars about 2 hours after meals and do this after different meals by rotation  Recommended blood sugar levels on waking up are  90-130 and about 2 hours after meal is 130-160  Please bring your blood sugar monitor to each visit, thank you  15 -30 MIN BEFORE BREAKFAST TAKE 10 CLEAR AND 15 CLOUDY  BEFORE SANDWICH AT LUNCH TAKE 10 CLEAR  15 -30 MIN BEFORE SUPPER TAKE 10 CLEAR AND 10 CLOUDY            [name] [name] 08/12/2019, 11:54 AM

## 2019-08-12 NOTE — Patient Instructions (Signed)
Check blood sugars on waking up 3-4 days a week  Also check blood sugars about 2 hours after meals and do this after different meals by rotation  Recommended blood sugar levels on waking up are 90-130 and about 2 hours after meal is 130-160  Please bring your blood sugar monitor to each visit, thank you  15 -30 MIN BEFORE BREAKFAST TAKE 10 CLEAR AND 15 CLOUDY  BEFORE SANDWICH AT LUNCH TAKE 10 CLEAR  15 -30 MIN BEFORE SUPPER TAKE 10 CLEAR AND 10 CLOUDY

## 2019-08-18 ENCOUNTER — Ambulatory Visit: Payer: BC Managed Care – PPO

## 2019-08-18 ENCOUNTER — Ambulatory Visit: Payer: BC Managed Care – PPO | Admitting: Dietician

## 2019-08-18 ENCOUNTER — Telehealth: Payer: Self-pay | Admitting: Dietician

## 2019-08-18 NOTE — Telephone Encounter (Signed)
[name] [name] agrees to have application for personal CGM sent in to to what his coverage is.

## 2019-08-25 ENCOUNTER — Ambulatory Visit: Payer: Medicare (Managed Care) | Admitting: *Deleted

## 2019-08-25 ENCOUNTER — Telehealth: Payer: Self-pay | Admitting: Dietician

## 2019-08-25 ENCOUNTER — Encounter: Payer: Self-pay | Admitting: Dietician

## 2019-08-25 ENCOUNTER — Ambulatory Visit (INDEPENDENT_AMBULATORY_CARE_PROVIDER_SITE_OTHER): Payer: Medicare (Managed Care) | Admitting: Dietician

## 2019-08-25 DIAGNOSIS — E1129 Type 2 diabetes mellitus with other diabetic kidney complication: Secondary | ICD-10-CM

## 2019-08-25 DIAGNOSIS — R809 Proteinuria, unspecified: Secondary | ICD-10-CM

## 2019-08-25 DIAGNOSIS — Z794 Long term (current) use of insulin: Secondary | ICD-10-CM | POA: Diagnosis not present

## 2019-08-25 DIAGNOSIS — I2510 Atherosclerotic heart disease of native coronary artery without angina pectoris: Secondary | ICD-10-CM

## 2019-08-25 DIAGNOSIS — E1142 Type 2 diabetes mellitus with diabetic polyneuropathy: Secondary | ICD-10-CM

## 2019-08-25 DIAGNOSIS — E7800 Pure hypercholesterolemia, unspecified: Secondary | ICD-10-CM

## 2019-08-25 DIAGNOSIS — I10 Essential (primary) hypertension: Secondary | ICD-10-CM

## 2019-08-25 MED ORDER — "INSULIN SYRINGE-NEEDLE U-100 31G X 15/64"" 0.3 ML MISC": 3 refills | Status: DC

## 2019-08-25 NOTE — Telephone Encounter (Signed)
Refill for 90 day supply of syringes requested.

## 2019-08-25 NOTE — Progress Notes (Signed)
Diabetes Self-Management Education  Visit Type: Follow-up  Appt. Start Time: 1015 Appt. End Time: 1050  08/25/2019  [name], identified by name and date of birth, is a 86 y.o. male with a diagnosis of Diabetes:  [name] Type 2  ASSESSMENT  He said he ordered the personal CGM, but was not ready to learn how to use it today.  His blood sugars are much improved from an average of 204 to 168 for the past 2 weeks. He says he is taking his medicine and eating better, but did not elaborate.   Weight (!) 280 lb (127 kg). Body mass index is 36.94 kg/m.   Diabetes Self-Management Education - 08/25/19 1100      Visit Information   Visit Type Follow-up      Health Coping   How would you rate your overall health? Good      Complications   Have you had a dilated eye exam in the past 12 months? Yes      Patient Education   Medications Taught/reviewed insulin injection, site rotation, insulin storage and needle disposal.   was not pinching up, using thighs and abdomen for injections   Monitoring Other (comment)   He ordered a personal CGM, was not ready to learn about it    Acute complications Discussed and identified patients' treatment of hyperglycemia.    Personal strategies to promote health Helped patient develop diabetes management plan for (enter comment)   site rotation,use of personal CGM     Individualized Goals (developed by patient)   Medications take my medication as prescribed      Patient Self-Evaluation of Goals - Patient rates self as meeting previously set goals (% of time)   Medications >75%      Outcomes   Expected Outcomes Demonstrated interest in learning. Expect positive outcomes    Future DMSE 2 wks    Program Status Not Completed      Subsequent Visit   Since your last visit have you continued or begun to take your medications as prescribed? Yes   states he is doing better at taking his medicine   Since your last visit have you had your blood pressure  checked? No    Since your last visit have you experienced any weight changes? No change    Since your last visit, are you checking your blood glucose at least once a day? Yes   meter downloaded          Individualized Plan for Diabetes Self-Management Training:   Learning Objective:  Patient will have a greater understanding of diabetes self-management. Patient education plan is to attend individual and/or group sessions per assessed needs and concerns.   Plan:   Patient Instructions  Bring your box of CGM with you to your appointment on August 6 if you want me to help you put it on or at least go through it and help you know what it is.    Blood sugars are mush improved.   Please always Pinch up when you inject insulin.[name] Please use your abdomen and rotate sites each injection.   [name] [phone]   Expected Outcomes:  Demonstrated interest in learning. Expect positive outcomes  Education material provided: Diabetes Resources  If problems or questions, patient to contact team via:  Phone  Future DSME appointment: 2 wks  [name], RD 08/25/2019 11:15 AM.

## 2019-08-25 NOTE — Progress Notes (Signed)
Internal Medicine Clinic Resident  I have personally reviewed this encounter including the documentation in this note and/or discussed this patient with the care management provider. I will address any urgent items identified by the care management provider and will communicate my actions to the patient's PCP. I have reviewed the patient's CCM visit with my supervising attending, Dr Dareen Piano.  Delice Bison, DO 08/25/2019

## 2019-08-25 NOTE — Patient Instructions (Signed)
Bring your box of CGM with you to your appointment on August 6 if you want me to help you put it on or at least go through it and help you know what it is.    Blood sugars are mush improved.   Please always Pinch up when you inject insulin.[name] Please use your abdomen and rotate sites each injection.   [name] [phone]

## 2019-08-25 NOTE — Chronic Care Management (AMB) (Signed)
Chronic Care Management   Follow Up Note   08/25/2019 Name: [name] [name] MRN: [id number] DOB: 23-Oct-1931  Referred by: [name], MD Reason for referral : Chronic Care Management (DM, HTN, HLD)   [name] [name] is a 86 y.o. year old male who is a primary care patient of [name], MD. The CCM team was consulted for assistance with chronic disease management and care coordination needs.    Review of patient status, including review of consultants reports, relevant laboratory and other test results, and collaboration with appropriate care team members and the patient's provider was performed as part of comprehensive patient evaluation and provision of chronic care management services.    SDOH (Social Determinants of Health) assessments performed: No See Care Plan activities for detailed interventions related to [facility])     Outpatient Encounter Medications as of 08/25/2019  Medication Sig Note  . allopurinol (ZYLOPRIM) 100 MG tablet Take 2 tablets by mouth once daily   . aspirin EC 81 MG tablet Take 1 tablet (81 mg total) by mouth daily.   [name] atorvastatin (LIPITOR) 40 MG tablet Take 1 tablet by mouth once daily   . cilostazol (PLETAL) 50 MG tablet Take 1 tablet (50 mg total) by mouth 2 (two) times daily.   . colchicine 0.6 MG tablet Take 1 tablet (0.6 mg total) by mouth 2 (two) times daily as needed.   . furosemide (LASIX) 20 MG tablet Take 1 tablet (20 mg total) by mouth daily.   [name] gabapentin (NEURONTIN) 100 MG capsule Take 1 capsule (100 mg total) by mouth 3 (three) times daily. 07/26/2019: Says he takes twice a day not three times daily - doesn't want to take 3 times daily  . insulin NPH Human (HUMULIN N,NOVOLIN N) 100 UNIT/ML injection Inject 10 Units into the skin 2 (two) times daily.    . Insulin Regular Human (NOVOLIN R RELION IJ) Inject 15 Units as directed 2 (two) times daily before a meal.   . loratadine (ALLERGY RELIEF) 10 MG tablet Take 1 tablet (10 mg total)  by mouth daily as needed for rhinitis.   [name] losartan (COZAAR) 50 MG tablet Take 1 tablet by mouth once daily 07/22/2019: Missing some doses  . metFORMIN (GLUCOPHAGE-XR) 500 MG 24 hr tablet TAKE 2 TABLETS BY MOUTH ONCE DAILY WITH SUPPER   . metoprolol tartrate (LOPRESSOR) 25 MG tablet Take 1 tablet by mouth twice daily 07/22/2019: Missing some doses  . ONETOUCH DELICA LANCETS FINE MISC Use to check blood sugar 2 times per day dx code E11.49   . oxybutynin (DITROPAN) 5 MG tablet Take 1 tablet (5 mg total) by mouth 3 (three) times daily.   . pantoprazole (PROTONIX) 40 MG tablet Take 1 tablet (40 mg total) by mouth daily.   . [DISCONTINUED] Insulin Syringe-Needle U-100 31G X 15/64" 0.3 ML MISC Use to inject insulin 4 times a day   . amLODipine (NORVASC) 10 MG tablet Take 1 tablet by mouth once daily 07/22/2019: Missing some doses  . Blood Glucose Monitoring Suppl (ONETOUCH VERIO FLEX SYSTEM) w/Device KIT Use OneTouch Verio Flex meter to check blood sugar twice daily.   Glory Rosebush VERIO test strip USE AS DIRECTED TO CHECK BLOOD SUGAR TWICE DAILY   . tamsulosin (FLOMAX) 0.4 MG CAPS capsule Take 1 capsule (0.4 mg total) by mouth daily. 07/22/2019: Missing some doses   No facility-administered encounter medications on file as of 08/25/2019.     Objective:  Lab Results  Component Value Date  CHOL 103 08/09/2019   HDL 34.50 (L) 08/09/2019   LDLCALC 46 08/09/2019   TRIG 111.0 08/09/2019   CHOLHDL 3 08/09/2019    Goals Addressed              This Visit's Progress     Patient Stated   .  "I use a pill box and I take my medications like I'm supposed to." (pt-stated)        CARE PLAN ENTRY (see longitudinal plan of care for additional care plan information)  Current Barriers:  . Chronic Disease Management support, education, and care coordination needs related to CHF, CAD, HTN, HLD, and DMII-met with patient in clinic to review medication taking behavior  Clinical Goal(s) related to CHF, CAD,  HTN, HLD, and DMII:  Over the next 30 -60 days, patient will:  . Work with the care management team to address educational, disease management, and care coordination needs  . Begin or continue self health monitoring activities as directed today  take medications as directed . Call provider office for new or worsened signs and symptoms Blood glucose findings outside established parameters and New or worsened symptom related to CHF, CAD, HTN, HLD, and DMII . Call care management team with questions or concerns . Verbalize basic understanding of patient centered plan of care established today  Interventions related to CHF, CAD, HTN, HLD, and DMII:  . Evaluation of current treatment plans and patient's adherence to plan as established by provider . Assessed patient understanding of disease states . Assessed patient's education and care coordination needs . Provided disease specific education to patient - provided patient with Blood Pressure Control booklet, , an [employer] spiral bound notebook with business cards of CCM RN and CCM BSW . Assessed blood pressure and reviewed how to monitor blood pressure at home, reviewed blood pressure targets and wrote targets in notebook, will plan to give patient blood pressure monitor at his clinic visit on 09/09/19 . Reviewed medication adherence in detail, labeled his medication bottles with indication for use, provided pill box . Collaborated with appropriate clinical care team members regarding patient needs  Patient Self Care Activities related to CHF, CAD, HTN, HLD, and DMII:  . Patient is unable to independently self-manage chronic health conditions  Please see past updates related to this goal by clicking on the "Past Updates" button in the selected goal        Other   .  Blood Pressure < 130/80        BP Readings from Last 3 Encounters:  08/12/19 132/76  06/24/19 (!) 147/73  05/27/19 134/68   BP taken in clinic on  7/22= 149/79- patient states he has taken all a.m. BP medications    .  HEMOGLOBIN A1C < 7        Lab Results  Component Value Date   HGBA1C 8.2 (H) 08/09/2019   Not meeting target    .  Weight (lb) < 200 lb (90.7 kg)        Wt Readings from Last 3 Encounters:  08/25/19 (!) 280 lb (127 kg)  08/12/19 280 lb 9.6 oz (127.3 kg)  07/22/19 279 lb 3.2 oz (126.6 kg)   Not meeting weight target        Plan:  Face to Face appointment with care management team member scheduled for: 8//21   Kelli Churn RN, CCM, [facility] RN Care Manager [phone]

## 2019-08-25 NOTE — Patient Instructions (Signed)
Visit Information It was nice seeing you today. I will plan to meet with you during you clinic appointment on 09/09/19 and provide you with a home blood pressure monitor.  Goals Addressed              This Visit's Progress     Patient Stated   .  "I use a pill box and I take my medications like I'm supposed to." (pt-stated)        CARE PLAN ENTRY (see longitudinal plan of care for additional care plan information)  Current Barriers:  . Chronic Disease Management support, education, and care coordination needs related to CHF, CAD, HTN, HLD, and DMII-met with patient in clinic to review medication taking behavior  Clinical Goal(s) related to CHF, CAD, HTN, HLD, and DMII:  Over the next 30 -60 days, patient will:  . Work with the care management team to address educational, disease management, and care coordination needs  . Begin or continue self health monitoring activities as directed today  take medications as directed . Call provider office for new or worsened signs and symptoms Blood glucose findings outside established parameters and New or worsened symptom related to CHF, CAD, HTN, HLD, and DMII . Call care management team with questions or concerns . Verbalize basic understanding of patient centered plan of care established today  Interventions related to CHF, CAD, HTN, HLD, and DMII:  . Evaluation of current treatment plans and patient's adherence to plan as established by provider . Assessed patient understanding of disease states . Assessed patient's education and care coordination needs . Provided disease specific education to patient - provided patient with Blood Pressure Control booklet, , an [employer] spiral bound notebook with business cards of CCM RN and CCM BSW . Assessed blood pressure and reviewed how to monitor blood pressure at home, reviewed blood pressure targets and wrote targets in notebook, will plan to give patient blood pressure  monitor at his clinic visit on 09/09/19 . Reviewed medication adherence in detail, labeled his medication bottles with indication for use, provided pill box . Collaborated with appropriate clinical care team members regarding patient needs  Patient Self Care Activities related to CHF, CAD, HTN, HLD, and DMII:  . Patient is unable to independently self-manage chronic health conditions  Please see past updates related to this goal by clicking on the "Past Updates" button in the selected goal        Other   .  Blood Pressure < 130/80        BP Readings from Last 3 Encounters:  08/12/19 132/76  06/24/19 (!) 147/73  05/27/19 134/68   BP taken in clinic on 7/22= 149/79- patient states he has taken all a.m. BP medications    .  HEMOGLOBIN A1C < 7        Lab Results  Component Value Date   HGBA1C 8.2 (H) 08/09/2019   Not meeting target    .  Weight (lb) < 200 lb (90.7 kg)        Wt Readings from Last 3 Encounters:  08/25/19 (!) 280 lb (127 kg)  08/12/19 280 lb 9.6 oz (127.3 kg)  07/22/19 279 lb 3.2 oz (126.6 kg)   Not meeting weight target       The patient verbalized understanding of instructions provided today and declined a print copy of patient instruction materials.   Face to Face appointment with care management team member scheduled for: 09/09/19  Kelli Churn RN, CCM,  [facility] RN Care Manager [phone]

## 2019-08-26 NOTE — Progress Notes (Signed)
Internal Medicine Clinic Attending  CCM services provided by the care management provider and their documentation were discussed with Dr. Koleen Distance. We reviewed the pertinent findings, urgent action items addressed by the resident and non-urgent items to be addressed by the PCP.  I agree with the assessment, diagnosis, and plan of care documented in the CCM and resident's note.  Aldine Contes, MD 08/26/2019

## 2019-09-01 ENCOUNTER — Other Ambulatory Visit: Payer: Self-pay | Admitting: Endocrinology

## 2019-09-08 ENCOUNTER — Encounter: Payer: Self-pay | Admitting: Internal Medicine

## 2019-09-09 ENCOUNTER — Encounter: Payer: Self-pay | Admitting: Internal Medicine

## 2019-09-09 ENCOUNTER — Ambulatory Visit (INDEPENDENT_AMBULATORY_CARE_PROVIDER_SITE_OTHER): Payer: Medicare (Managed Care) | Admitting: Internal Medicine

## 2019-09-09 ENCOUNTER — Ambulatory Visit: Payer: Medicare (Managed Care) | Admitting: *Deleted

## 2019-09-09 DIAGNOSIS — M1990 Unspecified osteoarthritis, unspecified site: Secondary | ICD-10-CM

## 2019-09-09 DIAGNOSIS — E1129 Type 2 diabetes mellitus with other diabetic kidney complication: Secondary | ICD-10-CM

## 2019-09-09 DIAGNOSIS — I2510 Atherosclerotic heart disease of native coronary artery without angina pectoris: Secondary | ICD-10-CM

## 2019-09-09 DIAGNOSIS — I5022 Chronic systolic (congestive) heart failure: Secondary | ICD-10-CM | POA: Diagnosis not present

## 2019-09-09 DIAGNOSIS — E7800 Pure hypercholesterolemia, unspecified: Secondary | ICD-10-CM

## 2019-09-09 DIAGNOSIS — E785 Hyperlipidemia, unspecified: Secondary | ICD-10-CM

## 2019-09-09 DIAGNOSIS — J3089 Other allergic rhinitis: Secondary | ICD-10-CM

## 2019-09-09 DIAGNOSIS — M1A9XX0 Chronic gout, unspecified, without tophus (tophi): Secondary | ICD-10-CM

## 2019-09-09 DIAGNOSIS — I739 Peripheral vascular disease, unspecified: Secondary | ICD-10-CM

## 2019-09-09 DIAGNOSIS — I10 Essential (primary) hypertension: Secondary | ICD-10-CM

## 2019-09-09 DIAGNOSIS — E1142 Type 2 diabetes mellitus with diabetic polyneuropathy: Secondary | ICD-10-CM

## 2019-09-09 DIAGNOSIS — I110 Hypertensive heart disease with heart failure: Secondary | ICD-10-CM

## 2019-09-09 DIAGNOSIS — N35919 Unspecified urethral stricture, male, unspecified site: Secondary | ICD-10-CM

## 2019-09-09 DIAGNOSIS — Z6835 Body mass index (BMI) 35.0-35.9, adult: Secondary | ICD-10-CM

## 2019-09-09 DIAGNOSIS — Z794 Long term (current) use of insulin: Secondary | ICD-10-CM

## 2019-09-09 MED ORDER — DICLOFENAC SODIUM 1 % EX GEL: 2.0000 g | Freq: Four times a day (QID) | CUTANEOUS | 3 refills | Status: DC | PRN

## 2019-09-09 NOTE — Assessment & Plan Note (Signed)
He reports not seeing urology any more.  He notes no change in urination, no hematuria and no abdominal pain.  Continue to monitor.

## 2019-09-09 NOTE — Assessment & Plan Note (Signed)
He is doing well today.  He follows with his endocrinologist Dr. Dwyane Dee.  I reviewed their last note.  His A1C is 8.2 which is likely a good level for him at his current age and due to other risk factors.  Will continue to follow with endocrinology.  He was set up for an eye exam at [facility] today.

## 2019-09-09 NOTE — Assessment & Plan Note (Signed)
He denies any chest pain at this time.  He is taking aspirin, statin and has no complaints with these.    Plan Continue monitoring.   Last EKG done at Cardiology unchanged

## 2019-09-09 NOTE — Patient Instructions (Signed)
Visit Information It was good seeing you today int he clinic. Be sure and keep your eye appointment on 8/31//21 at 1:10 pm.  Goals Addressed              This Visit's Progress     Patient Stated   .  "I use a pill box and I take my medications like I'm supposed to." (pt-stated)        CARE PLAN ENTRY (see longitudinal plan of care for additional care plan information)  Current Barriers:  . Chronic Disease Management support, education, and care coordination needs related to CHF, CAD, HTN, HLD, and DMII-met with patient in clinic, he requested assistance with making annual diabetic eye exam with [facility]  Clinical Goal(s) related to CHF, CAD, HTN, HLD, and DMII:  Over the next 30 -60 days, patient will:  . Work with the care management team to address educational, disease management, and care coordination needs  . Begin or continue self health monitoring activities as directed today  take medications as directed . Call provider office for new or worsened signs and symptoms Blood glucose findings outside established parameters and New or worsened symptom related to CHF, CAD, HTN, HLD, and DMII . Call care management team with questions or concerns . Verbalize basic understanding of patient centered plan of care established today  Interventions related to CHF, CAD, HTN, HLD, and DMII:  . Evaluation of current treatment plans and patient's adherence to plan as established by provider . Assessed patient understanding of disease states . Assessed patient's education and care coordination needs . Provided disease specific education to patient -  . Advised patient to call the customer service number on his insurance card to see if a home BP monitor is a covered benefit . Reminded patient he will need to write on the label of each refill bottle of medicine its indication . Collaborated with appropriate clinical care team members regarding patient needs- called [facility] and  schedule patient's annual diabetic eye exam for 8/3/121 at 1:10 pm; provided patient with CCM RN's  business card with eye appointment  information on the back of the card  Patient Self Care Activities related to CHF, CAD, HTN, HLD, and DMII:  . Patient is unable to independently self-manage chronic health conditions  Please see past updates related to this goal by clicking on the "Past Updates" button in the selected goal        Other   .  Blood Pressure < 130/80         BP Readings from Last 3 Encounters:  09/09/19 117/72  08/12/19 132/76  06/24/19 (!) 147/73   Meeting blood pressure targets    .  Weight (lb) < 200 lb (90.7 kg)        Wt Readings from Last 3 Encounters:  09/09/19 277 lb 4.8 oz (125.8 kg)  08/25/19 (!) 280 lb (127 kg)  08/12/19 280 lb 9.6 oz (127.3 kg)    Not meeting weight target       Print copy of patient instructions provided.   The care management team will reach out to the patient again over the next 30-60 days.   Kelli Churn RN, CCM, [facility] RN Care Manager [phone]

## 2019-09-09 NOTE — Progress Notes (Signed)
   Subjective:    Patient ID: [name], male    DOB: 1931-07-16, 86 y.o.   MRN: [id number]  CC: 2 month follow up for BP.   HPI  Ms. Beaulieu is an 86 year old man with PMH of arthritis, gout, allergies, BPH and urethral stricture, CAD/PAD/ HFrEF, HTN, GERD, DM2 following with endocrinology who presents for follow up.   Ms. Heroux was seen by his endocrinologist Dr. Dwyane Dee on 08/09/19.  He is taking his insulin and metformin as prescribed.  He had an A1C of 8.2 at that time.  Given his age, this is likely an acceptable A1C for him.    He is up to date on his screening.  He is due for flu vaccine and I advised him to return in September to receive this vaccine.   He has one new complaint today which is aching back pain in his right lower back.  Does not radiate.  He notices it most when trying to get up from a seated position and when lying in bed.  He had no trauma, no change in activity that would explain it.  He has no change in urination, dysuria or hematuria as well.     Review of Systems  Constitutional: Negative for activity change, fatigue and fever.  Respiratory: Negative for cough and shortness of breath.   Cardiovascular: Negative for chest pain, palpitations and leg swelling.  Gastrointestinal: Negative for diarrhea and nausea.  Genitourinary: Negative for difficulty urinating, dysuria, enuresis, flank pain, frequency, hematuria and urgency.  Musculoskeletal: Positive for arthralgias and back pain. Negative for gait problem and joint swelling.  Skin: Negative for color change and rash.  Neurological: Negative for dizziness and weakness.  Psychiatric/Behavioral: Negative for decreased concentration and dysphoric mood.       Objective:   Physical Exam Vitals and nursing note reviewed.  Constitutional:      General: He is not in acute distress.    Appearance: Normal appearance. He is obese. He is not toxic-appearing.  HENT:     Head: Normocephalic and atraumatic.   Cardiovascular:     Rate and Rhythm: Normal rate and regular rhythm.     Heart sounds: No murmur heard.   Pulmonary:     Effort: Pulmonary effort is normal. No respiratory distress.     Breath sounds: Normal breath sounds. No wheezing.  Abdominal:     General: Bowel sounds are normal.     Palpations: Abdomen is soft.     Tenderness: There is no abdominal tenderness.     Hernia: A hernia (midline, chronic) is present.  Musculoskeletal:        General: No swelling or tenderness.     Right lower leg: No edema.     Left lower leg: No edema.     Comments: He has some muscle spasm in the lumbar spine on the right paraspinal muscles, no bony tenderness.  No CVA tenderness bilaterally.   Neurological:     Mental Status: He is alert and oriented to person, place, and time. Mental status is at baseline.  Psychiatric:        Mood and Affect: Mood normal.        Behavior: Behavior normal.      No labs today, reviewed previous.      Assessment & Plan:  Return in 3-4 months.  Continue follow up with endocrinology and cardiology.

## 2019-09-09 NOTE — Assessment & Plan Note (Signed)
No acute attacks.  He is taking allopurinol without issue.  His last UA was 5.5 which is at goal.   Plan Continue allopurinol chronically Continue colchicine PRN for attacks

## 2019-09-09 NOTE — Assessment & Plan Note (Signed)
Comorbidities include HTN and DM.  He also has CAD.  He has acute back pain today and provided with exercises.  Will continue counseling on nutrition and exercising.  He follows with our nutritionist.

## 2019-09-09 NOTE — Assessment & Plan Note (Signed)
Has followed with Dr. Gwenlyn Found in the past, he has no symptoms at this time.  He last saw Dr. Gwenlyn Found in November of 2020.  He is taking his aspirin and cilostazol without issues and these will be continued.

## 2019-09-09 NOTE — Chronic Care Management (AMB) (Signed)
Chronic Care Management   Follow Up Note   09/09/2019 Name: [name] [name] MRN: [id number] DOB: [birth year]  Referred by: [name], MD Reason for referral : Chronic Care Management (IDDM, HTN, CAD, HF, HLD)   [name] [name] is a 86 y.o. year old male who is a primary care patient of [name], MD. The CCM team was consulted for assistance with chronic disease management and care coordination needs.    Review of patient status, including review of consultants reports, relevant laboratory and other test results, and collaboration with appropriate care team members and the patient's provider was performed as part of comprehensive patient evaluation and provision of chronic care management services.    SDOH (Social Determinants of Health) assessments performed: No See Care Plan activities for detailed interventions related to [facility])     Outpatient Encounter Medications as of 09/09/2019  Medication Sig Note  . allopurinol (ZYLOPRIM) 100 MG tablet Take 2 tablets by mouth once daily   . amLODipine (NORVASC) 10 MG tablet Take 1 tablet by mouth once daily 07/22/2019: Missing some doses  . aspirin EC 81 MG tablet Take 1 tablet (81 mg total) by mouth daily.   [name] atorvastatin (LIPITOR) 40 MG tablet Take 1 tablet by mouth once daily   . Blood Glucose Monitoring Suppl (ONETOUCH VERIO FLEX SYSTEM) w/Device KIT Use OneTouch Verio Flex meter to check blood sugar twice daily.   . cilostazol (PLETAL) 50 MG tablet Take 1 tablet (50 mg total) by mouth 2 (two) times daily.   . colchicine 0.6 MG tablet Take 1 tablet (0.6 mg total) by mouth 2 (two) times daily as needed.   . diclofenac Sodium (VOLTAREN) 1 % GEL Apply 2 g topically 4 (four) times daily as needed (back pain).   . furosemide (LASIX) 20 MG tablet Take 1 tablet (20 mg total) by mouth daily.   [name] gabapentin (NEURONTIN) 100 MG capsule Take 1 capsule (100 mg total) by mouth 3 (three) times daily. 07/26/2019: Says he takes twice a day not  three times daily - doesn't want to take 3 times daily  . insulin NPH Human (HUMULIN N,NOVOLIN N) 100 UNIT/ML injection Inject 10 Units into the skin 2 (two) times daily.    . Insulin Regular Human (NOVOLIN R RELION IJ) Inject 15 Units as directed 2 (two) times daily before a meal.   . Insulin Syringe-Needle U-100 31G X 15/64" 0.3 ML MISC Use to inject insulin 3 times a day   . loratadine (ALLERGY RELIEF) 10 MG tablet Take 1 tablet (10 mg total) by mouth daily as needed for rhinitis.   [name] losartan (COZAAR) 50 MG tablet Take 1 tablet by mouth once daily   . metFORMIN (GLUCOPHAGE-XR) 500 MG 24 hr tablet TAKE 2 TABLETS BY MOUTH ONCE DAILY WITH SUPPER   . metoprolol tartrate (LOPRESSOR) 25 MG tablet Take 1 tablet by mouth twice daily 07/22/2019: Missing some doses  . ONETOUCH DELICA LANCETS FINE MISC Use to check blood sugar 2 times per day dx code E11.49   . ONETOUCH VERIO test strip USE AS DIRECTED TO CHECK BLOOD SUGAR TWICE DAILY   . oxybutynin (DITROPAN) 5 MG tablet Take 1 tablet (5 mg total) by mouth 3 (three) times daily.   . pantoprazole (PROTONIX) 40 MG tablet Take 1 tablet (40 mg total) by mouth daily.   . tamsulosin (FLOMAX) 0.4 MG CAPS capsule Take 1 capsule (0.4 mg total) by mouth daily. 07/22/2019: Missing some doses   No facility-administered encounter medications  on file as of 09/09/2019.     Objective:  Lab Results  Component Value Date   HGBA1C 8.2 (H) 08/09/2019   HGBA1C 7.5 (A) 05/11/2019   HGBA1C 7.2 (A) 02/10/2019   Lab Results  Component Value Date   MICROALBUR 3.5 (H) 08/09/2019   LDLCALC 46 08/09/2019   CREATININE 1.35 08/09/2019   Lab Results  Component Value Date   CHOL 103 08/09/2019   HDL 34.50 (L) 08/09/2019   LDLCALC 46 08/09/2019   TRIG 111.0 08/09/2019   CHOLHDL 3 08/09/2019   Goals Addressed              This Visit's Progress     Patient Stated   .  "I use a pill box and I take my medications like I'm supposed to." (pt-stated)        CARE PLAN  ENTRY (see longitudinal plan of care for additional care plan information)  Current Barriers:  . Chronic Disease Management support, education, and care coordination needs related to CHF, CAD, HTN, HLD, and DMII-met with patient in clinic, he requested assistance with making annual diabetic eye exam with [facility]  Clinical Goal(s) related to CHF, CAD, HTN, HLD, and DMII:  Over the next 30 -60 days, patient will:  . Work with the care management team to address educational, disease management, and care coordination needs  . Begin or continue self health monitoring activities as directed today  take medications as directed . Call provider office for new or worsened signs and symptoms Blood glucose findings outside established parameters and New or worsened symptom related to CHF, CAD, HTN, HLD, and DMII . Call care management team with questions or concerns . Verbalize basic understanding of patient centered plan of care established today  Interventions related to CHF, CAD, HTN, HLD, and DMII:  . Evaluation of current treatment plans and patient's adherence to plan as established by provider . Assessed patient understanding of disease states . Assessed patient's education and care coordination needs . Provided disease specific education to patient -  . Advised patient to call the customer service number on his insurance card to see if a home BP monitor is a covered benefit . Reminded patient he will need to write on the label of each refill bottle of medicine its indication . Collaborated with appropriate clinical care team members regarding patient needs- called [facility] and schedule patient's annual diabetic eye exam for 8/3/121 at 1:10 pm; provided patient with CCM RN's  business card with eye appointment  information on the back of the card  Patient Self Care Activities related to CHF, CAD, HTN, HLD, and DMII:  . Patient is unable to independently self-manage chronic health  conditions  Please see past updates related to this goal by clicking on the "Past Updates" button in the selected goal        Other   .  Blood Pressure < 130/80         BP Readings from Last 3 Encounters:  09/09/19 117/72  08/12/19 132/76  06/24/19 (!) 147/73   Meeting blood pressure targets    .  Weight (lb) < 200 lb (90.7 kg)        Wt Readings from Last 3 Encounters:  09/09/19 277 lb 4.8 oz (125.8 kg)  08/25/19 (!) 280 lb (127 kg)  08/12/19 280 lb 9.6 oz (127.3 kg)    Not meeting weight target        Plan:   The care  management team will reach out to the patient again over the next 30-60 days.    Kelli Churn RN, CCM, [facility] RN Care Manager [phone]

## 2019-09-09 NOTE — Assessment & Plan Note (Signed)
He is on chronic lasix.  His lower extremities are not edematous at this time.  He is having no SOB or chest pain.  He is well controlled at this time.   Plan Continue lasix, statin, aspirin and good BP control.

## 2019-09-09 NOTE — Assessment & Plan Note (Signed)
He takes loratadine without issue.   Plan Continue loratadine.

## 2019-09-09 NOTE — Assessment & Plan Note (Signed)
LDL at last check was 46.  He is on atorvastatin.   Plan Continue atorvastatin.

## 2019-09-09 NOTE — Assessment & Plan Note (Signed)
BP today is very well controlled.  His BP was 117/72.  He has no dizziness, lightheadedness or passing out.  He has been taking 3 drug regimen with amlodipine, losartan and metoprolol.  He has stable renal function from last blood work in July.   Plan Continue 3 drug regimen.

## 2019-09-09 NOTE — Patient Instructions (Signed)
[name] - - -  You are doing well!  Please keep taking your medications as prescribed.   For your back pain, I think you have a back spasm, more information below about stretches you can do.  I would advise you get something called Voltaren Gel.  I am going to send in a prescription.  The pharmacist may tell you that you need to get it over the counter.  Just ask at the pharmacy.   Come back to see me in 3-4 months, sooner if needed  Diclofenac skin gel (Voltaren Gel) What is this medicine? DICLOFENAC (dye KLOE fen ak) is a non-steroidal anti-inflammatory drug (NSAID). The 1% skin gel is used to treat osteoarthritis of the hands or knees. The 3% skin gel is used to treat actinic keratosis. This medicine may be used for other purposes; ask your health care provider or pharmacist if you have questions. COMMON BRAND NAME(S): DSG Pak, Omeca, Solaravix, Solaraze, Voltaren Arthritis, Voltaren Gel What should I tell my health care provider before I take this medicine? They need to know if you have any of these conditions:  asthma  bleeding problems  coronary artery bypass graft (CABG) surgery within the past 2 weeks  heart disease  high blood pressure  if you frequently drink alcohol containing drinks  kidney disease  liver disease  open or infected skin  stomach problems  an unusual or allergic reaction to diclofenac, aspirin, other NSAIDs, other medicines, benzyl alcohol (3% gel only), foods, dyes, or preservatives  pregnant or trying to get pregnant  breast-feeding How should I use this medicine? This medicine is for external use only. Follow the directions on the prescription label. Wash hands before and after use. Do not get this medicine in your eyes. If you do, rinse out with plenty of cool tap water. Use your doses at regular intervals. Do not use your medicine more often than directed. A special MedGuide will be given to you by the pharmacist with each prescription  and refill of the 1% gel. Be sure to read this information carefully each time. Talk to your pediatrician regarding the use of this medicine in children. Special care may be needed. The 3% gel is not approved for use in children. Overdosage: If you think you have taken too much of this medicine contact a poison control center or emergency room at once. NOTE: This medicine is only for you. Do not share this medicine with others. What if I miss a dose? If you miss a dose, use it as soon as you can. If it is almost time for your next dose, use only that dose. Do not use double or extra doses. What may interact with this medicine?  aspirin  NSAIDs, medicines for pain and inflammation, like ibuprofen or naproxen Do not use any other skin products without telling your doctor or health care professional. This list may not describe all possible interactions. Give your health care provider a list of all the medicines, herbs, non-prescription drugs, or dietary supplements you use. Also tell them if you smoke, drink alcohol, or use illegal drugs. Some items may interact with your medicine. What should I watch for while using this medicine? Tell your doctor or healthcare provider if your symptoms do not start to get better or if they get worse. You will need to follow up with your healthcare provider to monitor your progress. You may need to be treated for up to 3 months if you are using the 3%  gel, but the full effect may not occur until 1 month after stopping treatment. If you develop a severe skin reaction, contact your doctor or healthcare provider immediately. This medicine may cause serious skin reactions. They can happen weeks to months after starting the medicine. Contact your healthcare provider right away if you notice fevers or flu-like symptoms with a rash. The rash may be red or purple and then turn into blisters or peeling of the skin. Or, you might notice a red rash with swelling of the face, lips  or lymph nodes in your neck or under your arms. This medicine can make you more sensitive to the sun. Keep out of the sun. If you cannot avoid being in the sun, wear protective clothing and use sunscreen. Do not use sun lamps or tanning beds/booths. Do not take medicines such as ibuprofen and naproxen with this medicine. Side effects such as stomach upset, nausea, or ulcers may be more likely to occur. Many medicines available without a prescription should not be taken with this medicine. This medicine does not prevent heart attack or stroke. In fact, this medicine may increase the chance of a heart attack or stroke. The chance may increase with longer use of this medicine and in people who have heart disease. If you take aspirin to prevent heart attack or stroke, talk with your doctor or healthcare provider. This medicine can cause ulcers and bleeding in the stomach and intestines at any time during treatment. Do not smoke cigarettes or drink alcohol. These increase irritation to your stomach and can make it more susceptible to damage from this medicine. Ulcers and bleeding can happen without warning symptoms and can cause death. You may get drowsy or dizzy. Do not drive, use machinery, or do anything that needs mental alertness until you know how this medicine affects you. Do not stand or sit up quickly, especially if you are an older patient. This reduces the risk of dizzy or fainting spells. This medicine can cause you to bleed more easily. Try to avoid damage to your teeth and gums when you brush or floss your teeth. What side effects may I notice from receiving this medicine? Side effects that you should report to your doctor or health care professional as soon as possible:  allergic reactions like skin rash, itching or hives, swelling of the face, lips, or tongue  black or bloody stools, blood in the urine or vomit  blurred vision  chest pain  difficulty breathing or wheezing  nausea or  vomiting  rash, fever, and swollen lymph nodes  redness, blistering, peeling or loosening of the skin, including inside the mouth  slurred speech or weakness on one side of the body  trouble passing urine or change in the amount of urine  unexplained weight gain or swelling  unusually weak or tired  yellowing of eyes or skin Side effects that usually do not require medical attention (report to your doctor or health care professional if they continue or are bothersome):  dizziness  dry skin  headache  heartburn  increased sensitivity to the sun  stomach pain  tingling at the application site This list may not describe all possible side effects. Call your doctor for medical advice about side effects. You may report side effects to FDA at 1-800-FDA-1088. Where should I keep my medicine? Keep out of the reach of children. Store the 1% gel at room temperature between 15 and 30 degrees C (59 and 86 degrees F). Store the 3%  gel at room temperature between 20 and 25 degrees C (68 and 77 degrees F). Protect from light. Throw away any unused medicine after the expiration date. NOTE: This sheet is a summary. It may not cover all possible information. If you have questions about this medicine, talk to your doctor, pharmacist, or health care provider.  2020 Elsevier/Gold Standard (2018-04-07 13:05:18)    Back Exercises These exercises help to make your trunk and back strong. They also help to keep the lower back flexible. Doing these exercises can help to prevent back pain or lessen existing pain.  If you have back pain, try to do these exercises 2-3 times each day or as told by your doctor.  As you get better, do the exercises once each day. Repeat the exercises more often as told by your doctor.  To stop back pain from coming back, do the exercises once each day, or as told by your doctor. Exercises Single knee to chest Do these steps 3-5 times in a row for each leg: 1. Lie on  your back on a firm bed or the floor with your legs stretched out. 2. Bring one knee to your chest. 3. Grab your knee or thigh with both hands and hold them it in place. 4. Pull on your knee until you feel a gentle stretch in your lower back or buttocks. 5. Keep doing the stretch for 10-30 seconds. 6. Slowly let go of your leg and straighten it. Pelvic tilt Do these steps 5-10 times in a row: 1. Lie on your back on a firm bed or the floor with your legs stretched out. 2. Bend your knees so they point up to the ceiling. Your feet should be flat on the floor. 3. Tighten your lower belly (abdomen) muscles to press your lower back against the floor. This will make your tailbone point up to the ceiling instead of pointing down to your feet or the floor. 4. Stay in this position for 5-10 seconds while you gently tighten your muscles and breathe evenly. Cat-cow Do these steps until your lower back bends more easily: 1. Get on your hands and knees on a firm surface. Keep your hands under your shoulders, and keep your knees under your hips. You may put padding under your knees. 2. Let your head hang down toward your chest. Tighten (contract) the muscles in your belly. Point your tailbone toward the floor so your lower back becomes rounded like the back of a cat. 3. Stay in this position for 5 seconds. 4. Slowly lift your head. Let the muscles of your belly relax. Point your tailbone up toward the ceiling so your back forms a sagging arch like the back of a cow. 5. Stay in this position for 5 seconds.  Press-ups Do these steps 5-10 times in a row: 1. Lie on your belly (face-down) on the floor. 2. Place your hands near your head, about shoulder-width apart. 3. While you keep your back relaxed and keep your hips on the floor, slowly straighten your arms to raise the top half of your body and lift your shoulders. Do not use your back muscles. You may change where you place your hands in order to make  yourself more comfortable. 4. Stay in this position for 5 seconds. 5. Slowly return to lying flat on the floor.  Bridges Do these steps 10 times in a row: 1. Lie on your back on a firm surface. 2. Bend your knees so they point up to  the ceiling. Your feet should be flat on the floor. Your arms should be flat at your sides, next to your body. 3. Tighten your butt muscles and lift your butt off the floor until your waist is almost as high as your knees. If you do not feel the muscles working in your butt and the back of your thighs, slide your feet 1-2 inches farther away from your butt. 4. Stay in this position for 3-5 seconds. 5. Slowly lower your butt to the floor, and let your butt muscles relax. If this exercise is too easy, try doing it with your arms crossed over your chest. Belly crunches Do these steps 5-10 times in a row: 1. Lie on your back on a firm bed or the floor with your legs stretched out. 2. Bend your knees so they point up to the ceiling. Your feet should be flat on the floor. 3. Cross your arms over your chest. 4. Tip your chin a little bit toward your chest but do not bend your neck. 5. Tighten your belly muscles and slowly raise your chest just enough to lift your shoulder blades a tiny bit off of the floor. Avoid raising your body higher than that, because it can put too much stress on your low back. 6. Slowly lower your chest and your head to the floor. Back lifts Do these steps 5-10 times in a row: 1. Lie on your belly (face-down) with your arms at your sides, and rest your forehead on the floor. 2. Tighten the muscles in your legs and your butt. 3. Slowly lift your chest off of the floor while you keep your hips on the floor. Keep the back of your head in line with the curve in your back. Look at the floor while you do this. 4. Stay in this position for 3-5 seconds. 5. Slowly lower your chest and your face to the floor. Contact a doctor if:  Your back pain gets a  lot worse when you do an exercise.  Your back pain does not get better 2 hours after you exercise. If you have any of these problems, stop doing the exercises. Do not do them again unless your doctor says it is okay. Get help right away if:  You have sudden, very bad back pain. If this happens, stop doing the exercises. Do not do them again unless your doctor says it is okay. This information is not intended to replace advice given to you by your health care provider. Make sure you discuss any questions you have with your health care provider. Document Revised: 10/15/2017 Document Reviewed: 10/15/2017 Elsevier Patient Education  2020 [employer].

## 2019-09-09 NOTE — Assessment & Plan Note (Signed)
He has chronic shoulder pain and today comes in with what appears to be a lower back sprain vs. Spasm.  Given age, I did not prescribe a muscle relaxer at this time.  Will trial voltaren gel and back stretching and he will let me know if not better.  He did not have any signs/symptoms concerning for urinary tract infection at this time.   Plan Voltaren gel Heat/Ice Stretches provided.

## 2019-09-09 NOTE — Progress Notes (Signed)
Internal Medicine Clinic Resident  I have personally reviewed this encounter including the documentation in this note and/or discussed this patient with the care management provider. I will address any urgent items identified by the care management provider and will communicate my actions to the patient's PCP. I have reviewed the patient's CCM visit with my supervising attending, Dr Guilloud.  Shirlette Scarber M Aimie Wagman, MD 09/09/2019    

## 2019-10-03 ENCOUNTER — Telehealth: Payer: Self-pay | Admitting: *Deleted

## 2019-10-03 ENCOUNTER — Telehealth: Payer: Medicare (Managed Care)

## 2019-10-03 NOTE — Telephone Encounter (Signed)
  Chronic Care Management   Outreach Note  10/03/2019 Name: [name] [name] MRN: [id number] DOB: 25-Mar-1931  Referred by: [name], MD Reason for referral : Chronic Care Management (IDDM, HTN, CAD, HF, HLD)   An unsuccessful telephone outreach was attempted today to complete follow up assessment. . The patient was referred to the case management team for assistance with care management and care coordination. Wife answered home phone and states patient is not available as they have company. She requested a return call to patient at a later time but when ask for a preferred dated and/or time she did not give one.  Follow Up Plan: The care management team will reach out to the patient again over the next 7-10 days.  Kelli Churn RN, CCM, [facility] RN Care Manager [phone]

## 2019-10-04 ENCOUNTER — Ambulatory Visit: Payer: Medicare (Managed Care) | Admitting: *Deleted

## 2019-10-04 DIAGNOSIS — I10 Essential (primary) hypertension: Secondary | ICD-10-CM

## 2019-10-04 DIAGNOSIS — E1142 Type 2 diabetes mellitus with diabetic polyneuropathy: Secondary | ICD-10-CM

## 2019-10-04 DIAGNOSIS — I5022 Chronic systolic (congestive) heart failure: Secondary | ICD-10-CM

## 2019-10-04 DIAGNOSIS — I2510 Atherosclerotic heart disease of native coronary artery without angina pectoris: Secondary | ICD-10-CM

## 2019-10-04 NOTE — Chronic Care Management (AMB) (Signed)
Chronic Care Management   Follow Up Note   10/04/2019 Name: [name] [name] MRN: [id number] DOB: Nov 16, 1931  Referred by: [name] Catalina, MD Reason for referral : Chronic Care Management (IDDM, HTN, CAD, HF, HLD, PVD, Gout)   [name] is a 86 y.o. year old male who is a primary care patient of [name] Catalina, MD. The CCM team was consulted for assistance with chronic disease management and care coordination needs.    Review of patient status, including review of consultants reports, relevant laboratory and other test results, and collaboration with appropriate care team members and the patient's provider was performed as part of comprehensive patient evaluation and provision of chronic care management services.    SDOH (Social Determinants of Health) assessments performed: No See Care Plan activities for detailed interventions related to [facility])     Outpatient Encounter Medications as of 10/04/2019  Medication Sig Note  . allopurinol (ZYLOPRIM) 100 MG tablet Take 2 tablets by mouth once daily   . amLODipine (NORVASC) 10 MG tablet Take 1 tablet by mouth once daily 07/22/2019: Missing some doses  . aspirin EC 81 MG tablet Take 1 tablet (81 mg total) by mouth daily.   [name] [name] atorvastatin (LIPITOR) 40 MG tablet Take 1 tablet by mouth once daily   . Blood Glucose Monitoring Suppl (ONETOUCH VERIO FLEX SYSTEM) w/Device KIT Use OneTouch Verio Flex meter to check blood sugar twice daily.   . cilostazol (PLETAL) 50 MG tablet Take 1 tablet (50 mg total) by mouth 2 (two) times daily.   . colchicine 0.6 MG tablet Take 1 tablet (0.6 mg total) by mouth 2 (two) times daily as needed.   . diclofenac Sodium (VOLTAREN) 1 % GEL Apply 2 g topically 4 (four) times daily as needed (back pain).   . furosemide (LASIX) 20 MG tablet Take 1 tablet (20 mg total) by mouth daily.   [name] [name] gabapentin (NEURONTIN) 100 MG capsule Take 1 capsule (100 mg total) by mouth 3 (three) times daily. 07/26/2019: Says he takes  twice a day not three times daily - doesn't want to take 3 times daily  . insulin NPH Human (HUMULIN N,NOVOLIN N) 100 UNIT/ML injection Inject 10 Units into the skin 2 (two) times daily.    . Insulin Regular Human (NOVOLIN R RELION IJ) Inject 15 Units as directed 2 (two) times daily before a meal.   . Insulin Syringe-Needle U-100 31G X 15/64" 0.3 ML MISC Use to inject insulin 3 times a day   . loratadine (ALLERGY RELIEF) 10 MG tablet Take 1 tablet (10 mg total) by mouth daily as needed for rhinitis.   [name] [name] losartan (COZAAR) 50 MG tablet Take 1 tablet by mouth once daily   . metFORMIN (GLUCOPHAGE-XR) 500 MG 24 hr tablet TAKE 2 TABLETS BY MOUTH ONCE DAILY WITH SUPPER   . metoprolol tartrate (LOPRESSOR) 25 MG tablet Take 1 tablet by mouth twice daily 07/22/2019: Missing some doses  . ONETOUCH DELICA LANCETS FINE MISC Use to check blood sugar 2 times per day dx code E11.49   . ONETOUCH VERIO test strip USE AS DIRECTED TO CHECK BLOOD SUGAR TWICE DAILY   . oxybutynin (DITROPAN) 5 MG tablet Take 1 tablet (5 mg total) by mouth 3 (three) times daily.   . pantoprazole (PROTONIX) 40 MG tablet Take 1 tablet (40 mg total) by mouth daily.   . tamsulosin (FLOMAX) 0.4 MG CAPS capsule Take 1 capsule (0.4 mg total) by mouth daily. 07/22/2019: Missing some doses   No facility-administered  encounter medications on file as of 10/04/2019.     Objective:  BP Readings from Last 3 Encounters:  09/09/19 117/72  08/12/19 132/76  06/24/19 (!) 147/73   Wt Readings from Last 3 Encounters:  09/09/19 277 lb 4.8 oz (125.8 kg)  08/25/19 (!) 280 lb (127 kg)  08/12/19 280 lb 9.6 oz (127.3 kg)   Lab Results  Component Value Date   HGBA1C 8.2 (H) 08/09/2019   HGBA1C 7.5 (A) 05/11/2019   HGBA1C 7.2 (A) 02/10/2019   Lab Results  Component Value Date   MICROALBUR 3.5 (H) 08/09/2019   LDLCALC 46 08/09/2019   CREATININE 1.35 08/09/2019    Goals Addressed              This Visit's Progress     Patient Stated   .   "I'm confused about my blood pressure pills" (pt-stated)        CARE PLAN ENTRY (see longitudinal plan of care for additional care plan information)  Current Barriers:  [name] [name] Knowledge Deficits related to blood pressure medications- patient states he is confused by the number of medications he is taking for his blood pressure and wants to be sure he is following his providers instructions, he asked to meet with this CCM RN in the clinic rather than discuss on the phone because he says he has a hard time hearing when he's on the phone. He says he will have his annual diabetes eye exam this morning at 10:30 am  Nurse Case Manager Clinical Goal(s):  [name] [name] Over the next 7 days, patient will demonstrate understanding of rationale for each prescribed medication as evidenced by voicing understanding of medications used to treat HTN and good medication taking behavior as evidenced by patient report and BP meeting targets.   Interventions:  . Inter-disciplinary care team collaboration (see longitudinal plan of care) . At patient's request scheduled face to face clinic appointment to discuss medications used to treat blood pressure for 10/05/19 at 2:30 pm  Patient Self Care Activities:  . Patient verbalizes understanding of plan to meet with CCM RN to clarify HTN treatment regimen . Unable to independently determine medications used to treat HTN  Initial goal documentation         Plan:   Face to Face appointment with care management team member scheduled for: 10/05/19 at 2:30 pm   Kelli Churn RN, CCM, [facility] RN Care Manager [phone]

## 2019-10-04 NOTE — Progress Notes (Signed)
Internal Medicine Clinic Resident  I have personally reviewed this encounter including the documentation in this note and/or discussed this patient with the care management provider. I will address any urgent items identified by the care management provider and will communicate my actions to the patient's PCP. I have reviewed the patient's CCM visit with my supervising attending, Dr Raines.  Briony Parveen, MD 10/04/2019    

## 2019-10-05 ENCOUNTER — Ambulatory Visit: Payer: Medicare (Managed Care)

## 2019-10-05 NOTE — Progress Notes (Signed)
Internal Medicine Clinic Attending  CCM services provided by the care management provider and their documentation were reviewed with Dr. Jinwala.  We reviewed the pertinent findings, urgent action items addressed by the resident and non-urgent items to be addressed by the PCP.  I agree with the assessment, diagnosis, and plan of care documented in the CCM and resident's note.  Bryce Cheever N Roseland Braun, MD 10/05/2019  

## 2019-10-07 ENCOUNTER — Ambulatory Visit: Payer: Medicare (Managed Care) | Admitting: *Deleted

## 2019-10-07 DIAGNOSIS — I5022 Chronic systolic (congestive) heart failure: Secondary | ICD-10-CM

## 2019-10-07 DIAGNOSIS — E1142 Type 2 diabetes mellitus with diabetic polyneuropathy: Secondary | ICD-10-CM

## 2019-10-07 DIAGNOSIS — I2510 Atherosclerotic heart disease of native coronary artery without angina pectoris: Secondary | ICD-10-CM

## 2019-10-07 DIAGNOSIS — I10 Essential (primary) hypertension: Secondary | ICD-10-CM

## 2019-10-07 NOTE — Progress Notes (Addendum)
Internal Medicine Clinic Resident  I have personally reviewed this encounter including the documentation in this note and/or discussed this patient with the care management provider. I will address any urgent items identified by the care management provider and will communicate my actions to the patient's PCP. I have reviewed the patient's CCM visit with my supervising attending, Dr Jimmye Norman.  Jean Rosenthal, MD 10/07/2019   Dorian Pod, MD Internal Medicine

## 2019-10-07 NOTE — Chronic Care Management (AMB) (Signed)
Chronic Care Management   Follow Up Note   10/07/2019 Name: [name] [name] MRN: [id number] DOB: [birth year]  Referred by: [name], MD Reason for referral : Chronic Care Management (IDDM, HTN, CAD, HF, HLD, PVD, Gout)   [name] [name] is a 86 y.o. year old male who is a primary care patient of [name], MD. The CCM team was consulted for assistance with chronic disease management and care coordination needs.    Review of patient status, including review of consultants reports, relevant laboratory and other test results, and collaboration with appropriate care team members and the patient's provider was performed as part of comprehensive patient evaluation and provision of chronic care management services.    SDOH (Social Determinants of Health) assessments performed: No See Care Plan activities for detailed interventions related to [facility])     Outpatient Encounter Medications as of 10/07/2019  Medication Sig Note  . allopurinol (ZYLOPRIM) 100 MG tablet Take 2 tablets by mouth once daily   . amLODipine (NORVASC) 10 MG tablet Take 1 tablet by mouth once daily 07/22/2019: Missing some doses  . aspirin EC 81 MG tablet Take 1 tablet (81 mg total) by mouth daily.   [name] atorvastatin (LIPITOR) 40 MG tablet Take 1 tablet by mouth once daily   . Blood Glucose Monitoring Suppl (ONETOUCH VERIO FLEX SYSTEM) w/Device KIT Use OneTouch Verio Flex meter to check blood sugar twice daily.   . cilostazol (PLETAL) 50 MG tablet Take 1 tablet (50 mg total) by mouth 2 (two) times daily.   . colchicine 0.6 MG tablet Take 1 tablet (0.6 mg total) by mouth 2 (two) times daily as needed.   . diclofenac Sodium (VOLTAREN) 1 % GEL Apply 2 g topically 4 (four) times daily as needed (back pain).   . furosemide (LASIX) 20 MG tablet Take 1 tablet (20 mg total) by mouth daily.   [name] gabapentin (NEURONTIN) 100 MG capsule Take 1 capsule (100 mg total) by mouth 3 (three) times daily. 07/26/2019: Says he takes twice  a day not three times daily - doesn't want to take 3 times daily  . insulin NPH Human (HUMULIN N,NOVOLIN N) 100 UNIT/ML injection Inject 10 Units into the skin 2 (two) times daily.    . Insulin Regular Human (NOVOLIN R RELION IJ) Inject 15 Units as directed 2 (two) times daily before a meal.   . Insulin Syringe-Needle U-100 31G X 15/64" 0.3 ML MISC Use to inject insulin 3 times a day   . loratadine (ALLERGY RELIEF) 10 MG tablet Take 1 tablet (10 mg total) by mouth daily as needed for rhinitis.   [name] losartan (COZAAR) 50 MG tablet Take 1 tablet by mouth once daily   . metFORMIN (GLUCOPHAGE-XR) 500 MG 24 hr tablet TAKE 2 TABLETS BY MOUTH ONCE DAILY WITH SUPPER   . metoprolol tartrate (LOPRESSOR) 25 MG tablet Take 1 tablet by mouth twice daily 07/22/2019: Missing some doses  . ONETOUCH DELICA LANCETS FINE MISC Use to check blood sugar 2 times per day dx code E11.49   . ONETOUCH VERIO test strip USE AS DIRECTED TO CHECK BLOOD SUGAR TWICE DAILY   . oxybutynin (DITROPAN) 5 MG tablet Take 1 tablet (5 mg total) by mouth 3 (three) times daily.   . pantoprazole (PROTONIX) 40 MG tablet Take 1 tablet (40 mg total) by mouth daily.   . tamsulosin (FLOMAX) 0.4 MG CAPS capsule Take 1 capsule (0.4 mg total) by mouth daily. 07/22/2019: Missing some doses   No facility-administered  encounter medications on file as of 10/07/2019.     Objective:  Wt Readings from Last 3 Encounters:  09/09/19 277 lb 4.8 oz (125.8 kg)  08/25/19 (!) 280 lb (127 kg)  08/12/19 280 lb 9.6 oz (127.3 kg)   Lab Results  Component Value Date   HGBA1C 8.2 (H) 08/09/2019   HGBA1C 7.5 (A) 05/11/2019   HGBA1C 7.2 (A) 02/10/2019   Lab Results  Component Value Date   MICROALBUR 3.5 (H) 08/09/2019   LDLCALC 46 08/09/2019   CREATININE 1.35 08/09/2019    Goals Addressed              This Visit's Progress     Patient Stated   .  COMPLETED: "I'm confused about my blood pressure pills" (pt-stated)        CARE PLAN ENTRY (see  longitudinal plan of care for additional care plan information)  Current Barriers:  [name] Knowledge Deficits related to blood pressure medications- patient was a no show for the 10/05/19 appointment in the clinic that he requested to meet with this CCM RN to review medications, especially blood pressure pills, reached patient by phone, he did not remember making the appointment and says he got his BP pills "straightened out", he did not want to review them over the phone because he says he has a hard time hearing when he's on the phone.  Nurse Case Manager Clinical Goal(s):  [name] Over the next 7 days, patient will demonstrate understanding of rationale for each prescribed medication as evidenced by voicing understanding of medications used to treat HTN and good medication taking behavior as evidenced by patient report and BP meeting targets.   Interventions:  . Inter-disciplinary care team collaboration (see longitudinal plan of care) . Encouraged patient to call this CM RN if he has questions in the future about his pills or if he wants to meet face to face to discuss any chronic health issues  Patient Self Care Activities:  . Patient verbalizes understanding of plan to meet with CCM RN to clarify HTN treatment regimen . Unable to independently determine medications used to treat HTN  Please see past updates related to this goal by clicking on the "Past Updates" button in the selected goal          Plan:   The care management team will reach out to the patient again over the next 30-60 days.    Kelli Churn RN, CCM, [facility] RN Care Manager [phone]

## 2019-10-07 NOTE — Patient Instructions (Signed)
Visit Information Please call me if you have any questions about your medications  or health issues.  Goals Addressed              This Visit's Progress     Patient Stated   .  COMPLETED: "I'm confused about my blood pressure pills" (pt-stated)        CARE PLAN ENTRY (see longitudinal plan of care for additional care plan information)  Current Barriers:  [name] Knowledge Deficits related to blood pressure medications- patient was a no show for the 10/05/19 appointment in the clinic that he requested to meet with this CCM RN to review medications, especially blood pressure pills, reached patient by phone, he did not remember making the appointment and says he got his BP pills "straightened out", he did not want to review them over the phone because he says he has a hard time hearing when he's on the phone.  Nurse Case Manager Clinical Goal(s):  [name] Over the next 7 days, patient will demonstrate understanding of rationale for each prescribed medication as evidenced by voicing understanding of medications used to treat HTN and good medication taking behavior as evidenced by patient report and BP meeting targets.   Interventions:  . Inter-disciplinary care team collaboration (see longitudinal plan of care) . Encouraged patient to call this CM RN if he has questions in the future about his pills or if he wants to meet face to face to discuss any chronic health issues  Patient Self Care Activities:  . Patient verbalizes understanding of plan to meet with CCM RN to clarify HTN treatment regimen . Unable to independently determine medications used to treat HTN  Please see past updates related to this goal by clicking on the "Past Updates" button in the selected goal         The patient verbalized understanding of instructions provided today and declined a print copy of patient instruction materials.   The care management team will reach out to the patient again over the next 30-60 days.   Kelli Churn RN, CCM, [facility] RN Care Manager [phone]

## 2019-11-04 ENCOUNTER — Other Ambulatory Visit: Payer: Self-pay | Admitting: Endocrinology

## 2019-11-04 ENCOUNTER — Ambulatory Visit: Payer: Medicare (Managed Care) | Admitting: *Deleted

## 2019-11-04 DIAGNOSIS — I2510 Atherosclerotic heart disease of native coronary artery without angina pectoris: Secondary | ICD-10-CM

## 2019-11-04 DIAGNOSIS — E1142 Type 2 diabetes mellitus with diabetic polyneuropathy: Secondary | ICD-10-CM

## 2019-11-04 DIAGNOSIS — I739 Peripheral vascular disease, unspecified: Secondary | ICD-10-CM

## 2019-11-04 DIAGNOSIS — I5022 Chronic systolic (congestive) heart failure: Secondary | ICD-10-CM

## 2019-11-04 DIAGNOSIS — I10 Essential (primary) hypertension: Secondary | ICD-10-CM

## 2019-11-04 NOTE — Progress Notes (Addendum)
Internal Medicine Clinic Resident  I have personally reviewed this encounter including the documentation in this note and/or discussed this patient with the care management provider. I will address any urgent items identified by the care management provider and will communicate my actions to the patient's PCP. I have reviewed the patient's CCM visit with my supervising attending, Dr Williams.  Ryllie Nieland, MD  [facility] PGY-2 11/04/2019   DOS 11/04/2019 Internal Medicine Attending: I reviewed this case and agree with documentation. Julie Williams, MD    

## 2019-11-04 NOTE — Chronic Care Management (AMB) (Signed)
Chronic Care Management   Follow Up Note   11/04/2019 Name: [name] [name] MRN: [id number] DOB: 31-Dec-1931  Referred by: [name], MD Reason for referral : Chronic Care Management ( IDDM, HTN, CAD, HF, HLD, PVD, Gout)   [name] [name] is a 86 y.o. year old male who is a primary care patient of [name], MD. The CCM team was consulted for assistance with chronic disease management and care coordination needs.    Review of patient status, including review of consultants reports, relevant laboratory and other test results, and collaboration with appropriate care team members and the patient's provider was performed as part of comprehensive patient evaluation and provision of chronic care management services.    SDOH (Social Determinants of Health) assessments performed: No See Care Plan activities for detailed interventions related to [name])     Outpatient Encounter Medications as of 11/04/2019  Medication Sig Note   allopurinol (ZYLOPRIM) 100 MG tablet Take 2 tablets by mouth once daily    amLODipine (NORVASC) 10 MG tablet Take 1 tablet by mouth once daily 07/22/2019: Missing some doses   aspirin EC 81 MG tablet Take 1 tablet (81 mg total) by mouth daily.    atorvastatin (LIPITOR) 40 MG tablet Take 1 tablet by mouth once daily    Blood Glucose Monitoring Suppl (ONETOUCH VERIO FLEX SYSTEM) w/Device KIT Use OneTouch Verio Flex meter to check blood sugar twice daily.    cilostazol (PLETAL) 50 MG tablet Take 1 tablet (50 mg total) by mouth 2 (two) times daily.    colchicine 0.6 MG tablet Take 1 tablet (0.6 mg total) by mouth 2 (two) times daily as needed.    diclofenac Sodium (VOLTAREN) 1 % GEL Apply 2 g topically 4 (four) times daily as needed (back pain).    furosemide (LASIX) 20 MG tablet Take 1 tablet (20 mg total) by mouth daily.    gabapentin (NEURONTIN) 100 MG capsule Take 1 capsule (100 mg total) by mouth 3 (three) times daily. 07/26/2019: Says he takes  twice a day not three times daily - doesn't want to take 3 times daily   insulin NPH Human (HUMULIN N,NOVOLIN N) 100 UNIT/ML injection Inject 10 Units into the skin 2 (two) times daily.     Insulin Regular Human (NOVOLIN R RELION IJ) Inject 15 Units as directed 2 (two) times daily before a meal.    Insulin Syringe-Needle U-100 31G X 15/64" 0.3 ML MISC Use to inject insulin 3 times a day    loratadine (ALLERGY RELIEF) 10 MG tablet Take 1 tablet (10 mg total) by mouth daily as needed for rhinitis.    losartan (COZAAR) 50 MG tablet Take 1 tablet by mouth once daily    metFORMIN (GLUCOPHAGE-XR) 500 MG 24 hr tablet TAKE 2 TABLETS BY MOUTH ONCE DAILY WITH SUPPER    metoprolol tartrate (LOPRESSOR) 25 MG tablet Take 1 tablet by mouth twice daily 07/22/2019: Missing some doses   ONETOUCH DELICA LANCETS FINE MISC Use to check blood sugar 2 times per day dx code E11.49    ONETOUCH VERIO test strip USE AS DIRECTED TO CHECK BLOOD SUGAR TWICE DAILY    oxybutynin (DITROPAN) 5 MG tablet Take 1 tablet (5 mg total) by mouth 3 (three) times daily.    pantoprazole (PROTONIX) 40 MG tablet Take 1 tablet (40 mg total) by mouth daily.    tamsulosin (FLOMAX) 0.4 MG CAPS capsule Take 1 capsule (0.4 mg total) by mouth daily. 07/22/2019: Missing some doses   No  facility-administered encounter medications on file as of 11/04/2019.     Objective:  Lab Results  Component Value Date   HGBA1C 8.2 (H) 08/09/2019   HGBA1C 7.5 (A) 05/11/2019   HGBA1C 7.2 (A) 02/10/2019   Lab Results  Component Value Date   MICROALBUR 3.5 (H) 08/09/2019   LDLCALC 46 08/09/2019   CREATININE 1.35 08/09/2019   BP Readings from Last 3 Encounters:  09/09/19 117/72  08/12/19 132/76  06/24/19 (!) 147/73   Wt Readings from Last 3 Encounters:  09/09/19 277 lb 4.8 oz (125.8 kg)  08/25/19 (!) 280 lb (127 kg)  08/12/19 280 lb 9.6 oz (127.3 kg)   Goals Addressed              This Visit's Progress     Patient Stated     " I  need you to help me find an eye doctor that will take my health insurance." (pt-stated)        CARE PLAN ENTRY (see longitudinal plan of care for additional care plan information)  Current Barriers:   Care Coordination needs related to securing eye exam appointment  in a patient with IDDM, HTN, CAD, HF, HLD- patient states when he went to [facility] for his appointment om 8/31/2 that this CCM RN arranged they told him they did not take his health insurance,  he says he needs new glasses because he is getting frequent headaches because he can't see well  Nurse Case Manager Clinical Goal(s):   Over the next 7-10 days, patient will work with [facility] RN  to address needs related to securing eye exam appointment with in network provider.  Interventions:   Inter-disciplinary care team collaboration (see longitudinal plan of care)  Advised patient this CCM RN will call his health plan to determine in network ophthalmologist.   Patient Self Care Activities:   Patient verbalizes understanding of plan to work with CCM RN to arrange eye exam  Self administers medications as prescribed  Attends all scheduled provider appointments  Calls pharmacy for medication refills  Performs ADL's independently  Performs IADL's independently  Calls provider office for new concerns or questions  Patient will complete eye exam once appointment has been made with in network provider  Unable to independently make eye exam appointment with in network provider  Initial goal documentation         Plan:   The care management team will reach out to the patient again over the next 7-10 days.   Kelli Churn RN, CCM, [facility] RN Care Manager [phone]

## 2019-11-04 NOTE — Patient Instructions (Signed)
Visit Information It was nice speaking with you today. I will call you when I have determine an eye doctor that takes you medical insurance. Goals Addressed              This Visit's Progress     Patient Stated   .  " I need you to help me find an eye doctor that will take my health insurance." (pt-stated)        CARE PLAN ENTRY (see longitudinal plan of care for additional care plan information)  Current Barriers:  . Care Coordination needs related to securing eye exam appointment  in a patient with IDDM, HTN, CAD, HF, HLD- patient states when he went to [facility] for his appointment om 8/31/2 that this CCM RN arranged they told him they did not take his health insurance,  he says he needs new glasses because he is getting frequent headaches because he can't see well  Nurse Case Manager Clinical Goal(s):  [name] Over the next 7-10 days, patient will work with [facility] RN  to address needs related to securing eye exam appointment with in network provider.  Interventions:  . Inter-disciplinary care team collaboration (see longitudinal plan of care) . Advised patient this CCM RN will call his health plan to determine in network ophthalmologist.   Patient Self Care Activities:  . Patient verbalizes understanding of plan to work with CCM RN to arrange eye exam . Self administers medications as prescribed . Attends all scheduled provider appointments . Calls pharmacy for medication refills . Performs ADL's independently . Performs IADL's independently . Calls provider office for new concerns or questions . Patient will complete eye exam once appointment has been made with in network provider . Unable to independently make eye exam appointment with in network provider  Initial goal documentation        The patient verbalized understanding of instructions provided today and declined a print copy of patient instruction materials.   The care management team will reach out to the patient  again over the next 7-10 days.   Kelli Churn RN, CCM, [facility] RN Care Manager [phone]

## 2019-11-07 ENCOUNTER — Ambulatory Visit: Payer: Medicare (Managed Care) | Admitting: *Deleted

## 2019-11-07 DIAGNOSIS — I2510 Atherosclerotic heart disease of native coronary artery without angina pectoris: Secondary | ICD-10-CM

## 2019-11-07 DIAGNOSIS — I5022 Chronic systolic (congestive) heart failure: Secondary | ICD-10-CM

## 2019-11-07 DIAGNOSIS — E1142 Type 2 diabetes mellitus with diabetic polyneuropathy: Secondary | ICD-10-CM

## 2019-11-07 DIAGNOSIS — I10 Essential (primary) hypertension: Secondary | ICD-10-CM

## 2019-11-07 NOTE — Patient Instructions (Signed)
Visit Information I will mail you the list of in network eye doctors so you can make an appointment. Goals Addressed              This Visit's Progress     Patient Stated   .  " I need you to help me find an eye doctor that will take my health insurance." (pt-stated)        CARE PLAN ENTRY (see longitudinal plan of care for additional care plan information)  Current Barriers:  . Care Coordination needs related to securing eye exam appointment  in a patient with IDDM, HTN, CAD, HF, HLD- spoke with patient about in network ophthalmologist   Nurse Case Manager Clinical Goal(s):  [name] Over the next 7-10 days, patient will work with [facility] RN  to address needs related to securing eye exam appointment with in network provider.  Interventions:  . Inter-disciplinary care team collaboration (see longitudinal plan of care) . Using [email or web address]  web site searched for in network ophthalmologist with in 10 mile radius of patient's zip code and there were 13 providers . Spoke with patient via phone and with his agreement , will mail the list of in network ophthalmologist to his home address  Patient Self Care Activities:  . Patient verbalizes understanding of plan to work with CCM RN to arrange eye exam . Self administers medications as prescribed . Attends all scheduled provider appointments . Calls pharmacy for medication refills . Performs ADL's independently . Performs IADL's independently . Calls provider office for new concerns or questions . Patient will complete eye exam once appointment has been made with in network provider . Unable to independently make eye exam appointment with in network provider  Please see past updates related to this goal by clicking on the "Past Updates" button in the selected goal         The patient verbalized understanding of instructions provided today and declined a print copy of patient instruction materials.   The care management team will  reach out to the patient again over the next 30-60 days.   Kelli Churn RN, CCM, [facility] RN Care Manager [phone]

## 2019-11-07 NOTE — Progress Notes (Signed)
Internal Medicine Clinic Resident  I have personally reviewed this encounter including the documentation in this note and/or discussed this patient with the care management provider. I will address any urgent items identified by the care management provider and will communicate my actions to the patient's PCP. I have reviewed the patient's CCM visit with my supervising attending, Dr Mullen.  Jilberto Vanderwall, MD  [facility] PGY-2 11/07/2019    

## 2019-11-07 NOTE — Chronic Care Management (AMB) (Signed)
Chronic Care Management   Follow Up Note   11/07/2019 Name: [name] [name] MRN: [id number] DOB: [birth year]  Referred by: Inez Catalina, MD Reason for referral : Chronic Care Management ( IDDM, HTN, CAD, HF, HLD, PVD, Gout)   [name] [name] is a 86 y.o. year old male who is a primary care patient of Inez Catalina, MD. The CCM team was consulted for assistance with chronic disease management and care coordination needs.    Review of patient status, including review of consultants reports, relevant laboratory and other test results, and collaboration with appropriate care team members and the patient's provider was performed as part of comprehensive patient evaluation and provision of chronic care management services.    SDOH (Social Determinants of Health) assessments performed: No See Care Plan activities for detailed interventions related to [facility])     Outpatient Encounter Medications as of 11/07/2019  Medication Sig Note  . allopurinol (ZYLOPRIM) 100 MG tablet Take 2 tablets by mouth once daily   . amLODipine (NORVASC) 10 MG tablet Take 1 tablet by mouth once daily 07/22/2019: Missing some doses  . aspirin EC 81 MG tablet Take 1 tablet (81 mg total) by mouth daily.   [name] atorvastatin (LIPITOR) 40 MG tablet Take 1 tablet by mouth once daily   . Blood Glucose Monitoring Suppl (ONETOUCH VERIO FLEX SYSTEM) w/Device KIT Use OneTouch Verio Flex meter to check blood sugar twice daily.   . cilostazol (PLETAL) 50 MG tablet Take 1 tablet (50 mg total) by mouth 2 (two) times daily.   . colchicine 0.6 MG tablet Take 1 tablet (0.6 mg total) by mouth 2 (two) times daily as needed.   . diclofenac Sodium (VOLTAREN) 1 % GEL Apply 2 g topically 4 (four) times daily as needed (back pain).   . furosemide (LASIX) 20 MG tablet Take 1 tablet (20 mg total) by mouth daily.   [name] gabapentin (NEURONTIN) 100 MG capsule Take 1 capsule (100 mg total) by mouth 3 (three) times daily. 07/26/2019: Says he takes  twice a day not three times daily - doesn't want to take 3 times daily  . insulin NPH Human (HUMULIN N,NOVOLIN N) 100 UNIT/ML injection Inject 10 Units into the skin 2 (two) times daily.    . Insulin Regular Human (NOVOLIN R RELION IJ) Inject 15 Units as directed 2 (two) times daily before a meal.   . Insulin Syringe-Needle U-100 31G X 15/64" 0.3 ML MISC Use to inject insulin 3 times a day   . loratadine (ALLERGY RELIEF) 10 MG tablet Take 1 tablet (10 mg total) by mouth daily as needed for rhinitis.   [name] losartan (COZAAR) 50 MG tablet Take 1 tablet by mouth once daily   . metFORMIN (GLUCOPHAGE-XR) 500 MG 24 hr tablet TAKE 2 TABLETS BY MOUTH ONCE DAILY WITH SUPPER   . metoprolol tartrate (LOPRESSOR) 25 MG tablet Take 1 tablet by mouth twice daily 07/22/2019: Missing some doses  . ONETOUCH DELICA LANCETS FINE MISC Use to check blood sugar 2 times per day dx code E11.49   . ONETOUCH VERIO test strip USE AS DIRECTED TO CHECK BLOOD SUGAR TWICE DAILY   . oxybutynin (DITROPAN) 5 MG tablet Take 1 tablet (5 mg total) by mouth 3 (three) times daily.   . pantoprazole (PROTONIX) 40 MG tablet Take 1 tablet (40 mg total) by mouth daily.   . tamsulosin (FLOMAX) 0.4 MG CAPS capsule Take 1 capsule (0.4 mg total) by mouth daily. 07/22/2019: Missing some doses   No  facility-administered encounter medications on file as of 11/07/2019.     Objective:  BP Readings from Last 3 Encounters:  09/09/19 117/72  08/12/19 132/76  06/24/19 (!) 147/73   Lab Results  Component Value Date   HGBA1C 8.2 (H) 08/09/2019   HGBA1C 7.5 (A) 05/11/2019   HGBA1C 7.2 (A) 02/10/2019   Lab Results  Component Value Date   MICROALBUR 3.5 (H) 08/09/2019   LDLCALC 46 08/09/2019   CREATININE 1.35 08/09/2019   Lab Results  Component Value Date   CHOL 103 08/09/2019   HDL 34.50 (L) 08/09/2019   LDLCALC 46 08/09/2019   TRIG 111.0 08/09/2019   CHOLHDL 3 08/09/2019    Goals Addressed              This Visit's Progress      Patient Stated   .  " I need you to help me find an eye doctor that will take my health insurance." (pt-stated)        CARE PLAN ENTRY (see longitudinal plan of care for additional care plan information)  Current Barriers:  . Care Coordination needs related to securing eye exam appointment  in a patient with IDDM, HTN, CAD, HF, HLD- spoke with patient about in network ophthalmologist   Nurse Case Manager Clinical Goal(s):  [name] Over the next 7-10 days, patient will work with [facility] RN  to address needs related to securing eye exam appointment with in network provider.  Interventions:  . Inter-disciplinary care team collaboration (see longitudinal plan of care) . Using [email or web address]  web site searched for in network ophthalmologist with in 10 mile radius of patient's zip code and there were 13 providers . Spoke with patient via phone and with his agreement , will mail the list of in network ophthalmologist to his home address  Patient Self Care Activities:  . Patient verbalizes understanding of plan to work with CCM RN to arrange eye exam . Self administers medications as prescribed . Attends all scheduled provider appointments . Calls pharmacy for medication refills . Performs ADL's independently . Performs IADL's independently . Calls provider office for new concerns or questions . Patient will complete eye exam once appointment has been made with in network provider . Unable to independently make eye exam appointment with in network provider  Please see past updates related to this goal by clicking on the "Past Updates" button in the selected goal          Plan:   The care management team will reach out to the patient again over the next 30-60 days.    Kelli Churn RN, CCM, [facility] RN Care Manager [phone]

## 2019-11-07 NOTE — Progress Notes (Signed)
Internal Medicine Clinic Attending  CCM services provided by the care management provider and their documentation were discussed with Dr. Aslam. We reviewed the pertinent findings, urgent action items addressed by the resident and non-urgent items to be addressed by the PCP.  I agree with the assessment, diagnosis, and plan of care documented in the CCM and resident's note.  Matix Henshaw, MD 11/07/2019  

## 2019-11-14 ENCOUNTER — Other Ambulatory Visit: Payer: Medicare (Managed Care)

## 2019-11-16 ENCOUNTER — Other Ambulatory Visit: Payer: Self-pay

## 2019-11-16 ENCOUNTER — Other Ambulatory Visit (INDEPENDENT_AMBULATORY_CARE_PROVIDER_SITE_OTHER): Payer: Medicare (Managed Care)

## 2019-11-16 DIAGNOSIS — Z794 Long term (current) use of insulin: Secondary | ICD-10-CM

## 2019-11-16 DIAGNOSIS — E1165 Type 2 diabetes mellitus with hyperglycemia: Secondary | ICD-10-CM

## 2019-11-16 LAB — BASIC METABOLIC PANEL
BUN: 24 mg/dL — ABNORMAL HIGH (ref 6–23)
CO2: 28 mEq/L (ref 19–32)
Calcium: 9 mg/dL (ref 8.4–10.5)
Chloride: 105 mEq/L (ref 96–112)
Creatinine, Ser: 1.53 mg/dL — ABNORMAL HIGH (ref 0.40–1.50)
GFR: 40.54 mL/min — ABNORMAL LOW (ref >60.00)
Glucose, Bld: 141 mg/dL — ABNORMAL HIGH (ref 70–99)
Potassium: 3.7 mEq/L (ref 3.5–5.1)
Sodium: 140 mEq/L (ref 135–145)

## 2019-11-16 LAB — HEMOGLOBIN A1C: Hgb A1c MFr Bld: 8.8 % — ABNORMAL HIGH (ref 4.6–6.5)

## 2019-11-17 ENCOUNTER — Encounter: Payer: Self-pay | Admitting: Endocrinology

## 2019-11-17 ENCOUNTER — Ambulatory Visit: Payer: Medicare (Managed Care) | Admitting: Endocrinology

## 2019-11-17 ENCOUNTER — Ambulatory Visit (INDEPENDENT_AMBULATORY_CARE_PROVIDER_SITE_OTHER): Payer: Medicare (Managed Care) | Admitting: Endocrinology

## 2019-11-17 VITALS — BP 124/70 | HR 99 | Ht 73.0 in | Wt 274.0 lb

## 2019-11-17 DIAGNOSIS — Z794 Long term (current) use of insulin: Secondary | ICD-10-CM

## 2019-11-17 DIAGNOSIS — N289 Disorder of kidney and ureter, unspecified: Secondary | ICD-10-CM | POA: Diagnosis not present

## 2019-11-17 DIAGNOSIS — Z23 Encounter for immunization: Secondary | ICD-10-CM

## 2019-11-17 DIAGNOSIS — E1165 Type 2 diabetes mellitus with hyperglycemia: Secondary | ICD-10-CM | POA: Diagnosis not present

## 2019-11-17 NOTE — Progress Notes (Signed)
Patient ID: [name] [name], male   DOB: Feb 08, 1931, 86 y.o.   MRN: [id number]   Reason for Appointment: diabetes follow-up  History of Present Illness     DIABETES type II:  Diagnosis date: 1990  Previous history: He has been on insulin since 2005 Previously on Lantus and also subsequently basal bolus regimen but this was changed to NPH and regular insulin because of cost his A1c has been usually near 7% in the past He was switched from Lantus to NPH because of cost  Recent history:   INSULIN REGIMEN:    NOVOLIN-N -Relion 15 units before breakfast and 15 units supper Relion/ Regular Insulin 10 units twice daily before breakfast and dinner     Oral hypoglycemic drugs: Metformin ER 500 mg -- 2 in p.m.      Current blood sugar patterns and problems identified:   His A1c is relatively higher at 8.8   He still has high blood sugars at dinnertime despite his saying that he is taking 10 units of regular insulin before eating lunch  Overall average blood sugar is higher because of increased fasting readings at times  Even though he thinks he is cutting back on portions and losing a little weight is blood sugar control is worse than on the last visit  However not checking blood sugars after meals and only before breakfast and dinnertime  He thinks he is trying to take his insulin before starting to eat and is usually regular with this  No hypoglycemia reported with lowest blood sugar 80 in the morning and he is asking about what to do with his insulin when blood sugars are at that level  He is regular with Metformin  No exercise although he thinks he is trying to walk indoors  .          Monitors blood glucose: <1 times a day.    Glucometer:   One Touch Verio        Blood Glucose readings and median from download:   PRE-MEAL Fasting Lunch Dinner Bedtime Overall  Glucose range:  80-326   152-296    Mean/median:  164   219  189   POST-MEAL PC Breakfast PC  Lunch PC Dinner  Glucose range:   ?  Mean/median:      Prior   PRE-MEAL Fasting Lunch Dinner Bedtime Overall  Glucose range:  95-167 ?   139-376   95-376  Mean/median:  140   226   149    Meals: 2-3 meals per day.  breakfast is eggs,oatmeal usually.   Breakfast 7 AM, lunch 1 PM and dinner 6-7                 Dietician visit: Most recent:  02/2017     Weight control:   Wt Readings from Last 3 Encounters:  11/17/19 274 lb (124.3 kg)  09/09/19 277 lb 4.8 oz (125.8 kg)  08/25/19 (!) 280 lb (127 kg)           Diabetes labs:  Lab Results  Component Value Date   HGBA1C 8.8 (H) 11/16/2019   HGBA1C 8.2 (H) 08/09/2019   HGBA1C 7.5 (A) 05/11/2019   Lab Results  Component Value Date   MICROALBUR 3.5 (H) 08/09/2019   LDLCALC 46 08/09/2019   CREATININE 1.53 (H) 11/16/2019     Allergies as of 11/17/2019   No Known Allergies     Medication List       Accurate as of November 17, 2019  3:04 PM. If you have any questions, ask your nurse or doctor.        allopurinol 100 MG tablet Commonly known as: ZYLOPRIM Take 2 tablets by mouth once daily   amLODipine 10 MG tablet Commonly known as: NORVASC Take 1 tablet by mouth once daily   aspirin EC 81 MG tablet Take 1 tablet (81 mg total) by mouth daily.   atorvastatin 40 MG tablet Commonly known as: LIPITOR Take 1 tablet by mouth once daily   cilostazol 50 MG tablet Commonly known as: PLETAL Take 1 tablet (50 mg total) by mouth 2 (two) times daily.   colchicine 0.6 MG tablet Take 1 tablet (0.6 mg total) by mouth 2 (two) times daily as needed.   diclofenac Sodium 1 % Gel Commonly known as: VOLTAREN Apply 2 g topically 4 (four) times daily as needed (back pain).   furosemide 20 MG tablet Commonly known as: LASIX Take 1 tablet (20 mg total) by mouth daily.   gabapentin 100 MG capsule Commonly known as: NEURONTIN Take 1 capsule (100 mg total) by mouth 3 (three) times daily.   insulin NPH Human 100 UNIT/ML  injection Commonly known as: NOVOLIN N Inject 10 Units into the skin 2 (two) times daily.   Insulin Syringe-Needle U-100 31G X 15/64" 0.3 ML Misc Use to inject insulin 3 times a day   loratadine 10 MG tablet Commonly known as: Allergy Relief Take 1 tablet (10 mg total) by mouth daily as needed for rhinitis.   losartan 50 MG tablet Commonly known as: COZAAR Take 1 tablet by mouth once daily   metFORMIN 500 MG 24 hr tablet Commonly known as: GLUCOPHAGE-XR TAKE 2 TABLETS BY MOUTH ONCE DAILY WITH SUPPER   metoprolol tartrate 25 MG tablet Commonly known as: LOPRESSOR Take 1 tablet by mouth twice daily   NOVOLIN R RELION IJ Inject 15 Units as directed 2 (two) times daily before a meal.   OneTouch Delica Lancets Fine Misc Use to check blood sugar 2 times per day dx code E11.49   OneTouch Verio Flex System w/Device Kit Use OneTouch Verio Flex meter to check blood sugar twice daily.   OneTouch Verio test strip Generic drug: glucose blood USE AS DIRECTED TO CHECK BLOOD SUGAR TWICE DAILY   oxybutynin 5 MG tablet Commonly known as: DITROPAN Take 1 tablet (5 mg total) by mouth 3 (three) times daily.   pantoprazole 40 MG tablet Commonly known as: Protonix Take 1 tablet (40 mg total) by mouth daily.   tamsulosin 0.4 MG Caps capsule Commonly known as: FLOMAX Take 1 capsule (0.4 mg total) by mouth daily.       Allergies: No Known Allergies  Past Medical History:  Diagnosis Date  . Benign prostatic hyperplasia (BPH) with urinary urge incontinence 11/13/2017  . Chronic left shoulder pain 11/13/2017  . Chronic non-seasonal allergic rhinitis 02/17/2006  . Chronic systolic heart failure ([place]) 10/31/2016   Echo (05/03/2015): LVEF 35-46%, grade 2 diastolic dysfunction  . Coronary artery disease involving native coronary artery of native heart without angina pectoris 09/30/2007   s/p CABG on 01/05/2013: left internal mammary artery to left anterior descending, saphenous vein graft to  obtuse marginal 1, sequential saphenous vein graft to acute marginal and posterior descending  . Essential hypertension 11/04/2005  . Gastroesophageal reflux disease with hiatal hernia 11/04/2005  . History of prostate cancer 02/17/2006   Diagnosed 1995, s/p seed implantation in the late 1990's, PSA undetectable 04/10/2016  . Hyperlipidemia 11/04/2005  .  Obesity (BMI 30.0-34.9) 11/13/2017  . Peripheral arterial disease ([place]) 09/30/2007   with claudication, failed attempt at LLE PTCA  . Type 2 diabetes mellitus with microalbuminuria, with long-term current use of insulin ([place]) 11/13/2017  . Type 2 diabetes mellitus with mild 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, bilateral ([place]) 03/24/2018  . Type 2 diabetes mellitus with peripheral neuropathy ([place]) 11/04/2005  . Urethral stricture in male 11/13/2017   Noted on cystoscopy 12/18/2014.  Reportedly asked to do chronic intermittent self catheterizations, but has not.    Past Surgical History:  Procedure Laterality Date  . CARDIAC CATHETERIZATION  10/05/08   REVEALS HYPOKINESIS OF THE LATERAL WALL AND EF 50-55%  . CORONARY ANGIOPLASTY WITH STENT PLACEMENT    . CORONARY ARTERY BYPASS GRAFT N/A 01/05/2013   Procedure: CORONARY ARTERY BYPASS GRAFTING (CABG);  Surgeon: Melrose Nakayama, MD;  Location: [place];  Service: Open Heart Surgery;  Laterality: N/A;  . INTRAOPERATIVE TRANSESOPHAGEAL ECHOCARDIOGRAM N/A 01/05/2013   Procedure: INTRAOPERATIVE TRANSESOPHAGEAL ECHOCARDIOGRAM;  Surgeon: Melrose Nakayama, MD;  Location: [place];  Service: Open Heart Surgery;  Laterality: N/A;  . LEFT HEART CATHETERIZATION WITH CORONARY ANGIOGRAM N/A 01/03/2013   Procedure: LEFT HEART CATHETERIZATION WITH CORONARY ANGIOGRAM;  Surgeon: Lorretta Harp, MD;  Location: [facility] C[facility] LAB;  Service: Cardiovascular;  Laterality: N/A;  . LOWER EXTREMITY ANGIOGRAM Right 12/07/2012   unsuccessful attempt at percutaneous revascularization of a calcified long segment  chronic total occlusion mid left SFA /notes 12/07/2012  . LOWER EXTREMITY ANGIOGRAM N/A 12/07/2012   Procedure: LOWER EXTREMITY ANGIOGRAM;  Surgeon: Lorretta Harp, MD;  Location: [facility] CATH LAB;  Service: Cardiovascular;  Laterality: N/A;  . PROSTATE SURGERY  1990's    Family History  Problem Relation Age of Onset  . Kidney failure Mother   . Hypertension Father   . Stroke Father   . Other Sister   . Other Brother   . Other Brother   . Other Brother   . Other Brother   . Other Brother   . Other Brother   . Other Brother   . Other Brother   . Healthy Daughter   . Healthy Daughter   . Healthy Daughter   . Healthy Son     Social History:  reports that he quit smoking about 39 years ago. His smoking use included cigarettes. He has a 15.00 pack-year smoking history. He has never used smokeless tobacco. He reports that he does not drink alcohol and does not use drugs.  Review of Systems:   Hypertension:  has had long-standing hypertension followed by his PCP and other physicians Blood pressure recently well controlled  BP Readings from Last 3 Encounters:  11/17/19 124/70  09/09/19 117/72  08/12/19 132/76     Lipids: Has been treated with Lipitor 40 mg Usually has lipid levels well below target   Lab Results  Component Value Date   CHOL 103 08/09/2019   HDL 34.50 (L) 08/09/2019   LDLCALC 46 08/09/2019   TRIG 111.0 08/09/2019   CHOLHDL 3 08/09/2019    Neuropathy: Has been treated with gabapentin 100 mg by his PCP  Claudication: Has pain in the lower legs when he is walking, more on the left calf, followed by cardiologist     Foot exam done in 02/2019, has absent pedal pulses otherwise normal monofilament sensation   RENAL dysfunction: This is mild and stable  He does not have microalbuminuria  Lab Results  Component Value Date   CREATININE 1.53 (H) 11/16/2019  CREATININE 1.35 08/09/2019   CREATININE 1.43 05/11/2019       LABS:  Lab on 11/16/2019   Component Date Value Ref Range Status  . Sodium 11/16/2019 140  135 - 145 mEq/L Final  . Potassium 11/16/2019 3.7  3.5 - 5.1 mEq/L Final  . Chloride 11/16/2019 105  96 - 112 mEq/L Final  . CO2 11/16/2019 28  19 - 32 mEq/L Final  . Glucose, Bld 11/16/2019 141* 70 - 99 mg/dL Final  . BUN 11/16/2019 24* 6 - 23 mg/dL Final  . Creatinine, Ser 11/16/2019 1.53* 0.40 - 1.50 mg/dL Final  . GFR 11/16/2019 40.54* >60.00 mL/min Final  . Calcium 11/16/2019 9.0  8.4 - 10.5 mg/dL Final  . Hgb A1c MFr Bld 11/16/2019 8.8* 4.6 - 6.5 % Final   Glycemic Control Guidelines for People with Diabetes:Non Diabetic:  <6%Goal of Therapy: <7%Additional Action Suggested:  >8%      Examination:   BP 124/70 (BP Location: Left Arm, Patient Position: Sitting, Cuff Size: Normal)   Pulse 99   Ht $R'6\' 1"'CS$  (1.854 m)   Wt 274 lb (124.3 kg)   SpO2 96%   BMI 36.15 kg/m   Body mass index is 36.15 kg/m.     ASSESSMENT/ PLAN:     Diabetes type 2 insulin-dependent:   His A1c is higher at 8.8   See history of present illness for detailed discussion of current diabetes management, blood sugar patterns and problems identified  He is on NPH and regular insulin as above with Metformin   Blood sugars are averaging over 200 at dinnertime even though he thinks he is taking with lunchtime coverage with 10 units regular Not clear why fasting readings available, may depend on the sugar the night before   He will take 12 units of regular insulin at lunchtime instead of 10, however this may need to be adjusted further if needed start checking blood sugars at lunchtime as discussed today  Detailed instructions on insulin changes and monitoring given  He will also need to increase his morning NPH to 20 units to help afternoon blood sugar  He will take his morning insulin regardless of whether his blood sugars are normal or not and if his blood sugars are low normal he can drink some juice to bring it up  Make sure he check  some blood sugars after supper  Most likely can alternate blood sugars at different times either before meals or bedtime  Follow-up in 2 months to make sure his blood sugars are improving  Continue same dose of Metformin since creatinine clearance is above 40  HYPERTENSION: Blood pressure is variably controlled, to be followed by PCP and cardiologist  Renal function is slightly worse without decrease in blood pressure or use of diuretics, needs follow-up with PCP      Patient Instructions  INSULIN doses to be changed as follows  MORNING insulin before eating breakfast: Take 20 units of the cloudy insulin along with 10 units of the clear insulin  Before LUNCH at noon take 12 units of the clear insulin instead of 10  Before suppertime in the evening take 15 units of the CLOUDY and 10 units of regular insulin  You can check your sugar on an average twice a day as follows  BEFORE breakfast: Every other day Before LUNCH: Every other day Before DINNER every other day Before bedtime: Every other day  Call if having any low blood sugar problems  Please have eye doctor send [place]  a copy of the report        Elayne Snare 11/17/2019, 3:04 PM

## 2019-11-17 NOTE — Patient Instructions (Addendum)
INSULIN doses to be changed as follows  MORNING insulin before eating breakfast: Take 20 units of the cloudy insulin along with 10 units of the clear insulin  Before LUNCH at noon take 12 units of the clear insulin instead of 10  Before suppertime in the evening take 15 units of the CLOUDY and 10 units of regular insulin  You can check your sugar on an average twice a day as follows  BEFORE breakfast: Every other day Before LUNCH: Every other day Before DINNER every other day Before bedtime: Every other day  Call if having any low blood sugar problems  Please have eye doctor send [place] a copy of the report

## 2019-11-23 ENCOUNTER — Other Ambulatory Visit: Payer: Self-pay | Admitting: Internal Medicine

## 2019-11-23 DIAGNOSIS — J3089 Other allergic rhinitis: Secondary | ICD-10-CM

## 2019-11-28 ENCOUNTER — Other Ambulatory Visit: Payer: Medicare (Managed Care)

## 2019-11-28 ENCOUNTER — Ambulatory Visit (INDEPENDENT_AMBULATORY_CARE_PROVIDER_SITE_OTHER): Payer: Medicare (Managed Care) | Admitting: Cardiovascular Disease

## 2019-11-28 ENCOUNTER — Other Ambulatory Visit: Payer: Self-pay

## 2019-11-28 ENCOUNTER — Encounter: Payer: Self-pay | Admitting: Cardiovascular Disease

## 2019-11-28 VITALS — BP 114/68 | HR 62 | Ht 73.0 in | Wt 276.4 lb

## 2019-11-28 DIAGNOSIS — I2510 Atherosclerotic heart disease of native coronary artery without angina pectoris: Secondary | ICD-10-CM

## 2019-11-28 DIAGNOSIS — I10 Essential (primary) hypertension: Secondary | ICD-10-CM | POA: Diagnosis not present

## 2019-11-28 DIAGNOSIS — I5022 Chronic systolic (congestive) heart failure: Secondary | ICD-10-CM | POA: Diagnosis not present

## 2019-11-28 NOTE — Progress Notes (Signed)
[name] [name] Date of Birth  1931/08/12 [facility] / [facility] [name] [name] [address]     [place], [place]  [address] [phone]  Fax  [phone]   Problem List 1. CAD - status post stenting and   status post coronary artery bypass grafting 2. Peripheral vascular disease 3. Diabetes mellitus 4. Hypertension 5. Hyperlipidemia    [name] [name] is a 86 y.o. gentleman with a Hx of CAD - s/p stenting, diabetes mellitus, hypertension and hyperlipidemia. He presents today with the complaint of midsternal chest pain. This typically occurs after he eats some food and then goes out and doesn't work. It resolves after about 30 minutes.  He did not take any nitroglycerin. He took some Prilosec and it eventually resolved.   Jun 29, 2013:  [name] [name] has had CABG since I last saw him.    His CABG was complicated by several things - hematura and now he has urinary incontinence and wears depends.  Nov. 24, 2015:  [name] [name] is s/p CABG. He has CAD, DM and now has urninary incontinence.   Has seen Dr. Jeffie Pollock who does not think there are any options for his incontence.   September 18, 2014:  Doing well.  Has some indigestion .   Has seen Dr. Gwenlyn Found for peripheral vascular disease.  He high-grade segmental right and total left SFA stenosis with single-vessel runoff below the knees bilaterally.   Dr. Gwenlyn Found attempted unsuccessfully to percutaneously revascularize his left SFA.  Feb. 13, 2017:  No CP , No dyspnea  Dr. Gwenlyn Found was not able to revascularized his left SFA.    Has leg pain with walking - right > left ,  Aug. 16, 2017:  [name] [name] is seen today for follow-up visit. He has a history of coronary artery disease and peripheral vascular disease. Has seen Dr. Gwenlyn Found - attempted SFA revascularization but it was unsuccessful.   No cardiac complaints. Has significant claudication with any walking .  Has gotten some relief with gabapentin - causes a dry  mouth  Feb. 12, 2018:  Still eating some salty foods.  Some hot dogs, canned food, sausage  His blood pressure has been a little elevated and he has been having headaches on a regular basis.  Sept. 28, 2018:  [name] [name]  is feeling better. He's cut out a lot of his salty foods. Blood pressure is well-controlled. He thinks that one of his meds is causing a dry mouth may be causing him to have a dry mouth.   May 19, 2017  [place] is doing well .  No CP or dyspnea.    Some pain in right leg with walking - if he goes to far.  Able to do most of his usual activities without too much trouble   November 25, 2017:  [name] [name] is seen today for follow-up visit.  He has a history of coronary artery disease and peripheral vascular disease. He has a history of hypertension and hyperlipidemia. No CP or dyspnea.   Has left shoulder pain when the weather is cold .  Has some claudicatin when he walks.  May 27, 2019: [name] [name] is seen today for follow-up of his coronary artery disease and peripheral vascular disease.  He also has hypertension and hyperlipidemia.  No CP ,  Occasional leg pain  Has indigestion .   Takes pantoprozole  With relief.  Occurs after he eats. , no with exertion   November 28, 2019: [name] [name] is seen today for follow-up of his coronary artery  disease, peripheral vascular disease, hypertension, hyperlipidemia. Wt. Is 276 lbs , sitting around the house too much now with covid.  Has occasional stomach tightness. And gas pains No cp   Current Outpatient Medications on File Prior to Visit  Medication Sig Dispense Refill   allopurinol (ZYLOPRIM) 100 MG tablet Take 2 tablets by mouth once daily 180 tablet 3   amLODipine (NORVASC) 10 MG tablet Take 1 tablet by mouth once daily 90 tablet 3   aspirin EC 81 MG tablet Take 1 tablet (81 mg total) by mouth daily.     atorvastatin (LIPITOR) 40 MG tablet Take 1 tablet by mouth once daily 90 tablet 3   Blood Glucose  Monitoring Suppl (ONETOUCH VERIO FLEX SYSTEM) w/Device KIT Use OneTouch Verio Flex meter to check blood sugar twice daily. 1 kit 0   cilostazol (PLETAL) 50 MG tablet Take 1 tablet (50 mg total) by mouth 2 (two) times daily. 180 tablet 3   colchicine 0.6 MG tablet Take 1 tablet (0.6 mg total) by mouth 2 (two) times daily as needed. 60 tablet 3   diclofenac Sodium (VOLTAREN) 1 % GEL Apply 2 g topically 4 (four) times daily as needed (back pain). 150 g 3   EQ LORATADINE 10 MG tablet TAKE 1 TABLET BY MOUTH DAILY AS NEEDED FOR RHINITIS. 90 tablet 3   furosemide (LASIX) 20 MG tablet Take 1 tablet (20 mg total) by mouth daily. 90 tablet 1   gabapentin (NEURONTIN) 100 MG capsule Take 1 capsule (100 mg total) by mouth 3 (three) times daily. 90 capsule 5   insulin NPH Human (HUMULIN N,NOVOLIN N) 100 UNIT/ML injection Inject 10 Units into the skin 2 (two) times daily.      Insulin Regular Human (NOVOLIN R RELION IJ) Inject 15 Units as directed 2 (two) times daily before a meal.     Insulin Syringe-Needle U-100 31G X 15/64" 0.3 ML MISC Use to inject insulin 3 times a day 300 each 3   losartan (COZAAR) 50 MG tablet Take 1 tablet by mouth once daily 90 tablet 0   metFORMIN (GLUCOPHAGE-XR) 500 MG 24 hr tablet TAKE 2 TABLETS BY MOUTH ONCE DAILY WITH SUPPER 180 tablet 0   metoprolol tartrate (LOPRESSOR) 25 MG tablet Take 1 tablet by mouth twice daily 180 tablet 3   ONETOUCH DELICA LANCETS FINE MISC Use to check blood sugar 2 times per day dx code E11.49 100 each 3   ONETOUCH VERIO test strip USE AS DIRECTED TO CHECK BLOOD SUGAR TWICE DAILY 100 each 4   oxybutynin (DITROPAN) 5 MG tablet Take 1 tablet (5 mg total) by mouth 3 (three) times daily. 270 tablet 1   pantoprazole (PROTONIX) 40 MG tablet Take 1 tablet (40 mg total) by mouth daily. 90 tablet 3   tamsulosin (FLOMAX) 0.4 MG CAPS capsule Take 1 capsule (0.4 mg total) by mouth daily. 90 capsule 3   No current facility-administered medications on  file prior to visit.    No Known Allergies  Past Medical History:  Diagnosis Date   Benign prostatic hyperplasia (BPH) with urinary urge incontinence 11/13/2017   Chronic left shoulder pain 11/13/2017   Chronic non-seasonal allergic rhinitis 3/87/5643   Chronic systolic heart failure ([place]) 10/31/2016   Echo (05/03/2015): LVEF 32-95%, grade 2 diastolic dysfunction   Coronary artery disease involving native coronary artery of native heart without angina pectoris 09/30/2007   s/p CABG on 01/05/2013: left internal mammary artery to left anterior descending, saphenous vein graft to obtuse marginal  1, sequential saphenous vein graft to acute marginal and posterior descending   Essential hypertension 11/04/2005   Gastroesophageal reflux disease with hiatal hernia 11/04/2005   History of prostate cancer 02/17/2006   Diagnosed 1995, s/p seed implantation in the late 1990's, PSA undetectable 04/10/2016   Hyperlipidemia 11/04/2005   Obesity (BMI 30.0-34.9) 11/13/2017   Peripheral arterial disease ([place]) 09/30/2007   with claudication, failed attempt at LLE PTCA   Type 2 diabetes mellitus with microalbuminuria, with long-term current use of insulin ([place]) 11/13/2017   Type 2 diabetes mellitus with mild 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, bilateral ([place]) 03/24/2018   Type 2 diabetes mellitus with peripheral neuropathy ([name] [name]) 11/04/2005   Urethral stricture in male 11/13/2017   Noted on cystoscopy 12/18/2014.  Reportedly asked to do chronic intermittent self catheterizations, but has not.    Past Surgical History:  Procedure Laterality Date   CARDIAC CATHETERIZATION  10/05/08   REVEALS HYPOKINESIS OF THE LATERAL WALL AND EF 50-55%   CORONARY ANGIOPLASTY WITH STENT PLACEMENT     CORONARY ARTERY BYPASS GRAFT N/A 01/05/2013   Procedure: CORONARY ARTERY BYPASS GRAFTING (CABG);  Surgeon: Melrose Nakayama, MD;  Location: [facility];  Service: Open Heart Surgery;  Laterality:  N/A;   INTRAOPERATIVE TRANSESOPHAGEAL ECHOCARDIOGRAM N/A 01/05/2013   Procedure: INTRAOPERATIVE TRANSESOPHAGEAL ECHOCARDIOGRAM;  Surgeon: Melrose Nakayama, MD;  Location: [facility];  Service: Open Heart Surgery;  Laterality: N/A;   LEFT HEART CATHETERIZATION WITH CORONARY ANGIOGRAM N/A 01/03/2013   Procedure: LEFT HEART CATHETERIZATION WITH CORONARY ANGIOGRAM;  Surgeon: Lorretta Harp, MD;  Location: [facility] C[facility] LAB;  Service: Cardiovascular;  Laterality: N/A;   LOWER EXTREMITY ANGIOGRAM Right 12/07/2012   unsuccessful attempt at percutaneous revascularization of a calcified long segment chronic total occlusion mid left SFA /notes 12/07/2012   LOWER EXTREMITY ANGIOGRAM N/A 12/07/2012   Procedure: LOWER EXTREMITY ANGIOGRAM;  Surgeon: Lorretta Harp, MD;  Location: [facility] C[facility] LAB;  Service: Cardiovascular;  Laterality: N/A;   PROSTATE SURGERY  1990's    Social History   Tobacco Use  Smoking Status Former Smoker   Packs/day: 0.75   Years: 20.00   Pack years: 15.00   Types: Cigarettes   Quit date: 07/08/1980   Years since quitting: 39.4  Smokeless Tobacco Never Used    Social History   Substance and Sexual Activity  Alcohol Use No   Alcohol/week: 0.0 standard drinks   Comment: 12/07/2012 "quit drinking alcohol > 25 yr ago"    Family History  Problem Relation Age of Onset   Kidney failure Mother    Hypertension Father    Stroke Father    Other Sister    Other Brother    Other Brother    Other Brother    Other Brother    Other Brother    Other Brother    Other Brother    Other Brother    Healthy Daughter    Healthy Daughter    Healthy Daughter    Healthy Son     Reviw of Systems:  Reviewed in the HPI.  All other systems are negative.   Physical Exam: Blood pressure 114/68, pulse 62, height _0  (1.854 m), weight 276 lb 6.4 oz (125.4 kg), SpO2 98 %.  GEN:  Elderly , moderately obese male,  HEENT: Normal NECK: No JVD; No carotid  bruits LYMPHATICS: No lymphadenopathy CARDIAC:  Irreg. Irreg.  RESPIRATORY:  Clear to auscultation without rales, wheezing or rhonchi  ABDOMEN: Soft, non-tender, non-distended MUSCULOSKELETAL:  No edema; No  deformity  SKIN: Warm and dry NEUROLOGIC:  Alert and oriented x 3  ECG: November 28, 2019: Normal sinus rhythm with frequent premature atrial contractions.  Heart rate is 67.  Assessment / Plan:   1. CAD -        No angina .   [name] 2. Peripheral vascular disease -       3. Diabetes mellitus -    4. Hypertension -    BP is well controlled   5. Hyperlipidemia  -      lipids several months ago are well controlle.   6. Chronic systolic congestive heart failure -  Seems to be stable     Mertie Moores, MD  11/28/2019 9:36 AM    [address],  [place], [place]  [address] Pager [phone] Phone: [phone]; Fax: [phone]

## 2019-11-28 NOTE — Patient Instructions (Signed)
Medication Instructions:  NO CHANGES  *If you need a refill on your cardiac medications before your next appointment, please call your pharmacy*   Lab Work: NONE If you have labs (blood work) drawn today and your tests are completely normal, you will receive your results only by: [name] MyChart Message (if you have MyChart) OR . A paper copy in the mail If you have any lab test that is abnormal or we need to change your treatment, we will call you to review the results.   Testing/Procedures: NONE   Follow-Up: At [facility], you and your health needs are our priority.  As part of our continuing mission to provide you with exceptional heart care, we have created designated Provider Care Teams.  These Care Teams include your primary Cardiologist (physician) and Advanced Practice Providers (APPs -  Physician Assistants and Nurse Practitioners) who all work together to provide you with the care you need, when you need it.  We recommend signing up for the patient portal called "MyChart".  Sign up information is provided on this After Visit Summary.  MyChart is used to connect with patients for Virtual Visits (Telemedicine).  Patients are able to view lab/test results, encounter notes, upcoming appointments, etc.  Non-urgent messages can be sent to your provider as well.   To learn more about what you can do with MyChart, go to [email or web address].    Your next appointment:   1 year(s)  The format for your next appointment:   In Person  Provider:   You will see one of the following Advanced Practice Providers on your designated Care Team:    Richardson Dopp, PA-C  [place], [place]

## 2019-12-07 ENCOUNTER — Other Ambulatory Visit: Payer: Self-pay | Admitting: Endocrinology

## 2019-12-08 ENCOUNTER — Ambulatory Visit: Payer: Medicare (Managed Care) | Admitting: *Deleted

## 2019-12-08 DIAGNOSIS — I2510 Atherosclerotic heart disease of native coronary artery without angina pectoris: Secondary | ICD-10-CM

## 2019-12-08 DIAGNOSIS — I10 Essential (primary) hypertension: Secondary | ICD-10-CM

## 2019-12-08 DIAGNOSIS — E1142 Type 2 diabetes mellitus with diabetic polyneuropathy: Secondary | ICD-10-CM

## 2019-12-08 DIAGNOSIS — N289 Disorder of kidney and ureter, unspecified: Secondary | ICD-10-CM

## 2019-12-08 DIAGNOSIS — E1165 Type 2 diabetes mellitus with hyperglycemia: Secondary | ICD-10-CM

## 2019-12-08 DIAGNOSIS — I5022 Chronic systolic (congestive) heart failure: Secondary | ICD-10-CM

## 2019-12-08 NOTE — Chronic Care Management (AMB) (Signed)
Chronic Care Management   Follow Up Note   12/08/2019 Name: [name] [name] MRN: [id number] DOB: May 24, 1931  Referred by: [name], MD Reason for referral : Chronic Care Management (IDDM, HTN, CAD, HF, HLD, PVD, Gout)   [name] [name] is a 86 y.o. year old male who is a primary care patient of [name], MD. The CCM team was consulted for assistance with chronic disease management and care coordination needs.    Review of patient status, including review of consultants reports, relevant laboratory and other test results, and collaboration with appropriate care team members and the patient's provider was performed as part of comprehensive patient evaluation and provision of chronic care management services.    SDOH (Social Determinants of Health) assessments performed: No See Care Plan activities for detailed interventions related to [facility])     Outpatient Encounter Medications as of 12/08/2019  Medication Sig  . insulin NPH Human (NOVOLIN N) 100 UNIT/ML injection Inject 15 Units into the skin daily before supper. Per Dr [name] note of 11/17/19  . insulin regular (NOVOLIN R) 100 units/mL injection Inject 12 Units into the skin daily before lunch. Per Dr [name] OV note of 11/17/19  . allopurinol (ZYLOPRIM) 100 MG tablet Take 2 tablets by mouth once daily  . amLODipine (NORVASC) 10 MG tablet Take 1 tablet by mouth once daily  . aspirin EC 81 MG tablet Take 1 tablet (81 mg total) by mouth daily.  [name] atorvastatin (LIPITOR) 40 MG tablet Take 1 tablet by mouth once daily  . Blood Glucose Monitoring Suppl (ONETOUCH VERIO FLEX SYSTEM) w/Device KIT Use OneTouch Verio Flex meter to check blood sugar twice daily.  . cilostazol (PLETAL) 50 MG tablet Take 1 tablet (50 mg total) by mouth 2 (two) times daily.  . colchicine 0.6 MG tablet Take 1 tablet (0.6 mg total) by mouth 2 (two) times daily as needed.  . diclofenac Sodium (VOLTAREN) 1 % GEL Apply 2 g topically 4 (four) times daily  as needed (back pain).  . EQ LORATADINE 10 MG tablet TAKE 1 TABLET BY MOUTH DAILY AS NEEDED FOR RHINITIS.  . furosemide (LASIX) 20 MG tablet Take 1 tablet (20 mg total) by mouth daily.  [name] gabapentin (NEURONTIN) 100 MG capsule Take 1 capsule (100 mg total) by mouth 3 (three) times daily.  . insulin NPH Human (HUMULIN N,NOVOLIN N) 100 UNIT/ML injection Inject 20 Units into the skin daily before breakfast. Per Dr [name] OV note of 11/17/19  . Insulin Regular Human (NOVOLIN R RELION IJ) Inject 10 Units as directed 2 (two) times daily with a meal. Before  breakfast and supper Per Dr [name] OV of 11/17/19  . Insulin Syringe-Needle U-100 31G X 15/64" 0.3 ML MISC Use to inject insulin 3 times a day  . losartan (COZAAR) 50 MG tablet Take 1 tablet by mouth once daily  . metFORMIN (GLUCOPHAGE-XR) 500 MG 24 hr tablet TAKE 2 TABLETS BY MOUTH ONCE DAILY WITH SUPPER  . metoprolol tartrate (LOPRESSOR) 25 MG tablet Take 1 tablet by mouth twice daily  . ONETOUCH DELICA LANCETS FINE MISC Use to check blood sugar 2 times per day dx code E11.49  . ONETOUCH VERIO test strip USE AS DIRECTED TO CHECK BLOOD SUGAR TWICE DAILY  . oxybutynin (DITROPAN) 5 MG tablet Take 1 tablet (5 mg total) by mouth 3 (three) times daily.  . pantoprazole (PROTONIX) 40 MG tablet Take 1 tablet (40 mg total) by mouth daily.  . tamsulosin (FLOMAX) 0.4 MG CAPS capsule Take  1 capsule (0.4 mg total) by mouth daily.   No facility-administered encounter medications on file as of 12/08/2019.     Objective:  Lab Results  Component Value Date   HGBA1C 8.8 (H) 11/16/2019   HGBA1C 8.2 (H) 08/09/2019   HGBA1C 7.5 (A) 05/11/2019   Lab Results  Component Value Date   MICROALBUR 3.5 (H) 08/09/2019   LDLCALC 46 08/09/2019   CREATININE 1.53 (H) 11/16/2019   Wt Readings from Last 3 Encounters:  11/28/19 276 lb 6.4 oz (125.4 kg)  11/17/19 274 lb (124.3 kg)  09/09/19 277 lb 4.8 oz (125.8 kg)   Lab Results  Component Value Date   CHOL 103  08/09/2019   HDL 34.50 (L) 08/09/2019   LDLCALC 46 08/09/2019   TRIG 111.0 08/09/2019   CHOLHDL 3 08/09/2019    Goals Addressed              This Visit's Progress     Patient Stated   .  " I need you to help me find an eye doctor that will take my health insurance." (pt-stated)        CARE PLAN ENTRY (see longitudinal plan of care for additional care plan information)  Current Barriers:  . Care Coordination needs related to securing eye exam appointment  in a patient with IDDM, HTN, CAD, HF, HLD- spoke with patient about in network ophthalmologist - he has an appointment with Dr Katy Fitch on 01/03/20  Nurse Case Manager Clinical Goal(s):  [name] Over the next 7-10 days, patient will work with [facility] RN  to address needs related to securing eye exam appointment with in network provider.  Interventions:  . Inter-disciplinary care team collaboration (see longitudinal plan of care) . Using [email or web address]  web site searched for in network ophthalmologist with in 10 mile radius of patient's zip code and there were 13 providers . Spoke with patient via phone and with his agreement , will mail the list of in network ophthalmologist to his home address . 12/08/19 Verified patient was able to secure an appointment with an in network ophthalmologist  Patient Self Care Activities:  . Patient verbalizes understanding of plan to work with CCM RN to arrange eye exam . Self administers medications as prescribed . Attends all scheduled provider appointments . Calls pharmacy for medication refills . Performs ADL's independently . Performs IADL's independently . Calls provider office for new concerns or questions . Patient will complete eye exam once appointment has been made with in network provider . Unable to independently make eye exam appointment with in network provider  Please see past updates related to this goal by clicking on the "Past Updates" button in the selected goal      .  "I  use a pill box and I take my medications like I'm supposed to." (pt-stated)        [place] (see longitudinal plan of care for additional care plan information)  Current Barriers:  . Chronic Disease Management support, education, and care coordination needs related to CHF, CAD, HTN, HLD, and DMII-successful outreach to patient to complete follow up assessment, he asks that this CCM RN write down the insulin changes Dr Dwyane Dee made when he saw him on 11/17/19 on a separate piece of paper in large text and give to him when he is seen in the clinic on 12/09/19  Clinical Goal(s) related to CHF, CAD, HTN, HLD, and DMII:  Over the next 30 -60 days, patient will:  . Work with  the care management team to address educational, disease management, and care coordination needs  . Begin or continue self health monitoring activities as directed today  take medications as directed . Call provider office for new or worsened signs and symptoms Blood glucose findings outside established parameters and New or worsened symptom related to CHF, CAD, HTN, HLD, and DMII . Call care management team with questions or concerns . Verbalize basic understanding of patient centered plan of care established today  Interventions related to CHF, CAD, HTN, HLD, and DMII:  . Evaluation of current treatment plans and patient's adherence to plan as established by provider . Assessed patient understanding of disease states . Assessed patient's education and care coordination needs . Provided disease specific education to patient - reviewed notes from patient's visit with Dr Dwyane Dee on 11/17/19 and wrote insulin changes and blood sugar checks and recommendation on a separate piece of paper and securely emailed it to [name] Director and to Dr Daryll Drown to give to patient on 12/09/19 when he is seen in the clinic  Patient Self Care Activities related to CHF, CAD, HTN, HLD, and DMII:  . Patient is  unable to independently self-manage chronic health conditions  Please see past updates related to this goal by clicking on the "Past Updates" button in the selected goal          Plan:   The care management team will reach out to the patient again over the next 30-60 days.    Kelli Churn RN, CCM, [facility] RN Care Manager [phone]

## 2019-12-08 NOTE — Patient Instructions (Signed)
Visit Information It was nice speaking with you today. I will write  the changes to your insulin per Dr [name] instructions of 10/14 and have the clinic staff give you the piece of paper on 12/09/19 during your clinic visit.  Goals Addressed              This Visit's Progress     Patient Stated   .  " I need you to help me find an eye doctor that will take my health insurance." (pt-stated)        CARE PLAN ENTRY (see longitudinal plan of care for additional care plan information)  Current Barriers:  . Care Coordination needs related to securing eye exam appointment  in a patient with IDDM, HTN, CAD, HF, HLD- spoke with patient about in network ophthalmologist - he has an appointment with Dr Katy Fitch on 01/03/20  Nurse Case Manager Clinical Goal(s):  [name] Over the next 7-10 days, patient will work with [facility] RN  to address needs related to securing eye exam appointment with in network provider.  Interventions:  . Inter-disciplinary care team collaboration (see longitudinal plan of care) . Using [email or web address]  web site searched for in network ophthalmologist with in 10 mile radius of patient's zip code and there were 13 providers . Spoke with patient via phone and with his agreement , will mail the list of in network ophthalmologist to his home address . 12/09/19 Verified patient was able to secure an appointment with an in network ophthalmologist  Patient Self Care Activities:  . Patient verbalizes understanding of plan to work with CCM RN to arrange eye exam . Self administers medications as prescribed . Attends all scheduled provider appointments . Calls pharmacy for medication refills . Performs ADL's independently . Performs IADL's independently . Calls provider office for new concerns or questions . Patient will complete eye exam once appointment has been made with in network provider . Unable to independently make eye exam appointment with in network provider  Please  see past updates related to this goal by clicking on the "Past Updates" button in the selected goal      .  "I use a pill box and I take my medications like I'm supposed to." (pt-stated)        [place] (see longitudinal plan of care for additional care plan information)  Current Barriers:  . Chronic Disease Management support, education, and care coordination needs related to CHF, CAD, HTN, HLD, and DMII-successful outreach to patient to complete follow up assessment, he asks that this CCM RN write down the insulin changes Dr Dwyane Dee made when he saw him on 11/17/19 on a separate piece of paper in large text and give to him when he is seen in the clinic on 12/09/19  Clinical Goal(s) related to CHF, CAD, HTN, HLD, and DMII:  Over the next 30 -60 days, patient will:  . Work with the care management team to address educational, disease management, and care coordination needs  . Begin or continue self health monitoring activities as directed today  take medications as directed . Call provider office for new or worsened signs and symptoms Blood glucose findings outside established parameters and New or worsened symptom related to CHF, CAD, HTN, HLD, and DMII . Call care management team with questions or concerns . Verbalize basic understanding of patient centered plan of care established today  Interventions related to CHF, CAD, HTN, HLD, and DMII:  . Evaluation of current treatment plans and patient's  adherence to plan as established by provider . Assessed patient understanding of disease states . Assessed patient's education and care coordination needs . Provided disease specific education to patient - reviewed notes from patient's visit with Dr Dwyane Dee on 11/17/19 and wrote insulin changes and blood sugar checks and recommendation on a separate piece of paper and securely emailed it to [place] Director and to Dr Daryll Drown to give to patient on 12/09/19 when he  is seen in the clinic  Patient Self Care Activities related to CHF, CAD, HTN, HLD, and DMII:  . Patient is unable to independently self-manage chronic health conditions  Please see past updates related to this goal by clicking on the "Past Updates" button in the selected goal         The patient verbalized understanding of instructions provided today and declined a print copy of patient instruction materials.   The care management team will reach out to the patient again over the next 30-60 days.   Kelli Churn RN, CCM, [facility] RN Care Manager [phone]

## 2019-12-09 ENCOUNTER — Other Ambulatory Visit: Payer: Self-pay

## 2019-12-09 ENCOUNTER — Ambulatory Visit (INDEPENDENT_AMBULATORY_CARE_PROVIDER_SITE_OTHER): Payer: Medicare (Managed Care) | Admitting: Internal Medicine

## 2019-12-09 ENCOUNTER — Other Ambulatory Visit: Payer: Self-pay | Admitting: Internal Medicine

## 2019-12-09 ENCOUNTER — Other Ambulatory Visit: Payer: Self-pay | Admitting: Endocrinology

## 2019-12-09 ENCOUNTER — Encounter: Payer: Self-pay | Admitting: Internal Medicine

## 2019-12-09 VITALS — BP 149/61 | HR 64 | Temp 97.8°F | Ht 73.0 in | Wt 274.8 lb

## 2019-12-09 DIAGNOSIS — M109 Gout, unspecified: Secondary | ICD-10-CM

## 2019-12-09 DIAGNOSIS — N3941 Urge incontinence: Secondary | ICD-10-CM

## 2019-12-09 DIAGNOSIS — N35919 Unspecified urethral stricture, male, unspecified site: Secondary | ICD-10-CM

## 2019-12-09 DIAGNOSIS — M25512 Pain in left shoulder: Secondary | ICD-10-CM

## 2019-12-09 DIAGNOSIS — N401 Enlarged prostate with lower urinary tract symptoms: Secondary | ICD-10-CM

## 2019-12-09 DIAGNOSIS — G8929 Other chronic pain: Secondary | ICD-10-CM

## 2019-12-09 DIAGNOSIS — M1A9XX0 Chronic gout, unspecified, without tophus (tophi): Secondary | ICD-10-CM

## 2019-12-09 DIAGNOSIS — Z0000 Encounter for general adult medical examination without abnormal findings: Secondary | ICD-10-CM

## 2019-12-09 DIAGNOSIS — I10 Essential (primary) hypertension: Secondary | ICD-10-CM

## 2019-12-09 MED ORDER — COLCHICINE 0.6 MG PO TABS: 0.6000 mg | ORAL_TABLET | Freq: Two times a day (BID) | ORAL | 3 refills | Status: DC | PRN

## 2019-12-09 MED ORDER — OXYBUTYNIN CHLORIDE 5 MG PO TABS: 5.0000 mg | ORAL_TABLET | Freq: Three times a day (TID) | ORAL | 3 refills | Status: DC

## 2019-12-09 NOTE — Assessment & Plan Note (Signed)
BP today mildly elevated.  Has been well controlled in the past on current regimen.  Will continue current regimen and check renal function today.    Plan Continue amlodipine, losartan, metoprolol

## 2019-12-09 NOTE — Assessment & Plan Note (Signed)
He is currently up to date.  He quit smoking 40+ years ago.

## 2019-12-09 NOTE — Assessment & Plan Note (Signed)
He notes no current problems and no pain today.

## 2019-12-09 NOTE — Patient Instructions (Signed)
Ms. Hoogendoorn,   It was a pleasure to see you today!  For your urinary leakage, try the following  Stop drinking any fluids at 7pm at night.  Go to the bathroom and empty your bladder before bed.   Let me know if this process helps you!  Continue taking your tamsulosin and oxybutynin which should help as well.   Attached to this note is your instructions for insulin which Dr. Dwyane Dee wanted you to follow.   Thank you!  Come back to see me in 3 months.

## 2019-12-09 NOTE — Progress Notes (Signed)
   Subjective:    Patient ID: [name] [name], male    DOB: 07/27/1931, 86 y.o.   MRN: [id number]  3 month follow up for HTN and HLD  HPI  [name] is an 86 year old man with PMH of DM2 (follows with Dr. Dwyane [name] in endocrinology), gout, HTN, HLD, urethral stricture who presents today for follow up.   [name] notes that he is doing well, except that he has some leaking of urine at night.  He drinks water right up until bedtime.  He has a history of a urethral stricture and is on oxybutynin and tamsulosin.  He thought he was told it was best to drink water until bed time.  We discussed behavioral modification including stopping all fluids by 7pm for a bedtime around 10pm.  To evacuate his bladder well before bed and to see if this helps.  Otherwise, we may consider increasing tamsulosin or sending back to Urology.  It is apparent that he was instructed to intermittently catheterize, but he has not been doing this.    Our CCM case manager was helping him and printed out some instructions from his endocrinologist for him and I gave those to him today.   Otherwise doing well, requests refills on a few medications.    Review of Systems  Constitutional: Negative for activity change, appetite change, fatigue and fever.  Respiratory: Negative for cough, shortness of breath and wheezing.   Cardiovascular: Negative for chest pain and leg swelling.  Gastrointestinal: Negative for abdominal distention, constipation and diarrhea.  Genitourinary: Positive for enuresis and urgency. Negative for difficulty urinating and dysuria.  Musculoskeletal: Negative for arthralgias and back pain.  Neurological: Negative for dizziness and weakness.  Psychiatric/Behavioral: Negative for decreased concentration and dysphoric mood.       Objective:   Physical Exam Vitals and nursing note reviewed.  Constitutional:      General: He is not in acute distress.    Appearance: He is obese. He is not  toxic-appearing.  HENT:     Head: Normocephalic and atraumatic.  Cardiovascular:     Rate and Rhythm: Normal rate and regular rhythm.     Heart sounds: No murmur heard.   Pulmonary:     Effort: Pulmonary effort is normal. No respiratory distress.     Breath sounds: Normal breath sounds. No wheezing.  Abdominal:     General: Bowel sounds are normal. There is distension (obese).     Palpations: Abdomen is soft.     Tenderness: There is no abdominal tenderness.  Skin:    General: Skin is warm and dry.  Neurological:     Mental Status: He is alert and oriented to person, place, and time. Mental status is at baseline.  Psychiatric:        Mood and Affect: Mood normal.        Behavior: Behavior normal.     Uric acid and BMET today.      Assessment & Plan:  Return in 3-4 months.

## 2019-12-09 NOTE — Telephone Encounter (Signed)
colchicine 0.6 MG tablet, refill request @  [facility], [address] Phone:  [phone]  Fax:  [phone]

## 2019-12-09 NOTE — Progress Notes (Signed)
Internal Medicine Clinic Resident  I have personally reviewed this encounter including the documentation in this note and/or discussed this patient with the care management provider. I will address any urgent items identified by the care management provider and will communicate my actions to the patient's PCP. I have reviewed the patient's CCM visit with my supervising attending, Dr Heber [place].  [place], DO 12/09/2019

## 2019-12-09 NOTE — Assessment & Plan Note (Signed)
He notes that he is having urinary leakage at night.  This appears to be behavioral in nature.  He notes not being aware of need to catheterize and he is not doing that.  He has not been back to Urology.  We discussed behavior changes including decreasing fluids before bed and making a habit of emptying his bladder right before bed, even if he doesn't feel like he needs to urinate.  He will continue his oxybutynin and tamsulosin.  We could consider increasing his tamsulosin to 0.8mg  daily if he does not have any improvement.  We also discussed possibly needing to go back and see his Urologist for follow up cystoscopy.

## 2019-12-09 NOTE — Assessment & Plan Note (Signed)
Last Uric Acid was 5.5 on current allopurinol dosing.   Recheck uric acid and BMET today.

## 2019-12-09 NOTE — Telephone Encounter (Signed)
Dr Daryll Drown sent this am

## 2019-12-10 LAB — BMP8+ANION GAP
Anion Gap: 14 mmol/L (ref 10.0–18.0)
BUN/Creatinine Ratio: 13 (ref 10–24)
BUN: 16 mg/dL (ref 8–27)
CO2: 24 mmol/L (ref 20–29)
Calcium: 9.4 mg/dL (ref 8.6–10.2)
Chloride: 107 mmol/L — ABNORMAL HIGH (ref 96–106)
Creatinine, Ser: 1.2 mg/dL (ref 0.76–1.27)
GFR calc Af Amer: 63 mL/min/{1.73_m2} (ref >59)
GFR calc non Af Amer: 54 mL/min/{1.73_m2} — ABNORMAL LOW (ref >59)
Glucose: 145 mg/dL — ABNORMAL HIGH (ref 65–99)
Potassium: 4.3 mmol/L (ref 3.5–5.2)
Sodium: 145 mmol/L — ABNORMAL HIGH (ref 134–144)

## 2019-12-10 LAB — URIC ACID: Uric Acid: 4.6 mg/dL (ref 3.8–8.4)

## 2019-12-13 ENCOUNTER — Telehealth: Payer: Self-pay | Admitting: *Deleted

## 2019-12-13 NOTE — Telephone Encounter (Signed)
Thank you Glenda... 

## 2019-12-13 NOTE — Telephone Encounter (Signed)
Pt's here at the clinic requesting refills on Colchicine and Ditropan ;states he called 1 week ago and even came to the clinic with his bottles.   I called [facility] - [facility] stated Ditropan had been out of stock; it will be in after 3 PM today. And Colchicine 0.6 mg was refilled 11/5 but "No Print" - verbal order "Take 1 tab by mouth 2 times daily as needed qty# 60 x 3 RF" given to the pharmacist.  I informed pt of the above and to pick up his meds after 3 PM today.

## 2019-12-26 ENCOUNTER — Other Ambulatory Visit: Payer: Self-pay | Admitting: Cardiovascular Disease

## 2020-01-03 ENCOUNTER — Encounter: Payer: Self-pay | Admitting: Internal Medicine

## 2020-01-11 ENCOUNTER — Ambulatory Visit: Payer: Medicare (Managed Care) | Admitting: *Deleted

## 2020-01-11 ENCOUNTER — Other Ambulatory Visit: Payer: Medicare (Managed Care)

## 2020-01-11 DIAGNOSIS — I5022 Chronic systolic (congestive) heart failure: Secondary | ICD-10-CM

## 2020-01-11 DIAGNOSIS — I2510 Atherosclerotic heart disease of native coronary artery without angina pectoris: Secondary | ICD-10-CM

## 2020-01-11 DIAGNOSIS — E1142 Type 2 diabetes mellitus with diabetic polyneuropathy: Secondary | ICD-10-CM

## 2020-01-11 DIAGNOSIS — I10 Essential (primary) hypertension: Secondary | ICD-10-CM

## 2020-01-11 NOTE — Chronic Care Management (AMB) (Signed)
  Chronic Care Management   Note  01/11/2020 Name: [name] [name] MRN: [id number] DOB: Dec 27, 1931   Patient called this CCM RN with questions about his two health insurance plans: [employer] Medicare Advantage and [employer] PPO.  Follow up plan: Provided patient with [place] [facility] ([place]) phone number via [facility] and advised him to call and ask to meet in person with [facility] counselor since he has hearing and seeing deficits.  Patient voiced understanding and compliance.    The care management team will reach out to the patient again over the next 14-30 days.   Kelli Churn RN, CCM, [facility] RN Care Manager [phone]

## 2020-01-11 NOTE — Progress Notes (Signed)
Internal Medicine Clinic Resident  I have personally reviewed this encounter including the documentation in this note and/or discussed this patient with the care management provider. I will address any urgent items identified by the care management provider and will communicate my actions to the patient's PCP. I have reviewed the patient's CCM visit with my supervising attending, Dr Evette Doffing.  [place], MD 01/11/2020

## 2020-01-13 NOTE — Progress Notes (Signed)
Internal Medicine Clinic Attending  CCM services provided by the care management provider and their documentation were discussed with Dr.  Wynetta Emery . We reviewed the pertinent findings, urgent action items addressed by the resident and non-urgent items to be addressed by the PCP.  I agree with the assessment, diagnosis, and plan of care documented in the CCM and resident's note.  Axel Filler, MD 01/13/2020

## 2020-01-16 ENCOUNTER — Encounter: Payer: Self-pay | Admitting: Endocrinology

## 2020-01-16 ENCOUNTER — Other Ambulatory Visit: Payer: Self-pay

## 2020-01-16 ENCOUNTER — Ambulatory Visit (INDEPENDENT_AMBULATORY_CARE_PROVIDER_SITE_OTHER): Payer: Medicare (Managed Care) | Admitting: Endocrinology

## 2020-01-16 VITALS — BP 130/74 | HR 50 | Ht 73.0 in | Wt 275.2 lb

## 2020-01-16 DIAGNOSIS — Z794 Long term (current) use of insulin: Secondary | ICD-10-CM

## 2020-01-16 DIAGNOSIS — E1165 Type 2 diabetes mellitus with hyperglycemia: Secondary | ICD-10-CM | POA: Diagnosis not present

## 2020-01-16 NOTE — Progress Notes (Signed)
Patient ID: [name] [name], male   DOB: 04-11-1931, 86 y.o.   MRN: [id number]   Reason for Appointment: diabetes follow-up  History of Present Illness     DIABETES type II:  Diagnosis date: 1990  Previous history: He has been on insulin since 2005 Previously on Lantus and also subsequently basal bolus regimen but this was changed to NPH and regular insulin because of cost his A1c has been usually near 7% in the past He was switched from Lantus to NPH because of cost  Recent history:   INSULIN REGIMEN:    NOVOLIN-N -Relion 20 units before breakfast and 15 units supper Relion/ Regular Insulin 10 units twice daily before breakfast and dinner     Oral hypoglycemic drugs: Metformin ER 500 mg -- 2 in p.m.      Current blood sugar patterns and problems identified:   His A1c is last higher at 8.8   He was advised to take 12 units of regular insulin at lunchtime instead of 10, along with 20 NPH insulin 15 in the morning  He thinks he is taking the new doses  However he is a very poor historian and difficult to know what exactly he is doing   He still has high blood sugars at dinnertime but these occurred mostly last month   He is unable to explain why his blood sugars are higher at that time but also not checking blood sugars daily now  Does not think he is drinking regular soft drinks or tea  May be eating more fruit at times, sometimes 3-4 oranges per day  His lunchtime meal is variable and may skip sometimes; he cannot confirm whether he takes his regular insulin when he is eating a sandwich at lunch  Also in the evening he can sometimes be late for dinner but no hypoglycemia  Also FASTING blood sugars are fluctuating  As before he forgets to check his readings after meals  .       Monitors blood glucose: <1 times a day.    Glucometer:   One Touch Verio         Blood Glucose readings and median from download:   PRE-MEAL Fasting Lunch Dinner Bedtime  Overall  Glucose range: 84-210   136-362    Mean/median:  150   190  163   Previous readings:  PRE-MEAL Fasting Lunch Dinner Bedtime Overall  Glucose range:  80-326   152-296    Mean/median:  164   219  189   POST-MEAL PC Breakfast PC Lunch PC Dinner  Glucose range:   ?  Mean/median:       Meals: 2-3 meals per day.  breakfast is eggs,oatmeal usually.   Breakfast 7 AM, lunch 1 PM and dinner 6-7                 Dietician visit: Most recent:  02/2017     Weight control:   Wt Readings from Last 3 Encounters:  01/16/20 275 lb 3.2 oz (124.8 kg)  12/09/19 274 lb 12.8 oz (124.6 kg)  11/28/19 276 lb 6.4 oz (125.4 kg)           Diabetes labs:  Lab Results  Component Value Date   HGBA1C 8.8 (H) 11/16/2019   HGBA1C 8.2 (H) 08/09/2019   HGBA1C 7.5 (A) 05/11/2019   Lab Results  Component Value Date   MICROALBUR 3.5 (H) 08/09/2019   LDLCALC 46 08/09/2019   CREATININE 1.20 12/09/2019   No results  found for: FRUCTOSAMINE    Allergies as of 01/16/2020   No Known Allergies     Medication List       Accurate as of January 16, 2020  2:35 PM. If you have any questions, ask your nurse or doctor.        allopurinol 100 MG tablet Commonly known as: ZYLOPRIM Take 2 tablets by mouth once daily   amLODipine 10 MG tablet Commonly known as: NORVASC Take 1 tablet by mouth once daily   aspirin EC 81 MG tablet Take 1 tablet (81 mg total) by mouth daily.   atorvastatin 40 MG tablet Commonly known as: LIPITOR Take 1 tablet by mouth once daily   cilostazol 50 MG tablet Commonly known as: PLETAL Take 1 tablet (50 mg total) by mouth 2 (two) times daily.   colchicine 0.6 MG tablet Take 1 tablet by mouth twice daily   diclofenac Sodium 1 % Gel Commonly known as: VOLTAREN Apply 2 g topically 4 (four) times daily as needed (back pain).   EQ Loratadine 10 MG tablet Generic drug: loratadine TAKE 1 TABLET BY MOUTH DAILY AS NEEDED FOR RHINITIS.   furosemide 20 MG  tablet Commonly known as: LASIX Take 1 tablet (20 mg total) by mouth daily.   gabapentin 100 MG capsule Commonly known as: NEURONTIN Take 1 capsule (100 mg total) by mouth 3 (three) times daily.   insulin NPH Human 100 UNIT/ML injection Commonly known as: NOVOLIN N Inject 20 Units into the skin daily before breakfast. Per Dr [name] OV note of 11/17/19   insulin NPH Human 100 UNIT/ML injection Commonly known as: NOVOLIN N Inject 15 Units into the skin daily before supper. Per Dr [name] note of 11/17/19   Insulin Syringe-Needle U-100 31G X 15/64" 0.3 ML Misc Use to inject insulin 3 times a day   losartan 50 MG tablet Commonly known as: COZAAR Take 1 tablet by mouth once daily   metFORMIN 500 MG 24 hr tablet Commonly known as: GLUCOPHAGE-XR TAKE 2 TABLETS BY MOUTH ONCE DAILY WITH SUPPER   metoprolol tartrate 25 MG tablet Commonly known as: LOPRESSOR Take 1 tablet by mouth twice daily   insulin regular 100 units/mL injection Commonly known as: NOVOLIN R Inject 12 Units into the skin daily before lunch. Per Dr [name] OV note of 11/17/19   NOVOLIN R RELION IJ Inject 10 Units as directed 2 (two) times daily with a meal. Before  breakfast and supper Per Dr [name] OV of 10/62/69   OneTouch Delica Lancets Fine Misc Use to check blood sugar 2 times per day dx code E11.49   OneTouch Verio Flex System w/Device Kit Use OneTouch Verio Flex meter to check blood sugar twice daily.   OneTouch Verio test strip Generic drug: glucose blood USE AS DIRECTED TO CHECK BLOOD SUGAR TWICE DAILY   oxybutynin 5 MG tablet Commonly known as: DITROPAN Take 1 tablet (5 mg total) by mouth 3 (three) times daily.   pantoprazole 40 MG tablet Commonly known as: Protonix Take 1 tablet (40 mg total) by mouth daily.   tamsulosin 0.4 MG Caps capsule Commonly known as: FLOMAX Take 1 capsule (0.4 mg total) by mouth daily.       Allergies: No Known Allergies  Past Medical History:  Diagnosis  Date  . Benign prostatic hyperplasia (BPH) with urinary urge incontinence 11/13/2017  . Chronic left shoulder pain 11/13/2017  . Chronic non-seasonal allergic rhinitis 02/17/2006  . Chronic systolic heart failure ([place]) 10/31/2016   Echo (05/03/2015):  LVEF 96-29%, grade 2 diastolic dysfunction  . 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 Essential hypertension 11/04/2005  . Gastroesophageal reflux disease with hiatal hernia 11/04/2005  . History of prostate cancer 02/17/2006   Diagnosed 1995, s/p seed implantation in the late 1990's, PSA undetectable 04/10/2016  . Hyperlipidemia 11/04/2005  . Obesity (BMI 30.0-34.9) 11/13/2017  . Peripheral arterial disease ([place]) 09/30/2007   with claudication, failed attempt at LLE PTCA  . Type 2 diabetes mellitus with microalbuminuria, with long-term current use of insulin ([place]) 11/13/2017  . Type 2 diabetes mellitus with mild 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, bilateral ([place]) 03/24/2018  . Type 2 diabetes mellitus with peripheral neuropathy ([place]) 11/04/2005  . Urethral stricture in male 11/13/2017   Noted on cystoscopy 12/18/2014.  Reportedly asked to do chronic intermittent self catheterizations, but has not.    Past Surgical History:  Procedure Laterality Date  . CARDIAC CATHETERIZATION  10/05/08   REVEALS HYPOKINESIS OF THE LATERAL WALL AND EF 50-55%  . CORONARY ANGIOPLASTY WITH STENT PLACEMENT    . CORONARY ARTERY BYPASS GRAFT N/A 01/05/2013   Procedure: CORONARY ARTERY BYPASS GRAFTING (CABG);  Surgeon: Melrose Nakayama, MD;  Location: [facility];  Service: Open Heart Surgery;  Laterality: N/A;  . INTRAOPERATIVE TRANSESOPHAGEAL ECHOCARDIOGRAM N/A 01/05/2013   Procedure:  INTRAOPERATIVE TRANSESOPHAGEAL ECHOCARDIOGRAM;  Surgeon: Melrose Nakayama, MD;  Location: [facility];  Service: Open Heart Surgery;  Laterality: N/A;  . LEFT HEART CATHETERIZATION WITH CORONARY ANGIOGRAM N/A 01/03/2013   Procedure: LEFT HEART CATHETERIZATION WITH CORONARY ANGIOGRAM;  Surgeon: Lorretta Harp, MD;  Location: [facility] CATH LAB;  Service: Cardiovascular;  Laterality: N/A;  . LOWER EXTREMITY ANGIOGRAM Right 12/07/2012   unsuccessful attempt at percutaneous revascularization of a calcified long segment chronic total occlusion mid left SFA /notes 12/07/2012  . LOWER EXTREMITY ANGIOGRAM N/A 12/07/2012   Procedure: LOWER EXTREMITY ANGIOGRAM;  Surgeon: Lorretta Harp, MD;  Location: [facility] C[facility] LAB;  Service: Cardiovascular;  Laterality: N/A;  . PROSTATE SURGERY  1990's    Family History  Problem Relation Age of Onset  . Kidney failure Mother   . Hypertension Father   . Stroke Father   . Other Sister   . Other Brother   . Other Brother   . Other Brother   . Other Brother   . Other Brother   . Other Brother   . Other Brother   . Other Brother   . Healthy Daughter   . Healthy Daughter   . Healthy Daughter   . Healthy Son     Social History:  reports that he quit smoking about 39 years ago. His smoking use included cigarettes. He has a 15.00 pack-year smoking history. He has never used smokeless tobacco. He reports that he does not drink alcohol and does not use drugs.  Review of Systems:   Hypertension:  has had long-standing hypertension followed by his PCP and other physicians Blood pressure recently well controlled  BP Readings from Last 3 Encounters:  01/16/20 130/74  12/09/19 (!) 149/61  11/28/19 114/68     Lipids: Has been treated with Lipitor 40 mg Usually has lipid levels well below target   Lab Results  Component Value Date   CHOL 103 08/09/2019   HDL 34.50 (L)  08/09/2019   LDLCALC 46 08/09/2019   TRIG 111.0 08/09/2019   CHOLHDL 3 08/09/2019     Neuropathy: Has been treated with gabapentin 100 mg by his PCP  Claudication: Has pain in the lower legs when he is walking, more on the left calf, followed by cardiologist     Foot exam done in 02/2019, has absent pedal pulses otherwise normal monofilament sensation   RENAL dysfunction: This is mild and stable  He does not have microalbuminuria  Lab Results  Component Value Date   CREATININE 1.20 12/09/2019   CREATININE 1.53 (H) 11/16/2019   CREATININE 1.35 08/09/2019       LABS:  No visits with results within 1 Week(s) from this visit.  Latest known visit with results is:  Office Visit on 12/09/2019  Component Date Value Ref Range Status  . Glucose 12/09/2019 145* 65 - 99 mg/dL Final  . BUN 74/09/8932 16  8 - 27 mg/dL Final  . Creatinine, Ser 12/09/2019 1.20  0.76 - 1.27 mg/dL Final  . GFR calc non Af Amer 12/09/2019 54* >59 mL/min/1.73 Final  . GFR calc Af Amer 12/09/2019 63  >59 mL/min/1.73 Final   Comment: **In accordance with recommendations from the NKF-ASN Task force,**   [facility] is in the process of updating its eGFR calculation to the   2021 CKD-EPI creatinine equation that estimates kidney function   without a race variable.   . BUN/Creatinine Ratio 12/09/2019 13  10 - 24 Final  . Sodium 12/09/2019 145* 134 - 144 mmol/L Final  . Potassium 12/09/2019 4.3  3.5 - 5.2 mmol/L Final  . Chloride 12/09/2019 107* 96 - 106 mmol/L Final  . CO2 12/09/2019 24  20 - 29 mmol/L Final  . Anion Gap 12/09/2019 14.0  10.0 - 18.0 mmol/L Final  . Calcium 12/09/2019 9.4  8.6 - 10.2 mg/dL Final  . Uric Acid 48/12/7565 4.6  3.8 - 8.4 mg/dL Final              Therapeutic target for gout patients: <6.0     Examination:   BP 130/74   Pulse (!) 50   Ht 6\' 1"  (1.854 m)   Wt 275 lb 3.2 oz (124.8 kg)   SpO2 96%   BMI 36.31 kg/m   Body mass index is 36.31 kg/m.     ASSESSMENT/ PLAN:     Diabetes type 2 insulin-dependent:   His A1c is last higher at 8.8   See history  of present illness for detailed discussion of current diabetes management, blood sugar patterns and problems identified  He is on NPH and regular insulin as above with Metformin  He has inconsistent level of control and high nonfasting readings frequently Likely has difficulty with compliance with his insulin and possibly not taking the prescribed doses all the time He has difficulty remembering what his insulin doses have been and not clear why his blood sugars fluctuate However blood sugars are relatively better in the afternoon in the last 2 weeks This may possibly be from better compliance with lunchtime doses  However overall blood sugars are better than the last visit His level of control may be adequate for his age  Recommendations:   He will take 12 units of regular insulin at lunchtime consistently when he is eating a sandwich  Cut back on fruits  He will need to alternate checking readings in the mornings and dinnertime with lunch and bedtime.  Flowsheet and log book sheet given for him to keep  a record  Insulin doses were written out in the logbook also  He will try to enter his insulin doses when he takes them and bring record to the visit  Call if blood sugars are unusually high or low  HYPERTENSION: Blood pressure is recently better controlled        Patient Instructions  Take R  At lunch when eating sandwich  Limit oranges to 2daily        Elayne Snare 01/16/2020, 2:35 PM

## 2020-01-16 NOTE — Patient Instructions (Signed)
Take R  At lunch when eating sandwich  Limit oranges to 2daily

## 2020-01-17 ENCOUNTER — Telehealth: Payer: Medicare (Managed Care)

## 2020-01-17 ENCOUNTER — Ambulatory Visit: Payer: Medicare (Managed Care) | Admitting: *Deleted

## 2020-01-17 DIAGNOSIS — I5022 Chronic systolic (congestive) heart failure: Secondary | ICD-10-CM

## 2020-01-17 DIAGNOSIS — E1142 Type 2 diabetes mellitus with diabetic polyneuropathy: Secondary | ICD-10-CM

## 2020-01-17 DIAGNOSIS — Z794 Long term (current) use of insulin: Secondary | ICD-10-CM

## 2020-01-17 DIAGNOSIS — I10 Essential (primary) hypertension: Secondary | ICD-10-CM

## 2020-01-17 DIAGNOSIS — I2510 Atherosclerotic heart disease of native coronary artery without angina pectoris: Secondary | ICD-10-CM

## 2020-01-17 NOTE — Patient Instructions (Signed)
Visit Information It was nice speaking with you today. Goals Addressed              This Visit's Progress     Patient Stated   .  "I use a pill box and I take my medications like I'm supposed to." (pt-stated)        CARE PLAN ENTRY (see longitudinal plan of care for additional care plan information)  Current Barriers:  . Chronic Disease Management support, education, and care coordination needs related to CHF, CAD, HTN, HLD, and DMII-successful outreach to patient to complete follow up assessment, he says he saw Dr Dwyane Dee yesterday and is thinking of asking Dr Daryll Drown to manage his DM because he says even when he follows Dr [name] recommendations, his blood sugars are >200 at times in the evening  Clinical Goal(s) related to CHF, CAD, HTN, HLD, and DMII:  Over the next 30 -60 days, patient will:  . Work with the care management team to address educational, disease management, and care coordination needs  . Begin or continue self health monitoring activities as directed today  take medications as directed . Call provider office for new or worsened signs and symptoms Blood glucose findings outside established parameters and New or worsened symptom related to CHF, CAD, HTN, HLD, and DMII . Call care management team with questions or concerns . Verbalize basic understanding of patient centered plan of care established today  Interventions related to CHF, CAD, HTN, HLD, and DMII:  . Evaluation of current treatment plans and patient's adherence to plan as established by provider . Assessed patient understanding of disease states . Assessed patient's education and care coordination needs . Provided disease specific education to patient - reviewed notes from patient's visit with Dr Dwyane Dee on 01/17/20 and asked patient to repeat the dosages of both insulins, he denies symptoms of hypoglycemia and says he knows when his blood sugar is going low and how to treat it . Will message clinic provider to  check patient's Hgb A1C at  his 03/09/20 appointment and that patient wants to discuss management of his DM with a clinic provider  . Reviewed scheduled/upcoming provider appointments including: clinic appointment on 03/09/20  Patient Self Care Activities related to CHF, CAD, HTN, HLD, and DMII:  . Patient is unable to independently self-manage chronic health conditions  Please see past updates related to this goal by clicking on the "Past Updates" button in the selected goal         The patient verbalized understanding of instructions, educational materials, and care plan provided today and declined offer to receive copy of patient instructions, educational materials, and care plan.   The care management team will reach out to the patient again over the next 30-60 days.   Kelli Churn RN, CCM, [facility] RN Care Manager [phone]

## 2020-01-17 NOTE — Chronic Care Management (AMB) (Signed)
Chronic Care Management   Follow Up Note   01/17/2020 Name: [name] [name] MRN: [id number] DOB: September 09, 1931  Referred by: [name], MD Reason for referral : Chronic Care Management (IDDM, HTN, CAD, HF, HLD, PVD, Gout)   [name] [name] is a 86 y.o. year old male who is a primary care patient of [name], MD. The CCM team was consulted for assistance with chronic disease management and care coordination needs.    Review of patient status, including review of consultants reports, relevant laboratory and other test results, and collaboration with appropriate care team members and the patient's provider was performed as part of comprehensive patient evaluation and provision of chronic care management services.    SDOH (Social Determinants of Health) assessments performed: No See Care Plan activities for detailed interventions related to [facility])     Outpatient Encounter Medications as of 01/17/2020  Medication Sig  . allopurinol (ZYLOPRIM) 100 MG tablet Take 2 tablets by mouth once daily  . amLODipine (NORVASC) 10 MG tablet Take 1 tablet by mouth once daily  . aspirin EC 81 MG tablet Take 1 tablet (81 mg total) by mouth daily.  [name] atorvastatin (LIPITOR) 40 MG tablet Take 1 tablet by mouth once daily  . Blood Glucose Monitoring Suppl (ONETOUCH VERIO FLEX SYSTEM) w/Device KIT Use OneTouch Verio Flex meter to check blood sugar twice daily.  . cilostazol (PLETAL) 50 MG tablet Take 1 tablet (50 mg total) by mouth 2 (two) times daily.  . colchicine 0.6 MG tablet Take 1 tablet by mouth twice daily  . diclofenac Sodium (VOLTAREN) 1 % GEL Apply 2 g topically 4 (four) times daily as needed (back pain).  . EQ LORATADINE 10 MG tablet TAKE 1 TABLET BY MOUTH DAILY AS NEEDED FOR RHINITIS.  . furosemide (LASIX) 20 MG tablet Take 1 tablet (20 mg total) by mouth daily.  [name] gabapentin (NEURONTIN) 100 MG capsule Take 1 capsule (100 mg total) by mouth 3 (three) times daily.  . insulin NPH  Human (HUMULIN N,NOVOLIN N) 100 UNIT/ML injection Inject 20 Units into the skin daily before breakfast. Per Dr [name] OV note of 11/17/19  . insulin NPH Human (NOVOLIN N) 100 UNIT/ML injection Inject 15 Units into the skin daily before supper. Per Dr [name] note of 11/17/19 (Patient not taking: Reported on 01/16/2020)  . insulin regular (NOVOLIN R) 100 units/mL injection Inject 12 Units into the skin daily before lunch. Per Dr [name] OV note of 11/17/19 (Patient not taking: Reported on 01/16/2020)  . Insulin Regular Human (NOVOLIN R RELION IJ) Inject 10 Units as directed 2 (two) times daily with a meal. Before  breakfast and supper Per Dr [name] OV of 11/17/19  . Insulin Syringe-Needle U-100 31G X 15/64" 0.3 ML MISC Use to inject insulin 3 times a day  . losartan (COZAAR) 50 MG tablet Take 1 tablet by mouth once daily  . metFORMIN (GLUCOPHAGE-XR) 500 MG 24 hr tablet TAKE 2 TABLETS BY MOUTH ONCE DAILY WITH SUPPER  . metoprolol tartrate (LOPRESSOR) 25 MG tablet Take 1 tablet by mouth twice daily  . ONETOUCH DELICA LANCETS FINE MISC Use to check blood sugar 2 times per day dx code E11.49  . ONETOUCH VERIO test strip USE AS DIRECTED TO CHECK BLOOD SUGAR TWICE DAILY  . oxybutynin (DITROPAN) 5 MG tablet Take 1 tablet (5 mg total) by mouth 3 (three) times daily.  . pantoprazole (PROTONIX) 40 MG tablet Take 1 tablet (40 mg total) by mouth daily.  . tamsulosin (FLOMAX)  0.4 MG CAPS capsule Take 1 capsule (0.4 mg total) by mouth daily.   No facility-administered encounter medications on file as of 01/17/2020.     Objective:  Wt Readings from Last 3 Encounters:  01/16/20 275 lb 3.2 oz (124.8 kg)  12/09/19 274 lb 12.8 oz (124.6 kg)  11/28/19 276 lb 6.4 oz (125.4 kg)   BP Readings from Last 3 Encounters:  01/16/20 130/74  12/09/19 (!) 149/61  11/28/19 114/68   Lab Results  Component Value Date   HGBA1C 8.8 (H) 11/16/2019   HGBA1C 8.2 (H) 08/09/2019   HGBA1C 7.5 (A) 05/11/2019   Lab Results   Component Value Date   MICROALBUR 3.5 (H) 08/09/2019   LDLCALC 46 08/09/2019   CREATININE 1.20 12/09/2019    Goals Addressed              This Visit's Progress     Patient Stated   .  "I use a pill box and I take my medications like I'm supposed to." (pt-stated)        CARE PLAN ENTRY (see longitudinal plan of care for additional care plan information)  Current Barriers:  . Chronic Disease Management support, education, and care coordination needs related to CHF, CAD, HTN, HLD, and DMII-successful outreach to patient to complete follow up assessment, he says he saw Dr Dwyane Dee yesterday and is thinking of asking Dr Daryll Drown to manage his DM because he says even when he follows Dr [name] recommendations, his blood sugars are >200 at times in the evening  Clinical Goal(s) related to CHF, CAD, HTN, HLD, and DMII:  Over the next 30 -60 days, patient will:  . Work with the care management team to address educational, disease management, and care coordination needs  . Begin or continue self health monitoring activities as directed today  take medications as directed . Call provider office for new or worsened signs and symptoms Blood glucose findings outside established parameters and New or worsened symptom related to CHF, CAD, HTN, HLD, and DMII . Call care management team with questions or concerns . Verbalize basic understanding of patient centered plan of care established today  Interventions related to CHF, CAD, HTN, HLD, and DMII:  . Evaluation of current treatment plans and patient's adherence to plan as established by provider . Assessed patient understanding of disease states . Assessed patient's education and care coordination needs . Provided disease specific education to patient - reviewed notes from patient's visit with Dr Dwyane Dee on 01/17/20 and asked patient to repeat the dosages of both insulins, he denies symptoms of hypoglycemia and says he knows when his blood sugar is  going low and how to treat it . Will message clinic provider to check patient's Hgb A1C at  his 03/09/20 appointment and that patient wants to discuss management of his DM with a clinic provider  . Reviewed scheduled/upcoming provider appointments including: clinic appointment on 03/09/20  Patient Self Care Activities related to CHF, CAD, HTN, HLD, and DMII:  . Patient is unable to independently self-manage chronic health conditions  Please see past updates related to this goal by clicking on the "Past Updates" button in the selected goal           Plan:   The care management team will reach out to the patient again over the next 30-60 days.    Kelli Churn RN, CCM, [facility] RN Care Manager [phone]

## 2020-01-17 NOTE — Progress Notes (Signed)
Internal Medicine Clinic Resident  I have personally reviewed this encounter including the documentation in this note and/or discussed this patient with the care management provider. I will address any urgent items identified by the care management provider and will communicate my actions to the patient's PCP. I have reviewed the patient's CCM visit with my supervising attending, Dr Rebeca Alert.  Mitzi Hansen, MD Internal Medicine Resident PGY-2 [facility] Pager: [phone] 01/17/2020 4:20 PM

## 2020-01-18 NOTE — Progress Notes (Signed)
Internal Medicine Clinic Attending  CCM services provided by the care management provider and their documentation were reviewed with Dr. Christian.  We reviewed the pertinent findings, urgent action items addressed by the resident and non-urgent items to be addressed by the PCP.  I agree with the assessment, diagnosis, and plan of care documented in the CCM and resident's note.  Raydan Schlabach N Saliyah Gillin, MD 01/18/2020  

## 2020-02-09 ENCOUNTER — Other Ambulatory Visit: Payer: Self-pay | Admitting: Cardiovascular Disease

## 2020-02-17 ENCOUNTER — Ambulatory Visit: Payer: Medicare (Managed Care) | Admitting: *Deleted

## 2020-02-17 DIAGNOSIS — I10 Essential (primary) hypertension: Secondary | ICD-10-CM

## 2020-02-17 DIAGNOSIS — E1165 Type 2 diabetes mellitus with hyperglycemia: Secondary | ICD-10-CM

## 2020-02-17 DIAGNOSIS — I2510 Atherosclerotic heart disease of native coronary artery without angina pectoris: Secondary | ICD-10-CM

## 2020-02-17 DIAGNOSIS — E7800 Pure hypercholesterolemia, unspecified: Secondary | ICD-10-CM

## 2020-02-17 DIAGNOSIS — I5022 Chronic systolic (congestive) heart failure: Secondary | ICD-10-CM

## 2020-02-17 NOTE — Chronic Care Management (AMB) (Signed)
Chronic Care Management   CCM RN Visit Note  02/17/2020 Name: [name] [name] MRN: [id number] DOB: 1932-01-09  Subjective: [name] [name] is a 87 y.o. year old male who is a primary care patient of [name], MD. The care management team was consulted for assistance with disease management and care coordination needs.    Engaged with patient by telephone for follow up visit in response to provider referral for case management and/or care coordination services.   Consent to Services:  The patient was given information about Chronic Care Management services, agreed to services, and gave verbal consent prior to initiation of services.  Please see initial visit note for detailed documentation.   Patient agreed to services and verbal consent obtained.   Assessment: Review of patient past medical history, allergies, medications, health status, including review of consultants reports, laboratory and other test data, was performed as part of comprehensive evaluation and provision of chronic care management services.   SDOH (Social Determinants of Health) assessments and interventions performed:    CCM Care Plan  No Known Allergies  Outpatient Encounter Medications as of 02/17/2020  Medication Sig  . cilostazol (PLETAL) 50 MG tablet Take 1 tablet by mouth twice daily  . allopurinol (ZYLOPRIM) 100 MG tablet Take 2 tablets by mouth once daily  . amLODipine (NORVASC) 10 MG tablet Take 1 tablet by mouth once daily  . aspirin EC 81 MG tablet Take 1 tablet (81 mg total) by mouth daily.  [name] atorvastatin (LIPITOR) 40 MG tablet Take 1 tablet by mouth once daily  . Blood Glucose Monitoring Suppl (ONETOUCH VERIO FLEX SYSTEM) w/Device KIT Use OneTouch Verio Flex meter to check blood sugar twice daily.  . colchicine 0.6 MG tablet Take 1 tablet by mouth twice daily  . diclofenac Sodium (VOLTAREN) 1 % GEL Apply 2 g topically 4 (four) times daily as needed (back pain).  . EQ LORATADINE 10 MG  tablet TAKE 1 TABLET BY MOUTH DAILY AS NEEDED FOR RHINITIS.  . furosemide (LASIX) 20 MG tablet Take 1 tablet (20 mg total) by mouth daily.  [name] gabapentin (NEURONTIN) 100 MG capsule Take 1 capsule (100 mg total) by mouth 3 (three) times daily.  . insulin NPH Human (HUMULIN N,NOVOLIN N) 100 UNIT/ML injection Inject 20 Units into the skin daily before breakfast. Per Dr [name] OV note of 11/17/19  . insulin NPH Human (NOVOLIN N) 100 UNIT/ML injection Inject 15 Units into the skin daily before supper. Per Dr [name] note of 11/17/19 (Patient not taking: Reported on 01/16/2020)  . insulin regular (NOVOLIN R) 100 units/mL injection Inject 12 Units into the skin daily before lunch. Per Dr [name] OV note of 11/17/19 (Patient not taking: Reported on 01/16/2020)  . Insulin Regular Human (NOVOLIN R RELION IJ) Inject 10 Units as directed 2 (two) times daily with a meal. Before  breakfast and supper Per Dr [name] OV of 11/17/19  . Insulin Syringe-Needle U-100 31G X 15/64" 0.3 ML MISC Use to inject insulin 3 times a day  . losartan (COZAAR) 50 MG tablet Take 1 tablet by mouth once daily  . metFORMIN (GLUCOPHAGE-XR) 500 MG 24 hr tablet TAKE 2 TABLETS BY MOUTH ONCE DAILY WITH SUPPER  . metoprolol tartrate (LOPRESSOR) 25 MG tablet Take 1 tablet by mouth twice daily  . ONETOUCH DELICA LANCETS FINE MISC Use to check blood sugar 2 times per day dx code E11.49  . ONETOUCH VERIO test strip USE AS DIRECTED TO CHECK BLOOD SUGAR TWICE DAILY  . oxybutynin (DITROPAN)  5 MG tablet Take 1 tablet (5 mg total) by mouth 3 (three) times daily.  . pantoprazole (PROTONIX) 40 MG tablet Take 1 tablet (40 mg total) by mouth daily.  . tamsulosin (FLOMAX) 0.4 MG CAPS capsule Take 1 capsule (0.4 mg total) by mouth daily.   No facility-administered encounter medications on file as of 02/17/2020.    Patient Active Problem List   Diagnosis Date Noted  . Chronic gout 05/27/2018  . Type 2 diabetes mellitus with mild nonproliferative  diabetic retinopathy without macular edema, bilateral ([place]) 03/24/2018  . Type 2 diabetes mellitus with microalbuminuria, with long-term current use of insulin ([place]) 11/13/2017  . Healthcare maintenance 11/13/2017  . Chronic left shoulder pain 11/13/2017  . Severe obesity (BMI 35.0-35.9 with comorbidity) ([place]) 11/13/2017  . Urethral stricture in male 11/13/2017  . Benign prostatic hyperplasia (BPH) with urinary urge incontinence 11/13/2017  . Chronic systolic heart failure ([place]) 10/31/2016  . Arthritis 12/19/2014  . Coronary artery disease involving native coronary artery of native heart without angina pectoris 09/30/2007  . Peripheral arterial disease ([place]) 09/30/2007  . Chronic non-seasonal allergic rhinitis 02/17/2006  . History of prostate cancer 02/17/2006  . Type 2 diabetes mellitus with peripheral neuropathy ([place]) 11/04/2005  . Hyperlipidemia 11/04/2005  . Essential hypertension 11/04/2005  . Gastroesophageal reflux disease with hiatal hernia 11/04/2005    Conditions to be addressed/monitored:IDDM, HTN, CAD, HF, HLD, PVD, Gout, GERD  Care Plan : CCM RN- Diabetes Type 2 (Adult)  Updates made by Barrington Ellison, RN since 02/17/2020 12:00 AM    Problem: Glycemic Management (Diabetes, Type 2)   Priority: High    Long-Range Goal: IDDM, HTN, HF, HLD Management Optimized   Start Date: 07/26/2019  This Visit's Progress: On track  Priority: High  Note:   CARE PLAN ENTRY (see longitudinal plan of care for additional care plan information)  Current Barriers:  . Chronic Disease Management support, education, and care coordination needs related to CHF, CAD, HTN, HLD, and DMII-successful outreach to patient to complete follow up assessment, he says he is odin OK, denies lows or extremely high blood sugars, says he will talk to Dr Daryll Drown about managing his DM when he sees her in February  Clinical Goal(s) related to CHF, CAD, HTN, HLD, and DMII:  Over the next 30 -60 days, patient will:   . Work with the care management team to address educational, disease management, and care coordination needs  . Begin or continue self health monitoring activities as directed today take medications as directed . Call provider office for new or worsened signs and symptoms Blood glucose findings outside established parameters and New or worsened symptom related to CHF, CAD, HTN, HLD, and DMII . Call care management team with questions or concerns . Verbalize basic understanding of patient centered plan of care established today  Interventions related to CHF, CAD, HTN, HLD, and DMII:  . Evaluation of current treatment plans for HF, CAD, HTN, HLD and DM and patient's adherence to plan as established by provider . Assessed patient understanding of disease states . Appropriate assessments completed . Assessed patient's education and care coordination needs . Assessed self monitored CBGs and reviewed targets . Assessed frequency of hypoglycemia and hyperglycemia and reviewed treatment for both . Messaged clinic provider to check patient's Hgb A1C at his 03/09/20 appointment via appointment note . Instructed patient to discuss DM management with Dr Daryll Drown at clinic visit on 03/09/20 . Reviewed scheduled/upcoming provider appointments including: clinic appointment on 03/09/20  Patient Self  Care Activities related to CHF, CAD, HTN, HLD, and DMII:  . Patient is unable to independently self-manage chronic health conditions . - check blood sugar at prescribed times . - check blood sugar if I feel it is too high or too low . - enter blood sugar readings and medication or insulin into daily log . - take the blood sugar log to all doctor visits . - take the blood sugar meter to all doctor visits . - notify provider of any health concerns . -talk to Dr Daryll Drown about managing my DM       Plan:The care management team will reach out to the patient again over the next 30-60 days.   Kelli Churn RN, CCM,  [facility] RN Care Manager [phone]

## 2020-02-17 NOTE — Progress Notes (Signed)
Internal Medicine Clinic Resident  I have personally reviewed this encounter including the documentation in this note and/or discussed this patient with the care management provider. I will address any urgent items identified by the care management provider and will communicate my actions to the patient's PCP. I have reviewed the patient's CCM visit with my supervising attending, Dr. Williams.   [name] Schubring, MD 02/17/2020    

## 2020-02-17 NOTE — Patient Instructions (Signed)
Visit Information  It was nice speaking with you today.  Patient Care Plan: CCM RN- Diabetes Type 2 (Adult)    Problem Identified: Glycemic Management (Diabetes, Type 2)   Priority: High    Long-Range Goal: IDDM, HTN, HF, HLD Management Optimized   Start Date: 07/26/2019  This Visit's Progress: On track  Priority: High  Note:   CARE PLAN ENTRY (see longitudinal plan of care for additional care plan information)  Current Barriers:  . Chronic Disease Management support, education, and care coordination needs related to CHF, CAD, HTN, HLD, and DMII-successful outreach to patient to complete follow up assessment, he says he is odin OK, denies lows or extremely high blood sugars, says he will talk to Dr Daryll Drown about managing his DM when he sees her in February  Clinical Goal(s) related to CHF, CAD, HTN, HLD, and DMII:  Over the next 30 -60 days, patient will:  . Work with the care management team to address educational, disease management, and care coordination needs  . Begin or continue self health monitoring activities as directed today take medications as directed . Call provider office for new or worsened signs and symptoms Blood glucose findings outside established parameters and New or worsened symptom related to CHF, CAD, HTN, HLD, and DMII . Call care management team with questions or concerns . Verbalize basic understanding of patient centered plan of care established today  Interventions related to CHF, CAD, HTN, HLD, and DMII:  . Evaluation of current treatment plans for HF, CAD, HTN, HLD and DM and patient's adherence to plan as established by provider . Assessed patient understanding of disease states . Appropriate assessments completed . Assessed patient's education and care coordination needs . Assessed self monitored CBGs and reviewed targets . Assessed frequency of hypoglycemia and hyperglycemia and reviewed treatment for both . Messaged clinic provider to check  patient's Hgb A1C at his 03/09/20 appointment via appointment note . Instructed patient to discuss DM management with Dr Daryll Drown . Reviewed scheduled/upcoming provider appointments including: clinic appointment on 03/09/20  Patient Self Care Activities related to CHF, CAD, HTN, HLD, and DMII:  . Patient is unable to independently self-manage chronic health conditions . - check blood sugar at prescribed times . - check blood sugar if I feel it is too high or too low . - enter blood sugar readings and medication or insulin into daily log . - take the blood sugar log to all doctor visits . - take the blood sugar meter to all doctor visits . - notify provider of any health concerns . -talk to Dr Daryll Drown about managing my DM       The patient verbalized understanding of instructions, educational materials, and care plan provided today and declined offer to receive copy of patient instructions, educational materials, and care plan.   The care management team will reach out to the patient again over the next 30-60 days.   Kelli Churn RN, CCM, [facility] RN Care Manager [phone]

## 2020-02-21 NOTE — Progress Notes (Signed)
Documentation of CCM encounter reviewed and approved. 

## 2020-03-02 ENCOUNTER — Telehealth: Payer: Self-pay | Admitting: Cardiovascular Disease

## 2020-03-02 ENCOUNTER — Other Ambulatory Visit: Payer: Self-pay | Admitting: Cardiovascular Disease

## 2020-03-02 ENCOUNTER — Other Ambulatory Visit: Payer: Self-pay | Admitting: Endocrinology

## 2020-03-02 NOTE — Telephone Encounter (Signed)
*  STAT* If patient is at the pharmacy, call can be transferred to refill team.   1. Which medications need to be refilled? (please list name of each medication and dose if known) amLODipine (NORVASC) 10 MG tablet  2. Which pharmacy/location (including street and city if local pharmacy) is medication to be sent to? [place], [address]  3. Do they need a 30 day or 90 day supply? 90 day   Patient is out of medication.

## 2020-03-09 ENCOUNTER — Encounter: Payer: Self-pay | Admitting: Internal Medicine

## 2020-03-09 ENCOUNTER — Ambulatory Visit (INDEPENDENT_AMBULATORY_CARE_PROVIDER_SITE_OTHER): Payer: BC Managed Care – PPO | Admitting: Internal Medicine

## 2020-03-09 ENCOUNTER — Telehealth: Payer: Medicare (Managed Care)

## 2020-03-09 ENCOUNTER — Other Ambulatory Visit: Payer: Self-pay

## 2020-03-09 VITALS — BP 120/55 | HR 75 | Temp 97.5°F | Ht 73.0 in | Wt 275.6 lb

## 2020-03-09 DIAGNOSIS — E1165 Type 2 diabetes mellitus with hyperglycemia: Secondary | ICD-10-CM | POA: Diagnosis not present

## 2020-03-09 DIAGNOSIS — M1990 Unspecified osteoarthritis, unspecified site: Secondary | ICD-10-CM

## 2020-03-09 DIAGNOSIS — N401 Enlarged prostate with lower urinary tract symptoms: Secondary | ICD-10-CM

## 2020-03-09 DIAGNOSIS — Z6835 Body mass index (BMI) 35.0-35.9, adult: Secondary | ICD-10-CM

## 2020-03-09 DIAGNOSIS — Z794 Long term (current) use of insulin: Secondary | ICD-10-CM

## 2020-03-09 DIAGNOSIS — Z0000 Encounter for general adult medical examination without abnormal findings: Secondary | ICD-10-CM

## 2020-03-09 DIAGNOSIS — I10 Essential (primary) hypertension: Secondary | ICD-10-CM

## 2020-03-09 DIAGNOSIS — Z8546 Personal history of malignant neoplasm of prostate: Secondary | ICD-10-CM | POA: Diagnosis not present

## 2020-03-09 DIAGNOSIS — K219 Gastro-esophageal reflux disease without esophagitis: Secondary | ICD-10-CM

## 2020-03-09 DIAGNOSIS — M1A9XX0 Chronic gout, unspecified, without tophus (tophi): Secondary | ICD-10-CM

## 2020-03-09 DIAGNOSIS — I739 Peripheral vascular disease, unspecified: Secondary | ICD-10-CM

## 2020-03-09 DIAGNOSIS — I2510 Atherosclerotic heart disease of native coronary artery without angina pectoris: Secondary | ICD-10-CM

## 2020-03-09 DIAGNOSIS — I5022 Chronic systolic (congestive) heart failure: Secondary | ICD-10-CM

## 2020-03-09 DIAGNOSIS — E7800 Pure hypercholesterolemia, unspecified: Secondary | ICD-10-CM

## 2020-03-09 DIAGNOSIS — R809 Proteinuria, unspecified: Secondary | ICD-10-CM

## 2020-03-09 DIAGNOSIS — N3941 Urge incontinence: Secondary | ICD-10-CM

## 2020-03-09 DIAGNOSIS — E1129 Type 2 diabetes mellitus with other diabetic kidney complication: Secondary | ICD-10-CM

## 2020-03-09 DIAGNOSIS — K449 Diaphragmatic hernia without obstruction or gangrene: Secondary | ICD-10-CM

## 2020-03-09 LAB — POCT GLYCOSYLATED HEMOGLOBIN (HGB A1C): Hemoglobin A1C: 7.2 % — AB (ref 4.0–5.6)

## 2020-03-09 LAB — GLUCOSE, CAPILLARY: Glucose-Capillary: 300 mg/dL — ABNORMAL HIGH (ref 70–99)

## 2020-03-09 MED ORDER — ONETOUCH DELICA LANCETS 30G MISC: 1.0000 | Freq: Two times a day (BID) | 6 refills | Status: DC

## 2020-03-09 NOTE — Progress Notes (Signed)
   Subjective:    Patient ID: [name] [name], male    DOB: 1931/08/19, 87 y.o.   MRN: [id number]  CC: Follow up for elevated blood pressure  HPI  [name] [name] is an 87 year old man with PMH of BPH, gout, CAD, HFrEF (EF 45-50%, Grade 2 DD), GERD, HTN, DM2 who presents for follow up.   Mr. Ontiveros reports that he is frustrated with his endocrinologist and would like to change over his care to our clinic for DM2 management.  He notes headaches after his insulin dosing, which is possible.  He feels that nothing has been done and he hasn't been offered oral medications to try and get off the insulin.  He has not tried anything for the headaches and we discussed trying tylenol.  I reviewed his blood sugar log and he has no lows, a few highs many weeks ago.  He notes difficulty getting blood for checking his blood sugars.    He has a history of prostate cancer.  He cannot remember the last time he went to the Urologist and he notes good control of his urinary incontinence and stricture.    He has not had colon cancer screening.  We discussed colonoscopy vs. FIT testing.  He opted for FIT testing and I reminded him that he would need a colonoscopy if this turned out to be positive.     Review of Systems  Constitutional: Negative for activity change, appetite change, fatigue and fever.  Respiratory: Negative for cough and shortness of breath.   Cardiovascular: Negative for chest pain, palpitations and leg swelling.  Gastrointestinal: Negative for abdominal distention and abdominal pain.  Endocrine: Negative for polydipsia, polyphagia and polyuria.  Genitourinary: Negative for difficulty urinating, frequency and hematuria.  Musculoskeletal: Negative for arthralgias, back pain and gait problem.  Neurological: Positive for headaches. Negative for dizziness and weakness.  Psychiatric/Behavioral: Negative for decreased concentration and dysphoric mood.       Objective:   Physical Exam Vitals and  nursing note reviewed.  Constitutional:      General: He is not in acute distress.    Appearance: He is obese. He is not toxic-appearing.  HENT:     Head: Normocephalic and atraumatic.  Cardiovascular:     Rate and Rhythm: Normal rate and regular rhythm.     Heart sounds: No murmur heard.   Pulmonary:     Effort: Pulmonary effort is normal. No respiratory distress.     Breath sounds: Normal breath sounds. No wheezing.  Musculoskeletal:        General: No swelling or tenderness.     Right lower leg: No edema.     Left lower leg: No edema.  Neurological:     General: No focal deficit present.     Mental Status: He is alert. Mental status is at baseline.  Psychiatric:        Mood and Affect: Mood normal.        Behavior: Behavior normal.     A1C is 7.2 today BMET and PSA ordered.      Assessment & Plan:  Return in 3 months for DM management.

## 2020-03-09 NOTE — Patient Instructions (Signed)
[name] - -  Your blood glucose is doing very well. I would continue your insulin as you have been.    We will send in new lancets to see if this will help you check your blood sugar.   Please come back to see me in 3 months.    For your headaches, please try Tylenol 1000mg  after injecting insulin to see if this helps.   Thank you!

## 2020-03-10 LAB — CMP14 + ANION GAP
ALT: 19 IU/L (ref 0–44)
AST: 18 IU/L (ref 0–40)
Albumin/Globulin Ratio: 1.5 (ref 1.2–2.2)
Albumin: 3.9 g/dL (ref 3.6–4.6)
Alkaline Phosphatase: 117 IU/L (ref 44–121)
Anion Gap: 12 mmol/L (ref 10.0–18.0)
BUN/Creatinine Ratio: 10 (ref 10–24)
BUN: 13 mg/dL (ref 8–27)
Bilirubin Total: 0.4 mg/dL (ref 0.0–1.2)
CO2: 23 mmol/L (ref 20–29)
Calcium: 8.7 mg/dL (ref 8.6–10.2)
Chloride: 106 mmol/L (ref 96–106)
Creatinine, Ser: 1.24 mg/dL (ref 0.76–1.27)
GFR calc Af Amer: 60 mL/min/{1.73_m2} (ref >59)
GFR calc non Af Amer: 52 mL/min/{1.73_m2} — ABNORMAL LOW (ref >59)
Globulin, Total: 2.6 g/dL (ref 1.5–4.5)
Glucose: 238 mg/dL — ABNORMAL HIGH (ref 65–99)
Potassium: 3.6 mmol/L (ref 3.5–5.2)
Sodium: 141 mmol/L (ref 134–144)
Total Protein: 6.5 g/dL (ref 6.0–8.5)

## 2020-03-10 LAB — PSA: Prostate Specific Ag, Serum: <0.1 ng/mL (ref 0.0–4.0)

## 2020-03-12 ENCOUNTER — Ambulatory Visit: Payer: BC Managed Care – PPO | Admitting: *Deleted

## 2020-03-12 DIAGNOSIS — E1165 Type 2 diabetes mellitus with hyperglycemia: Secondary | ICD-10-CM

## 2020-03-12 DIAGNOSIS — I5022 Chronic systolic (congestive) heart failure: Secondary | ICD-10-CM

## 2020-03-12 DIAGNOSIS — Z794 Long term (current) use of insulin: Secondary | ICD-10-CM

## 2020-03-12 DIAGNOSIS — I10 Essential (primary) hypertension: Secondary | ICD-10-CM

## 2020-03-12 DIAGNOSIS — M1A9XX0 Chronic gout, unspecified, without tophus (tophi): Secondary | ICD-10-CM

## 2020-03-12 NOTE — Patient Instructions (Signed)
Visit Information It was nice speaking with you today. PATIENT GOALS: Patient Care Plan: CCM RN- Diabetes Type 2 (Adult)    Problem Identified: Glycemic Management (Diabetes, Type 2)   Priority: High    Long-Range Goal: IDDM, HTN, HF, HLD Management Optimized   Start Date: 07/26/2019  Expected End Date: 10/25/2020  This Visit's Progress: On track  Recent Progress: On track  Priority: High  Note:   CARE PLAN ENTRY (see longitudinal plan of care for additional care plan information)  Current Barriers:  . Chronic Disease Management support, education, and care coordination needs related to CHF, CAD, HTN, HLD, and DMII-successful outreach to patient to complete follow up assessment, he says he is doing OK, says he has not pick up the new lancets Dr Daryll Drown ordered for him to help with requiring multiple finger sticks to obtain enough blood to test when checking CBG, reports fasting blood sugar today of 142, denies lows or extremely high blood sugars, says he talked to Dr Daryll Drown about managing his DM during hs appointment on 03/09/20 and she agreed  Clinical Goal(s) related to CHF, CAD, HTN, HLD, and DMII:  Over the next 30 -60 days, patient will:  . Work with the care management team to address educational, disease management, and care coordination needs  . Begin or continue self health monitoring activities as directed today take medications as directed . Call provider office for new or worsened signs and symptoms Blood glucose findings outside established parameters and New or worsened symptom related to CHF, CAD, HTN, HLD, and DMII . Call care management team with questions or concerns . Verbalize basic understanding of patient centered plan of care established today  Interventions related to CHF, CAD, HTN, HLD, and DMII:  . Evaluation of current treatment plans for HF, CAD, HTN, HLD and DM and patient's adherence to plan as established by provider . Assessed patient understanding of disease  states . Appropriate assessments completed . Assessed patient's education and care coordination needs . Assessed self monitored CBGs and reviewed targets . Assessed frequency of hypoglycemia and hyperglycemia and reviewed treatment for both . Reviewed 03/09/20 clinic note and the results of Hgb A1C done 03/09/20 and congratulated patient on the significant improvement without increased incidences of hypoglycemia . Reviewed scheduled/upcoming provider appointments including: for labs and then office visit with Dr Dwyane Dee next month which he says he will cancel  Patient Self Care Activities related to CHF, CAD, HTN, HLD, and DMII:  . Patient is unable to independently self-manage chronic health conditions . - check blood sugar at prescribed times . - check blood sugar if I feel it is too high or too low . - enter blood sugar readings and medication or insulin into daily log . - take the blood sugar log to all doctor visits . - take the blood sugar meter to all doctor visits . - notify provider of any health concerns       The patient verbalized understanding of instructions, educational materials, and care plan provided today and declined offer to receive copy of patient instructions, educational materials, and care plan.   The care management team will reach out to the patient again over the next 30-60 days.   Kelli Churn RN, CCM, [facility] RN Care Manager [phone]

## 2020-03-12 NOTE — Chronic Care Management (AMB) (Signed)
Chronic Care Management   CCM RN Visit Note  03/12/2020 Name: [name] [name] MRN: [id number] DOB: 08/13/1931  Subjective: [name] [name] is a 87 y.o. year old male who is a primary care patient of [name], MD. The care management team was consulted for assistance with disease management and care coordination needs.    Engaged with patient by telephone for follow up visit in response to provider referral for case management and/or care coordination services.   Consent to Services:  The patient was given information about Chronic Care Management services, agreed to services, and gave verbal consent prior to initiation of services.  Please see initial visit note for detailed documentation.   Patient agreed to services and verbal consent obtained.   Assessment: Review of patient past medical history, allergies, medications, health status, including review of consultants reports, laboratory and other test data, was performed as part of comprehensive evaluation and provision of chronic care management services.   SDOH (Social Determinants of Health) assessments and interventions performed:    CCM Care Plan  No Known Allergies  Outpatient Encounter Medications as of 03/12/2020  Medication Sig  . cilostazol (PLETAL) 50 MG tablet Take 1 tablet by mouth twice daily  . allopurinol (ZYLOPRIM) 100 MG tablet Take 2 tablets by mouth once daily  . amLODipine (NORVASC) 10 MG tablet Take 1 tablet by mouth once daily  . aspirin EC 81 MG tablet Take 1 tablet (81 mg total) by mouth daily.  [name] atorvastatin (LIPITOR) 40 MG tablet Take 1 tablet by mouth once daily  . Blood Glucose Monitoring Suppl (ONETOUCH VERIO FLEX SYSTEM) w/Device KIT Use OneTouch Verio Flex meter to check blood sugar twice daily.  . colchicine 0.6 MG tablet Take 1 tablet by mouth twice daily  . diclofenac Sodium (VOLTAREN) 1 % GEL Apply 2 g topically 4 (four) times daily as needed (back pain).  . EQ LORATADINE 10 MG tablet  TAKE 1 TABLET BY MOUTH DAILY AS NEEDED FOR RHINITIS.  . furosemide (LASIX) 20 MG tablet Take 1 tablet (20 mg total) by mouth daily.  [name] gabapentin (NEURONTIN) 100 MG capsule Take 1 capsule (100 mg total) by mouth 3 (three) times daily.  . insulin NPH Human (HUMULIN N,NOVOLIN N) 100 UNIT/ML injection Inject 20 Units into the skin daily before breakfast. Per Dr [name] OV note of 11/17/19  . Insulin Regular Human (NOVOLIN R RELION IJ) Inject 10 Units as directed 2 (two) times daily with a meal. Before  breakfast and supper Per Dr [name] OV of 11/17/19  . Insulin Syringe-Needle U-100 31G X 15/64" 0.3 ML MISC Use to inject insulin 3 times a day  . losartan (COZAAR) 50 MG tablet Take 1 tablet by mouth once daily  . metFORMIN (GLUCOPHAGE-XR) 500 MG 24 hr tablet TAKE 2 TABLETS BY MOUTH ONCE DAILY WITH SUPPER  . metoprolol tartrate (LOPRESSOR) 25 MG tablet Take 1 tablet by mouth twice daily  . OneTouch Delica Lancets 37T MISC 1 each by Does not apply route 2 (two) times daily. E 11.9 diagnosis  . ONETOUCH VERIO test strip USE AS DIRECTED TO CHECK BLOOD SUGAR TWICE DAILY  . oxybutynin (DITROPAN) 5 MG tablet Take 1 tablet (5 mg total) by mouth 3 (three) times daily.  . pantoprazole (PROTONIX) 40 MG tablet Take 1 tablet (40 mg total) by mouth daily.  . tamsulosin (FLOMAX) 0.4 MG CAPS capsule Take 1 capsule (0.4 mg total) by mouth daily.   No facility-administered encounter medications on file as of 03/12/2020.  Patient Active Problem List   Diagnosis Date Noted  . Chronic gout 05/27/2018  . Type 2 diabetes mellitus with mild 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, bilateral ([place]) 03/24/2018  . Type 2 diabetes mellitus with microalbuminuria, with long-term current use of insulin ([place]) 11/13/2017  . Healthcare maintenance 11/13/2017  . Chronic left shoulder pain 11/13/2017  . Severe obesity (BMI 35.0-35.9 with comorbidity) ([place]) 11/13/2017  . Urethral stricture in male 11/13/2017   . Benign prostatic hyperplasia (BPH) with urinary urge incontinence 11/13/2017  . Chronic systolic heart failure ([place]) 10/31/2016  . Arthritis 12/19/2014  . Coronary artery disease involving native coronary artery of native heart without angina pectoris 09/30/2007  . Peripheral arterial disease ([place]) 09/30/2007  . Chronic non-seasonal allergic rhinitis 02/17/2006  . History of prostate cancer 02/17/2006  . Type 2 diabetes mellitus with peripheral neuropathy ([place]) 11/04/2005  . Hyperlipidemia 11/04/2005  . Essential hypertension 11/04/2005  . Gastroesophageal reflux disease with hiatal hernia 11/04/2005    Conditions to be addressed/monitored: IDDM, HTN, CAD, HF, HLD, PVD, Gout, GERD  Care Plan : CCM RN- Diabetes Type 2 (Adult)  Updates made by Barrington Ellison, RN since 03/12/2020 12:00 AM    Problem: Glycemic Management (Diabetes, Type 2)   Priority: High    Long-Range Goal: IDDM, HTN, HF, HLD Management Optimized   Start Date: 07/26/2019  Expected End Date: 10/25/2020  This Visit's Progress: On track  Recent Progress: On track  Priority: High  Note:   CARE PLAN ENTRY (see longitudinal plan of care for additional care plan information)  Current Barriers:  . Chronic Disease Management support, education, and care coordination needs related to CHF, CAD, HTN, HLD, and DMII-successful outreach to patient to complete follow up assessment, he says he is doing OK, says he has not pick up the new lancets Dr Daryll Drown ordered for him to help with requiring multiple finger sticks to obtain enough blood to test when checking CBG, reports fasting blood sugar today of 142, denies lows or extremely high blood sugars, says he talked to Dr Daryll Drown about managing his DM during hs appointment on 03/09/20 and she agreed  Clinical Goal(s) related to CHF, CAD, HTN, HLD, and DMII:  Over the next 30 -60 days, patient will:  . Work with the care management team to address educational, disease management, and  care coordination needs  . Begin or continue self health monitoring activities as directed today take medications as directed . Call provider office for new or worsened signs and symptoms Blood glucose findings outside established parameters and New or worsened symptom related to CHF, CAD, HTN, HLD, and DMII . Call care management team with questions or concerns . Verbalize basic understanding of patient centered plan of care established today  Interventions related to CHF, CAD, HTN, HLD, and DMII:  . Evaluation of current treatment plans for HF, CAD, HTN, HLD and DM and patient's adherence to plan as established by provider . Assessed patient understanding of disease states . Appropriate assessments completed . Assessed patient's education and care coordination needs . Assessed self monitored CBGs and reviewed targets . Assessed frequency of hypoglycemia and hyperglycemia and reviewed treatment for both . Reviewed 03/09/20 clinic note and the results of Hgb A1C done 03/09/20 and congratulated patient on the significant improvement without increased incidences of hypoglycemia . Reviewed scheduled/upcoming provider appointments including: for labs and then office visit with Dr Dwyane Dee next month which he says he will cancel  Patient Self Care Activities related to CHF, CAD,  HTN, HLD, and DMII:  . Patient is unable to independently self-manage chronic health conditions . - check blood sugar at prescribed times . - check blood sugar if I feel it is too high or too low . - enter blood sugar readings and medication or insulin into daily log . - take the blood sugar log to all doctor visits . - take the blood sugar meter to all doctor visits . - notify provider of any health concerns       Plan:The care management team will reach out to the patient again over the next 30-60 days.   Kelli Churn RN, CCM, [facility] RN Care Manager [phone]

## 2020-03-13 NOTE — Assessment & Plan Note (Signed)
Reports no recent chest pain.  He is taking atorvastatin, aspirin and metoprolol without issue.   Plan Continue to monitor Continue goal directed therapy

## 2020-03-13 NOTE — Assessment & Plan Note (Signed)
[name] notes that he would like to transfer his DM care to me.  He is taking NPH 20 units daily and regular insulin 10 units BID.  He notes getting a headache after the NPH.  He will try 1000mg  of tylenol and see if this helps.  His A1C today is 7.2 which is very well controlled for him.  I would probably put him at a < 7.5 goal given his age and comorbidities.  His blood pressure is good.  Last LDL was 46.  He is on atorvastatin.  He has a history of diabetic retinopathy, last eye exam in November.  He is on an ARB for renal protection.   Overall, he is well controlled currently, but does not feel he has a good therapeutic relationship with his endocrinologist.  I am comfortable taking over his diabetes management.   Continue current therapy A1C in 3 months Trial of tylenol for headaches

## 2020-03-13 NOTE — Assessment & Plan Note (Signed)
He is doing well on aspirin and cilostozol and a statin.  He has no complaints of leg pain today "as long as he takes his medications."  He has a cardiologist and follows yearly.   Plan Continue statin, aspirin, cilostozol

## 2020-03-13 NOTE — Assessment & Plan Note (Signed)
He is overweight at this time.  We did not discuss very much today given other conversations.  However, I think his DM and weight can be managed with an SGLT2 or a GLP-1.  Will discuss with him at next visit.   Plan Attempt change in DM medications to help with weight as well at next visit.

## 2020-03-13 NOTE — Assessment & Plan Note (Signed)
He notes that he has been having some bloating about once or twice a month.  He is taking his protonix.  He has started taking a daily laxative and notes that the symptoms have improved.

## 2020-03-13 NOTE — Assessment & Plan Note (Signed)
BP today is 120/55.  He is taking his amlodipine and losartan at this time with good results.  He has no complaints today. He has headaches after insulin, but no chest pain.  Denies dizziness or falls.   Plan Continue amlodipine and losartan Check BMET today

## 2020-03-13 NOTE — Assessment & Plan Note (Signed)
This is chronic and controlled.  He is using voltaren gel and gabapentin and has no complaints today.

## 2020-03-13 NOTE — Assessment & Plan Note (Signed)
Currently with no overt edema.  He has a TTE which showed mixed HFrEF and HFpEF.  He has no chest pain.  He is on goal directed therapy with aspirin, statin, beta blocker and furosemide.  He has no swelling, SOB, orthopnea or PND.    Plan Continue goal directed therapy

## 2020-03-13 NOTE — Assessment & Plan Note (Signed)
He has a history of prostate cancer.  He is due for yearly PSA monitoring.  This was ordered today.

## 2020-03-13 NOTE — Assessment & Plan Note (Signed)
He has had no issues with gout.  Will plan to continue allopurinol.

## 2020-03-13 NOTE — Assessment & Plan Note (Signed)
FIT testing ordered.  PSA ordered.

## 2020-03-13 NOTE — Assessment & Plan Note (Signed)
He is well controlled regarding this issue today and has no new complaints.  He is taking oxybutynin and tamsulosin without issue.   Plan Continue current therapy.

## 2020-03-13 NOTE — Assessment & Plan Note (Signed)
LDL at last check was well controlled.   Plan Continue atorvastatin

## 2020-03-13 NOTE — Progress Notes (Signed)
Internal Medicine Clinic Resident  I have personally reviewed this encounter including the documentation in this note and/or discussed this patient with the care management provider. I will address any urgent items identified by the care management provider and will communicate my actions to the patient's PCP. I have reviewed the patient's CCM visit with my supervising attending, Dr Evette Doffing.  Foy [place], MD 03/13/2020

## 2020-03-19 ENCOUNTER — Encounter: Payer: Self-pay | Admitting: Internal Medicine

## 2020-03-26 ENCOUNTER — Ambulatory Visit: Payer: BC Managed Care – PPO | Admitting: *Deleted

## 2020-03-26 DIAGNOSIS — I739 Peripheral vascular disease, unspecified: Secondary | ICD-10-CM

## 2020-03-26 DIAGNOSIS — I5022 Chronic systolic (congestive) heart failure: Secondary | ICD-10-CM

## 2020-03-26 DIAGNOSIS — I10 Essential (primary) hypertension: Secondary | ICD-10-CM

## 2020-03-26 DIAGNOSIS — Z794 Long term (current) use of insulin: Secondary | ICD-10-CM

## 2020-03-26 DIAGNOSIS — E1165 Type 2 diabetes mellitus with hyperglycemia: Secondary | ICD-10-CM

## 2020-03-26 DIAGNOSIS — I2510 Atherosclerotic heart disease of native coronary artery without angina pectoris: Secondary | ICD-10-CM

## 2020-03-26 NOTE — Chronic Care Management (AMB) (Signed)
   03/26/2020  [place] 06/22/1931 [id number]  Patient called this CCM RN requesting help with contacting his pharmacy about a call her received earlier requesting information. Patient says  he is not sure of the information being asked of his as he  has a hard time hearing on the phone and cannot follow the prompts on an automated call.  Called and spoke with pharmacist at [facility] [address]. Pharmacist says there is no documentation in their system that someone for their pharmacy called patient and there are no medications that need to be picked up by the patient. Pharmacist said she will check with pharmacy technicians when they return from lunch and ask them to call patient to clarify if one of them called the patient. Called patient and update provided. Patient expressed appreciation.    Kelli Churn RN, CCM, [facility] RN Care Manager [phone]

## 2020-03-27 ENCOUNTER — Other Ambulatory Visit: Payer: Self-pay | Admitting: Endocrinology

## 2020-03-27 NOTE — Progress Notes (Signed)
Internal Medicine Clinic Attending  CCM services provided by the care management provider and their documentation were discussed with Dr. Court Joy. We reviewed the pertinent findings, urgent action items addressed by the resident and non-urgent items to be addressed by the PCP.  I agree with the assessment, diagnosis, and plan of care documented in the CCM and resident's note.  Axel Filler, MD 03/27/2020

## 2020-03-27 NOTE — Progress Notes (Signed)
Internal Medicine Clinic Resident  I have personally reviewed this encounter including the documentation in this note and/or discussed this patient with the care management provider. I will address any urgent items identified by the care management provider and will communicate my actions to the patient's PCP. I have reviewed the patient's CCM visit with my supervising attending, Dr Evette Doffing.  Lorene Dy, MD 03/27/2020

## 2020-04-09 ENCOUNTER — Ambulatory Visit: Payer: BC Managed Care – PPO | Admitting: *Deleted

## 2020-04-09 DIAGNOSIS — I2510 Atherosclerotic heart disease of native coronary artery without angina pectoris: Secondary | ICD-10-CM

## 2020-04-09 DIAGNOSIS — I739 Peripheral vascular disease, unspecified: Secondary | ICD-10-CM

## 2020-04-09 DIAGNOSIS — I10 Essential (primary) hypertension: Secondary | ICD-10-CM

## 2020-04-09 DIAGNOSIS — E1165 Type 2 diabetes mellitus with hyperglycemia: Secondary | ICD-10-CM

## 2020-04-09 DIAGNOSIS — I5022 Chronic systolic (congestive) heart failure: Secondary | ICD-10-CM

## 2020-04-09 NOTE — Patient Instructions (Signed)
Visit Information It was nice speaking with you today. PATIENT GOALS: Patient Care Plan: CCM RN- Diabetes Type 2 (Adult)    Problem Identified: Glycemic Management (Diabetes, Type 2)   Priority: High    Long-Range Goal: IDDM, HTN, HF, HLD Management Optimized   Start Date: 07/26/2019  Expected End Date: 10/25/2020  Recent Progress: On track  Priority: High  Note:   CARE PLAN ENTRY (see longitudinal plan of care for additional care plan information)  Current Barriers:  . Chronic Disease Management support, education, and care coordination needs related to CHF, CAD, HTN, HLD, and DMII-successful outreach to patient to complete follow up assessment, he says he is doing OK, says he has not pick up the new lancets Dr Daryll Drown ordered for him to help with requiring multiple finger sticks to obtain enough blood because he is now getting enough blood when he checks his CBG by holding his hands under warm water and milking his finger before, he says he needs to make a clinic appointment because he is "making water" all the time and it is disrupting his sleep and quality of life, he also says he's been having more heartburn and the pills he takes is no longer working. He says his blood pressure and blood sugar are "about the same", acknowledges he will make a clinic appointment to follow up on c/o urinary frequency and heartburn and cancel his upcoming appointments with [facility] since Dr Daryll Drown is now management his diabetes.   Clinical Goal(s) related to CHF, CAD, HTN, HLD, and DMII:  Over the next 30 -60 days, patient will:  . Work with the care management team to address educational, disease management, and care coordination needs  . Begin or continue self health monitoring activities as directed today take medications as directed . Call provider office for new or worsened signs and symptoms Blood glucose findings outside established parameters and New or worsened symptom related to CHF,  CAD, HTN, HLD, and DMII . Call care management team with questions or concerns . Verbalize basic understanding of patient centered plan of care established today  Interventions related to CHF, CAD, HTN, HLD, and DMII:  . Evaluation of current treatment plans for HF, CAD, HTN, HLD and DM and patient's adherence to plan as established by provider . Assessed patient understanding of disease states . Appropriate assessments completed . Assessed patient's education and care coordination needs . Assessed self monitored CBGs and reviewed targets . Assessed frequency of hypoglycemia and hyperglycemia and reviewed treatment for both . Encouraged patient to make acute clinic appointment to address his concerns r/t urinary frequency and heartburn ; ensured he has clinic phone number . Reviewed scheduled/upcoming provider appointments including:  endocrinology  for labs and office visit with Dr Dwyane Dee which patient needs to cancel   Patient Self Care Activities related to CHF, CAD, HTN, HLD, and DMII:  . Patient is unable to independently self-manage chronic health conditions . - check blood sugar at prescribed times . - check blood sugar if I feel it is too high or too low . - enter blood sugar readings and medication or insulin into daily log . - take the blood sugar log to all doctor visits . - take the blood sugar meter to all doctor visits . - notify provider of any health concerns       The patient verbalized understanding of instructions, educational materials, and care plan provided today and declined offer to receive copy of patient instructions, educational materials, and care  plan.   The care management team will reach out to the patient again over the next 30-60 days.   Kelli Churn RN, CCM, [facility] RN Care Manager [phone]

## 2020-04-09 NOTE — Chronic Care Management (AMB) (Signed)
Chronic Care Management   CCM RN Visit Note  04/09/2020 Name: [name] [name] MRN: [id number] DOB: 09/09/1931  Subjective: [name] [name] is a 87 y.o. year old male who is a primary care patient of [name], MD. The care management team was consulted for assistance with disease management and care coordination needs.    Engaged with patient by telephone for follow up visit in response to provider referral for case management and/or care coordination services.   Consent to Services:  The patient was given information about Chronic Care Management services, agreed to services, and gave verbal consent prior to initiation of services.  Please see initial visit note for detailed documentation.   Patient agreed to services and verbal consent obtained.   Assessment: Review of patient past medical history, allergies, medications, health status, including review of consultants reports, laboratory and other test data, was performed as part of comprehensive evaluation and provision of chronic care management services.   SDOH (Social Determinants of Health) assessments and interventions performed:    CCM Care Plan  No Known Allergies  Outpatient Encounter Medications as of 04/09/2020  Medication Sig  . cilostazol (PLETAL) 50 MG tablet Take 1 tablet by mouth twice daily  . allopurinol (ZYLOPRIM) 100 MG tablet Take 2 tablets by mouth once daily  . amLODipine (NORVASC) 10 MG tablet Take 1 tablet by mouth once daily  . aspirin EC 81 MG tablet Take 1 tablet (81 mg total) by mouth daily.  [name] atorvastatin (LIPITOR) 40 MG tablet Take 1 tablet by mouth once daily  . Blood Glucose Monitoring Suppl (ONETOUCH VERIO FLEX SYSTEM) w/Device KIT Use OneTouch Verio Flex meter to check blood sugar twice daily.  . colchicine 0.6 MG tablet Take 1 tablet by mouth twice daily  . diclofenac Sodium (VOLTAREN) 1 % GEL Apply 2 g topically 4 (four) times daily as needed (back pain).  . EQ LORATADINE 10 MG tablet  TAKE 1 TABLET BY MOUTH DAILY AS NEEDED FOR RHINITIS.  . furosemide (LASIX) 20 MG tablet Take 1 tablet (20 mg total) by mouth daily.  [name] gabapentin (NEURONTIN) 100 MG capsule Take 1 capsule (100 mg total) by mouth 3 (three) times daily.  . insulin NPH Human (HUMULIN N,NOVOLIN N) 100 UNIT/ML injection Inject 20 Units into the skin daily before breakfast. Per Dr [name] OV note of 11/17/19  . Insulin Regular Human (NOVOLIN R RELION IJ) Inject 10 Units as directed 2 (two) times daily with a meal. Before  breakfast and supper Per Dr [name] OV of 11/17/19  . Insulin Syringe-Needle U-100 31G X 15/64" 0.3 ML MISC Use to inject insulin 3 times a day  . losartan (COZAAR) 50 MG tablet Take 1 tablet by mouth once daily  . metFORMIN (GLUCOPHAGE-XR) 500 MG 24 hr tablet TAKE 2 TABLETS BY MOUTH ONCE DAILY WITH SUPPER  . metoprolol tartrate (LOPRESSOR) 25 MG tablet Take 1 tablet by mouth twice daily  . OneTouch Delica Lancets 44Y MISC 1 each by Does not apply route 2 (two) times daily. E 11.9 diagnosis  . ONETOUCH VERIO test strip USE AS DIRECTED TO CHECK BLOOD SUGAR TWICE DAILY  . oxybutynin (DITROPAN) 5 MG tablet Take 1 tablet (5 mg total) by mouth 3 (three) times daily.  . pantoprazole (PROTONIX) 40 MG tablet Take 1 tablet (40 mg total) by mouth daily.  . tamsulosin (FLOMAX) 0.4 MG CAPS capsule Take 1 capsule (0.4 mg total) by mouth daily.   No facility-administered encounter medications on file as of 04/09/2020.  Patient Active Problem List   Diagnosis Date Noted  . Chronic gout 05/27/2018  . Type 2 diabetes mellitus with mild 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, bilateral ([place]) 03/24/2018  . Type 2 diabetes mellitus with microalbuminuria, with long-term current use of insulin ([place]) 11/13/2017  . Healthcare maintenance 11/13/2017  . Chronic left shoulder pain 11/13/2017  . Severe obesity (BMI 35.0-35.9 with comorbidity) ([place]) 11/13/2017  . Urethral stricture in male 11/13/2017   . Benign prostatic hyperplasia (BPH) with urinary urge incontinence 11/13/2017  . Chronic systolic heart failure ([place]) 10/31/2016  . Arthritis 12/19/2014  . Coronary artery disease involving native coronary artery of native heart without angina pectoris 09/30/2007  . Peripheral arterial disease ([place]) 09/30/2007  . Chronic non-seasonal allergic rhinitis 02/17/2006  . History of prostate cancer 02/17/2006  . Type 2 diabetes mellitus with peripheral neuropathy ([place]) 11/04/2005  . Hyperlipidemia 11/04/2005  . Essential hypertension 11/04/2005  . Gastroesophageal reflux disease with hiatal hernia 11/04/2005    Conditions to be addressed/monitored:IDDM, HTN, CAD, HF, HLD, PVD, Gout, GERD  Care Plan : CCM RN- Diabetes Type 2 (Adult)  Updates made by Barrington Ellison, RN since 04/09/2020 12:00 AM    Problem: Glycemic Management (Diabetes, Type 2)   Priority: High    Long-Range Goal: IDDM, HTN, HF, HLD Management Optimized   Start Date: 07/26/2019  Expected End Date: 10/25/2020  Recent Progress: On track  Priority: High  Note:   CARE PLAN ENTRY (see longitudinal plan of care for additional care plan information)  Current Barriers:  . Chronic Disease Management support, education, and care coordination needs related to CHF, CAD, HTN, HLD, and DMII-successful outreach to patient to complete follow up assessment, he says he is doing OK, says he has not pick up the new lancets Dr Daryll Drown ordered for him to help with requiring multiple finger sticks to obtain enough blood because he is now getting enough blood when he checks his CBG by holding his hands under warm water and milking his finger before, he says he needs to make a clinic appointment because he is "making water" all the time and it is disrupting his sleep and quality of life, he also says he's been having more heartburn and the pills he takes is no longer working. He says his blood pressure and blood sugar are "about the same", acknowledges  he will make a clinic appointment to follow up on c/o urinary frequency and heartburn and cancel his upcoming appointments with [facility] since Dr Daryll Drown is now management his diabetes.   Clinical Goal(s) related to CHF, CAD, HTN, HLD, and DMII:  Over the next 30 -60 days, patient will:  . Work with the care management team to address educational, disease management, and care coordination needs  . Begin or continue self health monitoring activities as directed today take medications as directed . Call provider office for new or worsened signs and symptoms Blood glucose findings outside established parameters and New or worsened symptom related to CHF, CAD, HTN, HLD, and DMII . Call care management team with questions or concerns . Verbalize basic understanding of patient centered plan of care established today  Interventions related to CHF, CAD, HTN, HLD, and DMII:  . Evaluation of current treatment plans for HF, CAD, HTN, HLD and DM and patient's adherence to plan as established by provider . Assessed patient understanding of disease states . Appropriate assessments completed . Assessed patient's education and care coordination needs . Assessed self monitored CBGs and reviewed targets .  Assessed frequency of hypoglycemia and hyperglycemia and reviewed treatment for both . Encouraged patient to make acute clinic appointment to address his concerns r/t urinary frequency and heartburn ; ensured he has clinic phone number . Reviewed scheduled/upcoming provider appointments including: [facility]  for labs and office visit with Dr Dwyane Dee which patient needs to cancel   Patient Self Care Activities related to CHF, CAD, HTN, HLD, and DMII:  . Patient is unable to independently self-manage chronic health conditions . - check blood sugar at prescribed times . - check blood sugar if I feel it is too high or too low . - enter blood sugar readings and medication or insulin into  daily log . - take the blood sugar log to all doctor visits . - take the blood sugar meter to all doctor visits . - notify provider of any health concerns       Plan:The care management team will reach out to the patient again over the next 30-60 days.   Kelli Churn RN, CCM, [facility] RN Care Manager [phone]

## 2020-04-10 ENCOUNTER — Other Ambulatory Visit: Payer: Self-pay | Admitting: Internal Medicine

## 2020-04-10 ENCOUNTER — Other Ambulatory Visit: Payer: Self-pay | Admitting: Endocrinology

## 2020-04-12 ENCOUNTER — Other Ambulatory Visit: Payer: Self-pay | Admitting: Internal Medicine

## 2020-04-12 DIAGNOSIS — N3941 Urge incontinence: Secondary | ICD-10-CM

## 2020-04-12 NOTE — Progress Notes (Signed)
Internal Medicine Clinic Resident  I have personally reviewed this encounter including the documentation in this note and/or discussed this patient with the care management provider. I will address any urgent items identified by the care management provider and will communicate my actions to the patient's PCP. I have reviewed the patient's CCM visit with my supervising attending, Dr Dareen Piano.  Mitzi Hansen, MD Internal Medicine Resident PGY-2 [facility] Pager: [phone] 04/12/2020 4:09 PM

## 2020-04-16 NOTE — Progress Notes (Signed)
Internal Medicine Clinic Attending  CCM services provided by the care management provider and their documentation were discussed with Dr. Christian. We reviewed the pertinent findings, urgent action items addressed by the resident and non-urgent items to be addressed by the PCP.  I agree with the assessment, diagnosis, and plan of care documented in the CCM and resident's note.  Saher Davee, MD 04/16/2020  

## 2020-04-17 ENCOUNTER — Other Ambulatory Visit: Payer: Medicare (Managed Care)

## 2020-04-18 ENCOUNTER — Other Ambulatory Visit: Payer: Self-pay

## 2020-04-18 ENCOUNTER — Ambulatory Visit (INDEPENDENT_AMBULATORY_CARE_PROVIDER_SITE_OTHER): Payer: BC Managed Care – PPO | Admitting: Internal Medicine

## 2020-04-18 ENCOUNTER — Encounter: Payer: Self-pay | Admitting: Internal Medicine

## 2020-04-18 DIAGNOSIS — E669 Obesity, unspecified: Secondary | ICD-10-CM | POA: Diagnosis not present

## 2020-04-18 DIAGNOSIS — K219 Gastro-esophageal reflux disease without esophagitis: Secondary | ICD-10-CM

## 2020-04-18 DIAGNOSIS — K5900 Constipation, unspecified: Secondary | ICD-10-CM

## 2020-04-18 DIAGNOSIS — K449 Diaphragmatic hernia without obstruction or gangrene: Secondary | ICD-10-CM

## 2020-04-18 MED ORDER — PANTOPRAZOLE SODIUM 40 MG PO TBEC: 40.0000 mg | DELAYED_RELEASE_TABLET | Freq: Every day | ORAL | 3 refills | Status: DC

## 2020-04-18 MED ORDER — POLYETHYLENE GLYCOL 3350 17 G PO PACK: 17.0000 g | PACK | Freq: Two times a day (BID) | ORAL | 3 refills | Status: DC

## 2020-04-18 MED ORDER — SENNA 8.6 MG PO TABS: 1.0000 | ORAL_TABLET | Freq: Every evening | ORAL | 0 refills | Status: DC

## 2020-04-18 NOTE — Progress Notes (Signed)
   CC: Constipation   HPI:  [name] [name] is a 87 y.o. M/F, with a PMH noted below, who presents to the clinic for constipation. To see the management of their acute and chronic conditions, please see the A&P note under the Encounters tab.   Past Medical History:  Diagnosis Date  . Benign prostatic hyperplasia (BPH) with urinary urge incontinence 11/13/2017  . Chronic left shoulder pain 11/13/2017  . Chronic non-seasonal allergic rhinitis 02/17/2006  . Chronic systolic heart failure ([place]) 10/31/2016   Echo (05/03/2015): LVEF 78-67%, grade 2 diastolic dysfunction  . 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 Essential hypertension 11/04/2005  . Gastroesophageal reflux disease with hiatal hernia 11/04/2005  . History of prostate cancer 02/17/2006   Diagnosed 1995, s/p seed implantation in the late 1990's, PSA undetectable 04/10/2016  . Hyperlipidemia 11/04/2005  . Obesity (BMI 30.0-34.9) 11/13/2017  . Peripheral arterial disease ([place]) 09/30/2007   with claudication, failed attempt at LLE PTCA  . Type 2 diabetes mellitus with microalbuminuria, with long-term current use of insulin ([place]) 11/13/2017  . Type 2 diabetes mellitus with mild 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, bilateral ([place]) 03/24/2018  . Type 2 diabetes mellitus with peripheral neuropathy ([place]) 11/04/2005  . Urethral stricture in male 11/13/2017   Noted on cystoscopy 12/18/2014.  Reportedly asked to do chronic intermittent self catheterizations, but has not.   Review of Systems:   Review of Systems  Constitutional: Negative for chills, fever, malaise/fatigue and weight loss.  Cardiovascular: Negative for chest pain and palpitations.   Gastrointestinal: Positive for constipation. Negative for abdominal pain, blood in stool, diarrhea, nausea and vomiting.  Genitourinary: Negative for frequency, hematuria and urgency.  Neurological: Negative for dizziness and headaches.     Physical Exam:  Vitals:   04/18/20 1502 04/18/20 1514  BP: (!) 168/73 118/65  Pulse: 78 68  Temp: 97.9 F (36.6 C)   TempSrc: Oral   SpO2: 100%   Weight: 275 lb 14.4 oz (125.1 kg)   Height: 6\' 1"  (1.854 m)    Physical Exam Constitutional:      General: He is not in acute distress.    Appearance: He is obese. He is not ill-appearing or toxic-appearing.  Cardiovascular:     Rate and Rhythm: Normal rate and regular rhythm.     Pulses: Normal pulses.     Heart sounds: Normal heart sounds. No murmur heard. No friction rub. No gallop.   Pulmonary:     Effort: Pulmonary effort is normal.     Breath sounds: Normal breath sounds. No wheezing, rhonchi or rales.  Abdominal:     General: Bowel sounds are normal. There is no distension.     Palpations: Abdomen is soft. There is no mass.     Tenderness: There is no abdominal tenderness. There is no guarding.  Neurological:     Mental Status: He is alert and oriented to person, place, and time.      Assessment & Plan:   See Encounters Tab for problem based charting.  Patient discussed with Dr. Rebeca Alert

## 2020-04-18 NOTE — Patient Instructions (Signed)
To Mr. Corinna Lines,  It was a pleasure meeting you today. We spoke about your constipation today. I would like for use to start you on Miralax. Please take one packet of Miralax two times each day. Additionally, we will also start Senna, which you should take at night. Please continue to take these medications until you have a bowel movement. If you are still not having a bowel movement after several days, please call the clinic.  Have a good day,  Maudie Mercury, MD

## 2020-04-19 ENCOUNTER — Encounter: Payer: Self-pay | Admitting: Internal Medicine

## 2020-04-19 ENCOUNTER — Ambulatory Visit: Payer: Medicare (Managed Care) | Admitting: Endocrinology

## 2020-04-19 NOTE — Assessment & Plan Note (Signed)
Patient presents to the clinic with complaints of of constipation. He has not had a bowel movement since Saturday. He attempted castor oil, but it only exacerbated his "stomach acid." He states that it is likely due to one of his medications because he talked with another doctor about it at a previous OV.   A/P:  Patient presents with repeat complaint of constipation, which is likely due to his anticholinergic medications. He was unsuccessful with castor oil. He has been placed on senna and miralax in the past which has relieved his constipation. Discussed restarting these medications with the patient and stressed the importance of continuing to take his medications.  - Miralax 17g twice daily.  - Senna 8.6 mg at night

## 2020-04-19 NOTE — Assessment & Plan Note (Signed)
Patient states that he did not know he was taking medications for his "stomach acid." We discussed that he was started on the medication by his PCP for his bloating and other symptoms. He states he is unsure if he has the medication or not. Will refill today.  - Refill Protonix 40 mg daily

## 2020-04-24 ENCOUNTER — Other Ambulatory Visit: Payer: Self-pay

## 2020-04-24 ENCOUNTER — Encounter: Payer: Self-pay | Admitting: Student

## 2020-04-24 ENCOUNTER — Ambulatory Visit (INDEPENDENT_AMBULATORY_CARE_PROVIDER_SITE_OTHER): Payer: BC Managed Care – PPO | Admitting: Student

## 2020-04-24 VITALS — BP 124/77 | HR 73 | Temp 98.1°F | Ht 72.0 in | Wt 274.7 lb

## 2020-04-24 DIAGNOSIS — K5900 Constipation, unspecified: Secondary | ICD-10-CM

## 2020-04-24 DIAGNOSIS — R1319 Other dysphagia: Secondary | ICD-10-CM | POA: Diagnosis not present

## 2020-04-24 DIAGNOSIS — Z0000 Encounter for general adult medical examination without abnormal findings: Secondary | ICD-10-CM | POA: Diagnosis not present

## 2020-04-24 MED ORDER — SENNOSIDES-DOCUSATE SODIUM 8.6-50 MG PO TABS: 1.0000 | ORAL_TABLET | Freq: Every day | ORAL | 0 refills | Status: DC

## 2020-04-24 NOTE — Assessment & Plan Note (Signed)
Patient states he would like for someone to go over each of his medications so that he can better understand what he is taking. He uses a pill box to organize his medications and manages them on his own, however he feels overwhelmed at times with the number of medications he takes.  Plan: - referral to Dr. Georgina Peer, clinical pharmacist for med rec and counseling

## 2020-04-24 NOTE — Patient Instructions (Addendum)
[name] [name],   Thank you for your visit to the [facility] today. It was a pleasure meeting you. Today we discussed the following:  1) Constipation - Please continue taking the Miralax. - I would like you to START Senokot-S (sent to your pharmacy) and STOP senna. You can start by taking 1 tablet nightly and then increase to 2 tablets nightly, then up to 2 tablets morning and evening, with a goal of having 1 bowel movement daily. - Be sure you are staying hydrating - I am checking your thyroid function today with a TSH. I will notify you of the result. - I have placed a referral to gastroenterology (GI specialist) for further evaluation and possible colonoscopy  2) Food getting stuck - As mentioned above, I have placed a referral to gastroenterology for further evaluation - Continue taking your Protonix daily  3) Medication education - I have placed a referral to our clinical pharmacist, Dr. Georgina Peer, for you to meet with to go through all of your medications with you.   Please schedule an appointment with Dr. Georgina Peer when you check out. You are currently scheduled to see Dr. Marianna Payment here at [facility] on 4/18 and Dr. Daryll Drown on 5/12. Please bring all of your medications with you.   If you have any questions or concerns, please call our clinic at [phone] between 9am-5pm. Outside of these hours, call [phone] and ask for the internal medicine resident on call. If you feel you are having a medical emergency please call 911.

## 2020-04-24 NOTE — Assessment & Plan Note (Addendum)
[name] [name] returns to clinic with report of persistent constipation. He was seen in clinic 5 days ago at which time he reported he had not had a bowel movement in 5 days. He was instructed to restart the bowel regimen he was previously prescribed, Miralax 17g twice daily and senna 8.6 mg at night, and he reports he took these medications as well as castor oil. He reports he subsequently had a BM 2 days later, or 3 days ago. Notes the BM was loose but denies blood or melena. Also endorses abdominal bloating but denies associated abdominal pain. [name] [name] states on average he has ~2 BM/week. He reports a history of colonoscopy, and on review of his chart it appears his last was at [facility] on 10/16/2004. He was instructed to repeat colonoscopy in 10 years.   Assessment/Plan: Patient presents with ongoing report of constipation associated with bloating. This appears to be an acute on chronic issue. Will continue Miralax, switch senna to Senokot-S, obtain TSH. Will also refer to GI (see problem-based A&P for esophageal dysphagia). - continue Miralax 17 g twice daily - Switch senna to Senokot-S 1 tablet nightly; instructed patient to titrate up with goal of 1 BM daily - referral to GI for further evaluation; patient also due for colonoscopy - TSH --> within normal limits at 2.820

## 2020-04-24 NOTE — Assessment & Plan Note (Signed)
[name] [name] has a history of GERD for which he takes Protonix 40 mg daily. Presents today for ongoing constipation and also reports he has been having a sensation of food "getting stuck" in his throat. Denies burning or reflux. No blood in his stool or melena, no anemia, no odynophagia. History of hiatal hernia mentioned in problem list however cannot find documentation.   A/P: Symptoms concerning for esophageal dysphagia with solids. Given his history of GERD, will refer to GI for further evaluation and possible EGD. - Referral to GI - Patient instructed to continue Protonix 40 mg daily

## 2020-04-24 NOTE — Progress Notes (Signed)
   CC: constipation, "food getting stuck"  HPI:  [name] is a 87 y.o. man with history as below who presents to clinic for follow-up of constipation and also report of food getting stuck in his throat. His last clinic visit was on 04/18/20 with Dr. Gilford [name].   To see the details of this patient's management of their acute and chronic problems, please refer to the Assessment & Plan under the Encounters tab.    Past Medical History:  Diagnosis Date  . Benign prostatic hyperplasia (BPH) with urinary urge incontinence 11/13/2017  . Chronic left shoulder pain 11/13/2017  . Chronic non-seasonal allergic rhinitis 02/17/2006  . Chronic systolic heart failure ([place]) 10/31/2016   Echo (05/03/2015): LVEF 96-75%, grade 2 diastolic dysfunction  . 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 Essential hypertension 11/04/2005  . Gastroesophageal reflux disease with hiatal hernia 11/04/2005  . History of prostate cancer 02/17/2006   Diagnosed 1995, s/p seed implantation in the late 1990's, PSA undetectable 04/10/2016  . Hyperlipidemia 11/04/2005  . Obesity (BMI 30.0-34.9) 11/13/2017  . Peripheral arterial disease ([place]) 09/30/2007   with claudication, failed attempt at LLE PTCA  . Type 2 diabetes mellitus with microalbuminuria, with long-term current use of insulin ([place]) 11/13/2017  . Type 2 diabetes mellitus with mild 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, bilateral ([place]) 03/24/2018  . Type 2 diabetes mellitus with peripheral neuropathy ([place]) 11/04/2005  . Urethral stricture in male 11/13/2017   Noted on cystoscopy 12/18/2014.  Reportedly asked to do chronic intermittent self catheterizations, but has not.   Review of  Systems:    Review of Systems  Constitutional: Negative for chills, fever, malaise/fatigue and weight loss.  Gastrointestinal: Positive for constipation. Negative for abdominal pain, blood in stool, diarrhea, heartburn, melena, nausea and vomiting.  Genitourinary: Negative for dysuria.  Neurological: Negative for dizziness, weakness and headaches.    Physical Exam:  Vitals:   04/24/20 1341 04/24/20 1344  BP: (!) 148/67 124/77  Pulse: 69 73  Temp: 98.1 F (36.7 C)   TempSrc: Oral   SpO2: 100%   Weight: 274 lb 11.2 oz (124.6 kg)   Height: 6' (1.829 m)    Constitutional: well-appearing man sitting in chair, in no acute distress, appears younger than stated age HENT: normocephalic atraumatic, mucous membranes moist Eyes: conjunctiva non-erythematous Neck: supple Cardiovascular: regular rate and rhythm, no m/r/g; no chest wall tenderness to palpation Pulmonary/Chest: normal work of breathing on room air, lungs clear to auscultation bilaterally Abdominal: non-tender; distended but soft; no fluid wave; BS+ MSK: normal bulk and tone Neurological: alert & oriented x 3, normal gait Skin: warm and dry Psych: normal mood and affect  Assessment & Plan:   See Encounters Tab for problem-based charting.  Patient discussed with Dr. Philipp [name]

## 2020-04-25 LAB — TSH: TSH: 2.82 u[IU]/mL (ref 0.450–4.500)

## 2020-04-25 NOTE — Progress Notes (Signed)
Internal Medicine Clinic Attending  Case discussed with Dr. Watson  At the time of the visit.  We reviewed the resident's history and exam and pertinent patient test results.  I agree with the assessment, diagnosis, and plan of care documented in the resident's note.  

## 2020-05-02 ENCOUNTER — Other Ambulatory Visit: Payer: Self-pay | Admitting: Cardiovascular Disease

## 2020-05-03 ENCOUNTER — Other Ambulatory Visit: Payer: Self-pay | Admitting: Physician Assistant

## 2020-05-03 DIAGNOSIS — R109 Unspecified abdominal pain: Secondary | ICD-10-CM

## 2020-05-03 DIAGNOSIS — K5900 Constipation, unspecified: Secondary | ICD-10-CM

## 2020-05-03 DIAGNOSIS — R1310 Dysphagia, unspecified: Secondary | ICD-10-CM

## 2020-05-07 ENCOUNTER — Telehealth: Payer: Self-pay

## 2020-05-07 NOTE — Telephone Encounter (Signed)
Pls contact pt [phone]; Urgent

## 2020-05-07 NOTE — Telephone Encounter (Signed)
Returned call to patient. States he was told by GI that he needs to see his doctor because he has a "kidney problem." Patient already scheduled to see Dr. Georgina Peer on 05/10/2020. Appt given with Dr. Marianna Payment for same day at [facility]. Patient will bring all meds to these appts. Patient already had appt on 05/21/2020 with Dr. Marianna Payment. This will need to be cancelled if he does not need a f/u at that time.  Per chart review, patient saw [facility]. Call placed to them for records of last OV. Left detailed message on their Medical Records line asking for OV notes to be faxed to [facility].

## 2020-05-07 NOTE — Telephone Encounter (Signed)
Great.  Thanks

## 2020-05-08 ENCOUNTER — Ambulatory Visit
Admission: RE | Admit: 2020-05-08 | Discharge: 2020-05-08 | Disposition: A | Discharge status: Home / Self Care | Payer: BC Managed Care – PPO | Source: Ambulatory Visit | Attending: Physician Assistant | Admitting: Physician Assistant

## 2020-05-08 DIAGNOSIS — R1310 Dysphagia, unspecified: Secondary | ICD-10-CM

## 2020-05-10 ENCOUNTER — Encounter: Payer: BC Managed Care – PPO | Admitting: Pharmacist

## 2020-05-10 ENCOUNTER — Ambulatory Visit (INDEPENDENT_AMBULATORY_CARE_PROVIDER_SITE_OTHER): Payer: BC Managed Care – PPO | Admitting: Student

## 2020-05-10 ENCOUNTER — Other Ambulatory Visit: Payer: Self-pay

## 2020-05-10 DIAGNOSIS — R1319 Other dysphagia: Secondary | ICD-10-CM | POA: Diagnosis not present

## 2020-05-10 DIAGNOSIS — K5900 Constipation, unspecified: Secondary | ICD-10-CM

## 2020-05-10 DIAGNOSIS — Z7189 Other specified counseling: Secondary | ICD-10-CM

## 2020-05-10 MED ORDER — SENNOSIDES-DOCUSATE SODIUM 8.6-50 MG PO TABS: 2.0000 | ORAL_TABLET | Freq: Two times a day (BID) | ORAL | 0 refills | Status: DC

## 2020-05-10 MED ORDER — POLYETHYLENE GLYCOL 3350 17 G PO PACK: 17.0000 g | PACK | Freq: Two times a day (BID) | ORAL | 3 refills | Status: DC

## 2020-05-10 NOTE — Assessment & Plan Note (Signed)
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 He will follow up with [facility] for this since it will require specialized interventions (possible manometry, etc).

## 2020-05-10 NOTE — Progress Notes (Deleted)
   Subjective/Objective:    Patient ID: [name] [name], male    DOB: [birth year], 87 y.o.   MRN: [id number]  HPI  Patient is a 87 y.o. male who presents for medication review and management.  He is in good spirits and presents {w-w/o:315700} assistance. Patient was referred on 04/24/20 and last seen by Primary Ce Provider 03/09/20.  Medication Adherence Questionnaire (A score of 2 or more points indicates risk for nonadherence)  Do you know what each of your medicines is for?  (1 point if no)  Do you ever have trouble remembering to take your medicine? *** (2 points if yes)  Do you ever not take a medicine because you feel you do not need it?  *** (1 point if yes)  Do you think that any of your medicines is not helping you? *** (1 point if yes)  Do you have any physical problems such as vision loss that keep you from taking your medicines as prescribed?  *** (2 points if yes)  Do you think any of your medicine is causing a side effect?  *** (1 point if yes)  Do you know the names of ALL of your medicines? *** (1 point if no)  Do you think that you need ALL of your medicines? *** (1 point if no)  In the past 6 months, have you missed getting a refill or a new prescription filled on time?  *** (1 point if yes)  How often do you miss taking a dose of medicine?  *** Never (0 points), 1 or 2 times a month (1 points), 1 time a week (2 points), 2 or more times a week (3 points).   TOTAL SCORE ***/14    Assessment/Plan:   Understanding of regimen: {excellent/good/fair/poor:19665}  Understanding of indications: {excellent/good/fair/poor:19665}  Potential of compliance: {excellent/good/fair/poor:19665}  Patient has {MISC; ADHERENCE:18335} based on score of *** for questionnaire. Barriers include: ***lack of knowledge, forgetfulness, lack of belief in necessity of treatment, concern for more harm than good, physical barriers such as vision loss, cost. Medication list reviewed and updated. The  following issues were noted ***. Patient was provided with a printed medication list.   Follow-up appointment ***. Written patient instructions provided.  This appointment required *** minutes of patient care (this includes precharting, chart review, review of results, and face-to-face care).  Thank you for involving pharmacy to assist in providing this patient's care.  Patient seen with ***

## 2020-05-10 NOTE — Patient Instructions (Signed)
[name] [name],  It was a pleasure seeing you in the clinic today.   1. Please take Miralax twice daily (morning and night), you can get this from any pharmacy. Also take Senokot-S, 2 tablets twice daily (morning and night). These two medications will help make your bowel movements more regular.  2. Please go get the imaging studies done next week (on 05/18/2020). Also, follow up with the GI doctors [facility]).  Please call our clinic at [phone] if you have any questions or concerns. The best time to call is Monday-Friday from 9am-4pm, but there is someone available 24/7 at the same number. If you need medication refills, please notify your pharmacy one week in advance and they will send [place] a request.   Thank you for letting us take part in your care. We look forward to seeing you next time!

## 2020-05-10 NOTE — Progress Notes (Signed)
CC: f/u of constipation and labs from GI visit  HPI:  [name] [name] is a 87 y.o. male with history listed below presenting to the [facility] for f/u of constipation and to find out results from recent GI visit. Please see individualized problem based charting for full HPI.  Past Medical History:  Diagnosis Date  . Benign prostatic hyperplasia (BPH) with urinary urge incontinence 11/13/2017  . Chronic left shoulder pain 11/13/2017  . Chronic non-seasonal allergic rhinitis 02/17/2006  . Chronic systolic heart failure ([place]) 10/31/2016   Echo (05/03/2015): LVEF 62-95%, grade 2 diastolic dysfunction  . 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 Essential hypertension 11/04/2005  . Gastroesophageal reflux disease with hiatal hernia 11/04/2005  . History of prostate cancer 02/17/2006   Diagnosed 1995, s/p seed implantation in the late 1990's, PSA undetectable 04/10/2016  . Hyperlipidemia 11/04/2005  . Obesity (BMI 30.0-34.9) 11/13/2017  . Peripheral arterial disease ([place]) 09/30/2007   with claudication, failed attempt at LLE PTCA  . Type 2 diabetes mellitus with microalbuminuria, with long-term current use of insulin ([place]) 11/13/2017  . Type 2 diabetes mellitus with mild 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, bilateral ([place]) 03/24/2018  . Type 2 diabetes mellitus with peripheral neuropathy ([place]) 11/04/2005  . Urethral stricture in male 11/13/2017   Noted on cystoscopy 12/18/2014.  Reportedly asked to do chronic intermittent self catheterizations, but has not.    Review of Systems:  Negative aside from that listed in individualized problem based charting.  Physical Exam:  Vitals:   05/10/20 0925 05/10/20 0933   BP: (!) 137/119 116/63  Pulse: 78   Temp: 98.7 F (37.1 C)   TempSrc: Oral   SpO2: 100%   Weight: 274 lb (124.3 kg)   Height: 6' (1.829 m)    Physical Exam Constitutional:      Appearance: He is obese. He is not ill-appearing.  HENT:     Head: Normocephalic and atraumatic.     Nose: Nose normal. No congestion.     Mouth/Throat:     Mouth: Mucous membranes are moist.     Pharynx: Oropharynx is clear. No oropharyngeal exudate.  Eyes:     Extraocular Movements: Extraocular movements intact.     Conjunctiva/sclera: Conjunctivae normal.     Pupils: Pupils are equal, round, and reactive to light.  Cardiovascular:     Rate and Rhythm: Normal rate and regular rhythm.     Pulses: Normal pulses.     Heart sounds: Normal heart sounds. No murmur heard. No friction rub. No gallop.   Pulmonary:     Effort: Pulmonary effort is normal.     Breath sounds: Normal breath sounds. No wheezing, rhonchi or rales.  Abdominal:     Palpations: Abdomen is soft.     Tenderness: There is no abdominal tenderness. There is no guarding or rebound.     Comments: Mildly distended, hypoactive bowel sounds.  Musculoskeletal:        General: Normal range of motion.     Cervical back: Normal range of motion.  Skin:    General: Skin is warm and dry.  Neurological:     General: No focal deficit present.     Mental Status: He is alert and oriented to person, place, and time.  Psychiatric:  Mood and Affect: Mood normal.        Behavior: Behavior normal.      Assessment & Plan:   See Encounters Tab for problem based charting.  Patient discussed with Dr. Evette Doffing

## 2020-05-10 NOTE — Progress Notes (Signed)
Created in error. Patient was not seen.

## 2020-05-10 NOTE — Assessment & Plan Note (Signed)
Patient reporting continued constipation with irregular bowel movements (once every other day or so) despite taking Miralax BID and senokot-S daily. Abdomen is mildly distended and hypoactive bowel sounds observed on exam. He is schedule for CT abdomen/pelvis on 05/18/2020 as per [facility]. Increased senokot-S to 2 tablets BID and will continue scheduled miralax BID.   Plan: -miralax BID -senokot-S 2 tablets BID -CT abdomen/pelvis as per [facility] -f/u in 1 month to reassess constipation with new bowel regimen

## 2020-05-11 ENCOUNTER — Telehealth: Payer: BC Managed Care – PPO

## 2020-05-11 ENCOUNTER — Telehealth: Payer: Self-pay | Admitting: *Deleted

## 2020-05-11 NOTE — Progress Notes (Signed)
Internal Medicine Clinic Attending  Case discussed with Dr. Winters at the time of the visit.  We reviewed the resident's history and exam and pertinent patient test results.  I agree with the assessment, diagnosis, and plan of care documented in the resident's note.  Evertte Sones, M.D., Ph.D.  

## 2020-05-11 NOTE — Progress Notes (Signed)
Internal Medicine Clinic Attending  Case discussed with Dr. Jinwala  At the time of the visit.  We reviewed the resident's history and exam and pertinent patient test results.  I agree with the assessment, diagnosis, and plan of care documented in the resident's note.  

## 2020-05-11 NOTE — Telephone Encounter (Signed)
  Care Management   Outreach Note  05/11/2020 Name: [name] [name] MRN: [id number] DOB: 1931-12-23  Referred by: [name], MD Reason for referral : Care Coordination ( IDDM, HTN, CAD, HF, HLD, PVD, Gout, GERD)   An unsuccessful telephone outreach was attempted today. There was no answer after multiple rings and no option to leave message. The patient was referred to the case management team for assistance with care management and care coordination.   Follow Up Plan: The care management team will reach out to the patient again over the next 7-14 days.   Kelli Churn RN, CCM, [facility] RN Care Manager [phone]

## 2020-05-18 ENCOUNTER — Ambulatory Visit
Admission: RE | Admit: 2020-05-18 | Discharge: 2020-05-18 | Disposition: A | Discharge status: Home / Self Care | Payer: BC Managed Care – PPO | Source: Ambulatory Visit | Attending: Physician Assistant | Admitting: Physician Assistant

## 2020-05-18 DIAGNOSIS — R109 Unspecified abdominal pain: Secondary | ICD-10-CM

## 2020-05-18 DIAGNOSIS — K5900 Constipation, unspecified: Secondary | ICD-10-CM

## 2020-05-21 ENCOUNTER — Encounter: Payer: BC Managed Care – PPO | Admitting: Internal Medicine

## 2020-05-22 ENCOUNTER — Ambulatory Visit (INDEPENDENT_AMBULATORY_CARE_PROVIDER_SITE_OTHER): Payer: BC Managed Care – PPO | Admitting: Pharmacist

## 2020-05-22 ENCOUNTER — Other Ambulatory Visit: Payer: Self-pay | Admitting: Endocrinology

## 2020-05-22 ENCOUNTER — Telehealth: Payer: BC Managed Care – PPO

## 2020-05-22 ENCOUNTER — Encounter: Payer: Self-pay | Admitting: Pharmacist

## 2020-05-22 DIAGNOSIS — Z79899 Other long term (current) drug therapy: Secondary | ICD-10-CM

## 2020-05-22 NOTE — Progress Notes (Signed)
   Subjective/Objective:    Patient ID: [name] [name], male    DOB: Jan 24, 1932, 87 y.o.   MRN: [id number]  HPI Patient is a 87 y.o. male who presents for medication review and management.  He is in good spirits and presents without assistance. Patient was referred on 04/24/20 and last seen by provider, Dr. Allyson Sabal, on 05/10/20.  Medication Adherence Questionnaire (A score of 2 or more points indicates risk for nonadherence)  Do you know what each of your medicines is for? 0 (1 point if no)  Do you ever have trouble remembering to take your medicine? 2 (2 points if yes)  Do you ever not take a medicine because you feel you do not need it?  0 (1 point if yes)  Do you think that any of your medicines is not helping you? 0 (1 point if yes)  Do you have any physical problems such as vision loss that keep you from taking your medicines as prescribed?  0 (2 points if yes)  Do you think any of your medicine is causing a side effect?  0 (1 point if yes)  Do you know the names of ALL of your medicines? 1 (1 point if no)  Do you think that you need ALL of your medicines? 0 (1 point if no)  In the past 6 months, have you missed getting a refill or a new prescription filled on time? 0 (1 point if yes)  How often do you miss taking a dose of medicine? 1 Never (0 points), 1 or 2 times a month (1 points), 1 time a week (2 points), 2 or more times a week (3 points).   TOTAL SCORE 4/14    Assessment/Plan:   Understanding of regimen: fair  Understanding of indications: poor  Potential of compliance: good  Patient has known adherence challenges based on score of 4 for questionnaire. Barriers include: lack of knowledge, forgetfulness, and desire to decrease amount of medications. Medication list reviewed and updated. Patient was provided with a printed medication list and pill organizer. In addition, wrote indication of each medication on bottle for patient to be aware of. Will send message to Dr. Daryll Drown  for review of Cilostazol with my recommendation of discontinuing medication. Follow-up appointment with provider. Written patient instructions provided.  This appointment required 45 minutes of patient care (this includes precharting, chart review, review of results, and face-to-face care).  Thank you for involving pharmacy to assist in providing this patient's care.

## 2020-05-22 NOTE — Patient Instructions (Signed)
[name] [name] it was a pleasure seeing you today.   Today we reviewed all of the medications you are currently taking. Included is an updated medication list. Please continue taking all medications as prescribed on this list. I am also going to talk to Dr. Gilles Chiquito regarding your medications.  To help you remember to take your medications:  - Supplied you with a pill organizer  If you have any questions please call the clinic and ask to speak with me.  Follow-up with provider at next scheduled appointment.

## 2020-05-28 ENCOUNTER — Telehealth: Payer: BC Managed Care – PPO

## 2020-06-05 ENCOUNTER — Ambulatory Visit: Payer: BC Managed Care – PPO | Admitting: *Deleted

## 2020-06-05 DIAGNOSIS — M1A9XX0 Chronic gout, unspecified, without tophus (tophi): Secondary | ICD-10-CM

## 2020-06-05 DIAGNOSIS — I10 Essential (primary) hypertension: Secondary | ICD-10-CM

## 2020-06-05 DIAGNOSIS — I5022 Chronic systolic (congestive) heart failure: Secondary | ICD-10-CM

## 2020-06-05 DIAGNOSIS — R1319 Other dysphagia: Secondary | ICD-10-CM

## 2020-06-05 DIAGNOSIS — I739 Peripheral vascular disease, unspecified: Secondary | ICD-10-CM

## 2020-06-05 DIAGNOSIS — E1165 Type 2 diabetes mellitus with hyperglycemia: Secondary | ICD-10-CM

## 2020-06-05 DIAGNOSIS — I2510 Atherosclerotic heart disease of native coronary artery without angina pectoris: Secondary | ICD-10-CM

## 2020-06-05 NOTE — Chronic Care Management (AMB) (Signed)
Care Management    RN Visit Note  06/05/2020 Name: [name] [name] MRN: [id number] DOB: 02-02-1932  Subjective: [name] [name] is a 87 y.o. year old male who is a primary care patient of [name] Catalina, MD. The care management team was consulted for assistance with disease management and care coordination needs.    Engaged with patient by telephone for follow up visit in response to provider referral for case management and/or care coordination services.   Consent to Services:   [name] [name] was given information about Care Management services today including:  1. Care Management services includes personalized support from designated clinical staff supervised by his physician, including individualized plan of care and coordination with other care providers 2. 24/7 contact phone numbers for assistance for urgent and routine care needs. 3. The patient may stop case management services at any time by phone call to the office staff.  Patient agreed to services and consent obtained.   Assessment: Review of patient past medical history, allergies, medications, health status, including review of consultants reports, laboratory and other test data, was performed as part of comprehensive evaluation and provision of chronic care management services.   SDOH (Social Determinants of Health) assessments and interventions performed:    Care Plan  No Known Allergies  Outpatient Encounter Medications as of 06/05/2020  Medication Sig Note  . allopurinol (ZYLOPRIM) 100 MG tablet Take 2 tablets by mouth once daily   . amLODipine (NORVASC) 10 MG tablet Take 1 tablet by mouth once daily   . aspirin EC 81 MG tablet Take 1 tablet (81 mg total) by mouth daily.   [name] atorvastatin (LIPITOR) 40 MG tablet Take 1 tablet by mouth once daily   . Blood Glucose Monitoring Suppl (ONETOUCH VERIO FLEX SYSTEM) w/Device KIT Use OneTouch Verio Flex meter to check blood sugar twice daily.   . cilostazol (PLETAL) 50 MG  tablet Take 1 tablet by mouth twice daily   . colchicine 0.6 MG tablet Take 1 tablet by mouth twice daily   . EQ LORATADINE 10 MG tablet TAKE 1 TABLET BY MOUTH DAILY AS NEEDED FOR RHINITIS.   . furosemide (LASIX) 20 MG tablet Take 1 tablet by mouth once daily   . gabapentin (NEURONTIN) 100 MG capsule TAKE 1 CAPSULE BY MOUTH THREE TIMES DAILY 05/22/2020: BID  . insulin NPH Human (HUMULIN N,NOVOLIN N) 100 UNIT/ML injection Inject 20 Units into the skin daily before breakfast. Per Dr Remus Blake OV note of 11/17/19 05/22/2020: 15 units   . Insulin Regular Human (NOVOLIN R RELION IJ) Inject 10 Units as directed 2 (two) times daily with a meal. Before  breakfast and supper Per Dr Remus Blake OV of 11/17/19   . Insulin Syringe-Needle U-100 31G X 15/64" 0.3 ML MISC Use to inject insulin 3 times a day   . losartan (COZAAR) 50 MG tablet Take 1 tablet by mouth once daily   . metFORMIN (GLUCOPHAGE-XR) 500 MG 24 hr tablet TAKE 2 TABLETS BY MOUTH ONCE DAILY WITH SUPPER   . metoprolol tartrate (LOPRESSOR) 25 MG tablet Take 1 tablet by mouth twice daily   . Misc Natural Products (PROSTATE HEALTH) CAPS Take 1 capsule by mouth daily at 6 (six) AM.   . OneTouch Delica Lancets 30G MISC 1 each by Does not apply route 2 (two) times daily. E 11.9 diagnosis   . ONETOUCH VERIO test strip USE AS DIRECTED TO CHECK BLOOD TWICE DAILY   . oxybutynin (DITROPAN) 5 MG tablet Take 1 tablet (5 mg total)  by mouth 3 (three) times daily.   . pantoprazole (PROTONIX) 40 MG tablet Take 1 tablet (40 mg total) by mouth daily.   . polyethylene glycol (MIRALAX) 17 g packet Take 17 g by mouth 2 (two) times daily.   [name] senna-docusate (SENOKOT S) 8.6-50 MG tablet Take 2 tablets by mouth 2 (two) times daily.   . tamsulosin (FLOMAX) 0.4 MG CAPS capsule Take 1 capsule by mouth once daily    No facility-administered encounter medications on file as of 06/05/2020.    Patient Active Problem List   Diagnosis Date Noted  . Esophageal dysphagia 04/24/2020   . Chronic gout 05/27/2018  . Constipation, unspecified 03/26/2018  . Type 2 diabetes mellitus with mild 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, bilateral ([place]) 03/24/2018  . Type 2 diabetes mellitus with microalbuminuria, with long-term current use of insulin ([place]) 11/13/2017  . Healthcare maintenance 11/13/2017  . Chronic left shoulder pain 11/13/2017  . Severe obesity (BMI 35.0-35.9 with comorbidity) ([place]) 11/13/2017  . Urethral stricture in male 11/13/2017  . Benign prostatic hyperplasia (BPH) with urinary urge incontinence 11/13/2017  . Chronic systolic heart failure ([place]) 10/31/2016  . Arthritis 12/19/2014  . Coronary artery disease involving native coronary artery of native heart without angina pectoris 09/30/2007  . Peripheral arterial disease ([place]) 09/30/2007  . Chronic non-seasonal allergic rhinitis 02/17/2006  . History of prostate cancer 02/17/2006  . Type 2 diabetes mellitus with peripheral neuropathy ([place]) 11/04/2005  . Hyperlipidemia 11/04/2005  . Essential hypertension 11/04/2005  . Gastroesophageal reflux disease with hiatal hernia 11/04/2005    Conditions to be addressed/monitored:  IDDM, HTN, CAD, HF, HLD, PVD, Gout, GERD  Care Plan : CCM RN- Diabetes Type 2 (Adult)  Updates made by Barrington Ellison, RN since 06/05/2020 12:00 AM    Problem: Glycemic Management (Diabetes, Type 2)   Priority: High    Long-Range Goal: IDDM, HTN, HF, HLD Management Optimized   Start Date: 07/26/2019  Expected End Date: 10/25/2020  Recent Progress: On track  Priority: High  Note:   CARE PLAN ENTRY (see longitudinal plan of care for additional care plan information)  Current Barriers:  . Chronic Disease Management support, education, and care coordination needs related to CHF, CAD, HTN, HLD, and DMII-successful outreach to patient to complete follow up assessment, he says he is doing OK, is voicing frustration that he is often unable to get an adequate sample of  blood to check his blood sugar, reports it was 189 this morning (fasting), he continues to holding his hands under warm water and milk his finger before pricking, he is asking if he is eligible for the [employer] or a CGM and says if he is and his insurance will cover it, he would like to try one, he also says is he had a home BP monitor , he would monitor his BP at home if he is instructed on it's use  Clinical Goal(s) related to CHF, CAD, HTN, HLD, and DMII:  Over the next 30 -60 days, patient will:  . Work with the care management team to address educational, disease management, and care coordination needs  . Begin or continue self health monitoring activities as directed today take medications as directed . Call provider office for new or worsened signs and symptoms Blood glucose findings outside established parameters and New or worsened symptom related to CHF, CAD, HTN, HLD, and DMII . Call care management team with questions or concerns . Verbalize basic understanding of patient centered plan of care  established today  Interventions related to CHF, CAD, HTN, HLD, and DMII:  . Evaluation of current treatment plans for HF, CAD, HTN, HLD and DM and patient's adherence to plan as established by provider . Assessed patient understanding of disease states . Appropriate assessments completed . Assessed patient's education and care coordination needs . Assessed self monitored CBGs and reviewed targets . Assessed frequency of hypoglycemia and hyperglycemia and reviewed treatment for both . Advised  patient this CCM RN will message [name] RD and [facility] re: his eligibility for the [facility] or CGM  . If he is eligible for the [facility] or Dexcom CGM, will message Dr. Daryll Drown and ask her to discuss with patient during his 5/20 clinic appointment . Will investigate securing home BP monitor for patient . Reviewed scheduled/upcoming provider appointments including: clinic  appointment with Dr Daryll Drown on 06/22/20  Patient Self Care Activities related to CHF, CAD, HTN, HLD, and DMII:  . Patient is unable to independently self-manage chronic health conditions . - check blood sugar at prescribed times . - check blood sugar if I feel it is too high or too low . - enter blood sugar readings and medication or insulin into daily log . - take the blood sugar log to all doctor visits . - take the blood sugar meter to all doctor visits . - notify provider of any health concerns       Plan: The care management team will reach out to the patient again over the next 30-60 days.  Kelli Churn RN, CCM, [facility] RN Care Manager [phone]

## 2020-06-05 NOTE — Patient Instructions (Signed)
Visit Information It was nice speaking with you today. Goals Addressed            This Visit's Progress   . Make and Keep All Appointments       Timeframe:  Long-Range Goal Priority:  High Start Date:             03/12/20                Expected End Date:       10/05/20                Follow Up Date 07/06/20   - ask family or friend for a ride - call to cancel if needed    Why is this important?    Part of staying healthy is seeing the doctor for follow-up care.   If you forget your appointments, there are some things you can do to stay on track.    Notes: 06/05/20 meeting goal    . Monitor and Manage My Blood Sugar-Diabetes Type 2       Timeframe:  Long-Range Goal Priority:  High Start Date:    07/26/19                         Expected End Date:        11/02/20               Follow Up Date 07/06/20    - check blood sugar at prescribed times - check blood sugar if I feel it is too high or too low - enter blood sugar readings and medication or insulin into daily log - take the blood sugar log to all doctor visits - take the blood sugar meter to all doctor visits  - talk to Dr Daryll Drown about managing my DM - done 03/09/20   Why is this important?    Checking your blood sugar at home helps to keep it from getting very high or very low.   Writing the results in a diary or log helps the doctor know how to care for you.   Your blood sugar log should have the time, date and the results.   Also, write down the amount of insulin or other medicine that you take.   Other information, like what you ate, exercise done and how you were feeling, will also be helpful.     Notes: meeting A1C target of <8.0% on 03/09/20 with A1C= 7.2% w/o increased incidences of hypoglycemia       The patient verbalized understanding of instructions, educational materials, and care plan provided today and declined offer to receive copy of patient instructions, educational materials, and care plan.   The care  management team will reach out to the patient again over the next 30-60 days.   Kelli Churn RN, CCM, [facility] RN Care Manager [phone]

## 2020-06-14 ENCOUNTER — Telehealth: Payer: Self-pay | Admitting: Dietician

## 2020-06-14 ENCOUNTER — Encounter: Payer: Self-pay | Admitting: Dietician

## 2020-06-14 NOTE — Telephone Encounter (Signed)
I was asked to contact Mr Genova about using a CGM because he is having a difficult time getting enough blood from his fingers. After taking over the phone he denied ever having received agreed to have me reorder the [facility] on his behalf and when he gets it, he will call me.  Call to [employer]: they sent him a Freestyle libre 2 reader, 3 sensors, skin tac last July. They can reinstate his account with updated clinical notes ( faxed today) but he will have to find his reader or purchase a new reader.   Called patient. He said he will try to find the box with the CGM supplies in it and call me in the morning to let me know if he found it. Mailed the DME company contact information to him .

## 2020-06-20 ENCOUNTER — Encounter: Payer: Self-pay | Admitting: Dietician

## 2020-06-22 ENCOUNTER — Encounter: Payer: Self-pay | Admitting: Internal Medicine

## 2020-06-22 ENCOUNTER — Telehealth: Payer: BC Managed Care – PPO

## 2020-06-22 ENCOUNTER — Ambulatory Visit (INDEPENDENT_AMBULATORY_CARE_PROVIDER_SITE_OTHER): Payer: BC Managed Care – PPO | Admitting: Internal Medicine

## 2020-06-22 ENCOUNTER — Other Ambulatory Visit: Payer: Self-pay

## 2020-06-22 VITALS — BP 119/65 | HR 72 | Temp 97.7°F | Ht 73.0 in | Wt 269.4 lb

## 2020-06-22 DIAGNOSIS — E1142 Type 2 diabetes mellitus with diabetic polyneuropathy: Secondary | ICD-10-CM

## 2020-06-22 DIAGNOSIS — Z794 Long term (current) use of insulin: Secondary | ICD-10-CM | POA: Diagnosis not present

## 2020-06-22 DIAGNOSIS — E1165 Type 2 diabetes mellitus with hyperglycemia: Secondary | ICD-10-CM | POA: Diagnosis not present

## 2020-06-22 DIAGNOSIS — K449 Diaphragmatic hernia without obstruction or gangrene: Secondary | ICD-10-CM

## 2020-06-22 DIAGNOSIS — I739 Peripheral vascular disease, unspecified: Secondary | ICD-10-CM | POA: Diagnosis not present

## 2020-06-22 DIAGNOSIS — K5900 Constipation, unspecified: Secondary | ICD-10-CM

## 2020-06-22 DIAGNOSIS — I10 Essential (primary) hypertension: Secondary | ICD-10-CM

## 2020-06-22 DIAGNOSIS — G3184 Mild cognitive impairment, so stated: Secondary | ICD-10-CM

## 2020-06-22 DIAGNOSIS — I5042 Chronic combined systolic (congestive) and diastolic (congestive) heart failure: Secondary | ICD-10-CM

## 2020-06-22 DIAGNOSIS — K219 Gastro-esophageal reflux disease without esophagitis: Secondary | ICD-10-CM

## 2020-06-22 LAB — POCT GLYCOSYLATED HEMOGLOBIN (HGB A1C): Hemoglobin A1C: 8.2 % — AB (ref 4.0–5.6)

## 2020-06-22 LAB — GLUCOSE, CAPILLARY: Glucose-Capillary: 191 mg/dL — ABNORMAL HIGH (ref 70–99)

## 2020-06-22 MED ORDER — PANTOPRAZOLE SODIUM 40 MG PO TBEC: 40.0000 mg | DELAYED_RELEASE_TABLET | Freq: Two times a day (BID) | ORAL | 3 refills | Status: DC

## 2020-06-22 MED ORDER — MILK OF MAGNESIA 7.75 % PO SUSP: 5.0000 mL | Freq: Every day | ORAL | 0 refills | Status: DC | PRN

## 2020-06-22 NOTE — Progress Notes (Signed)
Subjective:    Patient ID: [name] [name], male    DOB: 11-26-1931, 87 y.o.   MRN: [id number]  CC: 3 month follow up for DM2  HPI  [name] [name] is an 87 year old man with PMH of BPH, h/o prostate cancer, gout, arthritis (shoulder), HTN, HFrEF chronic, PAD/CAD and DM2 who presents for follow up.  [name] [name] has been seen multiple times in the last 29months for constipation and dysphagia.  He had a swallow study which showed an ill defined swallowing issue.  He did not realize he was on a PPI and recently restarted this.  He also had a CT scan of the abdomen which showed constipation.  He has been asked to take daily miralax, senna and colace.  CT scan also showed a rectal wall prominence, maybe due prostatitis.  He does have BPH and history of prostate cancer.   Today [name] [name] reports continued feeling like food gets stuck and occasional pain in the lower chest/upper abdomen depending on what food he eats.  He tells me that he was told he had an ulcer when he was 27 or 32 and was advised to take goat's milk.  He is on a PPI, but reports taking it after meals.  We discussed the correct timing of the PPI dose.   We did a Mini-Cog today and he got a 2 out of 5 (clock was mildly abnormal, only remembered 2 words) showing some mild cognitive impairment. I think this may make it difficult for him to adhere to a complex medical regimen.   He notes that a provider advised him to stop taking cilostazol, but he would like to talk to his Cardiologist about this.  He still has the medication, but has not taken it since that appointment.  He has no pain in his legs, or recurrence of claudication today.   Review of Systems  Constitutional: Negative for activity change, appetite change and chills.  Respiratory: Negative for cough, choking, chest tightness and shortness of breath.   Cardiovascular: Negative for chest pain and leg swelling.  Gastrointestinal: Positive for abdominal distention, abdominal  pain and constipation.       Discomfort in upper abdomen, lower chest.   Genitourinary: Positive for frequency. Negative for difficulty urinating, dysuria, penile discharge and penile pain.  Musculoskeletal: Positive for arthralgias and back pain. Negative for gait problem.  Skin: Negative for color change and rash.  Neurological: Negative for dizziness and weakness.  Psychiatric/Behavioral: Negative for decreased concentration and dysphoric mood.       Objective:   Physical Exam Vitals and nursing note reviewed.  Constitutional:      General: He is not in acute distress.    Appearance: Normal appearance. He is not toxic-appearing.  HENT:     Head: Normocephalic and atraumatic.  Eyes:     General:        Right eye: No discharge.        Left eye: No discharge.     Conjunctiva/sclera: Conjunctivae normal.  Cardiovascular:     Rate and Rhythm: Normal rate and regular rhythm.     Heart sounds: Normal heart sounds.     Comments: Decreased pulse in the right DP.   Pulmonary:     Effort: Pulmonary effort is normal. No respiratory distress.     Breath sounds: Normal breath sounds. No wheezing.  Abdominal:     General: Bowel sounds are normal. There is distension (non tense, due to obesity).  Palpations: Abdomen is soft. There is no mass.     Tenderness: There is no abdominal tenderness.     Hernia: No hernia is present.  Musculoskeletal:        General: No swelling, tenderness, deformity or signs of injury.  Skin:    General: Skin is warm and dry.  Neurological:     General: No focal deficit present.     Mental Status: He is alert and oriented to person, place, and time.     Cranial Nerves: No cranial nerve deficit.     Comments: MIni Cog 2/5, clock abnormal and only remembered 2 of 3 words.   Psychiatric:        Mood and Affect: Mood normal.        Behavior: Behavior normal.    BMET today.   A1C 8.2       Assessment & Plan:  Return to see [name] ASAP.  Return to  see me in 3 months.

## 2020-06-22 NOTE — Patient Instructions (Addendum)
[name] [name] - -  Thank you for coming in to see me today!  Your DM control is a little worse today with an A1C of 8.2.  I would continue to take your insulin as you are.  We will set you up with an appointment to see [name] Plyler to have your [facility] put on.    For your protonix (pantoprazole, acid reducing medication) please take this medication 30 minutes before meals on an empty stomach to get the best results.    For your constipation.  Please continue to take senna and colace daily.  You can also take Milk of Magnesia over the counter.  Please do not take this medication more than 4 times per day.  You can take this medication until you are having one bowel movement every day to every other day.     Please stop the miralax.    Please make an appointment with [name] to apply your [place] system.   Please come back to see me in 3 months.

## 2020-06-25 DIAGNOSIS — G3184 Mild cognitive impairment, so stated: Secondary | ICD-10-CM | POA: Insufficient documentation

## 2020-06-25 NOTE — Assessment & Plan Note (Signed)
This is chronic and controlled.  He reports leg pain on the right when he walks a long distance, but otherwise this is controlled.  He was recently told to stop his cilostazol by someone on the phone.  He is not sure who.  He would like to discuss with his cardiologist and I did recommend he do that.  Continue aspirin and statin.

## 2020-06-25 NOTE — Assessment & Plan Note (Signed)
He is doing well without complaints today.  No SOB, swelling or chest pain.  He follows with his Cardiologist yearly.  He is on medications including lasix to help keep his fluid under control.  He is on a statin.

## 2020-06-25 NOTE — Assessment & Plan Note (Signed)
He has persistent feeling of heartburn, dysphagia and discomfort related to food. This moves from his stomach into his chest.  We discussed how he is taking his PPI and he reports not being aware of needing to take the medication 30 minutes before meals.  I advised him to take the medication 30 minutes before breakfast and dinner.  He reported that he would try this.   Continue protonix BID

## 2020-06-25 NOTE — Assessment & Plan Note (Signed)
I have been concerned about his memory and Mini-Cog done today showed MCI with a 2/5, inaccurate clock (mild) and inability to remember 1 of 3 words.  I think he would benefit from further testing either in our clinic with [facility] or with full neuropsychological testing.   At next visit I will plan to complete a full neurological exam.  He does have medications which could be making his memory worse, such as oxybutynin (confusion 1-5%, memory impairment < 1%).  He has a complicated cardiovascular history which could make vascular dementia likely.  He does not have head imaging in epic and does not report a history of stroke.  I will plan to ask him to stop this medication as well at next visit given this can be adding to his constipation as well.   If no secondary cause is discovered will plan to discuss with him appropriate memory aids, etc to help with medication adherence.

## 2020-06-25 NOTE — Assessment & Plan Note (Signed)
A1C today is 8.2 which is higher than his goal of < 8.  He is taking 15 units of NPH daily and 10 units BID before meals along with metformin.  He is due to see [name] to help him get set up with [employer].  He has not been able to get good glucose measurements using his fingers, though he has tried.  His blood sugar log from today showed high numbers in the evenings.  He will likely need modification of his regimen, but the CGM will help US guide therapy.  His LDL is 46.  We will check his renal function today.

## 2020-06-25 NOTE — Assessment & Plan Note (Signed)
This is the main issue we discussed today.  He is taking senna and colace.  CT scan of the abdomen showed moderate stool burden.  He notes miralax does not work.  I advised that he take milk of magnesia daily with the goal of having a BM that is soft every 1-2 days.

## 2020-06-28 ENCOUNTER — Other Ambulatory Visit: Payer: Self-pay

## 2020-06-28 ENCOUNTER — Telehealth: Payer: Self-pay | Admitting: Dietician

## 2020-06-28 ENCOUNTER — Encounter: Payer: Self-pay | Admitting: Dietician

## 2020-06-28 ENCOUNTER — Ambulatory Visit (INDEPENDENT_AMBULATORY_CARE_PROVIDER_SITE_OTHER): Payer: BC Managed Care – PPO | Admitting: Dietician

## 2020-06-28 DIAGNOSIS — R809 Proteinuria, unspecified: Secondary | ICD-10-CM | POA: Diagnosis not present

## 2020-06-28 DIAGNOSIS — Z794 Long term (current) use of insulin: Secondary | ICD-10-CM

## 2020-06-28 DIAGNOSIS — E1129 Type 2 diabetes mellitus with other diabetic kidney complication: Secondary | ICD-10-CM

## 2020-06-28 NOTE — Progress Notes (Signed)
Diabetes Self-Management Education  Visit Type: Follow-up  Appt. Start Time: 1425 Appt. End Time: 4967  06/28/2020  [name], identified by name and date of birth, is a 87 y.o. male with a diagnosis of Diabetes:  [name] Type 2  ASSESSMENT  [name] [name] brought the box that he got from a DME supply company  last July. The reader was in it and okay. The sensors were all expired so I provided him with a sample today. At the end of the visit he stated that he is unsure that he can remember the steps of how to check his blood sugar with the CGM. He is returning next week if support is needed and in two weeks to see if he wants to put another CGM sensor on. on y the low alarm. is on at this time as he got upset when I asked him to keep the reader near him or it would alarm.   He is unable to pay anything out of pocket and [employer] only pays 80% on the CGM, we will look in to finding if [employer] is in network if he will gt 100% coverage.   Hi blood sugar was 298 today I the office at 1:42 PM. He says he took 15 units of cloudy insulin + 10 units of clear insulin at 730 am this morning before eating: Egg, bowl of oatmeal, 1/2 banana, 1/2 cup cofee w/ cream & sugar. Th only other thing he has had today is a Piece of candy before our visit. He admits to having a difficult time affording his medicine and has been stretching his insulin.   Will let his doctor know and ask pharmacy for assistance if needed.   Lab Results  Component Value Date   HGBA1C 8.2 (A) 06/22/2020   HGBA1C 7.2 (A) 03/09/2020   HGBA1C 8.8 (H) 11/16/2019   HGBA1C 8.2 (H) 08/09/2019   HGBA1C 7.5 (A) 05/11/2019       Diabetes Self-Management Education - 06/28/20 1500      Visit Information   Visit Type Follow-up      Health Coping   How would you rate your overall health? Good      Pre-Education Assessment   Patient understands the diabetes disease and treatment process. Demonstrates understanding / competency     Patient understands incorporating nutritional management into lifestyle. Demonstrates understanding / competency    Patient undertands incorporating physical activity into lifestyle. Demonstrates understanding / competency    Patient understands using medications safely. Demonstrates understanding / competency    Patient understands monitoring blood glucose, interpreting and using results Needs Instruction    Patient understands prevention, detection, and treatment of acute complications. Demonstrates understanding / competency    Patient understands prevention, detection, and treatment of chronic complications. Demonstrates understanding / competency    Patient understands how to develop strategies to address psychosocial issues. Demonstrates understanding / competency    Patient understands how to develop strategies to promote health/change behavior. Demonstrates understanding / competency      Complications   Last HgB A1C per patient/outside source 8.2 %    How often do you check your blood sugar? 1-2 times/day    Fasting Blood glucose range (mg/dL) 130-179;>200    Postprandial Blood glucose range (mg/dL) 70-129;130-179;180-200;>200    Number of hypoglycemic episodes per month 0    Number of hyperglycemic episodes per week 3    Can you tell when your blood sugar is high? No    Have you had  a dilated eye exam in the past 12 months? Yes    Have you had a dental exam in the past 12 months? No   not applicable   Are you checking your feet? Yes   needs help   How many days per week are you checking your feet? 7      Dietary Intake   Breakfast egg, oatmeal, coffee banana    Lunch often skips      Exercise   Exercise Type ADL's;Light (walking / raking leaves)    How many days per week to you exercise? 20    How many minutes per day do you exercise? 7    Total minutes per week of exercise 140      Patient Education   Previous Diabetes Education Yes (please comment)   here and classes when  diagnosed   Monitoring Other (comment)   assisted with obtaining and placing and learning how to use CGM. repeated often to help him remember     Individualized Goals (developed by patient)   Monitoring  test my blood glucose as discussed      Post-Education Assessment   Patient understands how to develop strategies to promote health/change behavior. Demonstrates understanding / competency      Outcomes   Expected Outcomes Demonstrated interest in learning. Expect positive outcomes    Future DMSE 2 wks    Program Status Not Completed      Subsequent Visit   Since your last visit have you continued or begun to take your medications as prescribed? No   He states he is stretching his insulin because of cost   Since your last visit have you had your blood pressure checked? Yes    Is your most recent blood pressure lower, unchanged, or higher since your last visit? Unchanged    Since your last visit have you experienced any weight changes? No change    Since your last visit, are you checking your blood glucose at least once a day? No   meter showed that he misses some days. He says he forgets sometimes          Individualized Plan for Diabetes Self-Management Training:   Learning Objective:  Patient will have a greater understanding of diabetes self-management. Patient education plan is to attend individual and/or group sessions per assessed needs and concerns.   Her/His plan for support of her diabetes self care training is to attend her/his doctor and diabetes educator/nutrition appointments.   Plan:   Patient Instructions  1- check blood sugar by hitting check glucose symbol on reader at least 5 times a day-  2-  right before you go to sleep and right away when you wake up  3- call with any questions or concerns  [name] [phone]  Expected Outcomes:  Demonstrated interest in learning. Expect positive outcomes  Education material provided: Diabetes Resources  If  problems or questions, patient to contact team via:  Phone  Future DSME appointment: 2 wks  [name], RD 06/28/2020 3:24 PM.

## 2020-06-28 NOTE — Telephone Encounter (Signed)
Called [employer] who is a Medicare DME supplier as requested by [name] [name] about his CGM order. They got all the paperwork, they say [facility] covers 80%, the secondary covers 50% of the 20% leftover from Medicare.   They are not in network with his secondary insurance. They states they have a " helping hands program" that lets people pay what they can on their bill.

## 2020-06-28 NOTE — Patient Instructions (Signed)
1- check blood sugar by hitting check glucose symbol on reader at least 5 times a day-  2-  right before you go to sleep and right away when you wake up  3- call with any questions or concerns  [name] [phone]

## 2020-06-29 LAB — BMP8+ANION GAP
Anion Gap: 17 mmol/L (ref 10.0–18.0)
BUN/Creatinine Ratio: 13 (ref 10–24)
BUN: 18 mg/dL (ref 8–27)
CO2: 21 mmol/L (ref 20–29)
Calcium: 9.1 mg/dL (ref 8.6–10.2)
Chloride: 99 mmol/L (ref 96–106)
Creatinine, Ser: 1.43 mg/dL — ABNORMAL HIGH (ref 0.76–1.27)
Glucose: 320 mg/dL — ABNORMAL HIGH (ref 65–99)
Potassium: 4.6 mmol/L (ref 3.5–5.2)
Sodium: 137 mmol/L (ref 134–144)
eGFR: 48 mL/min/{1.73_m2} — ABNORMAL LOW (ref >59)

## 2020-07-03 ENCOUNTER — Telehealth: Payer: Self-pay | Admitting: Dietician

## 2020-07-03 ENCOUNTER — Ambulatory Visit: Payer: BC Managed Care – PPO | Admitting: Dietician

## 2020-07-03 ENCOUNTER — Telehealth: Payer: BC Managed Care – PPO

## 2020-07-03 NOTE — Telephone Encounter (Signed)
Called to follow up with patient about how he is doing with the CGM. There was no answer

## 2020-07-10 ENCOUNTER — Telehealth: Payer: Self-pay | Admitting: Dietician

## 2020-07-10 NOTE — Telephone Encounter (Signed)
Reached out to [employer] to see if they can help him obtain his CGM supplies. They are in network with both his insurances. Account created and they are verifying his coverage before sending [place] an order.

## 2020-07-11 ENCOUNTER — Telehealth: Payer: BC Managed Care – PPO

## 2020-07-11 ENCOUNTER — Telehealth: Payer: Self-pay

## 2020-07-11 NOTE — Telephone Encounter (Signed)
  Care Management   Outreach Note  07/11/2020 Name: [name] [name] MRN: [id number] DOB: [birth year]  Referred by: [name], MD Reason for referral : Chronic Care Management (IDDM, HTN, CAD)   An unsuccessful telephone outreach was attempted today. The patient was referred to the case management team for assistance with care management and care coordination.   Follow Up Plan: Telephone follow up appointment with care management team member scheduled for: One week  [name], RN, BSN, CCM Care Management Coordinator [facility] Phone: [phone] / Fax: [phone]

## 2020-07-12 ENCOUNTER — Telehealth: Payer: Self-pay | Admitting: Dietician

## 2020-07-12 ENCOUNTER — Ambulatory Visit: Payer: BC Managed Care – PPO | Admitting: Dietician

## 2020-07-12 NOTE — Telephone Encounter (Signed)
I called patient to follow up in his Freestyle Libre 2 CGM use and his appointment today. He said " It went off and stopped working the next day" and " I can't work that thing" . He was instructed to remove the sensor and bring it with him to hr rescheduled appointment on Monday.

## 2020-07-16 ENCOUNTER — Ambulatory Visit: Payer: BC Managed Care – PPO | Admitting: Dietician

## 2020-07-16 ENCOUNTER — Telehealth: Payer: BC Managed Care – PPO

## 2020-07-19 ENCOUNTER — Telehealth: Payer: BC Managed Care – PPO

## 2020-07-19 ENCOUNTER — Other Ambulatory Visit: Payer: Self-pay | Admitting: Endocrinology

## 2020-07-19 ENCOUNTER — Ambulatory Visit: Payer: BC Managed Care – PPO

## 2020-07-19 NOTE — Patient Instructions (Signed)
Visit Information   Goals Addressed             This Visit's Progress    Blood Pressure < 130/80        BP Readings from Last 3 Encounters:  06/22/20 119/65  05/10/20 116/63  04/24/20 124/77   Meeting blood pressure targets      HEMOGLOBIN A1C < 8       Lab Results  Component Value Date   HGBA1C 8.2 (A) 06/22/2020   HGBA1C 7.2 (A) 03/09/2020   HGBA1C 8.8 (H) 11/16/2019   Lab Results  Component Value Date   MICROALBUR 3.5 (H) 08/09/2019   LDLCALC 46 08/09/2019   CREATININE 1.43 (H) 06/28/2020     03/09/20- Now meeting target      Make and Keep All Appointments       Timeframe:  Long-Range Goal Priority:  High Start Date:             03/12/20                Expected End Date:       10/05/20                Follow Up Date 10/05/20   - ask family or friend for a ride - call to cancel if needed    Why is this important?   Part of staying healthy is seeing the doctor for follow-up care.  If you forget your appointments, there are some things you can do to stay on track.    Notes:       Monitor and Manage My Blood Sugar-Diabetes Type 2       Timeframe:  Long-Range Goal Priority:  High Start Date:    07/26/19                         Expected End Date:        11/02/20               Follow Up Date 11/02/20   - check blood sugar at prescribed times - check blood sugar if I feel it is too high or too low - enter blood sugar readings and medication or insulin into daily log - take the blood sugar log to all doctor visits - take the blood sugar meter to all doctor visits  - talk to Dr Daryll Drown about managing my DM - done 03/09/20   Why is this important?   Checking your blood sugar at home helps to keep it from getting very high or very low.  Writing the results in a diary or log helps the doctor know how to care for you.  Your blood sugar log should have the time, date and the results.  Also, write down the amount of insulin or other medicine that you take.  Other  information, like what you ate, exercise done and how you were feeling, will also be helpful.     Notes: meeting A1C target of <8.0% on 03/09/20 with A1C= 7.2% w/o increased incidences of hypoglycemia      Weight (lb) < 200 lb (90.7 kg)       Wt Readings from Last 3 Encounters:  06/22/20 269 lb 6.4 oz (122.2 kg)  05/10/20 274 lb (124.3 kg)  04/24/20 274 lb 11.2 oz (124.6 kg)    Not meeting weight target         The patient verbalized understanding of instructions, educational materials, and  care plan provided today and declined offer to receive copy of patient instructions, educational materials, and care plan.   The care management team will reach out to the patient again over the next 7 days.   [name], RN, BSN, CCM Care Management Coordinator [facility] Internal Medicine Phone: [phone] / Fax: [phone]

## 2020-07-19 NOTE — Chronic Care Management (AMB) (Signed)
Care Management    RN Visit Note  07/19/2020 Name: [name] [name] MRN: [id number] DOB: [birth year]  Subjective: [name] [name] is a 87 y.o. year old male who is a primary care patient of [name], MD. The care management team was consulted for assistance with disease management and care coordination needs.    Engaged with patient by telephone for follow up visit in response to provider referral for case management and/or care coordination services.   Consent to Services:   [name] [name] was given information about Care Management services today including:  Care Management services includes personalized support from designated clinical staff supervised by his physician, including individualized plan of care and coordination with other care providers 24/7 contact phone numbers for assistance for urgent and routine care needs. The patient may stop case management services at any time by phone call to the office staff.  Patient agreed to services and consent obtained.    Assessment: Patient continues to experience difficulty with swallowing.. See Care Plan below for interventions and patient self-care actives. Follow up Plan: Patient would like continued follow-up.  CCM RNCM will outreach the patient within the next couple days after follow up for Gastroenterology appt. Completed.  Patient will call office if needed prior to next encounter  Review of patient past medical history, allergies, medications, health status, including review of consultants reports, laboratory and other test data, was performed as part of comprehensive evaluation and provision of chronic care management services.  Call placed to [facility] and per Chirisa, patient had appt with Dr. Cleophus Molt on 07/17/20 and he was a no show.  Followed up with patient and he states he has difficulty hearing the robocalls for appointment reminders from the practice for appointments.  He stated someone also called  from the practice and she talked so quickly he couldn't understand what they said.  Plan to collaborate with team to assist patient with his appointments. SDOH (Social Determinants of Health) assessments and interventions performed:    Care Plan  No Known Allergies  Outpatient Encounter Medications as of 07/19/2020  Medication Sig Note   allopurinol (ZYLOPRIM) 100 MG tablet Take 2 tablets by mouth once daily    amLODipine (NORVASC) 10 MG tablet Take 1 tablet by mouth once daily    aspirin EC 81 MG tablet Take 1 tablet (81 mg total) by mouth daily.    atorvastatin (LIPITOR) 40 MG tablet Take 1 tablet by mouth once daily    Blood Glucose Monitoring Suppl (ONETOUCH VERIO FLEX SYSTEM) w/Device KIT Use OneTouch Verio Flex meter to check blood sugar twice daily.    cilostazol (PLETAL) 50 MG tablet Take 1 tablet by mouth twice daily    colchicine 0.6 MG tablet Take 1 tablet by mouth twice daily    EQ LORATADINE 10 MG tablet TAKE 1 TABLET BY MOUTH DAILY AS NEEDED FOR RHINITIS.    furosemide (LASIX) 20 MG tablet Take 1 tablet by mouth once daily    gabapentin (NEURONTIN) 100 MG capsule TAKE 1 CAPSULE BY MOUTH THREE TIMES DAILY 05/22/2020: BID   insulin NPH Human (HUMULIN N,NOVOLIN N) 100 UNIT/ML injection Inject 20 Units into the skin daily before breakfast. Per Dr [name] OV note of 11/17/19 05/22/2020: 15 units    Insulin Regular Human (NOVOLIN R RELION IJ) Inject 10 Units as directed 2 (two) times daily with a meal. Before  breakfast and supper Per Dr [name] OV of 11/17/19    Insulin Syringe-Needle U-100 31G X 15/64" 0.3  ML MISC Use to inject insulin 3 times a day    losartan (COZAAR) 50 MG tablet Take 1 tablet by mouth once daily    magnesium hydroxide (MILK OF MAGNESIA) 400 MG/5ML suspension Take 5 mLs by mouth daily as needed for moderate constipation.    metFORMIN (GLUCOPHAGE-XR) 500 MG 24 hr tablet TAKE 2 TABLETS BY MOUTH ONCE DAILY WITH SUPPER    metoprolol tartrate (LOPRESSOR) 25 MG tablet  Take 1 tablet by mouth twice daily    Misc Natural Products (PROSTATE HEALTH) CAPS Take 1 capsule by mouth daily at 6 (six) AM.    OneTouch Delica Lancets 16X MISC 1 each by Does not apply route 2 (two) times daily. E 11.9 diagnosis    ONETOUCH VERIO test strip USE AS DIRECTED TO CHECK BLOOD TWICE DAILY    oxybutynin (DITROPAN) 5 MG tablet Take 1 tablet (5 mg total) by mouth 3 (three) times daily.    pantoprazole (PROTONIX) 40 MG tablet Take 1 tablet (40 mg total) by mouth 2 (two) times daily. TAKE 30 MINUTES BEFORE MEALS.    senna-docusate (SENOKOT S) 8.6-50 MG tablet Take 2 tablets by mouth 2 (two) times daily.    tamsulosin (FLOMAX) 0.4 MG CAPS capsule Take 1 capsule by mouth once daily    No facility-administered encounter medications on file as of 07/19/2020.    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, bilateral ([place]) 03/24/2018   Type 2 diabetes mellitus with microalbuminuria, with long-term current use of insulin ([place]) 11/13/2017   Healthcare maintenance 11/13/2017   Chronic left shoulder pain 11/13/2017   Severe obesity (BMI 35.0-35.9 with comorbidity) ([place]) 11/13/2017   Urethral stricture in male 11/13/2017   Benign prostatic hyperplasia (BPH) with urinary urge incontinence 11/13/2017   Chronic combined systolic (congestive) and diastolic (congestive) heart failure ([place]) 10/31/2016   Arthritis 12/19/2014   Coronary artery disease involving native coronary artery of native heart without angina pectoris 09/30/2007   Peripheral arterial disease ([place]) 09/30/2007   Chronic non-seasonal allergic rhinitis 02/17/2006   History of prostate cancer 02/17/2006   Type 2 diabetes mellitus with peripheral neuropathy ([place]) 11/04/2005   Hyperlipidemia  11/04/2005   Essential hypertension 11/04/2005   Gastroesophageal reflux disease with hiatal hernia 11/04/2005    Conditions to be addressed/monitored: HTN, HLD, DMII, and dysphagia  Care Plan : CCM RN- Diabetes Type 2 (Adult)  Updates made by [name], RN since 07/19/2020 12:00 AM     Problem: Glycemic Management (Diabetes, Type 2)   Priority: High     Long-Range Goal: IDDM, HTN, HF, HLD Management Optimized   Start Date: 07/26/2019  Expected End Date: 10/25/2020  Recent Progress: On track  Priority: High  Note:   CARE PLAN ENTRY (see longitudinal plan of care for additional care plan information)  Current Barriers:  Chronic Disease Management support, education, and care coordination needs related to CHF, CAD, HTN, HLD, and DMII- Successful outreach to patient to discuss his current medical issues.  Patient is experiencing difficulty swallowing, noting it has been going on a few months.  He was seen by [facility] on 07/17/20, per patient he had a swallowing evaluation and he states he was not given any follow up information.  This RNCM will follow up and investigate what the plan for patient  regarding his swallowing test follow up.  Clinical Goal(s) related to CHF, CAD, HTN, HLD, and DMII:  Over the next 30 -60 days, patient will:  Work with the care management team to address educational, disease management, and care coordination needs  Begin or continue self health monitoring activities as directed today take medications as directed Call provider office for new or worsened signs and symptoms Blood glucose findings outside established parameters and New or worsened symptom related to CHF, CAD, HTN, HLD, and DMII Call care management team with questions or concerns Verbalize basic understanding of patient centered plan of care established today  Interventions related to CHF, CAD, HTN, HLD, and DMII:  Evaluation of current treatment plans for HF, CAD, HTN, HLD and  DM and patient's adherence to plan as established by provider Assessed patient understanding of disease states Appropriate assessments completed Assessed patient's education and care coordination needs Assessed self monitored CBGs and reviewed targets- Discussed with patient today and he states he takes his blood sugar 2x day (as ordered) and he could not remember what it was this morning. Assessed frequency of hypoglycemia and hyperglycemia and reviewed treatment for both Advised  patient this CCM RN will message [name] clinic RD re: assisting with swallowing issues and diet.  Patient notes he missed an appointment on 07/13/20 and he needs to reschedule Will investigate securing home BP monitor for patient Reviewed scheduled/upcoming provider appointments including: Gastroenterology follow up  Patient Self Care Activities related to CHF, CAD, HTN, HLD, and DMII:  Patient is unable to independently self-manage chronic health conditions - check blood sugar at prescribed times - check blood sugar if I feel it is too high or too low - enter blood sugar readings and medication or insulin into daily log - take the blood sugar log to all doctor visits - take the blood sugar meter to all doctor visits - notify provider of any health concerns       Plan: Telephone follow up appointment with care management team member scheduled for:  30 days  [name], RN, BSN, CCM Care Management Coordinator [facility] Phone: [phone] / Fax: [phone]

## 2020-07-20 ENCOUNTER — Telehealth: Payer: BC Managed Care – PPO

## 2020-07-20 ENCOUNTER — Telehealth: Payer: Self-pay

## 2020-07-20 ENCOUNTER — Other Ambulatory Visit: Payer: Self-pay

## 2020-07-20 NOTE — Telephone Encounter (Signed)
  Chronic Care Management   Note  07/20/2020 Name: [name] [name] MRN: [id number] DOB: Sep 08, 1931    Follow up plan: Call to patient after follow up call with [facility].  Spoke with Crystal at [facility] and obtained appointment information at the request of patient.  He was concerned he cannot hear when they call for a reminder when he has appt. Requested [facility] put a note/highlight in their system that patient is very hard of hearing and you need to speak clearly and slowly when leaving a message.  His follow up appt is July 24, 2020 at 3:15 PM which I gave to patient and he read back to this RNCM.  Patient was very thankful and plan to follow up with him for care coordination in 4 weeks. Telephone follow up appointment with care management team member scheduled for: 4 weeks  [name], RN, BSN, CCM Care Management Coordinator [facility] Phone: [phone] / Fax: [phone]

## 2020-07-23 ENCOUNTER — Telehealth: Payer: Self-pay | Admitting: Dietician

## 2020-07-23 NOTE — Telephone Encounter (Signed)
Rescheduled his appointment that he missed last week.

## 2020-07-23 NOTE — Progress Notes (Signed)
Yes, that would be great [name].  Thank you for your help!    He told me that he has "ways" of getting blood from his fingers.  So if he would prefer to continue on with this, I am okay with it.

## 2020-07-25 ENCOUNTER — Ambulatory Visit: Payer: BC Managed Care – PPO | Admitting: Dietician

## 2020-08-16 ENCOUNTER — Ambulatory Visit: Payer: BC Managed Care – PPO

## 2020-08-16 NOTE — Patient Instructions (Signed)
Visit Information   Goals Addressed             This Visit's Progress    Make and Keep All Appointments       Timeframe:  Long-Range Goal Priority:  High Start Date:             03/12/20                Expected End Date:       10/05/20                Follow Up Date 10/05/20   - ask family or friend for a ride - call to cancel if needed    Why is this important?   Part of staying healthy is seeing the doctor for follow-up care.  If you forget your appointments, there are some things you can do to stay on track.    Notes: Patient seems confused about dates/times for appointments     Monitor and Manage My Blood Sugar-Diabetes Type 2       Timeframe:  Long-Range Goal Priority:  High Start Date:    07/26/19                         Expected End Date:        11/02/20               Follow Up Date 11/02/20   - check blood sugar at prescribed times - check blood sugar if I feel it is too high or too low - enter blood sugar readings and medication or insulin into daily log - take the blood sugar log to all doctor visits - take the blood sugar meter to all doctor visits  - talk to Dr Daryll Drown about managing my DM    Why is this important?   Checking your blood sugar at home helps to keep it from getting very high or very low.  Writing the results in a diary or log helps the doctor know how to care for you.  Your blood sugar log should have the time, date and the results.  Also, write down the amount of insulin or other medicine that you take.  Other information, like what you ate, exercise done and how you were feeling, will also be helpful.     Notes: meeting A1C target of <8.0% on 03/09/20 with A1C= 7.2% w/o increased incidences of hypoglycemia     Weight (lb) < 200 lb (90.7 kg)       Wt Readings from Last 3 Encounters:  06/22/20 269 lb 6.4 oz (122.2 kg)  05/10/20 274 lb (124.3 kg)  04/24/20 274 lb 11.2 oz (124.6 kg)    Not meeting weight target        The patient verbalized  understanding of instructions, educational materials, and care plan provided today and agreed to receive a mailed copy of patient instructions, educational materials, and care plan.   Telephone follow up appointment with care management team member scheduled for:09/18/20@11 :00  [name], RN, BSN, CCM Care Management Coordinator [facility] Internal Medicine Phone: [phone] / Fax: [phone]

## 2020-08-16 NOTE — Chronic Care Management (AMB) (Signed)
Care Management    RN Visit Note  08/16/2020 Name: [name] [name] MRN: [id number] DOB: [birth year]  Subjective: [name] [name] is a 87 y.o. year old male who is a primary care patient of Inez Catalina, MD. The care management team was consulted for assistance with disease management and care coordination needs.    Engaged with patient by telephone for follow up visit in response to provider referral for case management and/or care coordination services.   Consent to Services:   [name] [name] was given information about Care Management services today including:  Care Management services includes personalized support from designated clinical staff supervised by his physician, including individualized plan of care and coordination with other care providers 24/7 contact phone numbers for assistance for urgent and routine care needs. The patient may stop case management services at any time by phone call to the office staff.  Patient agreed to services and consent obtained.    Assessment: Patient continues to experience difficulty with remembering appointments.. See Care Plan below for interventions and patient self-care actives. Follow up Plan: Patient would like continued follow-up.  CCM RNCM will outreach the patient within the next 30 days.  Patient will call office if needed prior to next encounter : Review of patient past medical history, allergies, medications, health status, including review of consultants reports, laboratory and other test data, was performed as part of comprehensive evaluation and provision of chronic care management services.   SDOH (Social Determinants of Health) assessments and interventions performed:    Care Plan  No Known Allergies  Outpatient Encounter Medications as of 08/16/2020  Medication Sig Note   allopurinol (ZYLOPRIM) 100 MG tablet Take 2 tablets by mouth once daily    amLODipine (NORVASC) 10 MG tablet Take 1 tablet by mouth once daily     aspirin EC 81 MG tablet Take 1 tablet (81 mg total) by mouth daily.    atorvastatin (LIPITOR) 40 MG tablet Take 1 tablet by mouth once daily    Blood Glucose Monitoring Suppl (ONETOUCH VERIO FLEX SYSTEM) w/Device KIT Use OneTouch Verio Flex meter to check blood sugar twice daily.    cilostazol (PLETAL) 50 MG tablet Take 1 tablet by mouth twice daily    colchicine 0.6 MG tablet Take 1 tablet by mouth twice daily    EQ LORATADINE 10 MG tablet TAKE 1 TABLET BY MOUTH DAILY AS NEEDED FOR RHINITIS.    furosemide (LASIX) 20 MG tablet Take 1 tablet by mouth once daily    gabapentin (NEURONTIN) 100 MG capsule TAKE 1 CAPSULE BY MOUTH THREE TIMES DAILY 05/22/2020: BID   insulin NPH Human (HUMULIN N,NOVOLIN N) 100 UNIT/ML injection Inject 20 Units into the skin daily before breakfast. Per Dr Remus Blake OV note of 11/17/19 05/22/2020: 15 units    Insulin Regular Human (NOVOLIN R RELION IJ) Inject 10 Units as directed 2 (two) times daily with a meal. Before  breakfast and supper Per Dr Remus Blake OV of 11/17/19    Insulin Syringe-Needle U-100 31G X 15/64" 0.3 ML MISC Use to inject insulin 3 times a day    losartan (COZAAR) 50 MG tablet Take 1 tablet by mouth once daily    magnesium hydroxide (MILK OF MAGNESIA) 400 MG/5ML suspension Take 5 mLs by mouth daily as needed for moderate constipation.    metFORMIN (GLUCOPHAGE-XR) 500 MG 24 hr tablet TAKE 2 TABLETS BY MOUTH ONCE DAILY WITH SUPPER    metoprolol tartrate (LOPRESSOR) 25 MG tablet Take 1 tablet by mouth twice daily  Misc Natural Products (PROSTATE HEALTH) CAPS Take 1 capsule by mouth daily at 6 (six) AM.    OneTouch Delica Lancets 16F MISC 1 each by Does not apply route 2 (two) times daily. E 11.9 diagnosis    ONETOUCH VERIO test strip USE AS DIRECTED TO CHECK BLOOD TWICE DAILY    oxybutynin (DITROPAN) 5 MG tablet Take 1 tablet (5 mg total) by mouth 3 (three) times daily.    pantoprazole (PROTONIX) 40 MG tablet Take 1 tablet (40 mg total) by mouth 2 (two) times  daily. TAKE 30 MINUTES BEFORE MEALS.    senna-docusate (SENOKOT S) 8.6-50 MG tablet Take 2 tablets by mouth 2 (two) times daily.    tamsulosin (FLOMAX) 0.4 MG CAPS capsule Take 1 capsule by mouth once daily    No facility-administered encounter medications on file as of 08/16/2020.    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, bilateral ([place]) 03/24/2018   Type 2 diabetes mellitus with microalbuminuria, with long-term current use of insulin ([place]) 11/13/2017   Healthcare maintenance 11/13/2017   Chronic left shoulder pain 11/13/2017   Severe obesity (BMI 35.0-35.9 with comorbidity) ([place]) 11/13/2017   Urethral stricture in male 11/13/2017   Benign prostatic hyperplasia (BPH) with urinary urge incontinence 11/13/2017   Chronic combined systolic (congestive) and diastolic (congestive) heart failure ([place]) 10/31/2016   Arthritis 12/19/2014   Coronary artery disease involving native coronary artery of native heart without angina pectoris 09/30/2007   Peripheral arterial disease ([place]) 09/30/2007   Chronic non-seasonal allergic rhinitis 02/17/2006   History of prostate cancer 02/17/2006   Type 2 diabetes mellitus with peripheral neuropathy ([place]) 11/04/2005   Hyperlipidemia 11/04/2005   Essential hypertension 11/04/2005   Gastroesophageal reflux disease with hiatal hernia 11/04/2005    Conditions to be addressed/monitored: CHF, HTN, and DMII  Care Plan : CCM RN- Diabetes Type 2 (Adult)  Updates made by [name], RN since 08/16/2020 12:00 AM     Problem: Glycemic Management (Diabetes, Type 2)   Priority: High     Long-Range Goal: IDDM, HTN, HF, HLD Management Optimized   Start Date: 07/26/2019  Expected End Date: 10/25/2020  Recent  Progress: On track  Priority: High  Note:   CARE PLAN ENTRY (see longitudinal plan of care for additional care plan information)  Current Barriers:  Chronic Disease Management support, education, and care coordination needs related to CHF, CAD, HTN, HLD, and DMII- Successful outreach to patient to discuss his current medical issues. Patient states he did not have his appointment with [facility] Latino as when he arrived for the appointment he was told the doctor was not available and they wanted to reschedule in November. He is not happy and is wondering if when he comes for his appt at the clinic, he can be referred to a different Development worker, international aid.  Clinical Goal(s) related to CHF, CAD, HTN, HLD, and DMII:  Over the next 30 -60 days, patient will:  Work with the care management team to address educational, disease management, and care coordination needs  Begin or continue self health monitoring activities as directed today take medications as directed Call provider office for new or worsened signs and symptoms Blood glucose findings outside established parameters and New or worsened symptom related to CHF, CAD, HTN, HLD, and DMII Call care  management team with questions or concerns Verbalize basic understanding of patient centered plan of care established today  Interventions related to CHF, CAD, HTN, HLD, and DMII:  Evaluation of current treatment plans for HF, CAD, HTN, HLD and DM and patient's adherence to plan as established by provider Assessed patient understanding of disease states Appropriate assessments completed Assessed patient's education and care coordination needs Assessed self monitored CBGs and reviewed targets- Discussed with patient today and he states he takes his blood sugar 2x day (as ordered), he forgot to take this morning but it was 220 yesterday morning.  Assessed frequency of hypoglycemia and hyperglycemia and reviewed treatment for both Advised  patient this CCM RN will  message [name] clinic RD re:  Will investigate securing home BP monitor for patient Reviewed scheduled/upcoming provider appointments including: Appt at clinic on 09/14/20 at 10:15.  Patient Self Care Activities related to CHF, CAD, HTN, HLD, and DMII:  Patient is unable to independently self-manage chronic health conditions - check blood sugar at prescribed times - check blood sugar if I feel it is too high or too low - enter blood sugar readings and medication or insulin into daily log - take the blood sugar log to all doctor visits - take the blood sugar meter to all doctor visits - notify provider of any health concerns       Plan: Telephone follow up appointment with care management team member scheduled for:  39 days  [name], RN, BSN, CCM Care Management Coordinator [facility] Internal Medicine Phone: [phone] / Fax: [phone]

## 2020-08-18 ENCOUNTER — Other Ambulatory Visit: Payer: Self-pay | Admitting: Endocrinology

## 2020-08-20 ENCOUNTER — Telehealth: Payer: Self-pay | Admitting: Physician Assistant

## 2020-08-20 MED ORDER — METOPROLOL TARTRATE 25 MG PO TABS: 25.0000 mg | ORAL_TABLET | Freq: Two times a day (BID) | ORAL | 1 refills | Status: DC

## 2020-08-20 NOTE — Telephone Encounter (Signed)
Rx(s) sent to pharmacy electronically.  Patient notified and voiced understanding.  

## 2020-08-20 NOTE — Telephone Encounter (Signed)
*  STAT* If patient is at the pharmacy, call can be transferred to refill team.   1. Which medications need to be refilled? (please list name of each medication and dose if known) metoprolol tartrate (LOPRESSOR) 25 MG tablet  2. Which pharmacy/location (including street and city if local pharmacy) is medication to be sent to? [place], [address]  3. Do they need a 30 day or 90 day supply? 90 day supply  Pt is out of medication

## 2020-08-21 ENCOUNTER — Other Ambulatory Visit: Payer: Self-pay | Admitting: Internal Medicine

## 2020-08-21 DIAGNOSIS — M109 Gout, unspecified: Secondary | ICD-10-CM

## 2020-08-22 ENCOUNTER — Other Ambulatory Visit: Payer: Self-pay

## 2020-08-22 ENCOUNTER — Other Ambulatory Visit: Payer: Self-pay | Admitting: Endocrinology

## 2020-08-22 DIAGNOSIS — M109 Gout, unspecified: Secondary | ICD-10-CM

## 2020-08-22 NOTE — Telephone Encounter (Signed)
Pt is requesting his losartan (COZAAR) 50 MG tablet,   colchicine 0.6 MG tablet sent to  [facility], [address] Phone:  [phone]  Fax:  [phone]

## 2020-08-22 NOTE — Telephone Encounter (Signed)
Next appt scheduled 8/12 with PCP. 

## 2020-08-23 MED ORDER — COLCHICINE 0.6 MG PO TABS: 0.6000 mg | ORAL_TABLET | Freq: Two times a day (BID) | ORAL | 0 refills | Status: DC

## 2020-08-23 MED ORDER — LOSARTAN POTASSIUM 50 MG PO TABS: 50.0000 mg | ORAL_TABLET | Freq: Every day | ORAL | 0 refills | Status: DC

## 2020-09-14 ENCOUNTER — Encounter: Payer: Self-pay | Admitting: Internal Medicine

## 2020-09-14 ENCOUNTER — Ambulatory Visit (INDEPENDENT_AMBULATORY_CARE_PROVIDER_SITE_OTHER): Payer: Medicare (Managed Care) | Admitting: Internal Medicine

## 2020-09-14 ENCOUNTER — Other Ambulatory Visit: Payer: Self-pay

## 2020-09-14 VITALS — BP 138/74 | HR 69 | Temp 97.7°F | Ht 73.0 in | Wt 265.1 lb

## 2020-09-14 DIAGNOSIS — E7800 Pure hypercholesterolemia, unspecified: Secondary | ICD-10-CM

## 2020-09-14 DIAGNOSIS — K219 Gastro-esophageal reflux disease without esophagitis: Secondary | ICD-10-CM

## 2020-09-14 DIAGNOSIS — J3089 Other allergic rhinitis: Secondary | ICD-10-CM

## 2020-09-14 DIAGNOSIS — M1A9XX0 Chronic gout, unspecified, without tophus (tophi): Secondary | ICD-10-CM

## 2020-09-14 DIAGNOSIS — Z794 Long term (current) use of insulin: Secondary | ICD-10-CM | POA: Diagnosis not present

## 2020-09-14 DIAGNOSIS — I10 Essential (primary) hypertension: Secondary | ICD-10-CM

## 2020-09-14 DIAGNOSIS — N3941 Urge incontinence: Secondary | ICD-10-CM

## 2020-09-14 DIAGNOSIS — E1129 Type 2 diabetes mellitus with other diabetic kidney complication: Secondary | ICD-10-CM | POA: Diagnosis not present

## 2020-09-14 DIAGNOSIS — K5900 Constipation, unspecified: Secondary | ICD-10-CM

## 2020-09-14 DIAGNOSIS — K449 Diaphragmatic hernia without obstruction or gangrene: Secondary | ICD-10-CM

## 2020-09-14 DIAGNOSIS — E113293 Type 2 diabetes mellitus with mild 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, bilateral: Secondary | ICD-10-CM

## 2020-09-14 DIAGNOSIS — R809 Proteinuria, unspecified: Secondary | ICD-10-CM | POA: Diagnosis not present

## 2020-09-14 DIAGNOSIS — Z8546 Personal history of malignant neoplasm of prostate: Secondary | ICD-10-CM

## 2020-09-14 DIAGNOSIS — G3184 Mild cognitive impairment, so stated: Secondary | ICD-10-CM

## 2020-09-14 DIAGNOSIS — N401 Enlarged prostate with lower urinary tract symptoms: Secondary | ICD-10-CM | POA: Diagnosis not present

## 2020-09-14 LAB — POCT GLYCOSYLATED HEMOGLOBIN (HGB A1C): Hemoglobin A1C: 8.6 % — AB (ref 4.0–5.6)

## 2020-09-14 LAB — GLUCOSE, CAPILLARY: Glucose-Capillary: 213 mg/dL — ABNORMAL HIGH (ref 70–99)

## 2020-09-14 MED ORDER — ATORVASTATIN CALCIUM 40 MG PO TABS: 40.0000 mg | ORAL_TABLET | Freq: Every day | ORAL | 3 refills | Status: DC

## 2020-09-14 MED ORDER — INSULIN NPH (HUMAN) (ISOPHANE) 100 UNIT/ML ~~LOC~~ SUSP: 20.0000 [IU] | Freq: Every day | SUBCUTANEOUS | 3 refills | Status: DC

## 2020-09-14 MED ORDER — TAMSULOSIN HCL 0.4 MG PO CAPS
0.4000 mg | ORAL_CAPSULE | Freq: Every day | ORAL | 3 refills | Status: AC
Start: 2020-09-14 — End: ?

## 2020-09-14 MED ORDER — GABAPENTIN 100 MG PO CAPS: 100.0000 mg | ORAL_CAPSULE | Freq: Two times a day (BID) | ORAL | 3 refills | Status: DC

## 2020-09-14 NOTE — Patient Instructions (Signed)
**Note DeWaltersIdentified via Obfuscation** Mr. Por - -  For your urinary symptoms please RESTART Tamsulosin (flomax) daily.    For your blood sugar, please INCREASE the NPH insulin (cloudy) to 20 units in the morning.   You will need to see the GI doctors in the future. Please keep your appointment with [facility].    Come back to see me in 3 months, sooner if needed.

## 2020-09-14 NOTE — Assessment & Plan Note (Signed)
Reports no worsening of symptoms today.  Taking loratadine without issue.   Plan Continue loratadine

## 2020-09-14 NOTE — Assessment & Plan Note (Signed)
Last PSA was undetectable, check next year.

## 2020-09-14 NOTE — Assessment & Plan Note (Signed)
Improved on daily senna.  No pain and having BM every other day.

## 2020-09-14 NOTE — Assessment & Plan Note (Signed)
Reports that this issue is doing well on allopurinol and colchicine.

## 2020-09-14 NOTE — Assessment & Plan Note (Signed)
BP today was well controlled at 138/74.  He is doing well without dizziness or lightheadedness.  He will continue on current therapy which includes amlodipine, losartan, metoprolol.   Plan Continue 3 drug regimen Check BMET today

## 2020-09-14 NOTE — Progress Notes (Signed)
Subjective:    Patient ID: [name] [name], male    DOB: August 26, 1931, 87 y.o.   MRN: [id number]  CC: 3 month follow up for DM  HPI  [name] [name] is an 87 year old man with PMH of DM2, gout, CAD/PAD, HLD, HTN, prostate cancer, BPH and urethral stricture, chronic constipation along with recent increased heartburn and dysphagia who presents for follow up.   [name] [name] has low health literacy.  He apparently was told by someone to decrease his insulin to 15 units of NPH in the morning.  Unfortunately, his A1C is higher today.  I think given his CAD and PAD he would be appropriate for a goal of at least < 8 with concomitant age considerations.  We discussed how he is taking his insulin.  I reviewed his blood sugar log which showed most readings outside of goal.   He notes increased LUTS symptoms.  He is taking oxybutynin.  He does not have his tamsulosin with him.  He does not remember ever taking this medication.  We will refill it today.   For his dysphagia, he notes frustration with [facility].  I encouraged him to follow through with his appointment.  He notes that his constipation is controlled as long as he takes a senna over the counter.  He does not remember taking MOM as recommended at last visit.   Review of Systems  Constitutional:  Negative for activity change, appetite change and fatigue.  Respiratory:  Negative for cough, choking and shortness of breath.   Cardiovascular:  Negative for chest pain and leg swelling.  Gastrointestinal:  Positive for constipation. Negative for abdominal distention, abdominal pain and diarrhea.  Genitourinary:  Positive for enuresis, frequency and urgency. Negative for decreased urine volume and difficulty urinating.  Musculoskeletal:  Negative for arthralgias and back pain.  Neurological:  Negative for dizziness and weakness.  Psychiatric/Behavioral:  Negative for behavioral problems and confusion.       Objective:   Physical Exam Vitals and  nursing note reviewed.  Constitutional:      General: He is not in acute distress.    Appearance: Normal appearance. He is obese. He is not toxic-appearing.  HENT:     Head: Normocephalic and atraumatic.     Mouth/Throat:     Mouth: Mucous membranes are moist.     Pharynx: Oropharynx is clear.  Eyes:     General:        Right eye: No discharge.        Left eye: No discharge.     Conjunctiva/sclera: Conjunctivae normal.  Cardiovascular:     Rate and Rhythm: Normal rate and regular rhythm.     Heart sounds: No murmur heard. Pulmonary:     Effort: Pulmonary effort is normal. No respiratory distress.     Breath sounds: Normal breath sounds. No wheezing.  Abdominal:     General: There is distension.     Palpations: There is no mass.     Tenderness: There is no abdominal tenderness. There is no guarding.  Musculoskeletal:        General: No swelling or tenderness.  Skin:    General: Skin is warm and dry.     Coloration: Skin is not pale.     Findings: No lesion or rash.     Comments: He has no wounds or lesions on feet.  Some thickening of toenails present.   Neurological:     Mental Status: He is alert. Mental status is  at baseline.  Psychiatric:        Mood and Affect: Mood normal.        Behavior: Behavior normal.     A1C 8.6 BMET, CBC, lipid panel, MAU/cr today     Assessment & Plan:  Return in 3 months, sooner if needed.

## 2020-09-14 NOTE — Assessment & Plan Note (Signed)
He notes increasing nocturia.  He is taking his oxybutynin, but does not have the tamsulosin and cannot remember why.   Plan Restart tamsulosin Continue oxybutynin

## 2020-09-14 NOTE — Assessment & Plan Note (Signed)
[name] [name] reports doing well today.  He has no new complaints, however, he is only taking his NPH at 15 units instead of the recommended 20 units.  His A1C has thus increased to 8.2.  He has a previous LDL at 46 and is on a statin which was refilled today.  He has h/o macular edema and peripheral neuropathy.  His last MAU/Cr ratio was < 30 and is due today.   Plan Increase NPH to 20 units in the AM Regular insulin 10 units BID with lunch/dinner Recheck A1C in 3 months Continue statin

## 2020-09-14 NOTE — Assessment & Plan Note (Signed)
He notes recurrence of esophageal pain.  He reports to me that at 87 years old, he developed an esophageal ulcers.  He used home remedies and goat's milk and the symptoms stayed away for over 30 years.  He has been taking his protonix and we reviewed how to take it today.  He also notes dysphagia and abnormal findings on barium swallow.  However, he has had issues with changes to appointments at [facility] and is frustrated.  I advised him to follow up as planned given his dysphagia and history with recurrence of esophageal pain.    Will follow up with him about this plan at next visit.

## 2020-09-14 NOTE — Assessment & Plan Note (Signed)
He certainly also has low medical literacy and gets frustrated with complicated issues.  He is being seen by CCM services and I think this will continue to be useful to him.

## 2020-09-15 LAB — CBC
Hematocrit: 41.6 % (ref 37.5–51.0)
Hemoglobin: 13.9 g/dL (ref 13.0–17.7)
MCH: 31 pg (ref 26.6–33.0)
MCHC: 33.4 g/dL (ref 31.5–35.7)
MCV: 93 fL (ref 79–97)
Platelets: 161 10*3/uL (ref 150–450)
RBC: 4.49 x10E6/uL (ref 4.14–5.80)
RDW: 13.6 % (ref 11.6–15.4)
WBC: 8.4 10*3/uL (ref 3.4–10.8)

## 2020-09-15 LAB — LIPID PANEL
Chol/HDL Ratio: 3.1 ratio (ref 0.0–5.0)
Cholesterol, Total: 108 mg/dL (ref 100–199)
HDL: 35 mg/dL — ABNORMAL LOW (ref >39)
LDL Chol Calc (NIH): 55 mg/dL (ref 0–99)
Triglycerides: 91 mg/dL (ref 0–149)
VLDL Cholesterol Cal: 18 mg/dL (ref 5–40)

## 2020-09-15 LAB — MICROALBUMIN / CREATININE URINE RATIO
Creatinine, Urine: 154.9 mg/dL
Microalb/Creat Ratio: 124 mg/g creat — ABNORMAL HIGH (ref 0–29)
Microalbumin, Urine: 191.4 ug/mL

## 2020-09-15 LAB — BMP8+ANION GAP
Anion Gap: 15 mmol/L (ref 10.0–18.0)
BUN/Creatinine Ratio: 9 — ABNORMAL LOW (ref 10–24)
BUN: 12 mg/dL (ref 8–27)
CO2: 21 mmol/L (ref 20–29)
Calcium: 9 mg/dL (ref 8.6–10.2)
Chloride: 103 mmol/L (ref 96–106)
Creatinine, Ser: 1.33 mg/dL — ABNORMAL HIGH (ref 0.76–1.27)
Glucose: 197 mg/dL — ABNORMAL HIGH (ref 65–99)
Potassium: 3.7 mmol/L (ref 3.5–5.2)
Sodium: 139 mmol/L (ref 134–144)
eGFR: 52 mL/min/{1.73_m2} — ABNORMAL LOW (ref >59)

## 2020-09-18 ENCOUNTER — Ambulatory Visit: Payer: BC Managed Care – PPO

## 2020-09-18 NOTE — Patient Instructions (Signed)
Visit Information   Goals Addressed             This Visit's Progress    Blood Pressure < 130/80        BP Readings from Last 3 Encounters:  09/14/20 138/74  06/22/20 119/65  05/10/20 116/63   Meeting blood pressure targets     HEMOGLOBIN A1C < 8       Lab Results  Component Value Date   HGBA1C 8.6 (A) 09/14/2020   HGBA1C 8.2 (A) 06/22/2020   HGBA1C 7.2 (A) 03/09/2020   Lab Results  Component Value Date   MICROALBUR 3.5 (H) 08/09/2019   LDLCALC 55 09/14/2020   CREATININE 1.33 (H) 09/14/2020     03/09/20- Now meeting target     LDL CALC < 70       Lab Results  Component Value Date   CHOL 108 09/14/2020   HDL 35 (L) 09/14/2020   LDLCALC 55 09/14/2020   TRIG 91 09/14/2020   CHOLHDL 3.1 09/14/2020   Meeting LDL goal     Make and Keep All Appointments       Timeframe:  Long-Range Goal Priority:  High Start Date:             03/12/20                Expected End Date:                    Follow Up Date 11/02/20   - ask family or friend for a ride - call to cancel if needed    Why is this important?   Part of staying healthy is seeing the doctor for follow-up care.  If you forget your appointments, there are some things you can do to stay on track.    Notes: Patient seems confused about dates/times for appointments     Monitor and Manage My Blood Sugar-Diabetes Type 2       Timeframe:  Long-Range Goal Priority:  High Start Date:    07/26/19                         Expected End Date:        11/02/20               Follow Up Date 11/02/20   - check blood sugar at prescribed times - check blood sugar if I feel it is too high or too low - enter blood sugar readings and medication or insulin into daily log - take the blood sugar log to all doctor visits - take the blood sugar meter to all doctor visits  - talk to Dr Daryll Drown about managing my DM    Why is this important?   Checking your blood sugar at home helps to keep it from getting very high or very low.   Writing the results in a diary or log helps the doctor know how to care for you.  Your blood sugar log should have the time, date and the results.  Also, write down the amount of insulin or other medicine that you take.  Other information, like what you ate, exercise done and how you were feeling, will also be helpful.     Notes: Not meeting A1C target of <8.0%      Weight (lb) < 200 lb (90.7 kg)       Wt Readings from Last 3 Encounters:  09/14/20 265 lb 1.6 oz (  120.2 kg)  06/22/20 269 lb 6.4 oz (122.2 kg)  05/10/20 274 lb (124.3 kg)    Not meeting weight target        The patient verbalized understanding of instructions, educational materials, and care plan provided today and declined offer to receive copy of patient instructions, educational materials, and care plan.   Telephone follow up appointment with care management team member scheduled for: 09/28/20'@0930'$ .  [name], RN, BSN, CCM Care Management Coordinator [facility] Phone: [phone] / Fax: [phone]

## 2020-09-18 NOTE — Chronic Care Management (AMB) (Signed)
Care Management    RN Visit Note  09/18/2020 Name: [name] [name] MRN: [id number] DOB: 1932/01/27  Subjective: [name] [name] is a 87 y.o. year old male who is a primary care patient of [name], MD. The care management team was consulted for assistance with disease management and care coordination needs.    Engaged with patient by telephone for follow up visit in response to provider referral for case management and/or care coordination services.   Consent to Services:   [name] [name] was given information about Care Management services today including:  Care Management services includes personalized support from designated clinical staff supervised by his physician, including individualized plan of care and coordination with other care providers 24/7 contact phone numbers for assistance for urgent and routine care needs. The patient may stop case management services at any time by phone call to the office staff.  Patient agreed to services and consent obtained.    Assessment: Patient continues to experience difficulty with understanding his health conditions. See Care Plan below for interventions and patient self-care actives. Follow up Plan: Patient would like continued follow-up.  CCM RNCM will outreach the patient within the next 2 weeks.  Patient will call office if needed prior to next encounter  : Review of patient past medical history, allergies, medications, health status, including review of consultants reports, laboratory and other test data, was performed as part of comprehensive evaluation and provision of chronic care management services.   SDOH (Social Determinants of Health) assessments and interventions performed:    Care Plan  No Known Allergies  Outpatient Encounter Medications as of 09/18/2020  Medication Sig   allopurinol (ZYLOPRIM) 100 MG tablet Take 2 tablets by mouth once daily   amLODipine (NORVASC) 10 MG tablet Take 1 tablet by mouth once daily    aspirin EC 81 MG tablet Take 1 tablet (81 mg total) by mouth daily.   atorvastatin (LIPITOR) 40 MG tablet Take 1 tablet (40 mg total) by mouth daily.   Blood Glucose Monitoring Suppl (ONETOUCH VERIO FLEX SYSTEM) w/Device KIT Use OneTouch Verio Flex meter to check blood sugar twice daily.   colchicine 0.6 MG tablet Take 1 tablet (0.6 mg total) by mouth 2 (two) times daily.   EQ LORATADINE 10 MG tablet TAKE 1 TABLET BY MOUTH DAILY AS NEEDED FOR RHINITIS.   furosemide (LASIX) 20 MG tablet Take 1 tablet by mouth once daily   gabapentin (NEURONTIN) 100 MG capsule Take 1 capsule (100 mg total) by mouth 2 (two) times daily.   insulin NPH Human (NOVOLIN N) 100 UNIT/ML injection Inject 0.2 mLs (20 Units total) into the skin daily before breakfast. Per Dr [name] OV note of 11/17/19   Insulin Regular Human (NOVOLIN R RELION IJ) Inject 10 Units as directed 2 (two) times daily with a meal. Before  breakfast and supper Per Dr [name] OV of 11/17/19   Insulin Syringe-Needle U-100 31G X 15/64" 0.3 ML MISC Use to inject insulin 3 times a day   losartan (COZAAR) 50 MG tablet Take 1 tablet (50 mg total) by mouth daily.   magnesium hydroxide (MILK OF MAGNESIA) 400 MG/5ML suspension Take 5 mLs by mouth daily as needed for moderate constipation.   metFORMIN (GLUCOPHAGE-XR) 500 MG 24 hr tablet TAKE 2 TABLETS BY MOUTH ONCE DAILY WITH SUPPER   metoprolol tartrate (LOPRESSOR) 25 MG tablet Take 1 tablet (25 mg total) by mouth 2 (two) times daily.   Misc Natural Products (PROSTATE HEALTH) CAPS Take 1 capsule by  mouth daily at 6 (six) AM.   OneTouch Delica Lancets 60V MISC 1 each by Does not apply route 2 (two) times daily. E 11.9 diagnosis   ONETOUCH VERIO test strip USE AS DIRECTED TO CHECK BLOOD TWICE DAILY   oxybutynin (DITROPAN) 5 MG tablet Take 1 tablet (5 mg total) by mouth 3 (three) times daily.   pantoprazole (PROTONIX) 40 MG tablet Take 1 tablet (40 mg total) by mouth 2 (two) times daily. TAKE 30 MINUTES BEFORE  MEALS.   senna-docusate (SENOKOT S) 8.6-50 MG tablet Take 2 tablets by mouth 2 (two) times daily.   tamsulosin (FLOMAX) 0.4 MG CAPS capsule Take 1 capsule (0.4 mg total) by mouth daily.   No facility-administered encounter medications on file as of 09/18/2020.    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, bilateral ([place]) 03/24/2018   Type 2 diabetes mellitus with microalbuminuria, with long-term current use of insulin ([place]) 11/13/2017   Healthcare maintenance 11/13/2017   Chronic left shoulder pain 11/13/2017   Severe obesity (BMI 35.0-35.9 with comorbidity) ([place]) 11/13/2017   Urethral stricture in male 11/13/2017   Benign prostatic hyperplasia (BPH) with urinary urge incontinence 11/13/2017   Chronic combined systolic (congestive) and diastolic (congestive) heart failure ([place]) 10/31/2016   Arthritis 12/19/2014   Coronary artery disease involving native coronary artery of native heart without angina pectoris 09/30/2007   Peripheral arterial disease ([place]) 09/30/2007   Chronic non-seasonal allergic rhinitis 02/17/2006   History of prostate cancer 02/17/2006   Type 2 diabetes mellitus with peripheral neuropathy ([place]) 11/04/2005   Hyperlipidemia 11/04/2005   Essential hypertension 11/04/2005   Gastroesophageal reflux disease with hiatal hernia 11/04/2005    Conditions to be addressed/monitored: HTN and DMII  Care Plan : CCM RN- Diabetes Type 2 (Adult)  Updates made by [name], RN since 09/18/2020 12:00 AM     Problem: Glycemic Management (Diabetes, Type 2)   Priority: High     Long-Range Goal: IDDM, HTN, HF, HLD Management Optimized   Start Date: 07/26/2019  Expected End Date: 10/25/2020  Recent Progress: On track  Priority: High   Note:   CARE PLAN ENTRY (see longitudinal plan of care for additional care plan information)  Current Barriers:  Chronic Disease Management support, education, and care coordination needs related to CHF, CAD, HTN, HLD, and DMII- Successful outreach to patient this morning.  Patient concerned that he was told by his pharmacy the copayment on his insulin had increased to $300.  Patient asked this RNCM to contact pharmacy because he had a $140.00 copay up until this prescription.  Call placed to [facility] and was told by the pharmacist the copay for patient's Novalin-N is $146.74.  Collaborated with Marzetta Merino, Pharm D, and Hortencia Pilar, CPhT,  who were looking into a copay card for the patient. Per Hortencia Pilar, The Novocare copay card is only good for: NOVOLOG/ NOVOLOG MIX, FIASP, [place].  Per Rosendo Gros, patient may benefit by buying it OTC at [facility] for $24.88/vial (ReliOn brand).  Called patient back and explained the information shared by the pharmacist at [facility] regarding the cost of his medications.  Patient had difficulty understanding and this RNCM re-educated again.  Encouraged patient to go to the [facility] and discuss his options with the pharmacist.  Patient was  relieved he did not have to pay $300.  Clinical Goal(s) related to CHF, CAD, HTN, HLD, and DMII:  Over the next 30 -60 days, patient will:  Work with the care management team to address educational, disease management, and care coordination needs  Begin or continue self health monitoring activities as directed today take medications as directed Call provider office for new or worsened signs and symptoms Blood glucose findings outside established parameters and New or worsened symptom related to CHF, CAD, HTN, HLD, and DMII Call care management team with questions or concerns Verbalize basic understanding of patient centered plan of care established today  Interventions related to CHF, CAD,  HTN, HLD, and DMII:  Evaluation of current treatment plans for HF, CAD, HTN, HLD and DM and patient's adherence to plan as established by provider Assessed patient understanding of disease states- Patient needs to be re-educated multiple times and has difficulty understanding his medical issues. Appropriate assessments completed Assessed patient's education and care coordination needs Assessed self monitored CBGs and reviewed targets  Assessed frequency of hypoglycemia and hyperglycemia and reviewed treatment for both Reviewed scheduled/upcoming provider appointments   Patient Self Care Activities related to CHF, CAD, HTN, HLD, and DMII:  Patient is unable to independently self-manage chronic health conditions - check blood sugar at prescribed times - check blood sugar if I feel it is too high or too low - enter blood sugar readings and medication or insulin into daily log - take the blood sugar log to all doctor visits - take the blood sugar meter to all doctor visits - notify provider of any health concerns       Plan: Telephone follow up appointment with care management team member scheduled for:  09/28/20_0 .  [name], RN, BSN, CCM Care Management Coordinator [facility] Phone: [phone] / Fax: [phone]

## 2020-09-20 ENCOUNTER — Other Ambulatory Visit: Payer: Self-pay

## 2020-09-21 MED ORDER — LOSARTAN POTASSIUM 50 MG PO TABS: 50.0000 mg | ORAL_TABLET | Freq: Every day | ORAL | 0 refills | Status: DC

## 2020-09-28 ENCOUNTER — Ambulatory Visit: Payer: BC Managed Care – PPO

## 2020-09-28 NOTE — Chronic Care Management (AMB) (Signed)
Care Management    RN Visit Note  09/28/2020 Name: [name] [name] MRN: [id number] DOB: 12-15-1931  Subjective: [name] [name] is a 87 y.o. year old male who is a primary care patient of [name], MD. The care management team was consulted for assistance with disease management and care coordination needs.    Engaged with patient by telephone for follow up visit in response to provider referral for case management and/or care coordination services.   Consent to Services:   [name] was given information about Care Management services today including:  Care Management services includes personalized support from designated clinical staff supervised by his physician, including individualized plan of care and coordination with other care providers 24/7 contact phone numbers for assistance for urgent and routine care needs. The patient may stop case management services at any time by phone call to the office staff.  Patient agreed to services and consent obtained.   Assessment: Review of patient past medical history, allergies, medications, health status, including review of consultants reports, laboratory and other test data, was performed as part of comprehensive evaluation and provision of chronic care management services.   SDOH (Social Determinants of Health) assessments and interventions performed:    Care Plan  No Known Allergies  Outpatient Encounter Medications as of 09/28/2020  Medication Sig   allopurinol (ZYLOPRIM) 100 MG tablet Take 2 tablets by mouth once daily   amLODipine (NORVASC) 10 MG tablet Take 1 tablet by mouth once daily   aspirin EC 81 MG tablet Take 1 tablet (81 mg total) by mouth daily.   atorvastatin (LIPITOR) 40 MG tablet Take 1 tablet (40 mg total) by mouth daily.   Blood Glucose Monitoring Suppl (ONETOUCH VERIO FLEX SYSTEM) w/Device KIT Use OneTouch Verio Flex meter to check blood sugar twice daily.   colchicine 0.6 MG tablet Take 1 tablet  (0.6 mg total) by mouth 2 (two) times daily.   EQ LORATADINE 10 MG tablet TAKE 1 TABLET BY MOUTH DAILY AS NEEDED FOR RHINITIS.   furosemide (LASIX) 20 MG tablet Take 1 tablet by mouth once daily   gabapentin (NEURONTIN) 100 MG capsule Take 1 capsule (100 mg total) by mouth 2 (two) times daily.   insulin NPH Human (NOVOLIN N) 100 UNIT/ML injection Inject 0.2 mLs (20 Units total) into the skin daily before breakfast. Per Dr [name] OV note of 11/17/19   Insulin Regular Human (NOVOLIN R RELION IJ) Inject 10 Units as directed 2 (two) times daily with a meal. Before  breakfast and supper Per Dr [name] OV of 11/17/19   Insulin Syringe-Needle U-100 31G X 15/64" 0.3 ML MISC Use to inject insulin 3 times a day   losartan (COZAAR) 50 MG tablet Take 1 tablet (50 mg total) by mouth daily.   magnesium hydroxide (MILK OF MAGNESIA) 400 MG/5ML suspension Take 5 mLs by mouth daily as needed for moderate constipation.   metFORMIN (GLUCOPHAGE-XR) 500 MG 24 hr tablet TAKE 2 TABLETS BY MOUTH ONCE DAILY WITH SUPPER   metoprolol tartrate (LOPRESSOR) 25 MG tablet Take 1 tablet (25 mg total) by mouth 2 (two) times daily.   Misc Natural Products (PROSTATE HEALTH) CAPS Take 1 capsule by mouth daily at 6 (six) AM.   OneTouch Delica Lancets 40J MISC 1 each by Does not apply route 2 (two) times daily. E 11.9 diagnosis   ONETOUCH VERIO test strip USE AS DIRECTED TO CHECK BLOOD TWICE DAILY   oxybutynin (DITROPAN) 5 MG tablet Take 1 tablet (5 mg total)  by mouth 3 (three) times daily.   pantoprazole (PROTONIX) 40 MG tablet Take 1 tablet (40 mg total) by mouth 2 (two) times daily. TAKE 30 MINUTES BEFORE MEALS.   senna-docusate (SENOKOT S) 8.6-50 MG tablet Take 2 tablets by mouth 2 (two) times daily.   tamsulosin (FLOMAX) 0.4 MG CAPS capsule Take 1 capsule (0.4 mg total) by mouth daily.   No facility-administered encounter medications on file as of 09/28/2020.    Patient Active Problem List   Diagnosis Date Noted   Mild  cognitive impairment 06/25/2020   Esophageal dysphagia 04/24/2020   Chronic gout 05/27/2018   Constipation, unspecified 03/26/2018   Type 2 diabetes mellitus with mild 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, bilateral ([place]) 03/24/2018   Type 2 diabetes mellitus with microalbuminuria, with long-term current use of insulin ([place]) 11/13/2017   Healthcare maintenance 11/13/2017   Chronic left shoulder pain 11/13/2017   Severe obesity (BMI 35.0-35.9 with comorbidity) ([place]) 11/13/2017   Urethral stricture in male 11/13/2017   Benign prostatic hyperplasia (BPH) with urinary urge incontinence 11/13/2017   Chronic combined systolic (congestive) and diastolic (congestive) heart failure ([place]) 10/31/2016   Arthritis 12/19/2014   Coronary artery disease involving native coronary artery of native heart without angina pectoris 09/30/2007   Peripheral arterial disease ([place]) 09/30/2007   Chronic non-seasonal allergic rhinitis 02/17/2006   History of prostate cancer 02/17/2006   Type 2 diabetes mellitus with peripheral neuropathy ([place]) 11/04/2005   Hyperlipidemia 11/04/2005   Essential hypertension 11/04/2005   Gastroesophageal reflux disease with hiatal hernia 11/04/2005    Conditions to be addressed/monitored: CAD, HTN, HLD, and DMII  Care Plan : CCM RN- Diabetes Type 2 (Adult)  Updates made by [name], RN since 09/28/2020 12:00 AM     Problem: Glycemic Management (Diabetes, Type 2)   Priority: High     Long-Range Goal: IDDM, HTN, HF, HLD Management Optimized   Start Date: 07/26/2019  Expected End Date: 10/25/2020  Recent Progress: On track  Priority: High  Note:   CARE PLAN ENTRY (see longitudinal plan of care for additional care plan information)  Current Barriers:  Chronic Disease Management support, education, and care coordination needs related to CHF, CAD, HTN, HLD, and DMII- Successful outreach to patient this morning.  Patient was able to go and purchase  the over the counter insulin at [facility] for $24.88/vial (ReliOn brand).  He was pleased with the cost and stated he may continue to use in the future.  Patient states he is feeling well, having no issues.   Clinical Goal(s) related to CHF, CAD, HTN, HLD, and DMII:  Over the next 30 -60 days, patient will:  Work with the care management team to address educational, disease management, and care coordination needs  Begin or continue self health monitoring activities as directed today take medications as directed Call provider office for new or worsened signs and symptoms Blood glucose findings outside established parameters and New or worsened symptom related to CHF, CAD, HTN, HLD, and DMII Call care management team with questions or concerns Verbalize basic understanding of patient centered plan of care established today  Interventions related to CHF, CAD, HTN, HLD, and DMII:  Evaluation of current treatment plans for HF, CAD, HTN, HLD and DM and patient's adherence to plan as established by provider Assessed patient understanding of disease states- Patient needs to be re-educated multiple times and has difficulty understanding his medical issues. Appropriate assessments completed Assessed patient's education and care coordination needs Assessed self monitored CBGs and  reviewed targets  Assessed frequency of hypoglycemia and hyperglycemia and reviewed treatment for both Reviewed scheduled/upcoming provider appointments   Patient Self Care Activities related to CHF, CAD, HTN, HLD, and DMII:  Patient is unable to independently self-manage chronic health conditions - check blood sugar at prescribed times - check blood sugar if I feel it is too high or too low - enter blood sugar readings and medication or insulin into daily log - take the blood sugar log to all doctor visits - take the blood sugar meter to all doctor visits - notify provider of any health concerns       Plan: Telephone  follow up appointment with care management team member scheduled for:  30 days  [name], RN, BSN, CCM Care Management Coordinator [facility] Internal Medicine Phone: [phone] / Fax: [phone]

## 2020-09-28 NOTE — Patient Instructions (Signed)
Visit Information   Goals Addressed             This Visit's Progress    HEMOGLOBIN A1C < 8       Lab Results  Component Value Date   HGBA1C 8.6 (A) 09/14/2020   HGBA1C 8.2 (A) 06/22/2020   HGBA1C 7.2 (A) 03/09/2020   Lab Results  Component Value Date   MICROALBUR 3.5 (H) 08/09/2019   LDLCALC 55 09/14/2020   CREATININE 1.33 (H) 09/14/2020             The patient verbalized understanding of instructions, educational materials, and care plan provided today and declined offer to receive copy of patient instructions, educational materials, and care plan.   Telephone follow up appointment with care management team member scheduled for: 10/29/20'@09'$ :30.  [name], RN, BSN, CCM Care Management Coordinator [facility] Internal Medicine Phone: [phone] / Fax: [phone]

## 2020-10-02 ENCOUNTER — Other Ambulatory Visit: Payer: Self-pay | Admitting: Internal Medicine

## 2020-10-02 DIAGNOSIS — M109 Gout, unspecified: Secondary | ICD-10-CM

## 2020-10-04 ENCOUNTER — Encounter: Payer: Self-pay | Admitting: Internal Medicine

## 2020-10-11 ENCOUNTER — Other Ambulatory Visit: Payer: Self-pay | Admitting: Cardiovascular Disease

## 2020-10-18 ENCOUNTER — Other Ambulatory Visit: Payer: Self-pay

## 2020-10-18 MED ORDER — LOSARTAN POTASSIUM 50 MG PO TABS: 50.0000 mg | ORAL_TABLET | Freq: Every day | ORAL | 3 refills | Status: DC

## 2020-10-19 ENCOUNTER — Encounter: Payer: Self-pay | Admitting: Internal Medicine

## 2020-10-19 ENCOUNTER — Other Ambulatory Visit: Payer: Self-pay

## 2020-10-19 ENCOUNTER — Ambulatory Visit (INDEPENDENT_AMBULATORY_CARE_PROVIDER_SITE_OTHER): Payer: Medicare (Managed Care) | Admitting: Internal Medicine

## 2020-10-19 VITALS — BP 152/90 | HR 68 | Temp 97.7°F | Resp 32 | Ht 73.0 in | Wt 267.2 lb

## 2020-10-19 DIAGNOSIS — Z794 Long term (current) use of insulin: Secondary | ICD-10-CM

## 2020-10-19 DIAGNOSIS — M1A9XX0 Chronic gout, unspecified, without tophus (tophi): Secondary | ICD-10-CM | POA: Diagnosis not present

## 2020-10-19 DIAGNOSIS — Z0000 Encounter for general adult medical examination without abnormal findings: Secondary | ICD-10-CM | POA: Diagnosis not present

## 2020-10-19 DIAGNOSIS — Z23 Encounter for immunization: Secondary | ICD-10-CM

## 2020-10-19 DIAGNOSIS — E113293 Type 2 diabetes mellitus with mild 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, bilateral: Secondary | ICD-10-CM

## 2020-10-19 NOTE — Progress Notes (Signed)
CC: follow up  HPI:  [name] is a 87 y.o. with medical history as below presenting to [facility] for follow up  Please see problem-based list for further details, assessments, and plans.  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: left internal mammary artery to left anterior descending, saphenous vein graft to obtuse marginal 1, sequential saphenous vein graft to acute marginal and posterior descending   Essential hypertension 11/04/2005   Gastroesophageal reflux disease with hiatal hernia 11/04/2005   History of prostate cancer 02/17/2006   Diagnosed 1995, s/p seed implantation in the late 1990's, PSA undetectable 04/10/2016   Hyperlipidemia 11/04/2005   Obesity (BMI 30.0-34.9) 11/13/2017   Peripheral arterial disease ([place]) 09/30/2007   with claudication, failed attempt at LLE PTCA   Type 2 diabetes mellitus with microalbuminuria, with long-term current use of insulin ([place]) 11/13/2017   Type 2 diabetes mellitus with mild 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, bilateral ([place]) 03/24/2018   Type 2 diabetes mellitus with peripheral neuropathy ([place]) 11/04/2005   Urethral stricture in male 11/13/2017   Noted on cystoscopy 12/18/2014.  Reportedly asked to do chronic intermittent self catheterizations, but has not.   Review of Systems:  Review of system negative unless stated in the problem list or HPI.      Physical Exam:  Vitals:   10/19/20 1000  BP: (!) 152/90  Pulse: 68  Resp: (!) 32  Temp: 97.7 F (36.5 C)  TempSrc: Oral  SpO2: 100%  Weight: 267  lb 3.2 oz (121.2 kg)  Height: '6\' 1"'$  (1.854 m)    Physical Exam Constitutional:      General: He is not in acute distress.    Appearance: He is obese. He is not ill-appearing.  HENT:     Head: Normocephalic and atraumatic.     Right Ear: External ear normal.     Left Ear: External ear normal.     Nose: Nose normal.     Mouth/Throat:     Mouth: Mucous membranes are moist.     Pharynx: Oropharynx is clear. No oropharyngeal exudate or posterior oropharyngeal erythema.  Eyes:     Extraocular Movements: Extraocular movements intact.     Conjunctiva/sclera: Conjunctivae normal.     Pupils: Pupils are equal, round, and reactive to light.  Cardiovascular:     Rate and Rhythm: Normal rate and regular rhythm.     Pulses: Normal pulses.     Heart sounds: Normal heart sounds. No murmur heard.   No friction rub.  Pulmonary:     Effort: Pulmonary effort is normal.     Breath sounds: Normal breath sounds.  Abdominal:     General: Bowel sounds are normal.     Palpations: Abdomen is soft.  Musculoskeletal:        General: No swelling, tenderness, deformity or signs of injury. Normal range of motion.     Cervical back: Normal range of motion and neck supple.     Right lower leg: No edema.     Left lower leg: No edema.  Skin:    Capillary Refill: Capillary refill takes less than 2 seconds.  Coloration: Skin is not jaundiced or pale.     Findings: No erythema, lesion or rash.  Neurological:     General: No focal deficit present.     Mental Status: He is alert and oriented to person, place, and time. Mental status is at baseline.  Psychiatric:        Mood and Affect: Mood normal.        Behavior: Behavior normal.    Assessment & Plan:   See Encounters Tab for problem based charting.  Patient seen with Dr. Roderic Ovens, MD

## 2020-10-19 NOTE — Patient Instructions (Addendum)
Mr.Okie Boeger, it was a pleasure seeing you today!  Today we discussed: You stated you were feeling good. You scheduled this appointment because you had trouble checking your sugars at home because blood wasn't coming out of the fingers. Some of the settings were changed on your lancet and you felt comfortable checking your sugars. You seem to have difficulty with remembering your medications and I believe you will benefit from pill pack. We will have you follow up with the pharmacist to see how we can make this easier for you. You were taking Colchicine everyday but you need to take it only when you are having an attack. We will recheck your uric acid at the next follow up in 2 months. Please schedule an appointment sooner if you need one.  Please bring your meter to the next appointment, so we can check how your sugars are running and adjust accordingly. Flu shot was given today.   I have ordered the following labs today:  Lab Orders  No laboratory test(s) ordered today       Referrals ordered today:   Referral Orders  No referral(s) requested today     I have ordered the following medication/changed the following medications:   Stop the following medications: There are no discontinued medications.   Start the following medications: No orders of the defined types were placed in this encounter.    Follow-up: 2 months   Please make sure to arrive 15 minutes prior to your next appointment. If you arrive late, you may be asked to reschedule.   We look forward to seeing you next time. Please call our clinic at [phone] if you have any questions or concerns. The best time to call is Monday-Friday from 9am-4pm, but there is someone available 24/7. If after hours or the weekend, call the main hospital number and ask for the Internal Medicine Resident On-Call. If you need medication refills, please notify your pharmacy one week in advance and they will send [place] a request.  Thank  you for letting us take part in your care. Wishing you the best!  Thank you, [name], MD

## 2020-10-20 NOTE — Assessment & Plan Note (Signed)
Assessment: Patient scheduled this visit because he was having difficulty checking his blood sugars at home. He stated he could not get blood out of his fingers. Ms. Regino Schultze worked with patient and changed the settings on the lancet to where he felt comfortable checking his blood sugars. Advised him to bring his meter to the next visit so we can adjust his regimen accordingly to prevent hypoglycemia. His previous A1c was elevated which makes me wonder if he is taking his insulin as indicated.  Plan: -Follow up in 2 months with PCP -Follow up with pharmacist visit to see if taking his insulin properly.

## 2020-10-20 NOTE — Assessment & Plan Note (Signed)
Patient received the flu shot today.

## 2020-10-20 NOTE — Assessment & Plan Note (Signed)
Assessment: Patient states his gout is well controlled but he is worried about the cost of his medications. [facility] told him his Colchicine is going up to 200 dollars and he was worried that he will not be able to afford that. He stated he takes this medicine everyday but when looking back at his plan. It stated he should be taking this as needed for attacks. Instructed the patient to take alluprinol daily and colchicine when needed for attack. His last attack was over 9 months ago. We will check uric acid next visit. I believe this patient will benefit from pill pack so the appointment with pharmacist could address that. He was told multiple times what each of his medicine was for but he forgot later on. I wrote the indication on the bottle for him to remember. Explained to him that he can obtain 90 days of Colchicine from [facility] for less than 90 dollars.   Plan: -Follow up in 2 months for uric acid and general follow up -Meet with Pharmacist to discuss ways to optimize compliance

## 2020-10-22 ENCOUNTER — Ambulatory Visit (INDEPENDENT_AMBULATORY_CARE_PROVIDER_SITE_OTHER): Payer: Medicare (Managed Care) | Admitting: Pharmacist

## 2020-10-22 ENCOUNTER — Other Ambulatory Visit (HOSPITAL_COMMUNITY): Payer: Self-pay

## 2020-10-22 DIAGNOSIS — E1129 Type 2 diabetes mellitus with other diabetic kidney complication: Secondary | ICD-10-CM

## 2020-10-22 DIAGNOSIS — Z794 Long term (current) use of insulin: Secondary | ICD-10-CM

## 2020-10-22 DIAGNOSIS — R809 Proteinuria, unspecified: Secondary | ICD-10-CM | POA: Diagnosis not present

## 2020-10-22 DIAGNOSIS — Z79899 Other long term (current) drug therapy: Secondary | ICD-10-CM

## 2020-10-22 MED ORDER — ONETOUCH VERIO FLEX SYSTEM W/DEVICE KIT: PACK | 0 refills | Status: DC

## 2020-10-22 MED ORDER — BASAGLAR KWIKPEN 100 UNIT/ML ~~LOC~~ SOPN
20.0000 [IU] | PEN_INJECTOR | Freq: Two times a day (BID) | SUBCUTANEOUS | 2 refills | Status: DC
  Filled 2020-10-22: qty 12, 30d supply, fill #0

## 2020-10-22 MED ORDER — INSULIN GLARGINE-YFGN 100 UNIT/ML ~~LOC~~ SOPN
20.0000 [IU] | PEN_INJECTOR | Freq: Two times a day (BID) | SUBCUTANEOUS | 1 refills | Status: DC
  Filled 2020-10-22: qty 15, 38d supply, fill #0

## 2020-10-22 MED ORDER — INSULIN GLARGINE-YFGN 100 UNIT/ML ~~LOC~~ SOPN
10.0000 [IU] | PEN_INJECTOR | Freq: Three times a day (TID) | SUBCUTANEOUS | 1 refills | Status: DC
  Filled 2020-10-22: qty 15, 50d supply, fill #0

## 2020-10-22 NOTE — Patient Instructions (Addendum)
Metformin TWO tablets with supper   Tamsulosin ONE capsule ONE TIME A DAY   Oxybutynin ONE tablet THREE TIMES A DAY   Allopurinol  TWO tablets ONE TIME A DAY   Aspirin  ONE tablet ONE TIME A DAY   Amlodipine ONE tablet ONE TIME A DAY   Loratidine ONE tablet ONE TIME A DAY   Losartan ONE tablet ONE TIME A DAY   Atorvastatin  ONE tablet ONE TIME A DAY  Furosemide ONE tablet ONE TIME A DAY   Metoprolol  ONE tablet TWICE A DAY   Pantoprazole ONE tablet 30 minutes BEFORE BREAKFAST AND SUPPER  Gabapentin  ONE capsule TWO TIMES A DAY   Colchicine  ONE tablet AS NEEDED     Mr. Hadsell it was a pleasure seeing you today.   Today we reviewed all of the medications you are currently taking. Included is an updated medication list. Please continue taking all medications as prescribed on this list.  If you have any questions please call the clinic and ask to speak with me.  Follow-up with me in one month

## 2020-10-22 NOTE — Progress Notes (Signed)
   Subjective:    Patient ID: [name] [name], male    DOB: Apr 21, 1931, 88 y.o.   MRN: [id number]  HPI Patient is a 87 y.o. male who presents for diabetes management. He is in good spirits and presents without assistance. Patient was referred and last seen by provider, Dr. Humphrey Rolls, on 10/19/20.  Current diabetes medications include: insulin NPH (novolin N) 20 units BID, insulin regular (Novolin R) 10 units TID with meals, metformin '500mg'$  2 tabs daily  Patient states that He is taking his medications as prescribed. Patient reports adherence with medications, however, appears confused when reviewing.  Do you feel that your medications are working for you?  yes  Have you been experiencing any side effects to the medications prescribed? no  Do you have any problems obtaining medications due to transportation or finances?  yes     Patient denies hypoglycemic events.  Home fasting blood sugars: 137, 252, 220 2 hour post-meal/random blood sugars: 218, 176, 179, 79  Objective:   Labs:   Physical Exam Neurological:     Mental Status: He is alert and oriented to person, place, and time.    Lab Results  Component Value Date   HGBA1C 8.6 (A) 09/14/2020   HGBA1C 8.2 (A) 06/22/2020   HGBA1C 7.2 (A) 03/09/2020    There were no vitals filed for this visit.  Lab Results  Component Value Date   MICRALBCREAT 124 (H) 09/14/2020    Lipid Panel     Component Value Date/Time   CHOL 108 09/14/2020 1115   TRIG 91 09/14/2020 1115   HDL 35 (L) 09/14/2020 1115   CHOLHDL 3.1 09/14/2020 1115   CHOLHDL 3 08/09/2019 1407   VLDL 22.2 08/09/2019 1407   LDLCALC 55 09/14/2020 1115    Assessment/Plan:   T2DM is not controlled likely due to patient having trouble affording medications. Medication adherence appears optimal according to patient. Ideally would like to switch patient's insulin regimen but unable to do so due to cost. Will have patient continue to check blood glucose and will consider  changes at next visit but at this time do not want to add confusion to regimen. Following instruction patient verbalized understanding of treatment plan.     Continued insulin NPH (novolin N) 20 units BID Continued insulin regular (Novolin R) 10 units TID with meals Continued metformin '500mg'$  2 tabs dail Extensively discussed pathophysiology of diabetes, dietary effects on blood sugar control, and recommended lifestyle interventions. Counseled on s/sx of and management of hypoglycemia Next A1C anticipated November 2022.   Patient unable to switch medications to pill packaging services as he would not be able to afford co-pay for all his medications at one time.   Follow-up appointment one month to review sugar readings. Written patient instructions provided.  This appointment required 40 minutes of direct patient care.  Thank you for involving pharmacy to assist in providing this patient's care.

## 2020-10-23 NOTE — Progress Notes (Signed)
Internal Medicine Clinic Attending  I saw and evaluated the patient.  I personally confirmed the key portions of the history and exam documented by Dr. Khan and I reviewed pertinent patient test results.  The assessment, diagnosis, and plan were formulated together and I agree with the documentation in the resident's note.  

## 2020-10-29 ENCOUNTER — Ambulatory Visit: Payer: BC Managed Care – PPO

## 2020-10-29 ENCOUNTER — Telehealth: Payer: Self-pay

## 2020-10-29 NOTE — Patient Instructions (Signed)
Visit Information   Goals Addressed             This Visit's Progress    Blood Pressure < 130/80        BP Readings from Last 3 Encounters:  10/19/20 (!) 152/90  09/14/20 138/74  06/22/20 119/65   Meeting blood pressure targets     Monitor and Manage My Blood Sugar-Diabetes Type 2       Timeframe:  Long-Range Goal Priority:  High Start Date:    07/26/19                         Expected End Date:        11/02/20               Follow Up Date 12/02/20   - check blood sugar at prescribed times - check blood sugar if I feel it is too high or too low - enter blood sugar readings and medication or insulin into daily log - take the blood sugar log to all doctor visits - take the blood sugar meter to all doctor visits  - talk to Dr Daryll Drown about managing my DM    Why is this important?   Checking your blood sugar at home helps to keep it from getting very high or very low.  Writing the results in a diary or log helps the doctor know how to care for you.  Your blood sugar log should have the time, date and the results.  Also, write down the amount of insulin or other medicine that you take.  Other information, like what you ate, exercise done and how you were feeling, will also be helpful.     Notes: Not meeting A1C target of <8.0%      Weight (lb) < 200 lb (90.7 kg)       Wt Readings from Last 3 Encounters:  10/19/20 267 lb 3.2 oz (121.2 kg)  09/14/20 265 lb 1.6 oz (120.2 kg)  06/22/20 269 lb 6.4 oz (122.2 kg)    Not meeting weight target        The patient verbalized understanding of instructions, educational materials, and care plan provided today and declined offer to receive copy of patient instructions, educational materials, and care plan.   Telephone follow up appointment with care management team member scheduled for: 11/26/20@0930 .  [name], RN, BSN, CCM Care Management Coordinator [facility] Internal Medicine Phone: [phone] / Fax: [phone]

## 2020-10-29 NOTE — Chronic Care Management (AMB) (Signed)
Care Management    RN Visit Note  10/29/2020 Name: [name] [name] MRN: [id number] DOB: March 22, 1931  Subjective: [name] [name] is a 87 y.o. year old male who is a primary care patient of [name] Falcon, MD. The care management team was consulted for assistance with disease management and care coordination needs.    Engaged with patient by telephone for follow up visit in response to provider referral for case management and/or care coordination services.   Consent to Services:   [name] was given information about Care Management services today including:  Care Management services includes personalized support from designated clinical staff supervised by his physician, including individualized plan of care and coordination with other care providers 24/7 contact phone numbers for assistance for urgent and routine care needs. The patient may stop case management services at any time by phone call to the office staff.  Patient agreed to services and consent obtained.   Assessment: Review of patient past medical history, allergies, medications, health status, including review of consultants reports, laboratory and other test data, was performed as part of comprehensive evaluation and provision of chronic care management services.   SDOH (Social Determinants of Health) assessments and interventions performed:    Care Plan  No Known Allergies  Outpatient Encounter Medications as of 10/29/2020  Medication Sig   colchicine 0.6 MG tablet Take 1 tablet by mouth twice daily   allopurinol (ZYLOPRIM) 100 MG tablet Take 2 tablets by mouth once daily   amLODipine (NORVASC) 10 MG tablet Take 1 tablet (10 mg total) by mouth daily.   aspirin EC 81 MG tablet Take 1 tablet (81 mg total) by mouth daily.   atorvastatin (LIPITOR) 40 MG tablet Take 1 tablet (40 mg total) by mouth daily.   Blood Glucose Monitoring Suppl (ONETOUCH VERIO FLEX SYSTEM) w/Device KIT Use OneTouch Verio Flex meter to  check blood sugar twice daily.   EQ LORATADINE 10 MG tablet TAKE 1 TABLET BY MOUTH DAILY AS NEEDED FOR RHINITIS. (Patient not taking: Reported on 10/22/2020)   furosemide (LASIX) 20 MG tablet Take 1 tablet by mouth once daily   gabapentin (NEURONTIN) 100 MG capsule Take 1 capsule (100 mg total) by mouth 2 (two) times daily.   insulin NPH Human (NOVOLIN N) 100 UNIT/ML injection Inject 0.2 mLs (20 Units total) into the skin daily before breakfast. Per Dr [name] [name] OV note of 11/17/19   Insulin Regular Human (NOVOLIN R RELION IJ) Inject 10 Units as directed 2 (two) times daily with a meal. Before  breakfast and supper Per Dr [name] [name] OV of 11/17/19   Insulin Syringe-Needle U-100 31G X 15/64" 0.3 ML MISC Use to inject insulin 3 times a day   losartan (COZAAR) 50 MG tablet Take 1 tablet (50 mg total) by mouth daily.   magnesium hydroxide (MILK OF MAGNESIA) 400 MG/5ML suspension Take 5 mLs by mouth daily as needed for moderate constipation.   metFORMIN (GLUCOPHAGE-XR) 500 MG 24 hr tablet TAKE 2 TABLETS BY MOUTH ONCE DAILY WITH SUPPER   metoprolol tartrate (LOPRESSOR) 25 MG tablet Take 1 tablet (25 mg total) by mouth 2 (two) times daily.   Misc Natural Products (PROSTATE HEALTH) CAPS Take 1 capsule by mouth daily at 6 (six) AM.   OneTouch Delica Lancets 22Q MISC 1 each by Does not apply route 2 (two) times daily. E 11.9 diagnosis   ONETOUCH VERIO test strip USE AS DIRECTED TO CHECK BLOOD TWICE DAILY   oxybutynin (DITROPAN) 5 MG tablet Take 1 tablet (  5 mg total) by mouth 3 (three) times daily.   pantoprazole (PROTONIX) 40 MG tablet Take 1 tablet (40 mg total) by mouth 2 (two) times daily. TAKE 30 MINUTES BEFORE MEALS.   senna-docusate (SENOKOT S) 8.6-50 MG tablet Take 2 tablets by mouth 2 (two) times daily.   tamsulosin (FLOMAX) 0.4 MG CAPS capsule Take 1 capsule (0.4 mg total) by mouth daily.   [DISCONTINUED] insulin glargine-yfgn (SEMGLEE) 100 UNIT/ML Pen Inject 20 Units into the skin 2 (two) times daily.  Can you message me [name] [name]) with cost after running this. Patient has issues with affordability. Not sure if this is preferred on plan. (Patient taking differently: Inject 20 Units into the skin 2 (two) times daily.)   No facility-administered encounter medications on file as of 10/29/2020.    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, bilateral ([place]) 03/24/2018   Type 2 diabetes mellitus with microalbuminuria, with long-term current use of insulin ([place]) 11/13/2017   Healthcare maintenance 11/13/2017   Chronic left shoulder pain 11/13/2017   Severe obesity (BMI 35.0-35.9 with comorbidity) ([place]) 11/13/2017   Urethral stricture in male 11/13/2017   Benign prostatic hyperplasia (BPH) with urinary urge incontinence 11/13/2017   Chronic combined systolic (congestive) and diastolic (congestive) heart failure ([place]) 10/31/2016   Arthritis 12/19/2014   Coronary artery disease involving native coronary artery of native heart without angina pectoris 09/30/2007   Peripheral arterial disease ([place]) 09/30/2007   Chronic non-seasonal allergic rhinitis 02/17/2006   History of prostate cancer 02/17/2006   Type 2 diabetes mellitus with peripheral neuropathy ([place]) 11/04/2005   Hyperlipidemia 11/04/2005   Essential hypertension 11/04/2005   Gastroesophageal reflux disease with hiatal hernia 11/04/2005    Conditions to be addressed/monitored: HTN and DMII  Care Plan : CCM RN- Diabetes Type 2 (Adult)  Updates made by [name] Killian, RN since 10/29/2020 12:00 AM     Problem: Glycemic Management (Diabetes, Type 2)   Priority: High     Long-Range Goal: IDDM, HTN, HF, HLD Management Optimized   Start Date: 07/26/2019  Expected End Date: 10/25/2020   Recent Progress: On track  Priority: High  Note:   CARE PLAN ENTRY (see longitudinal plan of care for additional care plan information)  Current Barriers:  Chronic Disease Management support, education, and care coordination needs related to CHF, CAD, HTN, HLD, and DMII- Successful outreach to patient this morning.  Patient concerned he has not had a bowel movement in about a week.  Patient stated he took [name] without success.  This RNCM attempted to discuss over the counter medications for him to try and he noted that he has difficulty reading labels. He requested this RNCM speak to one of the clinic physicians to have them send over a prescription to his pharmacy.  Message sent to clinical resident team to please address patients concern.  Attempted to discuss patients diabetes and his CBG's but he was solely focused on his bowels.  Clinical Goal(s) related to CHF, CAD, HTN, HLD, and DMII:  Over the next 30 -60 days, patient will:  Work with the care management team to address educational, disease management, and care coordination needs  Begin or continue self health monitoring activities as directed today take medications as directed Call provider office for new or worsened signs  and symptoms Blood glucose findings outside established parameters and New or worsened symptom related to CHF, CAD, HTN, HLD, and DMII Call care management team with questions or concerns Verbalize basic understanding of patient centered plan of care established today  Interventions related to CHF, CAD, HTN, HLD, and DMII:  Evaluation of current treatment plans for HF, CAD, HTN, HLD and DM and patient's adherence to plan as established by provider Assessed patient understanding of disease states- Patient needs to be re-educated multiple times and has difficulty understanding his medical issues. Appropriate assessments completed Assessed patient's education and care coordination needs Assessed self  monitored CBGs and reviewed targets  Assessed frequency of hypoglycemia and hyperglycemia and reviewed treatment for both Reviewed scheduled/upcoming provider appointments   Patient Self Care Activities related to CHF, CAD, HTN, HLD, and DMII:  Patient is unable to independently self-manage chronic health conditions - check blood sugar at prescribed times - check blood sugar if I feel it is too high or too low - enter blood sugar readings and medication or insulin into daily log - take the blood sugar log to all doctor visits - take the blood sugar meter to all doctor visits - notify provider of any health concerns       Plan: Telephone follow up appointment with care management team member scheduled for:  30 days  [name] Killian, RN, BSN, CCM Care Management Coordinator [facility] Medicine Phone: [phone] / Fax: [phone]

## 2020-10-29 NOTE — Telephone Encounter (Signed)
  Chronic Care Management   Note  10/29/2020 Name: [name] [name] MRN: [id number] DOB: Jul 07, 1931  Call placed to patient to notify him this RNCM had spoken with Dr. Kae Heller and she is going to review chart and will send a prescription in for his bowels. Patient to follow up with his pharmacy.  [name], RN, BSN, CCM Care Management Coordinator [facility] Internal Medicine Phone: [phone] / Fax: [phone]

## 2020-10-30 ENCOUNTER — Telehealth: Payer: Self-pay

## 2020-10-30 NOTE — Telephone Encounter (Signed)
Received a TC from [place] with [facility] who wanted to verify she had correct medlist on the patient.  Meds verified.  Earnest Bailey states she feels patient does not have a "good grasp/understanding of the medications he is supposed to be taking and feels it might be because of his age".  Patient has an upcoming appt w/ Dr. Lisabeth Devoid on Thursday 11/01/20 and w/ Dr. Georgina Peer on 10/17.   Earnest Bailey was informed of above, will copy Dr. Georgina Peer to see if she could move up patient's med management appt with her.   Thank you, SChaplin, RN,BSN

## 2020-11-01 ENCOUNTER — Encounter: Payer: Self-pay | Admitting: Student

## 2020-11-01 ENCOUNTER — Other Ambulatory Visit: Payer: Self-pay

## 2020-11-01 ENCOUNTER — Ambulatory Visit (INDEPENDENT_AMBULATORY_CARE_PROVIDER_SITE_OTHER): Payer: Medicare (Managed Care) | Admitting: Student

## 2020-11-01 DIAGNOSIS — E1142 Type 2 diabetes mellitus with diabetic polyneuropathy: Secondary | ICD-10-CM | POA: Diagnosis not present

## 2020-11-01 DIAGNOSIS — M109 Gout, unspecified: Secondary | ICD-10-CM

## 2020-11-01 DIAGNOSIS — K5900 Constipation, unspecified: Secondary | ICD-10-CM | POA: Diagnosis not present

## 2020-11-01 MED ORDER — SENNOSIDES-DOCUSATE SODIUM 8.6-50 MG PO TABS: 2.0000 | ORAL_TABLET | Freq: Two times a day (BID) | ORAL | 0 refills | Status: DC

## 2020-11-01 MED ORDER — ALLOPURINOL 100 MG PO TABS: 200.0000 mg | ORAL_TABLET | Freq: Every day | ORAL | 3 refills | Status: DC

## 2020-11-01 MED ORDER — POLYETHYLENE GLYCOL 3350 17 GM/SCOOP PO POWD: 1.0000 | Freq: Once | ORAL | 0 refills | Status: DC

## 2020-11-01 NOTE — Assessment & Plan Note (Signed)
Last A1c was 8.6 in August.  We downloaded his glucose meter today.  Noted that morning blood sugars are averaging around 150.  Is having postprandial highs in the 210s to 250s.  Currently uses NPH Novolin 20 units daily and 10 units twice daily before meals in addition to metformin.  He reports compliance with his medications.  Plan Increase mealtime insulin to 12 units twice daily Continue NPH Novolin 20 units before breakfast Follow-up in about 2 months for repeat A1c

## 2020-11-01 NOTE — Assessment & Plan Note (Signed)
Reports constipation for past 5 days.  Last bowel movement was soft after he took castor oil.  He has been taking bisacodyl and castor oil intermittent as needed for constipation.  Does note that his abdomen feels quite distended and uncomfortable although he denies abdominal pain.  He denies nausea or vomiting.  States his bowel movements used to be more regular when he was on a different medication but cannot recall what it was.  He rarely eats fiber in his diet.  He was given sample fiber supplements in the room today by diabetic coordinator [name].  Discussed the importance of taking medications regularly to help have consistent bowel movements.  He follows with [facility].  He had a CT abdomen pelvisIn April showing moderate stool burden.  Last colonoscopy appears to be in 2006 and has not had a colonoscopy since then.  Encouraged him to follow-up with [facility] for further evaluation of recurrent constipation.  Plan Senna 2 tabs twice daily MiraLAX 17 g daily, discussed he can take doses up to 2 to 3 hours apart until he has a bowel movement Follow-up in 2 weeks if symptoms not improved

## 2020-11-01 NOTE — Patient Instructions (Signed)
It was a pleasure seeing you in clinic. Today we discussed:   Diabetes Increase you meal time insulin from 10 units to 12 units twice a day  Constiaption Take senna 2 tablets twice a day Take Miralax 17 mg dissolved in water every 2-3 hours until you have a bowel movement You can then take miralax daily to help prevent constipation  You can also take the fiber sample to help prevent constipation in the futures   If you have any questions or concerns, please call our clinic at [phone] between 9am-5pm and after hours call [phone] and ask for the internal medicine resident on call. If you feel you are having a medical emergency please call 911.   Thank you, we look forward to helping you remain healthy!

## 2020-11-01 NOTE — Progress Notes (Signed)
CC: Constipation  HPI:  [name] is a 87 y.o. with history of below presents for constipation for the past 5 days.Please refer to problem based charting for further details and assessment and plan of current problem and chronic medical conditions.   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: left internal mammary artery to left anterior descending, saphenous vein graft to obtuse marginal 1, sequential saphenous vein graft to acute marginal and posterior descending   Essential hypertension 11/04/2005   Gastroesophageal reflux disease with hiatal hernia 11/04/2005   History of prostate cancer 02/17/2006   Diagnosed 1995, s/p seed implantation in the late 1990's, PSA undetectable 04/10/2016   Hyperlipidemia 11/04/2005   Obesity (BMI 30.0-34.9) 11/13/2017   Peripheral arterial disease ([place]) 09/30/2007   with claudication, failed attempt at LLE PTCA   Type 2 diabetes mellitus with microalbuminuria, with long-term current use of insulin ([place]) 11/13/2017   Type 2 diabetes mellitus with mild 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, bilateral ([place]) 03/24/2018   Type 2 diabetes mellitus with peripheral neuropathy ([place]) 11/04/2005   Urethral stricture in male 11/13/2017   Noted on cystoscopy 12/18/2014.  Reportedly asked to do chronic intermittent self catheterizations, but has not.   Review of Systems:  negative as per hpi  Physical Exam:  Vitals:   11/01/20 0913  BP: 119/62  Pulse: 70  Temp: 97.7 F (36.5 C)  TempSrc: Oral  SpO2: 100%  Weight: 265 lb  14.4 oz (120.6 kg)  Height: 6' (1.829 m)   Physical Exam Constitutional:      Appearance: Normal appearance.  HENT:     Head: Normocephalic and atraumatic.     Mouth/Throat:     Mouth: Mucous membranes are moist.     Pharynx: Oropharynx is clear.  Eyes:     Extraocular Movements: Extraocular movements intact.     Pupils: Pupils are equal, round, and reactive to light.  Cardiovascular:     Rate and Rhythm: Normal rate and regular rhythm.     Pulses: Normal pulses.     Heart sounds: No murmur heard. Pulmonary:     Effort: Pulmonary effort is normal.     Breath sounds: No rhonchi or rales.  Abdominal:     General: Abdomen is flat. There is distension.     Palpations: Abdomen is soft. There is no mass.     Tenderness: There is no abdominal tenderness. There is no guarding or rebound.     Comments: Hyperactive bowel sounds  Musculoskeletal:     Cervical back: Normal range of motion and neck supple.     Right lower leg: No edema.     Left lower leg: No edema.  Skin:    General: Skin is warm and dry.     Capillary Refill: Capillary refill takes less than 2 seconds.  Neurological:     General: No focal deficit present.     Mental Status: He is alert and oriented to person, place, and time.  Psychiatric:        Mood and Affect: Mood normal.        Behavior: Behavior normal.  Assessment & Plan:   See Encounters Tab for problem based charting.  Patient discussed with Dr. Evette Doffing

## 2020-11-02 MED ORDER — POLYETHYLENE GLYCOL 3350 17 GM/SCOOP PO POWD: 17.0000 g | Freq: Every day | ORAL | 3 refills | Status: DC

## 2020-11-02 NOTE — Addendum Note (Signed)
Addended by: Iona Beard on: 11/02/2020 02:37 PM   Modules accepted: Orders

## 2020-11-02 NOTE — Progress Notes (Signed)
Internal Medicine Clinic Attending ? ?Case discussed with Dr. Liang  At the time of the visit.  We reviewed the resident?s history and exam and pertinent patient test results.  I agree with the assessment, diagnosis, and plan of care documented in the resident?s note. ? ?

## 2020-11-19 ENCOUNTER — Encounter: Payer: BC Managed Care – PPO | Admitting: Pharmacist

## 2020-11-19 ENCOUNTER — Other Ambulatory Visit: Payer: Self-pay | Admitting: Endocrinology

## 2020-11-20 ENCOUNTER — Other Ambulatory Visit: Payer: Self-pay | Admitting: *Deleted

## 2020-11-20 ENCOUNTER — Other Ambulatory Visit: Payer: Self-pay | Admitting: Endocrinology

## 2020-11-20 NOTE — Telephone Encounter (Signed)
Call from pt requesting a refill on Metformin. Stated he does not see Dr Dwyane Dee any longer. Next appt scheduled 12/14/20 with PCP.

## 2020-11-21 MED ORDER — METFORMIN HCL ER 500 MG PO TB24: ORAL_TABLET | ORAL | 1 refills | Status: DC

## 2020-11-21 NOTE — Telephone Encounter (Signed)
Received TC from patient who states he still hasn't received his Metformin.  RN tried to explain to patient that [facility] received refill request yesterday at 3:45 pm, the request was sent to his physician, and the physician has 48 hours to respond to RX refill request.  Patient keeps cutting off RN's conversation, screaming "I need my pills, I called yesterday.  Every time I need a refill this happens.  If I don't have my pills by 3:00 today, I am coming up there and somebody's going to give me my medicine"!  Pt then hangs up on nurse. SChaplin, RN,BSN

## 2020-11-21 NOTE — Telephone Encounter (Signed)
Called pt to inform him of refill but no answer; nor vm to leave a message.

## 2020-11-26 ENCOUNTER — Ambulatory Visit: Payer: BC Managed Care – PPO

## 2020-11-26 NOTE — Chronic Care Management (AMB) (Signed)
Care Management    RN Visit Note  11/26/2020 Name: [name] [name] MRN: [id number] DOB: 04-26-1931  Subjective: [name] [name] is a 87 y.o. year old male who is a primary care patient of [name], MD. The care management team was consulted for assistance with disease management and care coordination needs.    Engaged with patient by telephone for follow up visit in response to provider referral for case management and/or care coordination services.   Consent to Services:   [name] was given information about Care Management services today including:  Care Management services includes personalized support from designated clinical staff supervised by his physician, including individualized plan of care and coordination with other care providers 24/7 contact phone numbers for assistance for urgent and routine care needs. The patient may stop case management services at any time by phone call to the office staff.  Patient agreed to services and consent obtained.   Assessment: Review of patient past medical history, allergies, medications, health status, including review of consultants reports, laboratory and other test data, was performed as part of comprehensive evaluation and provision of chronic care management services.   SDOH (Social Determinants of Health) assessments and interventions performed:    Care Plan  No Known Allergies  Outpatient Encounter Medications as of 11/26/2020  Medication Sig   colchicine 0.6 MG tablet Take 1 tablet by mouth twice daily   allopurinol (ZYLOPRIM) 100 MG tablet Take 2 tablets (200 mg total) by mouth daily.   amLODipine (NORVASC) 10 MG tablet Take 1 tablet (10 mg total) by mouth daily.   aspirin EC 81 MG tablet Take 1 tablet (81 mg total) by mouth daily.   atorvastatin (LIPITOR) 40 MG tablet Take 1 tablet (40 mg total) by mouth daily.   Blood Glucose Monitoring Suppl (ONETOUCH VERIO FLEX SYSTEM) w/Device KIT Use OneTouch Verio  Flex meter to check blood sugar twice daily.   EQ LORATADINE 10 MG tablet TAKE 1 TABLET BY MOUTH DAILY AS NEEDED FOR RHINITIS. (Patient not taking: Reported on 10/22/2020)   furosemide (LASIX) 20 MG tablet Take 1 tablet by mouth once daily   gabapentin (NEURONTIN) 100 MG capsule Take 1 capsule (100 mg total) by mouth 2 (two) times daily.   insulin NPH Human (NOVOLIN N) 100 UNIT/ML injection Inject 0.2 mLs (20 Units total) into the skin daily before breakfast. Per Dr [name] OV note of 11/17/19   Insulin Regular Human (NOVOLIN R RELION IJ) Inject 10 Units as directed 2 (two) times daily with a meal. Before  breakfast and supper Per Dr [name] OV of 11/17/19   Insulin Syringe-Needle U-100 31G X 15/64" 0.3 ML MISC Use to inject insulin 3 times a day   losartan (COZAAR) 50 MG tablet Take 1 tablet (50 mg total) by mouth daily.   magnesium hydroxide (MILK OF MAGNESIA) 400 MG/5ML suspension Take 5 mLs by mouth daily as needed for moderate constipation.   metFORMIN (GLUCOPHAGE-XR) 500 MG 24 hr tablet TAKE 2 TABLETS BY MOUTH ONCE DAILY WITH SUPPER   metoprolol tartrate (LOPRESSOR) 25 MG tablet Take 1 tablet (25 mg total) by mouth 2 (two) times daily.   Misc Natural Products (PROSTATE HEALTH) CAPS Take 1 capsule by mouth daily at 6 (six) AM.   OneTouch Delica Lancets 54G MISC 1 each by Does not apply route 2 (two) times daily. E 11.9 diagnosis   ONETOUCH VERIO test strip USE AS DIRECTED TO CHECK BLOOD TWICE DAILY   oxybutynin (DITROPAN) 5 MG tablet Take  1 tablet (5 mg total) by mouth 3 (three) times daily.   pantoprazole (PROTONIX) 40 MG tablet Take 1 tablet (40 mg total) by mouth 2 (two) times daily. TAKE 30 MINUTES BEFORE MEALS.   polyethylene glycol powder (MIRALAX) 17 GM/SCOOP powder Take 17 g by mouth daily for 200 doses.   senna-docusate (SENOKOT S) 8.6-50 MG tablet Take 2 tablets by mouth 2 (two) times daily.   tamsulosin (FLOMAX) 0.4 MG CAPS capsule Take 1 capsule (0.4 mg total) by mouth daily.    [DISCONTINUED] insulin glargine-yfgn (SEMGLEE) 100 UNIT/ML Pen Inject 20 Units into the skin 2 (two) times daily. Can you message me [name]) with cost after running this. Patient has issues with affordability. Not sure if this is preferred on plan. (Patient taking differently: Inject 20 Units into the skin 2 (two) times daily.)   No facility-administered encounter medications on file as of 11/26/2020.    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, bilateral ([place]) 03/24/2018   Type 2 diabetes mellitus with microalbuminuria, with long-term current use of insulin ([place]) 11/13/2017   Healthcare maintenance 11/13/2017   Chronic left shoulder pain 11/13/2017   Severe obesity (BMI 35.0-35.9 with comorbidity) ([place]) 11/13/2017   Urethral stricture in male 11/13/2017   Benign prostatic hyperplasia (BPH) with urinary urge incontinence 11/13/2017   Chronic combined systolic (congestive) and diastolic (congestive) heart failure ([place]) 10/31/2016   Arthritis 12/19/2014   Coronary artery disease involving native coronary artery of native heart without angina pectoris 09/30/2007   Peripheral arterial disease ([place]) 09/30/2007   Chronic non-seasonal allergic rhinitis 02/17/2006   History of prostate cancer 02/17/2006   Type 2 diabetes mellitus with peripheral neuropathy ([place]) 11/04/2005   Hyperlipidemia 11/04/2005   Essential hypertension 11/04/2005   Gastroesophageal reflux disease with hiatal hernia 11/04/2005    Conditions to be addressed/monitored: CAD, HTN, and DMII  Care Plan : CCM RN- Diabetes Type 2 (Adult)  Updates made by [name], RN since 11/26/2020 12:00 AM     Problem: Glycemic Management (Diabetes, Type 2)   Priority: High      Long-Range Goal: IDDM, HTN, HF, HLD Management Optimized   Start Date: 07/26/2019  Expected End Date: 10/25/2020  Recent Progress: On track  Priority: High  Note:   CARE PLAN ENTRY (see longitudinal plan of care for additional care plan information)  Current Barriers:  Chronic Disease Management support, education, and care coordination needs related to CHF, CAD, HTN, HLD, and DMII- Successful outreach to patient this morning. Patient noted he has difficulty swallowing the [place] pills for his chronic constipation.  He also complains of frequent urination but did not c/o burning or fever.  Educated patient on DM and how you can have frequency with the diagnosis.  Patient has scheduled appointment with Dr. Daryll Drown, his PCP in a few weeks and requested he speak with her prior to that appointment.  Message sent to PCP to inquire about a phone call/visit.  Clinical Goal(s) related to CHF, CAD, HTN, HLD, and DMII:  Over the next 30 -60 days, patient will:  Work with the care management team to address educational, disease management, and care coordination needs  Begin or continue self health monitoring activities as directed today take medications as directed Call provider office for new or worsened signs  and symptoms Blood glucose findings outside established parameters and New or worsened symptom related to CHF, CAD, HTN, HLD, and DMII Call care management team with questions or concerns Verbalize basic understanding of patient centered plan of care established today  Interventions related to CHF, CAD, HTN, HLD, and DMII:  Evaluation of current treatment plans for HF, CAD, HTN, HLD and DM and patient's adherence to plan as established by provider Assessed patient understanding of disease states- Patient needs to be re-educated multiple times and has difficulty understanding his medical issues. Appropriate assessments completed Assessed patient's education and care coordination needs Assessed  self monitored CBGs and reviewed targets-Fasting/Morning blood sugars are running around 150.  Is having postprandial highs in the 210s to 250s. Verified patient is taking the adjusted insulin doses from his September office visit (Mealtime insulin 12U 2x daily and NPH 20U prior to breakfast). Assessed frequency of hypoglycemia and hyperglycemia and reviewed treatment for both Reviewed scheduled/upcoming provider appointments- Dr. Daryll Drown on 12/14/20$RemoveBeforeDE'@10'[id number]$ :15.  Patient Self Care Activities related to CHF, CAD, HTN, HLD, and DMII:  Patient is unable to independently self-manage chronic health conditions - check blood sugar at prescribed times - check blood sugar if I feel it is too high or too low - enter blood sugar readings and medication or insulin into daily log - take the blood sugar log to all doctor visits - take the blood sugar meter to all doctor visits - notify provider of any health concerns       Plan: Telephone follow up appointment with care management team member scheduled for:  12 days  [name], RN, BSN, CCM Care Management Coordinator [facility] Phone: [phone] / Fax: [phone]

## 2020-11-26 NOTE — Patient Instructions (Signed)
Visit Information   Goals Addressed             This Visit's Progress    Blood Pressure < 130/80        BP Readings from Last 3 Encounters:  11/01/20 119/62  10/19/20 (!) 152/90  09/14/20 138/74   Meeting blood pressure targets     HEMOGLOBIN A1C < 8       Lab Results  Component Value Date   HGBA1C 8.6 (A) 09/14/2020   HGBA1C 8.2 (A) 06/22/2020   HGBA1C 7.2 (A) 03/09/2020   Lab Results  Component Value Date   MICROALBUR 3.5 (H) 08/09/2019   LDLCALC 55 09/14/2020   CREATININE 1.33 (H) 09/14/2020          Make and Keep All Appointments       Timeframe:  Long-Range Goal Priority:  High Start Date:             03/12/20                Expected End Date:                    Follow Up Date 01/02/21   - ask family or friend for a ride - call to cancel if needed    Why is this important?   Part of staying healthy is seeing the doctor for follow-up care.  If you forget your appointments, there are some things you can do to stay on track.    Notes: Patient seems confused about dates/times for appointments     Monitor and Manage My Blood Sugar-Diabetes Type 2       Timeframe:  Long-Range Goal Priority:  High Start Date:    07/26/19                         Expected End Date:        11/02/20               Follow Up Date 01/02/21   - check blood sugar at prescribed times - check blood sugar if I feel it is too high or too low - enter blood sugar readings and medication or insulin into daily log - take the blood sugar log to all doctor visits - take the blood sugar meter to all doctor visits  - talk to Dr Daryll Drown about managing my DM    Why is this important?   Checking your blood sugar at home helps to keep it from getting very high or very low.  Writing the results in a diary or log helps the doctor know how to care for you.  Your blood sugar log should have the time, date and the results.  Also, write down the amount of insulin or other medicine that you take.   Other information, like what you ate, exercise done and how you were feeling, will also be helpful.     Notes: Not meeting A1C target of <8.0%      Weight (lb) < 200 lb (90.7 kg)       Wt Readings from Last 3 Encounters:  11/01/20 265 lb 14.4 oz (120.6 kg)  10/19/20 267 lb 3.2 oz (121.2 kg)  09/14/20 265 lb 1.6 oz (120.2 kg)    Not meeting weight target        The patient verbalized understanding of instructions, educational materials, and care plan provided today and declined offer to receive copy of patient  instructions, educational materials, and care plan.   Telephone follow up appointment with care management team member scheduled for: 12/31/20@0900   [name], RN, BSN, CCM Care Management Coordinator [facility] Phone: [phone] / Fax: [phone]

## 2020-12-14 ENCOUNTER — Encounter: Payer: Self-pay | Admitting: Internal Medicine

## 2020-12-14 ENCOUNTER — Ambulatory Visit (INDEPENDENT_AMBULATORY_CARE_PROVIDER_SITE_OTHER): Payer: Medicare (Managed Care) | Admitting: Internal Medicine

## 2020-12-14 ENCOUNTER — Other Ambulatory Visit: Payer: Self-pay

## 2020-12-14 VITALS — BP 135/58 | HR 57 | Temp 98.3°F | Ht 73.0 in | Wt 262.4 lb

## 2020-12-14 DIAGNOSIS — R809 Proteinuria, unspecified: Secondary | ICD-10-CM

## 2020-12-14 DIAGNOSIS — Z794 Long term (current) use of insulin: Secondary | ICD-10-CM

## 2020-12-14 DIAGNOSIS — K5900 Constipation, unspecified: Secondary | ICD-10-CM | POA: Diagnosis not present

## 2020-12-14 DIAGNOSIS — I10 Essential (primary) hypertension: Secondary | ICD-10-CM | POA: Diagnosis not present

## 2020-12-14 DIAGNOSIS — E1129 Type 2 diabetes mellitus with other diabetic kidney complication: Secondary | ICD-10-CM | POA: Diagnosis not present

## 2020-12-14 DIAGNOSIS — E1142 Type 2 diabetes mellitus with diabetic polyneuropathy: Secondary | ICD-10-CM

## 2020-12-14 LAB — GLUCOSE, CAPILLARY: Glucose-Capillary: 134 mg/dL — ABNORMAL HIGH (ref 70–99)

## 2020-12-14 LAB — POCT GLYCOSYLATED HEMOGLOBIN (HGB A1C): Hemoglobin A1C: 7.6 % — AB (ref 4.0–5.6)

## 2020-12-14 MED ORDER — LACTULOSE 10 GM/15ML PO SOLN: 10.0000 g | Freq: Every day | ORAL | 2 refills | Status: DC

## 2020-12-14 NOTE — Assessment & Plan Note (Signed)
This is the main issue we addressed today.    I would like to get records from Dr. Kathline Magic office with the pathology report, which I cannot see.  [name] [name] brought in his colonoscopy report which showed a rectal ulcer, possibly stercoral ulcer.  He will need a good bowel regimen.   I advised him to do the following:  Increase water intake (moderately given CHF) Increase fiber intake Take metamucil or benefiber daily for bulk forming Prescription for Lactulose daily since miralax did not work for him Continue senna until having formed bowel movements and improvement in symptoms then stop  Will follow up in 1 month to see how he is doing.

## 2020-12-14 NOTE — Progress Notes (Signed)
   Subjective:    Patient ID: [name] [name], male    DOB: [birth year], 87 y.o.   MRN: [id number]  3 month follow up for DM2  HPI  [name] [name] is an 87 year old man with PMH Of DM2, HTN, PAD, CAD s/p CABG in 2014 who presents for follow up.  [name] [name] has been struggling with constipation, bloating and abdominal pain for a while.  He brought in the results from his colonoscopy where he was found to have a rectal ulcer and stercoral ulceration.  He was recommended to take daily colace and PRN senna.  He was seen here in the clinic and recommended to take daily miralax.  He notes none of these interventions have worked well and he is now taking ex-lax (senna) twice a day and having a bowel movement which is soft in the evening.  However, his other symptoms are not yet improved.  We had a long discussion about attempts to increase water and fiber and he notes that he is not drinking much or any water during the day and he realizes this may be part of the problem.   He has also not tried psyllium or other bulk forming laxatives.    He further reports that he has no other symptoms that bother him.  He has no pain in his feet or chest.  He takes his medications as prescribed.  He is concerned that some of his medications such as tamsulosin and lasix may be contributing to his constipation.   Review of Systems  Constitutional:  Negative for activity change, appetite change, chills and fatigue.      Objective:   Physical Exam        Assessment & Plan:

## 2020-12-14 NOTE — Patient Instructions (Addendum)
For your constipation:   Please increase fluid intake (water) to 8 oz at least 4 times per day.   2. Trial going to the bathroom twice per day, 30 minutes after a meal.  Do not strain for > 5 min  3. Increase fiber intake to 20-25 grams per day  4. Please start taking Metamucil or Benefiber in a glass of water once a day in the morning  5. I will prescribe you Lactulose to take daily (10 grams daily in the morning)  6. You can also continue taking senna daily (Ex lax) if this works for you.   All of these interventions should continue daily until you are having 1 soft-normal consistency stool per day regularly.  Once this is occurring or you develop diarrhea, please stop the SENNA.  If you continue having diarrhea or more frequent bowel movements, stop the LACTULOSE.    Please come back to see me in 1 month so see how things are going.   I will get records from [facility] to see what your biopsy results were.   Thank you!

## 2020-12-14 NOTE — Assessment & Plan Note (Signed)
BP today was well controlled at 135/58. He reports no dizziness.  He occasionally has "discomfort" at the top of his chest, he points at his throat.  He notes that it just comes and goes.  Does not seem cardiac at this time.   Plan Continue amlodipine, losartan Check BMET today.

## 2020-12-14 NOTE — Assessment & Plan Note (Signed)
He reports taking his medication as prescribed.  He is taking NPH in the AM and novolin with 2 meals. He brought in his meter which looks acceptable at this time with controlled blood sugars.  His A1C is 7.6 which is improved and at goal given age > 73.   Plan Continue metformin and insulin Follow up in 1 month for constipation.   He has CKD, check BMET Today.  He is on an ARB and BP is relatively well controlled.

## 2020-12-15 LAB — BMP8+ANION GAP
Anion Gap: 14 mmol/L (ref 10.0–18.0)
BUN/Creatinine Ratio: 10 (ref 10–24)
BUN: 12 mg/dL (ref 8–27)
CO2: 22 mmol/L (ref 20–29)
Calcium: 8.9 mg/dL (ref 8.6–10.2)
Chloride: 107 mmol/L — ABNORMAL HIGH (ref 96–106)
Creatinine, Ser: 1.24 mg/dL (ref 0.76–1.27)
Glucose: 110 mg/dL — ABNORMAL HIGH (ref 70–99)
Potassium: 3.8 mmol/L (ref 3.5–5.2)
Sodium: 143 mmol/L (ref 134–144)
eGFR: 56 mL/min/{1.73_m2} — ABNORMAL LOW (ref >59)

## 2020-12-19 DIAGNOSIS — I5020 Unspecified systolic (congestive) heart failure: Secondary | ICD-10-CM | POA: Insufficient documentation

## 2020-12-19 NOTE — Progress Notes (Signed)
Cardiology Office Note:    Date:  12/21/2020   ID:  [name] [name], DOB 1931/06/14, MRN [id number]  PCP:  [name], MD   [facility] Providers Cardiologist:  Mertie Moores, MD    Referring MD: [name], MD   Chief Complaint:  Follow-up for CAD, CHF    Patient Profile:   [name] [name] is a 87 y.o. male with:  Coronary artery disease  S/p PCI S/p CABG in 12/14 Myoview 3/17: inf, inf-lat scar w/ mild peri-infarct ischemia, EF 42; intermediate risk (c/w prior hx)  HFmrEF (heart failure with mildly reduced ejection fraction)        Echocardiogram 3/17: EF 45-50          Peripheral arterial disease  Occluded L SFA; hx of attempted PTA - unsuccessful  Diabetes mellitus  Hypertension  Hyperlipidemia  Prostate CA  GERD   History of Present Illness: [name] [name] was last seen by Dr. Acie Fredrickson in 10/21.  He returns for f/u.  He is here alone.  He has not had any chest discomfort.  He does get short of breath from time to time.  This is overall stable without significant change.  He has not had orthopnea, leg edema, syncope.  He does have stable claudication symptoms without significant change.      ASSESSMENT & PLAN:   Coronary artery disease involving native coronary artery of native heart without angina pectoris History of remote PCI and CABG in 2014.  Myoview in 2017 demonstrated scar with mild peri-infarct ischemia.  He has been continued on medical therapy.  He is doing well without anginal symptoms.  Continue amlodipine 10 mg daily, aspirin 81 mg daily, atorvastatin 40 mg daily, metoprolol tartrate 25 mg twice daily.  Essential hypertension Blood pressure is well controlled.  Continue amlodipine 10 mg daily, losartan 50 mg daily, metoprolol tartrate 25 mg twice daily.  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) Echocardiogram in 3/17 demonstrated EF 45-50 with moderate diastolic dysfunction.  Ischemic cardiomyopathy.  NYHA IIb-III.  Volume status  currently stable.  I do not think his blood pressure would tolerate the addition of spironolactone.  Continue furosemide 20 mg daily, losartan 50 mg daily, metoprolol tartrate 25 mg twice daily.  Peripheral arterial disease (HCC) History of left SFA occlusion status post unsuccessful attempted PTA.  He has chronic, stable claudication symptoms without significant change.  Continue aspirin 81 mg daily, atorvastatin 40 mg daily.  Hyperlipidemia Recent LDL optimal at 55.  Continue atorvastatin 40 mg daily.            Dispo:  Return in about 1 year (around 12/21/2021) for Routine Follow Up, w/ Dr. Acie Fredrickson.    Prior CV studies: ABIs 12/08/2018 R 0.60; L 0.73  Echocardiogram 05/03/15 Mild focal basal septal LVH, EF 45-50, inf-lat and inf HK, Gr 2 DD, mild MR, mod LAE, mildly reduced RVSF, PASP 37  Myoview 04/25/15 Inf, Inf-Latscar with minmal peri-infarct ischemia, EF 42; intermediate risk   Pre CABG Dopplers 01/03/13 Bilat ICA 1-39      Past Medical History:  Diagnosis Date   Benign prostatic hyperplasia (BPH) with urinary urge incontinence 11/13/2017   Chronic left shoulder pain 11/13/2017   Chronic non-seasonal allergic rhinitis 08/09/2374   Chronic systolic heart failure ([place]) 10/31/2016   Echo (05/03/2015): LVEF 28-31%, grade 2 diastolic dysfunction   Coronary artery disease involving native coronary artery of native heart without angina pectoris 09/30/2007   s/p CABG on 01/05/2013: left internal mammary artery to left  anterior descending, saphenous vein graft to obtuse marginal 1, sequential saphenous vein graft to acute marginal and posterior descending   Essential hypertension 11/04/2005   Gastroesophageal reflux disease with hiatal hernia 11/04/2005   History of prostate cancer 02/17/2006   Diagnosed 1995, s/p seed implantation in the late 1990's, PSA undetectable 04/10/2016   Hyperlipidemia 11/04/2005   Obesity (BMI 30.0-34.9) 11/13/2017   Peripheral arterial disease ([place]) 09/30/2007    with claudication, failed attempt at LLE PTCA   Type 2 diabetes mellitus with microalbuminuria, with long-term current use of insulin ([place]) 11/13/2017   Type 2 diabetes mellitus with mild 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, bilateral ([place]) 03/24/2018   Type 2 diabetes mellitus with peripheral neuropathy ([place]) 11/04/2005   Urethral stricture in male 11/13/2017   Noted on cystoscopy 12/18/2014.  Reportedly asked to do chronic intermittent self catheterizations, but has not.   Current Medications: Current Meds  Medication Sig   allopurinol (ZYLOPRIM) 100 MG tablet Take 2 tablets (200 mg total) by mouth daily.   amLODipine (NORVASC) 10 MG tablet Take 1 tablet (10 mg total) by mouth daily.   aspirin EC 81 MG tablet Take 1 tablet (81 mg total) by mouth daily.   atorvastatin (LIPITOR) 40 MG tablet Take 1 tablet (40 mg total) by mouth daily.   Blood Glucose Monitoring Suppl (ONETOUCH VERIO FLEX SYSTEM) w/Device KIT Use OneTouch Verio Flex meter to check blood sugar twice daily.   colchicine 0.6 MG tablet Take 1 tablet by mouth twice daily   EQ LORATADINE 10 MG tablet TAKE 1 TABLET BY MOUTH DAILY AS NEEDED FOR RHINITIS.   furosemide (LASIX) 20 MG tablet Take 1 tablet by mouth once daily   gabapentin (NEURONTIN) 100 MG capsule Take 1 capsule (100 mg total) by mouth 2 (two) times daily.   insulin NPH Human (NOVOLIN N) 100 UNIT/ML injection Inject 0.2 mLs (20 Units total) into the skin daily before breakfast. Per Dr [name] OV note of 11/17/19   Insulin Regular Human (NOVOLIN R RELION IJ) Inject 10 Units as directed 2 (two) times daily with a meal. Before  breakfast and supper Per Dr [name] OV of 11/17/19   Insulin Syringe-Needle U-100 31G X 15/64" 0.3 ML MISC Use to inject insulin 3 times a day   lactulose (CHRONULAC) 10 GM/15ML solution Take 15 mLs (10 g total) by mouth daily.   losartan (COZAAR) 50 MG tablet Take 1 tablet (50 mg total) by mouth daily.   metFORMIN  (GLUCOPHAGE-XR) 500 MG 24 hr tablet TAKE 2 TABLETS BY MOUTH ONCE DAILY WITH SUPPER   metoprolol tartrate (LOPRESSOR) 25 MG tablet Take 1 tablet (25 mg total) by mouth 2 (two) times daily.   Misc Natural Products (PROSTATE HEALTH) CAPS Take 1 capsule by mouth daily at 6 (six) AM.   OneTouch Delica Lancets 16X MISC 1 each by Does not apply route 2 (two) times daily. E 11.9 diagnosis   ONETOUCH VERIO test strip USE AS DIRECTED TO CHECK BLOOD TWICE DAILY   oxybutynin (DITROPAN) 5 MG tablet Take 1 tablet (5 mg total) by mouth 3 (three) times daily.   pantoprazole (PROTONIX) 40 MG tablet Take 1 tablet (40 mg total) by mouth 2 (two) times daily. TAKE 30 MINUTES BEFORE MEALS.   senna-docusate (SENOKOT S) 8.6-50 MG tablet Take 2 tablets by mouth 2 (two) times daily.   tamsulosin (FLOMAX) 0.4 MG CAPS capsule Take 1 capsule (0.4 mg total) by mouth daily.    Allergies:   Patient has no known  allergies.   Social History   Tobacco Use   Smoking status: Former    Packs/day: 0.75    Years: 20.00    Pack years: 15.00    Types: Cigarettes    Quit date: 07/08/1980    Years since quitting: 40.4   Smokeless tobacco: Never  Vaping Use   Vaping Use: Never used  Substance Use Topics   Alcohol use: No    Alcohol/week: 0.0 standard drinks    Comment: 12/07/2012 "quit drinking alcohol > 25 yr ago"   Drug use: No    Family Hx: The patient's family history includes Healthy in his daughter, daughter, daughter, and son; Hypertension in his father; Kidney failure in his mother; Other in his brother, brother, brother, brother, brother, brother, brother, brother, and sister; Stroke in his father.  Review of Systems  Gastrointestinal:  Negative for hematochezia.  Genitourinary:  Negative for hematuria.    EKGs/Labs/Other Test Reviewed:    EKG:  EKG is  ordered today.  The ekg ordered today demonstrates NSR, HR 85, normal axis, nonspecific ST-T wave changes, PACs, QTC 473  Recent Labs: 03/09/2020: ALT  19 04/24/2020: TSH 2.820 09/14/2020: Hemoglobin 13.9; Platelets 161 12/14/2020: BUN 12; Creatinine, Ser 1.24; Potassium 3.8; Sodium 143   Recent Lipid Panel Lab Results  Component Value Date/Time   CHOL 108 09/14/2020 11:15 AM   TRIG 91 09/14/2020 11:15 AM   HDL 35 (L) 09/14/2020 11:15 AM   LDLCALC 55 09/14/2020 11:15 AM     Risk Assessment/Calculations:          Physical Exam:    VS:  BP 104/62   Pulse 85   Ht $R'6\' 1"'Fc$  (1.854 m)   Wt 260 lb 6.4 oz (118.1 kg)   SpO2 98%   BMI 34.36 kg/m     Wt Readings from Last 3 Encounters:  12/21/20 260 lb 6.4 oz (118.1 kg)  12/14/20 262 lb 6.4 oz (119 kg)  11/01/20 265 lb 14.4 oz (120.6 kg)    Constitutional:      Appearance: Healthy appearance. Not in distress.  Neck:     Vascular: No JVR. JVD normal.  Pulmonary:     Effort: Pulmonary effort is normal.     Breath sounds: No wheezing. No rales.  Cardiovascular:     Normal rate. Irregular rhythm. Normal S1. Normal S2.      Murmurs: There is no murmur.  Edema:    Peripheral edema absent.  Abdominal:     Palpations: Abdomen is soft.  Skin:    General: Skin is warm and dry.  Neurological:     General: No focal deficit present.     Mental Status: Alert and oriented to person, place and time.     Cranial Nerves: Cranial nerves are intact.       Medication Adjustments/Labs and Tests Ordered: Current medicines are reviewed at length with the patient today.  Concerns regarding medicines are outlined above.  Tests Ordered: Orders Placed This Encounter  Procedures   EKG 12-Lead   Medication Changes: No orders of the defined types were placed in this encounter.  Signed, Richardson Dopp, PA-C  12/21/2020 9:00 AM    [facility] [address], [place], [place]  [address] Phone: [phone]; Fax: [phone]

## 2020-12-21 ENCOUNTER — Encounter: Payer: Self-pay | Admitting: Physician Assistant

## 2020-12-21 ENCOUNTER — Ambulatory Visit (INDEPENDENT_AMBULATORY_CARE_PROVIDER_SITE_OTHER): Payer: Medicare (Managed Care) | Admitting: Physician Assistant

## 2020-12-21 ENCOUNTER — Other Ambulatory Visit: Payer: Self-pay

## 2020-12-21 ENCOUNTER — Encounter: Payer: BC Managed Care – PPO | Admitting: Internal Medicine

## 2020-12-21 VITALS — BP 104/62 | HR 85 | Ht 73.0 in | Wt 260.4 lb

## 2020-12-21 DIAGNOSIS — I5020 Unspecified systolic (congestive) heart failure: Secondary | ICD-10-CM

## 2020-12-21 DIAGNOSIS — E7800 Pure hypercholesterolemia, unspecified: Secondary | ICD-10-CM

## 2020-12-21 DIAGNOSIS — I739 Peripheral vascular disease, unspecified: Secondary | ICD-10-CM

## 2020-12-21 DIAGNOSIS — E113293 Type 2 diabetes mellitus with mild 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, bilateral: Secondary | ICD-10-CM

## 2020-12-21 DIAGNOSIS — I2510 Atherosclerotic heart disease of native coronary artery without angina pectoris: Secondary | ICD-10-CM | POA: Diagnosis not present

## 2020-12-21 DIAGNOSIS — I10 Essential (primary) hypertension: Secondary | ICD-10-CM | POA: Diagnosis not present

## 2020-12-21 NOTE — Patient Instructions (Signed)
Medication Instructions:   Your physician recommends that you continue on your current medications as directed. Please refer to the Current Medication list given to you today.  *If you need a refill on your cardiac medications before your next appointment, please call your pharmacy*   Lab Work:  NONE  If you have labs (blood work) drawn today and your tests are completely normal, you will receive your results only by: [place] (if you have MyChart) OR A paper copy in the mail If you have any lab test that is abnormal or we need to change your treatment, we will call you to review the results.   Testing/Procedures:  NONE   Follow-Up: At [facility], you and your health needs are our priority.  As part of our continuing mission to provide you with exceptional heart care, we have created designated Provider Care Teams.  These Care Teams include your primary Cardiologist (physician) and Advanced Practice Providers (APPs -  Physician Assistants and Nurse Practitioners) who all work together to provide you with the care you need, when you need it.  We recommend signing up for the patient portal called "MyChart".  Sign up information is provided on this After Visit Summary.  MyChart is used to connect with patients for Virtual Visits (Telemedicine).  Patients are able to view lab/test results, encounter notes, upcoming appointments, etc.  Non-urgent messages can be sent to your provider as well.   To learn more about what you can do with MyChart, go to [email or web address].    Your next appointment:   1 year(s)  The format for your next appointment:   In Person  Provider:   Mertie Moores, MD     Other Instructions  Your physician wants you to follow-up in: 1 year with Dr. Acie Fredrickson.  You will receive a reminder letter in the mail two months in advance. If you don't receive a letter, please call our office to schedule the follow-up appointment.  When it is time to refill  your medication please call [phone] and ask for the refill department:  This is the medications you get from the cardiology office.  Norvasc Lipitor Lasix  Losartan  Metoprolol

## 2020-12-21 NOTE — Assessment & Plan Note (Signed)
History of remote PCI and CABG in 2014.  Myoview in 2017 demonstrated scar with mild peri-infarct ischemia.  He has been continued on medical therapy.  He is doing well without anginal symptoms.  Continue amlodipine 10 mg daily, aspirin 81 mg daily, atorvastatin 40 mg daily, metoprolol tartrate 25 mg twice daily.

## 2020-12-21 NOTE — Assessment & Plan Note (Signed)
History of left SFA occlusion status post unsuccessful attempted PTA.  He has chronic, stable claudication symptoms without significant change.  Continue aspirin 81 mg daily, atorvastatin 40 mg daily.

## 2020-12-21 NOTE — Assessment & Plan Note (Signed)
Echocardiogram in 3/17 demonstrated EF 45-50 with moderate diastolic dysfunction.  Ischemic cardiomyopathy.  NYHA IIb-III.  Volume status currently stable.  I do not think his blood pressure would tolerate the addition of spironolactone.  Continue furosemide 20 mg daily, losartan 50 mg daily, metoprolol tartrate 25 mg twice daily.

## 2020-12-21 NOTE — Assessment & Plan Note (Signed)
Blood pressure is well controlled.  Continue amlodipine 10 mg daily, losartan 50 mg daily, metoprolol tartrate 25 mg twice daily.

## 2020-12-21 NOTE — Assessment & Plan Note (Signed)
Recent LDL optimal at 55.  Continue atorvastatin 40 mg daily.

## 2020-12-31 ENCOUNTER — Ambulatory Visit: Payer: BC Managed Care – PPO

## 2020-12-31 NOTE — Chronic Care Management (AMB) (Signed)
Care Management    RN Visit Note  12/31/2020 Name: [name] [name] MRN: [id number] DOB: 02-13-1931  Subjective: [name] [name] is a 87 y.o. year old male who is a primary care patient of [name] Falcon, MD. The care management team was consulted for assistance with disease management and care coordination needs.    Engaged with patient by telephone for follow up visit in response to provider referral for case management and/or care coordination services.   Consent to Services:   [name] [name] was given information about Care Management services today including:  Care Management services includes personalized support from designated clinical staff supervised by his physician, including individualized plan of care and coordination with other care providers 24/7 contact phone numbers for assistance for urgent and routine care needs. The patient may stop case management services at any time by phone call to the office staff.  Patient agreed to services and consent obtained.   Assessment: Review of patient past medical history, allergies, medications, health status, including review of consultants reports, laboratory and other test data, was performed as part of comprehensive evaluation and provision of chronic care management services.   SDOH (Social Determinants of Health) assessments and interventions performed:    Care Plan  No Known Allergies  Outpatient Encounter Medications as of 12/31/2020  Medication Sig   allopurinol (ZYLOPRIM) 100 MG tablet Take 2 tablets (200 mg total) by mouth daily.   amLODipine (NORVASC) 10 MG tablet Take 1 tablet (10 mg total) by mouth daily.   aspirin EC 81 MG tablet Take 1 tablet (81 mg total) by mouth daily.   atorvastatin (LIPITOR) 40 MG tablet Take 1 tablet (40 mg total) by mouth daily.   Blood Glucose Monitoring Suppl (ONETOUCH VERIO FLEX SYSTEM) w/Device KIT Use OneTouch Verio Flex meter to check blood sugar twice daily.   colchicine 0.6 MG  tablet Take 1 tablet by mouth twice daily   EQ LORATADINE 10 MG tablet TAKE 1 TABLET BY MOUTH DAILY AS NEEDED FOR RHINITIS.   furosemide (LASIX) 20 MG tablet Take 1 tablet by mouth once daily   gabapentin (NEURONTIN) 100 MG capsule Take 1 capsule (100 mg total) by mouth 2 (two) times daily.   insulin NPH Human (NOVOLIN N) 100 UNIT/ML injection Inject 0.2 mLs (20 Units total) into the skin daily before breakfast. Per Dr [name] [name] OV note of 11/17/19   Insulin Regular Human (NOVOLIN R RELION IJ) Inject 10 Units as directed 2 (two) times daily with a meal. Before  breakfast and supper Per Dr [name] [name] OV of 11/17/19   Insulin Syringe-Needle U-100 31G X 15/64" 0.3 ML MISC Use to inject insulin 3 times a day   lactulose (CHRONULAC) 10 GM/15ML solution Take 15 mLs (10 g total) by mouth daily.   losartan (COZAAR) 50 MG tablet Take 1 tablet (50 mg total) by mouth daily.   metFORMIN (GLUCOPHAGE-XR) 500 MG 24 hr tablet TAKE 2 TABLETS BY MOUTH ONCE DAILY WITH SUPPER   metoprolol tartrate (LOPRESSOR) 25 MG tablet Take 1 tablet (25 mg total) by mouth 2 (two) times daily.   Misc Natural Products (PROSTATE HEALTH) CAPS Take 1 capsule by mouth daily at 6 (six) AM.   OneTouch Delica Lancets 47S MISC 1 each by Does not apply route 2 (two) times daily. E 11.9 diagnosis   ONETOUCH VERIO test strip USE AS DIRECTED TO CHECK BLOOD TWICE DAILY   oxybutynin (DITROPAN) 5 MG tablet Take 1 tablet (5 mg total) by mouth 3 (three) times daily.  pantoprazole (PROTONIX) 40 MG tablet Take 1 tablet (40 mg total) by mouth 2 (two) times daily. TAKE 30 MINUTES BEFORE MEALS.   senna-docusate (SENOKOT S) 8.6-50 MG tablet Take 2 tablets by mouth 2 (two) times daily.   tamsulosin (FLOMAX) 0.4 MG CAPS capsule Take 1 capsule (0.4 mg total) by mouth daily.   [DISCONTINUED] insulin glargine-yfgn (SEMGLEE) 100 UNIT/ML Pen Inject 20 Units into the skin 2 (two) times daily. Can you message me [name] [name]) with cost after running this. Patient  has issues with affordability. Not sure if this is preferred on plan. (Patient taking differently: Inject 20 Units into the skin 2 (two) times daily.)   No facility-administered encounter medications on file as of 12/31/2020.    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) 12/19/2020   Mild cognitive impairment 06/25/2020   Esophageal dysphagia 04/24/2020   Chronic gout 05/27/2018   Constipation, unspecified 03/26/2018   Type 2 diabetes mellitus with mild 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, bilateral ([place]) 03/24/2018   Type 2 diabetes mellitus with microalbuminuria, with long-term current use of insulin ([place]) 11/13/2017   Healthcare maintenance 11/13/2017   Chronic left shoulder pain 11/13/2017   Severe obesity (BMI 35.0-35.9 with comorbidity) ([place]) 11/13/2017   Urethral stricture in male 11/13/2017   Benign prostatic hyperplasia (BPH) with urinary urge incontinence 11/13/2017   Chronic combined systolic (congestive) and diastolic (congestive) heart failure ([place]) 10/31/2016   Arthritis 12/19/2014   Coronary artery disease involving native coronary artery of native heart without angina pectoris 09/30/2007   Peripheral arterial disease ([place]) 09/30/2007   Chronic non-seasonal allergic rhinitis 02/17/2006   History of prostate cancer 02/17/2006   Type 2 diabetes mellitus with peripheral neuropathy ([place]) 11/04/2005   Hyperlipidemia 11/04/2005   Essential hypertension 11/04/2005   Gastroesophageal reflux disease with hiatal hernia 11/04/2005    Conditions to be addressed/monitored: CHF, CAD, HTN, HLD, DMII, and Chronic cons  Care Plan : CCM RN- Diabetes Type 2 (Adult)  Updates made by [name] Killian, RN since 12/31/2020 12:00 AM     Problem: Glycemic Management (Diabetes, Type 2)   Priority: High     Long-Range Goal: IDDM, HTN, HF, HLD Management Optimized   Start Date: 07/26/2019  Recent Progress: On  track  Priority: High  Note:   CARE PLAN ENTRY (see longitudinal plan of care for additional care plan information) Current Barriers: Sussessful outreach to patient this morning.  Discussed patients continued concern about his constipation.  We discussed his medications that were prescribed at last office visit with Dr. Daryll Drown.  He continues to complain about being constipated. He is taking Lactulose, but he was not taking the correct dosage.  He was taking 5ML, not 73ml as ordered.  Reinforced the need to take the correct dose and this RNCM repeated the dose several times.  He is taking this medication with water to increase his intake.   He is taking increased fiber with Metamucil.  Patient is concerned his Ditropan and Allopurinol are causing his constipation and requested I let his physician know as she may want to change medications.  Informed the patient I would let him know if there are any changes prior to his 01/11/21 appointment. Knowledge Deficits related to plan of care for management of CHF, CAD, HTN, DMII, and Chronic constipation  Chronic Disease Management support and education needs related to CHF, CAD, HTN, HLD, DMII, and Chronic constipation  Financial Constraints  Literacy barriers  Non-adherence to prescribed medication regimen Cognitive Deficits  RNCM Clinical Goal(s):  Patient will verbalize understanding of plan for management of CHF, CAD, HTN, DMII, and Chronic constipation as evidenced by correct medication management. verbalize basic understanding of  CHF, CAD, HTN, HLD, DMII, and Chronic Constipation disease process and self health management plan as evidenced by improvement in symptoms take all medications exactly as prescribed and will call provider for medication related questions as evidenced by repeating dosage and use of medications. continue to work with RN Care Manager to address care management and care coordination needs related to  CHF, CAD, HTN, HLD, DMII, and  Chronic constipation as evidenced by adherence to CM Team Scheduled appointments demonstrate a decrease in CHF, CAD, HTN, HLD, DMII, and Chronic constipation exacerbations as evidenced by actively participating in program  through collaboration with RN Care manager, provider, and care team.   Interventions: 1:1 collaboration with primary care provider regarding development and update of comprehensive plan of care as evidenced by provider attestation and co-signature Inter-disciplinary care team collaboration (see longitudinal plan of care) Evaluation of current treatment plan related to  self management and patient's adherence to plan as established by provider   CAD Interventions: (Status:  Gaol Not Met.) Short Term Goal Assessed understanding of CAD diagnosis Medications reviewed including medications utilized in CAD treatment plan Provided education on importance of blood pressure control in management of CAD Counseled on importance of regular laboratory monitoring as prescribed Reviewed Importance of taking all medications as prescribed Reviewed Importance of attending all scheduled provider appointments Advised to report any changes in symptoms or exercise tolerance   Heart Failure Interventions:  (Status:  Goal on track:  Yes.) Long Term Goal Basic overview and discussion of pathophysiology of Heart Failure reviewed Provided education on low sodium diet Assessed need for readable accurate scales in home Advised patient to weigh each morning after emptying bladder Discussed importance of daily weight and advised patient to weigh and record daily Discussed the importance of keeping all appointments with provider  Diabetes Interventions:  (Status:  Goal on track:  Yes.) Long Term Goal Assessed patient's understanding of A1c goal: <8% Provided education to patient about basic DM disease process Reviewed medications with patient and discussed importance of medication  adherence Counseled on importance of regular laboratory monitoring as prescribed Discussed plans with patient for ongoing care management follow up and provided patient with direct contact information for care management team Reviewed scheduled/upcoming provider appointments including: follow up with MD on 01/11/21 Lab Results  Component Value Date   HGBA1C 7.6 (A) 12/14/2020   Patient Goals/Self-Care Activities: Take all medications as prescribed Attend all scheduled provider appointments Call pharmacy for medication refills 3-7 days in advance of running out of medications Perform all self care activities independently  Call provider office for new concerns or questions   Follow Up Plan:  Telephone follow up appointment with care management team member scheduled for:  01/10/21    Patient Self Care Activities related to CHF, CAD, HTN, HLD, and DMII:  Patient is unable to independently self-manage chronic health conditions - check blood sugar at prescribed times - check blood sugar if I feel it is too high or too low - enter blood sugar readings and medication or insulin into daily log - take the blood sugar log to all doctor visits - take the blood sugar meter to all doctor visits - notify provider of any health concerns       Plan: Telephone follow up appointment  with care management team member scheduled for:  01/10/21$RemoveBefo'@0900'[id number]$   [name] Killian, RN, BSN, CCM Care Management Coordinator [facility] Internal Medicine Phone: [phone]: [phone]

## 2020-12-31 NOTE — Patient Instructions (Signed)
Visit Information  Thank you for taking time to visit with me today. Please don't hesitate to contact me if I can be of assistance to you before our next scheduled telephone appointment.  Our next appointment is by telephone on 01/10/21 at 0900.  Please call the care guide team at [phone] if you need to cancel or reschedule your appointment.   Please call the [place] National Suicide Prevention Lifeline: 437-671-4695 or TTY: (713) 526-9722 TTY (402) [phone]) to talk to a trained counselor if you are experiencing a [place] or [place] or need someone to talk to.  The patient verbalized understanding of instructions, educational materials, and care plan provided today and declined offer to receive copy of patient instructions, educational materials, and care plan.   [name], RN, BSN, CCM Care Management Coordinator [facility] Internal Medicine Phone: [phone]: [phone]

## 2021-01-07 ENCOUNTER — Telehealth: Payer: Medicare (Managed Care)

## 2021-01-10 ENCOUNTER — Telehealth: Payer: Medicare (Managed Care)

## 2021-01-11 ENCOUNTER — Other Ambulatory Visit: Payer: Self-pay

## 2021-01-11 ENCOUNTER — Ambulatory Visit (INDEPENDENT_AMBULATORY_CARE_PROVIDER_SITE_OTHER): Payer: Medicare (Managed Care) | Admitting: Internal Medicine

## 2021-01-11 ENCOUNTER — Encounter: Payer: Self-pay | Admitting: Internal Medicine

## 2021-01-11 VITALS — BP 120/58 | HR 55 | Temp 98.2°F | Ht 73.0 in | Wt 259.3 lb

## 2021-01-11 DIAGNOSIS — I10 Essential (primary) hypertension: Secondary | ICD-10-CM | POA: Diagnosis not present

## 2021-01-11 DIAGNOSIS — Z8546 Personal history of malignant neoplasm of prostate: Secondary | ICD-10-CM

## 2021-01-11 DIAGNOSIS — N35919 Unspecified urethral stricture, male, unspecified site: Secondary | ICD-10-CM

## 2021-01-11 DIAGNOSIS — E1142 Type 2 diabetes mellitus with diabetic polyneuropathy: Secondary | ICD-10-CM | POA: Diagnosis not present

## 2021-01-11 DIAGNOSIS — N401 Enlarged prostate with lower urinary tract symptoms: Secondary | ICD-10-CM | POA: Diagnosis not present

## 2021-01-11 DIAGNOSIS — K5900 Constipation, unspecified: Secondary | ICD-10-CM

## 2021-01-11 DIAGNOSIS — N3941 Urge incontinence: Secondary | ICD-10-CM

## 2021-01-11 MED ORDER — LACTULOSE 10 GM/15ML PO SOLN: 10.0000 g | Freq: Every day | ORAL | 2 refills | Status: DC

## 2021-01-11 NOTE — Patient Instructions (Signed)
Thank you, [name] for allowing [place] to provide your care today. Today we discussed:  Constipation: - Stop taking the Ditropan medication today. - Continue taking the lactulose.  Urination: - Please see a urologist about your frequent urination, especially at night.   Referrals ordered today:  Urology Referral  I have ordered the following medication/changed the following medications:   Stop the following medications: Medications Discontinued During This Encounter  Medication Reason   oxybutynin (DITROPAN) 5 MG tablet      Follow up:  6-8 weeks    Remember: Should you have any questions or concerns please call the internal medicine clinic at [phone].

## 2021-01-11 NOTE — Assessment & Plan Note (Addendum)
This is an ongoing problem for him, but he notes some improvement today. He reports taking his medication every other day and has soft BMs every other day with no straining. Reports no diarrhea. He also complains of diffuse abdominal pain that wraps around to his lower back, with unclear etiology. No CVA tenderness bilaterally, and it is unclear if this is caused/worsened by chronic constipation, due to inability to empty bladder, or related to history of prostate cancer. He reported lactulose was helpful, so will reorder this today. - Continue Senokot - Continue Lactulose - reiterated to him that he will need to increase fiber intake and take medications daily. I am concerned that he is not emptying his bladder which is causing him discomfort in the abdomen.  We discussed stopping his oxybutynin given this medication can worsen constipation.

## 2021-01-11 NOTE — Assessment & Plan Note (Signed)
Reports urgency, frequency, straining, and occasionally seeing blood in his urine. Also reports lower back pain of unknown etiology. Discussed need for him to see a urologist for further evaluation and referral placed.

## 2021-01-11 NOTE — Assessment & Plan Note (Signed)
Last PSA <0.1 but notes frequency, straining, nocturia, and seeing blood in urine today. Encouraged him to see a urologist and referral placed.

## 2021-01-11 NOTE — Assessment & Plan Note (Signed)
Reports urgency, frequency, straining, and occasionally seeing blood in his urine, as well as nocturia up to 10x nightly. Due to ongoing constipation, will discontinue oxybutynin at this time, but continue tamsulosin, though unclear if he is currently taking this.

## 2021-01-11 NOTE — Progress Notes (Signed)
  Subjective:     Patient ID: [name], male   DOB: 08/01/1931, 87 y.o.   MRN: [id number]  Jaquawn Saffran is an 87 y/o who presents to clinic for routine follow-up. Today his main complaint is abdominal pain that wraps around to his lower back. He is concerned that this pain may be related to his kidneys. The pain has been occurring for the past 3-4 months  Back Pain    Review of Systems  Genitourinary:  Positive for difficulty urinating, frequency and urgency.       Mentions occasionally seeing flecks of blood in his urine.  Musculoskeletal:  Positive for back pain.      Objective:   Physical Exam Constitutional:      Appearance: Normal appearance.  HENT:     Head: Normocephalic and atraumatic.  Cardiovascular:     Rate and Rhythm: Normal rate and regular rhythm.     Heart sounds: Normal heart sounds.  Pulmonary:     Effort: Pulmonary effort is normal.     Breath sounds: Normal breath sounds.  Abdominal:     General: Abdomen is flat.     Tenderness: There is no abdominal tenderness. There is no right CVA tenderness or left CVA tenderness.  Musculoskeletal:     Right lower leg: No edema.     Left lower leg: No edema.  Skin:    General: Skin is warm and dry.  Neurological:     Mental Status: He is alert.       Assessment:

## 2021-01-11 NOTE — Assessment & Plan Note (Signed)
BP 120/58 today. Well-controlled on current medications.  - Continue amlodipine, losartan, metoprolol tartrate.

## 2021-01-15 NOTE — Progress Notes (Signed)
Attestation for Student Documentation:  I personally was present and performed or re-performed the history, physical exam and medical decision-making activities of this service and have verified that the service and findings are accurately documented in the student's note.  [name], MD 01/15/2021, 6:54 AM

## 2021-01-16 ENCOUNTER — Ambulatory Visit: Payer: Medicare (Managed Care)

## 2021-01-16 NOTE — Patient Instructions (Signed)
Visit Information  Thank you for taking time to visit with me today. Please don't hesitate to contact me if I can be of assistance to you before our next scheduled telephone appointment.  Our next appointment is by telephone on 02/20/21 at 0930.  Please call the care guide team at [phone] if you need to cancel or reschedule your appointment.   If you are experiencing a Mental Health or [place] or need someone to talk to, please call the [place] National Suicide Prevention Lifeline: 6281514802 or TTY: (780)699-7850 TTY 9590715468) to talk to a trained counselor   The patient verbalized understanding of instructions, educational materials, and care plan provided today and declined offer to receive copy of patient instructions, educational materials, and care plan.   The patient has been provided with contact information for the care management team and has been advised to call with any health related questions or concerns.   [name], RN, BSN, CCM Care Management Coordinator [facility] Internal Medicine Phone: [phone]: [phone]

## 2021-01-16 NOTE — Chronic Care Management (AMB) (Signed)
Care Management    RN Visit Note  01/16/2021 Name: [name] [name] MRN: [id number] DOB: [birth year]  Subjective: [name] [name] is a 87 y.o. year old male who is a primary care patient of [name] Falcon, MD. The care management team was consulted for assistance with disease management and care coordination needs.    Engaged with patient by telephone for follow up visit in response to provider referral for case management and/or care coordination services.   Consent to Services:   [name] was given information about Care Management services today including:  Care Management services includes personalized support from designated clinical staff supervised by his physician, including individualized plan of care and coordination with other care providers 24/7 contact phone numbers for assistance for urgent and routine care needs. The patient may stop case management services at any time by phone call to the office staff.  Patient agreed to services and consent obtained.   Assessment: Review of patient past medical history, allergies, medications, health status, including review of consultants reports, laboratory and other test data, was performed as part of comprehensive evaluation and provision of chronic care management services.   SDOH (Social Determinants of Health) assessments and interventions performed:    Care Plan  No Known Allergies  Outpatient Encounter Medications as of 01/16/2021  Medication Sig   allopurinol (ZYLOPRIM) 100 MG tablet Take 2 tablets (200 mg total) by mouth daily.   amLODipine (NORVASC) 10 MG tablet Take 1 tablet (10 mg total) by mouth daily.   aspirin EC 81 MG tablet Take 1 tablet (81 mg total) by mouth daily.   atorvastatin (LIPITOR) 40 MG tablet Take 1 tablet (40 mg total) by mouth daily.   Blood Glucose Monitoring Suppl (ONETOUCH VERIO FLEX SYSTEM) w/Device KIT Use OneTouch Verio Flex meter to check blood sugar twice daily.   colchicine 0.6 MG  tablet Take 1 tablet by mouth twice daily   EQ LORATADINE 10 MG tablet TAKE 1 TABLET BY MOUTH DAILY AS NEEDED FOR RHINITIS.   furosemide (LASIX) 20 MG tablet Take 1 tablet by mouth once daily   gabapentin (NEURONTIN) 100 MG capsule Take 1 capsule (100 mg total) by mouth 2 (two) times daily.   insulin NPH Human (NOVOLIN N) 100 UNIT/ML injection Inject 0.2 mLs (20 Units total) into the skin daily before breakfast. Per Dr [name] OV note of 11/17/19   Insulin Regular Human (NOVOLIN R RELION IJ) Inject 10 Units as directed 2 (two) times daily with a meal. Before  breakfast and supper Per Dr [name] OV of 11/17/19   Insulin Syringe-Needle U-100 31G X 15/64" 0.3 ML MISC Use to inject insulin 3 times a day   lactulose (CHRONULAC) 10 GM/15ML solution Take 15 mLs (10 g total) by mouth daily.   losartan (COZAAR) 50 MG tablet Take 1 tablet (50 mg total) by mouth daily.   metFORMIN (GLUCOPHAGE-XR) 500 MG 24 hr tablet TAKE 2 TABLETS BY MOUTH ONCE DAILY WITH SUPPER   metoprolol tartrate (LOPRESSOR) 25 MG tablet Take 1 tablet (25 mg total) by mouth 2 (two) times daily.   Misc Natural Products (PROSTATE HEALTH) CAPS Take 1 capsule by mouth daily at 6 (six) AM.   OneTouch Delica Lancets 20U MISC 1 each by Does not apply route 2 (two) times daily. E 11.9 diagnosis   ONETOUCH VERIO test strip USE AS DIRECTED TO CHECK BLOOD TWICE DAILY   pantoprazole (PROTONIX) 40 MG tablet Take 1 tablet (40 mg total) by mouth 2 (two) times  daily. TAKE 30 MINUTES BEFORE MEALS.   senna-docusate (SENOKOT S) 8.6-50 MG tablet Take 2 tablets by mouth 2 (two) times daily.   tamsulosin (FLOMAX) 0.4 MG CAPS capsule Take 1 capsule (0.4 mg total) by mouth daily.   [DISCONTINUED] insulin glargine-yfgn (SEMGLEE) 100 UNIT/ML Pen Inject 20 Units into the skin 2 (two) times daily. Can you message me [name]) with cost after running this. Patient has issues with affordability. Not sure if this is preferred on plan. (Patient taking  differently: Inject 20 Units into the skin 2 (two) times daily.)   No facility-administered encounter medications on file as of 01/16/2021.    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) 12/19/2020   Mild cognitive impairment 06/25/2020   Esophageal dysphagia 04/24/2020   Chronic gout 05/27/2018   Constipation, unspecified 03/26/2018   Type 2 diabetes mellitus with mild 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, bilateral ([place]) 03/24/2018   Type 2 diabetes mellitus with microalbuminuria, with long-term current use of insulin ([place]) 11/13/2017   Healthcare maintenance 11/13/2017   Chronic left shoulder pain 11/13/2017   Severe obesity (BMI 35.0-35.9 with comorbidity) ([place]) 11/13/2017   Urethral stricture in male 11/13/2017   Benign prostatic hyperplasia (BPH) with urinary urge incontinence 11/13/2017   Chronic combined systolic (congestive) and diastolic (congestive) heart failure ([place]) 10/31/2016   Arthritis 12/19/2014   Coronary artery disease involving native coronary artery of native heart without angina pectoris 09/30/2007   Peripheral arterial disease ([place]) 09/30/2007   Chronic non-seasonal allergic rhinitis 02/17/2006   History of prostate cancer 02/17/2006   Type 2 diabetes mellitus with peripheral neuropathy ([place]) 11/04/2005   Hyperlipidemia 11/04/2005   Essential hypertension 11/04/2005   Gastroesophageal reflux disease with hiatal hernia 11/04/2005    Conditions to be addressed/monitored: CHF, Hypertriglyceridemia, and DMII  Care Plan : CCM RN- Diabetes Type 2 (Adult)  Updates made by [name], RN since 01/16/2021 12:00 AM     Problem: Glycemic Management (Diabetes, Type 2)   Priority: High     Long-Range Goal: IDDM, HTN, HF, HLD Management Optimized   Start Date: 07/26/2019  Recent Progress: On track  Priority: High  Note:   CARE PLAN ENTRY (see longitudinal plan of care for  additional care plan information) Current Barriers: Sussessful outreach to patient this morning.  Patient was recently seen in office for abdominal pain and difficulty with urination.  Patient notes that he is feeling better with no pain this morning.  He asked about the referral that was made to urology and he has not heard about it.  Explained our scheduler has been working on getting him an appointment.  [facility] does not take his insurance [facility]) and we had to try [facility] and have not heard back from them to date.  Patient shared that he received  [employer] card in the mail, he is unsure if it is replacing his [facility] card.  Requested patient let the office know if he has a new insurance and bring his card when he comes into the office.  Patient also shared that he has had relief from his constipation with the new medication he is taking (Lactulose).  Patient requested this RNCM call him when he gets his urology appointment as he often forgets when he gets calls from providers.   Knowledge Deficits related to plan of care for management of CHF, CAD, HTN, DMII, and Chronic constipation  Chronic Disease Management support and education  needs related to CHF, CAD, HTN, HLD, DMII, and Chronic constipation  Financial Constraints  Literacy barriers Non-adherence to prescribed medication regimen Cognitive Deficits  RNCM Clinical Goal(s):  Patient will verbalize understanding of plan for management of CHF, CAD, HTN, DMII, and Chronic constipation as evidenced by correct medication management. verbalize basic understanding of  CHF, CAD, HTN, HLD, DMII, and Chronic Constipation disease process and self health management plan as evidenced by improvement in symptoms take all medications exactly as prescribed and will call provider for medication related questions as evidenced by repeating dosage and use of medications. continue to work with RN Care Manager to address care management and care  coordination needs related to  CHF, CAD, HTN, HLD, DMII, and Chronic constipation as evidenced by adherence to CM Team Scheduled appointments demonstrate a decrease in CHF, CAD, HTN, HLD, DMII, and Chronic constipation exacerbations as evidenced by actively participating in program  through collaboration with RN Care manager, provider, and care team.   Interventions: 1:1 collaboration with primary care provider regarding development and update of comprehensive plan of care as evidenced by provider attestation and co-signature Inter-disciplinary care team collaboration (see longitudinal plan of care) Evaluation of current treatment plan related to  self management and patient's adherence to plan as established by provider   CAD Interventions: (Status:  Gaol Not Met.) Short Term Goal Assessed understanding of CAD diagnosis Medications reviewed including medications utilized in CAD treatment plan Provided education on importance of blood pressure control in management of CAD Counseled on importance of regular laboratory monitoring as prescribed Reviewed Importance of taking all medications as prescribed Reviewed Importance of attending all scheduled provider appointments Advised to report any changes in symptoms or exercise tolerance   Heart Failure Interventions:  (Status:  Goal on track:  Yes.) Long Term Goal Basic overview and discussion of pathophysiology of Heart Failure reviewed Provided education on low sodium diet Assessed need for readable accurate scales in home Advised patient to weigh each morning after emptying bladder Discussed importance of daily weight and advised patient to weigh and record daily Discussed the importance of keeping all appointments with provider  Diabetes Interventions:  (Status:  Goal on track:  Yes.) Long Term Goal Assessed patient's understanding of A1c goal: <8% Provided education to patient about basic DM disease process Reviewed medications with  patient and discussed importance of medication adherence Counseled on importance of regular laboratory monitoring as prescribed Discussed plans with patient for ongoing care management follow up and provided patient with direct contact information for care management team Reviewed scheduled/upcoming provider appointments including: follow up with MD on 01/11/21 Lab Results  Component Value Date   HGBA1C 7.6 (A) 12/14/2020   Patient Goals/Self-Care Activities: Take all medications as prescribed Attend all scheduled provider appointments Call pharmacy for medication refills 3-7 days in advance of running out of medications Perform all self care activities independently  Call provider office for new concerns or questions   Follow Up Plan:  Telephone follow up appointment with care management team member scheduled for:  01/10/21    Patient Self Care Activities related to CHF, CAD, HTN, HLD, and DMII:  Patient is unable to independently self-manage chronic health conditions - check blood sugar at prescribed times - check blood sugar if I feel it is too high or too low - enter blood sugar readings and medication or insulin into daily log - take the blood sugar log to all doctor visits - take the blood sugar meter to all doctor visits -  notify provider of any health concerns       Plan: The patient has been provided with contact information for the care management team and has been advised to call with any health related questions or concerns.  [name], RN, BSN, CCM Care Management Coordinator [facility] Phone: [phone]: [phone]

## 2021-01-26 ENCOUNTER — Other Ambulatory Visit: Payer: Self-pay | Admitting: Cardiovascular Disease

## 2021-01-29 ENCOUNTER — Other Ambulatory Visit: Payer: Self-pay | Admitting: *Deleted

## 2021-01-29 MED ORDER — AMLODIPINE BESYLATE 10 MG PO TABS: 10.0000 mg | ORAL_TABLET | Freq: Every day | ORAL | 3 refills | Status: DC

## 2021-01-31 ENCOUNTER — Telehealth: Payer: Self-pay | Admitting: *Deleted

## 2021-01-31 NOTE — Telephone Encounter (Signed)
Call from pt who's very upset. Stated the doctor did not tell him anything about where to go, who to see for the urologist. Explained to the pt the person who does the referral is working on it ; per chart one place do not take his insurance so sent to another place, in which I explained to pt. I also told him I will send her (Chilon) a message to let her know he had called and she will not be back until next week. He said he told his doctor that medicine (Ditropan)was making him go to the bathroom , 6 months ago which made him upset. Stated he did not know what it was for ; informed for an overactive bladder. He's aware to stop taking it per last OV. Stated he was upset when the doctor did not come back in to explain things to him (at last [place]) but sent the nurse.I told him I will talk to our referral person next week and one of [place] will give him a call (this seemed to calm him down).

## 2021-02-09 ENCOUNTER — Other Ambulatory Visit: Payer: Self-pay | Admitting: Internal Medicine

## 2021-02-09 DIAGNOSIS — M109 Gout, unspecified: Secondary | ICD-10-CM

## 2021-02-11 ENCOUNTER — Other Ambulatory Visit: Payer: Self-pay | Admitting: *Deleted

## 2021-02-11 DIAGNOSIS — J3089 Other allergic rhinitis: Secondary | ICD-10-CM

## 2021-02-11 NOTE — Telephone Encounter (Signed)
Next appt scheduled 03/01/21 with PCP. °

## 2021-02-12 MED ORDER — LORATADINE 10 MG PO TABS: ORAL_TABLET | ORAL | 3 refills | Status: DC

## 2021-02-12 NOTE — Addendum Note (Signed)
Addended by: Ebbie Latus on: 02/12/2021 03:23 PM   Modules accepted: Orders

## 2021-02-12 NOTE — Telephone Encounter (Signed)
Refilled as "Print" - please re-send electronically. Thanks

## 2021-02-20 ENCOUNTER — Telehealth: Payer: Medicare (Managed Care)

## 2021-02-20 ENCOUNTER — Telehealth: Payer: Self-pay | Admitting: *Deleted

## 2021-02-20 NOTE — Chronic Care Management (AMB) (Signed)
°  Care Management   Note  02/20/2021 Name: [name] [name] MRN: [id number] DOB: March 17, 1931  [name] [name] is a 88 y.o. year old male who is a primary care patient of [name], MD and is actively engaged with the care management team. I reached out to [facility] by phone today to assist with re-scheduling a follow up visit with the RN Case Manager  Follow up plan: Unsuccessful telephone outreach attempt made. A HIPAA compliant phone message was left for the patient providing contact information and requesting a return call. The care management team will reach out to the patient again over the next 7 days.  If patient returns call to provider office, please advise to call [place] at [phone].  [place] Management  Direct Dial: [phone]

## 2021-02-20 NOTE — Chronic Care Management (AMB) (Signed)
°  Care Management   Note  02/20/2021 Name: [name] [name] MRN: [id number] DOB: Mar 31, 1931  [name] [name] is a 88 y.o. year old male who is a primary care patient of [name], MD and is actively engaged with the care management team. I reached out to [facility] by phone today to assist with re-scheduling a follow up visit with the RN Case Manager  Follow up plan: Telephone appointment with care management team member scheduled for:03/05/21  [place] Management  Direct Dial: [phone]

## 2021-03-01 ENCOUNTER — Ambulatory Visit (INDEPENDENT_AMBULATORY_CARE_PROVIDER_SITE_OTHER): Payer: Medicare PPO | Admitting: Internal Medicine

## 2021-03-01 ENCOUNTER — Other Ambulatory Visit: Payer: Self-pay

## 2021-03-01 ENCOUNTER — Encounter: Payer: Self-pay | Admitting: Internal Medicine

## 2021-03-01 ENCOUNTER — Ambulatory Visit (HOSPITAL_COMMUNITY)
Admission: RE | Admit: 2021-03-01 | Discharge: 2021-03-01 | Disposition: A | Discharge status: Home / Self Care | Payer: Medicare PPO | Source: Ambulatory Visit | Attending: Internal Medicine | Admitting: Internal Medicine

## 2021-03-01 VITALS — BP 140/58 | HR 57 | Temp 97.5°F | Ht 73.0 in | Wt 263.3 lb

## 2021-03-01 DIAGNOSIS — H9011 Conductive hearing loss, unilateral, right ear, with unrestricted hearing on the contralateral side: Secondary | ICD-10-CM

## 2021-03-01 DIAGNOSIS — E1151 Type 2 diabetes mellitus with diabetic peripheral angiopathy without gangrene: Secondary | ICD-10-CM

## 2021-03-01 DIAGNOSIS — N3941 Urge incontinence: Secondary | ICD-10-CM

## 2021-03-01 DIAGNOSIS — I5020 Unspecified systolic (congestive) heart failure: Secondary | ICD-10-CM

## 2021-03-01 DIAGNOSIS — E1142 Type 2 diabetes mellitus with diabetic polyneuropathy: Secondary | ICD-10-CM

## 2021-03-01 DIAGNOSIS — H900 Conductive hearing loss, bilateral: Secondary | ICD-10-CM

## 2021-03-01 DIAGNOSIS — N401 Enlarged prostate with lower urinary tract symptoms: Secondary | ICD-10-CM

## 2021-03-01 DIAGNOSIS — I110 Hypertensive heart disease with heart failure: Secondary | ICD-10-CM

## 2021-03-01 DIAGNOSIS — Z0000 Encounter for general adult medical examination without abnormal findings: Secondary | ICD-10-CM

## 2021-03-01 DIAGNOSIS — I739 Peripheral vascular disease, unspecified: Secondary | ICD-10-CM | POA: Insufficient documentation

## 2021-03-01 DIAGNOSIS — K219 Gastro-esophageal reflux disease without esophagitis: Secondary | ICD-10-CM

## 2021-03-01 DIAGNOSIS — I491 Atrial premature depolarization: Secondary | ICD-10-CM

## 2021-03-01 DIAGNOSIS — Z8546 Personal history of malignant neoplasm of prostate: Secondary | ICD-10-CM

## 2021-03-01 DIAGNOSIS — K5900 Constipation, unspecified: Secondary | ICD-10-CM

## 2021-03-01 DIAGNOSIS — G3184 Mild cognitive impairment, so stated: Secondary | ICD-10-CM

## 2021-03-01 DIAGNOSIS — I10 Essential (primary) hypertension: Secondary | ICD-10-CM

## 2021-03-01 DIAGNOSIS — N35919 Unspecified urethral stricture, male, unspecified site: Secondary | ICD-10-CM

## 2021-03-01 DIAGNOSIS — H9190 Unspecified hearing loss, unspecified ear: Secondary | ICD-10-CM | POA: Insufficient documentation

## 2021-03-01 DIAGNOSIS — K449 Diaphragmatic hernia without obstruction or gangrene: Secondary | ICD-10-CM

## 2021-03-01 DIAGNOSIS — R1013 Epigastric pain: Secondary | ICD-10-CM

## 2021-03-01 MED ORDER — METOPROLOL TARTRATE 25 MG PO TABS: 50.0000 mg | ORAL_TABLET | Freq: Two times a day (BID) | ORAL | 1 refills | Status: DC

## 2021-03-01 NOTE — Progress Notes (Signed)
°  Subjective:     Patient ID: [name] [name], male   DOB: 09/19/1931, 88 y.o.   MRN: [id number]  [name] [name] is an 88 y/o seen in clinic today for follow-up. Today he complains of epigastric pain that is intermittent in nature. He says around 11 years ago he was told he had an ulcer, and got some goat's milk from his neighbor that resolved the ulcer. He reports the pain hurts sometimes, but other times does not hurt at all. He takes a PPI but has not been taking it daily due to cost.  He also reports some difficulty urinating, but was able to schedule an appointment with urology for February 2nd. He says he stopped taking his tamsulosin because it was making him pee too much. His constipation is improved, and he says he was given a red bottle of "real sweet" liquid and takes a spoonful to help him have a bowel movement. He reports not drinking a lot of water - "sometimes I'll have 3 glasses, sometimes not."  We completed a mini-cog and MOCA today and he repeatedly expressed difficulty hearing, saying he knows he has a hearing problem but has just been putting it off.    Review of Systems  Constitutional:  Negative for fever.  HENT:  Positive for hearing loss.   Gastrointestinal:  Positive for abdominal pain. Negative for constipation.       Epigastric abdominal pain that comes and goes.  Genitourinary:  Positive for difficulty urinating and hematuria.  Musculoskeletal:  Positive for back pain.      Objective:   Physical Exam Constitutional:      Appearance: Normal appearance.  HENT:     Head: Normocephalic and atraumatic.     Right Ear: Tympanic membrane and external ear normal. Decreased hearing noted.     Left Ear: Tympanic membrane and external ear normal.     Ears:     Weber exam findings: Does not lateralize.    Right Rinne: AC > BC.    Left Rinne: AC > BC.    Comments: Air conduction in left ear > Air conduction in right ear per patient response Cardiovascular:      Rate and Rhythm: Normal rate. Rhythm irregular.     Heart sounds: Normal heart sounds.  Pulmonary:     Effort: Pulmonary effort is normal. No respiratory distress.     Breath sounds: Normal breath sounds.  Abdominal:     General: Abdomen is flat. Bowel sounds are normal. There is no distension.     Tenderness: There is no abdominal tenderness. There is no right CVA tenderness or left CVA tenderness.  Musculoskeletal:     Right lower leg: No bony tenderness. 1+ Pitting Edema present.     Left lower leg: No bony tenderness. 1+ Pitting Edema present.  Neurological:     Mental Status: He is alert.       Assessment & Plan:     See problem-based charting

## 2021-03-01 NOTE — Assessment & Plan Note (Signed)
He reports that this is less of an issue today, and mentions that he takes "a spoonful of the red bottle I was given that tastes real sweet," which prompts BM quite soon after. His self-reported fluid intake is still low, and encouraged him to drink more water, but this seems improved compared to prior visit. His urinary frequency/difficulties probably also contribute to decreased PO fluid intake.  - Encouraged increased PO fluid intake.

## 2021-03-01 NOTE — Assessment & Plan Note (Addendum)
He reports some difficulty with urination today and mentions that he sometimes sees what looks like blood in the toilet after urinating. His back pain is also still present and he describes this as "achy." He has already arranged urology appointment for evaluation scheduled for 03/07/21.  - Urology appointment 03/07/21 - UA today

## 2021-03-01 NOTE — Assessment & Plan Note (Signed)
Last A1c 11/22 was 7.6 and within goal. He is next due for an A1c in 2/23. Referral sent for diabetic eye exam today.  - Continue mealtime insulin 12 units BID - Continue NPH Novolin 20 units

## 2021-03-01 NOTE — Assessment & Plan Note (Deleted)
Seems to

## 2021-03-01 NOTE — Assessment & Plan Note (Signed)
Irregular rhythm noted on exam and EKG administered in clinic today showing PACs with fusion beats. Given history of combined systolic and diastolic congestive heart failure, as well as 1+ LLE pitting edema on exam, concern that continued PACs may be affecting cardiac output. Will plan to increase metoprolol tartrate to 50mg  BID and contact cardiology to see him in clinic for follow-up. Counseled him that metoprolol increase may have side effects and to contact us if he feels unwell with the medication adjustment.  - Metoprolol tartrate increased to 50mg  BID - Encouraged cardiology follow-up

## 2021-03-01 NOTE — Assessment & Plan Note (Signed)
Blood pressure 155/58 today on initial check, followed by 140/58 on re-check. This is elevated from prior visit. BP regimen includes amlodipine, losartan, and metoprolol tartrate. Metoprolol tartrate was increased from 25mg  to 50mg  BID due to PACs on EKG today, and increased dose will likely contribute to reduced blood pressure. Instructed him to call our office if he feels tired or unwell following increase in metoprolol dose.  Plan: - continue amlodipine 10mg , losartan 50mg  - Increase metoprolol tartrate 25mg  BID to 50mg  BID.

## 2021-03-01 NOTE — Assessment & Plan Note (Signed)
Referral for diabetic eye exam placed today.

## 2021-03-01 NOTE — Assessment & Plan Note (Signed)
Stable and managed with ASA 81mg , atorvastatin 40mg  daily. He reports taking these regularly.

## 2021-03-01 NOTE — Assessment & Plan Note (Addendum)
His oxybutynin was discontinued at last visit in 12/22 and he reports today that he has stopped taking tamsulosin because "it makes him pee so much." Encouraged him to notify the urologist at his appointment in February of his difficulty with urination and that he stopped taking tamsulosin.  - Urology appointment 03/07/21 - UA today

## 2021-03-01 NOTE — Assessment & Plan Note (Addendum)
Last PSA <0.1 in 2020, so will obtain repeat PSA level today. Urinary symptoms have not changed, but scheduled to see urology on 03/07/21  - PSA today

## 2021-03-01 NOTE — Assessment & Plan Note (Signed)
Last Echo 04/19/20 demonstrated EF 45-50 with moderate diastolic cardiomyopathy. On exam 1+ pitting edema in lower extremities and some concern that cardiac output could be exacerbated by PACs as seen on EKG in office today, as well as last visit. Currently on furosemide 20mg  daily, losartan 50mg  daily, and will increase metoprolol tartrate 25mg  twice daily to 50mg  twice daily. Counseled him that if he feels increasingly fatigued or unwell following the change in medication to contact our office. He follows with Richardson Dopp for cardiology. Messaged Richardson Dopp regarding this and encouraged [name] [name] to arrange f/u with him.  - Furosemide 20mg  daily - Losartan 50mg  daily - Metoprolol tartrate 50mg  daily - F/u with cardiology

## 2021-03-01 NOTE — Assessment & Plan Note (Signed)
Today he reports intermittent epigastric pain that varies in severity. The pain is not present during today's visit. He also mentions that he had an ulcer ~11 years ago and his neighbor gave him goat's milk, which completely resolved his symptoms. He does not report any NSAID use. He mentions his pantoprazole was expensive and he stopped taking it on a daily basis. His [employer] Medicare plan should cover the pantoprazole, but I instructed him to contact us if this medication is cost-prohibitive.  - Continue pantoprazole 40mg  daily

## 2021-03-01 NOTE — Assessment & Plan Note (Addendum)
Hearing difficulties noted several times throughout today's interview, as well as during Mini-Cog and MOCA assessments. Patient reported he has been experiencing hearing difficulties "for a while" but has not sought care for this issue. 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 I imagine his hearing loss also contributes to decreased healthcare literacy, difficulty with medications, and decreased scores on MOCA and Mini-Cog, though some cognitive impairment is also likely present. Will plan for ENT assessment for hearing loss and consider re-assessment with MOCA once hearing loss is better managed.  Plan: - ENT Referral for hearing loss

## 2021-03-01 NOTE — Assessment & Plan Note (Signed)
Mild cognitive impairment noted at previous visits. Mini-cog administered today with score 4/5 - able to draw clock with no issues and recall 2/3 objects. Subsequently administered MOCA test with some difficulty, especially in the areas of recall and executive function. Total score 16/25. 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 He said he is aware that he has hearing difficulties and it has been frustrating, though he has not done anything about this to date. He seems especially confused about his medications, and what he takes for which conditions, though he does mention, "if it's not in the box, I don't take it," referring to the lunchbox he keeps all his medications in.  With longstanding PAD and CAD, some vascular dementia likely contributes to his mild cognitive impairment too. It is difficult to assess the extent of this while his hearing loss goes untreated, but a repeat MOCA at a subsequent visit should provide some insight once his hearing loss is addressed.  - Mini-cog today 4/5 - MOCA today 16/25; suspect score is inaccurate due to hearing difficulties, especially in areas of recall. - ENT referral placed today. - Recommend repeat MOCA once hearing loss is addressed

## 2021-03-01 NOTE — Patient Instructions (Addendum)
Thank you, [name] for allowing [place] to provide your care today. Today we discussed:  Constipation:  Increase your water intake as much as you can to prevent constipation.  Bladder Dysfunction: You have an appointment scheduled with the bladder doctor on February 2nd.  Eye Exam: We have made a referral for an eye exam.   Epigastric Pain: Continue to take your pantoprazole on a daily basis. Let us know if it is unaffordable  Heart Medication: We have increased your heart medication. It may make you feel tired and uncomfortable, and if this occurs, please call our office. We also recommend you see your cardiologist as soon as you can.  I have ordered the following labs for you:  Lab Orders         Urinalysis, Reflex Microscopic         PSA      Tests ordered today:  EKG  Referrals ordered today:   Referral Orders         Ambulatory referral to ENT         Ambulatory referral to Ophthalmology      I have ordered the following medication/changed the following medications:   Medication changes: Meds ordered this encounter  Medications   metoprolol tartrate (LOPRESSOR) 25 MG tablet    Sig: Take 2 tablets (50 mg total) by mouth 2 (two) times daily.    Dispense:  180 tablet    Refill:  1     Follow up: 2-3 months   Remember: Should you have any questions or concerns please call the internal medicine clinic at [phone].

## 2021-03-02 LAB — URINALYSIS, ROUTINE W REFLEX MICROSCOPIC
Bilirubin, UA: NEGATIVE
Ketones, UA: NEGATIVE
Leukocytes,UA: NEGATIVE
Nitrite, UA: NEGATIVE
Protein,UA: NEGATIVE
RBC, UA: NEGATIVE
Specific Gravity, UA: 1.01 (ref 1.005–1.030)
Urobilinogen, Ur: 0.2 mg/dL (ref 0.2–1.0)
pH, UA: 5 (ref 5.0–7.5)

## 2021-03-03 LAB — PSA: Prostate Specific Ag, Serum: <0.1 ng/mL (ref 0.0–4.0)

## 2021-03-04 ENCOUNTER — Telehealth: Payer: Self-pay | Admitting: *Deleted

## 2021-03-04 NOTE — Telephone Encounter (Signed)
S/w pt, Per recommendations appt was made for Dr. Acie Fredrickson in February, EKG added to appt notes.

## 2021-03-04 NOTE — Telephone Encounter (Signed)
-----   Message from Liliane Shi, [place] sent at 03/01/2021  1:39 PM EST ----- Regarding: FW: EKG Review Julene Rahn  Can you get him in to see me or Dr. Acie Fredrickson in 4-6 weeks? Thanks Event organiser  ----- Message ----- From: Ames Dura, Medical Student Sent: 03/01/2021  11:59 AM EST To: [name], MD, Liliane Shi, PA-C Subject: EKG Review                                     Hello,  I am a medical student working with Dr. Gilles Chiquito today and wanted to reach out to you about Mr. Vinje, who was seen in clinic this morning. His rhythm was irregular on auscultation today and we performed an EKG that showed continued premature atrial contractions and fusion beats. His metoprolol tartrate was increased from 25mg  BID to 50mg  BID today, but we also wanted to ask if you could possible review his EKG and see him in clinic soon.  Thank you! [place] Student

## 2021-03-05 ENCOUNTER — Ambulatory Visit: Payer: Self-pay

## 2021-03-05 NOTE — Patient Instructions (Signed)
Visit Information  Thank you for taking time to visit with me today. Please don't hesitate to contact me if I can be of assistance to you before our next scheduled telephone appointment.  Our next appointment is by telephone on 04/16/21 at 10:00 am.  Please call the care guide team at [phone] if you need to cancel or reschedule your appointment.   If you are experiencing a Mental Health or [place] or need someone to talk to, please call the [place] National Suicide Prevention Lifeline: 778-045-2261 or TTY: (203)475-8239 TTY (401)856-1662) to talk to a trained counselor   The patient verbalized understanding of instructions, educational materials, and care plan provided today and declined offer to receive copy of patient instructions, educational materials, and care plan.   The patient has been provided with contact information for the care management team and has been advised to call with any health related questions or concerns.   [name], RN, BSN, CCM Care Management Coordinator [facility] Phone: [phone]: [phone]

## 2021-03-05 NOTE — Progress Notes (Signed)
Attestation for Student Documentation:  I personally was present and performed or re-performed the history, physical exam and medical decision-making activities of this service and have verified that the service and findings are accurately documented in the students note.  [name], MD 03/05/2021, 1:58 PM

## 2021-03-05 NOTE — Chronic Care Management (AMB) (Signed)
Care Management    RN Visit Note  03/05/2021 Name: [name] [name] MRN: [id number] DOB: 25-Feb-1931  Subjective: [name] [name] is a 88 y.o. year old male who is a primary care patient of [name] Catalina, MD. The care management team was consulted for assistance with disease management and care coordination needs.    Engaged with patient by telephone for follow up visit in response to provider referral for case management and/or care coordination services.   Consent to Services:   [name] was given information about Care Management services today including:  Care Management services includes personalized support from designated clinical staff supervised by his physician, including individualized plan of care and coordination with other care providers 24/7 contact phone numbers for assistance for urgent and routine care needs. The patient may stop case management services at any time by phone call to the office staff.  Patient agreed to services and consent obtained.   Assessment: Review of patient past medical history, allergies, medications, health status, including review of consultants reports, laboratory and other test data, was performed as part of comprehensive evaluation and provision of chronic care management services.   SDOH (Social Determinants of Health) assessments and interventions performed:    Care Plan  No Known Allergies  Outpatient Encounter Medications as of 03/05/2021  Medication Sig   allopurinol (ZYLOPRIM) 100 MG tablet Take 2 tablets (200 mg total) by mouth daily.   amLODipine (NORVASC) 10 MG tablet Take 1 tablet (10 mg total) by mouth daily.   aspirin EC 81 MG tablet Take 1 tablet (81 mg total) by mouth daily.   atorvastatin (LIPITOR) 40 MG tablet Take 1 tablet (40 mg total) by mouth daily.   Blood Glucose Monitoring Suppl (ONETOUCH VERIO FLEX SYSTEM) w/Device KIT Use OneTouch Verio Flex meter to check blood sugar twice daily.   colchicine 0.6 MG  tablet Take 1 tablet by mouth twice daily   furosemide (LASIX) 20 MG tablet Take 1 tablet by mouth once daily   gabapentin (NEURONTIN) 100 MG capsule Take 1 capsule (100 mg total) by mouth 2 (two) times daily.   insulin NPH Human (NOVOLIN N) 100 UNIT/ML injection Inject 0.2 mLs (20 Units total) into the skin daily before breakfast. Per Dr Remus Blake OV note of 11/17/19   Insulin Regular Human (NOVOLIN R RELION IJ) Inject 10 Units as directed 2 (two) times daily with a meal. Before  breakfast and supper Per Dr Remus Blake OV of 11/17/19   Insulin Syringe-Needle U-100 31G X 15/64" 0.3 ML MISC Use to inject insulin 3 times a day   lactulose (CHRONULAC) 10 GM/15ML solution Take 15 mLs (10 g total) by mouth daily.   loratadine (EQ LORATADINE) 10 MG tablet TAKE 1 TABLET BY MOUTH DAILY AS NEEDED FOR RHINITIS. Strength: 10 mg   losartan (COZAAR) 50 MG tablet Take 1 tablet (50 mg total) by mouth daily.   metFORMIN (GLUCOPHAGE-XR) 500 MG 24 hr tablet TAKE 2 TABLETS BY MOUTH ONCE DAILY WITH SUPPER   metoprolol tartrate (LOPRESSOR) 25 MG tablet Take 2 tablets (50 mg total) by mouth 2 (two) times daily.   Misc Natural Products (PROSTATE HEALTH) CAPS Take 1 capsule by mouth daily at 6 (six) AM.   OneTouch Delica Lancets 30G MISC 1 each by Does not apply route 2 (two) times daily. E 11.9 diagnosis   ONETOUCH VERIO test strip USE AS DIRECTED TO CHECK BLOOD TWICE DAILY   pantoprazole (PROTONIX) 40 MG tablet Take 1 tablet (40 mg total) by mouth  2 (two) times daily. TAKE 30 MINUTES BEFORE MEALS.   senna-docusate (SENOKOT S) 8.6-50 MG tablet Take 2 tablets by mouth 2 (two) times daily.   tamsulosin (FLOMAX) 0.4 MG CAPS capsule Take 1 capsule (0.4 mg total) by mouth daily.   [DISCONTINUED] insulin glargine-yfgn (SEMGLEE) 100 UNIT/ML Pen Inject 20 Units into the skin 2 (two) times daily. Can you message me [name]) with cost after running this. Patient has issues with affordability. Not sure if this is preferred on  plan. (Patient taking differently: Inject 20 Units into the skin 2 (two) times daily.)   No facility-administered encounter medications on file as of 03/05/2021.    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) 12/19/2020   Mild cognitive impairment 06/25/2020   Esophageal dysphagia 04/24/2020   Chronic gout 05/27/2018   Constipation, unspecified 03/26/2018   Type 2 diabetes mellitus with mild 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, bilateral ([place]) 03/24/2018   Type 2 diabetes mellitus with microalbuminuria, with long-term current use of insulin ([place]) 11/13/2017   Healthcare maintenance 11/13/2017   Chronic left shoulder pain 11/13/2017   Severe obesity (BMI 35.0-35.9 with comorbidity) ([place]) 11/13/2017   Urethral stricture in male 11/13/2017   Benign prostatic hyperplasia (BPH) with urinary urge incontinence 11/13/2017   Chronic combined systolic (congestive) and diastolic (congestive) heart failure ([place]) 10/31/2016   Arthritis 12/19/2014   Coronary artery disease involving native coronary artery of native heart without angina pectoris 09/30/2007   Peripheral arterial disease ([place]) 09/30/2007   Chronic non-seasonal allergic rhinitis 02/17/2006   History of prostate cancer 02/17/2006   Type 2 diabetes mellitus with peripheral neuropathy ([place]) 11/04/2005   Hyperlipidemia 11/04/2005   Essential hypertension 11/04/2005   Gastroesophageal reflux disease with hiatal hernia 11/04/2005    Conditions to be addressed/monitored: CHF, HTN, HLD, and DMII  Care Plan : CCM RN- Diabetes Type 2 (Adult)  Updates made by [name], RN since 03/05/2021 12:00 AM     Problem: Glycemic Management (Diabetes, Type 2)   Priority: High     Long-Range Goal: IDDM, HTN, HF, HLD Management Optimized   Start Date: 07/26/2019  Recent Progress: On track   Priority: High  Note:   CARE PLAN ENTRY (see longitudinal plan of care for additional care plan information) Current Barriers: Sussessful outreach to patient this morning.  Patient was recently seen in office for abdominal pain and difficulty with urination.  Patient has not been taking Protonix per our discussion.  He said he was going to go to his pharmacy today to pick up as he has refills through May for 180 tabs, 40 mg.  He has not heard from the office to schedule his hearing exam.  Discussions on the telephone are difficult as he often does not hear what is being said.  Verified upcoming appointments with cardiologist on 03/22/21@11 :91 and Dr. Daryll Drown on 05/03/21@10 :72.  Verified patient is taking his insulin 12u BID and NPH 20u daily.  Patient has not had any issues with hypo/hyperglycemia. Knowledge Deficits related to plan of care for management of CHF, CAD, HTN, DMII, and Chronic constipation  Chronic Disease Management support and education needs related to CHF, CAD, HTN, HLD, DMII, and Chronic constipation  Financial Constraints  Literacy barriers Non-adherence to prescribed medication regimen Cognitive Deficits RNCM Clinical Goal(s):  Patient will verbalize understanding of plan for management of CHF, CAD, HTN, DMII, and  Chronic constipation as evidenced by correct medication management. verbalize basic understanding of  CHF, CAD, HTN, HLD, DMII, and Chronic Constipation disease process and self health management plan as evidenced by improvement in symptoms take all medications exactly as prescribed and will call provider for medication related questions as evidenced by repeating dosage and use of medications. continue to work with RN Care Manager to address care management and care coordination needs related to  CHF, CAD, HTN, HLD, DMII, and Chronic constipation as evidenced by adherence to CM Team Scheduled appointments demonstrate a decrease in CHF, CAD, HTN, HLD, DMII, and Chronic  constipation exacerbations as evidenced by actively participating in program  through collaboration with RN Care manager, provider, and care team.  Interventions: 1:1 collaboration with primary care provider regarding development and update of comprehensive plan of care as evidenced by provider attestation and co-signature Inter-disciplinary care team collaboration (see longitudinal plan of care) Evaluation of current treatment plan related to  self management and patient's adherence to plan as established by provider CAD Interventions: (Status:  Gaol Not Met.) Short Term Goal Assessed understanding of CAD diagnosis Medications reviewed including medications utilized in CAD treatment plan Provided education on importance of blood pressure control in management of CAD Counseled on importance of regular laboratory monitoring as prescribed Reviewed Importance of taking all medications as prescribed Reviewed Importance of attending all scheduled provider appointments Advised to report any changes in symptoms or exercise tolerance Heart Failure Interventions:  (Status:  Goal on track:  Yes.) Long Term Goal Basic overview and discussion of pathophysiology of Heart Failure reviewed Provided education on low sodium diet Assessed need for readable accurate scales in home Advised patient to weigh each morning after emptying bladder Discussed importance of daily weight and advised patient to weigh and record daily Discussed the importance of keeping all appointments with provider Diabetes Interventions:  (Status:  Goal on track:  Yes.) Long Term Goal Assessed patient's understanding of A1c goal: <8% Provided education to patient about basic DM disease process Reviewed medications with patient and discussed importance of medication adherence Counseled on importance of regular laboratory monitoring as prescribed Discussed plans with patient for ongoing care management follow up and provided patient with  direct contact information for care management team Reviewed scheduled/upcoming provider appointments including: Cardiology on 03/22/21@11 :40 and Dr. Daryll Drown at 10:15 on 05/03/21. Lab Results  Component Value Date   HGBA1C 7.6 (A) 12/14/2020   Patient Goals/Self-Care Activities: Take all medications as prescribed Attend all scheduled provider appointments Call pharmacy for medication refills 3-7 days in advance of running out of medications Perform all self care activities independently  Call provider office for new concerns or questions  Follow Up Plan:  Telephone follow up appointment with care management team member scheduled for:  01/10/21    Patient Self Care Activities related to CHF, CAD, HTN, HLD, and DMII:  Patient is unable to independently self-manage chronic health conditions - check blood sugar at prescribed times - check blood sugar if I feel it is too high or too low - enter blood sugar readings and medication or insulin into daily log - take the blood sugar log to all doctor visits - take the blood sugar meter to all doctor visits - notify provider of any health concerns    Plan: The patient has been provided with contact information for the care management team and has been advised to call with any health related questions or concerns.   [name], RN, BSN, CCM Care Management Coordinator  [facility] Phone: [phone]: [phone]

## 2021-03-12 ENCOUNTER — Telehealth: Payer: Self-pay

## 2021-03-12 ENCOUNTER — Other Ambulatory Visit: Payer: Self-pay

## 2021-03-12 MED ORDER — ONETOUCH VERIO VI STRP: ORAL_STRIP | 5 refills | Status: DC

## 2021-03-12 NOTE — Telephone Encounter (Signed)
Return pt's call who sound upset. Stated his BS's are over 400 and the pharmacy told him his insurance will no longer cover one touch; he needs to change pharmacy. Stated he called on Friday but there's no telephone encounter.I told pt I will call the pharmacy (this seemed to calm him down) and I will call him back.  I called [facility] who stated the above is correct. His insurance will cover Accu-chek Guide or True Metrix. Please send rx for meter,test strips , and lancets.  I called pt back to let him know about the above. And I will call him back once the rxs have been sent to the pharmacy.

## 2021-03-12 NOTE — Telephone Encounter (Signed)
Pt is requesting a call back .. he stated that his blood sugar is over 400 . Pt also stated that he has been calling requesting test strips which I did not see so I sent the rx refill to his pcp... pt is very upset

## 2021-03-13 ENCOUNTER — Telehealth: Payer: Self-pay

## 2021-03-13 ENCOUNTER — Other Ambulatory Visit (HOSPITAL_COMMUNITY): Payer: Self-pay

## 2021-03-13 DIAGNOSIS — E1142 Type 2 diabetes mellitus with diabetic polyneuropathy: Secondary | ICD-10-CM

## 2021-03-13 MED ORDER — ACCU-CHEK SOFTCLIX LANCETS MISC: 0 refills | Status: DC

## 2021-03-13 MED ORDER — ACCU-CHEK GUIDE VI STRP: ORAL_STRIP | 0 refills | Status: DC

## 2021-03-13 MED ORDER — ACCU-CHEK GUIDE ME W/DEVICE KIT: PACK | 0 refills | Status: DC

## 2021-03-13 NOTE — Telephone Encounter (Signed)
Per pharmacy, accu-chek is covered. Send 30 day supply in. Called and scheduled patient appt with prior labs. Patient has not been seen since 2021.

## 2021-03-13 NOTE — Telephone Encounter (Signed)
Rxs for accu-chek meter,lancets, test strips were sent to the pharmacy by Dr [name] office per Epic. Pt was called and informed.

## 2021-03-13 NOTE — Addendum Note (Signed)
Addended by: Cinda Quest on: 03/13/2021 12:19 PM   Modules accepted: Orders

## 2021-03-13 NOTE — Telephone Encounter (Signed)
Patient Advocate Encounter   Received notification from [facility] that prior authorization for One Touch test strips is required by his/her insurance [employer].   PA submitted on 03/13/21  Key#: [id number]  Status is pending    [facility] will continue to follow:  Patient Advocate Fax: [phone]

## 2021-03-14 ENCOUNTER — Encounter: Payer: Self-pay | Admitting: Internal Medicine

## 2021-03-14 ENCOUNTER — Other Ambulatory Visit (HOSPITAL_COMMUNITY): Payer: Self-pay

## 2021-03-14 NOTE — Telephone Encounter (Signed)
Patient Advocate Encounter  Prior Authorization for One Touch test strips has been approved.    PA# [id number]  Effective dates: 02/03/21 through 02/02/22  Refill too soon  Spoke with Pharmacy to Process.  Patient Advocate Fax: [phone]

## 2021-03-14 NOTE — Telephone Encounter (Signed)
Thank you Glenda... 

## 2021-03-21 ENCOUNTER — Encounter: Payer: Self-pay | Admitting: Cardiovascular Disease

## 2021-03-21 NOTE — Progress Notes (Signed)
[name] [name] Date of Birth  19-Jan-1932 [facility] / [facility] [address]     [place], [place]  [address] [phone]  Fax  [phone]   Problem List 1. CAD - status post stenting and   status post coronary artery bypass grafting 2. Peripheral vascular disease 3. Diabetes mellitus 4. Hypertension 5. Hyperlipidemia    [name] [name] is a 88 y.o. gentleman with a Hx of CAD - s/p stenting, diabetes mellitus, hypertension and hyperlipidemia. He presents today with the complaint of midsternal chest pain. This typically occurs after he eats some food and then goes out and doesn't work. It resolves after about 30 minutes.  He did not take any nitroglycerin. He took some Prilosec and it eventually resolved.   Jun 29, 2013:  [name] [name] has had CABG since I last saw him.    His CABG was complicated by several things - hematura and now he has urinary incontinence and wears depends.  Nov. 24, 2015:  [name] is s/p CABG. He has CAD, DM and now has urninary incontinence.   Has seen Dr. Jeffie Pollock who does not think there are any options for his incontence.   September 18, 2014:  Doing well.  Has some indigestion .   Has seen Dr. Gwenlyn Found for peripheral vascular disease.  He high-grade segmental right and total left SFA stenosis with single-vessel runoff below the knees bilaterally.   Dr. Gwenlyn Found attempted unsuccessfully to percutaneously revascularize his left SFA.  Feb. 13, 2017:  No CP , No dyspnea  Dr. Gwenlyn Found was not able to revascularized his left SFA.    Has leg pain with walking - right > left ,  Aug. 16, 2017:  [name] [name] is seen today for follow-up visit. He has a history of coronary artery disease and peripheral vascular disease. Has seen Dr. Gwenlyn Found - attempted SFA revascularization but it was unsuccessful.   No cardiac complaints. Has significant claudication with any walking .  Has gotten some relief with gabapentin - causes a dry  mouth  Feb. 12, 2018:  Still eating some salty foods.  Some hot dogs, canned food, sausage  His blood pressure has been a little elevated and he has been having headaches on a regular basis.  Sept. 28, 2018:  [name] [name]  is feeling better. He's cut out a lot of his salty foods. Blood pressure is well-controlled. He thinks that one of his meds is causing a dry mouth may be causing him to have a dry mouth.   May 19, 2017  [name] [name] is doing well .  No CP or dyspnea.    Some pain in right leg with walking - if he goes to far.  Able to do most of his usual activities without too much trouble   November 25, 2017:  [name] [name] is seen today for follow-up visit.  He has a history of coronary artery disease and peripheral vascular disease. He has a history of hypertension and hyperlipidemia. No CP or dyspnea.   Has left shoulder pain when the weather is cold .  Has some claudicatin when he walks.  May 27, 2019: [name] [name] is seen today for follow-up of his coronary artery disease and peripheral vascular disease.  He also has hypertension and hyperlipidemia.  No CP ,  Occasional leg pain  Has indigestion .   Takes pantoprozole  With relief.  Occurs after he eats. , no with exertion   November 28, 2019: [place] is seen today for follow-up of his coronary artery  disease, peripheral vascular disease, hypertension, hyperlipidemia. Wt. Is 276 lbs , sitting around the house too much now with covid.  Has occasional stomach tightness. And gas pains No cp   Feb. 17.2023 [name] [name] is seen today for follow up of his CAD, PVD,HTN, HLD Wt is 264 Has been trying to loose some weight   Chronically short of breath . No much chest pain  Not much exercise,  does some walking  Has some indigestion like pain at times , not exertional   Labs from Aug. 2022 look good   Current Outpatient Medications on File Prior to Visit  Medication Sig Dispense Refill   Accu-Chek Softclix Lancets lancets  Use to check blood sugar twice a day 100 each 0   allopurinol (ZYLOPRIM) 100 MG tablet Take 2 tablets (200 mg total) by mouth daily. 180 tablet 3   amLODipine (NORVASC) 10 MG tablet Take 1 tablet (10 mg total) by mouth daily. 90 tablet 3   aspirin EC 81 MG tablet Take 1 tablet (81 mg total) by mouth daily.     atorvastatin (LIPITOR) 40 MG tablet Take 1 tablet (40 mg total) by mouth daily. 90 tablet 3   Blood Glucose Monitoring Suppl (ACCU-CHEK GUIDE ME) w/Device KIT Use to check blood sugar twice a day 1 kit 0   colchicine 0.6 MG tablet Take 1 tablet by mouth twice daily 60 tablet 0   furosemide (LASIX) 20 MG tablet Take 1 tablet by mouth once daily 90 tablet 3   gabapentin (NEURONTIN) 100 MG capsule Take 1 capsule (100 mg total) by mouth 2 (two) times daily. 180 capsule 3   glucose blood (ACCU-CHEK GUIDE) test strip Use to check blood sugar twice a day 100 each 0   insulin NPH Human (NOVOLIN N) 100 UNIT/ML injection Inject 0.2 mLs (20 Units total) into the skin daily before breakfast. Per Dr [name] OV note of 11/17/19 10 mL 3   Insulin Regular Human (NOVOLIN R RELION IJ) Inject 10 Units as directed 2 (two) times daily with a meal. Before  breakfast and supper Per Dr [name] OV of 11/17/19     Insulin Syringe-Needle U-100 31G X 15/64" 0.3 ML MISC Use to inject insulin 3 times a day 300 each 3   lactulose (CHRONULAC) 10 GM/15ML solution Take 15 mLs (10 g total) by mouth daily. 946 mL 2   loratadine (EQ LORATADINE) 10 MG tablet TAKE 1 TABLET BY MOUTH DAILY AS NEEDED FOR RHINITIS. Strength: 10 mg 90 tablet 3   losartan (COZAAR) 50 MG tablet Take 1 tablet (50 mg total) by mouth daily. 90 tablet 3   metFORMIN (GLUCOPHAGE-XR) 500 MG 24 hr tablet TAKE 2 TABLETS BY MOUTH ONCE DAILY WITH SUPPER 180 tablet 1   metoprolol tartrate (LOPRESSOR) 25 MG tablet Take 2 tablets (50 mg total) by mouth 2 (two) times daily. 180 tablet 1   Misc Natural Products (PROSTATE HEALTH) CAPS Take 1 capsule by mouth daily at 6  (six) AM.     pantoprazole (PROTONIX) 40 MG tablet Take 1 tablet (40 mg total) by mouth 2 (two) times daily. TAKE 30 MINUTES BEFORE MEALS. 180 tablet 3   senna-docusate (SENOKOT S) 8.6-50 MG tablet Take 2 tablets by mouth 2 (two) times daily. 90 tablet 0   tamsulosin (FLOMAX) 0.4 MG CAPS capsule Take 1 capsule (0.4 mg total) by mouth daily. 90 capsule 3   [DISCONTINUED] insulin glargine-yfgn (SEMGLEE) 100 UNIT/ML Pen Inject 20 Units into the skin 2 (two) times daily. Can  you message me Cheral Almas) with cost after running this. Patient has issues with affordability. Not sure if this is preferred on plan. (Patient taking differently: Inject 20 Units into the skin 2 (two) times daily.) 15 mL 1   No current facility-administered medications on file prior to visit.    No Known Allergies  Past Medical History:  Diagnosis Date   Benign prostatic hyperplasia (BPH) with urinary urge incontinence 11/13/2017   Chronic left shoulder pain 11/13/2017   Chronic non-seasonal allergic rhinitis 02/17/2006   Chronic systolic heart failure (HCC) 10/31/2016   Echo (05/03/2015): LVEF 45-50%, grade 2 diastolic dysfunction   Coronary artery disease involving native coronary artery of native heart without angina pectoris 09/30/2007   s/p CABG on 01/05/2013: left internal mammary artery to left anterior descending, saphenous vein graft to obtuse marginal 1, sequential saphenous vein graft to acute marginal and posterior descending   Essential hypertension 11/04/2005   Gastroesophageal reflux disease with hiatal hernia 11/04/2005   History of prostate cancer 02/17/2006   Diagnosed 1995, s/p seed implantation in the late 1990's, PSA undetectable 04/10/2016   Hyperlipidemia 11/04/2005   Obesity (BMI 30.0-34.9) 11/13/2017   Peripheral arterial disease (HCC) 09/30/2007   with claudication, failed attempt at LLE PTCA   Type 2 diabetes mellitus with microalbuminuria, with long-term current use of insulin (HCC) 11/13/2017    Type 2 diabetes mellitus with mild 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, bilateral (HCC) 03/24/2018   Type 2 diabetes mellitus with peripheral neuropathy (HCC) 11/04/2005   Urethral stricture in male 11/13/2017   Noted on cystoscopy 12/18/2014.  Reportedly asked to do chronic intermittent self catheterizations, but has not.    Past Surgical History:  Procedure Laterality Date   CARDIAC CATHETERIZATION  10/05/08   REVEALS HYPOKINESIS OF THE LATERAL WALL AND EF 50-55%   CORONARY ANGIOPLASTY WITH STENT PLACEMENT     CORONARY ARTERY BYPASS GRAFT N/A 01/05/2013   Procedure: CORONARY ARTERY BYPASS GRAFTING (CABG);  Surgeon: Loreli Slot, MD;  Location: [facility] OR;  Service: Open Heart Surgery;  Laterality: N/A;   INTRAOPERATIVE TRANSESOPHAGEAL ECHOCARDIOGRAM N/A 01/05/2013   Procedure: INTRAOPERATIVE TRANSESOPHAGEAL ECHOCARDIOGRAM;  Surgeon: Loreli Slot, MD;  Location: [facility] OR;  Service: Open Heart Surgery;  Laterality: N/A;   LEFT HEART CATHETERIZATION WITH CORONARY ANGIOGRAM N/A 01/03/2013   Procedure: LEFT HEART CATHETERIZATION WITH CORONARY ANGIOGRAM;  Surgeon: [name], MD;  Location: [facility] CATH LAB;  Service: Cardiovascular;  Laterality: N/A;   LOWER EXTREMITY ANGIOGRAM Right 12/07/2012   unsuccessful attempt at percutaneous revascularization of a calcified long segment chronic total occlusion mid left SFA /notes 12/07/2012   LOWER EXTREMITY ANGIOGRAM N/A 12/07/2012   Procedure: LOWER EXTREMITY ANGIOGRAM;  Surgeon: [name], MD;  Location: [facility] CATH LAB;  Service: Cardiovascular;  Laterality: N/A;   PROSTATE SURGERY  1990's    Social History   Tobacco Use  Smoking Status Former   Packs/day: 0.75   Years: 20.00   Pack years: 15.00   Types: Cigarettes   Quit date: 07/08/1980   Years since quitting: 40.7  Smokeless Tobacco Never    Social History   Substance and Sexual Activity  Alcohol Use No   Alcohol/week: 0.0 standard drinks   Comment:  12/07/2012 "quit drinking alcohol > 25 yr ago"    Family History  Problem Relation Age of Onset   Kidney failure Mother    Hypertension Father    Stroke Father    Other Sister    Other Brother  Other Brother    Other Brother    Other Brother    Other Brother    Other Brother    Other Brother    Other Brother    Healthy Daughter    Healthy Daughter    Healthy Daughter    Healthy Son     Reviw of Systems:  Reviewed in the HPI.  All other systems are negative.  Physical Exam: Blood pressure 128/68, pulse 62, height 6' (1.829 m), weight 264 lb (119.7 kg), SpO2 98 %.  GEN:  Well nourished, well developed in no acute distress HEENT: Normal NECK: No JVD; No carotid bruits LYMPHATICS: No lymphadenopathy CARDIAC: RRR , no murmurs, rubs, gallops RESPIRATORY:  Clear to auscultation without rales, wheezing or rhonchi  ABDOMEN: Soft, non-tender, non-distended MUSCULOSKELETAL:  No edema; No deformity  SKIN: Warm and dry NEUROLOGIC:  Alert and oriented x 3   ECG:   Assessment / Plan:   1. CAD -      no angina   . 2. Peripheral vascular disease -     no claudication   3. Diabetes mellitus -    4. Hypertension -    BP is well controlled.    5. Hyperlipidemia  -    lipids from Aug. 2022 look good    6. Chronic systolic congestive heart failure -  Seems to be stable     Mertie Moores, MD  03/22/2021 11:36 AM    [facility] [address],  [place], [place]  [address] Pager [phone] Phone: [phone]; Fax: [phone]

## 2021-03-22 ENCOUNTER — Ambulatory Visit (INDEPENDENT_AMBULATORY_CARE_PROVIDER_SITE_OTHER): Payer: Medicare PPO | Admitting: Cardiovascular Disease

## 2021-03-22 ENCOUNTER — Other Ambulatory Visit: Payer: Self-pay

## 2021-03-22 ENCOUNTER — Encounter: Payer: Self-pay | Admitting: Cardiovascular Disease

## 2021-03-22 VITALS — BP 128/68 | HR 62 | Ht 72.0 in | Wt 264.0 lb

## 2021-03-22 DIAGNOSIS — I2510 Atherosclerotic heart disease of native coronary artery without angina pectoris: Secondary | ICD-10-CM

## 2021-03-22 DIAGNOSIS — I10 Essential (primary) hypertension: Secondary | ICD-10-CM | POA: Diagnosis not present

## 2021-03-22 DIAGNOSIS — I5042 Chronic combined systolic (congestive) and diastolic (congestive) heart failure: Secondary | ICD-10-CM | POA: Diagnosis not present

## 2021-03-22 NOTE — Patient Instructions (Signed)
Medication Instructions:  Your physician recommends that you continue on your current medications as directed. Please refer to the Current Medication list given to you today.  *If you need a refill on your cardiac medications before your next appointment, please call your pharmacy*   Lab Work: NONE If you have labs (blood work) drawn today and your tests are completely normal, you will receive your results only by: [place] (if you have MyChart) OR A paper copy in the mail If you have any lab test that is abnormal or we need to change your treatment, we will call you to review the results.   Testing/Procedures: NONE   Follow-Up: At [facility], you and your health needs are our priority.  As part of our continuing mission to provide you with exceptional heart care, we have created designated Provider Care Teams.  These Care Teams include your primary Cardiologist (physician) and Advanced Practice Providers (APPs -  Physician Assistants and Nurse Practitioners) who all work together to provide you with the care you need, when you need it.  We recommend signing up for the patient portal called "MyChart".  Sign up information is provided on this After Visit Summary.  MyChart is used to connect with patients for Virtual Visits (Telemedicine).  Patients are able to view lab/test results, encounter notes, upcoming appointments, etc.  Non-urgent messages can be sent to your provider as well.   To learn more about what you can do with MyChart, go to [email or web address].    Your next appointment:   1 year(s)  The format for your next appointment:   In Person  Provider:   Mertie Moores, MD, or PA or NP

## 2021-04-16 ENCOUNTER — Ambulatory Visit: Payer: Medicare PPO

## 2021-04-16 NOTE — Chronic Care Management (AMB) (Signed)
? Care Management ?  ? RN Visit Note ? ?04/16/2021 ?Name: [name] [name] MRN: [id number] DOB: 1931/12/07 ? ?Subjective: ?[name] [name] is a 88 y.o. year old male who is a primary care patient of [name], MD. The care management team was consulted for assistance with disease management and care coordination needs.   ? ?Engaged with patient by telephone for follow up visit in response to provider referral for case management and/or care coordination services.  ? ?Consent to Services:  ? [name] [name] was given information about Care Management services today including:  ?Care Management services includes personalized support from designated clinical staff supervised by his physician, including individualized plan of care and coordination with other care providers ?24/7 contact phone numbers for assistance for urgent and routine care needs. ?The patient may stop case management services at any time by phone call to the office staff. ? ?Patient agreed to services and consent obtained.  ? ?Assessment: Review of patient past medical history, allergies, medications, health status, including review of consultants reports, laboratory and other test data, was performed as part of comprehensive evaluation and provision of chronic care management services.  ? ?SDOH (Social Determinants of Health) assessments and interventions performed:   ? ?Care Plan ? ?No Known Allergies ? ?Outpatient Encounter Medications as of 04/16/2021  ?Medication Sig  ? Accu-Chek Softclix Lancets lancets Use to check blood sugar twice a day  ? allopurinol (ZYLOPRIM) 100 MG tablet Take 2 tablets (200 mg total) by mouth daily.  ? amLODipine (NORVASC) 10 MG tablet Take 1 tablet (10 mg total) by mouth daily.  ? aspirin EC 81 MG tablet Take 1 tablet (81 mg total) by mouth daily.  ? atorvastatin (LIPITOR) 40 MG tablet Take 1 tablet (40 mg total) by mouth daily.  ? Blood Glucose Monitoring Suppl (ACCU-CHEK GUIDE ME) w/Device KIT Use to check blood  sugar twice a day  ? colchicine 0.6 MG tablet Take 1 tablet by mouth twice daily  ? furosemide (LASIX) 20 MG tablet Take 1 tablet by mouth once daily  ? gabapentin (NEURONTIN) 100 MG capsule Take 1 capsule (100 mg total) by mouth 2 (two) times daily.  ? glucose blood (ACCU-CHEK GUIDE) test strip Use to check blood sugar twice a day  ? insulin NPH Human (NOVOLIN N) 100 UNIT/ML injection Inject 0.2 mLs (20 Units total) into the skin daily before breakfast. Per Dr [name] OV note of 11/17/19  ? Insulin Regular Human (NOVOLIN R RELION IJ) Inject 10 Units as directed 2 (two) times daily with a meal. Before  breakfast and supper ?Per Dr [name] OV of 11/17/19  ? Insulin Syringe-Needle U-100 31G X 15/64" 0.3 ML MISC Use to inject insulin 3 times a day  ? lactulose (CHRONULAC) 10 GM/15ML solution Take 15 mLs (10 g total) by mouth daily.  ? loratadine (EQ LORATADINE) 10 MG tablet TAKE 1 TABLET BY MOUTH DAILY AS NEEDED FOR RHINITIS. Strength: 10 mg  ? losartan (COZAAR) 50 MG tablet Take 1 tablet (50 mg total) by mouth daily.  ? metFORMIN (GLUCOPHAGE-XR) 500 MG 24 hr tablet TAKE 2 TABLETS BY MOUTH ONCE DAILY WITH SUPPER  ? metoprolol tartrate (LOPRESSOR) 25 MG tablet Take 2 tablets (50 mg total) by mouth 2 (two) times daily.  ? Misc Natural Products (PROSTATE HEALTH) CAPS Take 1 capsule by mouth daily at 6 (six) AM.  ? pantoprazole (PROTONIX) 40 MG tablet Take 1 tablet (40 mg total) by mouth 2 (two) times daily. TAKE 30 MINUTES BEFORE MEALS.  ?  senna-docusate (SENOKOT S) 8.6-50 MG tablet Take 2 tablets by mouth 2 (two) times daily.  ? tamsulosin (FLOMAX) 0.4 MG CAPS capsule Take 1 capsule (0.4 mg total) by mouth daily.  ? [DISCONTINUED] insulin glargine-yfgn (SEMGLEE) 100 UNIT/ML Pen Inject 20 Units into the skin 2 (two) times daily. Can you message me [name]) with cost after running this. Patient has issues with affordability. Not sure if this is preferred on plan. (Patient taking differently: Inject 20 Units into  the skin 2 (two) times daily.)  ? ?No facility-administered encounter medications on file as of 04/16/2021.  ? ? ?Patient Active Problem List  ? Diagnosis Date Noted  ? Hearing loss 03/01/2021  ? Premature atrial contractions 03/01/2021  ? 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 Mild cognitive impairment 06/25/2020  ? Esophageal dysphagia 04/24/2020  ? Chronic gout 05/27/2018  ? Constipation, unspecified 03/26/2018  ? Type 2 diabetes mellitus with mild 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, bilateral ([place]) 03/24/2018  ? Type 2 diabetes mellitus with microalbuminuria, with long-term current use of insulin ([place]) 11/13/2017  ? Healthcare maintenance 11/13/2017  ? Chronic left shoulder pain 11/13/2017  ? Severe obesity (BMI 35.0-35.9 with comorbidity) ([place]) 11/13/2017  ? Urethral stricture in male 11/13/2017  ? Benign prostatic hyperplasia (BPH) with urinary urge incontinence 11/13/2017  ? Chronic combined systolic (congestive) and diastolic (congestive) heart failure ([place]) 10/31/2016  ? Arthritis 12/19/2014  ? Coronary artery disease involving native coronary artery of native heart without angina pectoris 09/30/2007  ? Peripheral arterial disease ([place]) 09/30/2007  ? Chronic non-seasonal allergic rhinitis 02/17/2006  ? History of prostate cancer 02/17/2006  ? Type 2 diabetes mellitus with peripheral neuropathy ([place]) 11/04/2005  ? Hyperlipidemia 11/04/2005  ? Essential hypertension 11/04/2005  ? Gastroesophageal reflux disease with hiatal hernia 11/04/2005  ? ? ?Conditions to be addressed/monitored: CAD, HTN, and DMII ? ?Care Plan : CCM RN- Diabetes Type 2 (Adult)  ?Updates made by [name], RN since 04/16/2021 12:00 AM  ?  ? ?Problem: Glycemic Management (Diabetes, Type 2)   ?Priority: High  ?  ? ?Long-Range Goal: IDDM, HTN, HF, HLD Management Optimized   ?Start Date: 07/26/2019  ?Recent Progress: On track  ?Priority: High  ?Note:   ?CARE PLAN ENTRY ?(see longitudinal plan  of care for additional care plan information) ?Current Barriers: Sussessful outreach to patient this morning.  Patient shared that he recently went to the cardiologist and he was doing well.  Patient shared that his CBG readings mostly run in the 200's in the morning when he takes his readings.  He says he is confused about his medications as he has "too many pills".  Discussed the possibility of him getting "pill pack" for his meds.  He uses [facility] and they do not do pill packs.  Advised of pharmacies which would do the packs and dicussed patient should bring his medications to his appointment with Dr. Daryll Drown on 05/03/21.   ?Knowledge Deficits related to plan of care for management of CHF, CAD, HTN, DMII, and Chronic constipation  ?Chronic Disease Management support and education needs related to CHF, CAD, HTN, HLD, DMII, and Chronic constipation  ?Financial Constraints  ?Literacy barriers ?Non-adherence to prescribed medication regimen ?Cognitive Deficits ?RNCM Clinical Goal(s):  ?Patient will verbalize understanding of plan for management of CHF, CAD, HTN, DMII, and Chronic constipation as evidenced by correct medication management. ?verbalize basic understanding of  CHF, CAD, HTN, HLD, DMII, and Chronic Constipation disease process and self health management  plan as evidenced by improvement in symptoms ?take all medications exactly as prescribed and will call provider for medication related questions as evidenced by repeating dosage and use of medications. ?continue to work with RN Care Manager to address care management and care coordination needs related to  CHF, CAD, HTN, HLD, DMII, and Chronic constipation as evidenced by adherence to CM Team Scheduled appointments ?demonstrate a decrease in CHF, CAD, HTN, HLD, DMII, and Chronic constipation exacerbations as evidenced by actively participating in program  through collaboration with RN Care manager, provider, and care team.  ?Interventions: ?1:1  collaboration with primary care provider regarding development and update of comprehensive plan of care as evidenced by provider attestation and co-signature ?Inter-disciplinary care team collaboration (se

## 2021-04-16 NOTE — Patient Instructions (Signed)
Visit Information ? ?Thank you for taking time to visit with me today. Please don't hesitate to contact me if I can be of assistance to you before our next scheduled telephone appointment. ? ?Our next appointment is by telephone on 06/04/21 at 0930 ? ?Please call the care guide team at [phone] if you need to cancel or reschedule your appointment.  ? ?If you are experiencing a Mental Health or [place] or need someone to talk to, please call the [place] National Suicide Prevention Lifeline: 2124072171 or TTY: (609)059-6152 TTY [phone]) to talk to a trained counselor  ? ?The patient verbalized understanding of instructions, educational materials, and care plan provided today and declined offer to receive copy of patient instructions, educational materials, and care plan.  ? ?The patient has been provided with contact information for the care management team and has been advised to call with any health related questions or concerns.  ? ?[name], RN, BSN, CCM ?Care Management Coordinator ?[facility] ?Phone: [phone]/YVO: [phone]  ?

## 2021-04-23 ENCOUNTER — Other Ambulatory Visit: Payer: Self-pay | Admitting: Endocrinology

## 2021-04-23 ENCOUNTER — Other Ambulatory Visit: Payer: Medicare PPO

## 2021-04-23 DIAGNOSIS — E1142 Type 2 diabetes mellitus with diabetic polyneuropathy: Secondary | ICD-10-CM

## 2021-04-26 ENCOUNTER — Ambulatory Visit: Payer: Medicare PPO | Admitting: Endocrinology

## 2021-05-03 ENCOUNTER — Encounter: Payer: Medicare PPO | Admitting: Internal Medicine

## 2021-05-14 DIAGNOSIS — N3941 Urge incontinence: Secondary | ICD-10-CM | POA: Diagnosis not present

## 2021-05-23 ENCOUNTER — Other Ambulatory Visit: Payer: Self-pay | Admitting: Internal Medicine

## 2021-05-23 DIAGNOSIS — M109 Gout, unspecified: Secondary | ICD-10-CM

## 2021-06-01 ENCOUNTER — Other Ambulatory Visit: Payer: Self-pay | Admitting: Endocrinology

## 2021-06-01 DIAGNOSIS — E1142 Type 2 diabetes mellitus with diabetic polyneuropathy: Secondary | ICD-10-CM

## 2021-06-04 ENCOUNTER — Ambulatory Visit: Payer: Medicare PPO

## 2021-06-04 ENCOUNTER — Other Ambulatory Visit: Payer: Self-pay | Admitting: Endocrinology

## 2021-06-04 DIAGNOSIS — E1142 Type 2 diabetes mellitus with diabetic polyneuropathy: Secondary | ICD-10-CM

## 2021-06-04 DIAGNOSIS — E1143 Type 2 diabetes mellitus with diabetic autonomic (poly)neuropathy: Secondary | ICD-10-CM | POA: Diagnosis not present

## 2021-06-04 DIAGNOSIS — I110 Hypertensive heart disease with heart failure: Secondary | ICD-10-CM | POA: Diagnosis not present

## 2021-06-04 DIAGNOSIS — I509 Heart failure, unspecified: Secondary | ICD-10-CM | POA: Diagnosis not present

## 2021-06-04 DIAGNOSIS — E261 Secondary hyperaldosteronism: Secondary | ICD-10-CM | POA: Diagnosis not present

## 2021-06-04 DIAGNOSIS — E1151 Type 2 diabetes mellitus with diabetic peripheral angiopathy without gangrene: Secondary | ICD-10-CM | POA: Diagnosis not present

## 2021-06-04 DIAGNOSIS — E1159 Type 2 diabetes mellitus with other circulatory complications: Secondary | ICD-10-CM | POA: Diagnosis not present

## 2021-06-04 DIAGNOSIS — E785 Hyperlipidemia, unspecified: Secondary | ICD-10-CM | POA: Diagnosis not present

## 2021-06-04 NOTE — Patient Instructions (Signed)
Visit Information ? ?Thank you for taking time to visit with me today. Please don't hesitate to contact me if I can be of assistance to you before our next scheduled telephone appointment. ? ?Our next appointment is by telephone on 07/16/21 at 10:00. ? ?Please call the care guide team at [phone] if you need to cancel or reschedule your appointment.  ? ?If you are experiencing a Mental Health or [place] or need someone to talk to, please call the [place] National Suicide Prevention Lifeline: (504) 246-7894 or TTY: 267-662-0959 TTY (602)520-8740) to talk to a trained counselor  ? ?The patient verbalized understanding of instructions, educational materials, and care plan provided today and declined offer to receive copy of patient instructions, educational materials, and care plan.  ? ?The patient has been provided with contact information for the care management team and has been advised to call with any health related questions or concerns.  ? ?[name], RN, BSN, CCM ?Care Management Coordinator ?[facility] ?Phone: [phone]/XIP: [phone]  ?

## 2021-06-04 NOTE — Chronic Care Management (AMB) (Signed)
? Care Management ?  ? RN Visit Note ? ?06/04/2021 ?Name: [name] [name] MRN: [id number] DOB: 07-18-1931 ? ?Subjective: ?[name] [name] is a 88 y.o. year old male who is a primary care patient of [name] Falcon, MD. The care management team was consulted for assistance with disease management and care coordination needs.   ? ?Engaged with patient by telephone for follow up visit in response to provider referral for case management and/or care coordination services.  ? ?Consent to Services:  ? [name] [name] was given information about Care Management services today including:  ?Care Management services includes personalized support from designated clinical staff supervised by his physician, including individualized plan of care and coordination with other care providers ?24/7 contact phone numbers for assistance for urgent and routine care needs. ?The patient may stop case management services at any time by phone call to the office staff. ? ?Patient agreed to services and consent obtained.  ? ?Assessment: Review of patient past medical history, allergies, medications, health status, including review of consultants reports, laboratory and other test data, was performed as part of comprehensive evaluation and provision of chronic care management services.  ? ?SDOH (Social Determinants of Health) assessments and interventions performed:   ? ?Care Plan ? ?No Known Allergies ? ?Outpatient Encounter Medications as of 06/04/2021  ?Medication Sig  ? Accu-Chek Softclix Lancets lancets Use to check blood sugar twice a day  ? allopurinol (ZYLOPRIM) 100 MG tablet Take 2 tablets (200 mg total) by mouth daily.  ? amLODipine (NORVASC) 10 MG tablet Take 1 tablet (10 mg total) by mouth daily.  ? aspirin EC 81 MG tablet Take 1 tablet (81 mg total) by mouth daily.  ? atorvastatin (LIPITOR) 40 MG tablet Take 1 tablet (40 mg total) by mouth daily.  ? Blood Glucose Monitoring Suppl (ACCU-CHEK GUIDE ME) w/Device KIT Use to check blood  sugar twice a day  ? colchicine 0.6 MG tablet Take 1 tablet by mouth twice daily  ? furosemide (LASIX) 20 MG tablet Take 1 tablet by mouth once daily  ? gabapentin (NEURONTIN) 100 MG capsule Take 1 capsule (100 mg total) by mouth 2 (two) times daily.  ? glucose blood (ACCU-CHEK GUIDE) test strip Use to check blood sugar twice a day  ? insulin NPH Human (NOVOLIN N) 100 UNIT/ML injection Inject 0.2 mLs (20 Units total) into the skin daily before breakfast. Per Dr [name] [name] OV note of 11/17/19  ? Insulin Regular Human (NOVOLIN R RELION IJ) Inject 10 Units as directed 2 (two) times daily with a meal. Before  breakfast and supper ?Per Dr [name] [name] OV of 11/17/19  ? Insulin Syringe-Needle U-100 31G X 15/64" 0.3 ML MISC Use to inject insulin 3 times a day  ? lactulose (CHRONULAC) 10 GM/15ML solution Take 15 mLs (10 g total) by mouth daily.  ? loratadine (EQ LORATADINE) 10 MG tablet TAKE 1 TABLET BY MOUTH DAILY AS NEEDED FOR RHINITIS. Strength: 10 mg  ? losartan (COZAAR) 50 MG tablet Take 1 tablet (50 mg total) by mouth daily.  ? metFORMIN (GLUCOPHAGE-XR) 500 MG 24 hr tablet TAKE 2 TABLETS BY MOUTH ONCE DAILY WITH SUPPER  ? metoprolol tartrate (LOPRESSOR) 25 MG tablet Take 2 tablets (50 mg total) by mouth 2 (two) times daily.  ? Misc Natural Products (PROSTATE HEALTH) CAPS Take 1 capsule by mouth daily at 6 (six) AM.  ? pantoprazole (PROTONIX) 40 MG tablet Take 1 tablet (40 mg total) by mouth 2 (two) times daily. TAKE 30 MINUTES BEFORE MEALS.  ?  senna-docusate (SENOKOT S) 8.6-50 MG tablet Take 2 tablets by mouth 2 (two) times daily.  ? tamsulosin (FLOMAX) 0.4 MG CAPS capsule Take 1 capsule (0.4 mg total) by mouth daily.  ? [DISCONTINUED] insulin glargine-yfgn (SEMGLEE) 100 UNIT/ML Pen Inject 20 Units into the skin 2 (two) times daily. Can you message me [name] [name]) with cost after running this. Patient has issues with affordability. Not sure if this is preferred on plan. (Patient taking differently: Inject 20 Units into  the skin 2 (two) times daily.)  ? ?No facility-administered encounter medications on file as of 06/04/2021.  ? ? ?Patient Active Problem List  ? Diagnosis Date Noted  ? Hearing loss 03/01/2021  ? Premature atrial contractions 03/01/2021  ? 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 Mild cognitive impairment 06/25/2020  ? Esophageal dysphagia 04/24/2020  ? Chronic gout 05/27/2018  ? Constipation, unspecified 03/26/2018  ? Type 2 diabetes mellitus with mild 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, bilateral ([place]) 03/24/2018  ? Type 2 diabetes mellitus with microalbuminuria, with long-term current use of insulin ([place]) 11/13/2017  ? Healthcare maintenance 11/13/2017  ? Chronic left shoulder pain 11/13/2017  ? Severe obesity (BMI 35.0-35.9 with comorbidity) ([place]) 11/13/2017  ? Urethral stricture in male 11/13/2017  ? Benign prostatic hyperplasia (BPH) with urinary urge incontinence 11/13/2017  ? Chronic combined systolic (congestive) and diastolic (congestive) heart failure ([place]) 10/31/2016  ? Arthritis 12/19/2014  ? Coronary artery disease involving native coronary artery of native heart without angina pectoris 09/30/2007  ? Peripheral arterial disease ([place]) 09/30/2007  ? Chronic non-seasonal allergic rhinitis 02/17/2006  ? History of prostate cancer 02/17/2006  ? Type 2 diabetes mellitus with peripheral neuropathy ([place]) 11/04/2005  ? Hyperlipidemia 11/04/2005  ? Essential hypertension 11/04/2005  ? Gastroesophageal reflux disease with hiatal hernia 11/04/2005  ? ? ?Conditions to be addressed/monitored: CHF, CAD, HTN, and DMII ? ?Care Plan : CCM RN- Diabetes Type 2 (Adult)  ?Updates made by [name] Killian, RN since 06/04/2021 12:00 AM  ?  ? ?Problem: Glycemic Management (Diabetes, Type 2)   ?Priority: High  ?  ? ?Long-Range Goal: IDDM, HTN, HF, HLD Management Optimized   ?Start Date: 07/26/2019  ?Recent Progress: On track  ?Priority: High  ?Note:   ?CARE PLAN ENTRY ?(see longitudinal  plan of care for additional care plan information) ?Current Barriers: Sussessful outreach to patient this morning.  Patient has difficulty hearing on phone but he did share that he is feeling well, his CBG readings run from 180-200, he takes it 2x day.  Patient aware of his upcoming appointment with Dr. Daryll Drown on 06/21/21$RemoveBeforeD'@0945'[id number]$  and he stated he would bring his glucometer and his medications to his appointment.   ?Knowledge Deficits related to plan of care for management of CHF, CAD, HTN, DMII, and Chronic constipation  ?Chronic Disease Management support and education needs related to CHF, CAD, HTN, HLD, DMII, and Chronic constipation  ?Financial Constraints  ?Literacy barriers ?Non-adherence to prescribed medication regimen ?Cognitive Deficits ?RNCM Clinical Goal(s):  ?Patient will verbalize understanding of plan for management of CHF, CAD, HTN, DMII, and Chronic constipation as evidenced by correct medication management. ?verbalize basic understanding of  CHF, CAD, HTN, HLD, DMII, and Chronic Constipation disease process and self health management plan as evidenced by improvement in symptoms ?take all medications exactly as prescribed and will call provider for medication related questions as evidenced by repeating dosage and use of medications. ?continue to work with RN Care Manager to address care management and care coordination  needs related to  CHF, CAD, HTN, HLD, DMII, and Chronic constipation as evidenced by adherence to CM Team Scheduled appointments ?demonstrate a decrease in CHF, CAD, HTN, HLD, DMII, and Chronic constipation exacerbations as evidenced by actively participating in program  through collaboration with RN Care manager, provider, and care team.  ?Interventions: ?1:1 collaboration with primary care provider regarding development and update of comprehensive plan of care as evidenced by provider attestation and co-signature ?Inter-disciplinary care team collaboration (see longitudinal plan of  care) ?Evaluation of current treatment plan related to  self management and patient's adherence to plan as established by provider ?CAD Interventions: (Status:  Gaol Not Met.) Short Term Goal ?Assessed underst

## 2021-06-21 ENCOUNTER — Ambulatory Visit (INDEPENDENT_AMBULATORY_CARE_PROVIDER_SITE_OTHER): Payer: Medicare PPO | Admitting: Internal Medicine

## 2021-06-21 ENCOUNTER — Other Ambulatory Visit: Payer: Self-pay

## 2021-06-21 ENCOUNTER — Encounter: Payer: Self-pay | Admitting: Internal Medicine

## 2021-06-21 VITALS — BP 112/71 | HR 64 | Temp 97.6°F | Ht 73.0 in | Wt 262.7 lb

## 2021-06-21 DIAGNOSIS — I739 Peripheral vascular disease, unspecified: Secondary | ICD-10-CM

## 2021-06-21 DIAGNOSIS — E1151 Type 2 diabetes mellitus with diabetic peripheral angiopathy without gangrene: Secondary | ICD-10-CM

## 2021-06-21 DIAGNOSIS — E1129 Type 2 diabetes mellitus with other diabetic kidney complication: Secondary | ICD-10-CM

## 2021-06-21 DIAGNOSIS — G8929 Other chronic pain: Secondary | ICD-10-CM

## 2021-06-21 DIAGNOSIS — R809 Proteinuria, unspecified: Secondary | ICD-10-CM

## 2021-06-21 DIAGNOSIS — Z87891 Personal history of nicotine dependence: Secondary | ICD-10-CM

## 2021-06-21 DIAGNOSIS — H9011 Conductive hearing loss, unilateral, right ear, with unrestricted hearing on the contralateral side: Secondary | ICD-10-CM | POA: Diagnosis not present

## 2021-06-21 DIAGNOSIS — I10 Essential (primary) hypertension: Secondary | ICD-10-CM

## 2021-06-21 DIAGNOSIS — N35919 Unspecified urethral stricture, male, unspecified site: Secondary | ICD-10-CM

## 2021-06-21 DIAGNOSIS — I110 Hypertensive heart disease with heart failure: Secondary | ICD-10-CM

## 2021-06-21 DIAGNOSIS — I5042 Chronic combined systolic (congestive) and diastolic (congestive) heart failure: Secondary | ICD-10-CM

## 2021-06-21 DIAGNOSIS — J3089 Other allergic rhinitis: Secondary | ICD-10-CM | POA: Diagnosis not present

## 2021-06-21 DIAGNOSIS — Z794 Long term (current) use of insulin: Secondary | ICD-10-CM

## 2021-06-21 DIAGNOSIS — M25512 Pain in left shoulder: Secondary | ICD-10-CM | POA: Diagnosis not present

## 2021-06-21 DIAGNOSIS — E113293 Type 2 diabetes mellitus with mild 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, bilateral: Secondary | ICD-10-CM | POA: Diagnosis not present

## 2021-06-21 DIAGNOSIS — E1142 Type 2 diabetes mellitus with diabetic polyneuropathy: Secondary | ICD-10-CM

## 2021-06-21 DIAGNOSIS — E7800 Pure hypercholesterolemia, unspecified: Secondary | ICD-10-CM

## 2021-06-21 LAB — POCT GLYCOSYLATED HEMOGLOBIN (HGB A1C): Hemoglobin A1C: 8 % — AB (ref 4.0–5.6)

## 2021-06-21 LAB — GLUCOSE, CAPILLARY: Glucose-Capillary: 168 mg/dL — ABNORMAL HIGH (ref 70–99)

## 2021-06-21 MED ORDER — INSULIN REGULAR HUMAN 100 UNIT/ML IJ SOLN: 12.0000 [IU] | Freq: Two times a day (BID) | INTRAMUSCULAR | 11 refills | Status: DC

## 2021-06-21 NOTE — Assessment & Plan Note (Signed)
He takes loratadine as needed.  Currently well controlled.  We will continue.

## 2021-06-21 NOTE — Assessment & Plan Note (Signed)
LDL is well controlled at 55.  He is on atorvastatin which will be continued.

## 2021-06-21 NOTE — Assessment & Plan Note (Signed)
He notes chronic issues with pain with walking.  He recently saw his cardiologist in February and no changes were made.  He has well controlled blood pressure.  His diabetes is likely as well controlled as possible right now given age.  We will continue to work on this.  He notes ability to walk 4 blocks before he gets leg pain.   Plan Continue good blood pressure control Continue aspirin and statin

## 2021-06-21 NOTE — Assessment & Plan Note (Signed)
[name] [name] is noted to have an A1C of 8.0 today, up from 7.6.  He is 75, so I think a reasonable goal would be < 8 for him.  He is very concerned about pill burden and having trouble with his different medications.  I have advised him to stop his metformin today and we will increase his insulin.  He is amenable to this plan.  He has CKD stage 3, will check BMET today to see how this is fairing.  He has an LDL of 55 at last check, he is on atorvastatin.  He is on gabapentin for chronic neuropathy. He is due for an eye exam, which was requested that he schedule.   Plan Stop metformin Increase Novolin to 12 units BIDWC, continue NPH 20 units in the morning

## 2021-06-21 NOTE — Assessment & Plan Note (Signed)
He has not yet scheduled with ENT.  We will plan to follow up on this referral to see if he can be scheduled.

## 2021-06-21 NOTE — Patient Instructions (Signed)
[name] -   Please STOP your metformin  Please INCREASE your short acting insulin (novolin, clear) to 12 units before meals.  Please keep taking the NPH (cloudy) 20 units in the morning.   Please follow up with [facility], [facility] Throat to get your hearing checked.  Dr. Deeann Saint office should be calling you.   Thank you!  Come back in 3 months.

## 2021-06-21 NOTE — Assessment & Plan Note (Signed)
[name] is noted to have an A1C of 8.0 today, up from 7.6.  He is 31, so I think a reasonable goal would be < 8 for him.  He is very concerned about pill burden and having trouble with his different medications.  I have advised him to stop his metformin today and we will increase his insulin.  He is amenable to this plan.  He has CKD stage 3, will check BMET today to see how this is fairing.  He has an LDL of 55 at last check, he is on atorvastatin.  He is on gabapentin for chronic neuropathy. He is due for an eye exam, which was requested that he schedule.   Plan Stop metformin Increase Novolin to 12 units BIDWC, continue NPH 20 units in the morning

## 2021-06-21 NOTE — Assessment & Plan Note (Signed)
Followed up with Urology in February --> cystoscopy with dilation.  He was also started on Trospium '20mg'$  BID  Plan Follow up with Urology as planned.

## 2021-06-21 NOTE — Assessment & Plan Note (Signed)
Today he reports right shoulder pain.  He has no ROM issues.  He may benefit from injectable therapy.  He would like to try topical therapy first.   Plan Topical voltaren or lidocaine.

## 2021-06-21 NOTE — Progress Notes (Signed)
   Subjective:    Patient ID: [name] [name], male    DOB: 12-05-1931, 88 y.o.   MRN: [id number]  3 month follow up for DM2 and constipation  HPI  [name] [name] is an 88 year old man with PMH of DM2, HLD, HTN, PVD/CAD, chronic shoulder pain, gout, urethral stricture who presents for follow up.   [name] [name] is frustrated with the amount of pills he is on.  He is also frustrated that he did not receive pain medication after his cystoscopy in February.  He is due to follow up with Urology next week and will discuss his issues further.    He continues to have chronic shoulder pain which comes and goes.  He has mild pain today and preserved ROM.  He notes not trying anything as of yet for this and I advised her try topical medication at this time.  He will get them OTC.   He has not followed up with ENT.  It appears in epic that he has not been scheduled.  Our scheduler will follow up with this.   Otherwise, he has no complaints, tries to stay busy.  He notes leg pain with walking more than 4 blocks, but otherwise is active.   Review of Systems  Constitutional:  Negative for activity change, appetite change, diaphoresis and fatigue.  Respiratory:  Negative for cough and shortness of breath.   Cardiovascular:  Negative for chest pain and leg swelling.  Gastrointestinal:  Positive for constipation (goes every 3rd day at most). Negative for anal bleeding and nausea.  Genitourinary:  Positive for frequency and penile pain (notes due to wetness from dribbling and not being able to change clothes). Negative for difficulty urinating, dysuria, enuresis, flank pain, hematuria and penile discharge.  Musculoskeletal:  Positive for arthralgias.  Neurological:  Negative for weakness.  Psychiatric/Behavioral:  Negative for decreased concentration and dysphoric mood.       Objective:   Physical Exam Vitals and nursing note reviewed.  Constitutional:      General: He is not in acute distress.     Appearance: Normal appearance. He is obese. He is not toxic-appearing.  HENT:     Head: Normocephalic and atraumatic.  Eyes:     General: No scleral icterus.       Right eye: No discharge.        Left eye: No discharge.     Conjunctiva/sclera: Conjunctivae normal.  Cardiovascular:     Rate and Rhythm: Normal rate and regular rhythm.     Heart sounds: No murmur heard. Pulmonary:     Effort: Pulmonary effort is normal. No respiratory distress.  Musculoskeletal:        General: Tenderness (very mild at right shoulder AC joint and laterally) present. No swelling. Normal range of motion.  Skin:    General: Skin is warm and dry.  Neurological:     General: No focal deficit present.     Mental Status: He is alert.     Motor: No weakness.     Coordination: Coordination normal.  Psychiatric:        Mood and Affect: Mood normal.        Behavior: Behavior normal.    CMET today to follow up renal function      Assessment & Plan:  RTC in 3 months

## 2021-06-21 NOTE — Assessment & Plan Note (Signed)
This issue is well controlled.  He has no SOB, no LE swelling.  He takes lasix along with amlodipine, losartan, metoprolol.  He is following with cardiology.   Plan Continue current therapy which has him well controlled.

## 2021-06-21 NOTE — Assessment & Plan Note (Signed)
BP today is well controlled at 112/71.  He is on metoprolol, losartan and amlodipine at this time with good results.  He has no chest pain.   Plan:  Continue triple therapy

## 2021-06-22 LAB — CMP14 + ANION GAP
ALT: 13 IU/L (ref 0–44)
AST: 16 IU/L (ref 0–40)
Albumin/Globulin Ratio: 1.5 (ref 1.2–2.2)
Albumin: 3.9 g/dL (ref 3.6–4.6)
Alkaline Phosphatase: 106 IU/L (ref 44–121)
Anion Gap: 15 mmol/L (ref 10.0–18.0)
BUN/Creatinine Ratio: 15 (ref 10–24)
BUN: 20 mg/dL (ref 8–27)
Bilirubin Total: 0.5 mg/dL (ref 0.0–1.2)
CO2: 21 mmol/L (ref 20–29)
Calcium: 8.7 mg/dL (ref 8.6–10.2)
Chloride: 105 mmol/L (ref 96–106)
Creatinine, Ser: 1.36 mg/dL — ABNORMAL HIGH (ref 0.76–1.27)
Globulin, Total: 2.6 g/dL (ref 1.5–4.5)
Glucose: 155 mg/dL — ABNORMAL HIGH (ref 70–99)
Potassium: 4 mmol/L (ref 3.5–5.2)
Sodium: 141 mmol/L (ref 134–144)
Total Protein: 6.5 g/dL (ref 6.0–8.5)
eGFR: 50 mL/min/{1.73_m2} — ABNORMAL LOW (ref >59)

## 2021-06-24 ENCOUNTER — Encounter (HOSPITAL_COMMUNITY): Payer: Self-pay | Admitting: Emergency Medicine

## 2021-06-24 ENCOUNTER — Ambulatory Visit (HOSPITAL_COMMUNITY)
Admission: EM | Admit: 2021-06-24 | Discharge: 2021-06-24 | Disposition: A | Discharge status: Home / Self Care | Payer: Medicare PPO | Attending: Family Medicine | Admitting: Family Medicine

## 2021-06-24 DIAGNOSIS — M25511 Pain in right shoulder: Secondary | ICD-10-CM | POA: Diagnosis not present

## 2021-06-24 MED ORDER — MELOXICAM 15 MG PO TABS: 15.0000 mg | ORAL_TABLET | Freq: Every day | ORAL | 0 refills | Status: DC

## 2021-06-24 NOTE — Discharge Instructions (Signed)
In addition to the medication I have prescribed, you may take over the counter Tylenol as directed on the bottle.

## 2021-06-24 NOTE — ED Triage Notes (Signed)
Pt is present today with right shoulder pain. Pt states that the pain started thursday. Pt states that the pain radiates down his arm sometimes.

## 2021-06-26 ENCOUNTER — Other Ambulatory Visit: Payer: Self-pay | Admitting: Internal Medicine

## 2021-06-26 DIAGNOSIS — N99111 Postprocedural bulbous urethral stricture: Secondary | ICD-10-CM | POA: Diagnosis not present

## 2021-06-26 DIAGNOSIS — R8290 Unspecified abnormal findings in urine: Secondary | ICD-10-CM | POA: Diagnosis not present

## 2021-06-26 DIAGNOSIS — K219 Gastro-esophageal reflux disease without esophagitis: Secondary | ICD-10-CM

## 2021-06-26 NOTE — ED Provider Notes (Signed)
[place]   [id number] 06/24/21 Arrival Time: 1229  ASSESSMENT & PLAN:  1. Acute pain of right shoulder    No indications for R shoulder imaging. Suspect strain vs bursitis vs impingement. Discussed importance of ROM.  Discharge Medication List as of 06/24/2021  1:37 PM     START taking these medications   Details  meloxicam (MOBIC) 15 MG tablet Take 1 tablet (15 mg total) by mouth daily., Starting Mon 06/24/2021, Normal       Recommend:  Follow-up Information     [name], MD.   Specialty: Internal Medicine Why: If worsening or failing to improve as anticipated. Contact information: [place] [address] [phone]                Reviewed expectations re: course of current medical issues. Questions answered. Outlined signs and symptoms indicating need for more acute intervention. Patient verbalized understanding. After Visit Summary given.  SUBJECTIVE: History from: patient. [name] [name] is a 88 y.o. male who reports fairly persistent dull aching pain of R shoulder; poorly localized. No trauma/injury. No extremity sensation changes or weakness. Pain worse with abducting shoulder. Does not wake him at night. No tx PTA.  Past Surgical History:  Procedure Laterality Date   CARDIAC CATHETERIZATION  10/05/08   REVEALS HYPOKINESIS OF THE LATERAL WALL AND EF 50-55%   CORONARY ANGIOPLASTY WITH STENT PLACEMENT     CORONARY ARTERY BYPASS GRAFT N/A 01/05/2013   Procedure: CORONARY ARTERY BYPASS GRAFTING (CABG);  Surgeon: Melrose Nakayama, MD;  Location: [facility];  Service: Open Heart Surgery;  Laterality: N/A;   INTRAOPERATIVE TRANSESOPHAGEAL ECHOCARDIOGRAM N/A 01/05/2013   Procedure: INTRAOPERATIVE TRANSESOPHAGEAL ECHOCARDIOGRAM;  Surgeon: Melrose Nakayama, MD;  Location: [facility];  Service: Open Heart Surgery;  Laterality: N/A;   LEFT HEART CATHETERIZATION WITH CORONARY ANGIOGRAM N/A 01/03/2013   Procedure: LEFT HEART  CATHETERIZATION WITH CORONARY ANGIOGRAM;  Surgeon: Lorretta Harp, MD;  Location[facility] C[facility] LAB;  Service: Cardiovascular;  Laterality: N/A;   LOWER EXTREMITY ANGIOGRAM Right 12/07/2012   unsuccessful attempt at percutaneous revascularization of a calcified long segment chronic total occlusion mid left SFA /notes 12/07/2012   LOWER EXTREMITY ANGIOGRAM N/A 12/07/2012   Procedure: LOWER EXTREMITY ANGIOGRAM;  Surgeon: Lorretta Harp, MD;  Location: [facility] CATH LAB;  Service: Cardiovascular;  Laterality: N/A;   PROSTATE SURGERY  1990's      OBJECTIVE:  Vitals:   06/24/21 1318  BP: (!) 154/66  Pulse: 63  Resp: 18  Temp: 98 F (36.7 C)  SpO2: 95%    General appearance: alert; no distress HEENT: [place]; AT Neck: supple with FROM Resp: unlabored respirations Extremities: RUE: warm with well perfused appearance; poorly localized moderate tenderness over right superior/anterior shoulder; without gross deformities; swelling: none; bruising: none; shoulder ROM: normal, with discomfort CV: brisk extremity capillary refill of RUE; 2+ radial pulse of RUE. Skin: warm and dry; no visible rashes Neurologic: gait normal; normal sensation and strength of RUE Psychological: alert and cooperative; normal mood and affect   No Known Allergies  Past Medical History:  Diagnosis Date   Benign prostatic hyperplasia (BPH) with urinary urge incontinence 11/13/2017   Chronic left shoulder pain 11/13/2017   Chronic non-seasonal allergic rhinitis 7/74/1423   Chronic systolic heart failure ([place]) 10/31/2016   Echo (05/03/2015): LVEF 95-32%, grade 2 diastolic dysfunction   Coronary artery disease involving native coronary artery of native heart without angina pectoris 09/30/2007   s/p CABG on 01/05/2013: left internal  mammary artery to left anterior descending, saphenous vein graft to obtuse marginal 1, sequential saphenous vein graft to acute marginal and posterior descending   Essential hypertension 11/04/2005    Gastroesophageal reflux disease with hiatal hernia 11/04/2005   History of prostate cancer 02/17/2006   Diagnosed 1995, s/p seed implantation in the late 1990's, PSA undetectable 04/10/2016   Hyperlipidemia 11/04/2005   Obesity (BMI 30.0-34.9) 11/13/2017   Peripheral arterial disease ([place]) 09/30/2007   with claudication, failed attempt at LLE PTCA   Type 2 diabetes mellitus with microalbuminuria, with long-term current use of insulin ([place]) 11/13/2017   Type 2 diabetes mellitus with mild 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, bilateral ([place]) 03/24/2018   Type 2 diabetes mellitus with peripheral neuropathy ([place]) 11/04/2005   Urethral stricture in male 11/13/2017   Noted on cystoscopy 12/18/2014.  Reportedly asked to do chronic intermittent self catheterizations, but has not.   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  Tobacco Use   Smoking status: Former    Packs/day: 0.75    Years: 20.00    Pack years: 15.00    Types: Cigarettes    Quit date: 07/08/1980    Years since quitting: 40.9   Smokeless tobacco: Never  Vaping Use   Vaping Use: Never used  Substance and Sexual Activity   Alcohol use: No    Alcohol/week: 0.0 standard drinks    Comment: 12/07/2012 "quit drinking alcohol > 25 yr ago"   Drug use: No   Sexual activity: Not Currently  Other Topics Concern   Not on file  Social History Narrative   Occupation: Company secretary   Married   Social Determinants of Radio broadcast assistant Strain: Not on file  Food Insecurity: Not on file  Transportation Needs: Not on file  Physical Activity: Not on file  Stress: Not on file  Social Connections: Not on file   Family History  Problem Relation Age of Onset   Kidney failure Mother     Hypertension Father    Stroke Father    Other Sister    Other Brother    Other Brother    Other Brother    Other Brother    Other Brother    Other Brother    Other Brother    Other Brother    Healthy Daughter    Healthy Daughter    Healthy Daughter    Healthy Son    Past Surgical History:  Procedure Laterality Date   CARDIAC CATHETERIZATION  10/05/08   REVEALS HYPOKINESIS OF THE LATERAL WALL AND EF 50-55%   CORONARY ANGIOPLASTY WITH STENT PLACEMENT     CORONARY ARTERY BYPASS GRAFT N/A 01/05/2013   Procedure: CORONARY ARTERY BYPASS GRAFTING (CABG);  Surgeon: Melrose Nakayama, MD;  Location: [facility];  Service: Open Heart Surgery;  Laterality: N/A;   INTRAOPERATIVE TRANSESOPHAGEAL ECHOCARDIOGRAM N/A 01/05/2013   Procedure: INTRAOPERATIVE TRANSESOPHAGEAL ECHOCARDIOGRAM;  Surgeon: Melrose Nakayama, MD;  Location: [facility];  Service: Open Heart Surgery;  Laterality: N/A;   LEFT HEART CATHETERIZATION WITH CORONARY ANGIOGRAM N/A 01/03/2013   Procedure: LEFT HEART CATHETERIZATION WITH CORONARY ANGIOGRAM;  Surgeon: Lorretta Harp, MD;  Location: [facility] CATH LAB;  Service: Cardiovascular;  Laterality: N/A;  LOWER EXTREMITY ANGIOGRAM Right 12/07/2012   unsuccessful attempt at percutaneous revascularization of a calcified long segment chronic total occlusion mid left SFA /notes 12/07/2012   LOWER EXTREMITY ANGIOGRAM N/A 12/07/2012   Procedure: LOWER EXTREMITY ANGIOGRAM;  Surgeon: Lorretta Harp, MD;  Location: [facility] CATH LAB;  Service: Cardiovascular;  Laterality: N/A;   PROSTATE SURGERY  1990's       Vanessa Kick, MD 06/26/21 1048

## 2021-06-27 ENCOUNTER — Ambulatory Visit (INDEPENDENT_AMBULATORY_CARE_PROVIDER_SITE_OTHER): Payer: Medicare PPO | Admitting: Internal Medicine

## 2021-06-27 VITALS — BP 137/62 | HR 83 | Wt 264.5 lb

## 2021-06-27 DIAGNOSIS — E1142 Type 2 diabetes mellitus with diabetic polyneuropathy: Secondary | ICD-10-CM

## 2021-06-27 DIAGNOSIS — M25511 Pain in right shoulder: Secondary | ICD-10-CM | POA: Diagnosis not present

## 2021-06-27 DIAGNOSIS — R809 Proteinuria, unspecified: Secondary | ICD-10-CM | POA: Diagnosis not present

## 2021-06-27 DIAGNOSIS — E1129 Type 2 diabetes mellitus with other diabetic kidney complication: Secondary | ICD-10-CM

## 2021-06-27 DIAGNOSIS — Z794 Long term (current) use of insulin: Secondary | ICD-10-CM | POA: Diagnosis not present

## 2021-06-27 MED ORDER — GABAPENTIN 100 MG PO CAPS: 200.0000 mg | ORAL_CAPSULE | Freq: Three times a day (TID) | ORAL | 3 refills | Status: DC

## 2021-06-27 NOTE — Patient Instructions (Signed)
Thank you, [name] [name] for allowing [place] to provide your care today.   Referrals ordered today:   Referral Orders         Ambulatory referral to Ophthalmology         Ambulatory referral to Physical Therapy       I have ordered the following medication/changed the following medications:   Start the following medications: Meds ordered this encounter  Medications   gabapentin (NEURONTIN) 100 MG capsule    Sig: Take 2 capsules (200 mg total) by mouth 3 (three) times daily.    Dispense:  180 capsule    Refill:  3     Follow up: 1 month  Remember: Continue taking all your medications as directed  Should you have any questions or concerns please call the internal medicine clinic at [phone].    Timothy Lasso, MD [place]

## 2021-06-27 NOTE — Assessment & Plan Note (Addendum)
Seen in Urgent Care for right shoulder pain. Imaging not indicated at that time. He was prescribed mobic but states he did not pick up from pharmacy. Took aspirin for the pain with minimal relieve. No heating/cooling compress for the pain. The pain starts at the base of his shoulder and radiates down the arm. Described as gnawing pain. No numbness, burning or tingling. No neck pain or injuries to the neck. Ambulates with a cane on right side.  On physical exam, full range of motion of the right shoulder.  5 out of 5 strength bilaterally of upper extremities; sensation intact.  Spurling test negative.  Full range of motion of neck without pain.  Range of motion maneuvers do not elicit pain.  Differentials include muscle strain versus bursitis versus impingement.  Patient denies any recent heavy lifting placing muscle strain lower on the differential.  Due to having full range of motion of the shoulder, bursitis less likely.  Due to patient describing shooting like pain down the arm, nerve impingement was considered.  However physical exam findings are not consistent with cervical radiculopathy.  Given history of type 2 diabetes and osteoarthritis, patient likely experiencing neuropathy.  He is currently on gabapentin 100 mg 3 times daily.  Would recommend increasing gabapentin and referral to physical therapy.  P: -Increase gabapentin 200 mg 3 times daily -Referral to physical therapy -Follow-up in 2 weeks

## 2021-06-28 ENCOUNTER — Telehealth: Payer: Self-pay

## 2021-06-28 ENCOUNTER — Encounter: Payer: Self-pay | Admitting: Internal Medicine

## 2021-06-28 NOTE — Telephone Encounter (Signed)
Patient called in upset that he is unable to p/u Rx for gabapentin. Rx was sent to pharmacy yesterday, however, Mal Misty, Pharm Tech at [facility], [employer] will not cover med till 6/11. [name] spoke with patient's daughter who understood.

## 2021-06-28 NOTE — Telephone Encounter (Signed)
Pt called stated that he went to  get his ( gabapentin )  from the pharmacy  and they told him that it was not there I checked the chart and saw where it was sent over on 06/27/21 by Dr Jeanice Lim .[name] so I called the pharmacy because he started to yell at me stating he just called the pharmacy and I don't know what I am saying ... when I called I made sure it was an conference  call so he could her what the tech and myself were saying ... the tech explained that Mr Lurry had already picked up his medication I asked if it was the one with the sig ( take 2 caps by mouth 3 times daily ) she stated yes.. mr Arciniega then started yelling again I explained to him he can not be rude to people he needs to listen so he can understand what she is saying that his insurance will not cover the medication if it is picked up before 6/11  or it will be out of pocket ... pt got upset and hung up on both of [place] .[name] I then transferred the call the the triage RN ( lauren )  so the tech can explain to her

## 2021-07-02 NOTE — Progress Notes (Signed)
CC: Right shoulder pain  HPI:  [name] [name] is a 88 y.o. male with a past medical history stated below and presents today for cc listed above. Please see problem based assessment and plan for additional details.  Past Medical History:  Diagnosis Date   Benign prostatic hyperplasia (BPH) with urinary urge incontinence 11/13/2017   Chronic left shoulder pain 11/13/2017   Chronic non-seasonal allergic rhinitis 8/78/6767   Chronic systolic heart failure ([place]) 10/31/2016   Echo (05/03/2015): LVEF 20-94%, grade 2 diastolic dysfunction   Coronary artery disease involving native coronary artery of native heart without angina pectoris 09/30/2007   s/p CABG on 01/05/2013: left internal mammary artery to left anterior descending, saphenous vein graft to obtuse marginal 1, sequential saphenous vein graft to acute marginal and posterior descending   Essential hypertension 11/04/2005   Gastroesophageal reflux disease with hiatal hernia 11/04/2005   History of prostate cancer 02/17/2006   Diagnosed 1995, s/p seed implantation in the late 1990's, PSA undetectable 04/10/2016   Hyperlipidemia 11/04/2005   Obesity (BMI 30.0-34.9) 11/13/2017   Peripheral arterial disease ([place]) 09/30/2007   with claudication, failed attempt at LLE PTCA   Type 2 diabetes mellitus with microalbuminuria, with long-term current use of insulin ([place]) 11/13/2017   Type 2 diabetes mellitus with mild 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, bilateral ([place]) 03/24/2018   Type 2 diabetes mellitus with peripheral neuropathy ([place]) 11/04/2005   Urethral stricture in male 11/13/2017   Noted on cystoscopy 12/18/2014.  Reportedly asked to do chronic intermittent self catheterizations, but has not.    Current Outpatient Medications on File Prior to Visit  Medication Sig Dispense Refill   Accu-Chek Softclix Lancets lancets Use to check blood sugar twice a day 100 each 0   allopurinol (ZYLOPRIM) 100 MG tablet Take 2  tablets (200 mg total) by mouth daily. 180 tablet 3   amLODipine (NORVASC) 10 MG tablet Take 1 tablet (10 mg total) by mouth daily. 90 tablet 3   aspirin EC 81 MG tablet Take 1 tablet (81 mg total) by mouth daily.     atorvastatin (LIPITOR) 40 MG tablet Take 1 tablet (40 mg total) by mouth daily. 90 tablet 3   Blood Glucose Monitoring Suppl (ACCU-CHEK GUIDE ME) w/Device KIT Use to check blood sugar twice a day 1 kit 0   colchicine 0.6 MG tablet Take 1 tablet by mouth twice daily 60 tablet 0   furosemide (LASIX) 20 MG tablet Take 1 tablet by mouth once daily 90 tablet 3   glucose blood (ACCU-CHEK GUIDE) test strip USE TO CHECK BLOOD SUGAR TWICE DAILY 100 each 0   insulin NPH Human (NOVOLIN N) 100 UNIT/ML injection Inject 0.2 mLs (20 Units total) into the skin daily before breakfast. Per Dr [name] OV note of 11/17/19 10 mL 3   insulin regular (NOVOLIN R RELION) 100 units/mL injection Inject 0.12 mLs (12 Units total) into the skin 2 (two) times daily before a meal. Before breakfast and supper 7.5 mL 11   Insulin Syringe-Needle U-100 31G X 15/64" 0.3 ML MISC Use to inject insulin 3 times a day 300 each 3   lactulose (CHRONULAC) 10 GM/15ML solution Take 15 mLs (10 g total) by mouth daily. 946 mL 2   loratadine (EQ LORATADINE) 10 MG tablet TAKE 1 TABLET BY MOUTH DAILY AS NEEDED FOR RHINITIS. Strength: 10 mg 90 tablet 3   losartan (COZAAR) 50 MG tablet Take 1 tablet (50 mg total) by mouth daily. 90 tablet 3   meloxicam (  MOBIC) 15 MG tablet Take 1 tablet (15 mg total) by mouth daily. 7 tablet 0   metoprolol tartrate (LOPRESSOR) 25 MG tablet Take 2 tablets (50 mg total) by mouth 2 (two) times daily. 180 tablet 1   pantoprazole (PROTONIX) 40 MG tablet TAKE 1 TABLET BY MOUTH TWICE DAILY 30  MINUTES  BEFORE  MEALS 180 tablet 0   senna-docusate (SENOKOT S) 8.6-50 MG tablet Take 2 tablets by mouth 2 (two) times daily. 90 tablet 0   tamsulosin (FLOMAX) 0.4 MG CAPS capsule Take 1 capsule (0.4 mg total) by mouth  daily. 90 capsule 3   trospium (SANCTURA) 20 MG tablet Take 20 mg by mouth in the morning and at bedtime.     [DISCONTINUED] insulin glargine-yfgn (SEMGLEE) 100 UNIT/ML Pen Inject 20 Units into the skin 2 (two) times daily. Can you message me [name]) with cost after running this. Patient has issues with affordability. Not sure if this is preferred on plan. (Patient taking differently: Inject 20 Units into the skin 2 (two) times daily.) 15 mL 1   No current facility-administered medications on file prior to visit.    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  Tobacco Use   Smoking status: Former    Packs/day: 0.75    Years: 20.00    Pack years: 15.00    Types: Cigarettes    Quit date: 07/08/1980    Years since quitting: 41.0   Smokeless tobacco: Never  Vaping Use   Vaping Use: Never used  Substance and Sexual Activity   Alcohol use: No    Alcohol/week: 0.0 standard drinks    Comment: 12/07/2012 "quit drinking alcohol > 25 yr ago"   Drug use: No   Sexual activity: Not Currently  Other Topics Concern   Not on file  Social History Narrative   Occupation: Company secretary   Married   Social Determinants of Radio broadcast assistant Strain: Not on file  Food Insecurity: Not on file  Transportation Needs: Not on file  Physical Activity: Not on file  Stress: Not on file  Social Connections: Not  on file  Intimate Partner Violence: Not on file    Review of Systems: ROS negative except for what is noted on the assessment and plan.  Vitals:   06/27/21 1520  BP: 137/62  Pulse: 83  SpO2: 99%  Weight: 264 lb 8 oz (120 kg)     Physical Exam: Constitutional: normal-appearing elderly man sitting in the chair, in no acute distress HENT: normocephalic atraumatic, mucous membranes moist Eyes: conjunctiva non-erythematous Neck: supple; Full ROM w/o pain Cardiovascular: regular rate and rhythm, no m/r/g; radial pulse intact Pulmonary/Chest: normal work of  breathing on room air, lungs clear to auscultation bilaterally Abdominal: soft, non-tender, non-distended MSK: normal bulk and tone; Spurling test negative; ROM of Right upper extremity intact Neurological: alert & oriented x 3, 5/5 strength in bilateral upper and lower extremities, Sensation intact of upper extremities bilaterally abnormal gait Skin: warm and dry Psych: Normal behavior   Assessment & Plan:   See Encounters Tab for problem based charting.  Patient discussed with Dr. Blenda Bridegroom, M.D. [facility], PGY-1 Pager: [phone], Phone: [phone] Date 07/02/2021 Time 8:26 AM

## 2021-07-02 NOTE — Assessment & Plan Note (Signed)
Recently seen a week ago and A1c was at goal of 8%.  Metformin was discontinued and patient was to continue his new insulin regimen.  Patient is compliant with medication and denies any complaints at this time.  Patient was due for diabetic foot exam which was obtained during office visit.  Referral to ophthalmology for diabetic eye exam was also sent.

## 2021-07-03 NOTE — Progress Notes (Signed)
Internal Medicine Clinic Attending ? ?Case discussed with Dr. Ariwodo  At the time of the visit.  We reviewed the resident?s history and exam and pertinent patient test results.  I agree with the assessment, diagnosis, and plan of care documented in the resident?s note.  ?

## 2021-07-05 ENCOUNTER — Other Ambulatory Visit: Payer: Self-pay | Admitting: Student in an Organized Health Care Education/Training Program

## 2021-07-05 DIAGNOSIS — E119 Type 2 diabetes mellitus without complications: Secondary | ICD-10-CM | POA: Diagnosis not present

## 2021-07-05 LAB — HM DIABETES EYE EXAM

## 2021-07-10 DIAGNOSIS — L309 Dermatitis, unspecified: Secondary | ICD-10-CM | POA: Diagnosis not present

## 2021-07-10 DIAGNOSIS — N390 Urinary tract infection, site not specified: Secondary | ICD-10-CM | POA: Diagnosis not present

## 2021-07-10 DIAGNOSIS — N99111 Postprocedural bulbous urethral stricture: Secondary | ICD-10-CM | POA: Diagnosis not present

## 2021-07-14 NOTE — Therapy (Unsigned)
OUTPATIENT PHYSICAL THERAPY SHOULDER EVALUATION   Patient Name: [name] [name] MRN: [id number] DOB:April 20, 1931, 88 y.o., male Today's Date: 07/15/2021   PT End of Session - 07/15/21 1525     Visit Number 1    Number of Visits 6    Date for PT Re-Evaluation 09/09/21    Authorization Type [employer] MCR    PT Start Time 3664    PT Stop Time 4034    PT Time Calculation (min) 40 min    Activity Tolerance Patient tolerated treatment well    Behavior During Therapy [facility] for tasks assessed/performed             Past Medical History:  Diagnosis Date   Benign prostatic hyperplasia (BPH) with urinary urge incontinence 11/13/2017   Chronic left shoulder pain 11/13/2017   Chronic non-seasonal allergic rhinitis 7/42/5956   Chronic systolic heart failure ([place]) 10/31/2016   Echo (05/03/2015): LVEF 38-75%, grade 2 diastolic dysfunction   Coronary artery disease involving native coronary artery of native heart without angina pectoris 09/30/2007   s/p CABG on 01/05/2013: left internal mammary artery to left anterior descending, saphenous vein graft to obtuse marginal 1, sequential saphenous vein graft to acute marginal and posterior descending   Essential hypertension 11/04/2005   Gastroesophageal reflux disease with hiatal hernia 11/04/2005   History of prostate cancer 02/17/2006   Diagnosed 1995, s/p seed implantation in the late 1990's, PSA undetectable 04/10/2016   Hyperlipidemia 11/04/2005   Obesity (BMI 30.0-34.9) 11/13/2017   Peripheral arterial disease ([place]) 09/30/2007   with claudication, failed attempt at LLE PTCA   Type 2 diabetes mellitus with microalbuminuria, with long-term current use of insulin ([place]) 11/13/2017   Type 2 diabetes mellitus with mild 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, bilateral ([place]) 03/24/2018   Type 2 diabetes mellitus with peripheral neuropathy ([place]) 11/04/2005   Urethral stricture in male 11/13/2017   Noted on cystoscopy 12/18/2014.  Reportedly  asked to do chronic intermittent self catheterizations, but has not.   Past Surgical History:  Procedure Laterality Date   CARDIAC CATHETERIZATION  10/05/08   REVEALS HYPOKINESIS OF THE LATERAL WALL AND EF 50-55%   CORONARY ANGIOPLASTY WITH STENT PLACEMENT     CORONARY ARTERY BYPASS GRAFT N/A 01/05/2013   Procedure: CORONARY ARTERY BYPASS GRAFTING (CABG);  Surgeon: Melrose Nakayama, MD;  Location: [facility];  Service: Open Heart Surgery;  Laterality: N/A;   INTRAOPERATIVE TRANSESOPHAGEAL ECHOCARDIOGRAM N/A 01/05/2013   Procedure: INTRAOPERATIVE TRANSESOPHAGEAL ECHOCARDIOGRAM;  Surgeon: Melrose Nakayama, MD;  Location: [facility];  Service: Open Heart Surgery;  Laterality: N/A;   LEFT HEART CATHETERIZATION WITH CORONARY ANGIOGRAM N/A 01/03/2013   Procedure: LEFT HEART CATHETERIZATION WITH CORONARY ANGIOGRAM;  Surgeon: Lorretta Harp, MD;  Location: [facility] CATH LAB;  Service: Cardiovascular;  Laterality: N/A;   LOWER EXTREMITY ANGIOGRAM Right 12/07/2012   unsuccessful attempt at percutaneous revascularization of a calcified long segment chronic total occlusion mid left SFA /notes 12/07/2012   LOWER EXTREMITY ANGIOGRAM N/A 12/07/2012   Procedure: LOWER EXTREMITY ANGIOGRAM;  Surgeon: Lorretta Harp, MD;  Location: [facility] CATH LAB;  Service: Cardiovascular;  Laterality: N/A;   PROSTATE SURGERY  1990's   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) 12/19/2020   Mild cognitive impairment 06/25/2020   Esophageal dysphagia 04/24/2020   Chronic gout 05/27/2018   Constipation, unspecified 03/26/2018   Type  2 diabetes mellitus with mild 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, bilateral ([place]) 03/24/2018   Type 2 diabetes mellitus with microalbuminuria, with long-term current use of insulin ([place]) 11/13/2017   Healthcare maintenance  11/13/2017   Chronic left shoulder pain 11/13/2017   Severe obesity (BMI 35.0-35.9 with comorbidity) ([place]) 11/13/2017   Urethral stricture in male 11/13/2017   Benign prostatic hyperplasia (BPH) with urinary urge incontinence 11/13/2017   Chronic combined systolic (congestive) and diastolic (congestive) heart failure ([place]) 10/31/2016   Arthritis 12/19/2014   Coronary artery disease involving native coronary artery of native heart without angina pectoris 09/30/2007   Peripheral arterial disease ([place]) 09/30/2007   Chronic non-seasonal allergic rhinitis 02/17/2006   History of prostate cancer 02/17/2006   Type 2 diabetes mellitus with peripheral neuropathy ([place]) 11/04/2005   Hyperlipidemia 11/04/2005   Essential hypertension 11/04/2005   Gastroesophageal reflux disease with hiatal hernia 11/04/2005    PCP: [name], MD  REFERRING PROVIDER: Axel Filler, MD  REFERRING DIAG: M25.511 (ICD-10-CM) - Acute pain of right shoulder   THERAPY DIAG: Acute pain of right shoulder    Rationale for Evaluation and Treatment Rehabilitation  ONSET DATE: 06/24/21  SUBJECTIVE:                                                                                                                                                                                      SUBJECTIVE STATEMENT: Onset of R shoulder pain, insidious.  Pain levels high with some radiating symptoms to arm not extending past elbow.  PERTINENT HISTORY: Seen in Urgent Care for right shoulder pain. Imaging not indicated at that time. He was prescribed mobic but states he did not pick up from pharmacy. Took aspirin for the pain with minimal relieve. No heating/cooling compress for the pain. The pain starts at the base of his shoulder and radiates down the arm. Described as gnawing pain. No numbness, burning or tingling. No neck pain or injuries to the neck. Ambulates with a cane on right side.  On physical exam, full range of  motion of the right shoulder.  5 out of 5 strength bilaterally of upper extremities; sensation intact.  Spurling test negative.  Full range of motion of neck without pain.  Range of motion maneuvers do not elicit pain.  Differentials include muscle strain versus bursitis versus impingement.  Patient denies any recent heavy lifting placing muscle strain lower on the differential.  Due to having full range of motion of the shoulder, bursitis less likely.  Due to patient describing shooting like pain down the arm, nerve impingement was considered.  However physical exam findings are not consistent with cervical radiculopathy.  Given history of type 2 diabetes and osteoarthritis, patient likely experiencing neuropathy.  He is currently on gabapentin 100 mg 3 times daily.  Would recommend increasing gabapentin and referral to physical therapy.  PAIN:  Are you having pain? Yes 10/10, worse with cold, relieving factors undermined, described as an ache  PRECAUTIONS: None  WEIGHT BEARING RESTRICTIONS No  FALLS:  Has patient fallen in last 6 months? No  LIVING ENVIRONMENT: Lives with: lives with their family Lives in: House/apartment Stairs:  yes Has following equipment at home: Single point cane  OCCUPATION: retired  PLOF: Independent  PATIENT GOALS To reduce and manage my shoulder pain  OBJECTIVE:   DIAGNOSTIC FINDINGS:  None noted  PATIENT SURVEYS:  FOTO patient unable to complete  and became frustrated at question phrasing even with PT assist  COGNITION:  Overall cognitive status: Within functional limits for tasks assessed     SENSATION: Not tested  POSTURE: Depressed R shoulder  UPPER EXTREMITY ROM:   Active ROM Right eval Left eval  Shoulder flexion 160d   Shoulder extension 30d   Shoulder abduction 160d   Shoulder adduction    Shoulder internal rotation WFL   Shoulder external rotation WFL   Elbow flexion WFL   Elbow extension WFL   Wrist flexion    Wrist  extension    Wrist ulnar deviation    Wrist radial deviation    Wrist pronation    Wrist supination    (Blank rows = not tested)  UPPER EXTREMITY MMT:  MMT Right eval Left eval  Shoulder flexion 4+   Shoulder extension 4+   Shoulder abduction 4+   Shoulder adduction    Shoulder internal rotation 4+   Shoulder external rotation 4+   Middle trapezius    Lower trapezius    Elbow flexion    Elbow extension    Wrist flexion    Wrist extension    Wrist ulnar deviation    Wrist radial deviation    Wrist pronation    Wrist supination    Grip strength (lbs)    (Blank rows = not tested)  SHOULDER SPECIAL TESTS:  Impingement tests: Neer impingement test: negative, Hawkins/Kennedy impingement test: negative, and Painful arc test: negative  Rotator cuff assessment: Drop arm test: negative, Empty can test: negative, Full can test: negative, and Hornblower's sign: negative  JOINT MOBILITY TESTING:  WFL  PALPATION:  TTP R AC joint   TODAY'S TREATMENT:  Eval and HEP   PATIENT EDUCATION: Education details: Discussed eval findings, rehab rationale and POC and patient is in agreement  Person educated: Patient Education method: Explanation, Demonstration, and Handouts Education comprehension: verbalized understanding and returned demonstration   HOME EXERCISE PROGRAM: Access Code: [email or web address] URL: [email or web address] Date: 07/15/2021 Prepared by: Sharlynn Oliphant  Exercises - Standing Shoulder Scaption  - 2 x daily - 7 x weekly - 1 sets - 10 reps - Supine Shoulder Flexion Extension Full Range AROM  - 2 x daily - 7 x weekly - 1 sets - 10 reps - Supine Single Arm Shoulder Protraction  - 2 x daily - 7 x weekly - 1 sets - 10 reps  ASSESSMENT:  CLINICAL IMPRESSION: Patient is a 88 y.o. male  who was seen today for physical therapy evaluation and treatment for R shoulder pain.  Patient presents with equal B AROM, only mild discomfort at end ranges, no joint laxity,  no impingement signs, RC appears intact.      OBJECTIVE IMPAIRMENTS decreased activity tolerance, decreased knowledge  of condition, decreased strength, impaired UE functional use, postural dysfunction, and pain.   ACTIVITY LIMITATIONS carrying, lifting, and dressing  PERSONAL FACTORS Age, Fitness, and 1 comorbidity: DM  are also affecting patient's functional outcome.   REHAB POTENTIAL: Good  CLINICAL DECISION MAKING: Stable/uncomplicated  EVALUATION COMPLEXITY: Low   GOALS: Goals reviewed with patient? Yes  SHORT TERM GOALS: Target date: 08/06/21 Patient to demonstrate independence in HEP  Baseline: [id number] Goal status: INITIAL  2.  6/10 pain at worst Baseline: 10/10 pain at worst Goal status: INITIAL  LONG TERM GOALS: Target date: 08/26/2021    4/10 worst pain Baseline: 10/10 worst pain Goal status: INITIAL  2.  5/5 R shoulder strength Baseline:  MMT Right eval  Shoulder flexion 4+  Shoulder extension 4+  Shoulder abduction 4+  Shoulder adduction   Shoulder internal rotation 4+  Shoulder external rotation 4+   Goal status: INITIAL  3.  Correct depressed R shoulder posture due to pain Baseline: Depressed R shoulder posture Goal status: INITIAL    PLAN: PT FREQUENCY: 2x/week  PT DURATION: 4 weeks  PLANNED INTERVENTIONS: Therapeutic exercises, Therapeutic activity, Neuromuscular re-education, Balance training, Gait training, Patient/Family education, Joint mobilization, Dry Needling, Ionotophoresis '4mg'$ /ml Dexamethasone, Manual therapy, and Re-evaluation  PLAN FOR NEXT SESSION: HEP review, ionto to R AC joint, R shoulder ROM and strengthening.   Lanice Shirts, PT 07/15/2021, 3:28 PM   Referring diagnosis? R shoulder pain Treatment diagnosis? (if different than referring diagnosis) R AC joint sprain What was this (referring dx) caused by? '[]'$  Surgery '[]'$  Fall '[]'$  Ongoing issue '[]'$  Arthritis '[x]'$  Other: ____________  Laterality: '[x]'$  Rt '[]'$  Lt '[]'$   Both  Check all possible CPT codes:  *CHOOSE 10 OR LESS*    '[x]'$  97110 (Therapeutic Exercise)  '[]'$  92507 (SLP Treatment)  '[x]'$  97112 (Neuro Re-ed)   '[]'$  92526 (Swallowing Treatment)   '[]'$  66063 (Gait Training)   '[]'$  [id number] (Cognitive Training, 1st 15 minutes) '[x]'$  97140 (Manual Therapy)   '[]'$  97130 (Cognitive Training, each add'l 15 minutes)  '[x]'$  97164 (Re-evaluation)                              '[]'$  Other, List CPT Code ____________  '[x]'$  97530 (Therapeutic Activities)     '[x]'$  97535 (Self Care)   '[]'$  All codes above (97110 - 97535)  '[]'$  97012 (Mechanical Traction)  '[]'$  97014 (E-stim Unattended)  '[]'$  97032 (E-stim manual)  '[x]'$  97033 (Ionto)  '[]'$  97035 (Ultrasound) '[]'$  97750 (Physical Performance Training) '[]'$  [id number] (Aquatic Therapy) '[]'$  97016 (Vasopneumatic Device) '[]'$  [id number] (Paraffin) '[]'$  97034 (Contrast Bath) '[]'$  97597 (Wound Care 1st 20 sq cm) '[]'$  97598 (Wound Care each add'l 20 sq cm) '[]'$  97760 (Orthotic Fabrication, Fitting, Training Initial) '[]'$  [id number] (Prosthetic Management and Training Initial) '[]'$  [id number] (Orthotic or Prosthetic Training/ Modification Subsequent)

## 2021-07-15 ENCOUNTER — Ambulatory Visit: Payer: Medicare PPO | Attending: Student in an Organized Health Care Education/Training Program

## 2021-07-15 DIAGNOSIS — M6281 Muscle weakness (generalized): Secondary | ICD-10-CM | POA: Insufficient documentation

## 2021-07-15 DIAGNOSIS — M25511 Pain in right shoulder: Secondary | ICD-10-CM | POA: Insufficient documentation

## 2021-07-15 DIAGNOSIS — M7541 Impingement syndrome of right shoulder: Secondary | ICD-10-CM | POA: Diagnosis not present

## 2021-07-16 ENCOUNTER — Telehealth: Payer: Medicare PPO

## 2021-07-16 ENCOUNTER — Telehealth: Payer: Self-pay

## 2021-07-16 NOTE — Telephone Encounter (Signed)
  Care Management   Outreach Note  07/16/2021 Name: [name] [name] MRN: [id number] DOB: Dec 03, 1931  Referred by: [name], MD Reason for referral : Chronic Care Management   An unsuccessful telephone outreach was attempted today. The patient was referred to the case management team for assistance with care management and care coordination.   Follow Up Plan: The patient has been provided with contact information for the care management team and has been advised to call with any health related questions or concerns.   [name], RN, BSN, CCM Care Management Coordinator [facility] Internal Medicine Phone: [phone]: [phone]

## 2021-07-22 ENCOUNTER — Other Ambulatory Visit: Payer: Self-pay | Admitting: Endocrinology

## 2021-07-22 ENCOUNTER — Telehealth: Payer: Self-pay | Admitting: Internal Medicine

## 2021-07-22 DIAGNOSIS — E1142 Type 2 diabetes mellitus with diabetic polyneuropathy: Secondary | ICD-10-CM

## 2021-07-22 NOTE — Telephone Encounter (Signed)
Thank you Glenda.  I know Dr. Eulas Post will get this figured out.

## 2021-07-22 NOTE — Telephone Encounter (Signed)
Pt requesting a call back.  Pt states he was given the wrong medications or a mix up has happened and he needs help now.  The patient sates,( My BP is to high and yall need to fix this problem now!!!)

## 2021-07-22 NOTE — Telephone Encounter (Signed)
Return pt's call. He stated he was on 2 insulins now on 1 per Dr Daryll Drown.  And the pharmacy gave him the wrong one - unable to state the name. Stated he was on a clear and a cloudy ; now only on the clear and this is not controlling his BS's. And he has a new meter and the strips cost $55 in which he cannot afford. He agreed to schedule an appt; appt schedule with Dr Lucila Maine tomorrow 6/20 @ 1045AM and instructed to bring all his meds with him.

## 2021-07-23 ENCOUNTER — Ambulatory Visit (INDEPENDENT_AMBULATORY_CARE_PROVIDER_SITE_OTHER): Payer: Medicare PPO | Admitting: Internal Medicine

## 2021-07-23 ENCOUNTER — Other Ambulatory Visit: Payer: Self-pay

## 2021-07-23 ENCOUNTER — Encounter: Payer: Self-pay | Admitting: Internal Medicine

## 2021-07-23 ENCOUNTER — Ambulatory Visit (HOSPITAL_COMMUNITY): Payer: Medicare PPO | Attending: Internal Medicine

## 2021-07-23 VITALS — BP 138/62 | HR 77 | Temp 98.2°F | Ht 74.0 in | Wt 257.7 lb

## 2021-07-23 DIAGNOSIS — Z87891 Personal history of nicotine dependence: Secondary | ICD-10-CM

## 2021-07-23 DIAGNOSIS — E1142 Type 2 diabetes mellitus with diabetic polyneuropathy: Secondary | ICD-10-CM | POA: Diagnosis not present

## 2021-07-23 DIAGNOSIS — I491 Atrial premature depolarization: Secondary | ICD-10-CM | POA: Diagnosis not present

## 2021-07-23 DIAGNOSIS — I499 Cardiac arrhythmia, unspecified: Secondary | ICD-10-CM | POA: Diagnosis not present

## 2021-07-23 DIAGNOSIS — M25511 Pain in right shoulder: Secondary | ICD-10-CM | POA: Diagnosis not present

## 2021-07-23 DIAGNOSIS — E113293 Type 2 diabetes mellitus with mild 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, bilateral: Secondary | ICD-10-CM

## 2021-07-23 DIAGNOSIS — Z794 Long term (current) use of insulin: Secondary | ICD-10-CM | POA: Diagnosis not present

## 2021-07-23 MED ORDER — ACCU-CHEK GUIDE VI STRP: ORAL_STRIP | 0 refills | Status: DC

## 2021-07-23 MED ORDER — ACCU-CHEK GUIDE VI STRP: ORAL_STRIP | 3 refills | Status: DC

## 2021-07-23 MED ORDER — DICLOFENAC SODIUM 1 % EX GEL: 2.0000 g | Freq: Four times a day (QID) | CUTANEOUS | Status: DC

## 2021-07-23 NOTE — Progress Notes (Unsigned)
CC: medication management  HPI:  [name] is a 88 y.o. with medical history as below presenting to [facility] for medication management.  Please see problem-based list for further details, assessments, and plans.  Past Medical History:  Diagnosis Date   Benign prostatic hyperplasia (BPH) with urinary urge incontinence 11/13/2017   Chronic left shoulder pain 11/13/2017   Chronic non-seasonal allergic rhinitis 2/68/3419   Chronic systolic heart failure ([place]) 10/31/2016   Echo (05/03/2015): LVEF 62-22%, grade 2 diastolic dysfunction   Coronary artery disease involving native coronary artery of native heart without angina pectoris 09/30/2007   s/p CABG on 01/05/2013: left internal mammary artery to left anterior descending, saphenous vein graft to obtuse marginal 1, sequential saphenous vein graft to acute marginal and posterior descending   Essential hypertension 11/04/2005   Gastroesophageal reflux disease with hiatal hernia 11/04/2005   History of prostate cancer 02/17/2006   Diagnosed 1995, s/p seed implantation in the late 1990's, PSA undetectable 04/10/2016   Hyperlipidemia 11/04/2005   Obesity (BMI 30.0-34.9) 11/13/2017   Peripheral arterial disease ([place]) 09/30/2007   with claudication, failed attempt at LLE PTCA   Type 2 diabetes mellitus with microalbuminuria, with long-term current use of insulin ([place]) 11/13/2017   Type 2 diabetes mellitus with mild 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, bilateral ([place]) 03/24/2018   Type 2 diabetes mellitus with peripheral neuropathy ([place]) 11/04/2005   Urethral stricture in male 11/13/2017   Noted on cystoscopy 12/18/2014.  Reportedly asked to do chronic intermittent self catheterizations, but has not.    Current Outpatient Medications (Endocrine & Metabolic):    insulin NPH Human (NOVOLIN N) 100 UNIT/ML injection, Inject 0.2 mLs (20 Units total) into the skin daily before breakfast. Per Dr [name] OV note of 11/17/19    insulin regular (NOVOLIN R RELION) 100 units/mL injection, Inject 0.12 mLs (12 Units total) into the skin 2 (two) times daily before a meal. Before breakfast and supper  Current Outpatient Medications (Cardiovascular):    amLODipine (NORVASC) 10 MG tablet, Take 1 tablet (10 mg total) by mouth daily.   atorvastatin (LIPITOR) 40 MG tablet, Take 1 tablet (40 mg total) by mouth daily.   furosemide (LASIX) 20 MG tablet, Take 1 tablet by mouth once daily   losartan (COZAAR) 50 MG tablet, Take 1 tablet (50 mg total) by mouth daily.   metoprolol tartrate (LOPRESSOR) 25 MG tablet, Take 2 tablets (50 mg total) by mouth 2 (two) times daily.  Current Outpatient Medications (Respiratory):    loratadine (EQ LORATADINE) 10 MG tablet, TAKE 1 TABLET BY MOUTH DAILY AS NEEDED FOR RHINITIS. Strength: 10 mg  Current Outpatient Medications (Analgesics):    allopurinol (ZYLOPRIM) 100 MG tablet, Take 2 tablets (200 mg total) by mouth daily.   aspirin EC 81 MG tablet, Take 1 tablet (81 mg total) by mouth daily.   colchicine 0.6 MG tablet, Take 1 tablet by mouth twice daily   meloxicam (MOBIC) 15 MG tablet, Take 1 tablet (15 mg total) by mouth daily.   Current Outpatient Medications (Other):    Accu-Chek Softclix Lancets lancets, Use to check blood sugar twice a day   Blood Glucose Monitoring Suppl (ACCU-CHEK GUIDE ME) w/Device KIT, Use to check blood sugar twice a day   gabapentin (NEURONTIN) 100 MG capsule, Take 2 capsules (200 mg total) by mouth 3 (three) times daily.   glucose blood (ACCU-CHEK GUIDE) test strip, USE TO CHECK BLOOD SUGAR TWICE DAILY   Insulin Syringe-Needle U-100 31G X 15/64" 0.3 ML MISC, Use  to inject insulin 3 times a day   lactulose (CHRONULAC) 10 GM/15ML solution, Take 15 mLs (10 g total) by mouth daily.   pantoprazole (PROTONIX) 40 MG tablet, TAKE 1 TABLET BY MOUTH TWICE DAILY 30  MINUTES  BEFORE  MEALS   senna-docusate (SENOKOT S) 8.6-50 MG tablet, Take 2 tablets by mouth 2 (two) times  daily.   tamsulosin (FLOMAX) 0.4 MG CAPS capsule, Take 1 capsule (0.4 mg total) by mouth daily.   trospium (SANCTURA) 20 MG tablet, Take 20 mg by mouth in the morning and at bedtime.  Review of Systems:  Review of system negative unless stated in the problem list or HPI.    Physical Exam:  Vitals:   07/23/21 1034 07/23/21 1036  BP:  138/62  Pulse:  77  Temp:  98.2 F (36.8 C)  TempSrc:  Oral  SpO2:  100%  Weight: 257 lb 11.2 oz (116.9 kg)   Height: $Remove'6\' 2"'QnsuqCb$  (1.88 m)     Physical Exam General: NAD HENT: NCAT Lungs: CTAB, no wheeze, rhonchi or rales.  Cardiovascular: Normal rate, irregular rhythm, no r/m/g, 2+ pulses in all extremities. No LE edema Abdomen: No TTP, normal bowel sounds MSK: No asymmetry or muscle atrophy.  Skin: no lesions noted on exposed skin Neuro: Alert and oriented x4. CN grossly intact Psych: Normal mood and normal affect   Assessment & Plan:   See Encounters Tab for problem based charting.  Patient discussed with Dr. {NAMES:[id number]::"Butcher","Guilloud","Hoffman","Mullen","Narendra","Raines","Vincent"} [name], MD   PMHx of HTN, DMII, HLD, GERD, and Gout

## 2021-07-23 NOTE — Patient Instructions (Addendum)
[name] [name], it was a pleasure seeing you today! You endorsed feeling well today. Below are some of the things we talked about this visit. We look forward to seeing you in the follow up appointment!  Today we discussed: You presented for a follow-up on your diabetes.  You are concerned about your insulin.  You should be taking 20 units of NPH insulin in the morning.  This is your cloudy insulin.  You should be taking 12 units of the regular insulin which is the clear insulin with your lunch and dinner.  I would like to follow-up with you in 1 week to see how your sugars are doing and that they are not running too low. We went over some of your other medications and the reasons for them. For your shoulder pain continue with the physical therapy and you can pick up Voltaren gel and apply it to your shoulder as needed for pain up to 4 times a day.  This is available over-the-counter.  I have ordered the following labs today:  Lab Orders  No laboratory test(s) ordered today      Referrals ordered today:   Referral Orders  No referral(s) requested today     I have ordered the following medication/changed the following medications:   Stop the following medications: Medications Discontinued During This Encounter  Medication Reason   glucose blood (ACCU-CHEK GUIDE) test strip Reorder   glucose blood (ACCU-CHEK GUIDE) test strip      Start the following medications: Meds ordered this encounter  Medications   DISCONTD: glucose blood (ACCU-CHEK GUIDE) test strip    Sig: USE TO CHECK BLOOD SUGAR TWICE DAILY    Dispense:  100 each    Refill:  0   glucose blood (ACCU-CHEK GUIDE) test strip    Sig: USE TO CHECK BLOOD SUGAR THREE TIMES A DAY    Dispense:  100 each    Refill:  3   diclofenac Sodium (VOLTAREN) 1 % GEL    Sig: Apply 2 g topically 4 (four) times daily.     Follow-up: 1 week for diabetes  Please make sure to arrive 15 minutes prior to your next appointment. If you  arrive late, you may be asked to reschedule.   We look forward to seeing you next time. Please call our clinic at [phone] if you have any questions or concerns. The best time to call is Monday-Friday from 9am-4pm, but there is someone available 24/7. If after hours or the weekend, call the main hospital number and ask for the Internal Medicine Resident On-Call. If you need medication refills, please notify your pharmacy one week in advance and they will send [place] a request.  Thank you for letting us take part in your care. Wishing you the best!  Thank you, [name] Schuller, MD

## 2021-07-24 NOTE — Assessment & Plan Note (Signed)
Heart rate was irregular on auscultation.  EKG was performed to check for atrial fibrillation but patient's rhythm was sinus with premature contraction.  His heart rate was in normal range. - Continue to monitor

## 2021-07-24 NOTE — Assessment & Plan Note (Addendum)
Patient presented to the clinic after having questions regarding his diabetes medication.  He stated he was unsure if he was supposed to be taking 2 different insulins or just 1 insulin.  He was currently taking Novolin R 12 units twice daily and not taking Novolin N. I advised patient that he is supposed to be taking 2 different insulins and went over the timing of both of them with the patient.  Patient had all of his medications with him and I went over the indications and timing of these medications.  Patient reported that he was running out of his testing strips and needed refills. - Continue Novolin N 20 units before breakfast and Novolin R 12 units with lunch and dinner. - Refilled patient's testing strips. - Follow-up in 1 week for diabetes check.

## 2021-07-24 NOTE — Assessment & Plan Note (Addendum)
Patient is getting physical therapy for his right shoulder pain and states it is getting better.  For symptomatic management I recommended he use Voltaren gel up to 4 times a day as needed for pain.  This will allow him to participate more in physical therapy. - Start Voltaren gel up to 4 times a day as needed for pain - Continue gabapentin - Continue to work with physical therapy

## 2021-07-25 ENCOUNTER — Ambulatory Visit: Payer: Self-pay

## 2021-07-25 NOTE — Chronic Care Management (AMB) (Signed)
   07/25/2021  [name] [name] [birth year] [id number]  Care coordination case closed. Jodelle Gross, RN, BSN, CCM Care Management Coordinator [facility] Internal Medicine Phone: [phone]/Fax: [phone]

## 2021-07-26 ENCOUNTER — Encounter: Payer: Medicare PPO | Admitting: Internal Medicine

## 2021-07-29 ENCOUNTER — Ambulatory Visit: Payer: Medicare PPO

## 2021-07-29 NOTE — Therapy (Deleted)
OUTPATIENT PHYSICAL THERAPY TREATMENT NOTE   Patient Name: [name] [name] MRN: [id number] DOB:08-13-1931, 88 y.o., male Today's Date: 07/29/2021  PCP: [name], MD REFERRING PROVIDER: Axel Filler, MD  END OF SESSION:    Past Medical History:  Diagnosis Date   Benign prostatic hyperplasia (BPH) with urinary urge incontinence 11/13/2017   Chronic left shoulder pain 11/13/2017   Chronic non-seasonal allergic rhinitis 0/16/5537   Chronic systolic heart failure ([place]) 10/31/2016   Echo (05/03/2015): LVEF 48-27%, grade 2 diastolic dysfunction   Coronary artery disease involving native coronary artery of native heart without angina pectoris 09/30/2007   s/p CABG on 01/05/2013: left internal mammary artery to left anterior descending, saphenous vein graft to obtuse marginal 1, sequential saphenous vein graft to acute marginal and posterior descending   Essential hypertension 11/04/2005   Gastroesophageal reflux disease with hiatal hernia 11/04/2005   History of prostate cancer 02/17/2006   Diagnosed 1995, s/p seed implantation in the late 1990's, PSA undetectable 04/10/2016   Hyperlipidemia 11/04/2005   Obesity (BMI 30.0-34.9) 11/13/2017   Peripheral arterial disease ([place]) 09/30/2007   with claudication, failed attempt at LLE PTCA   Type 2 diabetes mellitus with microalbuminuria, with long-term current use of insulin ([place]) 11/13/2017   Type 2 diabetes mellitus with mild 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, bilateral ([place]) 03/24/2018   Type 2 diabetes mellitus with peripheral neuropathy ([place]) 11/04/2005   Urethral stricture in male 11/13/2017   Noted on cystoscopy 12/18/2014.  Reportedly asked to do chronic intermittent self catheterizations, but has not.   Past Surgical History:  Procedure Laterality Date   CARDIAC CATHETERIZATION  10/05/08   REVEALS HYPOKINESIS OF THE LATERAL WALL AND EF 50-55%   CORONARY ANGIOPLASTY WITH STENT PLACEMENT     CORONARY  ARTERY BYPASS GRAFT N/A 01/05/2013   Procedure: CORONARY ARTERY BYPASS GRAFTING (CABG);  Surgeon: Melrose Nakayama, MD;  Location: [place];  Service: Open Heart Surgery;  Laterality: N/A;   INTRAOPERATIVE TRANSESOPHAGEAL ECHOCARDIOGRAM N/A 01/05/2013   Procedure: INTRAOPERATIVE TRANSESOPHAGEAL ECHOCARDIOGRAM;  Surgeon: Melrose Nakayama, MD;  Location: [place];  Service: Open Heart Surgery;  Laterality: N/A;   LEFT HEART CATHETERIZATION WITH CORONARY ANGIOGRAM N/A 01/03/2013   Procedure: LEFT HEART CATHETERIZATION WITH CORONARY ANGIOGRAM;  Surgeon: Lorretta Harp, MD;  Location: [facility] CATH LAB;  Service: Cardiovascular;  Laterality: N/A;   LOWER EXTREMITY ANGIOGRAM Right 12/07/2012   unsuccessful attempt at percutaneous revascularization of a calcified long segment chronic total occlusion mid left SFA /notes 12/07/2012   LOWER EXTREMITY ANGIOGRAM N/A 12/07/2012   Procedure: LOWER EXTREMITY ANGIOGRAM;  Surgeon: Lorretta Harp, MD;  Location: [facility] C[facility] LAB;  Service: Cardiovascular;  Laterality: N/A;   PROSTATE SURGERY  1990's   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) 12/19/2020   Mild cognitive impairment 06/25/2020   Esophageal dysphagia 04/24/2020   Chronic gout 05/27/2018   Constipation, unspecified 03/26/2018   Type 2 diabetes mellitus with mild 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, bilateral ([place]) 03/24/2018   Type 2 diabetes mellitus with microalbuminuria, with long-term current use of insulin ([place]) 11/13/2017   Healthcare maintenance 11/13/2017   Chronic left shoulder pain 11/13/2017   Severe obesity (BMI 35.0-35.9 with comorbidity) ([place]) 11/13/2017   Urethral stricture in male 11/13/2017   Benign prostatic hyperplasia (BPH) with urinary urge incontinence 11/13/2017   Chronic  combined systolic  (congestive) and diastolic (congestive) heart failure ([place]) 10/31/2016   Arthritis 12/19/2014   Coronary artery disease involving native coronary artery of native heart without angina pectoris 09/30/2007   Peripheral arterial disease ([place]) 09/30/2007   Chronic non-seasonal allergic rhinitis 02/17/2006   History of prostate cancer 02/17/2006   Type 2 diabetes mellitus with peripheral neuropathy ([place]) 11/04/2005   Hyperlipidemia 11/04/2005   Essential hypertension 11/04/2005   Gastroesophageal reflux disease with hiatal hernia 11/04/2005    REFERRING DIAG: M25.511 (ICD-10-CM) - Acute pain of right shoulder   THERAPY DIAG: Acute pain of right shoulder    Rationale for Evaluation and Treatment Rehabilitation  PERTINENT HISTORY: Seen in Urgent Care for right shoulder pain. Imaging not indicated at that time. He was prescribed mobic but states he did not pick up from pharmacy. Took aspirin for the pain with minimal relieve. No heating/cooling compress for the pain. The pain starts at the base of his shoulder and radiates down the arm. Described as gnawing pain. No numbness, burning or tingling. No neck pain or injuries to the neck. Ambulates with a cane on right side.  On physical exam, full range of motion of the right shoulder.  5 out of 5 strength bilaterally of upper extremities; sensation intact.  Spurling test negative.  Full range of motion of neck without pain.  Range of motion maneuvers do not elicit pain.  Differentials include muscle strain versus bursitis versus impingement.  Patient denies any recent heavy lifting placing muscle strain lower on the differential.  Due to having full range of motion of the shoulder, bursitis less likely.  Due to patient describing shooting like pain down the arm, nerve impingement was considered.  However physical exam findings are not consistent with cervical radiculopathy.  Given history of type 2 diabetes and osteoarthritis, patient likely experiencing  neuropathy.  He is currently on gabapentin 100 mg 3 times daily.  Would recommend increasing gabapentin and referral to physical therapy.  PRECAUTIONS: None  SUBJECTIVE: ***  PAIN:  Are you having pain? {OPRCPAIN:[id number]}   OBJECTIVE: (objective measures completed at initial evaluation unless otherwise dated)   OBJECTIVE:    DIAGNOSTIC FINDINGS:  None noted   PATIENT SURVEYS:  FOTO patient unable to complete  and became frustrated at question phrasing even with PT assist   COGNITION:           Overall cognitive status: Within functional limits for tasks assessed                                  SENSATION: Not tested   POSTURE: Depressed R shoulder   UPPER EXTREMITY ROM:    Active ROM Right eval Left eval  Shoulder flexion 160d    Shoulder extension 30d    Shoulder abduction 160d    Shoulder adduction      Shoulder internal rotation WFL    Shoulder external rotation WFL    Elbow flexion WFL    Elbow extension WFL    Wrist flexion      Wrist extension      Wrist ulnar deviation      Wrist radial deviation      Wrist pronation      Wrist supination      (Blank rows = not tested)   UPPER EXTREMITY MMT:   MMT Right eval Left eval  Shoulder flexion 4+    Shoulder extension 4+  Shoulder abduction 4+    Shoulder adduction      Shoulder internal rotation 4+    Shoulder external rotation 4+    Middle trapezius      Lower trapezius      Elbow flexion      Elbow extension      Wrist flexion      Wrist extension      Wrist ulnar deviation      Wrist radial deviation      Wrist pronation      Wrist supination      Grip strength (lbs)      (Blank rows = not tested)   SHOULDER SPECIAL TESTS:            Impingement tests: Neer impingement test: negative, Hawkins/Kennedy impingement test: negative, and Painful arc test: negative            Rotator cuff assessment: Drop arm test: negative, Empty can test: negative, Full can test: negative, and Hornblower's  sign: negative   JOINT MOBILITY TESTING:  WFL   PALPATION:  TTP R AC joint             TODAY'S TREATMENT:  Eval and HEP     PATIENT EDUCATION: Education details: Discussed eval findings, rehab rationale and POC and patient is in agreement  Person educated: Patient Education method: Explanation, Demonstration, and Handouts Education comprehension: verbalized understanding and returned demonstration     HOME EXERCISE PROGRAM: Access Code: [email or web address] URL: [name] Date: 07/15/2021 Prepared by: Sharlynn Oliphant   Exercises - Standing Shoulder Scaption  - 2 x daily - 7 x weekly - 1 sets - 10 reps - Supine Shoulder Flexion Extension Full Range AROM  - 2 x daily - 7 x weekly - 1 sets - 10 reps - Supine Single Arm Shoulder Protraction  - 2 x daily - 7 x weekly - 1 sets - 10 reps   ASSESSMENT:   CLINICAL IMPRESSION: Patient is a 88 y.o. male  who was seen today for physical therapy evaluation and treatment for R shoulder pain.  Patient presents with equal B AROM, only mild discomfort at end ranges, no joint laxity, no impingement signs, RC appears intact.        OBJECTIVE IMPAIRMENTS decreased activity tolerance, decreased knowledge of condition, decreased strength, impaired UE functional use, postural dysfunction, and pain.    ACTIVITY LIMITATIONS carrying, lifting, and dressing   PERSONAL FACTORS Age, Fitness, and 1 comorbidity: DM  are also affecting patient's functional outcome.    REHAB POTENTIAL: Good   CLINICAL DECISION MAKING: Stable/uncomplicated   EVALUATION COMPLEXITY: Low     GOALS: Goals reviewed with patient? Yes   SHORT TERM GOALS: Target date: 08/06/21 Patient to demonstrate independence in HEP  Baseline: [id number] Goal status: INITIAL   2.  6/10 pain at worst Baseline: 10/10 pain at worst Goal status: INITIAL   LONG TERM GOALS: Target date: 08/26/2021     4/10 worst pain Baseline: 10/10 worst pain Goal status: INITIAL   2.   5/5 R shoulder strength Baseline:  MMT Right eval  Shoulder flexion 4+  Shoulder extension 4+  Shoulder abduction 4+  Shoulder adduction    Shoulder internal rotation 4+  Shoulder external rotation 4+    Goal status: INITIAL   3.  Correct depressed R shoulder posture due to pain Baseline: Depressed R shoulder posture Goal status: INITIAL       PLAN: PT FREQUENCY: 2x/week   PT DURATION: 4 weeks  PLANNED INTERVENTIONS: Therapeutic exercises, Therapeutic activity, Neuromuscular re-education, Balance training, Gait training, Patient/Family education, Joint mobilization, Dry Needling, Ionotophoresis '4mg'$ /ml Dexamethasone, Manual therapy, and Re-evaluation   PLAN FOR NEXT SESSION: HEP review, ionto to R AC joint, R shoulder ROM and strengthening.    Lanice Shirts, PT 07/29/2021, 1:44 PM

## 2021-08-05 ENCOUNTER — Encounter: Payer: Self-pay | Admitting: Student

## 2021-08-05 ENCOUNTER — Ambulatory Visit (INDEPENDENT_AMBULATORY_CARE_PROVIDER_SITE_OTHER): Payer: Medicare PPO | Admitting: Student

## 2021-08-05 VITALS — BP 129/60 | HR 88 | Temp 98.4°F | Wt 253.0 lb

## 2021-08-05 DIAGNOSIS — Z794 Long term (current) use of insulin: Secondary | ICD-10-CM | POA: Diagnosis not present

## 2021-08-05 DIAGNOSIS — I110 Hypertensive heart disease with heart failure: Secondary | ICD-10-CM | POA: Diagnosis not present

## 2021-08-05 DIAGNOSIS — I5042 Chronic combined systolic (congestive) and diastolic (congestive) heart failure: Secondary | ICD-10-CM

## 2021-08-05 DIAGNOSIS — J3089 Other allergic rhinitis: Secondary | ICD-10-CM

## 2021-08-05 DIAGNOSIS — I10 Essential (primary) hypertension: Secondary | ICD-10-CM

## 2021-08-05 DIAGNOSIS — E113293 Type 2 diabetes mellitus with mild 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, bilateral: Secondary | ICD-10-CM

## 2021-08-05 DIAGNOSIS — Z87891 Personal history of nicotine dependence: Secondary | ICD-10-CM

## 2021-08-05 MED ORDER — FUROSEMIDE 20 MG PO TABS: 20.0000 mg | ORAL_TABLET | Freq: Every day | ORAL | 3 refills | Status: DC

## 2021-08-05 MED ORDER — LEVEMIR FLEXPEN 100 UNIT/ML ~~LOC~~ SOPN: 20.0000 [IU] | PEN_INJECTOR | Freq: Every day | SUBCUTANEOUS | 11 refills | Status: DC

## 2021-08-05 MED ORDER — LORATADINE 10 MG PO TABS: ORAL_TABLET | ORAL | 3 refills | Status: DC

## 2021-08-05 MED ORDER — METOPROLOL TARTRATE 25 MG PO TABS: 50.0000 mg | ORAL_TABLET | Freq: Two times a day (BID) | ORAL | 1 refills | Status: DC

## 2021-08-05 MED ORDER — NOVOLOG FLEXPEN 100 UNIT/ML ~~LOC~~ SOPN: 12.0000 [IU] | PEN_INJECTOR | Freq: Two times a day (BID) | SUBCUTANEOUS | 11 refills | Status: DC

## 2021-08-05 MED ORDER — PEN NEEDLES 32G X 5 MM MISC: 1.0000 | Freq: Three times a day (TID) | 11 refills | Status: DC

## 2021-08-05 NOTE — Progress Notes (Signed)
CC: F/u Diabetes   HPI:  [name] is a 88 y.o. PMH noted below, presents to clinic for diabetes f/u. Please see problem-based list for further details, assessments and plans.     Past Medical History:  Diagnosis Date   Benign prostatic hyperplasia (BPH) with urinary urge incontinence 11/13/2017   Chronic left shoulder pain 11/13/2017   Chronic non-seasonal allergic rhinitis 02/22/1476   Chronic systolic heart failure ([place]) 10/31/2016   Echo (05/03/2015): LVEF 29-56%, grade 2 diastolic dysfunction   Coronary artery disease involving native coronary artery of native heart without angina pectoris 09/30/2007   s/p CABG on 01/05/2013: left internal mammary artery to left anterior descending, saphenous vein graft to obtuse marginal 1, sequential saphenous vein graft to acute marginal and posterior descending   Essential hypertension 11/04/2005   Gastroesophageal reflux disease with hiatal hernia 11/04/2005   History of prostate cancer 02/17/2006   Diagnosed 1995, s/p seed implantation in the late 1990's, PSA undetectable 04/10/2016   Hyperlipidemia 11/04/2005   Obesity (BMI 30.0-34.9) 11/13/2017   Peripheral arterial disease ([place]) 09/30/2007   with claudication, failed attempt at LLE PTCA   Type 2 diabetes mellitus with microalbuminuria, with long-term current use of insulin ([place]) 11/13/2017   Type 2 diabetes mellitus with mild 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, bilateral ([place]) 03/24/2018   Type 2 diabetes mellitus with peripheral neuropathy ([place]) 11/04/2005   Urethral stricture in male 11/13/2017   Noted on cystoscopy 12/18/2014.  Reportedly asked to do chronic intermittent self catheterizations, but has not.   Review of Systems:  negative chest pain, palpitations, SOB, dizziness   Physical Exam:  Vitals:   08/05/21 1041  BP: 129/60  Pulse: 88  Temp: 98.4 F (36.9 C)  TempSrc: Oral  SpO2: 99%  Weight: 253 lb (114.8 kg)   Physical  Exam Constitutional:      General: He is not in acute distress.    Appearance: Normal appearance.  Cardiovascular:     Rate and Rhythm: Normal rate. Rhythm irregular.  Pulmonary:     Effort: Pulmonary effort is normal.     Breath sounds: Normal breath sounds.  Neurological:     Mental Status: He is alert and oriented to person, place, and time.  Psychiatric:        Mood and Affect: Mood normal.        Behavior: Behavior normal.      Assessment & Plan:   Essential hypertension BP is under control today, 129/60. He is taking metoprolol, losartan and amlodipine with good results.   Plan: -continue triple therapy -consider combination pill to reduce pill burden at next visit  Type 2 diabetes mellitus with mild 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, bilateral ([place]) Patient presented for diabetes f/u. He is now taking Novolin N 20 units in the morning and Novolin R 12 units before lunch and dinner. His glucose levels continue to be elevated with average of 303. Last A1c was 8.0. Discussed with patient about changing to insulin pens. Denies any hypoglycemic episodes.   Plan: -change insulin regimen to the following:  (1) Levemir Flexpen 20 units in the morning  (2) Novolog Flexpen 12 units before lunch and dinner -referral to DSMT for use of new flexpens -f/u in 2 weeks    Chronic non-seasonal allergic rhinitis Resent the refill request. Well controlled with loratadine as needed.  Chronic combined systolic (congestive) and diastolic (congestive) heart failure ([place]) He is on Lasix with amlodipine, losartan and metoprolol. Well controlled. Denies SOB.  Plan: -resent refill for Lasix -continue current therapy   Patient seen with Dr. Heber Sartell

## 2021-08-05 NOTE — Patient Instructions (Addendum)
[name] [name],  Thank you for coming to the clinic today. Here is the summary for what we talked about:  Diabetes: We will start: Levemir Flexpen: take 20 units in the morning Novolog Flexpen: take12 units before lunch and supper Please check your blood sugar 4 times per day and please bring your glucose meter for your next visit.   2. Medication Refills: I have refilled your Metoprolol, Lasix and Loratadine.  Please return to clinic in 2 weeks.   Take care,  Dr. Markus Jarvis

## 2021-08-05 NOTE — Assessment & Plan Note (Addendum)
Resent the refill request. Well controlled with loratadine as needed.

## 2021-08-05 NOTE — Assessment & Plan Note (Addendum)
He is on Lasix with amlodipine, losartan and metoprolol. Well controlled. Denies SOB.   Plan: -resent refill for Lasix -continue current therapy

## 2021-08-05 NOTE — Assessment & Plan Note (Addendum)
BP is under control today, 129/60. He is taking metoprolol, losartan and amlodipine with good results.   Plan: -continue triple therapy -consider combination pill to reduce pill burden at next visit

## 2021-08-05 NOTE — Assessment & Plan Note (Addendum)
Patient presented for diabetes f/u. He is now taking Novolin N 20 units in the morning and Novolin R 12 units before lunch and dinner. His glucose levels continue to be elevated with average of 303. Last A1c was 8.0. Discussed with patient about changing to insulin pens. Denies any hypoglycemic episodes.   Plan: -change insulin regimen to the following:  (1) Levemir Flexpen 20 units in the morning  (2) Novolog Flexpen 12 units before lunch and dinner -referral to DSMT for use of new flexpens -f/u in 2 weeks

## 2021-08-08 NOTE — Progress Notes (Signed)
Internal Medicine Clinic Attending  I saw and evaluated the patient.  I personally confirmed the key portions of the history and exam documented by the resident  and I reviewed pertinent patient test results.  The assessment, diagnosis, and plan were formulated together and I agree with the documentation in the resident's note.  [name] [name] diabetes is poorly controlled unfortunately reports he is not following up with [name] [name] any longer.  On review of his records it appears he has been on Novolin 70/30 for quite some time without much medication changes.  He is experiencing both hyperand hypoglycemia and I do not believe this regimen is working well for him anymore.  I have tried to use his medication formulary to see if there will be other options I do think he would benefit more from a long acting insulin once a day and a short acting insulin at least with his largest meal of the day if not for 2 meals.  Given that the change to pens is new for him I think this in the long run will be easier but I have asked him to follow-up with [name] [name] for additional teaching he remain remain on his Novolin 70/30 until he has additional education.

## 2021-08-28 ENCOUNTER — Ambulatory Visit (INDEPENDENT_AMBULATORY_CARE_PROVIDER_SITE_OTHER): Payer: Medicare PPO | Admitting: Student

## 2021-08-28 ENCOUNTER — Encounter: Payer: Self-pay | Admitting: Dietician

## 2021-08-28 ENCOUNTER — Ambulatory Visit: Payer: Medicare PPO | Admitting: Dietician

## 2021-08-28 ENCOUNTER — Encounter: Payer: Self-pay | Admitting: Student

## 2021-08-28 ENCOUNTER — Other Ambulatory Visit: Payer: Self-pay

## 2021-08-28 DIAGNOSIS — E113293 Type 2 diabetes mellitus with mild 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, bilateral: Secondary | ICD-10-CM

## 2021-08-28 DIAGNOSIS — I10 Essential (primary) hypertension: Secondary | ICD-10-CM

## 2021-08-28 DIAGNOSIS — Z87891 Personal history of nicotine dependence: Secondary | ICD-10-CM

## 2021-08-28 DIAGNOSIS — Z794 Long term (current) use of insulin: Secondary | ICD-10-CM

## 2021-08-28 NOTE — Assessment & Plan Note (Addendum)
Vitals:   08/28/21 1434 08/28/21 1445  BP: (!) 145/63 137/60  Pulse: (!) 107 (!) 59  Resp: 20   Temp: 98.1 F (36.7 C)   SpO2: 99% 99%   Repeat BP within range today. He is on metoprolol 50 mg BID, losartan 50 mg and amlodipine 10 mg. Well-controlled. Had a headache this morning when taking his AM meds. Does not have any headache at the visit. Was unable to discuss combining medications to reduce pill burden. Consider addressing it at next visit.   Plan -continue metoprolol, losartan, and amlodipine

## 2021-08-28 NOTE — Progress Notes (Signed)
CC: Diabetes and HTN  HPI:  [name] is a 88 y.o. male living with a history stated below and presents today for diabetes and HTN. Please see problem based assessment and plan for additional details.  Past Medical History:  Diagnosis Date   Benign prostatic hyperplasia (BPH) with urinary urge incontinence 11/13/2017   Chronic left shoulder pain 11/13/2017   Chronic non-seasonal allergic rhinitis 10/19/3844   Chronic systolic heart failure ([place]) 10/31/2016   Echo (05/03/2015): LVEF 65-99%, grade 2 diastolic dysfunction   Coronary artery disease involving native coronary artery of native heart without angina pectoris 09/30/2007   s/p CABG on 01/05/2013: left internal mammary artery to left anterior descending, saphenous vein graft to obtuse marginal 1, sequential saphenous vein graft to acute marginal and posterior descending   Essential hypertension 11/04/2005   Gastroesophageal reflux disease with hiatal hernia 11/04/2005   History of prostate cancer 02/17/2006   Diagnosed 1995, s/p seed implantation in the late 1990's, PSA undetectable 04/10/2016   Hyperlipidemia 11/04/2005   Obesity (BMI 30.0-34.9) 11/13/2017   Peripheral arterial disease ([place]) 09/30/2007   with claudication, failed attempt at LLE PTCA   Type 2 diabetes mellitus with microalbuminuria, with long-term current use of insulin ([place]) 11/13/2017   Type 2 diabetes mellitus with mild 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, bilateral ([place]) 03/24/2018   Type 2 diabetes mellitus with peripheral neuropathy ([place]) 11/04/2005   Urethral stricture in male 11/13/2017   Noted on cystoscopy 12/18/2014.  Reportedly asked to do chronic intermittent self catheterizations, but has not.    Current Outpatient Medications on File Prior to Visit  Medication Sig Dispense Refill   Accu-Chek Softclix Lancets lancets Use to check blood sugar twice a day 100 each 0   allopurinol (ZYLOPRIM) 100 MG tablet Take 2 tablets (200  mg total) by mouth daily. 180 tablet 3   amLODipine (NORVASC) 10 MG tablet Take 1 tablet (10 mg total) by mouth daily. 90 tablet 3   aspirin EC 81 MG tablet Take 1 tablet (81 mg total) by mouth daily.     atorvastatin (LIPITOR) 40 MG tablet Take 1 tablet (40 mg total) by mouth daily. 90 tablet 3   Blood Glucose Monitoring Suppl (ACCU-CHEK GUIDE ME) w/Device KIT Use to check blood sugar twice a day 1 kit 0   colchicine 0.6 MG tablet Take 1 tablet by mouth twice daily 60 tablet 0   diclofenac Sodium (VOLTAREN) 1 % GEL Apply 2 g topically 4 (four) times daily.     furosemide (LASIX) 20 MG tablet Take 1 tablet (20 mg total) by mouth daily. 90 tablet 3   gabapentin (NEURONTIN) 100 MG capsule Take 2 capsules (200 mg total) by mouth 3 (three) times daily. 180 capsule 3   glucose blood (ACCU-CHEK GUIDE) test strip USE TO CHECK BLOOD SUGAR THREE TIMES A DAY 100 each 3   insulin aspart (NOVOLOG FLEXPEN) 100 UNIT/ML FlexPen Inject 12 Units into the skin 2 (two) times daily with a meal. 15 mL 11   insulin detemir (LEVEMIR FLEXPEN) 100 UNIT/ML FlexPen Inject 20 Units into the skin daily. 15 mL 11   insulin NPH Human (NOVOLIN N) 100 UNIT/ML injection Inject 0.2 mLs (20 Units total) into the skin daily before breakfast. Per Dr [name] OV note of 11/17/19 10 mL 3   Insulin Pen Needle (PEN NEEDLES) 32G X 5 MM MISC 1 each by Does not apply route in the morning, at noon, and at bedtime. 100 each 11   insulin  regular (NOVOLIN R RELION) 100 units/mL injection Inject 0.12 mLs (12 Units total) into the skin 2 (two) times daily before a meal. Before breakfast and supper 7.5 mL 11   Insulin Syringe-Needle U-100 31G X 15/64" 0.3 ML MISC Use to inject insulin 3 times a day 300 each 3   lactulose (CHRONULAC) 10 GM/15ML solution Take 15 mLs (10 g total) by mouth daily. 946 mL 2   loratadine (EQ LORATADINE) 10 MG tablet TAKE 1 TABLET BY MOUTH DAILY AS NEEDED FOR RHINITIS. Strength: 10 mg 90 tablet 3   losartan (COZAAR) 50 MG  tablet Take 1 tablet (50 mg total) by mouth daily. 90 tablet 3   meloxicam (MOBIC) 15 MG tablet Take 1 tablet (15 mg total) by mouth daily. 7 tablet 0   metoprolol tartrate (LOPRESSOR) 25 MG tablet Take 2 tablets (50 mg total) by mouth 2 (two) times daily. 180 tablet 1   pantoprazole (PROTONIX) 40 MG tablet TAKE 1 TABLET BY MOUTH TWICE DAILY 30  MINUTES  BEFORE  MEALS 180 tablet 0   senna-docusate (SENOKOT S) 8.6-50 MG tablet Take 2 tablets by mouth 2 (two) times daily. 90 tablet 0   tamsulosin (FLOMAX) 0.4 MG CAPS capsule Take 1 capsule (0.4 mg total) by mouth daily. 90 capsule 3   trospium (SANCTURA) 20 MG tablet Take 20 mg by mouth in the morning and at bedtime.     [DISCONTINUED] insulin glargine-yfgn (SEMGLEE) 100 UNIT/ML Pen Inject 20 Units into the skin 2 (two) times daily. Can you message me [name]) with cost after running this. Patient has issues with affordability. Not sure if this is preferred on plan. (Patient taking differently: Inject 20 Units into the skin 2 (two) times daily.) 15 mL 1   No current facility-administered medications on file prior to visit.   Review of Systems: ROS negative except for what is noted on the assessment and plan.  Vitals:   08/28/21 1431 08/28/21 1434 08/28/21 1445  BP:  (!) 145/63 137/60  Pulse:  (!) 107 (!) 59  Resp:  20   Temp:  98.1 F (36.7 C)   TempSrc:  Oral   SpO2:  99% 99%  Weight: 256 lb 14.4 oz (116.5 kg) 256 lb 14.4 oz (116.5 kg)   Height: $Remove'6\' 1"'uoGvcZT$  (1.854 m) $RemoveB'6\' 1"'PklqbrpC$  (1.854 m)    Physical Exam: Constitutional: well-appearing, in no acute distress HENT: normocephalic atraumatic Neck: supple Cardiovascular: regular rate and rhythm Neurological: alert & oriented x 3 Skin: warm and dry Psych: normal mood and behavior  Assessment & Plan:   Essential hypertension Vitals:   08/28/21 1434 08/28/21 1445  BP: (!) 145/63 137/60  Pulse: (!) 107 (!) 59  Resp: 20   Temp: 98.1 F (36.7 C)   SpO2: 99% 99%   Repeat BP within  range today. He is on metoprolol 50 mg BID, losartan 50 mg and amlodipine 10 mg. Well-controlled. Had a headache this morning when taking his AM meds. Does not have any headache at the visit. Was unable to discuss combining medications to reduce pill burden. Consider addressing it at next visit.   Plan -continue metoprolol, losartan, and amlodipine  Type 2 diabetes mellitus with mild 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, bilateral ([place]) Patient here for diabetes f/u with A1c 8.0%. He continues to take NPH 20 units in morning and Novolin R 12 units with dinner. Checks his glucose at least twice a day. Fasting glucose around 160-180s, afternoon glucose 200-300s. Denies hypoglycemic symptoms. Patient asked  about going back to Dr. Dwyane Dee. We can send referral if he still prefers to at follow up.   Plan -continue NPH 20 units in morning -take Novolin R 12 units with lunch and dinner -waiting on Flexpens to be delivered  -will f/u next week with [place] and [name] to review Flexpen use and possible professional CGM   Patient seen with Dr. Janeece Riggers, D.O. [facility], PGY-1 Phone: [phone] Date 08/28/2021 Time 4:23 PM

## 2021-08-28 NOTE — Patient Instructions (Addendum)
Thank you, [name] for allowing [place] to provide your care today. Today we discussed diabetes and blood pressure.    Diabetes: -Continue Novolin N (Cloudy) 20 units in the morning. -Continue Novolin R (Clear) 12 units at [facility] and [place]. -Keep checking your blood sugar daily. -Call the clinic when you get the new insulin pens. -Return to clinic in 1 week for follow-up and see [name].   Blood Pressure: -Continue your Metoprolol, Losartan and Amlodipine  I have ordered the following medication/changed the following medications:   Stop the following medications: There are no discontinued medications.   Start the following medications: No orders of the defined types were placed in this encounter.    Follow up:  1 week    Remember: Please remember to bring your medications and your glucose meter at your next visit.   Should you have any questions or concerns please call the internal medicine clinic at [phone].    Angelique Blonder, D.O. [place]

## 2021-08-28 NOTE — Assessment & Plan Note (Addendum)
Patient here for diabetes f/u with A1c 8.0%. He continues to take NPH 20 units in morning and Novolin R 12 units with dinner. Checks his glucose at least twice a day. Fasting glucose around 160-180s, afternoon glucose 200-300s. Denies hypoglycemic symptoms. Patient asked about going back to Dr. Dwyane Dee. We can send referral if he still prefers to at follow up.   Plan -continue NPH 20 units in morning -take Novolin R 12 units with lunch and dinner -waiting on Flexpens to be delivered  -will f/u next week with [place] and [name] to review Flexpen use and possible professional CGM

## 2021-08-28 NOTE — Progress Notes (Signed)
Met with Dr. Markus Jarvis and patient today per referral from Dr. Markus Jarvis. [name] [name] is being referred for insulin pen instruction, but he does not have his pens yet and does not want to learn using practice pens. He wants to wait until he gets the insulin pens through the mail.   He is not ready to wear a professional Continuous glucose monitor or  learn more about personal Continuous glucose monitoring. He because confused and thought the Continuous glucose monitor would give him his insulin. At that point, Dr. Markus Jarvis suggested he remain on his current insulins right now. Will follow up with Mr Kho when he returns to the office with his insulin pens or desires/agrees to Continuous glucose monitoring.  [name], RD 08/28/2021 3:51 PM.

## 2021-08-29 ENCOUNTER — Encounter: Payer: Medicare PPO | Admitting: Student

## 2021-08-29 ENCOUNTER — Encounter: Payer: Medicare PPO | Admitting: Dietician

## 2021-09-02 NOTE — Progress Notes (Signed)
Internal Medicine Clinic Attending  Case discussed with Dr. Zheng  at the time of the visit.  We reviewed the resident's history and exam and pertinent patient test results.  I agree with the assessment, diagnosis, and plan of care documented in the resident's note.  

## 2021-09-13 ENCOUNTER — Inpatient Hospital Stay (HOSPITAL_COMMUNITY)
Admission: EM | Admit: 2021-09-13 | Discharge: 2021-09-15 | DRG: 689 | Disposition: A | Discharge status: Home / Self Care | Payer: Medicare PPO | Attending: Internal Medicine | Admitting: Internal Medicine

## 2021-09-13 ENCOUNTER — Ambulatory Visit (INDEPENDENT_AMBULATORY_CARE_PROVIDER_SITE_OTHER): Payer: Medicare PPO

## 2021-09-13 ENCOUNTER — Telehealth: Payer: Self-pay | Admitting: *Deleted

## 2021-09-13 ENCOUNTER — Encounter (HOSPITAL_COMMUNITY): Payer: Self-pay | Admitting: *Deleted

## 2021-09-13 ENCOUNTER — Encounter (HOSPITAL_COMMUNITY): Payer: Self-pay | Admitting: Emergency Medicine

## 2021-09-13 ENCOUNTER — Ambulatory Visit (HOSPITAL_COMMUNITY)
Admission: EM | Admit: 2021-09-13 | Discharge: 2021-09-13 | Disposition: A | Discharge status: Home / Self Care | Payer: Medicare PPO | Attending: Urgent Care | Admitting: Urgent Care

## 2021-09-13 ENCOUNTER — Other Ambulatory Visit: Payer: Self-pay

## 2021-09-13 DIAGNOSIS — N35819 Other urethral stricture, male, unspecified site: Secondary | ICD-10-CM | POA: Diagnosis present

## 2021-09-13 DIAGNOSIS — R7309 Other abnormal glucose: Secondary | ICD-10-CM | POA: Diagnosis not present

## 2021-09-13 DIAGNOSIS — Z794 Long term (current) use of insulin: Secondary | ICD-10-CM | POA: Diagnosis not present

## 2021-09-13 DIAGNOSIS — Z955 Presence of coronary angioplasty implant and graft: Secondary | ICD-10-CM | POA: Diagnosis not present

## 2021-09-13 DIAGNOSIS — I2510 Atherosclerotic heart disease of native coronary artery without angina pectoris: Secondary | ICD-10-CM | POA: Diagnosis present

## 2021-09-13 DIAGNOSIS — I10 Essential (primary) hypertension: Secondary | ICD-10-CM | POA: Diagnosis not present

## 2021-09-13 DIAGNOSIS — E1151 Type 2 diabetes mellitus with diabetic peripheral angiopathy without gangrene: Secondary | ICD-10-CM | POA: Diagnosis present

## 2021-09-13 DIAGNOSIS — R739 Hyperglycemia, unspecified: Secondary | ICD-10-CM

## 2021-09-13 DIAGNOSIS — E1165 Type 2 diabetes mellitus with hyperglycemia: Secondary | ICD-10-CM

## 2021-09-13 DIAGNOSIS — B9689 Other specified bacterial agents as the cause of diseases classified elsewhere: Secondary | ICD-10-CM | POA: Diagnosis not present

## 2021-09-13 DIAGNOSIS — Z791 Long term (current) use of non-steroidal anti-inflammatories (NSAID): Secondary | ICD-10-CM | POA: Diagnosis not present

## 2021-09-13 DIAGNOSIS — Z8249 Family history of ischemic heart disease and other diseases of the circulatory system: Secondary | ICD-10-CM | POA: Diagnosis not present

## 2021-09-13 DIAGNOSIS — I5042 Chronic combined systolic (congestive) and diastolic (congestive) heart failure: Secondary | ICD-10-CM | POA: Diagnosis present

## 2021-09-13 DIAGNOSIS — Z7982 Long term (current) use of aspirin: Secondary | ICD-10-CM

## 2021-09-13 DIAGNOSIS — E113293 Type 2 diabetes mellitus with mild 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, bilateral: Secondary | ICD-10-CM

## 2021-09-13 DIAGNOSIS — R509 Fever, unspecified: Secondary | ICD-10-CM

## 2021-09-13 DIAGNOSIS — N35919 Unspecified urethral stricture, male, unspecified site: Secondary | ICD-10-CM | POA: Diagnosis not present

## 2021-09-13 DIAGNOSIS — G9340 Encephalopathy, unspecified: Secondary | ICD-10-CM | POA: Diagnosis not present

## 2021-09-13 DIAGNOSIS — Z951 Presence of aortocoronary bypass graft: Secondary | ICD-10-CM

## 2021-09-13 DIAGNOSIS — N401 Enlarged prostate with lower urinary tract symptoms: Secondary | ICD-10-CM | POA: Diagnosis present

## 2021-09-13 DIAGNOSIS — Z87891 Personal history of nicotine dependence: Secondary | ICD-10-CM

## 2021-09-13 DIAGNOSIS — R410 Disorientation, unspecified: Secondary | ICD-10-CM | POA: Diagnosis not present

## 2021-09-13 DIAGNOSIS — G9341 Metabolic encephalopathy: Secondary | ICD-10-CM | POA: Diagnosis present

## 2021-09-13 DIAGNOSIS — F05 Delirium due to known physiological condition: Secondary | ICD-10-CM

## 2021-09-13 DIAGNOSIS — G9349 Other encephalopathy: Secondary | ICD-10-CM | POA: Diagnosis not present

## 2021-09-13 DIAGNOSIS — E785 Hyperlipidemia, unspecified: Secondary | ICD-10-CM | POA: Diagnosis present

## 2021-09-13 DIAGNOSIS — I739 Peripheral vascular disease, unspecified: Secondary | ICD-10-CM | POA: Diagnosis not present

## 2021-09-13 DIAGNOSIS — N390 Urinary tract infection, site not specified: Secondary | ICD-10-CM | POA: Diagnosis not present

## 2021-09-13 DIAGNOSIS — N3001 Acute cystitis with hematuria: Principal | ICD-10-CM

## 2021-09-13 DIAGNOSIS — E871 Hypo-osmolality and hyponatremia: Secondary | ICD-10-CM | POA: Diagnosis present

## 2021-09-13 DIAGNOSIS — I110 Hypertensive heart disease with heart failure: Secondary | ICD-10-CM | POA: Diagnosis present

## 2021-09-13 DIAGNOSIS — Z79899 Other long term (current) drug therapy: Secondary | ICD-10-CM

## 2021-09-13 DIAGNOSIS — R109 Unspecified abdominal pain: Secondary | ICD-10-CM | POA: Diagnosis not present

## 2021-09-13 LAB — URINALYSIS, ROUTINE W REFLEX MICROSCOPIC
Bilirubin Urine: NEGATIVE
Glucose, UA: 50 mg/dL — AB
Ketones, ur: NEGATIVE mg/dL
Nitrite: NEGATIVE
Protein, ur: >=300 mg/dL — AB
Specific Gravity, Urine: 1.024 (ref 1.005–1.030)
WBC, UA: >50 WBC/hpf — ABNORMAL HIGH (ref 0–5)
pH: 5 (ref 5.0–8.0)

## 2021-09-13 LAB — CBC
HCT: 40.8 % (ref 39.0–52.0)
Hemoglobin: 13.8 g/dL (ref 13.0–17.0)
MCH: 30.9 pg (ref 26.0–34.0)
MCHC: 33.8 g/dL (ref 30.0–36.0)
MCV: 91.5 fL (ref 80.0–100.0)
Platelets: 206 10*3/uL (ref 150–400)
RBC: 4.46 MIL/uL (ref 4.22–5.81)
RDW: 12.9 % (ref 11.5–15.5)
WBC: 24.6 10*3/uL — ABNORMAL HIGH (ref 4.0–10.5)
nRBC: 0 % (ref 0.0–0.2)

## 2021-09-13 LAB — BASIC METABOLIC PANEL
Anion gap: 10 (ref 5–15)
BUN: 21 mg/dL (ref 8–23)
CO2: 23 mmol/L (ref 22–32)
Calcium: 8.7 mg/dL — ABNORMAL LOW (ref 8.9–10.3)
Chloride: 100 mmol/L (ref 98–111)
Creatinine, Ser: 1.42 mg/dL — ABNORMAL HIGH (ref 0.61–1.24)
GFR, Estimated: 48 mL/min — ABNORMAL LOW (ref >60)
Glucose, Bld: 341 mg/dL — ABNORMAL HIGH (ref 70–99)
Potassium: 4 mmol/L (ref 3.5–5.1)
Sodium: 133 mmol/L — ABNORMAL LOW (ref 135–145)

## 2021-09-13 LAB — CBG MONITORING, ED
Glucose-Capillary: 343 mg/dL — ABNORMAL HIGH (ref 70–99)
Glucose-Capillary: 364 mg/dL — ABNORMAL HIGH (ref 70–99)

## 2021-09-13 MED ORDER — ACETAMINOPHEN 325 MG PO TABS
650.0000 mg | ORAL_TABLET | Freq: Once | ORAL | Status: AC
Start: 2021-09-13 — End: 2021-09-13
  Administered 2021-09-13: 650 mg via ORAL

## 2021-09-13 MED ORDER — ACETAMINOPHEN 325 MG PO TABS
ORAL_TABLET | ORAL | Status: AC
  Filled 2021-09-13: qty 2

## 2021-09-13 NOTE — ED Triage Notes (Signed)
The pt was acting different this am  he appeared to be confused whenever he ate his breakfast and never checked his blood sugar as he usually does.  He was sent down here from [facility] for treatment.  At present alert and oriented x 4  c/o some rt chest pain

## 2021-09-13 NOTE — Discharge Instructions (Signed)
Your glucose in 343, in part due to not getting your insulin administered here today. You have a fever of 102 with a chest xray concerning for developing infection. Please head to the ER for a further workup.

## 2021-09-13 NOTE — Telephone Encounter (Signed)
Agree with plan 

## 2021-09-13 NOTE — ED Provider Triage Note (Signed)
Emergency Medicine Provider Triage Evaluation Note  [name] [name] , a 88 y.o. male  was evaluated in triage.  Pt was seen in urgent care earlier today, brought in by his wife complaining of altered mental status.  Per wife, he lives alone at home with his wife.  This morning, the patient was acting confused and cannot figure out how to administer his insulin.  Patient did not eat his breakfast, his daughter came over at 2 PM and found him asleep in his room with his medications out and no food eaten.  On exam, the patient is alert and oriented x4.  Patient states he is having occasional burning with urination however denies any cough, chest pain or shortness of breath.  The patient denies any nausea, vomiting or abdominal pain.  The patient apparently had concerning chest x-ray done in urgent care which was reason patient was sent here for further evaluation.   Review of Systems  Positive:  Negative:   Physical Exam  BP 135/60   Pulse 61   Temp 98.5 F (36.9 C) (Oral)   Resp 20   Ht '6\' 1"'$  (1.854 m)   Wt 116.5 kg   SpO2 96%   BMI 33.89 kg/m  Gen:   Awake, no distress   Resp:  Normal effort  MSK:   Moves extremities without difficulty  Other:    Medical Decision Making  Medically screening exam initiated at 7:06 PM.  Appropriate orders placed.  Brailen Welton was informed that the remainder of the evaluation will be completed by another provider, this initial triage assessment does not replace that evaluation, and the importance of remaining in the ED until their evaluation is complete.     Azucena Cecil, PA-C 09/13/21 1908

## 2021-09-13 NOTE — ED Triage Notes (Signed)
Patient was not acting his usual baseline this morning.  Patient is described as lethargic.  Patient seemed confused as to how to administer insulin.  Patient has not had any insulin today.  Did take a bite of bread and orange juice.  Patient is described as being more quiet as usual.    Told a family member his head is swimmy.

## 2021-09-13 NOTE — ED Notes (Signed)
Whitney, pa at bedside for cbg reading

## 2021-09-13 NOTE — Telephone Encounter (Signed)
Call from pt's daughter, Molinda Bailiff. Stated is not eating, unsure how to give himself his insulin. Stated pt's wife said pt had been poking himself in the stomach (this morning); I asked with a needle, she stated yes. Unsure if pt had given himself insulin or not. She's not sure what pt's BS is; wife does not know. Stated she was abe to get him to eat a little bit since she has been at the home this afternoon. Stated she does not know how to give him his insulin nor how much. I informed the daughter pt needs to be evaluated to see why he's so confused today, and to check his BS's and to make sure nothing else is going on with him. Our office is currently closed .She agreed - stated she will bring him to ER or UC.

## 2021-09-13 NOTE — ED Notes (Signed)
Notified whitney, pa of patient presentation

## 2021-09-13 NOTE — ED Provider Notes (Signed)
[place]    CSN: [id number] Arrival date & time: 09/13/21  1536      History   Chief Complaint No chief complaint on file.   HPI [name] [name] is a 88 y.o. male.   88 year old male presents today due to concerns of confusion and lethargy.  Patient lives with his wife at home.  Apparently this morning, patient was acting confused and could not figure out how to administer his insulin.  He did not eat breakfast, and when his daughter came over at 2:00 this afternoon, found him asleep in his room with his medications out and no food eaten.  Patient states he does not feel bad, or confused, and is uncertain why he could not figure out his insulin. He does have a mild non-productive cough. He states that his R side is hurting. He is unable to answer most questions in office.     Past Medical History:  Diagnosis Date   Benign prostatic hyperplasia (BPH) with urinary urge incontinence 11/13/2017   Chronic left shoulder pain 11/13/2017   Chronic non-seasonal allergic rhinitis 2/99/3716   Chronic systolic heart failure ([place]) 10/31/2016   Echo (05/03/2015): LVEF 96-78%, grade 2 diastolic dysfunction   Coronary artery disease involving native coronary artery of native heart without angina pectoris 09/30/2007   s/p CABG on 01/05/2013: left internal mammary artery to left anterior descending, saphenous vein graft to obtuse marginal 1, sequential saphenous vein graft to acute marginal and posterior descending   Essential hypertension 11/04/2005   Gastroesophageal reflux disease with hiatal hernia 11/04/2005   History of prostate cancer 02/17/2006   Diagnosed 1995, s/p seed implantation in the late 1990's, PSA undetectable 04/10/2016   Hyperlipidemia 11/04/2005   Obesity (BMI 30.0-34.9) 11/13/2017   Peripheral arterial disease ([place]) 09/30/2007   with claudication, failed attempt at LLE PTCA   Type 2 diabetes mellitus with microalbuminuria, with long-term current use of insulin ([place])  11/13/2017   Type 2 diabetes mellitus with mild 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, bilateral ([place]) 03/24/2018   Type 2 diabetes mellitus with peripheral neuropathy ([place]) 11/04/2005   Urethral stricture in male 11/13/2017   Noted on cystoscopy 12/18/2014.  Reportedly asked to do chronic intermittent self catheterizations, but has not.    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) 12/19/2020   Mild cognitive impairment 06/25/2020   Esophageal dysphagia 04/24/2020   Chronic gout 05/27/2018   Constipation, unspecified 03/26/2018   Type 2 diabetes mellitus with mild 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, bilateral ([place]) 03/24/2018   Type 2 diabetes mellitus with microalbuminuria, with long-term current use of insulin ([place]) 11/13/2017   Healthcare maintenance 11/13/2017   Chronic left shoulder pain 11/13/2017   Severe obesity (BMI 35.0-35.9 with comorbidity) ([place]) 11/13/2017   Urethral stricture in male 11/13/2017   Benign prostatic hyperplasia (BPH) with urinary urge incontinence 11/13/2017   Chronic combined systolic (congestive) and diastolic (congestive) heart failure ([place]) 10/31/2016   Arthritis 12/19/2014   Coronary artery disease involving native coronary artery of native heart without angina pectoris 09/30/2007   Peripheral arterial disease ([place]) 09/30/2007   Chronic non-seasonal allergic rhinitis 02/17/2006   History of prostate cancer 02/17/2006   Type 2 diabetes mellitus with peripheral neuropathy ([place]) 11/04/2005   Hyperlipidemia 11/04/2005   Essential hypertension 11/04/2005   Gastroesophageal reflux disease with hiatal hernia  11/04/2005    Past Surgical History:  Procedure Laterality Date   CARDIAC CATHETERIZATION  10/05/08   REVEALS HYPOKINESIS OF THE LATERAL WALL AND EF  50-55%   CORONARY ANGIOPLASTY WITH STENT PLACEMENT     CORONARY ARTERY BYPASS GRAFT N/A 01/05/2013   Procedure: CORONARY ARTERY BYPASS GRAFTING (CABG);  Surgeon: Melrose Nakayama, MD;  Location: [place];  Service: Open Heart Surgery;  Laterality: N/A;   INTRAOPERATIVE TRANSESOPHAGEAL ECHOCARDIOGRAM N/A 01/05/2013   Procedure: INTRAOPERATIVE TRANSESOPHAGEAL ECHOCARDIOGRAM;  Surgeon: Melrose Nakayama, MD;  Location: [place];  Service: Open Heart Surgery;  Laterality: N/A;   LEFT HEART CATHETERIZATION WITH CORONARY ANGIOGRAM N/A 01/03/2013   Procedure: LEFT HEART CATHETERIZATION WITH CORONARY ANGIOGRAM;  Surgeon: Lorretta Harp, MD;  Location: [facility] C[facility];  Service: Cardiovascular;  Laterality: N/A;   LOWER EXTREMITY ANGIOGRAM Right 12/07/2012   unsuccessful attempt at percutaneous revascularization of a calcified long segment chronic total occlusion mid left SFA /notes 12/07/2012   LOWER EXTREMITY ANGIOGRAM N/A 12/07/2012   Procedure: LOWER EXTREMITY ANGIOGRAM;  Surgeon: Lorretta Harp, MD;  Location: [facility] CATH LAB;  Service: Cardiovascular;  Laterality: N/A;   PROSTATE SURGERY  1990's       Home Medications    Prior to Admission medications   Medication Sig Start Date End Date Taking? Authorizing Provider  Accu-Chek Softclix Lancets lancets Use to check blood sugar twice a day 03/13/21   Elayne Snare, MD  allopurinol (ZYLOPRIM) 100 MG tablet Take 2 tablets (200 mg total) by mouth daily. 11/01/20   Iona Beard, MD  amLODipine (NORVASC) 10 MG tablet Take 1 tablet (10 mg total) by mouth daily. 01/29/21   Nahser, Wonda Cheng, MD  aspirin EC 81 MG tablet Take 1 tablet (81 mg total) by mouth daily. 10/31/16   Nahser, Wonda Cheng, MD  atorvastatin (LIPITOR) 40 MG tablet Take 1 tablet (40 mg total) by mouth daily. 09/14/20   [name], MD  Blood Glucose Monitoring Suppl (ACCU-CHEK GUIDE ME) w/Device KIT Use to check blood sugar twice a day 03/13/21   Elayne Snare, MD  colchicine 0.6 MG tablet Take 1  tablet by mouth twice daily 05/24/21   [name], MD  diclofenac Sodium (VOLTAREN) 1 % GEL Apply 2 g topically 4 (four) times daily. 07/23/21   [name], MD  furosemide (LASIX) 20 MG tablet Take 1 tablet (20 mg total) by mouth daily. 08/05/21   Angelique Blonder, DO  gabapentin (NEURONTIN) 100 MG capsule Take 2 capsules (200 mg total) by mouth 3 (three) times daily. 06/27/21   Timothy Lasso, MD  glucose blood (ACCU-CHEK GUIDE) test strip USE TO CHECK BLOOD SUGAR THREE TIMES A DAY 07/23/21   [name], MD  insulin aspart (NOVOLOG FLEXPEN) 100 UNIT/ML FlexPen Inject 12 Units into the skin 2 (two) times daily with a meal. 08/05/21   Angelique Blonder, DO  insulin detemir (LEVEMIR FLEXPEN) 100 UNIT/ML FlexPen Inject 20 Units into the skin daily. 08/05/21   Angelique Blonder, DO  insulin NPH Human (NOVOLIN N) 100 UNIT/ML injection Inject 0.2 mLs (20 Units total) into the skin daily before breakfast. Per Dr [name] OV note of 11/17/19 09/14/20   [name], MD  Insulin Pen Needle (PEN NEEDLES) 32G X 5 MM MISC 1 each by Does not apply route in the morning, at noon, and at bedtime. 08/05/21   Angelique Blonder, DO  insulin regular (NOVOLIN R RELION) 100 units/mL injection Inject 0.12 mLs (12 Units total) into the skin 2 (  two) times daily before a meal. Before breakfast and supper 06/21/21   [name], MD  Insulin Syringe-Needle U-100 31G X 15/64" 0.3 ML MISC Use to inject insulin 3 times a day 08/25/19   [name], MD  lactulose (CHRONULAC) 10 GM/15ML solution Take 15 mLs (10 g total) by mouth daily. 01/11/21   [name], MD  loratadine (EQ LORATADINE) 10 MG tablet TAKE 1 TABLET BY MOUTH DAILY AS NEEDED FOR RHINITIS. Strength: 10 mg 08/05/21   Angelique Blonder, DO  losartan (COZAAR) 50 MG tablet Take 1 tablet (50 mg total) by mouth daily. 10/18/20   [name], MD  meloxicam (MOBIC) 15 MG tablet Take 1 tablet (15 mg total) by mouth daily. 06/24/21   Vanessa Kick, MD  metoprolol tartrate (LOPRESSOR)  25 MG tablet Take 2 tablets (50 mg total) by mouth 2 (two) times daily. 08/05/21   Angelique Blonder, DO  pantoprazole (PROTONIX) 40 MG tablet TAKE 1 TABLET BY MOUTH TWICE DAILY 30  MINUTES  BEFORE  MEALS 06/27/21   [name], MD  senna-docusate (SENOKOT S) 8.6-50 MG tablet Take 2 tablets by mouth 2 (two) times daily. 11/01/20   Iona Beard, MD  tamsulosin (FLOMAX) 0.4 MG CAPS capsule Take 1 capsule (0.4 mg total) by mouth daily. 09/14/20   [name], MD  trospium (SANCTURA) 20 MG tablet Take 20 mg by mouth in the morning and at bedtime. 05/24/21   [provider]  insulin glargine-yfgn (SEMGLEE) 100 UNIT/ML Pen Inject 20 Units into the skin 2 (two) times daily. Can you message me [name]) with cost after running this. Patient has issues with affordability. Not sure if this is preferred on plan. Patient taking differently: Inject 20 Units into the skin 2 (two) times daily. 10/22/20   Velna Ochs, MD    Family History Family History  Problem Relation Age of Onset   Kidney failure Mother    Hypertension Father    Stroke Father    Other Sister    Other Brother    Other Brother    Other Brother    Other Brother    Other Brother    Other Brother    Other Brother    Other Brother    Healthy Daughter    Healthy Daughter    Healthy Daughter    Healthy Son     Social History Social History   Tobacco Use   Smoking status: Former    Packs/day: 0.75    Years: 20.00    Total pack years: 15.00    Types: Cigarettes    Quit date: 07/08/1980    Years since quitting: 41.2   Smokeless tobacco: Never  Vaping Use   Vaping Use: Never used  Substance Use Topics   Alcohol use: No    Alcohol/week: 0.0 standard drinks of alcohol    Comment: 12/07/2012 "quit drinking alcohol > 25 yr ago"   Drug use: No     Allergies   Patient has no known allergies.   Review of Systems Review of Systems As per HPI  Physical Exam Triage Vital Signs ED Triage Vitals  Enc  Vitals Group     BP 09/13/21 1607 120/66     Pulse Rate 09/13/21 1607 94     Resp 09/13/21 1607 (!) 28     Temp 09/13/21 1607 (!) 102 F (38.9 C)     Temp Source 09/13/21 1607 Oral     SpO2 09/13/21 1607 99 %  Weight --      Height --      Head Circumference --      Peak Flow --      Pain Score 09/13/21 1604 0     Pain Loc --      Pain Edu? --      Excl. in [place]? --    No data found.  Updated Vital Signs BP 120/66 (BP Location: Left Arm)   Pulse 94   Temp (!) 102 F (38.9 C) (Oral)   Resp (!) 28   SpO2 99%   Visual Acuity Right Eye Distance:   Left Eye Distance:   Bilateral Distance:    Right Eye Near:   Left Eye Near:    Bilateral Near:     Physical Exam Vitals and nursing note reviewed. Exam conducted with a chaperone present.  Constitutional:      Appearance: He is ill-appearing. He is not toxic-appearing or diaphoretic.     Comments: Strong urine odor in room  HENT:     Head: Normocephalic and atraumatic.     Right Ear: There is impacted cerumen.     Left Ear: There is impacted cerumen.     Nose: Nose normal.     Mouth/Throat:     Mouth: Mucous membranes are moist.     Pharynx: No oropharyngeal exudate.  Eyes:     General: No scleral icterus.    Extraocular Movements: Extraocular movements intact.     Pupils: Pupils are equal, round, and reactive to light.  Cardiovascular:     Rate and Rhythm: Normal rate.  Pulmonary:     Effort: Pulmonary effort is normal.     Breath sounds: Rhonchi (generalized posterior lung fields) present. No wheezing or rales.  Chest:     Chest wall: No tenderness.  Abdominal:     Tenderness: There is right CVA tenderness.  Musculoskeletal:     Cervical back: Normal range of motion.  Lymphadenopathy:     Cervical: No cervical adenopathy.  Skin:    General: Skin is warm and dry.     Capillary Refill: Capillary refill takes less than 2 seconds.     Findings: No erythema or rash.  Neurological:     Mental Status: He is  alert.      UC Treatments / Results  Labs (all labs ordered are listed, but only abnormal results are displayed) Labs Reviewed  CBG MONITORING, ED - Abnormal; Notable for the following components:      Result Value   Glucose-Capillary 343 (*)    All other components within normal limits    EKG   Radiology DG Chest 2 View  Result Date: 09/13/2021 CLINICAL DATA:  Fever EXAM: CHEST - 2 VIEW COMPARISON:  Chest x-ray 05/26/2013 FINDINGS: The aorta is tortuous. Median sternotomy wires are present. Cardiomediastinal silhouette is within normal limits. There are minimal linear and patchy opacities in the left lower lobe. There is no pleural effusion or pneumothorax. No acute fractures are seen. IMPRESSION: 1. Minimal left lower lobe atelectasis/airspace disease. Electronically Signed   By: Ronney Asters M.D.   On: 09/13/2021 17:20    Procedures Procedures (including critical care time)  Medications Ordered in UC Medications  acetaminophen (TYLENOL) tablet 650 mg (650 mg Oral Given 09/13/21 1614)    Initial Impression / Assessment and Plan / UC Course  I have reviewed the triage vital signs and the nursing notes.  Pertinent labs & imaging results that were available during my care  of the patient were reviewed by me and considered in my medical decision making (see chart for details).     Acute confusional state -glucose is elevated, however this is to be expected as patient did not take his insulin this morning.  Waited over an hour and patient was unable to give a urine sample.  Chest x-ray concerning for possible developing pneumonia, given fever of 102 and respirations of 28 and acute confusional state, ER work-up is appropriate.  Patient discharged to the ER for further evaluation.   Final Clinical Impressions(s) / UC Diagnoses   Final diagnoses:  Acute confusional state     Discharge Instructions      Your glucose in 343, in part due to not getting your insulin  administered here today. You have a fever of 102 with a chest xray concerning for developing infection. Please head to the ER for a further workup.     ED Prescriptions   None    PDMP not reviewed this encounter.   Chaney Malling, [place] 09/13/21 1739

## 2021-09-14 ENCOUNTER — Observation Stay (HOSPITAL_COMMUNITY): Payer: Medicare PPO

## 2021-09-14 DIAGNOSIS — Z951 Presence of aortocoronary bypass graft: Secondary | ICD-10-CM

## 2021-09-14 DIAGNOSIS — I739 Peripheral vascular disease, unspecified: Secondary | ICD-10-CM

## 2021-09-14 DIAGNOSIS — G9349 Other encephalopathy: Secondary | ICD-10-CM

## 2021-09-14 DIAGNOSIS — I110 Hypertensive heart disease with heart failure: Secondary | ICD-10-CM | POA: Diagnosis present

## 2021-09-14 DIAGNOSIS — B9689 Other specified bacterial agents as the cause of diseases classified elsewhere: Secondary | ICD-10-CM | POA: Diagnosis not present

## 2021-09-14 DIAGNOSIS — Z955 Presence of coronary angioplasty implant and graft: Secondary | ICD-10-CM | POA: Diagnosis not present

## 2021-09-14 DIAGNOSIS — N3001 Acute cystitis with hematuria: Secondary | ICD-10-CM | POA: Diagnosis present

## 2021-09-14 DIAGNOSIS — N401 Enlarged prostate with lower urinary tract symptoms: Secondary | ICD-10-CM | POA: Diagnosis present

## 2021-09-14 DIAGNOSIS — N35919 Unspecified urethral stricture, male, unspecified site: Secondary | ICD-10-CM

## 2021-09-14 DIAGNOSIS — G9341 Metabolic encephalopathy: Secondary | ICD-10-CM | POA: Diagnosis present

## 2021-09-14 DIAGNOSIS — Z7982 Long term (current) use of aspirin: Secondary | ICD-10-CM | POA: Diagnosis not present

## 2021-09-14 DIAGNOSIS — Z87891 Personal history of nicotine dependence: Secondary | ICD-10-CM | POA: Diagnosis not present

## 2021-09-14 DIAGNOSIS — R739 Hyperglycemia, unspecified: Secondary | ICD-10-CM

## 2021-09-14 DIAGNOSIS — I2510 Atherosclerotic heart disease of native coronary artery without angina pectoris: Secondary | ICD-10-CM

## 2021-09-14 DIAGNOSIS — N390 Urinary tract infection, site not specified: Secondary | ICD-10-CM | POA: Diagnosis not present

## 2021-09-14 DIAGNOSIS — I5042 Chronic combined systolic (congestive) and diastolic (congestive) heart failure: Secondary | ICD-10-CM | POA: Diagnosis present

## 2021-09-14 DIAGNOSIS — R109 Unspecified abdominal pain: Secondary | ICD-10-CM | POA: Diagnosis not present

## 2021-09-14 DIAGNOSIS — E785 Hyperlipidemia, unspecified: Secondary | ICD-10-CM

## 2021-09-14 DIAGNOSIS — E1165 Type 2 diabetes mellitus with hyperglycemia: Secondary | ICD-10-CM

## 2021-09-14 DIAGNOSIS — Z791 Long term (current) use of non-steroidal anti-inflammatories (NSAID): Secondary | ICD-10-CM | POA: Diagnosis not present

## 2021-09-14 DIAGNOSIS — G9340 Encephalopathy, unspecified: Secondary | ICD-10-CM

## 2021-09-14 DIAGNOSIS — E1151 Type 2 diabetes mellitus with diabetic peripheral angiopathy without gangrene: Secondary | ICD-10-CM | POA: Diagnosis present

## 2021-09-14 DIAGNOSIS — Z8249 Family history of ischemic heart disease and other diseases of the circulatory system: Secondary | ICD-10-CM | POA: Diagnosis not present

## 2021-09-14 DIAGNOSIS — E871 Hypo-osmolality and hyponatremia: Secondary | ICD-10-CM | POA: Diagnosis present

## 2021-09-14 DIAGNOSIS — Z794 Long term (current) use of insulin: Secondary | ICD-10-CM | POA: Diagnosis not present

## 2021-09-14 DIAGNOSIS — Z79899 Other long term (current) drug therapy: Secondary | ICD-10-CM | POA: Diagnosis not present

## 2021-09-14 DIAGNOSIS — N35819 Other urethral stricture, male, unspecified site: Secondary | ICD-10-CM | POA: Diagnosis present

## 2021-09-14 LAB — COMPREHENSIVE METABOLIC PANEL
ALT: 15 U/L (ref 0–44)
AST: 22 U/L (ref 15–41)
Albumin: 3 g/dL — ABNORMAL LOW (ref 3.5–5.0)
Alkaline Phosphatase: 90 U/L (ref 38–126)
Anion gap: 9 (ref 5–15)
BUN: 25 mg/dL — ABNORMAL HIGH (ref 8–23)
CO2: 24 mmol/L (ref 22–32)
Calcium: 8.5 mg/dL — ABNORMAL LOW (ref 8.9–10.3)
Chloride: 102 mmol/L (ref 98–111)
Creatinine, Ser: 1.45 mg/dL — ABNORMAL HIGH (ref 0.61–1.24)
GFR, Estimated: 47 mL/min — ABNORMAL LOW (ref >60)
Glucose, Bld: 362 mg/dL — ABNORMAL HIGH (ref 70–99)
Potassium: 3.8 mmol/L (ref 3.5–5.1)
Sodium: 135 mmol/L (ref 135–145)
Total Bilirubin: 1.2 mg/dL (ref 0.3–1.2)
Total Protein: 6.5 g/dL (ref 6.5–8.1)

## 2021-09-14 LAB — CBC WITH DIFFERENTIAL/PLATELET
Abs Immature Granulocytes: 0.17 10*3/uL — ABNORMAL HIGH (ref 0.00–0.07)
Basophils Absolute: 0.1 10*3/uL (ref 0.0–0.1)
Basophils Relative: 0 %
Eosinophils Absolute: 0 10*3/uL (ref 0.0–0.5)
Eosinophils Relative: 0 %
HCT: 38.6 % — ABNORMAL LOW (ref 39.0–52.0)
Hemoglobin: 12.6 g/dL — ABNORMAL LOW (ref 13.0–17.0)
Immature Granulocytes: 1 %
Lymphocytes Relative: 8 %
Lymphs Abs: 1.8 10*3/uL (ref 0.7–4.0)
MCH: 30.4 pg (ref 26.0–34.0)
MCHC: 32.6 g/dL (ref 30.0–36.0)
MCV: 93.2 fL (ref 80.0–100.0)
Monocytes Absolute: 1.5 10*3/uL — ABNORMAL HIGH (ref 0.1–1.0)
Monocytes Relative: 7 %
Neutro Abs: 18.5 10*3/uL — ABNORMAL HIGH (ref 1.7–7.7)
Neutrophils Relative %: 84 %
Platelets: 185 10*3/uL (ref 150–400)
RBC: 4.14 MIL/uL — ABNORMAL LOW (ref 4.22–5.81)
RDW: 13.1 % (ref 11.5–15.5)
WBC: 22.1 10*3/uL — ABNORMAL HIGH (ref 4.0–10.5)
nRBC: 0 % (ref 0.0–0.2)

## 2021-09-14 LAB — CBG MONITORING, ED
Glucose-Capillary: 439 mg/dL — ABNORMAL HIGH (ref 70–99)
Glucose-Capillary: 426 mg/dL — ABNORMAL HIGH (ref 70–99)
Glucose-Capillary: 374 mg/dL — ABNORMAL HIGH (ref 70–99)

## 2021-09-14 LAB — HEMOGLOBIN A1C
Hgb A1c MFr Bld: 8.2 % — ABNORMAL HIGH (ref 4.8–5.6)
Mean Plasma Glucose: 188.64 mg/dL

## 2021-09-14 LAB — GLUCOSE, CAPILLARY
Glucose-Capillary: 270 mg/dL — ABNORMAL HIGH (ref 70–99)
Glucose-Capillary: 215 mg/dL — ABNORMAL HIGH (ref 70–99)
Glucose-Capillary: 143 mg/dL — ABNORMAL HIGH (ref 70–99)

## 2021-09-14 LAB — PROCALCITONIN: Procalcitonin: 1.48 ng/mL

## 2021-09-14 LAB — MAGNESIUM: Magnesium: 1.9 mg/dL (ref 1.7–2.4)

## 2021-09-14 MED ORDER — SODIUM CHLORIDE 0.9 % IV BOLUS
500.0000 mL | Freq: Once | INTRAVENOUS | Status: AC
  Administered 2021-09-14: 500 mL via INTRAVENOUS

## 2021-09-14 MED ORDER — ACETAMINOPHEN 325 MG PO TABS: 650.0000 mg | ORAL_TABLET | Freq: Four times a day (QID) | ORAL | Status: DC | PRN

## 2021-09-14 MED ORDER — ATORVASTATIN CALCIUM 40 MG PO TABS
40.0000 mg | ORAL_TABLET | Freq: Every day | ORAL | Status: DC
  Administered 2021-09-14 – 2021-09-15 (×2): 40 mg via ORAL
  Filled 2021-09-14 (×2): qty 1

## 2021-09-14 MED ORDER — INSULIN ASPART 100 UNIT/ML IJ SOLN
6.0000 [IU] | Freq: Once | INTRAMUSCULAR | Status: AC
  Administered 2021-09-14: 6 [IU] via INTRAVENOUS

## 2021-09-14 MED ORDER — ACETAMINOPHEN 650 MG RE SUPP: 650.0000 mg | Freq: Four times a day (QID) | RECTAL | Status: DC | PRN

## 2021-09-14 MED ORDER — PANTOPRAZOLE SODIUM 40 MG PO TBEC
40.0000 mg | DELAYED_RELEASE_TABLET | Freq: Every day | ORAL | Status: DC
  Administered 2021-09-14 – 2021-09-15 (×2): 40 mg via ORAL
  Filled 2021-09-14 (×2): qty 1

## 2021-09-14 MED ORDER — METOPROLOL TARTRATE 50 MG PO TABS
50.0000 mg | ORAL_TABLET | Freq: Two times a day (BID) | ORAL | Status: DC
  Administered 2021-09-14 – 2021-09-15 (×3): 50 mg via ORAL
  Filled 2021-09-14 (×2): qty 1
  Filled 2021-09-14: qty 2

## 2021-09-14 MED ORDER — SODIUM CHLORIDE 0.9 % IV SOLN
1.0000 g | Freq: Once | INTRAVENOUS | Status: AC
  Administered 2021-09-14: 1 g via INTRAVENOUS
  Filled 2021-09-14: qty 10

## 2021-09-14 MED ORDER — INSULIN GLARGINE-YFGN 100 UNIT/ML ~~LOC~~ SOLN
20.0000 [IU] | Freq: Every day | SUBCUTANEOUS | Status: DC
  Administered 2021-09-14 – 2021-09-15 (×2): 20 [IU] via SUBCUTANEOUS
  Filled 2021-09-14 (×3): qty 0.2

## 2021-09-14 MED ORDER — SODIUM CHLORIDE 0.9 % IV SOLN
1.0000 g | INTRAVENOUS | Status: DC
  Administered 2021-09-15: 1 g via INTRAVENOUS
  Filled 2021-09-14: qty 10

## 2021-09-14 MED ORDER — AMLODIPINE BESYLATE 10 MG PO TABS
10.0000 mg | ORAL_TABLET | Freq: Every day | ORAL | Status: DC
  Administered 2021-09-14 – 2021-09-15 (×2): 10 mg via ORAL
  Filled 2021-09-14: qty 1
  Filled 2021-09-14: qty 2

## 2021-09-14 MED ORDER — ASPIRIN 81 MG PO TBEC
81.0000 mg | DELAYED_RELEASE_TABLET | Freq: Every day | ORAL | Status: DC
  Administered 2021-09-14 – 2021-09-15 (×2): 81 mg via ORAL
  Filled 2021-09-14 (×2): qty 1

## 2021-09-14 MED ORDER — DICLOFENAC SODIUM 1 % EX GEL
2.0000 g | Freq: Three times a day (TID) | CUTANEOUS | Status: DC
Start: 2021-09-14 — End: 2021-09-15
  Filled 2021-09-14 (×2): qty 100

## 2021-09-14 MED ORDER — LORATADINE 10 MG PO TABS
10.0000 mg | ORAL_TABLET | Freq: Every day | ORAL | Status: DC
  Administered 2021-09-14 – 2021-09-15 (×2): 10 mg via ORAL
  Filled 2021-09-14 (×2): qty 1

## 2021-09-14 MED ORDER — TAMSULOSIN HCL 0.4 MG PO CAPS
0.4000 mg | ORAL_CAPSULE | Freq: Every day | ORAL | Status: DC
  Administered 2021-09-14 – 2021-09-15 (×2): 0.4 mg via ORAL
  Filled 2021-09-14 (×2): qty 1

## 2021-09-14 MED ORDER — INSULIN ASPART 100 UNIT/ML IJ SOLN
0.0000 [IU] | Freq: Three times a day (TID) | INTRAMUSCULAR | Status: DC
  Administered 2021-09-14: 15 [IU] via SUBCUTANEOUS
  Administered 2021-09-14: 5 [IU] via SUBCUTANEOUS
  Administered 2021-09-14: 8 [IU] via SUBCUTANEOUS
  Administered 2021-09-15: 5 [IU] via SUBCUTANEOUS
  Administered 2021-09-15: 3 [IU] via SUBCUTANEOUS

## 2021-09-14 MED ORDER — ENOXAPARIN SODIUM 40 MG/0.4ML IJ SOSY
40.0000 mg | PREFILLED_SYRINGE | Freq: Every day | INTRAMUSCULAR | Status: DC
  Administered 2021-09-14 – 2021-09-15 (×2): 40 mg via SUBCUTANEOUS
  Filled 2021-09-14 (×2): qty 0.4

## 2021-09-14 NOTE — Progress Notes (Signed)
NAME:  [name] [name], MRN:  [id number], DOB:  [birth year], LOS: 0 ADMISSION DATE:  09/13/2021  Subjective  Patient evaluated at bedside this AM. Appears to be resting comfortably and in no distress. Patient reports feeling well, denies CVA tenderness, chest pain, or shortness of breath. When asked if he recalled the reason for his admission, patient made a vague reference to his blood sugar being out of control.   Patient was evaluated later in the afternoon. Family was present, and both daughter and wife feel that he should stay an additional day, do feel he is fully at baseline. Patient endorses some abdominal discomfort he describes as "rumbling", denies diarrhea or changes in bowel movement. Otherwise, has no concerns. He is in agreement with treatment plan and staying an additional day.   Objective   Blood pressure (!) 165/69, pulse 79, temperature 98.5 F (36.9 C), temperature source Oral, resp. rate 18, height '6\' 1"'$  (1.854 m), weight 113.9 kg, SpO2 100 %.     Intake/Output Summary (Last 24 hours) at 09/14/2021 1549 Last data filed at 09/14/2021 1336 Gross per 24 hour  Intake 839.83 ml  Output 250 ml  Net 589.83 ml   Filed Weights   09/13/21 1858 09/14/21 1135  Weight: 116.5 kg 113.9 kg    Physical Exam General: No acute distress. Awake and conversant. Eyes: Normal conjunctiva, anicteric. Round symmetric pupils. ENT: Hard of hearing. No nasal discharge. Neck: Neck is supple. No masses or thyromegaly. Respiratory: Respirations are non-labored. No wheezing. Skin: Warm. No rashes or ulcers. Psych: Alert and oriented. Cooperative, Appropriate mood and affect, Normal judgment. CV: No lower extremity edema. MSK: Normal ambulation. No clubbing or cyanosis. Neuro: A&O X2. Sensation and CN II-XII grossly normal.   Labs       Latest Ref Rng & Units 09/14/2021    6:29 AM 09/13/2021    8:10 PM 09/14/2020   11:15 AM  CBC  WBC 4.0 - 10.5 K/uL 22.1  24.6  8.4   Hemoglobin 13.0 -  17.0 g/dL 12.6  13.8  13.9   Hematocrit 39.0 - 52.0 % 38.6  40.8  41.6   Platelets 150 - 400 K/uL 185  206  161       Latest Ref Rng & Units 09/14/2021    6:29 AM 09/13/2021    8:10 PM 06/21/2021   10:41 AM  BMP  Glucose 70 - 99 mg/dL 362  341  155   BUN 8 - 23 mg/dL '25  21  20   '$ Creatinine 0.61 - 1.24 mg/dL 1.45  1.42  1.36   BUN/Creat Ratio 10 - 24   15   Sodium 135 - 145 mmol/L 135  133  141   Potassium 3.5 - 5.1 mmol/L 3.8  4.0  4.0   Chloride 98 - 111 mmol/L 102  100  105   CO2 22 - 32 mmol/L '24  23  21   '$ Calcium 8.9 - 10.3 mg/dL 8.5  8.7  8.7     Summary  [name] [name] is an 88 year old male with past medical history HTN, HLD, PAD, T0PT, combined systolic and diastolic heart failure, CAD status post CABG 2014, BPH and urethral stricture who presented with confusion and admitted for acute encephalopathy 2/2 UTI on hospital day 1. Today he reports feeling well, appears alert and oriented. Family felt he needed an additional day to make sure patient's mental status was at baseline.  #Acute infectious encephalopathy 2/2 UTI #BPH with urethral stricture Patient with past  medical history of urinary retention and prior prostate surgery presents with 1-2 days of confusion fevers, dysuria and UA concerning for UTI.  He is hemodynamically stable with no concern for sepsis at this point.  Creatinine 1.42 at presentation, elevated but not AKI criteria in this patient with baseline 1.2-1.4.  Mild concern for pneumonia on chest x-ray at urgent care, but patient has regular oxygenation/respiratory status, so low clinical concern for pneumonia at this time.  No focal neurological deficits, Low likelihood of acute infarct.  Low concern for toxicity at this point as well and no recent med changes. - Continue IV ceftriaxone.  Consider D/C on p.o. Keflex - Tylenol as needed for fevers - Renal US - Trend BMP - Follow-up blood culture, urine culture   #T2DM Hyperglycemic in the 300s to 400s in  the past day because he did not take his insulin.  Status post 6 units NovoLog in the ED. - Continue insulin Semglee 20 units plus SSI ACHS -- Trend glucose   #HTN #Combined systolic and diastolic heart failure #HLD #CAD status post CABG #PAD On amlodipine 10 daily, losartan 50 mg daily, metoprolol tartrate 25 mg twice daily, Lasix 20 mg daily at home.  Last echo in 2017 showing EF 45-50% and grade 2 diastolic dysfunction.  Low concern for CHF exacerbation at this point.  No chest pain and low concern for ischemic event at this point as well.  Slightly hypertensive in the ED. - Consider continuing home amlodipine, metoprolol, losartan.  Hold lasix for now. - Continue home aspirin 81 daily and atorvastatin 40 mg daily  Best practice:  DIET: Herat healthy IVF:n None DVT PPX: Enoxaparin BOWEL: None CODE: FULL   Christene Slates, PGY-1 Internal Medicine Residency Pager: [phone]  09/14/2021 3:49 PM  If after hours (below), please contact on-call pager: [phone] 5PM-7AM Monday-Friday 1PM-7AM Saturday-Sunday

## 2021-09-14 NOTE — ED Notes (Signed)
ED Provider at bedside. 

## 2021-09-14 NOTE — Hospital Course (Addendum)
Not sure why he is here in the hospital.   Wife says he didn't get up to eat his breakfast. She kept encouraging him to eat. He could not figure out his insulin. Daughter came by and he was still havin gdifficulty checkin ghis BG and giving himself insuiln. They went to UC and he had a fever.   Felt like he was acting differently.   No trouble breathing. Does have a cough, felt like it was related to his allergies. Did see some mucus with cough.Green/yellow with color.   Peeing more frequently over the last couple of days, no abd pain, no N/V. Some burning with peeing.   Chest hurting. Feel like he gets this after eating only. No sharp pain. No chest pressure. Took reflux medicines and this helped.   R flank hurting. No lightheadedness or dizziness unless he waits too long to eat.  No HA no weakness.   PMH:  HTN HLD T2DM   Medications:  Amlodipine is out but pill box Asa '81mg'$   Atorvastatin '40mg'$   Lasix '20mg'$  qd Novolin N Novolin R Losartan '50mg'$  qd Metoprolol tartrate '50mg'$  BID Loratidine   PSH:    SH: No smoking. Used to smoke 1ppd quit at 37.  [place] shopping. Drives. Recently went out and got lost. Does not walk much before he gets short of breath.  Uses cane to walk.   No suprapubic tenderness  Referral to geriatrics clinic.  [facility] urology stricture prostate cancer    Admitted 09/13/2021  Allergies: Patient has no known allergies. Pertinent Hx: HTN, HLD, PAD, combined HF (EF 45%), CAD s/p CABG  88 y.o. male p/w AMS  * Acute encephalopathy 2/2 UTI: Mental status has improved, close to baseline. Knows name, where he is - converses appropriately. Getting IV Rocephin, likely discharge tomorrow.   Consults: n/a  Meds: ceftriaxone, metop, ASA VTE ppx: lovenox IVF: n/a Diet: Reg    ----------- 8/13: Patient states that he slept well and is feeling well overall today. He remembers seeing his family yesterday. He states that he has been up and walking  around in preparation for discharge. Discussed that we will continue antibiotics today and that he will likely go home today. Patient agrees with the plan.

## 2021-09-14 NOTE — H&P (Addendum)
Date: 09/14/2021               Patient Name:  [name] [name] MRN: [id number]  DOB: 1931/12/24 Age / Sex: 88 y.o., male   PCP: [name], MD         Medical Service: Internal Medicine Teaching Service         Attending Physician: Dr. Lottie Mussel, MD    First Contact: [name] Blamer, DO      Pager: Lavone Nian [phone]      Second Contact: Sanjuana Letters, DO      Pager: Maryjean Morn [phone]           After Hours (After 5p/  First Contact Pager: [phone]  weekends / holidays): Second Contact Pager: [phone]   SUBJECTIVE   Chief Complaint: confusion  History of Present Illness: [name] [name] is an 88 year old male with past medical history HTN, HLD, PAD, E7OJ, combined systolic and diastolic heart failure, CAD status post CABG 2014, BPH and urethral stricture presented to the ED from urgent care for acute onset confusion.  History obtained from patient, wife and daughter/son-in-law.  Family noticed that he was not able to administer insulin and was not eating yesterday morning like he normally does.  They state that he is typically able to organize his meds and take them on time.  His daughter was concerned that he seemed more confused than usual so she took him to the urgent care.  He endorses some dysuria over the past 2 days and some increased urinary frequency.  He mentions that he has had a cough productive of yellowish phlegm, but that this has been going on for a long time and is not worse in the acute setting.  Endorses increased weakness and some lightheadedness, but no loss of consciousness.  He endorses some epigastric pain which he associates with indigestion.  He additionally endorses flank pain, but says this is better now.  He denies chest pain, dyspnea, abdominal pain, nausea vomiting diarrhea, headaches, vision changes, or any focal neurological weakness and numbness.  After family took him to the urgent care, he was found to be febrile with increased  respirations, CVA tenderness, and rhonchi on respiratory exam with possible concern for pneumonia on chest x-ray.  Of note, he was unable to give a urine sample there.  He was also hyperglycemic because he did not take his insulin yesterday.  He was referred to the ED from urgent care.  ED Course: Diagnostic work-up as below, see ED provider note for full details.  In general, no respiratory concerns, afebrile and hemodynamically stable on presentation.  UA concerning for UTI.  Blood cultures ordered.  Stroke work-up deferred due to lack of focal neurological deficits.  Leukocytosis at 24,000, mild hyponatremia, and hyperglycemia on initial work-up.  Patient started on IV ceftriaxone after 500 cc bolus NS.  Also given 6 units of NovoLog.  Meds:  Amlodipine is out but in pill box Asa '81mg'$  daily Atorvastatin '40mg'$  daily Lasix '20mg'$  qd Novolin N Novolin R Losartan '50mg'$  qd Metoprolol tartrate '50mg'$  BID Loratidine   Past Medical History  Past Surgical History:  Procedure Laterality Date   CARDIAC CATHETERIZATION  10/05/08   REVEALS HYPOKINESIS OF THE LATERAL WALL AND EF 50-55%   CORONARY ANGIOPLASTY WITH STENT PLACEMENT     CORONARY ARTERY BYPASS GRAFT N/A 01/05/2013   Procedure: CORONARY ARTERY BYPASS GRAFTING (CABG);  Surgeon: Melrose Nakayama, MD;  Location: [facility];  Service: Open Heart Surgery;  Laterality: N/A;  INTRAOPERATIVE TRANSESOPHAGEAL ECHOCARDIOGRAM N/A 01/05/2013   Procedure: INTRAOPERATIVE TRANSESOPHAGEAL ECHOCARDIOGRAM;  Surgeon: Melrose Nakayama, MD;  Location: [facility];  Service: Open Heart Surgery;  Laterality: N/A;   LEFT HEART CATHETERIZATION WITH CORONARY ANGIOGRAM N/A 01/03/2013   Procedure: LEFT HEART CATHETERIZATION WITH CORONARY ANGIOGRAM;  Surgeon: Lorretta Harp, MD;  Location: [facility] CATH LAB;  Service: Cardiovascular;  Laterality: N/A;   LOWER EXTREMITY ANGIOGRAM Right 12/07/2012   unsuccessful attempt at percutaneous revascularization of a calcified long segment  chronic total occlusion mid left SFA /notes 12/07/2012   LOWER EXTREMITY ANGIOGRAM N/A 12/07/2012   Procedure: LOWER EXTREMITY ANGIOGRAM;  Surgeon: Lorretta Harp, MD;  Location: [facility] CATH LAB;  Service: Cardiovascular;  Laterality: N/A;   PROSTATE SURGERY  1990's    Social:  Lives With: Wife at home Occupation: Support: Family support, has daughter and son-in-law come by for visits frequently. Level of Function: Ambulatory with a cane.  Unable to walk moderate distances or up a flight of stairs without getting short of breath.  Driving, but sometimes gets lost.  Able to perform most of his ADLs and ADLs by himself. PCP: [facility], Dr. Daryll Drown Substances: Prior 15-pack-year smoker, quit about 50 years ago.  No alcohol use and no other substance use.  Family History:  Family History  Problem Relation Age of Onset   Kidney failure Mother    Hypertension Father    Stroke Father    Other Sister    Other Brother    Other Brother    Other Brother    Other Brother    Other Brother    Other Brother    Publishing copy Daughter    Healthy Daughter    Healthy Son      Allergies: Allergies as of 09/13/2021   (No Known Allergies)    Review of Systems: A complete ROS was negative except as per HPI.   OBJECTIVE:   Physical Exam: Blood pressure (!) 166/76, pulse 88, temperature 98.9 F (37.2 C), temperature source Oral, resp. rate (!) 28, height '6\' 1"'$  (1.854 m), weight 116.5 kg, SpO2 99 %.  Constitutional: Elderly appearing male laying in bed, in no acute distress.  Pleasant and interactive responds to most questions.  Hard of hearing Cardiovascular: Irregular rhythm, rate WNL, no murmurs, rubs or gallops Pulmonary/Chest: normal work of breathing on room air, lungs clear to auscultation bilaterally Abdominal: soft, non-tender, mildly distended.  Endorses minimal CVA tenderness Extremities: No BLE edema.  Extremity pulses 2+. Neuro: A&O x3.   Moves all extremities spontaneously with no deficits in strength.  Labs:    Latest Ref Rng & Units 09/14/2021    6:29 AM 09/13/2021    8:10 PM 09/14/2020   11:15 AM 06/24/2019   10:39 AM 05/12/2017   12:00 AM 08/06/2015   11:52 AM 05/25/2013   11:53 PM  CBC EXTENDED  WBC 4.0 - 10.5 K/uL 22.1  24.6  8.4  10.9  9.8  11.6  9.8   RBC 4.22 - 5.81 MIL/uL 4.14  4.46  4.49  4.55  4.56  4.75  4.77   Hemoglobin 13.0 - 17.0 g/dL 12.6  13.8  13.9  14.2  13.6  14.5  13.8   HCT 39.0 - 52.0 % 38.6  40.8  41.6  41.9  40.2  44.4  42.0   Platelets 150 - 400 K/uL 185  206  161  182  205  199  188   NEUT# 1.7 - 7.7 K/uL 18.5     6,350     Lymph# 0.7 - 4.0 K/uL 1.8     2,470         Latest Ref Rng & Units 09/13/2021    8:10 PM 06/21/2021   10:41 AM 12/14/2020   10:55 AM 09/14/2020   11:15 AM 06/28/2020    2:40 PM 03/09/2020   10:40 AM 12/09/2019   10:56 AM  CMP  Glucose 70 - 99 mg/dL 341  155  110  197  320  238  145   BUN 8 - 23 mg/dL '21  20  12  12  18  13  16   '$ Creatinine 0.61 - 1.24 mg/dL 1.42  1.36  1.24  1.33  1.43  1.24  1.20   Sodium 135 - 145 mmol/L 133  141  143  139  137  141  145   Potassium 3.5 - 5.1 mmol/L 4.0  4.0  3.8  3.7  4.6  3.6  4.3   Chloride 98 - 111 mmol/L 100  105  107  103  99  106  107   CO2 22 - 32 mmol/L '23  21  22  21  21  23  24   '$ Calcium 8.9 - 10.3 mg/dL 8.7  8.7  8.9  9.0  9.1  8.7  9.4   Total Protein 6.0 - 8.5 g/dL  6.5     6.5    Total Bilirubin 0.0 - 1.2 mg/dL  0.5     0.4    Alkaline Phos 44 - 121 IU/L  106     117    AST 0 - 40 IU/L  16     18    ALT 0 - 44 IU/L  13     19     Last 3 CBGs 439, 364, 343 UA showing moderate Hgb, greater than 300 protein large leukocytes, greater than 50 WBCs, many bacteria Urine cultures pending Blood cultures pending   Imaging: Chest x-ray:IMPRESSION: 1. Minimal left lower lobe atelectasis/airspace disease.  EKG: personally reviewed my interpretation is normal sinus rhythm with PACs. Prior EKG 03/01/2021  ASSESSMENT & PLAN:   Heru Montz is an 88 year old male with past medical history HTN, HLD, PAD, O7FI, combined systolic and diastolic heart failure, CAD status post CABG 2014, BPH and urethral stricture who presented with confusion and admitted for acute encephalopathy 2/2 UTI on hospital day 0  #Acute infectious encephalopathy 2/2 UTI #BPH with urethral stricture Patient with past medical history of urinary retention and prior prostate surgery presents with 1-2 days of confusion fevers, dysuria and UA concerning for UTI.  He is hemodynamically stable with no concern for sepsis at this point.  Creatinine 1.42 at presentation, elevated but not AKI criteria in this patient with baseline 1.2-1.4.  Mild concern for pneumonia on chest x-ray at urgent care, but patient has regular oxygenation/respiratory status, so low clinical concern for pneumonia at this time.  No focal neurological deficits, Low likelihood of acute infarct.  Low concern for toxicity at this point as well and no recent med changes. - Continue IV ceftriaxone.  Consider D/C on p.o. Keflex - Tylenol as needed for fevers - Renal US - Trend BMP - Follow-up blood culture, urine culture - Consider PT OT work-up patient endorses continued weakness - Patient has a prescription of Flomax 0.4 mg daily, but did not have that present with him and family is unsure  if he is taking it.  Would continue/represcribe this and perform bladder scan.  #T2DM Hyperglycemic in the 300s to 400s in the past day because he did not take his insulin.  Status post 6 units NovoLog in the ED. - Start insulin Semglee 20 units plus SSI ACHS  #HTN #Combined systolic and diastolic heart failure #HLD #CAD status post CABG #PAD On amlodipine 10 daily, losartan 50 mg daily, metoprolol tartrate 25 mg twice daily, Lasix 20 mg daily at home.  Last echo in 2017 showing EF 45-50% and grade 2 diastolic dysfunction.  Low concern for CHF exacerbation at this point.  No chest pain and low  concern for ischemic event at this point as well.  Slightly hypertensive in the ED. - Consider continuing home amlodipine, metoprolol, losartan.  Can hold Lasix today. - Continue home aspirin 81 daily and atorvastatin 40 mg daily  Diet: Heart healthy VTE: Enoxaparin IVF: None Code: Full  Prior to Admission Living Arrangement: Home, living with his wife Anticipated Discharge Location: Home Barriers to Discharge: Doctors work-up and treatment  Dispo: Admit patient to Observation with expected length of stay less than 2 midnights.  Signed: Linus Galas, MD Internal Medicine Resident PGY-1  09/14/2021, 6:34 AM

## 2021-09-14 NOTE — ED Provider Notes (Signed)
[facility] EMERGENCY DEPARTMENT Provider Note   CSN: [id number] Arrival date & time: 09/13/21  1746     History  Chief Complaint  Patient presents with   Altered Mental Status    [name] [name] is a 88 y.o. male.  Patient with history of diabetes on insulin presents with urticaria for confusion since this morning.  Patient had fever yesterday.  Normally patient can ambulate without difficulty and is independent living with his wife.  Patient had a chest x-ray at urgent care concern for possible pneumonia.  Patient's had mild cough nonproductive.  No breathing difficulties.  Today patient decreased appetite and not taking his medications.  No stroke history.  No head injury.       Home Medications Prior to Admission medications   Medication Sig Start Date End Date Taking? Authorizing Provider  Accu-Chek Softclix Lancets lancets Use to check blood sugar twice a day 03/13/21   Elayne Snare, MD  allopurinol (ZYLOPRIM) 100 MG tablet Take 2 tablets (200 mg total) by mouth daily. 11/01/20   Iona Beard, MD  amLODipine (NORVASC) 10 MG tablet Take 1 tablet (10 mg total) by mouth daily. 01/29/21   Nahser, Wonda Cheng, MD  aspirin EC 81 MG tablet Take 1 tablet (81 mg total) by mouth daily. 10/31/16   Nahser, Wonda Cheng, MD  atorvastatin (LIPITOR) 40 MG tablet Take 1 tablet (40 mg total) by mouth daily. 09/14/20   [name], MD  Blood Glucose Monitoring Suppl (ACCU-CHEK GUIDE ME) w/Device KIT Use to check blood sugar twice a day 03/13/21   Elayne Snare, MD  colchicine 0.6 MG tablet Take 1 tablet by mouth twice daily 05/24/21   [name], MD  diclofenac Sodium (VOLTAREN) 1 % GEL Apply 2 g topically 4 (four) times daily. 07/23/21   [name], MD  furosemide (LASIX) 20 MG tablet Take 1 tablet (20 mg total) by mouth daily. 08/05/21   Angelique Blonder, DO  gabapentin (NEURONTIN) 100 MG capsule Take 2 capsules (200 mg total) by mouth 3 (three) times daily. 06/27/21   Timothy Lasso, MD  glucose blood (ACCU-CHEK GUIDE) test strip USE TO CHECK BLOOD SUGAR THREE TIMES A DAY 07/23/21   [name], MD  insulin aspart (NOVOLOG FLEXPEN) 100 UNIT/ML FlexPen Inject 12 Units into the skin 2 (two) times daily with a meal. 08/05/21   Angelique Blonder, DO  insulin detemir (LEVEMIR FLEXPEN) 100 UNIT/ML FlexPen Inject 20 Units into the skin daily. 08/05/21   Angelique Blonder, DO  insulin NPH Human (NOVOLIN N) 100 UNIT/ML injection Inject 0.2 mLs (20 Units total) into the skin daily before breakfast. Per Dr [name] OV note of 11/17/19 09/14/20   [name], MD  Insulin Pen Needle (PEN NEEDLES) 32G X 5 MM MISC 1 each by Does not apply route in the morning, at noon, and at bedtime. 08/05/21   Angelique Blonder, DO  insulin regular (NOVOLIN R RELION) 100 units/mL injection Inject 0.12 mLs (12 Units total) into the skin 2 (two) times daily before a meal. Before breakfast and supper 06/21/21   [name], MD  Insulin Syringe-Needle U-100 31G X 15/64" 0.3 ML MISC Use to inject insulin 3 times a day 08/25/19   [name], MD  lactulose (CHRONULAC) 10 GM/15ML solution Take 15 mLs (10 g total) by mouth daily. 01/11/21   [name], MD  loratadine (EQ LORATADINE) 10 MG tablet TAKE 1 TABLET BY MOUTH DAILY AS NEEDED FOR RHINITIS. Strength: 10  mg 08/05/21   Angelique Blonder, DO  losartan (COZAAR) 50 MG tablet Take 1 tablet (50 mg total) by mouth daily. 10/18/20   [name], MD  meloxicam (MOBIC) 15 MG tablet Take 1 tablet (15 mg total) by mouth daily. 06/24/21   Vanessa Kick, MD  metoprolol tartrate (LOPRESSOR) 25 MG tablet Take 2 tablets (50 mg total) by mouth 2 (two) times daily. 08/05/21   Angelique Blonder, DO  pantoprazole (PROTONIX) 40 MG tablet TAKE 1 TABLET BY MOUTH TWICE DAILY 30  MINUTES  BEFORE  MEALS 06/27/21   [name], MD  senna-docusate (SENOKOT S) 8.6-50 MG tablet Take 2 tablets by mouth 2 (two) times daily. 11/01/20   Iona Beard, MD  tamsulosin (FLOMAX) 0.4 MG CAPS capsule  Take 1 capsule (0.4 mg total) by mouth daily. 09/14/20   [name], MD  trospium (SANCTURA) 20 MG tablet Take 20 mg by mouth in the morning and at bedtime. 05/24/21   [provider]  insulin glargine-yfgn (SEMGLEE) 100 UNIT/ML Pen Inject 20 Units into the skin 2 (two) times daily. Can you message me [name]) with cost after running this. Patient has issues with affordability. Not sure if this is preferred on plan. Patient taking differently: Inject 20 Units into the skin 2 (two) times daily. 10/22/20   Velna Ochs, MD      Allergies    Patient has no known allergies.    Review of Systems   Review of Systems  Unable to perform ROS: Mental status change    Physical Exam Updated Vital Signs BP (!) 166/76   Pulse 88   Temp 98.9 F (37.2 C) (Oral)   Resp (!) 28   Ht _0  (1.854 m)   Wt 116.5 kg   SpO2 99%   BMI 33.89 kg/m  Physical Exam Vitals and nursing note reviewed.  Constitutional:      General: He is not in acute distress.    Appearance: He is well-developed.  HENT:     Head: Normocephalic and atraumatic.     Mouth/Throat:     Mouth: Mucous membranes are dry.  Eyes:     General:        Right eye: No discharge.        Left eye: No discharge.     Conjunctiva/sclera: Conjunctivae normal.  Neck:     Trachea: No tracheal deviation.  Cardiovascular:     Rate and Rhythm: Normal rate and regular rhythm.  Pulmonary:     Effort: Pulmonary effort is normal.     Breath sounds: Normal breath sounds.  Abdominal:     General: There is no distension.     Palpations: Abdomen is soft.     Tenderness: There is no abdominal tenderness. There is no guarding.  Musculoskeletal:     Cervical back: Normal range of motion and neck supple. No rigidity.  Skin:    General: Skin is warm.     Capillary Refill: Capillary refill takes less than 2 seconds.     Findings: No rash.  Neurological:     General: No focal deficit present.     Mental Status: He is  alert.     GCS: GCS eye subscore is 4. GCS verbal subscore is 4. GCS motor subscore is 6.     Comments: Patient has mild general confusion.  Patient moves all extremities equal bilateral no obvious arm drift, finger-nose intact.  Horizontal eye movements intact, pupils equal bilateral.  No aphasia.  At times patient needs repeating for him to answer appropriately.  Psychiatric:        Mood and Affect: Mood normal.     ED Results / Procedures / Treatments   Labs (all labs ordered are listed, but only abnormal results are displayed) Labs Reviewed  CBC - Abnormal; Notable for the following components:      Result Value   WBC 24.6 (*)    All other components within normal limits  BASIC METABOLIC PANEL - Abnormal; Notable for the following components:   Sodium 133 (*)    Glucose, Bld 341 (*)    Creatinine, Ser 1.42 (*)    Calcium 8.7 (*)    GFR, Estimated 48 (*)    All other components within normal limits  URINALYSIS, ROUTINE W REFLEX MICROSCOPIC - Abnormal; Notable for the following components:   Color, Urine AMBER (*)    APPearance CLOUDY (*)    Glucose, UA 50 (*)    Hgb urine dipstick MODERATE (*)    Protein, ur >=300 (*)    Leukocytes,Ua LARGE (*)    WBC, UA >50 (*)    Bacteria, UA MANY (*)    All other components within normal limits  CBG MONITORING, ED - Abnormal; Notable for the following components:   Glucose-Capillary 364 (*)    All other components within normal limits  CBG MONITORING, ED - Abnormal; Notable for the following components:   Glucose-Capillary 439 (*)    All other components within normal limits  CULTURE, BLOOD (ROUTINE X 2)  CULTURE, BLOOD (ROUTINE X 2)  URINE CULTURE  CBC WITH DIFFERENTIAL/PLATELET  MAGNESIUM  COMPREHENSIVE METABOLIC PANEL  PROCALCITONIN    EKG EKG Interpretation  Date/Time:  Friday September 13 2021 19:02:37 EDT Ventricular Rate:  90 PR Interval:  148 QRS Duration: 92 QT Interval:  382 QTC Calculation: 467 R  Axis:   12 Text Interpretation: Sinus rhythm with Premature atrial complexes Nonspecific T wave abnormality Prolonged QT Abnormal ECG When compared with ECG of 23-Jul-2021 11:35, PREVIOUS ECG IS PRESENT Confirmed by Elnora Morrison [phone]) on 09/14/2021 3:51:15 AM  Radiology DG Chest 2 View  Result Date: 09/13/2021 CLINICAL DATA:  Fever EXAM: CHEST - 2 VIEW COMPARISON:  Chest x-ray 05/26/2013 FINDINGS: The aorta is tortuous. Median sternotomy wires are present. Cardiomediastinal silhouette is within normal limits. There are minimal linear and patchy opacities in the left lower lobe. There is no pleural effusion or pneumothorax. No acute fractures are seen. IMPRESSION: 1. Minimal left lower lobe atelectasis/airspace disease. Electronically Signed   By: Ronney Asters M.D.   On: 09/13/2021 17:20    Procedures Procedures    Medications Ordered in ED Medications  enoxaparin (LOVENOX) injection 40 mg (has no administration in time range)  acetaminophen (TYLENOL) tablet 650 mg (has no administration in time range)    Or  acetaminophen (TYLENOL) suppository 650 mg (has no administration in time range)  cefTRIAXone (ROCEPHIN) 1 g in sodium chloride 0.9 % 100 mL IVPB (0 g Intravenous Stopped 09/14/21 1607)  sodium chloride 0.9 % bolus 500 mL (0 mLs Intravenous Stopped 09/14/21 0609)  insulin aspart (novoLOG) injection 6 Units (6 Units Intravenous Given 09/14/21 0520)    ED Course/ Medical Decision Making/ A&P                           Medical Decision Making Amount and/or Complexity of Data Reviewed Labs: ordered. Radiology: ordered.  Risk Prescription drug management. Decision regarding  hospitalization.   Patient presents from urgent care for encephalopathy and concern for infection as a cause.  Clinical concern for UTI or pyelonephritis given age/presentation.  Other differentials include pneumonia however normal work of breathing/normal oxygenation, patient could have to infection at once.   Urinalysis ordered and reviewed independently showing hemoglobin, leukocytes, bacteria, culture ordered.  Blood cultures added as well given age, fever yesterday and altered mental status.  Other differentials include medication induced, other infectious etiologies, electrolyte related, other.  No signs of significant stroke on neuro exam, general weakness and mild encephalopathy.  Blood work results reviewed showing significant leukocytosis 24,000, mild hyponatremia and hyperglycemia 439.  Patient has not had insulin this evening, insulin 6 units ordered.  IV fluid bolus ordered.  Internal medicine teaching service consulted as patient follows with them outpatient.  IV Rocephin ordered for antibiotics.          Final Clinical Impression(s) / ED Diagnoses Final diagnoses:  Acute encephalopathy  Acute cystitis with hematuria  Hyponatremia  Hyperglycemia    Rx / DC Orders ED Discharge Orders     None         Elnora Morrison, MD 09/14/21 [phone]

## 2021-09-14 NOTE — ED Notes (Signed)
RN went into room and found pt sitting at the end of bed with urine on the floor. RN helped cleaned the pt up and changed linens. Pt redirected into bed, ambulatory with standby assistance. Pt set up for breakfast and given a pillow. Currently resting in bed with no complaints.

## 2021-09-14 NOTE — Discharge Summary (Signed)
Name: [name] [name] MRN: [id number] DOB: 01-01-1932 88 y.o. PCP: [name], MD  Date of Admission: 09/13/2021  6:34 PM Date of Discharge: 09/15/2021 Attending Physician: Lottie Mussel, MD  Discharge Diagnosis:  1. Acute metabolic encephalopathy d/t urinary tract infection 2/2 BPH 2. Type II diabetes mellitus  Discharge Medications: Allergies as of 09/15/2021   No Known Allergies      Medication List     TAKE these medications    allopurinol 100 MG tablet Commonly known as: ZYLOPRIM Take 2 tablets (200 mg total) by mouth daily.   amLODipine 10 MG tablet Commonly known as: NORVASC Take 1 tablet (10 mg total) by mouth daily.   aspirin EC 81 MG tablet Take 1 tablet (81 mg total) by mouth daily.   atorvastatin 40 MG tablet Commonly known as: LIPITOR Take 1 tablet (40 mg total) by mouth daily.   cefdinir 300 MG capsule Commonly known as: OMNICEF Take 1 capsule (300 mg total) by mouth 2 (two) times daily for 5 days. Start taking on: September 16, 2021   colchicine 0.6 MG tablet Take 1 tablet by mouth twice daily What changed:  when to take this reasons to take this   diclofenac Sodium 1 % Gel Commonly known as: Voltaren Apply 2 g topically 4 (four) times daily.   furosemide 20 MG tablet Commonly known as: LASIX Take 1 tablet (20 mg total) by mouth daily.   gabapentin 100 MG capsule Commonly known as: NEURONTIN Take 2 capsules (200 mg total) by mouth 3 (three) times daily. What changed:  how much to take when to take this   insulin NPH Human 100 UNIT/ML injection Commonly known as: NOVOLIN N Inject 0.2 mLs (20 Units total) into the skin daily before breakfast. Per Dr [name] OV note of 11/17/19   insulin regular 100 units/mL injection Commonly known as: NovoLIN R ReliOn Inject 0.12 mLs (12 Units total) into the skin 2 (two) times daily before a meal. Before breakfast and supper   Levemir FlexPen 100 UNIT/ML FlexPen Generic drug: insulin  detemir Inject 20 Units into the skin daily.   loratadine 10 MG tablet Commonly known as: EQ Loratadine TAKE 1 TABLET BY MOUTH DAILY AS NEEDED FOR RHINITIS. Strength: 10 mg What changed:  how much to take how to take this when to take this reasons to take this additional instructions   losartan 50 MG tablet Commonly known as: COZAAR Take 1 tablet (50 mg total) by mouth daily.   meloxicam 15 MG tablet Commonly known as: Mobic Take 1 tablet (15 mg total) by mouth daily.   metoprolol tartrate 25 MG tablet Commonly known as: LOPRESSOR Take 2 tablets (50 mg total) by mouth 2 (two) times daily.   NovoLOG FlexPen 100 UNIT/ML FlexPen Generic drug: insulin aspart Inject 12 Units into the skin 2 (two) times daily with a meal.   pantoprazole 40 MG tablet Commonly known as: PROTONIX TAKE 1 TABLET BY MOUTH TWICE DAILY 30  MINUTES  BEFORE  MEALS What changed: See the new instructions.   senna-docusate 8.6-50 MG tablet Commonly known as: Senokot S Take 2 tablets by mouth 2 (two) times daily. What changed:  when to take this reasons to take this   tamsulosin 0.4 MG Caps capsule Commonly known as: FLOMAX Take 1 capsule (0.4 mg total) by mouth daily.   trospium 20 MG tablet Commonly known as: SANCTURA Take 20 mg by mouth in the morning and at bedtime.  Disposition and follow-up:   DustinJavonta [name] was discharged from [facility] in Stable condition.  At the hospital follow up visit please address:  1.  Please ensure patient completes course of cefdinir, last date should be 8/18. UTI possibly from BPH, please make sure patient is taking Flomax.  2.  With A1c 8.2% would consider simplifying diabetes regimen.  3.  Labs / imaging needed at time of follow-up: BMP  4.  Pending labs/ test needing follow-up: urine culture sensitivities   Follow-up Appointments:  Follow-up Information     [name], MD. Schedule an appointment as soon as  possible for a visit in 1 week(s).   Specialty: Internal Medicine Contact information: [place] [address] [phone]         Nahser, Wonda Cheng, MD .   Specialty: Cardiology Contact information: [address] [place] [address] [phone]                 Hospital Course by problem list: 1. Acute metabolic encephalopathy due to urinary tract infection 2/2 BPH Patient arrived from home on 8/12 with altered mental status, fevers, and dysuria. He arrived hemodynamically stable without signs of sepsis. Initial lab work revealed mildly elevated creatinine from baseline and urinalysis consistent with infection. Patient was started on IV ceftriaxone and IMTS was consulted for admission. On hospital day 1, patient's mental status had already improved. He was eating, drinking, and able to engage in conversation appropriately. After discussions with patient's family, decision was made to keep Mr. Vanengen another night to ensure encephalopathy had resolved. Thankfully, Mr. Wann continued to improve and was completely back to baseline on day of discharge. He was transitioned to cefdinir, where he will complete a 7d course for complicated urinary tract infection.   2. Type II diabetes mellitus On arrival to [facility], glucose in 300-400's. Family reported patient was not taking his regular diabetes medications. A1c found to be 8.2%. Sugars were kept under control with long-acting and sliding scale insulins. He is to follow-up with his primary care physician for further recommendations.   Discharge Exam:   BP (!) 176/65 (BP Location: Right Arm)   Pulse (!) 59   Temp 99.1 F (37.3 C) (Oral)   Resp 18   Ht '6\' 1"'$  (1.854 m)   Wt 113.9 kg   SpO2 100%   BMI 33.12 kg/m  Discharge exam:  General: Resting comfortably in no acute distress CV: Regular rate, rhythm. No murmurs appreciated. Pulm: Normal work of breathing on room air. Clear to ausculation  bilaterally GI: Abdomen soft, non-tender, non-distended. Normoactive bowel sounds Neuro: Awake, alert, conversing appropriately. Grossly non-focal.  Pertinent Labs, Studies, and Procedures:     Latest Ref Rng & Units 09/14/2021    6:29 AM 09/13/2021    8:10 PM 09/14/2020   11:15 AM  CBC  WBC 4.0 - 10.5 K/uL 22.1  24.6  8.4   Hemoglobin 13.0 - 17.0 g/dL 12.6  13.8  13.9   Hematocrit 39.0 - 52.0 % 38.6  40.8  41.6   Platelets 150 - 400 K/uL 185  206  161       Latest Ref Rng & Units 09/14/2021    6:29 AM 09/13/2021    8:10 PM 06/21/2021   10:41 AM  BMP  Glucose 70 - 99 mg/dL 362  341  155   BUN 8 - 23 mg/dL '25  21  20   '$ Creatinine 0.61 - 1.24  mg/dL 1.45  1.42  1.36   BUN/Creat Ratio 10 - 24   15   Sodium 135 - 145 mmol/L 135  133  141   Potassium 3.5 - 5.1 mmol/L 3.8  4.0  4.0   Chloride 98 - 111 mmol/L 102  100  105   CO2 22 - 32 mmol/L '24  23  21   '$ Calcium 8.9 - 10.3 mg/dL 8.5  8.7  8.7    Discharge Instructions: Discharge Instructions     Call MD for:   Complete by: As directed    Call MD for:  difficulty breathing, headache or visual disturbances   Complete by: As directed    Call MD for:  extreme fatigue   Complete by: As directed    Call MD for:  hives   Complete by: As directed    Call MD for:  persistant dizziness or light-headedness   Complete by: As directed    Call MD for:  persistant nausea and vomiting   Complete by: As directed    Call MD for:  redness, tenderness, or signs of infection (pain, swelling, redness, odor or green/yellow discharge around incision site)   Complete by: As directed    Call MD for:  severe uncontrolled pain   Complete by: As directed    Call MD for:  temperature >100.4   Complete by: As directed    Diet Carb Modified   Complete by: As directed    Discharge instructions   Complete by: As directed    Hello Mr. Dolney, it was a pleasure taking care of you.  You were brought to the emergency department because you had a urine  infection, which likely caused your symptoms. You have improved with IV antibiotics and are ready to be discharged! Please see the following notes:   Please take the following medications at home: - Cefdinir 300 mg twice a day with meals for 5 days: 8/14-8/18 (received a dose of IV antibiotics on 8/13)  - No other medication changes at this time.   You will be contacted by your doctor's office for an appointment tomorrow for a follow-up appointment.  Thank you for letting us take care of you. We wish you all the best.  Thank you, Sanjuan Dame, MD Christene Slates, MD   Increase activity slowly   Complete by: As directed       Signed: Sanjuan Dame, MD 09/15/2021, 2:27 PM   Pager: [phone]

## 2021-09-15 LAB — GLUCOSE, CAPILLARY
Glucose-Capillary: 197 mg/dL — ABNORMAL HIGH (ref 70–99)
Glucose-Capillary: 238 mg/dL — ABNORMAL HIGH (ref 70–99)

## 2021-09-15 MED ORDER — CEFDINIR 300 MG PO CAPS: 300.0000 mg | ORAL_CAPSULE | Freq: Two times a day (BID) | ORAL | 0 refills | Status: AC

## 2021-09-16 ENCOUNTER — Ambulatory Visit: Payer: Self-pay

## 2021-09-16 LAB — URINE CULTURE: Culture: >=100000 — AB

## 2021-09-16 NOTE — Patient Outreach (Signed)
  Care Coordination TOC Note Transition Care Management Follow-up Telephone Call Date of discharge and from where: [facility] 09/14/21-09/15/21 How have you been since you were released from the hospital? "I am feeling good, still feel very tired." Any questions or concerns? No  Items Reviewed: Did the pt receive and understand the discharge instructions provided? Yes  Medications obtained and verified? Yes  Other? No  Any new allergies since your discharge? No  Dietary orders reviewed? Yes Do you have support at home? Yes -spouse  Home Care and Equipment/Supplies: Were home health services ordered? no If so, what is the name of the agency? N/A  Has the agency set up a time to come to the patient's home? not applicable Were any new equipment or medical supplies ordered?  No What is the name of the medical supply agency? N/A Were you able to get the supplies/equipment? no Do you have any questions related to the use of the equipment or supplies? No  Functional Questionnaire: (I = Independent and D = Dependent) ADLs: I  Bathing/Dressing- I  Meal Prep- I  Eating- I  Maintaining continence- I  Transferring/Ambulation- I  Managing Meds- I  Follow up appointments reviewed:  PCP Hospital f/u appt confirmed? Yes  Scheduled to see Dr. Simeon Craft on 09/21/21 @ 09:30. [facility] f/u appt confirmed?  N/A   Are transportation arrangements needed? No  If their condition worsens, is the pt aware to call PCP or go to the Emergency Dept.? Yes Was the patient provided with contact information for the PCP's office or ED? Yes Was to pt encouraged to call back with questions or concerns? Yes  SDOH assessments and interventions completed:   Yes  Care Coordination Interventions Activated:  No   Care Coordination Interventions:   No intervention required     Encounter Outcome:  Pt. Visit Completed

## 2021-09-16 NOTE — Progress Notes (Signed)
Patient has pan sensitive E coli UTI, so he was discharged on appropriate antibiotics. No further action needed.

## 2021-09-17 ENCOUNTER — Other Ambulatory Visit: Payer: Self-pay | Admitting: *Deleted

## 2021-09-17 DIAGNOSIS — E1129 Type 2 diabetes mellitus with other diabetic kidney complication: Secondary | ICD-10-CM

## 2021-09-17 MED ORDER — GABAPENTIN 100 MG PO CAPS: 200.0000 mg | ORAL_CAPSULE | Freq: Three times a day (TID) | ORAL | 3 refills | Status: DC

## 2021-09-17 NOTE — Telephone Encounter (Signed)
Call from pharmacy need new prescription for Gabapentin as Dr. Chauncy Passy is no longer here.

## 2021-09-17 NOTE — Progress Notes (Signed)
CC: hospital follow up  HPI:  [name] is a 88 y.o. with medical history of HTN, DMII, HLD, CAD, PAD, Gout, prostate cancer s/p radiotherapy, BPH presenting to [facility] for hospital follow up 8/12-8/13.   Please see problem-based list for further details, assessments, and plans.  Past Medical History:  Diagnosis Date   Benign prostatic hyperplasia (BPH) with urinary urge incontinence 11/13/2017   Chronic left shoulder pain 11/13/2017   Chronic non-seasonal allergic rhinitis 1/74/0814   Chronic systolic heart failure ([place]) 10/31/2016   Echo (05/03/2015): LVEF 48-18%, grade 2 diastolic dysfunction   Coronary artery disease involving native coronary artery of native heart without angina pectoris 09/30/2007   s/p CABG on 01/05/2013: left internal mammary artery to left anterior descending, saphenous vein graft to obtuse marginal 1, sequential saphenous vein graft to acute marginal and posterior descending   Essential hypertension 11/04/2005   Gastroesophageal reflux disease with hiatal hernia 11/04/2005   History of prostate cancer 02/17/2006   Diagnosed 1995, s/p seed implantation in the late 1990's, PSA undetectable 04/10/2016   Hyperlipidemia 11/04/2005   Obesity (BMI 30.0-34.9) 11/13/2017   Peripheral arterial disease ([place]) 09/30/2007   with claudication, failed attempt at LLE PTCA   Type 2 diabetes mellitus with microalbuminuria, with long-term current use of insulin ([place]) 11/13/2017   Type 2 diabetes mellitus with mild 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, bilateral ([place]) 03/24/2018   Type 2 diabetes mellitus with peripheral neuropathy ([place]) 11/04/2005   Urethral stricture in male 11/13/2017   Noted on cystoscopy 12/18/2014.  Reportedly asked to do chronic intermittent self catheterizations, but has not.    Current Outpatient Medications (Endocrine & Metabolic):    insulin aspart (NOVOLOG FLEXPEN) 100 UNIT/ML FlexPen, Inject 8 Units into the skin 3 (three)  times daily before meals.   insulin detemir (LEVEMIR FLEXPEN) 100 UNIT/ML FlexPen, Inject 20 Units into the skin daily.  Current Outpatient Medications (Cardiovascular):    atorvastatin (LIPITOR) 40 MG tablet, Take 1 tablet (40 mg total) by mouth daily.   furosemide (LASIX) 20 MG tablet, Take 1 tablet (20 mg total) by mouth daily.   losartan (COZAAR) 50 MG tablet, Take 1 tablet (50 mg total) by mouth daily.   metoprolol tartrate (LOPRESSOR) 25 MG tablet, Take 2 tablets (50 mg total) by mouth 2 (two) times daily.  Current Outpatient Medications (Respiratory):    loratadine (EQ LORATADINE) 10 MG tablet, TAKE 1 TABLET BY MOUTH DAILY AS NEEDED FOR RHINITIS. Strength: 10 mg (Patient taking differently: Take 10 mg by mouth daily as needed for allergies.)  Current Outpatient Medications (Analgesics):    allopurinol (ZYLOPRIM) 100 MG tablet, Take 2 tablets (200 mg total) by mouth daily.   aspirin EC 81 MG tablet, Take 1 tablet (81 mg total) by mouth daily.   colchicine 0.6 MG tablet, Take 1 tablet by mouth twice daily (Patient taking differently: Take 0.6 mg by mouth 2 (two) times daily as needed (gout flare).)   Current Outpatient Medications (Other):    Insulin Pen Needle (PEN NEEDLES) 31G X 5 MM MISC, Use with insulin 4 (four) times daily.   cefdinir (OMNICEF) 300 MG capsule, Take 1 capsule (300 mg total) by mouth 2 (two) times daily for 5 days.   diclofenac Sodium (VOLTAREN) 1 % GEL, Apply 2 g topically 4 (four) times daily.   gabapentin (NEURONTIN) 100 MG capsule, Take 2 capsules (200 mg total) by mouth 3 (three) times daily.   pantoprazole (PROTONIX) 40 MG tablet, TAKE 1 TABLET BY MOUTH  TWICE DAILY 30  MINUTES  BEFORE  MEALS (Patient taking differently: Take 40 mg by mouth 2 (two) times daily.)   tamsulosin (FLOMAX) 0.4 MG CAPS capsule, Take 1 capsule (0.4 mg total) by mouth daily.   trospium (SANCTURA) 20 MG tablet, Take 20 mg by mouth in the morning and at bedtime.  Review of Systems:   Review of system negative unless stated in the problem list or HPI.    Physical Exam: SpO2 initially at 74 but repeated on a different finger and was 99. He walked around to clinic with pulse ox without desaturation.  Vitals:   09/18/21 0851 09/18/21 0856  BP: 130/60 130/65  Pulse: 88 78  Temp: 98 F (36.7 C)   TempSrc: Oral   SpO2: (!) 74% 99%  Weight: 255 lb 14.4 oz (116.1 kg)   Height: '6\' 1"'$  (1.854 m)     Physical Exam General: NAD HENT: NCAT Lungs: CTAB, no wheeze, rhonchi or rales.  Cardiovascular: Normal heart sounds, no r/m/g, 2+ pulses in all extremities. No LE edema Abdomen: No TTP, normal bowel sounds MSK: No asymmetry or muscle atrophy.  Skin: no lesions noted on exposed skin Neuro: Alert and oriented x4. CN grossly intact Psych: Normal mood and normal affect   Assessment & Plan:   Type 2 diabetes mellitus with mild 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, bilateral (HCC) Patient's DMII is uncontrolled. A1c was 8.2 4 days ago.  Home monitoring with fasting glucose ranging 160s-260s. Current regimen includes Novolin N 20 units at breakfast and Novolin R 12-15 units with lunch and dinner. Reports good adherence. Regimen was changed during hospitalization but pt is unaware.  Diabetes not at goal on current regimen, likely 2/2 poor insight resulting in dietary indiscretions. Tolerating medication without adverse effects. Counseled on the benefits of daily exercise, limiting processed foods and high sugar foods, and weight loss. No hypoglycemic episodes. Denies polydipsia, polyuria, weight loss, lethargy, blurry vision, and changes in sensation. No wounds or ulcer of bilateral feet. Benign physical exam and vitals wnl.   -Changed pt's regimen to Insulin detemir to 20 units, and aspart to 8 units TID.  -Discontinue Novolin N and Novolin R due to difficulty in injecting. Follow up in one week for adjustments. -CTM CBG's and bring glucometer to next visit   -Continue lifestyle modifications  UTI (urinary tract infection) Patient was hospitalized for UTI. He is adherent to his antibiotics. He is asymptomatic. CBC was obtained that showed resolution of his leukocytosis. This is his 3rd UTI this year. He is not adherent to his flomax which I re-iterated the importance of to him and his daughter.  -Ensure compliance with Flomax.   Hyperlipidemia Lipid panel performed and LDL at 91. He is on lipitor 40 mg.  -Plan to continue lipitor at current dose.    Patient discussed with Dr. Denzil Magnuson, MD

## 2021-09-18 ENCOUNTER — Other Ambulatory Visit (HOSPITAL_COMMUNITY): Payer: Self-pay

## 2021-09-18 ENCOUNTER — Ambulatory Visit (INDEPENDENT_AMBULATORY_CARE_PROVIDER_SITE_OTHER): Payer: Medicare PPO | Admitting: Internal Medicine

## 2021-09-18 ENCOUNTER — Other Ambulatory Visit: Payer: Self-pay

## 2021-09-18 ENCOUNTER — Encounter: Payer: Self-pay | Admitting: Internal Medicine

## 2021-09-18 VITALS — BP 130/65 | HR 78 | Temp 98.0°F | Ht 73.0 in | Wt 255.9 lb

## 2021-09-18 DIAGNOSIS — Z794 Long term (current) use of insulin: Secondary | ICD-10-CM

## 2021-09-18 DIAGNOSIS — I10 Essential (primary) hypertension: Secondary | ICD-10-CM | POA: Diagnosis not present

## 2021-09-18 DIAGNOSIS — E7800 Pure hypercholesterolemia, unspecified: Secondary | ICD-10-CM | POA: Diagnosis not present

## 2021-09-18 DIAGNOSIS — E113293 Type 2 diabetes mellitus with mild 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, bilateral: Secondary | ICD-10-CM | POA: Diagnosis not present

## 2021-09-18 DIAGNOSIS — E785 Hyperlipidemia, unspecified: Secondary | ICD-10-CM

## 2021-09-18 DIAGNOSIS — N390 Urinary tract infection, site not specified: Secondary | ICD-10-CM

## 2021-09-18 MED ORDER — LEVEMIR FLEXPEN 100 UNIT/ML ~~LOC~~ SOPN
20.0000 [IU] | PEN_INJECTOR | Freq: Every day | SUBCUTANEOUS | 11 refills | Status: DC
  Filled 2021-09-18: qty 6, 30d supply, fill #0

## 2021-09-18 MED ORDER — NOVOLOG FLEXPEN 100 UNIT/ML ~~LOC~~ SOPN
8.0000 [IU] | PEN_INJECTOR | Freq: Three times a day (TID) | SUBCUTANEOUS | 11 refills | Status: DC
  Filled 2021-09-18: qty 6, 25d supply, fill #0

## 2021-09-18 MED ORDER — PEN NEEDLES 31G X 5 MM MISC
1.0000 | Freq: Four times a day (QID) | 4 refills | Status: AC
  Filled 2021-09-18: qty 100, 25d supply, fill #0

## 2021-09-18 MED ORDER — METOPROLOL TARTRATE 25 MG PO TABS: 50.0000 mg | ORAL_TABLET | Freq: Two times a day (BID) | ORAL | 1 refills | Status: DC

## 2021-09-18 NOTE — Patient Instructions (Addendum)
Mr.Byford Lover, it was a pleasure seeing you today! You endorsed feeling well today. Below are some of the things we talked about this visit. We look forward to seeing you in the follow up appointment!  Today we discussed: For your infection: complete your antibiotic course. Take your flomax everyday.  For your diabetes: We are changing your insulin to flexpen. Inject detemir 20 units once a day and inject the aspart 8 units three times a day. Do not use the old insulin once you get the new ones.  For your blood pressure: We will stop your amlodipine and you can continue losartan and metoprolol.   I have ordered the following labs today:  Lab Orders         CBC with Diff         BMP8+Anion Gap         Lipid Profile       Referrals ordered today:   Referral Orders  No referral(s) requested today     I have ordered the following medication/changed the following medications:   Stop the following medications: Medications Discontinued During This Encounter  Medication Reason   insulin NPH Human (NOVOLIN N) 100 UNIT/ML injection Change in therapy   insulin regular (NOVOLIN R RELION) 100 units/mL injection Change in therapy   insulin aspart (NOVOLOG FLEXPEN) 100 UNIT/ML FlexPen Reorder   insulin detemir (LEVEMIR FLEXPEN) 100 UNIT/ML FlexPen Reorder   amLODipine (NORVASC) 10 MG tablet Patient has not taken in last 30 days       meloxicam (MOBIC) 15 MG tablet Patient has not taken in last 30 days   senna-docusate (SENOKOT S) 8.6-50 MG tablet Patient has not taken in last 30 days     Start the following medications: Meds ordered this encounter  Medications   insulin detemir (LEVEMIR FLEXPEN) 100 UNIT/ML FlexPen    Sig: Inject 20 Units into the skin daily.    Dispense:  15 mL    Refill:  11   insulin aspart (NOVOLOG FLEXPEN) 100 UNIT/ML FlexPen    Sig: Inject 8 Units into the skin 3 (three) times daily before meals.    Dispense:  15 mL    Refill:  11                     Follow-up: 1 week follow up  Please make sure to arrive 15 minutes prior to your next appointment. If you arrive late, you may be asked to reschedule.   We look forward to seeing you next time. Please call our clinic at [phone] if you have any questions or concerns. The best time to call is Monday-Friday from 9am-4pm, but there is someone available 24/7. If after hours or the weekend, call the main hospital number and ask for the Internal Medicine Resident On-Call. If you need medication refills, please notify your pharmacy one week in advance and they will send [place] a request.  Thank you for letting us take part in your care. Wishing you the best!  Thank you, [name], MD

## 2021-09-19 ENCOUNTER — Encounter: Payer: Medicare PPO | Admitting: Dietician

## 2021-09-19 ENCOUNTER — Ambulatory Visit (INDEPENDENT_AMBULATORY_CARE_PROVIDER_SITE_OTHER): Payer: Medicare PPO | Admitting: Dietician

## 2021-09-19 ENCOUNTER — Encounter: Payer: Self-pay | Admitting: Dietician

## 2021-09-19 DIAGNOSIS — R809 Proteinuria, unspecified: Secondary | ICD-10-CM

## 2021-09-19 DIAGNOSIS — Z794 Long term (current) use of insulin: Secondary | ICD-10-CM

## 2021-09-19 DIAGNOSIS — E1129 Type 2 diabetes mellitus with other diabetic kidney complication: Secondary | ICD-10-CM | POA: Diagnosis not present

## 2021-09-19 LAB — CBC WITH DIFFERENTIAL/PLATELET
Basophils Absolute: 0.1 10*3/uL (ref 0.0–0.2)
Basos: 1 %
EOS (ABSOLUTE): 0.2 10*3/uL (ref 0.0–0.4)
Eos: 2 %
Hematocrit: 40.6 % (ref 37.5–51.0)
Hemoglobin: 13 g/dL (ref 13.0–17.7)
Immature Grans (Abs): 0 10*3/uL (ref 0.0–0.1)
Immature Granulocytes: 0 %
Lymphocytes Absolute: 1.5 10*3/uL (ref 0.7–3.1)
Lymphs: 15 %
MCH: 29.6 pg (ref 26.6–33.0)
MCHC: 32 g/dL (ref 31.5–35.7)
MCV: 93 fL (ref 79–97)
Monocytes Absolute: 1 10*3/uL — ABNORMAL HIGH (ref 0.1–0.9)
Monocytes: 10 %
Neutrophils Absolute: 7.3 10*3/uL — ABNORMAL HIGH (ref 1.4–7.0)
Neutrophils: 72 %
Platelets: 213 10*3/uL (ref 150–450)
RBC: 4.39 x10E6/uL (ref 4.14–5.80)
RDW: 12.8 % (ref 11.6–15.4)
WBC: 10 10*3/uL (ref 3.4–10.8)

## 2021-09-19 LAB — CULTURE, BLOOD (ROUTINE X 2): Culture: NO GROWTH

## 2021-09-19 LAB — BMP8+ANION GAP
Anion Gap: 14 mmol/L (ref 10.0–18.0)
BUN/Creatinine Ratio: 14 (ref 10–24)
BUN: 16 mg/dL (ref 8–27)
CO2: 23 mmol/L (ref 20–29)
Calcium: 8.9 mg/dL (ref 8.6–10.2)
Chloride: 101 mmol/L (ref 96–106)
Creatinine, Ser: 1.11 mg/dL (ref 0.76–1.27)
Glucose: 269 mg/dL — ABNORMAL HIGH (ref 70–99)
Potassium: 4.8 mmol/L (ref 3.5–5.2)
Sodium: 138 mmol/L (ref 134–144)
eGFR: 64 mL/min/{1.73_m2} (ref >59)

## 2021-09-19 LAB — LIPID PANEL
Chol/HDL Ratio: 4.2 ratio (ref 0.0–5.0)
Cholesterol, Total: 143 mg/dL (ref 100–199)
HDL: 34 mg/dL — ABNORMAL LOW (ref >39)
LDL Chol Calc (NIH): 91 mg/dL (ref 0–99)
Triglycerides: 94 mg/dL (ref 0–149)
VLDL Cholesterol Cal: 18 mg/dL (ref 5–40)

## 2021-09-19 LAB — GLUCOSE, CAPILLARY: Glucose-Capillary: 270 mg/dL — ABNORMAL HIGH (ref 70–99)

## 2021-09-19 NOTE — Patient Instructions (Signed)
Dear Mr. Blais,  Thank you for your visit today!  You learned how to use and insulin pen today. You did a great job injecting your insulin using an insulin pen!  Please make a follow up with  me. [name] in 4-6 weeks.   Sincerely,  [name] [phone]

## 2021-09-19 NOTE — Progress Notes (Signed)
Diabetes Self-Management Education  Visit Type: Annual Follow-Up (1- daughter is present with him today)  Appt. Start Time: 1120 Appt. End Time: 1220  09/19/2021  [name], identified by name and date of birth, is a 88 y.o. male with a diagnosis of Diabetes:  .   ASSESSMENT  There were no vitals taken for this visit. There is no height or weight on file to calculate BMI.   Diabetes Self-Management Education - 09/19/21 1200       Visit Information   Visit Type Annual Follow-Up   1- daughter is present with him today     Health Coping   How would you rate your overall health? Good      Psychosocial Assessment   Patient Belief/Attitude about Diabetes Motivated to manage diabetes    What is the hardest part about your diabetes right now, causing you the most concern, or is the most worrisome to you about your diabetes?   Taking/obtaining medications;Checking blood sugar    Self-care barriers Hard of hearing;Impaired vision;Unsteady gait/risk for falls;Lack of material resources   daughter says he is color blind   Self-management support Doctor's office;Family;CDE visits    Other persons present Family Member    Patient Concerns Medication    Special Needs Simplified materials;Instruct caregiver    Preferred Learning Style Hands on    Learning Readiness Ready    How often do you need to have someone help you when you read instructions, pamphlets, or other written materials from your doctor or pharmacy? 3 - Sometimes      Pre-Education Assessment   Patient understands the diabetes disease and treatment process. Comprehends key points    Patient understands incorporating nutritional management into lifestyle. Comprehends key points    Patient undertands incorporating physical activity into lifestyle. Comprehends key points    Patient understands using medications safely. Needs Instruction    Patient understands monitoring blood glucose, interpreting and using results  Needs Review    Patient understands prevention, detection, and treatment of acute complications. Comprehends key points    Patient understands prevention, detection, and treatment of chronic complications. Compreheands key points    Patient understands how to develop strategies to address psychosocial issues. Comprehends key points    Patient understands how to develop strategies to promote health/change behavior. Comprehends key points      Complications   Last HgB A1C per patient/outside source 8.2 %    How often do you check your blood sugar? 3-4 times / week    Fasting Blood glucose range (mg/dL) >200;180-200;130-179    Postprandial Blood glucose range (mg/dL) >200;180-200    Number of hypoglycemic episodes per month 0    Number of hyperglycemic episodes ( >'200mg'$ /dL): Weekly    Can you tell when your blood sugar is high? No    Have you had a dilated eye exam in the past 12 months? No    Have you had a dental exam in the past 12 months? No    Are you checking your feet? No      Activity / Exercise   Activity / Exercise Type ADL's   in wheelchair today     Patient Education   Previous Diabetes Education Yes (please comment)   needs a lot of repetition of hands on education   Medications Taught/reviewed insulin/injectables, injection, site rotation, insulin/injectables storage and needle disposal.;Reviewed patients medication for diabetes, action, purpose, timing of dose and side effects.    Monitoring Identified appropriate SMBG and/or A1C  goals.      Individualized Goals (developed by patient)   Medications take my medication as prescribed      Patient Self-Evaluation of Goals - Patient rates self as meeting previously set goals (% of time)   Medications < 25% (hardly ever/never)      Post-Education Assessment   Patient understands using medications safely. Comphrehends key points    Patient understands monitoring blood glucose, interpreting and using results Comprehends key  points      Outcomes   Expected Outcomes Demonstrated interest in learning but significant barriers to change    Future DMSE 4-6 wks    Program Status Completed      Subsequent Visit   Since your last visit have you continued or begun to take your medications as prescribed? No   missed tkaing insulin yesterday because he does not know how to use insulin pen   Since your last visit have you had your blood pressure checked? Yes    Is your most recent blood pressure lower, unchanged, or higher since your last visit? Unchanged    Since your last visit have you experienced any weight changes? No change    Since your last visit, are you checking your blood glucose at least once a day? No             Individualized Plan for Diabetes Self-Management Training:   Learning Objective:  Patient will have a greater understanding of diabetes self-management. Patient education plan is to attend individual and/or group sessions per assessed needs and concerns.   Plan:   There are no Patient Instructions on file for this visit.  Expected Outcomes:  Demonstrated interest in learning but significant barriers to change  Education material provided: Diabetes Resources  If problems or questions, patient to contact team via:  Phone  Future DSME appointment: 4-6 wks [name], RD 09/19/2021 12:40 PM.

## 2021-09-20 ENCOUNTER — Other Ambulatory Visit: Payer: Self-pay | Admitting: Internal Medicine

## 2021-09-20 DIAGNOSIS — K219 Gastro-esophageal reflux disease without esophagitis: Secondary | ICD-10-CM

## 2021-09-20 NOTE — Assessment & Plan Note (Signed)
Lipid panel performed and LDL at 91. He is on lipitor 40 mg.  -Plan to continue lipitor at current dose.

## 2021-09-20 NOTE — Assessment & Plan Note (Signed)
Patient's DMII is uncontrolled. A1c was 8.2 4 days ago.  Home monitoring with fasting glucose ranging 160s-260s. Current regimen includes Novolin N 20 units at breakfast and Novolin R 12-15 units with lunch and dinner. Reports good adherence. Regimen was changed during hospitalization but pt is unaware.  Diabetes not at goal on current regimen, likely 2/2 poor insight resulting in dietary indiscretions. Tolerating medication without adverse effects. Counseled on the benefits of daily exercise, limiting processed foods and high sugar foods, and weight loss. No hypoglycemic episodes. Denies polydipsia, polyuria, weight loss, lethargy, blurry vision, and changes in sensation. No wounds or ulcer of bilateral feet. Benign physical exam and vitals wnl.   -Changed pt's regimen to Insulin detemir to 20 units, and aspart to 8 units TID.  -Discontinue Novolin N and Novolin R due to difficulty in injecting. Follow up in one week for adjustments. -CTM CBG's and bring glucometer to next visit  -Continue lifestyle modifications

## 2021-09-20 NOTE — Assessment & Plan Note (Addendum)
Patient was hospitalized for UTI. He is adherent to his antibiotics. He is asymptomatic. CBC was obtained that showed resolution of his leukocytosis. This is his 3rd UTI this year. He is not adherent to his flomax which I re-iterated the importance of to him and his daughter.  -Ensure compliance with Flomax.

## 2021-09-21 ENCOUNTER — Other Ambulatory Visit: Payer: Self-pay | Admitting: Internal Medicine

## 2021-09-21 DIAGNOSIS — M109 Gout, unspecified: Secondary | ICD-10-CM

## 2021-09-22 NOTE — Progress Notes (Unsigned)
Office Visit    Patient Name: [name] [name] Date of Encounter: 09/23/2021  PCP:  [name] Falcon, MD   [place]  Cardiologist:  [name] Moores, MD  Advanced Practice Provider:  No care team member to display Electrophysiologist:  None   HPI    [name] [name] is a 88 y.o. male with CAD s/p CABG 01/2013, type II diabetes mellitus, BPH, chronic systolic heart failure, hypertension, PAD, and hyperlipidemia presents today for  follow-up appointment.   He was seen 03/22/2021 and was having some mid  sternal chest pain. It was occurring after he ate food. It would resolve in about 30 minutes. He did not need a nitro. He took some Prilosec and it eventually resolved. He is chronically SOB, he had some indigestion like pains at times but not exertional. Labs were looking good.   Today, he shares that he was in the ED over the weekend with a UTI.  It was noted that he had encephalopathy.  He was on antibiotics.  Today he has not had any chest pains or shortness of breath.  He has not had any palpitations or fast heart rates.  His blood pressure is well controlled at 138/60.  He is still having some claudication with ambulation and we have set up an appointment with Dr. Alvester [name] who he has seen in the past.  He occasionally has a little bit of swelling in his left leg but today he appears euvolemic.  His weight has been pretty stable and in fact he has lost a few pounds.  Reports no shortness of breath nor dyspnea on exertion. Reports no chest pain, pressure, or tightness. No orthopnea, PND. Reports no palpitations.    Past Medical History    Past Medical History:  Diagnosis Date   Benign prostatic hyperplasia (BPH) with urinary urge incontinence 11/13/2017   Chronic left shoulder pain 11/13/2017   Chronic non-seasonal allergic rhinitis 2/97/9892   Chronic systolic heart failure ([place]) 10/31/2016   Echo (05/03/2015): LVEF 11-94%, grade 2 diastolic dysfunction    Coronary artery disease involving native coronary artery of native heart without angina pectoris 09/30/2007   s/p CABG on 01/05/2013: left internal mammary artery to left anterior descending, saphenous vein graft to obtuse marginal 1, sequential saphenous vein graft to acute marginal and posterior descending   Essential hypertension 11/04/2005   Gastroesophageal reflux disease with hiatal hernia 11/04/2005   History of prostate cancer 02/17/2006   Diagnosed 1995, s/p seed implantation in the late 1990's, PSA undetectable 04/10/2016   Hyperlipidemia 11/04/2005   Obesity (BMI 30.0-34.9) 11/13/2017   Peripheral arterial disease ([place]) 09/30/2007   with claudication, failed attempt at LLE PTCA   Type 2 diabetes mellitus with microalbuminuria, with long-term current use of insulin ([place]) 11/13/2017   Type 2 diabetes mellitus with mild 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, bilateral ([place]) 03/24/2018   Type 2 diabetes mellitus with peripheral neuropathy ([place]) 11/04/2005   Urethral stricture in male 11/13/2017   Noted on cystoscopy 12/18/2014.  Reportedly asked to do chronic intermittent self catheterizations, but has not.   Past Surgical History:  Procedure Laterality Date   CARDIAC CATHETERIZATION  10/05/08   REVEALS HYPOKINESIS OF THE LATERAL WALL AND EF 50-55%   CORONARY ANGIOPLASTY WITH STENT PLACEMENT     CORONARY ARTERY BYPASS GRAFT N/A 01/05/2013   Procedure: CORONARY ARTERY BYPASS GRAFTING (CABG);  Surgeon: [name] Nakayama, MD;  Location: [facility];  Service: Open Heart Surgery;  Laterality: N/A;  INTRAOPERATIVE TRANSESOPHAGEAL ECHOCARDIOGRAM N/A 01/05/2013   Procedure: INTRAOPERATIVE TRANSESOPHAGEAL ECHOCARDIOGRAM;  Surgeon: [name] Nakayama, MD;  Location: [place];  Service: Open Heart Surgery;  Laterality: N/A;   LEFT HEART CATHETERIZATION WITH CORONARY ANGIOGRAM N/A 01/03/2013   Procedure: LEFT HEART CATHETERIZATION WITH CORONARY ANGIOGRAM;  Surgeon: [name] Harp, MD;   Location: [facility] CATH LAB;  Service: Cardiovascular;  Laterality: N/A;   LOWER EXTREMITY ANGIOGRAM Right 12/07/2012   unsuccessful attempt at percutaneous revascularization of a calcified long segment chronic total occlusion mid left SFA /notes 12/07/2012   LOWER EXTREMITY ANGIOGRAM N/A 12/07/2012   Procedure: LOWER EXTREMITY ANGIOGRAM;  Surgeon: [name] Harp, MD;  Location: [facility] CATH LAB;  Service: Cardiovascular;  Laterality: N/A;   PROSTATE SURGERY  1990's    Allergies  No Known Allergies  EKGs/Labs/Other Studies Reviewed:   The following studies were reviewed today:  ABI 12/08/2018 Summary:  Right: Resting right ankle-brachial index indicates moderate right lower  extremity arterial disease. The right toe-brachial index is abnormal.   Left: Resting left ankle-brachial index indicates moderate left lower  extremity arterial disease. The left toe-brachial index is abnormal.   EKG:  EKG is  ordered today.  The ekg ordered today demonstrates normal sinus rhythm with PACs  Recent Labs: 09/14/2021: ALT 15; Magnesium 1.9 09/18/2021: BUN 16; Creatinine, Ser 1.11; Hemoglobin 13.0; Platelets 213; Potassium 4.8; Sodium 138  Recent Lipid Panel    Component Value Date/Time   CHOL 143 09/18/2021 1049   TRIG 94 09/18/2021 1049   HDL 34 (L) 09/18/2021 1049   CHOLHDL 4.2 09/18/2021 1049   CHOLHDL 3 08/09/2019 1407   VLDL 22.2 08/09/2019 1407   LDLCALC 91 09/18/2021 1049    Home Medications   Current Meds  Medication Sig   allopurinol (ZYLOPRIM) 100 MG tablet Take 2 tablets (200 mg total) by mouth daily.   aspirin EC 81 MG tablet Take 1 tablet (81 mg total) by mouth daily.   atorvastatin (LIPITOR) 40 MG tablet Take 1 tablet (40 mg total) by mouth daily.   colchicine 0.6 MG tablet Take 1 tablet (0.6 mg total) by mouth 2 (two) times daily as needed (gout flare).   diclofenac Sodium (VOLTAREN) 1 % GEL Apply 2 g topically 4 (four) times daily. (Patient taking differently: Apply 2 g topically  as needed (pain).)   furosemide (LASIX) 20 MG tablet Take 1 tablet (20 mg total) by mouth daily.   gabapentin (NEURONTIN) 100 MG capsule Take 2 capsules (200 mg total) by mouth 3 (three) times daily.   insulin aspart (NOVOLOG FLEXPEN) 100 UNIT/ML FlexPen Inject 8 Units into the skin 3 (three) times daily before meals.   insulin detemir (LEVEMIR FLEXPEN) 100 UNIT/ML FlexPen Inject 20 Units into the skin daily.   Insulin Pen Needle (PEN NEEDLES) 31G X 5 MM MISC Use with insulin 4 (four) times daily.   loratadine (EQ LORATADINE) 10 MG tablet TAKE 1 TABLET BY MOUTH DAILY AS NEEDED FOR RHINITIS. Strength: 10 mg   losartan (COZAAR) 50 MG tablet Take 1 tablet (50 mg total) by mouth daily.   metoprolol tartrate (LOPRESSOR) 25 MG tablet Take 2 tablets (50 mg total) by mouth 2 (two) times daily.   pantoprazole (PROTONIX) 40 MG tablet Take 1 tablet (40 mg total) by mouth 2 (two) times daily.   tamsulosin (FLOMAX) 0.4 MG CAPS capsule Take 1 capsule (0.4 mg total) by mouth daily.   trospium (SANCTURA) 20 MG tablet Take 20 mg by mouth in the morning and at bedtime.  Review of Systems      All other systems reviewed and are otherwise negative except as noted above.  Physical Exam    VS:  BP 138/60   Pulse 80   Ht '6\' 1"'$  (1.854 m)   Wt 248 lb (112.5 kg)   SpO2 99%   BMI 32.72 kg/m  , BMI Body mass index is 32.72 kg/m.  Wt Readings from Last 3 Encounters:  09/23/21 248 lb (112.5 kg)  09/18/21 255 lb 14.4 oz (116.1 kg)  09/14/21 251 lb (113.9 kg)     GEN: Well nourished, well developed, in no acute distress. HEENT: normal. Neck: Supple, no JVD, carotid bruits, or masses. Cardiac: RRR, no murmurs, rubs, or gallops. No clubbing, cyanosis, edema.  Radials/PT 2+ and equal bilaterally.  Respiratory:  Respirations regular and unlabored, clear to auscultation bilaterally. GI: Soft, nontender, nondistended. MS: No deformity or atrophy. Skin: Warm and dry, no rash. Neuro:  Strength and sensation  are intact. Psych: Normal affect.  Assessment & Plan    CAD -No recent chest pain -Continue GDMT: Aspirin 81 mg, Lipitor 40 mg, Lasix 20 mg daily, Cozaar 50 mg daily, metoprolol 25 mg twice daily  PAD -Still having claudication -ABIs were performed back in 2020 with plan to follow-up with Dr. Gwenlyn Found -We will refer back to Dr. Gwenlyn Found for further work-up  DM -He needs better control.  Last A1c was 8.2 -Defer treatment to primary care  HTN -BP well controlled today in the clinic 138/60 -He states its been well controlled on his doctor's offices but he does not take his blood pressure at home  HLD -LDL 91 which is above goal -Goal LDL less than 70 due to CAD -Work on dietary changes, may need to increase Lipitor  CHF -Euvolemic on exam -Continue current medication regimen         Disposition: Follow up 6 months with [name] Moores, MD or APP.  Signed, Elgie Collard, PA-C 09/23/2021, 4:43 PM [facility]

## 2021-09-23 ENCOUNTER — Ambulatory Visit: Payer: Medicare PPO | Admitting: Physician Assistant

## 2021-09-23 ENCOUNTER — Encounter: Payer: Self-pay | Admitting: Physician Assistant

## 2021-09-23 VITALS — BP 138/60 | HR 80 | Ht 73.0 in | Wt 248.0 lb

## 2021-09-23 DIAGNOSIS — I5020 Unspecified systolic (congestive) heart failure: Secondary | ICD-10-CM

## 2021-09-23 DIAGNOSIS — E785 Hyperlipidemia, unspecified: Secondary | ICD-10-CM

## 2021-09-23 DIAGNOSIS — E1142 Type 2 diabetes mellitus with diabetic polyneuropathy: Secondary | ICD-10-CM | POA: Diagnosis not present

## 2021-09-23 DIAGNOSIS — I5022 Chronic systolic (congestive) heart failure: Secondary | ICD-10-CM | POA: Diagnosis not present

## 2021-09-23 DIAGNOSIS — I2510 Atherosclerotic heart disease of native coronary artery without angina pectoris: Secondary | ICD-10-CM | POA: Diagnosis not present

## 2021-09-23 DIAGNOSIS — I739 Peripheral vascular disease, unspecified: Secondary | ICD-10-CM | POA: Diagnosis not present

## 2021-09-23 NOTE — Progress Notes (Addendum)
Internal Medicine Clinic Attending  Case discussed with Dr. Humphrey Rolls  At the time of the visit.  We reviewed the resident's history and exam and pertinent patient test results.  I agree with the assessment, diagnosis, and plan of care documented in the resident's note. Resident teams have been working to transition [name] [name] to insulin pens since 08/2021. While in the hospital 8/11-8/13 he was treated with long-acting insulin and sliding scale. A1c remains uncontrolled and we have again discussed transition to long-acting and short-acting insulin pens today. [name] [name] has undergone pen training with [name] and we will schedule short-term f/u for medication and BG review.  Note low initial oxygen saturation with rapid normalization, suspect spurious value.

## 2021-09-23 NOTE — Patient Instructions (Signed)
Medication Instructions:  Your physician recommends that you continue on your current medications as directed. Please refer to the Current Medication list given to you today.  *If you need a refill on your cardiac medications before your next appointment, please call your pharmacy*   Lab Work: None If you have labs (blood work) drawn today and your tests are completely normal, you will receive your results only by: [place] (if you have MyChart) OR A paper copy in the mail If you have any lab test that is abnormal or we need to change your treatment, we will call you to review the results.  Follow-Up: At [facility], you and your health needs are our priority.  As part of our continuing mission to provide you with exceptional heart care, we have created designated Provider Care Teams.  These Care Teams include your primary Cardiologist (physician) and Advanced Practice Providers (APPs -  Physician Assistants and Nurse Practitioners) who all work together to provide you with the care you need, when you need it.  We recommend signing up for the patient portal called "MyChart".  Sign up information is provided on this After Visit Summary.  MyChart is used to connect with patients for Virtual Visits (Telemedicine).  Patients are able to view lab/test results, encounter notes, upcoming appointments, etc.  Non-urgent messages can be sent to your provider as well.   To learn more about what you can do with MyChart, go to [email or web address].    Your next appointment:   Schedule an appointment to re-establish with Dr Gwenlyn Found  Then  See Dr Acie Fredrickson in January or February of 2023  Important Information About Sugar

## 2021-09-24 NOTE — Addendum Note (Signed)
Addended by: Dionicio Stall on: 09/24/2021 04:38 PM   Modules accepted: Orders

## 2021-09-25 ENCOUNTER — Ambulatory Visit (INDEPENDENT_AMBULATORY_CARE_PROVIDER_SITE_OTHER): Payer: Medicare PPO | Admitting: Student

## 2021-09-25 ENCOUNTER — Encounter: Payer: Self-pay | Admitting: Student

## 2021-09-25 DIAGNOSIS — E113293 Type 2 diabetes mellitus with mild 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, bilateral: Secondary | ICD-10-CM

## 2021-09-25 DIAGNOSIS — Z794 Long term (current) use of insulin: Secondary | ICD-10-CM

## 2021-09-25 NOTE — Assessment & Plan Note (Signed)
Patient here for 1 week follow-up after adjustment to his insulin regimen during last office visit.  During that visit, patient was switched from Novolin N and and Novolin R to Levemir and NovoLog.  States he has been following the instructions to take Levemir 20 units in the evening and NovoLog 8 units with meals.  States he has been doing this consistently and has been feeling well without any headaches, blurry vision or dizziness.  Patient states he did not know why he had this appointment and has not been checking his blood sugars because he was under the impression that he was told to stop checking his blood sugars.  Patient instructed again to check his blood sugars and instructions were written on AVS for him to check his blood sugars 3 times daily and follow-up in 2 weeks for adjustment to his insulin regimen.  Plan: -Continue Levemir 20 units daily and NovoLog 8 units 3 times daily with meals -Advised to check blood sugars 3 times daily and bring glucose meter to next appointment -Scheduled for follow-up on September 6th for download of meter and adjustment to his regimen. -Repeat A1c in 3 months

## 2021-09-25 NOTE — Patient Instructions (Addendum)
Thank you, [name] for allowing [place] to provide your care today. Today we discussed your diabetes and insulin regimen.   Please follow the instructions below:  Check your blood sugar in the morning before breakfast then at lunch and at dinner. Bring glucometer to the next visit to evaluate blood sugar levels.   My Chart Access: [email or web address]  Please follow-up in 2 weeks  Please make sure to arrive 15 minutes prior to your next appointment. If you arrive late, you may be asked to reschedule.    We look forward to seeing you next time. Please call our clinic at [phone] if you have any questions or concerns. The best time to call is Monday-Friday from 9am-4pm, but there is someone available 24/7. If after hours or the weekend, call the main hospital number and ask for the Internal Medicine Resident On-Call. If you need medication refills, please notify your pharmacy one week in advance and they will send [place] a request.   Thank you for letting us take part in your care. Wishing you the best!  Lacinda Axon, MD 09/25/2021, 10:56 AM IM Resident, PGY-3 [place]ian 41:10

## 2021-09-25 NOTE — Progress Notes (Signed)
CC: Diabetes follow-up  HPI:  [name] [name] is a 88 y.o. male with PMH as below who presents to clinic for 1 week follow-up on his diabetes. Please see problem based charting for evaluation, assessment and plan.  Past Medical History:  Diagnosis Date   Benign prostatic hyperplasia (BPH) with urinary urge incontinence 11/13/2017   Chronic left shoulder pain 11/13/2017   Chronic non-seasonal allergic rhinitis 0/76/2263   Chronic systolic heart failure ([place]) 10/31/2016   Echo (05/03/2015): LVEF 33-54%, grade 2 diastolic dysfunction   Coronary artery disease involving native coronary artery of native heart without angina pectoris 09/30/2007   s/p CABG on 01/05/2013: left internal mammary artery to left anterior descending, saphenous vein graft to obtuse marginal 1, sequential saphenous vein graft to acute marginal and posterior descending   Essential hypertension 11/04/2005   Gastroesophageal reflux disease with hiatal hernia 11/04/2005   History of prostate cancer 02/17/2006   Diagnosed 1995, s/p seed implantation in the late 1990's, PSA undetectable 04/10/2016   Hyperlipidemia 11/04/2005   Obesity (BMI 30.0-34.9) 11/13/2017   Peripheral arterial disease ([place]) 09/30/2007   with claudication, failed attempt at LLE PTCA   Type 2 diabetes mellitus with microalbuminuria, with long-term current use of insulin ([place]) 11/13/2017   Type 2 diabetes mellitus with mild 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, bilateral ([place]) 03/24/2018   Type 2 diabetes mellitus with peripheral neuropathy ([place]) 11/04/2005   Urethral stricture in male 11/13/2017   Noted on cystoscopy 12/18/2014.  Reportedly asked to do chronic intermittent self catheterizations, but has not.    Review of Systems:  Constitutional: Negative for weight gain, dizziness or fatigue Eyes: Negative for visual changes MSK: Negative for back pain Neuro: Negative for headache, numbness, tingling or weakness  Physical  Exam: General: Pleasant, appearing elderly man. No acute distress. Cardiac: RRR. No murmurs, rubs or gallops. No LE edema Respiratory: Lungs CTAB. No wheezing or crackles. Skin: Warm, dry and intact without rashes or lesions Extremities: Atraumatic. Full ROM. Palpable radial and DP pulses. Neuro: A&O x 3. Moves all extremities. Normal sensation to gross touch. Psych: Appropriate mood and affect.  Vitals:   09/25/21 1034  BP: 116/62  Pulse: 87  Temp: 97.8 F (36.6 C)  TempSrc: Oral  SpO2: 99%  Weight: 248 lb 8 oz (112.7 kg)  Height: '6\' 1"'$  (1.854 m)    Assessment & Plan:   Type 2 diabetes mellitus with mild 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, bilateral (HCC) Patient here for 1 week follow-up after adjustment to his insulin regimen during last office visit.  During that visit, patient was switched from Novolin N and and Novolin R to Levemir and NovoLog.  States he has been following the instructions to take Levemir 20 units in the evening and NovoLog 8 units with meals.  States he has been doing this consistently and has been feeling well without any headaches, blurry vision or dizziness.  Patient states he did not know why he had this appointment and has not been checking his blood sugars because he was under the impression that he was told to stop checking his blood sugars.  Patient instructed again to check his blood sugars and instructions were written on AVS for him to check his blood sugars 3 times daily and follow-up in 2 weeks for adjustment to his insulin regimen.  Plan: -Continue Levemir 20 units daily and NovoLog 8 units 3 times daily with meals -Advised to check blood sugars 3 times daily and bring glucose meter to  next appointment -Scheduled for follow-up on September 6th for download of meter and adjustment to his regimen. -Repeat A1c in 3 months    See Encounters Tab for problem based charting.  Patient discussed with Dr. Lockie Pares, MD, MPH

## 2021-09-26 NOTE — Progress Notes (Signed)
Internal Medicine Clinic Attending  Case discussed with Dr. Amponsah  At the time of the visit.  We reviewed the resident's history and exam and pertinent patient test results.  I agree with the assessment, diagnosis, and plan of care documented in the resident's note.  

## 2021-09-26 NOTE — Addendum Note (Signed)
Addended by: [name] on: 09/26/2021 03:43 PM   Modules accepted: Level of Service

## 2021-09-30 ENCOUNTER — Other Ambulatory Visit (HOSPITAL_COMMUNITY): Payer: Self-pay

## 2021-10-09 ENCOUNTER — Ambulatory Visit (INDEPENDENT_AMBULATORY_CARE_PROVIDER_SITE_OTHER): Payer: Medicare PPO | Admitting: *Deleted

## 2021-10-09 ENCOUNTER — Ambulatory Visit: Payer: Medicare PPO | Admitting: Student

## 2021-10-09 VITALS — BP 127/76 | HR 70 | Temp 98.7°F | Wt 242.6 lb

## 2021-10-09 VITALS — BP 127/76 | HR 70 | Temp 98.7°F | Wt 242.5 lb

## 2021-10-09 DIAGNOSIS — Z0000 Encounter for general adult medical examination without abnormal findings: Secondary | ICD-10-CM | POA: Diagnosis not present

## 2021-10-09 DIAGNOSIS — E113293 Type 2 diabetes mellitus with mild 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, bilateral: Secondary | ICD-10-CM | POA: Diagnosis not present

## 2021-10-09 DIAGNOSIS — R809 Proteinuria, unspecified: Secondary | ICD-10-CM

## 2021-10-09 DIAGNOSIS — Z794 Long term (current) use of insulin: Secondary | ICD-10-CM | POA: Diagnosis not present

## 2021-10-09 LAB — GLUCOSE, CAPILLARY: Glucose-Capillary: 250 mg/dL — ABNORMAL HIGH (ref 70–99)

## 2021-10-09 MED ORDER — METFORMIN HCL 500 MG PO TABS: 500.0000 mg | ORAL_TABLET | Freq: Two times a day (BID) | ORAL | 0 refills | Status: DC

## 2021-10-09 MED ORDER — EMPAGLIFLOZIN 10 MG PO TABS: 10.0000 mg | ORAL_TABLET | Freq: Every day | ORAL | 0 refills | Status: DC

## 2021-10-09 NOTE — Progress Notes (Signed)
Subjective:   [name] [name] is a 88 y.o. male who presents for an Initial Medicare Annual Wellness Visit.  Review of Systems    Defer to pcp       Objective:    There were no vitals filed for this visit. There is no height or weight on file to calculate BMI.     10/09/2021    8:48 AM 09/25/2021   10:42 AM 09/18/2021    8:58 AM 09/14/2021   12:00 PM 09/13/2021    7:01 PM 08/28/2021    2:49 PM 07/23/2021   10:40 AM  Advanced Directives  Does Patient Have a Medical Advance Directive? No No No  No No No  Would patient like information on creating a medical advance directive? No - Patient declined No - Patient declined No - Patient declined No - Patient declined  No - Patient declined Yes (MAU/Ambulatory/Procedural Areas - Information given)    Current Medications (verified) Outpatient Encounter Medications as of 10/09/2021  Medication Sig   allopurinol (ZYLOPRIM) 100 MG tablet Take 2 tablets (200 mg total) by mouth daily.   aspirin EC 81 MG tablet Take 1 tablet (81 mg total) by mouth daily.   atorvastatin (LIPITOR) 40 MG tablet Take 1 tablet (40 mg total) by mouth daily.   colchicine 0.6 MG tablet Take 1 tablet (0.6 mg total) by mouth 2 (two) times daily as needed (gout flare).   diclofenac Sodium (VOLTAREN) 1 % GEL Apply 2 g topically 4 (four) times daily. (Patient taking differently: Apply 2 g topically as needed (pain).)   furosemide (LASIX) 20 MG tablet Take 1 tablet (20 mg total) by mouth daily.   gabapentin (NEURONTIN) 100 MG capsule Take 2 capsules (200 mg total) by mouth 3 (three) times daily.   insulin aspart (NOVOLOG FLEXPEN) 100 UNIT/ML FlexPen Inject 8 Units into the skin 3 (three) times daily before meals.   insulin detemir (LEVEMIR FLEXPEN) 100 UNIT/ML FlexPen Inject 20 Units into the skin daily.   Insulin Pen Needle (PEN NEEDLES) 31G X 5 MM MISC Use with insulin 4 (four) times daily.   loratadine (EQ LORATADINE) 10 MG tablet TAKE 1 TABLET BY MOUTH DAILY AS NEEDED FOR  RHINITIS. Strength: 10 mg   losartan (COZAAR) 50 MG tablet Take 1 tablet (50 mg total) by mouth daily.   metoprolol tartrate (LOPRESSOR) 25 MG tablet Take 2 tablets (50 mg total) by mouth 2 (two) times daily.   pantoprazole (PROTONIX) 40 MG tablet Take 1 tablet (40 mg total) by mouth 2 (two) times daily.   tamsulosin (FLOMAX) 0.4 MG CAPS capsule Take 1 capsule (0.4 mg total) by mouth daily.   trospium (SANCTURA) 20 MG tablet Take 20 mg by mouth in the morning and at bedtime.   No facility-administered encounter medications on file as of 10/09/2021.    Allergies (verified) Patient has no known allergies.   History: Past Medical History:  Diagnosis Date   Benign prostatic hyperplasia (BPH) with urinary urge incontinence 11/13/2017   Chronic left shoulder pain 11/13/2017   Chronic non-seasonal allergic rhinitis 1/61/0960   Chronic systolic heart failure ([place]) 10/31/2016   Echo (05/03/2015): LVEF 45-40%, grade 2 diastolic dysfunction   Coronary artery disease involving native coronary artery of native heart without angina pectoris 09/30/2007   s/p CABG on 01/05/2013: left internal mammary artery to left anterior descending, saphenous vein graft to obtuse marginal 1, sequential saphenous vein graft to acute marginal and posterior descending   Essential hypertension 11/04/2005   Gastroesophageal  reflux disease with hiatal hernia 11/04/2005   History of prostate cancer 02/17/2006   Diagnosed 1995, s/p seed implantation in the late 1990's, PSA undetectable 04/10/2016   Hyperlipidemia 11/04/2005   Obesity (BMI 30.0-34.9) 11/13/2017   Peripheral arterial disease ([place]) 09/30/2007   with claudication, failed attempt at LLE PTCA   Type 2 diabetes mellitus with microalbuminuria, with long-term current use of insulin ([place]) 11/13/2017   Type 2 diabetes mellitus with mild 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, bilateral ([place]) 03/24/2018   Type 2 diabetes mellitus with peripheral neuropathy  ([place]) 11/04/2005   Urethral stricture in male 11/13/2017   Noted on cystoscopy 12/18/2014.  Reportedly asked to do chronic intermittent self catheterizations, but has not.   Past Surgical History:  Procedure Laterality Date   CARDIAC CATHETERIZATION  10/05/08   REVEALS HYPOKINESIS OF THE LATERAL WALL AND EF 50-55%   CORONARY ANGIOPLASTY WITH STENT PLACEMENT     CORONARY ARTERY BYPASS GRAFT N/A 01/05/2013   Procedure: CORONARY ARTERY BYPASS GRAFTING (CABG);  Surgeon: Melrose Nakayama, MD;  Location: [facility];  Service: Open Heart Surgery;  Laterality: N/A;   INTRAOPERATIVE TRANSESOPHAGEAL ECHOCARDIOGRAM N/A 01/05/2013   Procedure: INTRAOPERATIVE TRANSESOPHAGEAL ECHOCARDIOGRAM;  Surgeon: Melrose Nakayama, MD;  Location: [facility];  Service: Open Heart Surgery;  Laterality: N/A;   LEFT HEART CATHETERIZATION WITH CORONARY ANGIOGRAM N/A 01/03/2013   Procedure: LEFT HEART CATHETERIZATION WITH CORONARY ANGIOGRAM;  Surgeon: Lorretta Harp, MD;  [facility] C[facility] LAB;  Service: Cardiovascular;  Laterality: N/A;   LOWER EXTREMITY ANGIOGRAM Right 12/07/2012   unsuccessful attempt at percutaneous revascularization of a calcified long segment chronic total occlusion mid left SFA /notes 12/07/2012   LOWER EXTREMITY ANGIOGRAM N/A 12/07/2012   Procedure: LOWER EXTREMITY ANGIOGRAM;  Surgeon: Lorretta Harp, MD;  Location: [facility] CATH LAB;  Service: Cardiovascular;  Laterality: N/A;   PROSTATE SURGERY  1990's   Family History  Problem Relation Age of Onset   Kidney failure Mother    Hypertension Father    Stroke Father    Other Sister    Other Brother    Other Brother    Other Brother    Other Brother    Other Brother    Other Brother    Other Brother    Other Brother    Healthy Daughter    Healthy Daughter    Healthy Daughter    Healthy Son    Social History   Socioeconomic History   Marital status: Married    Spouse name: Not on file   Number of children: 4   Years of education: 12   Highest  education level: 12th grade  Occupational History   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  Tobacco Use   Smoking status: Former    Packs/day: 0.75    Years: 20.00    Total pack years: 15.00    Types: Cigarettes    Quit date: 07/08/1980    Years since quitting: 41.2   Smokeless tobacco: Never  Vaping Use   Vaping Use: Never used  Substance and Sexual Activity   Alcohol use: No    Alcohol/week: 0.0 standard drinks of alcohol    Comment: 12/07/2012 "quit drinking alcohol > 25 yr ago"   Drug use: No   Sexual activity: Not Currently  Other Topics Concern   Not on file  Social History Narrative   Occupation: Company secretary   Married   Social Determinants of Engineer, drilling  Resource Strain: Low Risk  (07/26/2019)   Overall Financial Resource Strain (CARDIA)    Difficulty of Paying Living Expenses: Not very hard  Food Insecurity: No Food Insecurity (07/26/2019)   Hunger Vital Sign    Worried About Running Out of Food in the Last Year: Never true    Ran Out of Food in the Last Year: Never true  Transportation Needs: No Transportation Needs (07/26/2019)   PRAPARE - Hydrologist (Medical): No    Lack of Transportation (Non-Medical): No  Physical Activity: Inactive (07/26/2019)   Exercise Vital Sign    Days of Exercise per Week: 0 days    Minutes of Exercise per Session: 0 min  Stress: No Stress Concern Present (07/26/2019)   [place]    Feeling of Stress : Not at all  Social Connections: [place] (07/26/2019)   Social Connection and Isolation Panel [NHANES]    Frequency of Communication with Friends and Family: More than three times a week    Frequency of Social Gatherings with Friends and Family: More than three times a week    Attends Religious Services: More than 4 times per year    Active Member of [employer] or Organizations: Yes    Attends Programme researcher, broadcasting/film/video: More than 4 times per year    Marital Status: Married    Tobacco Counseling Counseling given: Not Answered   Clinical Intake:                 Diabetic?Nutrition Risk Assessment:  Has the patient had any N/V/D within the last 2 months?  No  Does the patient have any non-healing wounds?  No  Has the patient had any unintentional weight loss or weight gain?  No   Diabetes:  Is the patient diabetic?  Yes  If diabetic, was a CBG obtained today?  Yes  Did the patient bring in their glucometer from home?  Yes  How often do you monitor your CBG's? daily.   Financial Strains and Diabetes Management:  Are you having any financial strains with the device, your supplies or your medication?  At times .  Does the patient want to be seen by Chronic Care Management for management of their diabetes?  No  Would the patient like to be referred to a Nutritionist or for Diabetic Management?  No   Diabetic Exams:  Diabetic Eye Exam: Completed per patient-he had eye exam "earlier in the year" at [facility] Diabetic Foot Exam: Completed 06/27/2021           Activities of Daily Living    10/09/2021    8:47 AM 09/25/2021   10:42 AM  In your present state of health, do you have any difficulty performing the following activities:  Hearing? 1 0  Vision? 1 0  Difficulty concentrating or making decisions? 1 0  Walking or climbing stairs? 0 0  Dressing or bathing? 0 0  Doing errands, shopping? 1 0    Patient Care Team: [name], MD as PCP - General (Internal Medicine) Nahser, Wonda Cheng, MD as PCP - Cardiology (Cardiology) Elayne Snare, MD (Endocrinology)  Indicate any recent Medical Services you may have received from other than [facility] providers in the past year (date may be approximate).     Assessment:   This is a routine wellness examination for [name].  Hearing/Vision screen No results found.  Dietary issues and exercise activities  discussed:  Goals Addressed   None   Depression Screen    09/25/2021   10:42 AM 09/18/2021    8:57 AM 08/28/2021    2:33 PM 07/23/2021   10:43 AM 06/21/2021    9:48 AM 03/01/2021   10:32 AM 01/11/2021   11:14 AM  PHQ 2/9 Scores  PHQ - 2 Score 0 0 0 0 0 0 0    Fall Risk    09/25/2021   10:41 AM 09/18/2021    8:57 AM 08/28/2021    2:33 PM 08/05/2021   10:44 AM 07/23/2021   10:44 AM  Fall Risk   Falls in the past year? 0 0 0 0 0  Number falls in past yr: 0 0  0 0  Injury with Fall? 0 0  0   Risk for fall due to : No Fall Risks No Fall Risks No Fall Risks No Fall Risks No Fall Risks  Follow up Falls evaluation completed;Falls prevention discussed Falls evaluation completed;Falls prevention discussed Falls evaluation completed Falls evaluation completed Falls evaluation completed    FALL RISK PREVENTION PERTAINING TO THE HOME:  Any stairs in or around the home? Yes  If so, are there any without handrails? No  Home free of loose throw rugs in walkways, pet beds, electrical cords, etc? Yes  Adequate lighting in your home to reduce risk of falls? Yes   ASSISTIVE DEVICES UTILIZED TO PREVENT FALLS:  Life alert? No  Use of a cane, walker or w/c? No  Grab bars in the bathroom? No  Shower chair or bench in shower? No  Elevated toilet seat or a handicapped toilet? No   TIMED UP AND GO:  Was the test performed? No .  Length of time to ambulate 10 feet: 0 sec.   Gait slow and steady without use of assistive device  Cognitive Function:        Immunizations Immunization History  Administered Date(s) Administered   Fluad Quad(high Dose 65+) 11/17/2019, 10/19/2020   Influenza Whole 11/15/2003, 12/11/2004, 11/13/2005, 11/04/2006, 10/19/2007, 11/08/2008, 11/13/2009   Influenza, High Dose Seasonal PF 10/25/2014, 11/11/2017   Influenza,inj,Quad PF,6+ Mos 12/08/2012, 10/18/2018   Influenza-Unspecified 10/17/2016   PFIZER(Purple Top)SARS-COV-2 Vaccination 03/31/2019, 04/20/2019,  12/19/2019   Pneumococcal Conjugate-13 02/18/2006, 08/02/2014   Pneumococcal Polysaccharide-23 02/18/2006, 01/04/2013, 02/04/2013   Td 04/25/2009   Tdap 08/18/2013    TDAP status: Up to date  Flu Vaccine status: Declined, Education has been provided regarding the importance of this vaccine but patient still declined. Advised may receive this vaccine at local pharmacy or Health Dept. Aware to provide a copy of the vaccination record if obtained from local pharmacy or Health Dept. Verbalized acceptance and understanding.  Pneumococcal vaccine status: Up to date  Covid-19 vaccine status: Declined, Education has been provided regarding the importance of this vaccine but patient still declined. Advised may receive this vaccine at local pharmacy or Health Dept.or vaccine clinic. Aware to provide a copy of the vaccination record if obtained from local pharmacy or Health Dept. Verbalized acceptance and understanding.  Qualifies for Shingles Vaccine? Yes   Zostavax completed No   Shingrix Completed?: No.    Education has been provided regarding the importance of this vaccine. Patient has been advised to call insurance company to determine out of pocket expense if they have not yet received this vaccine. Advised may also receive vaccine at local pharmacy or Health Dept. Verbalized acceptance and understanding.  Screening Tests Health Maintenance  Topic Date Due   Zoster Vaccines- Shingrix (1  of 2) Never done   COVID-19 Vaccine (4 - Pfizer risk series) 02/13/2020   INFLUENZA VACCINE  09/03/2021   HEMOGLOBIN A1C  12/15/2021   FOOT EXAM  06/28/2022   OPHTHALMOLOGY EXAM  07/06/2022   LIPID PANEL  09/19/2022   TETANUS/TDAP  08/19/2023   Pneumonia Vaccine 65+ Years old  Completed   HPV VACCINES  Aged Out    Health Maintenance  Health Maintenance Due  Topic Date Due   Zoster Vaccines- Shingrix (1 of 2) Never done   COVID-19 Vaccine (4 - Pfizer risk series) 02/13/2020   INFLUENZA VACCINE   09/03/2021    Colorectal cancer screening: No longer required.   Lung Cancer Screening: (Low Dose CT Chest recommended if Age 69-80 years, 30 pack-year currently smoking OR have quit w/in 15years.) does not qualify.   Lung Cancer Screening Referral: n/a  Additional Screening:  Hepatitis C Screening: does not qualify; Completed unknown  Vision Screening: Recommended annual ophthalmology exams for early detection of glaucoma and other disorders of the eye. Is the patient up to date with their annual eye exam?  Yes  Who is the provider or what is the name of the office in which the patient attends annual eye exams? [facility] If pt is not established with a provider, would they like to be referred to a provider to establish care?  N/a .   Dental Screening: Recommended annual dental exams for proper oral hygiene  Community Resource Referral / Chronic Care Management: CRR required this visit?  No   CCM required this visit?  No      Plan:     I have personally reviewed and noted the following in the patient's chart:   Medical and social history Use of alcohol, tobacco or illicit drugs  Current medications and supplements including opioid prescriptions. Patient is not currently taking opioid prescriptions. Functional ability and status Nutritional status Physical activity Advanced directives List of other physicians Hospitalizations, surgeries, and ER visits in previous 12 months Vitals Screenings to include cognitive, depression, and falls Referrals and appointments  In addition, I have reviewed and discussed with patient certain preventive protocols, quality metrics, and best practice recommendations. A written personalized care plan for preventive services as well as general preventive health recommendations were provided to patient.     Nicoletta Dress, [name]   10/09/2021   Nurse Notes: face to face 25 minutes  Mr. Scheff , Thank you for taking time to come for  your Medicare Wellness Visit. I appreciate your ongoing commitment to your health goals. Please review the following plan we discussed and let me know if I can assist you in the future.   These are the goals we discussed:  Goals   None     This is a list of the screening recommended for you and due dates:  Health Maintenance  Topic Date Due   Zoster (Shingles) Vaccine (1 of 2) Never done   COVID-19 Vaccine (4 - Pfizer risk series) 02/13/2020   Flu Shot  09/03/2021   Hemoglobin A1C  12/15/2021   Complete foot exam   06/28/2022   Eye exam for diabetics  07/06/2022   Lipid (cholesterol) test  09/19/2022   Tetanus Vaccine  08/19/2023   Pneumonia Vaccine  Completed   HPV Vaccine  Aged Out

## 2021-10-09 NOTE — Patient Instructions (Signed)
Health Maintenance, Male Adopting a healthy lifestyle and getting preventive care are important in promoting health and wellness. Ask your health care provider about: The right schedule for you to have regular tests and exams. Things you can do on your own to prevent diseases and keep yourself healthy. What should I know about diet, weight, and exercise? Eat a healthy diet  Eat a diet that includes plenty of vegetables, fruits, low-fat dairy products, and lean protein. Do not eat a lot of foods that are high in solid fats, added sugars, or sodium. Maintain a healthy weight Body mass index (BMI) is a measurement that can be used to identify possible weight problems. It estimates body fat based on height and weight. Your health care provider can help determine your BMI and help you achieve or maintain a healthy weight. Get regular exercise Get regular exercise. This is one of the most important things you can do for your health. Most adults should: Exercise for at least 150 minutes each week. The exercise should increase your heart rate and make you sweat (moderate-intensity exercise). Do strengthening exercises at least twice a week. This is in addition to the moderate-intensity exercise. Spend less time sitting. Even light physical activity can be beneficial. Watch cholesterol and blood lipids Have your blood tested for lipids and cholesterol at 86 years of age, then have this test every 5 years. You may need to have your cholesterol levels checked more often if: Your lipid or cholesterol levels are high. You are older than 86 years of age. You are at high risk for heart disease. What should I know about cancer screening? Many types of cancers can be detected early and may often be prevented. Depending on your health history and family history, you may need to have cancer screening at various ages. This may include screening for: Colorectal cancer. Prostate cancer. Skin cancer. Lung  cancer. What should I know about heart disease, diabetes, and high blood pressure? Blood pressure and heart disease High blood pressure causes heart disease and increases the risk of stroke. This is more likely to develop in people who have high blood pressure readings or are overweight. Talk with your health care provider about your target blood pressure readings. Have your blood pressure checked: Every 3-5 years if you are 18-39 years of age. Every year if you are 40 years old or older. If you are between the ages of 65 and 75 and are a current or former smoker, ask your health care provider if you should have a one-time screening for abdominal aortic aneurysm (AAA). Diabetes Have regular diabetes screenings. This checks your fasting blood sugar level. Have the screening done: Once every three years after age 45 if you are at a normal weight and have a low risk for diabetes. More often and at a younger age if you are overweight or have a high risk for diabetes. What should I know about preventing infection? Hepatitis B If you have a higher risk for hepatitis B, you should be screened for this virus. Talk with your health care provider to find out if you are at risk for hepatitis B infection. Hepatitis C Blood testing is recommended for: Everyone born from 1945 through 1965. Anyone with known risk factors for hepatitis C. Sexually transmitted infections (STIs) You should be screened each year for STIs, including gonorrhea and chlamydia, if: You are sexually active and are younger than 86 years of age. You are older than 86 years of age and your   health care provider tells you that you are at risk for this type of infection. Your sexual activity has changed since you were last screened, and you are at increased risk for chlamydia or gonorrhea. Ask your health care provider if you are at risk. Ask your health care provider about whether you are at high risk for HIV. Your health care provider  may recommend a prescription medicine to help prevent HIV infection. If you choose to take medicine to prevent HIV, you should first get tested for HIV. You should then be tested every 3 months for as long as you are taking the medicine. Follow these instructions at home: Alcohol use Do not drink alcohol if your health care provider tells you not to drink. If you drink alcohol: Limit how much you have to 0-2 drinks a day. Know how much alcohol is in your drink. In the [place] one drink equals one 12 oz bottle of beer (355 mL), one 5 oz glass of wine (148 mL), or one 1 oz glass of hard liquor (44 mL). Lifestyle Do not use any products that contain nicotine or tobacco. These products include cigarettes, chewing tobacco, and vaping devices, such as e-cigarettes. If you need help quitting, ask your health care provider. Do not use street drugs. Do not share needles. Ask your health care provider for help if you need support or information about quitting drugs. General instructions Schedule regular health, dental, and eye exams. Stay current with your vaccines. Tell your health care provider if: You often feel depressed. You have ever been abused or do not feel safe at home. Summary Adopting a healthy lifestyle and getting preventive care are important in promoting health and wellness. Follow your health care provider's instructions about healthy diet, exercising, and getting tested or screened for diseases. Follow your health care provider's instructions on monitoring your cholesterol and blood pressure. This information is not intended to replace advice given to you by your health care provider. Make sure you discuss any questions you have with your health care provider. Document Revised: 06/11/2020 Document Reviewed: 06/11/2020 Elsevier Patient Education  2023 [employer] Inc[employer]  

## 2021-10-09 NOTE — Patient Instructions (Addendum)
[name] [name], it was a pleasure seeing you today!  Today we discussed: - Diabetes: We discussed what diabetes is and how we check your sugars. We get a blood test called an A1c - this shows [place] what your sugars have been over the last three months! Your last A1c was 8.2% - which is not bad! We would like for you to be under 7%. You will be due to check this again in November.   - Insulin: I only want you to take ONE insulin - the green one. Take 10 units every night.  *DO NOT TAKE THE ORANGE INSULIN.  - NEW MEDICATION:  -METFORMIN '500mg'$  twice daily - this is for your diabetes   -EMPAGLIFLOZIN '10mg'$  daily - this is for your diabetes   - Your other medications:   -Allopurinol '100mg'$  daily: This medication is for gout. Take this every day.  -Amlodipine '10mg'$  daily: This medication is for blood pressure.  -Atorvastatin '40mg'$  daily: This medication is for cholesterol. Take this every day.  -Colchicine 0.'6mg'$ : This medication is for gout AS NEEDED. Take only when you have gout pain.  -Loratadine '10mg'$  daily: This medication is for allergies.  -Metoprolol tartrate '25mg'$  twice daily: This medication is for blood pressure.  -Pantoprazole '40mg'$  daily: This medication is for acid reflux.   -Tamsulosin 0.'4mg'$  daily: This is for your large prostate so that you can pee.  -Furosemide '20mg'$  daily: This is for fluid.  -Trospium '20mg'$  daily: This medication is to help you pee.   I have ordered the following medication/changed the following medications:   Stop the following medications: Medications Discontinued During This Encounter  Medication Reason   insulin aspart (NOVOLOG FLEXPEN) 100 UNIT/ML FlexPen      Start the following medications: Meds ordered this encounter  Medications   metFORMIN (GLUCOPHAGE) 500 MG tablet    Sig: Take 1 tablet (500 mg total) by mouth 2 (two) times daily with a meal.    Dispense:  60 tablet    Refill:  0   empagliflozin (JARDIANCE) 10 MG TABS tablet    Sig: Take 1  tablet (10 mg total) by mouth daily before breakfast.    Dispense:  30 tablet    Refill:  0     Follow-up:  2 weeks    Please make sure to arrive 15 minutes prior to your next appointment. If you arrive late, you may be asked to reschedule.   We look forward to seeing you next time. Please call our clinic at [phone] if you have any questions or concerns. The best time to call is Monday-Friday from 9am-4pm, but there is someone available 24/7. If after hours or the weekend, call the main hospital number and ask for the Internal Medicine Resident On-Call. If you need medication refills, please notify your pharmacy one week in advance and they will send [place] a request.  Thank you for letting us take part in your care. Wishing you the best!  Thank you, Sanjuan Dame, MD

## 2021-10-09 NOTE — Progress Notes (Signed)
CC: diabetes follow-up  HPI:  [name] [name] is a 88 y.o. person with hypertension, peripheral artery disease, heart failure with mildly reduced EF,  presenting to [facility] for diabetes follow-up.  Please see problem-based list for further details, assessments, and plans.  Past Medical History:  Diagnosis Date   Benign prostatic hyperplasia (BPH) with urinary urge incontinence 11/13/2017   Chronic left shoulder pain 11/13/2017   Chronic non-seasonal allergic rhinitis 08/13/6267   Chronic systolic heart failure ([place]) 10/31/2016   Echo (05/03/2015): LVEF 48-54%, grade 2 diastolic dysfunction   Coronary artery disease involving native coronary artery of native heart without angina pectoris 09/30/2007   s/p CABG on 01/05/2013: left internal mammary artery to left anterior descending, saphenous vein graft to obtuse marginal 1, sequential saphenous vein graft to acute marginal and posterior descending   Essential hypertension 11/04/2005   Gastroesophageal reflux disease with hiatal hernia 11/04/2005   History of prostate cancer 02/17/2006   Diagnosed 1995, s/p seed implantation in the late 1990's, PSA undetectable 04/10/2016   Hyperlipidemia 11/04/2005   Obesity (BMI 30.0-34.9) 11/13/2017   Peripheral arterial disease ([place]) 09/30/2007   with claudication, failed attempt at LLE PTCA   Type 2 diabetes mellitus with microalbuminuria, with long-term current use of insulin ([place]) 11/13/2017   Type 2 diabetes mellitus with mild 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, bilateral ([place]) 03/24/2018   Type 2 diabetes mellitus with peripheral neuropathy ([place]) 11/04/2005   Urethral stricture in male 11/13/2017   Noted on cystoscopy 12/18/2014.  Reportedly asked to do chronic intermittent self catheterizations, but has not.   Review of Systems:  As per HPI  Physical Exam:  Vitals:   10/09/21 0845  BP: 127/76  Pulse: 70  Temp: 98.7 F (37.1 C)  TempSrc: Oral  SpO2: 100%  Weight: 242  lb 9.6 oz (110 kg)   General: Resting comfortably in no acute distress Pulm: Normal work of breathing on room air. Skin: Warm, dry. No rashes or lesions appreciated.  Neuro: Awake, alert, conversing appropriately.  Psych: Normal mood, affect, speech.  Assessment & Plan:   Type 2 diabetes mellitus with mild 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, bilateral (HCC) Patient is returning to clinic this week for diabetes follow-up. He has brought his medications with him, including his insulin. Reports that Levemir gave him a headache at 20 units, so the next injection he only did 10 units. After this, he has not been using his long-acting. He does mention that he's been taking Novolog once daily in the morning, every day. When I further inquired about his medications, he was unaware of what exactly he was taking and why. He has a pill pack that his daughter helps put together each week. He denies any recent history of hypoglycemia symptoms, polyuria, polydipsia, or abdominal pain. Patient did bring his meter today. Since he was seen last, his sugars have been consistently in the 200-300's.  Patient is an 88yo with mild cognitive impairment living with his wife, who is also elderly. I am concerned about having Mr. Dubuque inject his own insulin four times daily. I think it will be most important for Mr. Martelle to be placed on a simplified regimen. He was previously on metformin, but was taken off of this due to his renal function. Per chart review, his GFR has been >50 for at least the last year. Will plan to re-start metformin and start SGLT2i. Plan to stop his short acting insulin due to MCI and risk of hypoglycemia. Will continue  with long-acting insulin but decrease to the dosage he has been taking. In the future can consider injectable GLP-1 if patient's daughter is willing to help with the injection.  - Decrease Levemir 10 units daily - STOP Novolog  - START metformin '500mg'$   twice daily - START empagliflozin '10mg'$  daily - Return to clinic in two weeks - Repeat BMP at next visit - W/ hx CKD & PAD, would not increase dosages of two oral medications  Patient discussed with Dr. Denita Lung, MD Internal Medicine PGY-3 Pager: [phone]

## 2021-10-10 MED ORDER — LEVEMIR FLEXPEN 100 UNIT/ML ~~LOC~~ SOPN: 10.0000 [IU] | PEN_INJECTOR | Freq: Every day | SUBCUTANEOUS | 2 refills | Status: DC

## 2021-10-10 NOTE — Assessment & Plan Note (Addendum)
Patient is returning to clinic this week for diabetes follow-up. He has brought his medications with him, including his insulin. Reports that Levemir gave him a headache at 20 units, so the next injection he only did 10 units. After this, he has not been using his long-acting. He does mention that he's been taking Novolog once daily in the morning, every day. When I further inquired about his medications, he was unaware of what exactly he was taking and why. He has a pill pack that his daughter helps put together each week. He denies any recent history of hypoglycemia symptoms, polyuria, polydipsia, or abdominal pain. Patient did bring his meter today. Since he was seen last, his sugars have been consistently in the 200-300's.  Patient is an 88yo with mild cognitive impairment living with his wife, who is also elderly. I am concerned about having [name] [name] inject his own insulin four times daily. I think it will be most important for [name] [name] to be placed on a simplified regimen. He was previously on metformin, but was taken off of this due to his renal function. Per chart review, his GFR has been >50 for at least the last year. Will plan to re-start metformin and start SGLT2i. Plan to stop his short acting insulin due to MCI and risk of hypoglycemia. Will continue with long-acting insulin but decrease to the dosage he has been taking. In the future can consider injectable GLP-1 if patient's daughter is willing to help with the injection.  - Decrease Levemir 10 units daily - STOP Novolog  - START metformin '500mg'$  twice daily - START empagliflozin '10mg'$  daily - Return to clinic in two weeks - Repeat BMP at next visit - W/ hx CKD & PAD, would not increase dosages of two oral medications

## 2021-10-11 NOTE — Progress Notes (Signed)
Internal Medicine Clinic Attending  Case discussed with the resident at the time of the visit.  We reviewed the resident's history and exam and pertinent patient test results.  I agree with the assessment, diagnosis, and plan of care documented in the resident's note.  

## 2021-10-21 NOTE — Progress Notes (Signed)
I reviewed the AWV findings with the provider who conducted the visit. I was present in the office suite and immediately available to provide assistance and direction throughout the time the service was provided.  

## 2021-10-25 ENCOUNTER — Encounter: Payer: Self-pay | Admitting: Student

## 2021-10-25 ENCOUNTER — Ambulatory Visit (INDEPENDENT_AMBULATORY_CARE_PROVIDER_SITE_OTHER): Payer: Medicare PPO | Admitting: Student

## 2021-10-25 ENCOUNTER — Other Ambulatory Visit: Payer: Self-pay

## 2021-10-25 VITALS — BP 139/77 | HR 88 | Temp 97.7°F | Ht 73.0 in | Wt 238.5 lb

## 2021-10-25 DIAGNOSIS — N3941 Urge incontinence: Secondary | ICD-10-CM | POA: Diagnosis not present

## 2021-10-25 DIAGNOSIS — E1129 Type 2 diabetes mellitus with other diabetic kidney complication: Secondary | ICD-10-CM | POA: Diagnosis not present

## 2021-10-25 DIAGNOSIS — N401 Enlarged prostate with lower urinary tract symptoms: Secondary | ICD-10-CM | POA: Diagnosis not present

## 2021-10-25 DIAGNOSIS — Z23 Encounter for immunization: Secondary | ICD-10-CM

## 2021-10-25 DIAGNOSIS — Z794 Long term (current) use of insulin: Secondary | ICD-10-CM | POA: Diagnosis not present

## 2021-10-25 DIAGNOSIS — Z0000 Encounter for general adult medical examination without abnormal findings: Secondary | ICD-10-CM

## 2021-10-25 DIAGNOSIS — E113293 Type 2 diabetes mellitus with mild 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, bilateral: Secondary | ICD-10-CM | POA: Diagnosis not present

## 2021-10-25 DIAGNOSIS — R809 Proteinuria, unspecified: Secondary | ICD-10-CM

## 2021-10-25 MED ORDER — TRULICITY 0.75 MG/0.5ML ~~LOC~~ SOAJ: 0.7500 mg | SUBCUTANEOUS | 0 refills | Status: DC

## 2021-10-25 NOTE — Patient Instructions (Addendum)
[name], it was a pleasure seeing you today!  Today we discussed: - Diabetes:  Please make sure to take the following medications:  -INSULIN (Levemir - green pen): 10 units in the evening -Metformin '500mg'$  twice daily -Empagliflozin (Jardiance) '10mg'$  daily  *To avoid stomach upset, eat non-greasy food with metformin.  *I would like for you to start a new medication called Trulicity (dulaglutide). This medication is an injection that you take ONCE WEEKLY. After FOUR WEEKS, we will increase your dose.   *It is important for you to exercise throughout the week.   *Please bring your glucometer to your next visit.   *The tamsulosin can make you pee more. I suggest stopping that medication for the next week to see how you feel.  I have ordered the following labs today:  Lab Orders         BMP8+Anion Gap      Follow-up:  2 weeks    Please make sure to arrive 15 minutes prior to your next appointment. If you arrive late, you may be asked to reschedule.   We look forward to seeing you next time. Please call our clinic at [phone] if you have any questions or concerns. The best time to call is Monday-Friday from 9am-4pm, but there is someone available 24/7. If after hours or the weekend, call the main hospital number and ask for the Internal Medicine Resident On-Call. If you need medication refills, please notify your pharmacy one week in advance and they will send [place] a request.  Thank you for letting us take part in your care. Wishing you the best!  Thank you, [name] Dame, MD

## 2021-10-25 NOTE — Progress Notes (Signed)
CC: diabetes follow-up  HPI:  DustinDustin [name] is a 88 y.o. person with hypertension, chronic combined systolic and diastolic heart failure, GERD, type II diabetes, mild cognitive impairment presenting to [facility] for diabetes follow-up.   Please see problem-based list for further details, assessments, and plans.  Past Medical History:  Diagnosis Date   Benign prostatic hyperplasia (BPH) with urinary urge incontinence 11/13/2017   Chronic left shoulder pain 11/13/2017   Chronic non-seasonal allergic rhinitis 2/97/9892   Chronic systolic heart failure ([place]) 10/31/2016   Echo (05/03/2015): LVEF 11-94%, grade 2 diastolic dysfunction   Coronary artery disease involving native coronary artery of native heart without angina pectoris 09/30/2007   s/p CABG on 01/05/2013: left internal mammary artery to left anterior descending, saphenous vein graft to obtuse marginal 1, sequential saphenous vein graft to acute marginal and posterior descending   Essential hypertension 11/04/2005   Gastroesophageal reflux disease with hiatal hernia 11/04/2005   History of prostate cancer 02/17/2006   Diagnosed 1995, s/p seed implantation in the late 1990's, PSA undetectable 04/10/2016   Hyperlipidemia 11/04/2005   Obesity (BMI 30.0-34.9) 11/13/2017   Peripheral arterial disease ([place]) 09/30/2007   with claudication, failed attempt at LLE PTCA   Type 2 diabetes mellitus with microalbuminuria, with long-term current use of insulin ([place]) 11/13/2017   Type 2 diabetes mellitus with mild 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, bilateral ([place]) 03/24/2018   Type 2 diabetes mellitus with peripheral neuropathy ([place]) 11/04/2005   Urethral stricture in male 11/13/2017   Noted on cystoscopy 12/18/2014.  Reportedly asked to do chronic intermittent self catheterizations, but has not.   Review of Systems:  As per HPI  Physical Exam:  Vitals:   10/25/21 0943  BP: 139/77  Pulse: 88  Temp: 97.7 F (36.5 C)   TempSrc: Oral  SpO2: 100%  Weight: 238 lb 8 oz (108.2 kg)  Height: '6\' 1"'$  (1.854 m)   General: Resting comfortably in chair, no acute distress CV: Regular rate, rhythm. No murmurs appreciated. Warm extremities. Pulm: Normal work of breathing on room air. Clear to ausculation bilaterally Neuro: Awake, alert, conversing appropriately. Grossly non-focal  Assessment & Plan:   Type 2 diabetes mellitus with mild 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, bilateral [facility]) [name] [name] is returning to clinic for a two-week diabetes follow-up. Since his last visit with me on 9/7, he reports he has been compliant with his long acting insulin, sometimes splitting it up to 5 units in the evening and 5 units in the morning. States he has been taking his metformin and Jardiance without difficulty. He has no current complaints, reports his sugars have been consistently in the 200's, lowest he has seen is 120. Denies any hypoglycemia symptoms.   Overall, I am concerned that [name] [name] cognitive impairment is a barrier to him obtaining better glycemic control. While at his age I do not believe he needs an A1c <7, I am concerned with him using any insulin. Today, with [name] [name]' permission, I called his daughter Kalman Shan. She reported similar concerns over [name] [name], stating that he will usually not allow her to help given she is a male. However, [name] [name] does allow her husband to help out. I discussed treatment options for him, including a once weekly injection. She reported that she and her husband can help make sure that this is taken properly.   Discussed with [name] [name] plan to continue on his current insulin and oral medications and add this new medication. Hopefully, we will be  able to d/c insulin and continue on once weekly GLP-1 and oral medications at his next visit. In addition, I do think it would be prudent for [name] [name] to be seen here in our geriatric clinic for a full  assessment.   - Continue Levemir 10u nightly - Continue metformin '500mg'$  twice daily - Continue Jardiance '10mg'$  daily - START Trulicity 0.'75mg'$  weekly - Continue counseling on exercise, diet - Return to clinic in two weeks  Patient discussed with Dr. Denita Lung, MD Internal Medicine PGY-3 Pager: [phone]

## 2021-10-25 NOTE — Assessment & Plan Note (Signed)
[name] [name] reports that he has been peeing frequently recently. He denies dysuria, fevers, chills, abdominal pain. States that this has happened in the past but is unsure what to do about it. He is still taking his tamsulosin.  Discussed with [name] [name] that this medication could be causing his symptoms, although his elevated sugars are also not helping. In addition, he is on a new SGLT2i since previous visit. He has no signs or symptoms of infection at this time. As this has happened in the past, will start with holding the medication. Discussed if he has difficulty urinating, fevers, chills, dysuria to call the clinic back.   - Hold tamsulosin

## 2021-10-25 NOTE — Assessment & Plan Note (Signed)
Flu vaccine given.

## 2021-10-25 NOTE — Assessment & Plan Note (Signed)
Mr. Lamson is returning to clinic for a two-week diabetes follow-up. Since his last visit with me on 9/7, he reports he has been compliant with his long acting insulin, sometimes splitting it up to 5 units in the evening and 5 units in the morning. States he has been taking his metformin and Jardiance without difficulty. He has no current complaints, reports his sugars have been consistently in the 200's, lowest he has seen is 120. Denies any hypoglycemia symptoms.   Overall, I am concerned that [name] cognitive impairment is a barrier to him obtaining better glycemic control. While at his age I do not believe he needs an A1c <7, I am concerned with him using any insulin. Today, with Mr. Welte' permission, I called his daughter Kalman Shan. She reported similar concerns over Mr. Westrup, stating that he will usually not allow her to help given she is a male. However, Mr. Bernhart does allow her husband to help out. I discussed treatment options for him, including a once weekly injection. She reported that she and her husband can help make sure that this is taken properly.   Discussed with Mr. Clutter plan to continue on his current insulin and oral medications and add this new medication. Hopefully, we will be able to d/c insulin and continue on once weekly GLP-1 and oral medications at his next visit. In addition, I do think it would be prudent for Mr. Hurwitz to be seen here in our geriatric clinic for a full assessment.   - Continue Levemir 10u nightly - Continue metformin '500mg'$  twice daily - Continue Jardiance '10mg'$  daily - START Trulicity 0.'75mg'$  weekly - Continue counseling on exercise, diet - Return to clinic in two weeks

## 2021-10-26 LAB — BMP8+ANION GAP
Anion Gap: 17 mmol/L (ref 10.0–18.0)
BUN/Creatinine Ratio: 13 (ref 10–24)
BUN: 17 mg/dL (ref 8–27)
CO2: 20 mmol/L (ref 20–29)
Calcium: 9.4 mg/dL (ref 8.6–10.2)
Chloride: 103 mmol/L (ref 96–106)
Creatinine, Ser: 1.29 mg/dL — ABNORMAL HIGH (ref 0.76–1.27)
Glucose: 249 mg/dL — ABNORMAL HIGH (ref 70–99)
Potassium: 4.1 mmol/L (ref 3.5–5.2)
Sodium: 140 mmol/L (ref 134–144)
eGFR: 53 mL/min/{1.73_m2} — ABNORMAL LOW (ref >59)

## 2021-10-29 NOTE — Progress Notes (Signed)
Internal Medicine Clinic Attending  Case discussed with the resident at the time of the visit.  We reviewed the resident's history and exam and pertinent patient test results.  I agree with the assessment, diagnosis, and plan of care documented in the resident's note.  

## 2021-11-01 ENCOUNTER — Other Ambulatory Visit: Payer: Self-pay | Admitting: Student

## 2021-11-01 DIAGNOSIS — E113293 Type 2 diabetes mellitus with mild 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, bilateral: Secondary | ICD-10-CM

## 2021-11-07 ENCOUNTER — Encounter: Payer: Self-pay | Admitting: Dietician

## 2021-11-08 ENCOUNTER — Ambulatory Visit (INDEPENDENT_AMBULATORY_CARE_PROVIDER_SITE_OTHER): Payer: Medicare PPO | Admitting: Internal Medicine

## 2021-11-08 ENCOUNTER — Other Ambulatory Visit: Payer: Self-pay

## 2021-11-08 ENCOUNTER — Encounter: Payer: Self-pay | Admitting: Internal Medicine

## 2021-11-08 VITALS — BP 137/49 | HR 53 | Temp 97.9°F | Resp 24 | Ht 73.0 in | Wt 237.2 lb

## 2021-11-08 DIAGNOSIS — E1142 Type 2 diabetes mellitus with diabetic polyneuropathy: Secondary | ICD-10-CM | POA: Diagnosis not present

## 2021-11-08 DIAGNOSIS — K5900 Constipation, unspecified: Secondary | ICD-10-CM | POA: Diagnosis not present

## 2021-11-08 DIAGNOSIS — E113293 Type 2 diabetes mellitus with mild 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, bilateral: Secondary | ICD-10-CM

## 2021-11-08 DIAGNOSIS — Z794 Long term (current) use of insulin: Secondary | ICD-10-CM | POA: Diagnosis not present

## 2021-11-08 MED ORDER — POLYETHYLENE GLYCOL 3350 17 G PO PACK: 17.0000 g | PACK | Freq: Every day | ORAL | 0 refills | Status: DC

## 2021-11-08 MED ORDER — BLOOD GLUCOSE MONITOR KIT: PACK | 0 refills | Status: AC

## 2021-11-08 NOTE — Progress Notes (Signed)
   CC: 2 week f/u  HPI:[name] is a 88 y.o. male who presents for evaluation of diabetes. Please see individual problem based A/P for details.  Feeling constipated, complaining of feet pain.   Patient has been having difficulty with understanding his dm regimen. His lancet broke so he has not been able to check blood glucose. Does reports cbg between 160-300 while he was checking. Has not been taking metformin as prescribed. Has not been takingHas been taking trulicity. Requesting endocrinologist.  Depression, PHQ-9: Based on the patients  [place] [place] Office Visit from 11/01/2020 in [place]  PHQ-9 Total Score 0      score we have .  Past Medical History:  Diagnosis Date   Benign prostatic hyperplasia (BPH) with urinary urge incontinence 11/13/2017   Chronic left shoulder pain 11/13/2017   Chronic non-seasonal allergic rhinitis 04/26/5571   Chronic systolic heart failure ([place]) 10/31/2016   Echo (05/03/2015): LVEF 22-02%, grade 2 diastolic dysfunction   Coronary artery disease involving native coronary artery of native heart without angina pectoris 09/30/2007   s/p CABG on 01/05/2013: left internal mammary artery to left anterior descending, saphenous vein graft to obtuse marginal 1, sequential saphenous vein graft to acute marginal and posterior descending   Essential hypertension 11/04/2005   Gastroesophageal reflux disease with hiatal hernia 11/04/2005   History of prostate cancer 02/17/2006   Diagnosed 1995, s/p seed implantation in the late 1990's, PSA undetectable 04/10/2016   Hyperlipidemia 11/04/2005   Obesity (BMI 30.0-34.9) 11/13/2017   Peripheral arterial disease ([place]) 09/30/2007   with claudication, failed attempt at LLE PTCA   Type 2 diabetes mellitus with microalbuminuria, with long-term current use of insulin ([place]) 11/13/2017   Type 2 diabetes mellitus with mild 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, bilateral ([place])  03/24/2018   Type 2 diabetes mellitus with peripheral neuropathy ([place]) 11/04/2005   Urethral stricture in male 11/13/2017   Noted on cystoscopy 12/18/2014.  Reportedly asked to do chronic intermittent self catheterizations, but has not.   Review of Systems:   See HPI  Physical Exam: Vitals:   11/08/21 1016  BP: (!) 137/49  Pulse: (!) 53  Resp: (!) 24  Temp: 97.9 F (36.6 C)  TempSrc: Oral  SpO2: 100%  Weight: 237 lb 3.2 oz (107.6 kg)  Height: '6\' 1"'$  (1.854 m)   General: NAD HEENT: Conjunctiva nl , antiicteric sclerae, moist mucous membranes, no exudate or erythema Cardiovascular: Normal rate, regular rhythm.  No murmurs, rubs, or gallops Pulmonary : Equal breath sounds, No wheezes, rales, or rhonchi Abdominal: soft, nontender,  bowel sounds present Ext: No edema in lower extremities, no tenderness to palpation of lower extremities.   Assessment & Plan:   See Encounters Tab for problem based charting.  Diabetes seems to be poorly controlled due to patients poor understanding of his disease and regimen. There is concern that patient's cognitive impairment is impacting his ability to manage his disease. We are working to decrease his insulin requirement. Patient would benefit from increasing his jardiacne, but he just filled Rx and does not want to waste it. Will increase his metformin to 3 tablets daily, and try to work up to 4 daily. Decrease insulin. Continue trulicity injections.   Miralax for constipation  Patient discussed with Dr. Dareen Piano

## 2021-11-08 NOTE — Patient Instructions (Addendum)
Dear [name],  Thank you for trusting [place] with your care today.   We discussed your diabetes and constipation.   - For your diabetes, please take 1 metformin pill in the morning and 2 at night.  - We are not changing your Jardiance'10mg'$ . Take 1 Jardiance pill daily. - Decrease the amount of Levemir you are using to 5 units.  - Please continue using the Trulicity injections once weekly.   Please check your blood sugars before you eat a meal.   I have sent in a new glucose meter set. You can also take your current one to the drug store to see if they are able to fix/replace it.  For your constipation, I have ordered miralax for you to take.   Please return in 2 weeks.

## 2021-11-11 ENCOUNTER — Encounter: Payer: Self-pay | Admitting: Internal Medicine

## 2021-11-11 NOTE — Assessment & Plan Note (Signed)
Complains of constipation.  miralax

## 2021-11-11 NOTE — Assessment & Plan Note (Signed)
Patient has been having difficulty with understanding his dm regimen. His lancet broke so he has not been able to check blood glucose. Does reports cbg between 160-300 while he was checking. Has not been taking metformin as prescribed. Has not been takingHas been taking trulicity. Requesting endocrinologist.  Diabetes seems to be poorly controlled due to patients poor understanding of his disease and regimen.  There is concern that patient's cognitive impairment is impacting his ability to manage his disease. We are working to decrease his insulin requirement. Patient would benefit from increasing his jardiacne, but he just filled Rx and does not want to waste it. Will increase his metformin to 3 tablets daily, and try to work up to 4 daily. Decrease insulin. Continue trulicity injections.

## 2021-11-12 NOTE — Progress Notes (Signed)
Internal Medicine Clinic Attending ° °Case discussed with Dr. Gawaluck  At the time of the visit.  We reviewed the resident’s history and exam and pertinent patient test results.  I agree with the assessment, diagnosis, and plan of care documented in the resident’s note.  °

## 2021-11-15 ENCOUNTER — Telehealth: Payer: Self-pay

## 2021-11-15 NOTE — Telephone Encounter (Signed)
Requesting to speak with a nurse about meds. Please call pt back.  

## 2021-11-15 NOTE — Telephone Encounter (Signed)
Return pt's call. He stated the doctor he saw on 10/6 was very rude. He told the doctor at the start of the visit, he is [facility]. Also stated the doctor became upset with him and "flew out of the room". And did not complete the visit. And now he's confused about what medications for diabetes he should be taking. Stated he does not want to talk nor see this doctor again. Stated the doctor did not treat him with any respect. Stated he tried to ask the doctor a questions but he was ignored. I asked if pt can come in on Monday to see [name] Plyler (no other appts available) to discuss his medications - he agreeable. Appt scheduld w/Donna Monday 10/16 @ 1345PM.

## 2021-11-18 ENCOUNTER — Ambulatory Visit (INDEPENDENT_AMBULATORY_CARE_PROVIDER_SITE_OTHER): Payer: Medicare PPO | Admitting: Dietician

## 2021-11-18 ENCOUNTER — Encounter: Payer: Self-pay | Admitting: Dietician

## 2021-11-18 DIAGNOSIS — E1129 Type 2 diabetes mellitus with other diabetic kidney complication: Secondary | ICD-10-CM | POA: Diagnosis not present

## 2021-11-18 DIAGNOSIS — Z794 Long term (current) use of insulin: Secondary | ICD-10-CM | POA: Diagnosis not present

## 2021-11-18 DIAGNOSIS — R809 Proteinuria, unspecified: Secondary | ICD-10-CM | POA: Diagnosis not present

## 2021-11-18 NOTE — Progress Notes (Signed)
Diabetes Self-Management Education  Visit Type:  Annual Follow-Up (#2 - he is alone today)  Appt. Start Time: 145 Appt. End Time: 215  11/18/2021  [name] [name], identified by name and date of birth, is a 88 y.o. male with a diagnosis of Diabetes:  .   ASSESSMENT  Patient reports taking the following diabetes medicines:   Trulictiy once weekly on Wednesday( he states he has once pen left to take this Wednesday)  Metformin 500 mg 1 pill daily Jardiance 10 mg daily (he got a 90 day fill in the past month) Levemir insulin 10 units once daily in PM. ( He brought in two pens today- one is full with 300 units the other is about half full with about 150 (15 days) left)  He checked his blood sugar on his meter today: he was able to get adequate blood to check his sugar with a microlet lancing device and 28 gauge lancets.The result was 240 mg/dL which patient asks "isn't that to high?"    I explained that increase the metformin would help reduce his blood sugar which he intends to do today. I also suggested that the doctors may want to increase his other non insulin diabetes  medicines and since he needs refill on Trulicity to schedule a follow up next week to discuss this.   He brought his meter and the trend report shows a drop in his blood sugar from 250-400 in September to 169-~255 since October 1.   Weight 238 lb 3.2 oz (108 kg). Body mass index is 31.43 kg/m. Wt Readings from Last 10 Encounters:  11/18/21 238 lb 3.2 oz (108 kg)  11/08/21 237 lb 3.2 oz (107.6 kg)  10/25/21 238 lb 8 oz (108.2 kg)  10/09/21 242 lb 8.1 oz (110 kg)  10/09/21 242 lb 9.6 oz (110 kg)  09/25/21 248 lb 8 oz (112.7 kg)  09/23/21 248 lb (112.5 kg)  09/18/21 255 lb 14.4 oz (116.1 kg)  09/14/21 251 lb (113.9 kg)  08/28/21 256 lb 14.4 oz (116.5 kg)   His weight dropped 18# (7% UBW) over the past 3 months since staring Truliicty. However, it is now is stable at new lower weight. His weight loss was/  is appropriate.   He agreed to follow up with diabetes educator in 6-8 weeks  Lab Results  Component Value Date   HGBA1C 8.2 (H) 09/14/2021   HGBA1C 8.0 (A) 06/21/2021   HGBA1C 7.6 (A) 12/14/2020   HGBA1C 8.6 (A) 09/14/2020   HGBA1C 8.2 (A) 06/22/2020        Diabetes Self-Management Education - 11/18/21 1400       Health Coping   How would you rate your overall health? Good      Subsequent Visit   Since your last visit have you continued or begun to take your medications as prescribed? Yes   all but metformin   Since your last visit have you had your blood pressure checked? Yes    Is your most recent blood pressure lower, unchanged, or higher since your last visit? Lower    Since your last visit have you experienced any weight changes? Loss    Weight Loss (lbs) 18    Since your last visit, are you checking your blood glucose at least once a day? Yes             Learning Objective:  Patient will have a greater understanding of diabetes self-management. Patient education plan is to attend individual and/or  group sessions per assessed needs and concerns.   Plan:   Patient Instructions  Today we gave you a new lancing device and lancets to help you get more blood to check your blood sugar.   Your blood sugar was 240 today  The doctors want you to increase your metformin to:  1- 1 pill with breakfast  2- 2 pills with supper  Good job lowering your blood sugars!   Please make an appointment with me in 6-8 weeks.   [name] [name]  [phone]   Expected Outcomes:     Education material provided: Diabetes Resources  If problems or questions, patient to contact team via:  Phone  Future DSME appointment: -  6-8 weeks [name], RD 11/18/2021 2:41 PM.

## 2021-11-18 NOTE — Patient Instructions (Signed)
Today we gave you a new lancing device and lancets to help you get more blood to check your blood sugar.   Your blood sugar was 240 today  The doctors want you to increase your metformin to:  1- 1 pill with breakfast  2- 2 pills with supper  Good job lowering your blood sugars!   Please make an appointment with me in 6-8 weeks.   [name]  [phone]

## 2021-11-20 ENCOUNTER — Ambulatory Visit: Payer: Medicare PPO | Attending: Cardiovascular Disease | Admitting: Cardiovascular Disease

## 2021-11-29 ENCOUNTER — Encounter: Payer: Self-pay | Admitting: Student

## 2021-11-29 ENCOUNTER — Ambulatory Visit (INDEPENDENT_AMBULATORY_CARE_PROVIDER_SITE_OTHER): Payer: Medicare PPO | Admitting: Student

## 2021-11-29 VITALS — BP 127/70 | HR 80 | Ht 73.0 in | Wt 234.2 lb

## 2021-11-29 DIAGNOSIS — E113293 Type 2 diabetes mellitus with mild 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, bilateral: Secondary | ICD-10-CM | POA: Diagnosis not present

## 2021-11-29 DIAGNOSIS — N401 Enlarged prostate with lower urinary tract symptoms: Secondary | ICD-10-CM

## 2021-11-29 DIAGNOSIS — E1129 Type 2 diabetes mellitus with other diabetic kidney complication: Secondary | ICD-10-CM

## 2021-11-29 DIAGNOSIS — N3941 Urge incontinence: Secondary | ICD-10-CM | POA: Diagnosis not present

## 2021-11-29 DIAGNOSIS — R809 Proteinuria, unspecified: Secondary | ICD-10-CM

## 2021-11-29 DIAGNOSIS — Z794 Long term (current) use of insulin: Secondary | ICD-10-CM

## 2021-11-29 MED ORDER — METFORMIN HCL 500 MG PO TABS: 1000.0000 mg | ORAL_TABLET | Freq: Two times a day (BID) | ORAL | 5 refills | Status: DC

## 2021-11-29 MED ORDER — INSULIN GLARGINE 100 UNIT/ML ~~LOC~~ SOLN: 10.0000 [IU] | Freq: Every day | SUBCUTANEOUS | 5 refills | Status: DC

## 2021-11-29 MED ORDER — LEVEMIR FLEXPEN 100 UNIT/ML ~~LOC~~ SOPN: 10.0000 [IU] | PEN_INJECTOR | Freq: Every day | SUBCUTANEOUS | 2 refills | Status: DC

## 2021-11-29 MED ORDER — TRULICITY 0.75 MG/0.5ML ~~LOC~~ SOAJ: 0.7500 mg | SUBCUTANEOUS | 0 refills | Status: DC

## 2021-11-29 NOTE — Assessment & Plan Note (Signed)
Patient endorses that since his tamsulosin was discontinued, he has been having trouble with producing a strong urine stream.  He states that he dribbles out a lot, and has urinary retention.  He states that he himself started back tamsulosin, and it has improved his symptoms.  Plan: -Continue tamsulosin

## 2021-11-29 NOTE — Progress Notes (Signed)
CC: Diabetes follow-up  HPI:  [name] [name] is a 88 y.o. male with a past medical history of type 2 diabetes presenting to the clinic for diabetes follow-up.  Please see assessment and plan for full HPI.  Past Medical History:  Diagnosis Date   Benign prostatic hyperplasia (BPH) with urinary urge incontinence 11/13/2017   Chronic left shoulder pain 11/13/2017   Chronic non-seasonal allergic rhinitis 08/03/1599   Chronic systolic heart failure ([place]) 10/31/2016   Echo (05/03/2015): LVEF 09-32%, grade 2 diastolic dysfunction   Coronary artery disease involving native coronary artery of native heart without angina pectoris 09/30/2007   s/p CABG on 01/05/2013: left internal mammary artery to left anterior descending, saphenous vein graft to obtuse marginal 1, sequential saphenous vein graft to acute marginal and posterior descending   Essential hypertension 11/04/2005   Gastroesophageal reflux disease with hiatal hernia 11/04/2005   History of prostate cancer 02/17/2006   Diagnosed 1995, s/p seed implantation in the late 1990's, PSA undetectable 04/10/2016   Hyperlipidemia 11/04/2005   Obesity (BMI 30.0-34.9) 11/13/2017   Peripheral arterial disease ([place]) 09/30/2007   with claudication, failed attempt at LLE PTCA   Type 2 diabetes mellitus with microalbuminuria, with long-term current use of insulin ([place]) 11/13/2017   Type 2 diabetes mellitus with mild 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, bilateral ([place]) 03/24/2018   Type 2 diabetes mellitus with peripheral neuropathy ([place]) 11/04/2005   Urethral stricture in male 11/13/2017   Noted on cystoscopy 12/18/2014.  Reportedly asked to do chronic intermittent self catheterizations, but has not.     Current Outpatient Medications:    insulin glargine (LANTUS) 100 UNIT/ML injection, Inject 0.1 mLs (10 Units total) into the skin at bedtime., Disp: 3 mL, Rfl: 5   allopurinol (ZYLOPRIM) 100 MG tablet, Take 2 tablets (200 mg  total) by mouth daily., Disp: 180 tablet, Rfl: 3   aspirin EC 81 MG tablet, Take 1 tablet (81 mg total) by mouth daily., Disp: , Rfl:    atorvastatin (LIPITOR) 40 MG tablet, Take 1 tablet (40 mg total) by mouth daily., Disp: 90 tablet, Rfl: 3   blood glucose meter kit and supplies KIT, Dispense based on patient and insurance preference. Use up to four times daily as directed., Disp: 1 each, Rfl: 0   colchicine 0.6 MG tablet, Take 1 tablet (0.6 mg total) by mouth 2 (two) times daily as needed (gout flare)., Disp: 60 tablet, Rfl: 1   diclofenac Sodium (VOLTAREN) 1 % GEL, Apply 2 g topically 4 (four) times daily. (Patient taking differently: Apply 2 g topically as needed (pain).), Disp: , Rfl:    Dulaglutide (TRULICITY) 3.55 DD/2.2GU SOPN, Inject 0.75 mg into the skin once a week., Disp: 2 mL, Rfl: 0   furosemide (LASIX) 20 MG tablet, Take 1 tablet (20 mg total) by mouth daily., Disp: 90 tablet, Rfl: 3   gabapentin (NEURONTIN) 100 MG capsule, Take 2 capsules (200 mg total) by mouth 3 (three) times daily., Disp: 180 capsule, Rfl: 3   JARDIANCE 10 MG TABS tablet, TAKE 1 TABLET BY MOUTH ONCE DAILY BEFORE BREAKFAST, Disp: 90 tablet, Rfl: 1   loratadine (EQ LORATADINE) 10 MG tablet, TAKE 1 TABLET BY MOUTH DAILY AS NEEDED FOR RHINITIS. Strength: 10 mg, Disp: 90 tablet, Rfl: 3   losartan (COZAAR) 50 MG tablet, Take 1 tablet (50 mg total) by mouth daily., Disp: 90 tablet, Rfl: 3   metFORMIN (GLUCOPHAGE) 500 MG tablet, Take 2 tablets (1,000 mg total) by mouth 2 (two) times daily  with a meal., Disp: 120 tablet, Rfl: 5   metoprolol tartrate (LOPRESSOR) 25 MG tablet, Take 2 tablets (50 mg total) by mouth 2 (two) times daily., Disp: 180 tablet, Rfl: 1   pantoprazole (PROTONIX) 40 MG tablet, Take 1 tablet (40 mg total) by mouth 2 (two) times daily., Disp: 180 tablet, Rfl: 3   polyethylene glycol (MIRALAX) 17 g packet, Take 17 g by mouth daily., Disp: 14 each, Rfl: 0   tamsulosin (FLOMAX) 0.4 MG CAPS capsule, Take 1  capsule (0.4 mg total) by mouth daily., Disp: 90 capsule, Rfl: 3   trospium (SANCTURA) 20 MG tablet, Take 20 mg by mouth in the morning and at bedtime., Disp: , Rfl:   Review of Systems:  GU: Patient endorses difficulty producing urine stream  Physical Exam:  Vitals:   11/29/21 0930  BP: 127/70  Pulse: 80  SpO2: 100%  Weight: 234 lb 3.2 oz (106.2 kg)  Height: _0  (1.854 m)    General: Patient is sitting comfortably in the room  Cardio: Regular rate with intermittent likely PVCs, no murmurs, rubs or gallops. 2+ pulses to bilateral upper and lower extremities  Pulmonary: Clear to ausculation bilaterally with no rales, rhonchi, and crackles    Assessment & Plan:   Type 2 diabetes mellitus with mild 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, bilateral ([place]) Patient presents to the clinic for diabetes follow-up today.  Patient ensures that he is much more comfortable taking his medication now as it has been explained to him and he has followed with [name].  Patient's current regimen includes metformin 500 mg in a.m., 3254 mg in p.m., Trulicity 9.82 mg weekly, and Levemir 10 units daily.  Patient reports that he has recently been checking his sugars after he eats and has been elevated into the 200s.  He reports polydipsia and polyuria.  His sugars have been trending down.  On exam today, patient still has intermittent PVCs.  No other concerns on exam.  Patient's biggest concerns today are that he cannot afford his medication.  Plan: -Follow-up with social work pending for financial assistance -Change Levemir to Lantus 10 units daily -Continue Trulicity 6.41 mg weekly -Increase metformin to 1000 mg in morning and 1000 mg evening -Patient was encouraged to check his fasting blood sugars in the morning -Follow-up in 1 month for A1c check  Benign prostatic hyperplasia (BPH) with urinary urge incontinence Patient endorses that since his tamsulosin was discontinued, he has  been having trouble with producing a strong urine stream.  He states that he dribbles out a lot, and has urinary retention.  He states that he himself started back tamsulosin, and it has improved his symptoms.  Plan: -Continue tamsulosin   Patient seen with Dr. Glenice Bow, DO PGY-1 Internal Medicine Resident  Pager: [phone]

## 2021-11-29 NOTE — Patient Instructions (Addendum)
[place] you for allowing me to take part in your care today.  Here are your instructions.  1.  Regarding your diabetes, please continue taking your insulin 10 units daily at bedtime.  Please continue taking Trulicity 6.76 mg every Wednesday.  Please continue taking metformin, and take 2 pills in the morning and 2 pills in the evening.  Please start checking your blood sugars first thing in the morning, before eating anything.  2.  Regarding your troubles affording these medications, I have reached out to social work, to help enroll you into a assistance program.  Please await for phone call.  Please reach out to your [employer] company, and see if they have made any headway in helping you as well.  3.  Please follow-up in 1 month for A1c check.  4.  Regarding your trouble with urination, please start taking tamsulosin again.  Thank you, Dr. Posey Pronto  If you have any other questions please contact the internal medicine clinic at [phone]

## 2021-11-29 NOTE — Assessment & Plan Note (Addendum)
Patient presents to the clinic for diabetes follow-up today.  Patient ensures that he is much more comfortable taking his medication now as it has been explained to him and he has followed with [name].  Patient's current regimen includes metformin 500 mg in a.m., 8756 mg in p.m., Trulicity 4.33 mg weekly, and Levemir 10 units daily.  Patient reports that he has recently been checking his sugars after he eats and has been elevated into the 200s.  He reports polydipsia and polyuria.  His sugars have been trending down.  On exam today, patient still has intermittent PVCs.  No other concerns on exam.  Patient's biggest concerns today are that he cannot afford his medication.  Plan: -Follow-up with social work pending for financial assistance -Change Levemir to Lantus 10 units daily -Continue Trulicity 2.95 mg weekly -Increase metformin to 1000 mg in morning and 1000 mg evening -Patient was encouraged to check his fasting blood sugars in the morning -Follow-up in 1 month for A1c check

## 2021-12-02 ENCOUNTER — Other Ambulatory Visit (INDEPENDENT_AMBULATORY_CARE_PROVIDER_SITE_OTHER): Payer: Medicare PPO

## 2021-12-02 DIAGNOSIS — E1142 Type 2 diabetes mellitus with diabetic polyneuropathy: Secondary | ICD-10-CM

## 2021-12-02 LAB — BASIC METABOLIC PANEL
BUN: 18 mg/dL (ref 6–23)
CO2: 26 mEq/L (ref 19–32)
Calcium: 9.4 mg/dL (ref 8.4–10.5)
Chloride: 104 mEq/L (ref 96–112)
Creatinine, Ser: 1.17 mg/dL (ref 0.40–1.50)
GFR: 55.79 mL/min — ABNORMAL LOW (ref >60.00)
Glucose, Bld: 193 mg/dL — ABNORMAL HIGH (ref 70–99)
Potassium: 3.8 mEq/L (ref 3.5–5.1)
Sodium: 139 mEq/L (ref 135–145)

## 2021-12-02 LAB — HEMOGLOBIN A1C: Hgb A1c MFr Bld: 9.2 % — ABNORMAL HIGH (ref 4.6–6.5)

## 2021-12-02 NOTE — Progress Notes (Signed)
Internal Medicine Clinic Attending  Case discussed with Dr. Patel at the time of the visit.  We reviewed the resident's history and exam and pertinent patient test results.  I agree with the assessment, diagnosis, and plan of care documented in the resident's note.  

## 2021-12-05 ENCOUNTER — Encounter: Payer: Self-pay | Admitting: Endocrinology

## 2021-12-05 ENCOUNTER — Ambulatory Visit: Payer: Medicare PPO | Admitting: Endocrinology

## 2021-12-05 ENCOUNTER — Telehealth: Payer: Self-pay | Admitting: *Deleted

## 2021-12-05 VITALS — BP 132/64 | HR 63 | Ht 73.0 in | Wt 235.8 lb

## 2021-12-05 DIAGNOSIS — Z794 Long term (current) use of insulin: Secondary | ICD-10-CM | POA: Diagnosis not present

## 2021-12-05 DIAGNOSIS — E1165 Type 2 diabetes mellitus with hyperglycemia: Secondary | ICD-10-CM | POA: Diagnosis not present

## 2021-12-05 DIAGNOSIS — E1142 Type 2 diabetes mellitus with diabetic polyneuropathy: Secondary | ICD-10-CM

## 2021-12-05 MED ORDER — NOVOLOG FLEXPEN 100 UNIT/ML ~~LOC~~ SOPN: PEN_INJECTOR | SUBCUTANEOUS | 1 refills | Status: DC

## 2021-12-05 NOTE — Patient Instructions (Signed)
Change Levemir to Lantus 12 Units in am  New: Novolog 8 three x daily  Do not refill Trulicity  Take Jardiance in am only

## 2021-12-05 NOTE — Progress Notes (Signed)
Patient ID: [name] [name], male   DOB: 1931/04/20, 88 y.o.   MRN: [id number]   Reason for Appointment: diabetes follow-up  History of Present Illness     DIABETES type II:  Diagnosis date: 1990  Previous history: He has been on insulin since 2005 Previously on Lantus and also subsequently basal bolus regimen but this was changed to NPH and regular insulin because of cost his A1c has been usually near 7% in the past He was switched from Lantus to NPH because of cost  Recent history:   INSULIN REGIMEN:   10 Levemir in the evening only  PREVIOUS insulin regimen: NOVOLIN-N -Relion 20 units before breakfast and 15 units supper Relion/ Regular Insulin 10 units twice daily before breakfast and dinner     Oral hypoglycemic drugs: Metformin ER 500 mg -- 2 bid   Current blood sugar patterns and problems identified:  His A1c is higher at 9.2 recently  He has been followed by the outpatient clinic and has not been seen for almost 2 years Previously was taking a total of about 55 units of insulin daily and now only taking 10 units of Levemir He also has been started on Jardiance and Trulicity Although he has lost about 40 pounds since 2021 his blood sugar control is somewhat worse He only is checking his blood sugars in the mornings and does not remember his readings, forgot his meter today However he thinks his blood sugars are higher later in the day  He is mostly eating about 3 meals a day including a sandwich at dinner His lab fasting glucose recently was 193  .       Monitors blood glucose: <1 times a day.    Glucometer:   One Touch Verio         Blood Glucose readings by recall   PRE-MEAL Fasting Lunch Dinner Bedtime Overall  Glucose range: 160?      Mean/median:        POST-MEAL PC Breakfast PC Lunch PC Dinner  Glucose range:  233   Mean/median:        Previous readings:  PRE-MEAL Fasting Lunch Dinner Bedtime Overall  Glucose range: 84-210    136-362    Mean/median:  150   190  163    Meals: 2-3 meals per day.  breakfast is eggs,oatmeal usually.   Breakfast 7 AM, lunch 1 PM and dinner 6-7                 Dietician visit: Most recent:  02/2017     Weight control:   Wt Readings from Last 3 Encounters:  12/05/21 235 lb 12.8 oz (107 kg)  11/29/21 234 lb 3.2 oz (106.2 kg)  11/18/21 238 lb 3.2 oz (108 kg)           Diabetes labs:  Lab Results  Component Value Date   HGBA1C 9.2 (H) 12/02/2021   HGBA1C 8.2 (H) 09/14/2021   HGBA1C 8.0 (A) 06/21/2021   Lab Results  Component Value Date   MICROALBUR 3.5 (H) 08/09/2019   [place] 91 09/18/2021   CREATININE 1.17 12/02/2021   No results found for: "FRUCTOSAMINE"    Allergies as of 12/05/2021   No Known Allergies      Medication List        Accurate as of December 05, 2021  1:38 PM. If you have any questions, ask your nurse or doctor.          STOP taking these  medications    insulin glargine 100 UNIT/ML injection Commonly known as: LANTUS Stopped by: Elayne Snare, MD       TAKE these medications    allopurinol 100 MG tablet Commonly known as: ZYLOPRIM Take 2 tablets (200 mg total) by mouth daily.   amLODipine 10 MG tablet Commonly known as: NORVASC Take 10 mg by mouth daily.   aspirin EC 81 MG tablet Take 1 tablet (81 mg total) by mouth daily.   atorvastatin 40 MG tablet Commonly known as: LIPITOR Take 1 tablet (40 mg total) by mouth daily.   blood glucose meter kit and supplies Kit Dispense based on patient and insurance preference. Use up to four times daily as directed.   colchicine 0.6 MG tablet Take 1 tablet (0.6 mg total) by mouth 2 (two) times daily as needed (gout flare).   diclofenac Sodium 1 % Gel Commonly known as: Voltaren Apply 2 g topically 4 (four) times daily. What changed:  when to take this reasons to take this   furosemide 20 MG tablet Commonly known as: LASIX Take 1 tablet (20 mg total) by mouth daily.    gabapentin 100 MG capsule Commonly known as: NEURONTIN Take 2 capsules (200 mg total) by mouth 3 (three) times daily.   Jardiance 10 MG Tabs tablet Generic drug: empagliflozin TAKE 1 TABLET BY MOUTH ONCE DAILY BEFORE BREAKFAST   Levemir 100 UNIT/ML injection Generic drug: insulin detemir Inject 10 Units into the skin daily. 10 units in evening   loratadine 10 MG tablet Commonly known as: EQ Loratadine TAKE 1 TABLET BY MOUTH DAILY AS NEEDED FOR RHINITIS. Strength: 10 mg   losartan 50 MG tablet Commonly known as: COZAAR Take 1 tablet (50 mg total) by mouth daily.   metFORMIN 500 MG tablet Commonly known as: Glucophage Take 2 tablets (1,000 mg total) by mouth 2 (two) times daily with a meal.   metoprolol tartrate 25 MG tablet Commonly known as: LOPRESSOR Take 2 tablets (50 mg total) by mouth 2 (two) times daily.   NovoLOG FlexPen 100 UNIT/ML FlexPen Generic drug: insulin aspart 8 units before meals Started by: Elayne Snare, MD   pantoprazole 40 MG tablet Commonly known as: PROTONIX Take 1 tablet (40 mg total) by mouth 2 (two) times daily.   polyethylene glycol 17 g packet Commonly known as: MiraLax Take 17 g by mouth daily.   tamsulosin 0.4 MG Caps capsule Commonly known as: FLOMAX Take 1 capsule (0.4 mg total) by mouth daily.   trospium 20 MG tablet Commonly known as: SANCTURA Take 20 mg by mouth in the morning and at bedtime.   Trulicity 0.27 OZ/3.6UY Sopn Generic drug: Dulaglutide Inject 0.75 mg into the skin once a week.        Allergies: No Known Allergies  Past Medical History:  Diagnosis Date   Benign prostatic hyperplasia (BPH) with urinary urge incontinence 11/13/2017   Chronic left shoulder pain 11/13/2017   Chronic non-seasonal allergic rhinitis 05/06/4740   Chronic systolic heart failure ([place]) 10/31/2016   Echo (05/03/2015): LVEF 59-56%, grade 2 diastolic dysfunction   Coronary artery disease involving native coronary artery of native heart  without angina pectoris 09/30/2007   s/p CABG on 01/05/2013: left internal mammary artery to left anterior descending, saphenous vein graft to obtuse marginal 1, sequential saphenous vein graft to acute marginal and posterior descending   Essential hypertension 11/04/2005   Gastroesophageal reflux disease with hiatal hernia 11/04/2005   History of prostate cancer 02/17/2006   Diagnosed 1995, s/p  seed implantation in the late 1990's, PSA undetectable 04/10/2016   Hyperlipidemia 11/04/2005   Obesity (BMI 30.0-34.9) 11/13/2017   Peripheral arterial disease ([place]) 09/30/2007   with claudication, failed attempt at LLE PTCA   Type 2 diabetes mellitus with microalbuminuria, with long-term current use of insulin ([place]) 11/13/2017   Type 2 diabetes mellitus with mild 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, bilateral ([place]) 03/24/2018   Type 2 diabetes mellitus with peripheral neuropathy ([place]) 11/04/2005   Urethral stricture in male 11/13/2017   Noted on cystoscopy 12/18/2014.  Reportedly asked to do chronic intermittent self catheterizations, but has not.    Past Surgical History:  Procedure Laterality Date   CARDIAC CATHETERIZATION  10/05/08   REVEALS HYPOKINESIS OF THE LATERAL WALL AND EF 50-55%   CORONARY ANGIOPLASTY WITH STENT PLACEMENT     CORONARY ARTERY BYPASS GRAFT N/A 01/05/2013   Procedure: CORONARY ARTERY BYPASS GRAFTING (CABG);  Surgeon: Melrose Nakayama, MD;  Location: [place];  Service: Open Heart Surgery;  Laterality: N/A;   INTRAOPERATIVE TRANSESOPHAGEAL ECHOCARDIOGRAM N/A 01/05/2013   Procedure: INTRAOPERATIVE TRANSESOPHAGEAL ECHOCARDIOGRAM;  Surgeon: Melrose Nakayama, MD;  Location: [place];  Service: Open Heart Surgery;  Laterality: N/A;   LEFT HEART CATHETERIZATION WITH CORONARY ANGIOGRAM N/A 01/03/2013   Procedure: LEFT HEART CATHETERIZATION WITH CORONARY ANGIOGRAM;  Surgeon: Lorretta Harp, MD;  Location: [facility] CATH LAB;  Service: Cardiovascular;  Laterality: N/A;    LOWER EXTREMITY ANGIOGRAM Right 12/07/2012   unsuccessful attempt at percutaneous revascularization of a calcified long segment chronic total occlusion mid left SFA /notes 12/07/2012   LOWER EXTREMITY ANGIOGRAM N/A 12/07/2012   Procedure: LOWER EXTREMITY ANGIOGRAM;  Surgeon: Lorretta Harp, MD;  Location: [facility] CATH LAB;  Service: Cardiovascular;  Laterality: N/A;   PROSTATE SURGERY  1990's    Family History  Problem Relation Age of Onset   Kidney failure Mother    Hypertension Father    Stroke Father    Other Sister    Other Brother    Other Brother    Other Brother    Other Brother    Other Brother    Other Brother    Other Brother    Other Brother    Healthy Daughter    Healthy Daughter    Healthy Daughter    Healthy Son     Social History:  reports that he quit smoking about 41 years ago. His smoking use included cigarettes. He has a 15.00 pack-year smoking history. He has never used smokeless tobacco. He reports that he does not drink alcohol and does not use drugs.  Review of Systems:   Hypertension:  has had long-standing hypertension followed by his PCP and other physicians Blood pressure recently well controlled  BP Readings from Last 3 Encounters:  12/05/21 132/64  11/29/21 127/70  11/08/21 (!) 137/49     Lipids: Has been treated with Lipitor 40 mg Usually has lipid levels below target   Lab Results  Component Value Date   CHOL 143 09/18/2021   HDL 34 (L) 09/18/2021   LDLCALC 91 09/18/2021   TRIG 94 09/18/2021   CHOLHDL 4.2 09/18/2021    Neuropathy: Has been treated with gabapentin 100 mg by his PCP  Claudication: Has pain in the lower legs when he is walking, more on the left calf, followed by cardiologist     Foot exam done in 5/23, has absent pedal pulses otherwise normal monofilament sensation   RENAL   He does not have microalbuminuria  Lab Results  Component Value Date   CREATININE 1.17 12/02/2021   CREATININE 1.29 (H) 10/25/2021    CREATININE 1.11 09/18/2021       LABS:  Lab on 12/02/2021  Component Date Value Ref Range Status   Sodium 12/02/2021 139  135 - 145 mEq/L Final   Potassium 12/02/2021 3.8  3.5 - 5.1 mEq/L Final   Chloride 12/02/2021 104  96 - 112 mEq/L Final   CO2 12/02/2021 26  19 - 32 mEq/L Final   Glucose, Bld 12/02/2021 193 (H)  70 - 99 mg/dL Final   BUN 12/02/2021 18  6 - 23 mg/dL Final   Creatinine, Ser 12/02/2021 1.17  0.40 - 1.50 mg/dL Final   GFR 12/02/2021 55.79 (L)  >60.00 mL/min Final   Calculated using the CKD-EPI Creatinine Equation (2021)   Calcium 12/02/2021 9.4  8.4 - 10.5 mg/dL Final   Hgb A1c MFr Bld 12/02/2021 9.2 (H)  4.6 - 6.5 % Final   Glycemic Control Guidelines for People with Diabetes:Non Diabetic:  <6%Goal of Therapy: <7%Additional Action Suggested:  >8%      Examination:   BP 132/64   Pulse 63   Ht _0  (1.854 m)   Wt 235 lb 12.8 oz (107 kg)   SpO2 98%   BMI 31.11 kg/m   Body mass index is 31.11 kg/m.     ASSESSMENT/ PLAN:     Diabetes type 2 insulin-dependent:   His A1c is now about the highest he has had in a while at 9.2, on his last visit was at 8.8 Has not been seen since 12/21  See history of present illness for detailed discussion of current diabetes management, blood sugar patterns and problems identified  He was on NPH and regular insulin totaling 55 units/day with Metformin Currently he is on a multidrug regimen of metformin, Jardiance, Trulicity and only 10 units of Levemir  He has inadequate monitoring and difficult to know what his blood sugar patterns are especially after meals Considering his long history of diabetes he is likely insulin deficient and is not responding well to other agents Discussed that he needs to get back on basal bolus insulin regimen especially mealtime insulin  Recommendations:  We will be given prescription for a CGM with freestyle libre 3 and discussed in detail the use of the continuous sensor, benefits from  this and alerts for high and low sugars, prescription will be sent through [employer] He will let us know when he needs help starting this He will take 8 units of NOVOLOG insulin at each meal to start with Discussed that his blood sugars should be under 180 or so after meals  Today discussed in detail the need for mealtime insulin to cover postprandial spikes, action of mealtime insulin, use of the insulin pen, timing and action of the rapid acting insulin as well as starting dose and dosage titration to target the two-hour reading of under 180  He likely needs significant amount of diabetes education, REFERRAL made His insulin regimen was reviewed in detail several times and written instructions given including on the graph of mealtime hyperglycemia BASAL insulin: Discussed actions of basal insulin and since he likely can get more 24 control with Lantus and better action and he will switch to that in the morning starting with 12 units of but likely will need to increase it based on his fasting readings Unlikely that he will benefit from other drugs and can hold off on Trulicity refill unless he starts gaining a  lot of weight Also he can take Jardiance in the morning and not twice a day, may reduce his frequency of urination and enuresis also  HYPERTENSION: Blood pressure is controlled  Neuropathy: Needs regular foot exam      Patient Instructions  Change Levemir to Lantus 12 Units in am  New: Novolog 8 three x daily  Do not refill Trulicity  Take Jardiance in am only     Total visit time for evaluation and management, review of all relevant records, counseling = 40 minutes  Elayne Snare 12/05/2021, 1:38 PM

## 2021-12-05 NOTE — Telephone Encounter (Signed)
Call from patient's daughter abut concerns about the amount of Insulins patient is taking.   Patient is trying to mix the 2 types of Insulins 1 in a pen and the other from a vial.  Stated patient is not taking the 3 times a day Insulin.  When asked stated that patient does have a Diabetes doctor.  When reviewing chart patient is currently at Dr. [name] office for a visit this morning.  Daughter was given number to DrMarland Kitchen [name] office to call an to discuss her concerns.

## 2021-12-08 NOTE — Telephone Encounter (Signed)
Patient and family need to decide if Endocrine or [place] are going to manage insulin.  Confusion is coming from recommendations from too many providers.

## 2021-12-10 ENCOUNTER — Other Ambulatory Visit: Payer: Self-pay | Admitting: Student

## 2021-12-10 DIAGNOSIS — M109 Gout, unspecified: Secondary | ICD-10-CM

## 2021-12-13 ENCOUNTER — Encounter: Payer: Medicare PPO | Admitting: Internal Medicine

## 2021-12-13 DIAGNOSIS — Z0000 Encounter for general adult medical examination without abnormal findings: Secondary | ICD-10-CM | POA: Diagnosis not present

## 2021-12-13 DIAGNOSIS — E669 Obesity, unspecified: Secondary | ICD-10-CM | POA: Diagnosis not present

## 2021-12-13 DIAGNOSIS — N3943 Post-void dribbling: Secondary | ICD-10-CM | POA: Diagnosis not present

## 2021-12-13 DIAGNOSIS — E785 Hyperlipidemia, unspecified: Secondary | ICD-10-CM | POA: Diagnosis not present

## 2021-12-13 DIAGNOSIS — E1140 Type 2 diabetes mellitus with diabetic neuropathy, unspecified: Secondary | ICD-10-CM | POA: Diagnosis not present

## 2021-12-13 DIAGNOSIS — I130 Hypertensive heart and chronic kidney disease with heart failure and stage 1 through stage 4 chronic kidney disease, or unspecified chronic kidney disease: Secondary | ICD-10-CM | POA: Diagnosis not present

## 2021-12-13 DIAGNOSIS — N401 Enlarged prostate with lower urinary tract symptoms: Secondary | ICD-10-CM | POA: Diagnosis not present

## 2021-12-13 DIAGNOSIS — I5022 Chronic systolic (congestive) heart failure: Secondary | ICD-10-CM | POA: Diagnosis not present

## 2021-12-13 DIAGNOSIS — H9190 Unspecified hearing loss, unspecified ear: Secondary | ICD-10-CM | POA: Diagnosis not present

## 2021-12-27 DIAGNOSIS — N3943 Post-void dribbling: Secondary | ICD-10-CM | POA: Diagnosis not present

## 2021-12-27 DIAGNOSIS — N401 Enlarged prostate with lower urinary tract symptoms: Secondary | ICD-10-CM | POA: Diagnosis not present

## 2021-12-27 DIAGNOSIS — I2510 Atherosclerotic heart disease of native coronary artery without angina pectoris: Secondary | ICD-10-CM | POA: Diagnosis not present

## 2021-12-27 DIAGNOSIS — E785 Hyperlipidemia, unspecified: Secondary | ICD-10-CM | POA: Diagnosis not present

## 2021-12-27 DIAGNOSIS — I130 Hypertensive heart and chronic kidney disease with heart failure and stage 1 through stage 4 chronic kidney disease, or unspecified chronic kidney disease: Secondary | ICD-10-CM | POA: Diagnosis not present

## 2021-12-27 DIAGNOSIS — H9190 Unspecified hearing loss, unspecified ear: Secondary | ICD-10-CM | POA: Diagnosis not present

## 2021-12-27 DIAGNOSIS — E1140 Type 2 diabetes mellitus with diabetic neuropathy, unspecified: Secondary | ICD-10-CM | POA: Diagnosis not present

## 2021-12-27 DIAGNOSIS — E1151 Type 2 diabetes mellitus with diabetic peripheral angiopathy without gangrene: Secondary | ICD-10-CM | POA: Diagnosis not present

## 2021-12-27 DIAGNOSIS — E669 Obesity, unspecified: Secondary | ICD-10-CM | POA: Diagnosis not present

## 2022-01-02 DIAGNOSIS — N401 Enlarged prostate with lower urinary tract symptoms: Secondary | ICD-10-CM | POA: Diagnosis not present

## 2022-01-02 DIAGNOSIS — N3943 Post-void dribbling: Secondary | ICD-10-CM | POA: Diagnosis not present

## 2022-01-03 ENCOUNTER — Encounter: Payer: Medicare PPO | Admitting: Internal Medicine

## 2022-01-16 ENCOUNTER — Telehealth: Payer: Self-pay

## 2022-01-16 NOTE — Telephone Encounter (Signed)
[facility] requested chart notes. Chart notes sent to [phone]

## 2022-02-04 ENCOUNTER — Other Ambulatory Visit (INDEPENDENT_AMBULATORY_CARE_PROVIDER_SITE_OTHER): Payer: Medicare Other

## 2022-02-04 DIAGNOSIS — E1165 Type 2 diabetes mellitus with hyperglycemia: Secondary | ICD-10-CM | POA: Diagnosis not present

## 2022-02-04 DIAGNOSIS — Z794 Long term (current) use of insulin: Secondary | ICD-10-CM

## 2022-02-04 LAB — BASIC METABOLIC PANEL
BUN: 20 mg/dL (ref 6–23)
CO2: 24 mEq/L (ref 19–32)
Calcium: 9.1 mg/dL (ref 8.4–10.5)
Chloride: 103 mEq/L (ref 96–112)
Creatinine, Ser: 1.32 mg/dL (ref 0.40–1.50)
GFR: 48.21 mL/min — ABNORMAL LOW (ref >60.00)
Glucose, Bld: 205 mg/dL — ABNORMAL HIGH (ref 70–99)
Potassium: 4.4 mEq/L (ref 3.5–5.1)
Sodium: 137 mEq/L (ref 135–145)

## 2022-02-04 LAB — HEMOGLOBIN A1C: Hgb A1c MFr Bld: 7.9 % — ABNORMAL HIGH (ref 4.6–6.5)

## 2022-02-04 LAB — MICROALBUMIN / CREATININE URINE RATIO
Creatinine,U: 85.7 mg/dL
Microalb Creat Ratio: 5.4 mg/g (ref 0.0–30.0)
Microalb, Ur: 4.7 mg/dL — ABNORMAL HIGH (ref 0.0–1.9)

## 2022-02-05 ENCOUNTER — Other Ambulatory Visit: Payer: Medicare PPO

## 2022-02-07 ENCOUNTER — Encounter: Payer: Medicare PPO | Admitting: Internal Medicine

## 2022-02-11 ENCOUNTER — Ambulatory Visit: Payer: Medicare Other | Admitting: Endocrinology

## 2022-02-11 ENCOUNTER — Encounter: Payer: Self-pay | Admitting: Endocrinology

## 2022-02-11 VITALS — BP 114/58 | HR 77 | Ht 73.0 in | Wt 243.0 lb

## 2022-02-11 DIAGNOSIS — Z794 Long term (current) use of insulin: Secondary | ICD-10-CM

## 2022-02-11 DIAGNOSIS — E1165 Type 2 diabetes mellitus with hyperglycemia: Secondary | ICD-10-CM

## 2022-02-11 MED ORDER — NOVOLOG FLEXPEN 100 UNIT/ML ~~LOC~~ SOPN: PEN_INJECTOR | SUBCUTANEOUS | 1 refills | Status: DC

## 2022-02-11 NOTE — Progress Notes (Signed)
Patient ID: [name] [name], male   DOB: Sep 09, 1931, 89 y.o.   MRN: [id number]   Reason for Appointment: diabetes follow-up  History of Present Illness     DIABETES type II:  Diagnosis date: 1990  Previous history: He has been on insulin since 2005 Previously on Lantus and also subsequently basal bolus regimen but this was changed to NPH and regular insulin because of cost his A1c has been usually near 7% in the past He was switched from Lantus to NPH because of cost  Recent history:   INSULIN REGIMEN: Novolog 8 units 3 times daily, Lantus 12 units daily Oral hypoglycemic drugs: Metformin ER 500 mg -- 2 bid   Current blood sugar patterns and problems identified:  His A1c is better at 7.9, previously was at 9.2   He has been started on basal bolus insulin regimen with Lantus and NovoLog on his last visit However he has apparently difficulty checking his blood sugars with the fingerstick and does not know what his blood sugars are  Also not clear why he was not getting the freestyle libre sensor that was recommended on the last visit, he thinks he was called by a vendor but he did not get the supplies No hypoglycemic symptoms reported He is generally eating about 3 meals a day including a sandwich at dinner He confirms that he is trying to take his NovoLog before meals with the insulin pen His lab glucose after lunch was 205 Weight is slightly higher  .       Monitors blood glucose: <1 times a day.    Glucometer:   One Touch Verio         Blood Glucose readings not available    Meals: 2-3 meals per day.  breakfast is eggs,oatmeal usually.   Breakfast 7 AM, lunch 1 PM and dinner 6-7                 Dietician visit: Most recent:  02/2017     Weight control:   Wt Readings from Last 3 Encounters:  02/11/22 243 lb (110.2 kg)  12/05/21 235 lb 12.8 oz (107 kg)  11/29/21 234 lb 3.2 oz (106.2 kg)           Diabetes labs:  Lab Results  Component Value Date    HGBA1C 7.9 (H) 02/04/2022   HGBA1C 9.2 (H) 12/02/2021   HGBA1C 8.2 (H) 09/14/2021   Lab Results  Component Value Date   MICROALBUR 4.7 (H) 02/04/2022   LDLCALC 91 09/18/2021   CREATININE 1.32 02/04/2022   No results found for: "FRUCTOSAMINE"    Allergies as of 02/11/2022   No Known Allergies      Medication List        Accurate as of February 11, 2022  4:26 PM. If you have any questions, ask your nurse or doctor.          STOP taking these medications    Levemir 100 UNIT/ML injection Generic drug: insulin detemir Stopped by: Elayne Snare, MD       TAKE these medications    allopurinol 100 MG tablet Commonly known as: ZYLOPRIM Take 2 tablets by mouth once daily   amLODipine 10 MG tablet Commonly known as: NORVASC Take 10 mg by mouth daily.   aspirin EC 81 MG tablet Take 1 tablet (81 mg total) by mouth daily.   atorvastatin 40 MG tablet Commonly known as: LIPITOR Take 1 tablet (40 mg total) by mouth daily.  blood glucose meter kit and supplies Kit Dispense based on patient and insurance preference. Use up to four times daily as directed.   colchicine 0.6 MG tablet Take 1 tablet (0.6 mg total) by mouth 2 (two) times daily as needed (gout flare).   diclofenac Sodium 1 % Gel Commonly known as: Voltaren Apply 2 g topically 4 (four) times daily. What changed:  when to take this reasons to take this   furosemide 20 MG tablet Commonly known as: LASIX Take 1 tablet (20 mg total) by mouth daily.   gabapentin 100 MG capsule Commonly known as: NEURONTIN Take 2 capsules (200 mg total) by mouth 3 (three) times daily.   Jardiance 10 MG Tabs tablet Generic drug: empagliflozin TAKE 1 TABLET BY MOUTH ONCE DAILY BEFORE BREAKFAST   Lantus 100 UNIT/ML injection Generic drug: insulin glargine Inject 11 Units into the skin daily.   loratadine 10 MG tablet Commonly known as: EQ Loratadine TAKE 1 TABLET BY MOUTH DAILY AS NEEDED FOR RHINITIS. Strength: 10 mg    losartan 50 MG tablet Commonly known as: COZAAR Take 1 tablet (50 mg total) by mouth daily.   metFORMIN 500 MG tablet Commonly known as: Glucophage Take 2 tablets (1,000 mg total) by mouth 2 (two) times daily with a meal.   metoprolol tartrate 25 MG tablet Commonly known as: LOPRESSOR Take 2 tablets (50 mg total) by mouth 2 (two) times daily.   NovoLOG FlexPen 100 UNIT/ML FlexPen Generic drug: insulin aspart 8-10 units before meals What changed: additional instructions Changed by: Elayne Snare, MD   pantoprazole 40 MG tablet Commonly known as: PROTONIX Take 1 tablet (40 mg total) by mouth 2 (two) times daily.   polyethylene glycol 17 g packet Commonly known as: MiraLax Take 17 g by mouth daily.   tamsulosin 0.4 MG Caps capsule Commonly known as: FLOMAX Take 1 capsule (0.4 mg total) by mouth daily.   trospium 20 MG tablet Commonly known as: SANCTURA Take 20 mg by mouth in the morning and at bedtime.   Trulicity 2.75 TZ/0.0FV Sopn Generic drug: Dulaglutide Inject 0.75 mg into the skin once a week.        Allergies: No Known Allergies  Past Medical History:  Diagnosis Date   Benign prostatic hyperplasia (BPH) with urinary urge incontinence 11/13/2017   Chronic left shoulder pain 11/13/2017   Chronic non-seasonal allergic rhinitis 4/94/4967   Chronic systolic heart failure ([place]) 10/31/2016   Echo (05/03/2015): LVEF 59-16%, grade 2 diastolic dysfunction   Coronary artery disease involving native coronary artery of native heart without angina pectoris 09/30/2007   s/p CABG on 01/05/2013: left internal mammary artery to left anterior descending, saphenous vein graft to obtuse marginal 1, sequential saphenous vein graft to acute marginal and posterior descending   Essential hypertension 11/04/2005   Gastroesophageal reflux disease with hiatal hernia 11/04/2005   History of prostate cancer 02/17/2006   Diagnosed 1995, s/p seed implantation in the late 1990's, PSA undetectable  04/10/2016   Hyperlipidemia 11/04/2005   Obesity (BMI 30.0-34.9) 11/13/2017   Peripheral arterial disease ([place]) 09/30/2007   with claudication, failed attempt at LLE PTCA   Type 2 diabetes mellitus with microalbuminuria, with long-term current use of insulin ([place]) 11/13/2017   Type 2 diabetes mellitus with mild 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, bilateral ([place]) 03/24/2018   Type 2 diabetes mellitus with peripheral neuropathy ([place]) 11/04/2005   Urethral stricture in male 11/13/2017   Noted on cystoscopy 12/18/2014.  Reportedly asked to do chronic intermittent self  catheterizations, but has not.    Past Surgical History:  Procedure Laterality Date   CARDIAC CATHETERIZATION  10/05/08   REVEALS HYPOKINESIS OF THE LATERAL WALL AND EF 50-55%   CORONARY ANGIOPLASTY WITH STENT PLACEMENT     CORONARY ARTERY BYPASS GRAFT N/A 01/05/2013   Procedure: CORONARY ARTERY BYPASS GRAFTING (CABG);  Surgeon: Melrose Nakayama, MD;  Location: [place];  Service: Open Heart Surgery;  Laterality: N/A;   INTRAOPERATIVE TRANSESOPHAGEAL ECHOCARDIOGRAM N/A 01/05/2013   Procedure: INTRAOPERATIVE TRANSESOPHAGEAL ECHOCARDIOGRAM;  Surgeon: Melrose Nakayama, MD;  Location: [place];  Service: Open Heart Surgery;  Laterality: N/A;   LEFT HEART CATHETERIZATION WITH CORONARY ANGIOGRAM N/A 01/03/2013   Procedure: LEFT HEART CATHETERIZATION WITH CORONARY ANGIOGRAM;  Surgeon: Lorretta Harp, MD;  Location: [facility] CATH LAB;  Service: Cardiovascular;  Laterality: N/A;   LOWER EXTREMITY ANGIOGRAM Right 12/07/2012   unsuccessful attempt at percutaneous revascularization of a calcified long segment chronic total occlusion mid left SFA /notes 12/07/2012   LOWER EXTREMITY ANGIOGRAM N/A 12/07/2012   Procedure: LOWER EXTREMITY ANGIOGRAM;  Surgeon: Lorretta Harp, MD;  Location: [facility] CATH LAB;  Service: Cardiovascular;  Laterality: N/A;   PROSTATE SURGERY  1990's    Family History  Problem Relation Age of Onset   Kidney  failure Mother    Hypertension Father    Stroke Father    Other Sister    Other Brother    Other Brother    Other Brother    Other Brother    Other Brother    Other Brother    Other Brother    Other Brother    Healthy Daughter    Healthy Daughter    Healthy Daughter    Healthy Son     Social History:  reports that he quit smoking about 41 years ago. His smoking use included cigarettes. He has a 15.00 pack-year smoking history. He has never used smokeless tobacco. He reports that he does not drink alcohol and does not use drugs.  Review of Systems:   Hypertension:  has had long-standing hypertension followed by his PCP and other physicians Blood pressure recently well controlled  BP Readings from Last 3 Encounters:  02/11/22 (!) 114/58  12/05/21 132/64  11/29/21 127/70     Lipids: Has been treated with Lipitor 40 mg Usually has lipid levels below target   Lab Results  Component Value Date   CHOL 143 09/18/2021   HDL 34 (L) 09/18/2021   LDLCALC 91 09/18/2021   TRIG 94 09/18/2021   CHOLHDL 4.2 09/18/2021    Neuropathy: Has been treated with gabapentin 100 mg by his PCP  Claudication: Has pain in the lower legs when he is walking, more on the left calf, followed by cardiologist     Foot exam done in 5/23, has absent pedal pulses otherwise normal monofilament sensation   RENAL   He does not have microalbuminuria  Lab Results  Component Value Date   CREATININE 1.32 02/04/2022   CREATININE 1.17 12/02/2021   CREATININE 1.29 (H) 10/25/2021       LABS:  No visits with results within 1 Week(s) from this visit.  Latest known visit with results is:  Lab on 02/04/2022  Component Date Value Ref Range Status   Microalb, Ur 02/04/2022 4.7 (H)  0.0 - 1.9 mg/dL Final   Creatinine,U 02/04/2022 85.7  mg/dL Final   Microalb Creat Ratio 02/04/2022 5.4  0.0 - 30.0 mg/g Final   Sodium 02/04/2022 137  135 - 145 mEq/L  Final   Potassium 02/04/2022 4.4  3.5 - 5.1 mEq/L  Final   Chloride 02/04/2022 103  96 - 112 mEq/L Final   CO2 02/04/2022 24  19 - 32 mEq/L Final   Glucose, Bld 02/04/2022 205 (H)  70 - 99 mg/dL Final   BUN 02/04/2022 20  6 - 23 mg/dL Final   Creatinine, Ser 02/04/2022 1.32  0.40 - 1.50 mg/dL Final   GFR 02/04/2022 48.21 (L)  >60.00 mL/min Final   Calculated using the CKD-EPI Creatinine Equation (2021)   Calcium 02/04/2022 9.1  8.4 - 10.5 mg/dL Final   Hgb A1c MFr Bld 02/04/2022 7.9 (H)  4.6 - 6.5 % Final   Glycemic Control Guidelines for People with Diabetes:Non Diabetic:  <6%Goal of Therapy: <7%Additional Action Suggested:  >8%      Examination:   BP (!) 114/58   Pulse 77   Ht '6\' 1"'$  (1.854 m)   Wt 243 lb (110.2 kg)   SpO2 98%   BMI 32.06 kg/m   Body mass index is 32.06 kg/m.     ASSESSMENT/ PLAN:     Diabetes type 2 insulin-dependent:   His A1c is now 7.9 and slightly better  See history of present illness for detailed discussion of current diabetes management, blood sugar patterns and problems identified  As discussed before he appears to be insulin deficient and seems to benefit from an insulin regimen alone which would be also more affordable for him  He has inadequate monitoring and difficult to know what his blood sugar readings are Does need to start CGM  Recommendations:  Prescription sent to [employer] [facility] to get the freestyle libre version 3 and will try to get this processed through the local [employer] company He will let us know when he gets this and will be instructed on starting this He will also try to get his daughter to make sure he calls to set up his supplies  Currently can continue the same dose of insulin except 810 units of NovoLog at lunch    Patient Instructions  NOVOLOG 10 for sandwich at lunch otherwise 8      Total visit time for evaluation and management, review of all relevant records, counseling = 40 minutes  Elayne Snare 02/11/2022, 4:26 PM

## 2022-02-11 NOTE — Patient Instructions (Addendum)
NOVOLOG 10 for sandwich at lunch otherwise 8

## 2022-02-18 DIAGNOSIS — I70203 Unspecified atherosclerosis of native arteries of extremities, bilateral legs: Secondary | ICD-10-CM | POA: Diagnosis not present

## 2022-02-18 DIAGNOSIS — E1142 Type 2 diabetes mellitus with diabetic polyneuropathy: Secondary | ICD-10-CM | POA: Diagnosis not present

## 2022-02-18 DIAGNOSIS — I130 Hypertensive heart and chronic kidney disease with heart failure and stage 1 through stage 4 chronic kidney disease, or unspecified chronic kidney disease: Secondary | ICD-10-CM | POA: Diagnosis not present

## 2022-02-19 ENCOUNTER — Other Ambulatory Visit: Payer: Self-pay | Admitting: Family Medicine

## 2022-02-19 DIAGNOSIS — Z8546 Personal history of malignant neoplasm of prostate: Secondary | ICD-10-CM

## 2022-02-19 DIAGNOSIS — N3943 Post-void dribbling: Secondary | ICD-10-CM

## 2022-02-26 ENCOUNTER — Other Ambulatory Visit: Payer: Self-pay | Admitting: Cardiovascular Disease

## 2022-02-26 NOTE — Telephone Encounter (Signed)
Pt pharmacy requesting refills on medication. Please address. Thank you

## 2022-02-27 ENCOUNTER — Other Ambulatory Visit: Payer: Self-pay | Admitting: *Deleted

## 2022-02-27 MED ORDER — AMLODIPINE BESYLATE 10 MG PO TABS: 10.0000 mg | ORAL_TABLET | Freq: Every day | ORAL | 0 refills | Status: DC

## 2022-03-06 ENCOUNTER — Other Ambulatory Visit: Payer: Self-pay | Admitting: Internal Medicine

## 2022-03-06 DIAGNOSIS — M109 Gout, unspecified: Secondary | ICD-10-CM

## 2022-03-11 DIAGNOSIS — Z23 Encounter for immunization: Secondary | ICD-10-CM | POA: Diagnosis not present

## 2022-03-11 DIAGNOSIS — Z794 Long term (current) use of insulin: Secondary | ICD-10-CM | POA: Diagnosis not present

## 2022-03-11 DIAGNOSIS — E1122 Type 2 diabetes mellitus with diabetic chronic kidney disease: Secondary | ICD-10-CM | POA: Diagnosis not present

## 2022-03-11 DIAGNOSIS — I130 Hypertensive heart and chronic kidney disease with heart failure and stage 1 through stage 4 chronic kidney disease, or unspecified chronic kidney disease: Secondary | ICD-10-CM | POA: Diagnosis not present

## 2022-03-11 DIAGNOSIS — N183 Chronic kidney disease, stage 3 unspecified: Secondary | ICD-10-CM | POA: Diagnosis not present

## 2022-03-12 ENCOUNTER — Ambulatory Visit
Admission: RE | Admit: 2022-03-12 | Discharge: 2022-03-12 | Disposition: A | Discharge status: Home / Self Care | Payer: Medicare Other | Source: Ambulatory Visit | Attending: Family Medicine | Admitting: Family Medicine

## 2022-03-12 DIAGNOSIS — Z8546 Personal history of malignant neoplasm of prostate: Secondary | ICD-10-CM

## 2022-03-12 DIAGNOSIS — N3943 Post-void dribbling: Secondary | ICD-10-CM

## 2022-03-14 DIAGNOSIS — H903 Sensorineural hearing loss, bilateral: Secondary | ICD-10-CM | POA: Diagnosis not present

## 2022-03-14 DIAGNOSIS — H9313 Tinnitus, bilateral: Secondary | ICD-10-CM | POA: Diagnosis not present

## 2022-03-24 NOTE — Progress Notes (Signed)
This encounter was created in error - please disregard.

## 2022-03-25 ENCOUNTER — Encounter: Payer: Medicare Other | Admitting: Cardiovascular Disease

## 2022-04-04 ENCOUNTER — Other Ambulatory Visit: Payer: Self-pay | Admitting: Internal Medicine

## 2022-04-04 DIAGNOSIS — E113293 Type 2 diabetes mellitus with mild 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, bilateral: Secondary | ICD-10-CM

## 2022-04-10 ENCOUNTER — Encounter: Payer: Self-pay | Admitting: General Practice

## 2022-04-28 ENCOUNTER — Other Ambulatory Visit: Payer: Self-pay | Admitting: Internal Medicine

## 2022-04-28 DIAGNOSIS — E113293 Type 2 diabetes mellitus with mild 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, bilateral: Secondary | ICD-10-CM

## 2022-05-07 ENCOUNTER — Ambulatory Visit: Payer: Medicare Other | Admitting: Endocrinology

## 2022-05-14 ENCOUNTER — Ambulatory Visit: Payer: Medicare Other | Admitting: Endocrinology

## 2022-05-15 ENCOUNTER — Encounter: Payer: Self-pay | Admitting: Cardiovascular Disease

## 2022-05-15 NOTE — Progress Notes (Signed)
[name] [name] Date of Birth  04/03/1931 [facility] / [facility] [id number]     [place], [place]  [address] [phone]  Fax  [phone]   Problem List 1. CAD - status post stenting and   status post coronary artery bypass grafting 2. Peripheral vascular disease 3. Diabetes mellitus 4. Hypertension 5. Hyperlipidemia    [place] is a 89 y.o. gentleman with a Hx of CAD - s/p stenting, diabetes mellitus, hypertension and hyperlipidemia. He presents today with the complaint of midsternal chest pain. This typically occurs after he eats some food and then goes out and doesn't work. It resolves after about 30 minutes.  He did not take any nitroglycerin. He took some Prilosec and it eventually resolved.   Jun 29, 2013:  [name] [name] has had CABG since I last saw him.    His CABG was complicated by several things - hematura and now he has urinary incontinence and wears depends.  Nov. 24, 2015:  [name] [name] is s/p CABG. He has CAD, DM and now has urninary incontinence.   Has seen Dr. Jeffie Pollock who does not think there are any options for his incontence.   September 18, 2014:  Doing well.  Has some indigestion .   Has seen Dr. Gwenlyn Found for peripheral vascular disease.  He high-grade segmental right and total left SFA stenosis with single-vessel runoff below the knees bilaterally.   Dr. Gwenlyn Found attempted unsuccessfully to percutaneously revascularize his left SFA.  Feb. 13, 2017:  No CP , No dyspnea  Dr. Gwenlyn Found was not able to revascularized his left SFA.    Has leg pain with walking - right > left ,  Aug. 16, 2017:  [name] [name] is seen today for follow-up visit. He has a history of coronary artery disease and peripheral vascular disease. Has seen Dr. Gwenlyn Found - attempted SFA revascularization but it was unsuccessful.   No cardiac complaints. Has significant claudication with any walking .  Has gotten some relief with gabapentin - causes a dry  mouth  Feb. 12, 2018:  Still eating some salty foods.  Some hot dogs, canned food, sausage  His blood pressure has been a little elevated and he has been having headaches on a regular basis.  Sept. 28, 2018:  [name] [name]  is feeling better. He's cut out a lot of his salty foods. Blood pressure is well-controlled. He thinks that one of his meds is causing a dry mouth may be causing him to have a dry mouth.   May 19, 2017  [name] [name] is doing well .  No CP or dyspnea.    Some pain in right leg with walking - if he goes to far.  Able to do most of his usual activities without too much trouble   November 25, 2017:  [name] is seen today for follow-up visit.  He has a history of coronary artery disease and peripheral vascular disease. He has a history of hypertension and hyperlipidemia. No CP or dyspnea.   Has left shoulder pain when the weather is cold .  Has some claudicatin when he walks.  May 27, 2019: [name] [name] is seen today for follow-up of his coronary artery disease and peripheral vascular disease.  He also has hypertension and hyperlipidemia.  No CP ,  Occasional leg pain  Has indigestion .   Takes pantoprozole  With relief.  Occurs after he eats. , no with exertion   November 28, 2019: [name] [name] is seen today for follow-up of his coronary artery  disease, peripheral vascular disease, hypertension, hyperlipidemia. Wt. Is 276 lbs , sitting around the house too much now with covid.  Has occasional stomach tightness. And gas pains No cp   Feb. 17.2023 [name] [name] is seen today for follow up of his CAD, PVD,HTN, HLD Wt is 264 Has been trying to loose some weight   Chronically short of breath . No much chest pain  Not much exercise,  does some walking  Has some indigestion like pain at times , not exertional   Labs from Aug. 2022 look good    May 15, 2022: [name] [name] is seen for follow up of his CAD, PVD, HTN Wt is  234 lbs  Having some headaches. Med list  includes Pletal but it is not in his bag - I'm not sure if he's taking it  Also on amlodipine which may be contributing to headach e Suggested tylenol   Overall he seems to be feeling okay.     Current Outpatient Medications on File Prior to Visit  Medication Sig Dispense Refill   amLODipine (NORVASC) 2.5 MG tablet Take 2.5 mg by mouth daily.     aspirin EC 81 MG tablet Take 1 tablet (81 mg total) by mouth daily.     cilostazol (PLETAL) 50 MG tablet Take by mouth.  Take 50 mg by mouth     furosemide (LASIX) 20 MG tablet Take 1 tablet (20 mg total) by mouth daily. 90 tablet 3   insulin aspart (NOVOLOG FLEXPEN) 100 UNIT/ML FlexPen 8-10 units before meals 15 mL 1   insulin glargine (LANTUS) 100 UNIT/ML injection Inject 11 Units into the skin daily.     JARDIANCE 10 MG TABS tablet TAKE 1 TABLET BY MOUTH ONCE DAILY BEFORE BREAKFAST 90 tablet 1   loratadine (EQ LORATADINE) 10 MG tablet TAKE 1 TABLET BY MOUTH DAILY AS NEEDED FOR RHINITIS. Strength: 10 mg 90 tablet 3   losartan (COZAAR) 50 MG tablet Take 1 tablet (50 mg total) by mouth daily. 90 tablet 3   metFORMIN (GLUCOPHAGE) 500 MG tablet Take 2 tablets (1,000 mg total) by mouth 2 (two) times daily with a meal. 120 tablet 5   metoprolol tartrate (LOPRESSOR) 25 MG tablet Take 2 tablets (50 mg total) by mouth 2 (two) times daily. 180 tablet 1   tamsulosin (FLOMAX) 0.4 MG CAPS capsule Take 1 capsule (0.4 mg total) by mouth daily. 90 capsule 3   trospium (SANCTURA) 20 MG tablet Take 20 mg by mouth in the morning and at bedtime.     allopurinol (ZYLOPRIM) 100 MG tablet Take 2 tablets by mouth once daily (Patient not taking: Reported on 02/11/2022) 180 tablet 0   atorvastatin (LIPITOR) 40 MG tablet Take 1 tablet (40 mg total) by mouth daily. (Patient not taking: Reported on 02/11/2022) 90 tablet 3   blood glucose meter kit and supplies KIT Dispense based on patient and insurance preference. Use up to four times daily as directed. (Patient not taking:  Reported on 02/11/2022) 1 each 0   colchicine 0.6 MG tablet Take 1 tablet (0.6 mg total) by mouth 2 (two) times daily as needed (gout flare). (Patient not taking: Reported on 02/11/2022) 60 tablet 1   diclofenac Sodium (VOLTAREN) 1 % GEL Apply 2 g topically 4 (four) times daily. (Patient not taking: Reported on 05/16/2022)     Dulaglutide (TRULICITY) 0.75 MG/0.5ML SOPN Inject 0.75 mg into the skin once a week. (Patient not taking: Reported on 02/11/2022) 2 mL 0   gabapentin (NEURONTIN) 100 MG  capsule Take 2 capsules (200 mg total) by mouth 3 (three) times daily. (Patient not taking: Reported on 02/11/2022) 180 capsule 3   pantoprazole (PROTONIX) 40 MG tablet Take 1 tablet (40 mg total) by mouth 2 (two) times daily. (Patient not taking: Reported on 02/11/2022) 180 tablet 3   No current facility-administered medications on file prior to visit.    No Known Allergies  Past Medical History:  Diagnosis Date   Benign prostatic hyperplasia (BPH) with urinary urge incontinence 11/13/2017   Chronic left shoulder pain 11/13/2017   Chronic non-seasonal allergic rhinitis 02/17/2006   Chronic systolic heart failure 10/31/2016   Echo (05/03/2015): LVEF 45-50%, grade 2 diastolic dysfunction   Coronary artery disease involving native coronary artery of native heart without angina pectoris 09/30/2007   s/p CABG on 01/05/2013: left internal mammary artery to left anterior descending, saphenous vein graft to obtuse marginal 1, sequential saphenous vein graft to acute marginal and posterior descending   Essential hypertension 11/04/2005   Gastroesophageal reflux disease with hiatal hernia 11/04/2005   History of prostate cancer 02/17/2006   Diagnosed 1995, s/p seed implantation in the late 1990's, PSA undetectable 04/10/2016   Hyperlipidemia 11/04/2005   Obesity (BMI 30.0-34.9) 11/13/2017   Peripheral arterial disease 09/30/2007   with claudication, failed attempt at LLE PTCA   Type 2 diabetes mellitus with microalbuminuria, with  long-term current use of insulin 11/13/2017   Type 2 diabetes mellitus with mild 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, bilateral 03/24/2018   Type 2 diabetes mellitus with peripheral neuropathy 11/04/2005   Urethral stricture in male 11/13/2017   Noted on cystoscopy 12/18/2014.  Reportedly asked to do chronic intermittent self catheterizations, but has not.    Past Surgical History:  Procedure Laterality Date   CARDIAC CATHETERIZATION  10/05/08   REVEALS HYPOKINESIS OF THE LATERAL WALL AND EF 50-55%   CORONARY ANGIOPLASTY WITH STENT PLACEMENT     CORONARY ARTERY BYPASS GRAFT N/A 01/05/2013   Procedure: CORONARY ARTERY BYPASS GRAFTING (CABG);  Surgeon: Loreli Slot, MD;  Location[facility] OR;  Service: Open Heart Surgery;  Laterality: N/A;   INTRAOPERATIVE TRANSESOPHAGEAL ECHOCARDIOGRAM N/A 01/05/2013   Procedure: INTRAOPERATIVE TRANSESOPHAGEAL ECHOCARDIOGRAM;  Surgeon: Loreli Slot, MD;  Location: [facility] OR;  Service: Open Heart Surgery;  Laterality: N/A;   LEFT HEART CATHETERIZATION WITH CORONARY ANGIOGRAM N/A 01/03/2013   Procedure: LEFT HEART CATHETERIZATION WITH CORONARY ANGIOGRAM;  Surgeon: [name], MD;  Location: [facility] CATH LAB;  Service: Cardiovascular;  Laterality: N/A;   LOWER EXTREMITY ANGIOGRAM Right 12/07/2012   unsuccessful attempt at percutaneous revascularization of a calcified long segment chronic total occlusion mid left SFA /notes 12/07/2012   LOWER EXTREMITY ANGIOGRAM N/A 12/07/2012   Procedure: LOWER EXTREMITY ANGIOGRAM;  Surgeon: [name], MD;  Location: [facility] CATH LAB;  Service: Cardiovascular;  Laterality: N/A;   PROSTATE SURGERY  1990's    Social History   Tobacco Use  Smoking Status Former   Packs/day: 0.75   Years: 20.00   Additional pack years: 0.00   Total pack years: 15.00   Types: Cigarettes   Quit date: 07/08/1980   Years since quitting: 41.8  Smokeless Tobacco Never    Social History   Substance and Sexual Activity   Alcohol Use No   Alcohol/week: 0.0 standard drinks of alcohol   Comment: 12/07/2012 "quit drinking alcohol > 25 yr ago"    Family History  Problem Relation Age of Onset   Kidney failure Mother    Hypertension Father  Stroke Father    Other Sister    Other Brother    Other Brother    Other Brother    Other Brother    Other Brother    Other Brother    Geographical information systems officer Daughter    Healthy Daughter    Healthy Daughter    Healthy Son     Reviw of Systems:  Reviewed in the HPI.  All other systems are negative.  Physical Exam: Blood pressure 132/66, pulse 85, height 6\' 1"  (1.854 m), weight 234 lb 3.2 oz (106.2 kg), SpO2 93 %.       GEN: Elderly, moderately obese gentleman, no acute distress  HEENT: Normal NECK: No JVD; No carotid bruits LYMPHATICS: No lymphadenopathy CARDIAC: RRR frequent premature beats. RESPIRATORY:  Clear to auscultation without rales, wheezing or rhonchi  ABDOMEN: Soft, non-tender, non-distended MUSCULOSKELETAL:  No edema; No deformity  SKIN: Warm and dry NEUROLOGIC:  Alert and oriented x 3    ECG:   Assessment / Plan:   1. CAD -     He denies any angina.  Continue current medications. . 2. Peripheral vascular disease -        3. Diabetes mellitus -    4. Hypertension -     blood pressure is well-controlled.  He is complaining of a headache.  He is on low-dose amlodipine which may be contributing to the headache.  Blood pressure is well-controlled.  I have suggested he try Tylenol.   5. Hyperlipidemia  -stable.      6. Chronic systolic congestive heart failure -   continue current medications.  7.  Headache: Complains of having a headache.  His med list includes Pletal but medication is not in his bag.  I am not sure if he is actually taking it or not.  Headache is a common side effect with that medication.  I encouraged him to take Tylenol as needed.    Kristeen Miss, MD  05/16/2022 8:32 AM    [facility] [address] [place],  [address] [place], [place]  [address] Pager [phone] Phone: [phone]; Fax: [phone]

## 2022-05-16 ENCOUNTER — Ambulatory Visit: Payer: Medicare Other | Attending: Cardiovascular Disease | Admitting: Cardiovascular Disease

## 2022-05-16 ENCOUNTER — Encounter: Payer: Self-pay | Admitting: Cardiovascular Disease

## 2022-05-16 VITALS — BP 132/66 | HR 85 | Ht 73.0 in | Wt 234.2 lb

## 2022-05-16 DIAGNOSIS — I2510 Atherosclerotic heart disease of native coronary artery without angina pectoris: Secondary | ICD-10-CM | POA: Diagnosis not present

## 2022-05-16 DIAGNOSIS — I5042 Chronic combined systolic (congestive) and diastolic (congestive) heart failure: Secondary | ICD-10-CM

## 2022-05-16 DIAGNOSIS — I10 Essential (primary) hypertension: Secondary | ICD-10-CM

## 2022-05-16 DIAGNOSIS — I739 Peripheral vascular disease, unspecified: Secondary | ICD-10-CM | POA: Diagnosis not present

## 2022-05-16 NOTE — Patient Instructions (Signed)
Medication Instructions:   Your physician recommends that you continue on your current medications as directed. Please refer to the Current Medication list given to you today.  *If you need a refill on your cardiac medications before your next appointment, please call your pharmacy*    Follow-Up: At [facility], you and your health needs are our priority.  As part of our continuing mission to provide you with exceptional heart care, we have created designated Provider Care Teams.  These Care Teams include your primary Cardiologist (physician) and Advanced Practice Providers (APPs -  Physician Assistants and Nurse Practitioners) who all work together to provide you with the care you need, when you need it.  We recommend signing up for the patient portal called "MyChart".  Sign up information is provided on this After Visit Summary.  MyChart is used to connect with patients for Virtual Visits (Telemedicine).  Patients are able to view lab/test results, encounter notes, upcoming appointments, etc.  Non-urgent messages can be sent to your provider as well.   To learn more about what you can do with MyChart, go to [email or web address].    Your next appointment:   1 year(s)  Provider:   Philip Nahser, MD       

## 2022-07-02 ENCOUNTER — Ambulatory Visit: Payer: Medicare Other | Admitting: Endocrinology

## 2022-07-02 ENCOUNTER — Encounter: Payer: Self-pay | Admitting: Endocrinology

## 2022-07-02 VITALS — BP 124/80 | HR 83 | Ht 73.0 in | Wt 229.4 lb

## 2022-07-02 DIAGNOSIS — E7800 Pure hypercholesterolemia, unspecified: Secondary | ICD-10-CM | POA: Diagnosis not present

## 2022-07-02 DIAGNOSIS — E1165 Type 2 diabetes mellitus with hyperglycemia: Secondary | ICD-10-CM

## 2022-07-02 DIAGNOSIS — Z794 Long term (current) use of insulin: Secondary | ICD-10-CM

## 2022-07-02 LAB — LIPID PANEL
Cholesterol: 152 mg/dL (ref 0–200)
HDL: 35.6 mg/dL — ABNORMAL LOW (ref >39.00)
LDL Cholesterol: 99 mg/dL (ref 0–99)
NonHDL: 116.45
Total CHOL/HDL Ratio: 4
Triglycerides: 85 mg/dL (ref 0.0–149.0)
VLDL: 17 mg/dL (ref 0.0–40.0)

## 2022-07-02 LAB — POCT GLYCOSYLATED HEMOGLOBIN (HGB A1C): Hemoglobin A1C: 8.9 % — AB (ref 4.0–5.6)

## 2022-07-02 LAB — BASIC METABOLIC PANEL
BUN: 29 mg/dL — ABNORMAL HIGH (ref 6–23)
CO2: 25 mEq/L (ref 19–32)
Calcium: 8.8 mg/dL (ref 8.4–10.5)
Chloride: 105 mEq/L (ref 96–112)
Creatinine, Ser: 1.32 mg/dL (ref 0.40–1.50)
GFR: 48.08 mL/min — ABNORMAL LOW (ref >60.00)
Glucose, Bld: 211 mg/dL — ABNORMAL HIGH (ref 70–99)
Potassium: 3.9 mEq/L (ref 3.5–5.1)
Sodium: 137 mEq/L (ref 135–145)

## 2022-07-02 NOTE — Progress Notes (Unsigned)
Patient ID: [name] [name], male   DOB: 04-14-1931, 89 y.o.   MRN: [id number]   Reason for Appointment: diabetes follow-up  History of Present Illness    DIABETES type II:  Diagnosis date: 1990  Previous history: He has been on insulin since 2005 Previously on Lantus and also subsequently basal bolus regimen but this was changed to NPH and regular insulin because of cost his A1c has been usually near 7% in the past He was switched from Lantus to NPH because of cost  Recent history:   INSULIN REGIMEN: Novolog 8 units 2 times daily, Lantus 12 units daily, 5 pm  Oral hypoglycemic drugs: Metformin ER 500 mg -- 2 bid   Current blood sugar patterns and problems identified:  His A1c is 8.9, previously was at 9.2   He has been continuing the same dose of insulin as before However does not take insulin at lunch to cover his meal at that time 2 despite specific instructions He says the last monitor he had is not working now As before he is not getting the freestyle libre sensor that was recommended on the last 2 visits, he states he was called by a vendor but he did not get the supplies No hypoglycemic symptoms reported He is again eating about 3 meals a day including a sandwich at dinner He thinks he is taking his insulin consistently before breakfast and dinner Also not adjusting it based on meal size or carbs His lab glucose after only a banana this morning was 219 in the office Weight is much lower compared to 1/24  .       Monitors blood glucose: <1 times a day.    Glucometer:   One Touch Verio         Blood Glucose readings not available  Meals: 2-3 meals per day.  breakfast is eggs,oatmeal usually.   Breakfast 7 AM, lunch 1 PM and dinner 6-7                 Dietician visit: Most recent:  02/2017     Weight control:   Wt Readings from Last 3 Encounters:  07/02/22 229 lb 6.4 oz (104.1 kg)  05/16/22 234 lb 3.2 oz (106.2 kg)  02/11/22 243 lb (110.2 kg)            Diabetes labs:  Lab Results  Component Value Date   HGBA1C 8.9 (A) 07/02/2022   HGBA1C 7.9 (H) 02/04/2022   HGBA1C 9.2 (H) 12/02/2021   Lab Results  Component Value Date   MICROALBUR 4.7 (H) 02/04/2022   LDLCALC 91 09/18/2021   CREATININE 1.32 02/04/2022   No results found for: "FRUCTOSAMINE"    Allergies as of 07/02/2022   No Known Allergies      Medication List        Accurate as of Jul 02, 2022  9:51 AM. If you have any questions, ask your nurse or doctor.          allopurinol 100 MG tablet Commonly known as: ZYLOPRIM Take 2 tablets by mouth once daily   amLODipine 2.5 MG tablet Commonly known as: NORVASC Take 2.5 mg by mouth daily.   aspirin EC 81 MG tablet Take 1 tablet (81 mg total) by mouth daily.   atorvastatin 40 MG tablet Commonly known as: LIPITOR Take 1 tablet (40 mg total) by mouth daily.   blood glucose meter kit and supplies Kit Dispense based on patient and insurance preference. Use up to four  times daily as directed.   cilostazol 50 MG tablet Commonly known as: PLETAL Take by mouth.  Take 50 mg by mouth   colchicine 0.6 MG tablet Take 1 tablet (0.6 mg total) by mouth 2 (two) times daily as needed (gout flare).   furosemide 20 MG tablet Commonly known as: LASIX Take 1 tablet (20 mg total) by mouth daily.   gabapentin 100 MG capsule Commonly known as: NEURONTIN Take 200 mg by mouth 3 (three) times daily.   Jardiance 10 MG Tabs tablet Generic drug: empagliflozin TAKE 1 TABLET BY MOUTH ONCE DAILY BEFORE BREAKFAST   Lantus 100 UNIT/ML injection Generic drug: insulin glargine Inject 11 Units into the skin daily.   loratadine 10 MG tablet Commonly known as: EQ Loratadine TAKE 1 TABLET BY MOUTH DAILY AS NEEDED FOR RHINITIS. Strength: 10 mg   losartan 50 MG tablet Commonly known as: COZAAR Take 1 tablet (50 mg total) by mouth daily.   metFORMIN 500 MG tablet Commonly known as: Glucophage Take 2 tablets (1,000 mg total)  by mouth 2 (two) times daily with a meal.   metoprolol tartrate 25 MG tablet Commonly known as: LOPRESSOR Take 2 tablets (50 mg total) by mouth 2 (two) times daily.   NovoLOG FlexPen 100 UNIT/ML FlexPen Generic drug: insulin aspart 8-10 units before meals   tamsulosin 0.4 MG Caps capsule Commonly known as: FLOMAX Take 1 capsule (0.4 mg total) by mouth daily.   trospium 20 MG tablet Commonly known as: SANCTURA Take 20 mg by mouth in the morning and at bedtime.   Trulicity 0.75 MG/0.5ML Sopn Generic drug: Dulaglutide Inject 0.75 mg into the skin once a week.        Allergies: No Known Allergies  Past Medical History:  Diagnosis Date   Benign prostatic hyperplasia (BPH) with urinary urge incontinence 11/13/2017   Chronic left shoulder pain 11/13/2017   Chronic non-seasonal allergic rhinitis 02/17/2006   Chronic systolic heart failure (HCC) 10/31/2016   Echo (05/03/2015): LVEF 45-50%, grade 2 diastolic dysfunction   Coronary artery disease involving native coronary artery of native heart without angina pectoris 09/30/2007   s/p CABG on 01/05/2013: left internal mammary artery to left anterior descending, saphenous vein graft to obtuse marginal 1, sequential saphenous vein graft to acute marginal and posterior descending   Essential hypertension 11/04/2005   Gastroesophageal reflux disease with hiatal hernia 11/04/2005   History of prostate cancer 02/17/2006   Diagnosed 1995, s/p seed implantation in the late 1990's, PSA undetectable 04/10/2016   Hyperlipidemia 11/04/2005   Obesity (BMI 30.0-34.9) 11/13/2017   Peripheral arterial disease (HCC) 09/30/2007   with claudication, failed attempt at LLE PTCA   Type 2 diabetes mellitus with microalbuminuria, with long-term current use of insulin (HCC) 11/13/2017   Type 2 diabetes mellitus with mild 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, bilateral (HCC) 03/24/2018   Type 2 diabetes mellitus with peripheral neuropathy (HCC)  11/04/2005   Urethral stricture in male 11/13/2017   Noted on cystoscopy 12/18/2014.  Reportedly asked to do chronic intermittent self catheterizations, but has not.    Past Surgical History:  Procedure Laterality Date   CARDIAC CATHETERIZATION  10/05/08   REVEALS HYPOKINESIS OF THE LATERAL WALL AND EF 50-55%   CORONARY ANGIOPLASTY WITH STENT PLACEMENT     CORONARY ARTERY BYPASS GRAFT N/A 01/05/2013   Procedure: CORONARY ARTERY BYPASS GRAFTING (CABG);  Surgeon: Loreli Slot, MD;  Location: [facility] OR;  Service: Open Heart Surgery;  Laterality: N/A;   INTRAOPERATIVE TRANSESOPHAGEAL ECHOCARDIOGRAM N/A  01/05/2013   Procedure: INTRAOPERATIVE TRANSESOPHAGEAL ECHOCARDIOGRAM;  Surgeon: Loreli Slot, MD;  Location: [facility] OR;  Service: Open Heart Surgery;  Laterality: N/A;   LEFT HEART CATHETERIZATION WITH CORONARY ANGIOGRAM N/A 01/03/2013   Procedure: LEFT HEART CATHETERIZATION WITH CORONARY ANGIOGRAM;  Surgeon: [name], MD;  Location: [facility] CATH LAB;  Service: Cardiovascular;  Laterality: N/A;   LOWER EXTREMITY ANGIOGRAM Right 12/07/2012   unsuccessful attempt at percutaneous revascularization of a calcified long segment chronic total occlusion mid left SFA /notes 12/07/2012   LOWER EXTREMITY ANGIOGRAM N/A 12/07/2012   Procedure: LOWER EXTREMITY ANGIOGRAM;  Surgeon: [name], MD;  Location: [facility] CATH LAB;  Service: Cardiovascular;  Laterality: N/A;   PROSTATE SURGERY  1990's    Family History  Problem Relation Age of Onset   Kidney failure Mother    Hypertension Father    Stroke Father    Other Sister    Other Brother    Other Brother    Other Brother    Other Brother    Other Brother    Other Brother    Other Brother    Other Brother    Healthy Daughter    Healthy Daughter    Healthy Daughter    Healthy Son     Social History:  reports that he quit smoking about 42 years ago. His smoking use included cigarettes. He has a 15.00 pack-year smoking history. He has never  used smokeless tobacco. He reports that he does not drink alcohol and does not use drugs.  Review of Systems:   Hypertension:  has had long-standing hypertension followed by his PCP and other physicians Blood pressure appears well controlled  BP Readings from Last 3 Encounters:  07/02/22 124/80  05/16/22 132/66  02/11/22 (!) 114/58    Lipids: Has been treated with Lipitor 40 mg for hypercholesterolemia   Lab Results  Component Value Date   CHOL 143 09/18/2021   HDL 34 (L) 09/18/2021   LDLCALC 91 09/18/2021   TRIG 94 09/18/2021   CHOLHDL 4.2 09/18/2021    Neuropathy: Has been treated with gabapentin 100 mg by his PCP  Claudication: Has pain in the lower legs when he is walking, more on the left calf, followed by cardiologist     Foot exam done in 5/23, has absent pedal pulses otherwise normal monofilament sensation   RENAL   He does not have microalbuminuria  Lab Results  Component Value Date   CREATININE 1.32 02/04/2022   CREATININE 1.17 12/02/2021   CREATININE 1.29 (H) 10/25/2021       LABS:  Office Visit on 07/02/2022  Component Date Value Ref Range Status   Hemoglobin A1C 07/02/2022 8.9 (A)  4.0 - 5.6 % Final     Examination:   BP 124/80   Pulse 83   Ht 6\' 1"  (1.854 m)   Wt 229 lb 6.4 oz (104.1 kg)   BMI 30.27 kg/m   Body mass index is 30.27 kg/m.     ASSESSMENT/ PLAN:     Diabetes type 2 insulin-dependent:   His A1c is now 8.9 compared to 7.9   See history of present illness for detailed discussion of current diabetes management, blood sugar patterns and problems identified  Unable to assess his blood sugar readings as he has not used the monitor lately Appears to be needing higher doses of insulin Since his glucose is still over 200 this morning with only eating a banana he likely needs more basal insulin also  He  has not checked his blood sugar lately and unclear what his blood sugar patterns at home have been He can benefit from CGM  but unlikely that he will be able to learn to use it, has been sent prescriptions a couple of times through the [place] website also  HYPERLIPIDEMIA: Needs follow-up labs, has not had any for over a year now  Recommendations:  Accu-Chek meter was given in the office He will start checking his blood sugars regularly alternating morning and after lunch or dinner readings Increase Lantus to 14 units for now Make sure he takes 8 units of insulin to cover his lunch unless skipping the meal Need to adjust insulin further when he has glucose monitoring results available  He will bring meter for download on next visit    Patient Instructions  Lantus 14 units daily  Check blood sugars on waking up 3 days a week  Also check blood sugars about 2 hours after meals and do this after different meals by rotation  Recommended blood sugar levels on waking up are 90-130 and about 2 hours after meal is 130-160  Please bring your blood sugar monitor to each visit, thank you       Reather Littler 07/02/2022, 9:51 AM   Addendum: Labs show control of lipids with low normal HDL needs to start back on Lipitor Renal function stable

## 2022-07-02 NOTE — Patient Instructions (Signed)
Lantus 14 units daily  Check blood sugars on waking up 3 days a week  Also check blood sugars about 2 hours after meals and do this after different meals by rotation  Recommended blood sugar levels on waking up are 90-130 and about 2 hours after meal is 130-160  Please bring your blood sugar monitor to each visit, thank you

## 2022-07-07 ENCOUNTER — Other Ambulatory Visit: Payer: Self-pay | Admitting: Family Medicine

## 2022-07-07 ENCOUNTER — Ambulatory Visit
Admission: RE | Admit: 2022-07-07 | Discharge: 2022-07-07 | Disposition: A | Discharge status: Home / Self Care | Payer: Medicare Other | Source: Ambulatory Visit | Attending: Family Medicine | Admitting: Family Medicine

## 2022-07-07 DIAGNOSIS — L089 Local infection of the skin and subcutaneous tissue, unspecified: Secondary | ICD-10-CM

## 2022-07-29 ENCOUNTER — Telehealth: Payer: Self-pay | Admitting: Cardiovascular Disease

## 2022-07-29 MED ORDER — ATORVASTATIN CALCIUM 40 MG PO TABS: 40.0000 mg | ORAL_TABLET | Freq: Every day | ORAL | 3 refills | Status: AC

## 2022-07-29 NOTE — Telephone Encounter (Signed)
Patient stated his diabetes doctor told him he should speak with his cardiologist.

## 2022-07-29 NOTE — Telephone Encounter (Signed)
Returned call to patient who doesn't know why endocrinology wanted him to see cardiology. Was last seen by Dr Elease Hashimoto on 05/16/22 and advised to follow-up in 1 year. Notes in EPIC from [place] office visit on 07/02/22 don't list anything about needing to see cardiology. However, he did check pt's lipids and states he should get back on Atorvastatin. Patient states he doesn't have any, doesn't know how long he's been out, but informed him that we do want him on it and will send a refill in for him. Below notes from Kumar's visit: Lipids: Has been treated with Lipitor 40 mg for hypercholesterolemia  Addendum: Labs show control of lipids with low normal HDL needs to start back on Lipitor Renal function stable

## 2022-07-31 ENCOUNTER — Encounter (HOSPITAL_BASED_OUTPATIENT_CLINIC_OR_DEPARTMENT_OTHER): Payer: Medicare Other | Attending: General Surgery | Admitting: General Surgery

## 2022-07-31 DIAGNOSIS — I2510 Atherosclerotic heart disease of native coronary artery without angina pectoris: Secondary | ICD-10-CM | POA: Diagnosis not present

## 2022-07-31 DIAGNOSIS — I110 Hypertensive heart disease with heart failure: Secondary | ICD-10-CM | POA: Diagnosis not present

## 2022-07-31 DIAGNOSIS — E1151 Type 2 diabetes mellitus with diabetic peripheral angiopathy without gangrene: Secondary | ICD-10-CM | POA: Insufficient documentation

## 2022-07-31 DIAGNOSIS — K219 Gastro-esophageal reflux disease without esophagitis: Secondary | ICD-10-CM | POA: Insufficient documentation

## 2022-07-31 DIAGNOSIS — I5022 Chronic systolic (congestive) heart failure: Secondary | ICD-10-CM | POA: Diagnosis not present

## 2022-07-31 DIAGNOSIS — L97512 Non-pressure chronic ulcer of other part of right foot with fat layer exposed: Secondary | ICD-10-CM | POA: Insufficient documentation

## 2022-07-31 DIAGNOSIS — E11621 Type 2 diabetes mellitus with foot ulcer: Secondary | ICD-10-CM | POA: Diagnosis present

## 2022-07-31 NOTE — Progress Notes (Signed)
[name], [name] ([id number]) [id number] Page 1 of 4 Visit Report for 07/31/2022 Abuse Risk Screen Details Patient Name: Date of Service: [name] [name], [name] [name] 07/31/2022 8:00 A M Medical Record Number: [id number] Patient Account Number: [id number] Date of Birth/Sex: Treating RN: January 14, 1932 (89 y.o. [name] Primary Care Belky Mundo: [name], NO Other Clinician: Referring Forrester Blando: Treating Nicolus Ose/Extender: [name] in Treatment: 0 Abuse Risk Screen Items Answer ABUSE RISK SCREEN: Has anyone close to you tried to hurt or harm you recentlyo No Do you feel uncomfortable with anyone in your familyo No Has anyone forced you do things that you didnt want to doo No Electronic Signature(s) Signed: 07/31/2022 12:38:14 PM By: Brenton Grills Entered By: Brenton Grills on 07/31/2022 08:07:33 -------------------------------------------------------------------------------- Activities of Daily Living Details Patient Name: Date of Service: [name], [name] 07/31/2022 8:00 A M Medical Record Number: [id number] Patient Account Number: [id number] Date of Birth/Sex: Treating RN: 01-16-1932 (89 y.o. [name] Primary Care Elnathan Fulford: [name], NO Other Clinician: Referring Asaph Serena: Treating Tylar Merendino/Extender: [name] in Treatment: 0 Activities of Daily Living Items Answer Activities of Daily Living (Please select one for each item) Drive Automobile Completely Able T Medications ake Completely Able Use T elephone Completely Able Care for Appearance Completely Able Use T oilet Completely Able Bath / Shower Completely Able Dress Self Completely Able Feed Self Completely Able Walk Completely Able Get In / Out Bed Completely Able Housework Completely Able Prepare Meals Completely Able Handle Money Completely Able Shop for Self Completely Able Electronic Signature(s) Signed: 07/31/2022  12:38:14 PM By: Brenton Grills Entered By: Brenton Grills on 07/31/2022 08:08:11 Thad Ranger ([id number]) [id number] Page 2 of 4 -------------------------------------------------------------------------------- Education Screening Details Patient Name: Date of Service: [name], [name] 07/31/2022 8:00 A M Medical Record Number: [id number] Patient Account Number: [id number] Date of Birth/Sex: Treating RN: 16-Nov-1931 (89 y.o. [name] Primary Care Marnae Madani: [name], NO Other Clinician: Referring Willo Yoon: Treating Waleed Dettman/Extender: [name] in Treatment: 0 Learning Preferences/Education Level/Primary Language Highest Education Level: High School Preferred Language: English Cognitive Barrier Language Barrier: No Translator Needed: No Memory Deficit: No Emotional Barrier: No Physical Barrier Impaired Vision: Yes Glasses Impaired Hearing: Yes Decreased Hand dexterity: No Knowledge/Comprehension Knowledge Level: Medium Comprehension Level: Medium Ability to understand written instructions: Medium Ability to understand verbal instructions: Medium Motivation Anxiety Level: Calm Cooperation: Cooperative Education Importance: Acknowledges Need Interest in Health Problems: Asks Questions Perception: Coherent Willingness to Engage in Self-Management High Activities: Readiness to Engage in Self-Management High Activities: Electronic Signature(s) Signed: 07/31/2022 12:38:14 PM By: Brenton Grills Entered By: Brenton Grills on 07/31/2022 08:10:24 -------------------------------------------------------------------------------- Fall Risk Assessment Details Patient Name: Date of Service: [name], [name] 07/31/2022 8:00 A M Medical Record Number: [id number] Patient Account Number: [id number] Date of Birth/Sex: Treating RN: 04-May-1931 (89 y.o. [name] Primary Care Chassie Pennix: [name], NO Other  Clinician: Referring Harue Pribble: Treating Josselyn Harkins/Extender: [name] in Treatment: 0 Fall Risk Assessment Items Have you had 2 or more falls in the last 12 monthso 0 No Have you had any fall that resulted in injury in the last 12 monthso 0 No FALLS RISK SCREEN History of falling - immediate or within 3 months 0 No CRIPPS, Barnabas Lister ([id number]) [phone] [id number] Page 3 of 4 Secondary diagnosis (Do you have 2 or more medical diagnoseso) 0 No Ambulatory aid None/bed rest/wheelchair/nurse 0 No Crutches/cane/walker 0 No Furniture 0 No Intravenous therapy Access/Saline/Heparin Lock 0 No Gait/Transferring  Normal/ bed rest/ wheelchair 0 No Weak (short steps with or without shuffle, stooped but able to lift head while walking, may seek 0 No support from furniture) Impaired (short steps with shuffle, may have difficulty arising from chair, head down, impaired 0 No balance) Mental Status Oriented to own ability 0 No Electronic Signature(s) Signed: 07/31/2022 12:38:14 PM By: Brenton Grills Entered By: Brenton Grills on 07/31/2022 08:10:37 -------------------------------------------------------------------------------- Foot Assessment Details Patient Name: Date of Service: [name], [name] [facility] 07/31/2022 8:00 A M Medical Record Number: [id number] Patient Account Number: [id number] Date of Birth/Sex: Treating RN: [birth year] (89 y.o. [name] Primary Care Neko Boyajian: PA TIENT, NO Other Clinician: Referring Jonthan Leite: Treating Kabe Mckoy/Extender: [name] in Treatment: 0 Foot Assessment Items Site Locations + = Sensation present, - = Sensation absent, C = Callus, U = Ulcer R = Redness, W = Warmth, M = Maceration, PU = Pre-ulcerative lesion F = Fissure, S = Swelling, D = Dryness Assessment Right: Left: Other Deformity: No No Prior Foot Ulcer: No No Prior Amputation: No No Charcot Joint: No No Ambulatory  Status: Ambulatory Without Help [id number]BYRAN, BILOTTI ([id number]) [id number] Page 4 of 4 Signed: 07/31/2022 12:38:14 PM By: Brenton Grills Entered By: Brenton Grills on 07/31/2022 08:11:46 -------------------------------------------------------------------------------- Nutrition Risk Screening Details Patient Name: Date of Service: KAUSHIK, MAUL 07/31/2022 8:00 A M Medical Record Number: [id number] Patient Account Number: [id number] Date of Birth/Sex: Treating RN: 1931-04-26 (89 y.o. [name] Primary Care Ariyon Mittleman: [name], NO Other Clinician: Referring Debora Stockdale: Treating Karalina Tift/Extender: [name] in Treatment: 0 Height (in): 73 Weight (lbs): 233 Body Mass Index (BMI): 30.7 Nutrition Risk Screening Items Score Screening NUTRITION RISK SCREEN: I have an illness or condition that made me change the kind and/or amount of food I eat 0 No I eat fewer than two meals per day 0 No I eat few fruits and vegetables, or milk products 0 No I have three or more drinks of beer, liquor or wine almost every day 0 No I have tooth or mouth problems that make it hard for me to eat 0 No I don't always have enough money to buy the food I need 0 No I eat alone most of the time 0 No I take three or more different prescribed or over-the-counter drugs a day 0 No Without wanting to, I have lost or gained 10 pounds in the last six months 0 No I am not always physically able to shop, cook and/or feed myself 0 No Nutrition Protocols Good Risk Protocol Moderate Risk Protocol High Risk Proctocol Risk Level: Good Risk Score: 0 Electronic Signature(s) Signed: 07/31/2022 12:38:14 PM By: Brenton Grills Entered By: Brenton Grills on 07/31/2022 08:10:42

## 2022-07-31 NOTE — Progress Notes (Signed)
MAEL, DELAP ([id number]) [id number] Page 1 of 10 Visit Report for 07/31/2022 Allergy List Details Patient Name: Date of Service: [name] [name], [name] [name] 07/31/2022 8:00 A M Medical Record Number: [id number] Patient Account Number: [id number] Date of Birth/Sex: Treating RN: 1931/03/13 (89 y.o. [name] [name] Primary Care Caellum Mancil: [name], NO Other Clinician: Referring Sakeenah Valcarcel: Treating Kavin Weckwerth/Extender: [name] in Treatment: 0 Allergies Active Allergies No Known Allergies Allergy Notes Electronic Signature(s) Signed: 07/31/2022 12:38:14 PM By: Brenton Grills Entered By: Brenton Grills on 07/31/2022 07:51:13 -------------------------------------------------------------------------------- Arrival Information Details Patient Name: Date of Service: [name], [name] [facility] N 07/31/2022 8:00 A M Medical Record Number: [id number] Patient Account Number: [id number] Date of Birth/Sex: Treating RN: [birth year] (89 y.o. M) Primary Care Kaly Mcquary: [name], NO Other Clinician: Referring Zareth Rippetoe: Treating Jaliyah Fotheringham/Extender: [name] in Treatment: 0 Visit Information Patient Arrived: Ambulatory Arrival Time: 08:01 Accompanied By: self Transfer Assistance: None Patient Identification Verified: Yes Secondary Verification Process Completed: Yes Electronic Signature(s) Signed: 07/31/2022 10:11:18 AM By: Dayton Scrape Entered By: Dayton Scrape on 07/31/2022 08:02:07 -------------------------------------------------------------------------------- Clinic Level of Care Assessment Details Patient Name: Date of Service: [name] [name], [name] [name] 07/31/2022 8:00 A M Medical Record Number: [id number] Patient Account Number: [id number] Date of Birth/Sex: Treating RN: [birth year] (89 y.o. [name] [name] Primary Care Tamya Denardo: PA Fidela Juneau Other Clinician: [name], [name] ([id number]) [id number] Page  2 of 10 Referring Josiyah Tozzi: Treating Ashvin Adelson/Extender: [name] in Treatment: 0 Clinic Level of Care Assessment Items TOOL 1 Quantity Score X- 1 0 Use when EandM and Procedure is performed on INITIAL visit ASSESSMENTS - Nursing Assessment / Reassessment X- 1 20 General Physical Exam (combine w/ comprehensive assessment (listed just below) when performed on new pt. evals) X- 1 25 Comprehensive Assessment (HX, ROS, Risk Assessments, Wounds Hx, etc.) ASSESSMENTS - Wound and Skin Assessment / Reassessment X- 1 10 Dermatologic / Skin Assessment (not related to wound area) ASSESSMENTS - Ostomy and/or Continence Assessment and Care []  - 0 Incontinence Assessment and Management []  - 0 Ostomy Care Assessment and Management (repouching, etc.) PROCESS - Coordination of Care []  - 0 Simple Patient / Family Education for ongoing care X- 1 20 Complex (extensive) Patient / Family Education for ongoing care X- 1 10 Staff obtains Chiropractor, Records, T Results / Process Orders est X- 1 10 Staff telephones HHA, Nursing Homes / Clarify orders / etc []  - 0 Routine Transfer to another Facility (non-emergent condition) []  - 0 Routine Hospital Admission (non-emergent condition) []  - 0 New Admissions / Manufacturing engineer / Ordering NPWT Apligraf, etc. , []  - 0 Emergency Hospital Admission (emergent condition) PROCESS - Special Needs []  - 0 Pediatric / Minor Patient Management []  - 0 Isolation Patient Management []  - 0 Hearing / Language / Visual special needs X- 1 15 Assessment of Community assistance (transportation, D/C planning, etc.) X- 1 15 Additional assistance / Altered mentation []  - 0 Support Surface(s) Assessment (bed, cushion, seat, etc.) INTERVENTIONS - Miscellaneous []  - 0 External ear exam []  - 0 Patient Transfer (multiple staff / Nurse, adult / Similar devices) []  - 0 Simple Staple / Suture removal (25 or less) []  - 0 Complex Staple /  Suture removal (26 or more) []  - 0 Hypo/Hyperglycemic Management (do not check if billed separately) X- 1 15 Ankle / Brachial Index (ABI) - do not check if billed separately Has the patient been seen at the hospital within the last three years: Yes Total Score: 140 Level Of Care: New/Established -  Level 4 Electronic Signature(s) Signed: 07/31/2022 12:38:14 PM By: Brenton Grills Entered By: Brenton Grills on 07/31/2022 09:07:57 Encounter Discharge Information Details -------------------------------------------------------------------------------- [name] [name] ([id number]) [id number] Page 3 of 10 Patient Name: Date of Service: [name] [name], [name] [name] 07/31/2022 8:00 A M Medical Record Number: [id number] Patient Account Number: [id number] Date of Birth/Sex: Treating RN: [birth year] (89 y.o. [name] [name] Primary Care Laszlo Ellerby: [name], NO Other Clinician: Referring Minerva Bluett: Treating Placido Hangartner/Extender: [name] in Treatment: 0 Encounter Discharge Information Items Post Procedure Vitals Discharge Condition: Stable Temperature (F): 98.4 Ambulatory Status: Ambulatory Pulse (bpm): 76 Discharge Destination: Home Respiratory Rate (breaths/min): 18 Transportation: Private Auto Blood Pressure (mmHg): 132/78 Accompanied By: self Schedule Follow-up Appointment: Yes Clinical Summary of Care: Patient Declined Electronic Signature(s) Signed: 07/31/2022 12:38:14 PM By: Brenton Grills Entered By: Brenton Grills on 07/31/2022 09:01:27 -------------------------------------------------------------------------------- Lower Extremity Assessment Details Patient Name: Date of Service: [name] [name], [name] [name] 07/31/2022 8:00 A M Medical Record Number: [id number] Patient Account Number: [id number] Date of Birth/Sex: Treating RN: [birth year] (89 y.o. [name] [name] Primary Care Alpha Chouinard: [name], NO Other Clinician: Referring Morgen Linebaugh: Treating  Trinidi Toppins/Extender: [name] in Treatment: 0 Edema Assessment Assessed: [Left: No] [Right: No] [Left: Edema] [Right: :] Calf Left: Right: Point of Measurement: From Medial Instep 38 cm Ankle Left: Right: Point of Measurement: From Medial Instep 23 cm Vascular Assessment Pulses: Dorsalis Pedis Palpable: [Right:Yes] Blood Pressure: Brachial: [Right:149] Ankle: [Right:Dorsalis Pedis: 124 0.83] Electronic Signature(s) Signed: 07/31/2022 12:38:14 PM By: Brenton Grills Entered By: Brenton Grills on 07/31/2022 08:18:25 [name] [name] ([id number]) [id number] Page 4 of 10 -------------------------------------------------------------------------------- Multi Wound Chart Details Patient Name: Date of Service: [name] [name], [name] [name] 07/31/2022 8:00 A M Medical Record Number: [id number] Patient Account Number: [id number] Date of Birth/Sex: Treating RN: October 27, 1931 (89 y.o. 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 Primary Etiology: Extremity Coronary Artery Disease, Coronary Artery Disease, N/A Comorbid History: Hypertension, Peripheral Venous Hypertension, Peripheral Venous Disease, Type II Diabetes Disease, Type II Diabetes 06/04/2022 06/04/2022 N/A Date Acquired: 0 0 N/A Weeks of Treatment: Open Open N/A Wound Status: No No N/A Wound Recurrence: 0.8x1x0.1 0.3x0.2x0.1 N/A Measurements L x W x D  (cm) 0.628 0.047 N/A A (cm) : rea 0.063 0.005 N/A Volume (cm) : Full Thickness Without Exposed Grade 1 N/A Classification: Support Structures Medium Medium N/A Exudate A mount: Serosanguineous Serosanguineous N/A Exudate Type: red, brown red, brown N/A Exudate Color: Distinct, outline attached Distinct, outline attached N/A Wound Margin: Medium (34-66%) Medium (34-66%) N/A Granulation A mount: Red, Pink Pink N/A Granulation Quality: Medium (34-66%) Medium (34-66%) N/A Necrotic A mount: Eschar Eschar N/A Necrotic Tissue: Fat Layer (Subcutaneous Tissue): Yes Fat Layer (Subcutaneous Tissue): Yes N/A Exposed Structures: Debridement - Selective/Open Wound Debridement - Selective/Open Wound N/A Debridement: Pre-procedure Verification/Time Out 08:36 08:36 N/A Taken: Lidocaine 4% Topical Solution Lidocaine 4% T opical Solution N/A Pain Control: Necrotic/Eschar Callus, Slough N/A Tissue Debrided: Non-Viable Tissue Non-Viable Tissue N/A Level: 0.63 0.05 N/A Debridement A (sq cm): rea Curette Curette N/A Instrument: Minimum Minimum N/A Bleeding: Pressure Pressure N/A Hemostasis A chieved: 0 0 N/A Procedural  Pain: 0 0 N/A Post Procedural Pain: Procedure was tolerated well Procedure was tolerated well N/A Debridement Treatment Response: 0.8x1x0.1 0.3x0.2x0.1 N/A Post Debridement Measurements L x W x D (cm) 0.063 0.005 N/A Post Debridement Volume: (cm) Scarring: Yes N/A Periwound Skin Texture: Hemosiderin Staining: Yes N/A Periwound Skin Color: Debridement Debridement N/A Procedures Performed: Treatment Notes Wound #1 (Toe Great) Wound Laterality: Right, Anterior Cleanser Peri-Wound Care ZERRICK, HANSSEN ([id number]) [id number] Page 5 of 10 Topical Primary Dressing Maxorb Extra Ag+ Alginate Dressing, 2x2 (in/in) Discharge Instruction: Apply to wound bed as instructed Secondary Dressing Optifoam Non-Adhesive Dressing, 4x4  in Discharge Instruction: Apply over primary dressing as directed. Woven Gauze Sponge, Non-Sterile 4x4 in Discharge Instruction: Apply over primary dressing as directed. Secured With Transpore Surgical Tape, 2x10 (in/yd) Discharge Instruction: Secure dressing with tape as directed. Compression Wrap Compression Stockings Add-Ons Wound #2 (Toe Great) Wound Laterality: Plantar, Right Cleanser Peri-Wound Care Topical Primary Dressing Maxorb Extra Ag+ Alginate Dressing, 2x2 (in/in) Discharge Instruction: Apply to wound bed as instructed Secondary Dressing Optifoam Non-Adhesive Dressing, 4x4 in Discharge Instruction: Apply over primary dressing as directed. Woven Gauze Sponge, Non-Sterile 4x4 in Discharge Instruction: Apply over primary dressing as directed. Secured With Transpore Surgical Tape, 2x10 (in/yd) Discharge Instruction: Secure dressing with tape as directed. Compression Wrap Compression Stockings Add-Ons Electronic Signature(s) Signed: 07/31/2022 9:03:23 AM By: Duanne Guess MD FACS Entered By: Duanne Guess on 07/31/2022 09:03:23 -------------------------------------------------------------------------------- Multi-Disciplinary Care Plan Details Patient Name: Date of Service: [name] [name], [name] [name] [facility] N 07/31/2022 8:00 A M Medical Record Number: [id number] Patient Account Number: [id number] Date of Birth/Sex: Treating RN: 12-08-1931 (89 y.o. [name] [name] Primary Care Tekla Malachowski: [name], NO Other Clinician: Referring Trenice Mesa: Treating Terelle Dobler/Extender: [name] in Treatment: [address] [place], [place] ([id number]) [id number] Page 6 of 10 Wound/Skin Impairment Nursing Diagnoses: Impaired tissue integrity Goals: Patient/caregiver will verbalize understanding of skin care regimen Date Initiated: 07/31/2022 Target Resolution Date: 09/03/2022 Goal Status: Active Interventions: Assess patient/caregiver  ability to obtain necessary supplies Assess patient/caregiver ability to perform ulcer/skin care regimen upon admission and as needed Assess ulceration(s) every visit Provide education on ulcer and skin care Screen for HBO Treatment Activities: Skin care regimen initiated : 07/31/2022 Topical wound management initiated : 07/31/2022 Notes: Electronic Signature(s) Signed: 07/31/2022 12:38:14 PM By: Brenton Grills Entered By: Brenton Grills on 07/31/2022 08:25:29 -------------------------------------------------------------------------------- Pain Assessment Details Patient Name: Date of Service: [name] [name], [name] [name] 07/31/2022 8:00 A M Medical Record Number: [id number] Patient Account Number: [id number] Date of Birth/Sex: Treating RN: 01-10-1932 (89 y.o. [name] [name] Primary Care Adama Ivins: [name], NO Other Clinician: Referring Brooklyn Jeff: Treating Skiler Olden/Extender: [name] in Treatment: 0 Active Problems Location of Pain Severity and Description of Pain Patient Has Paino No Site Locations Pain Management and Medication Current Pain Management: Electronic Signature(s) Signed: 07/31/2022 12:38:14 PM By: Brenton Grills Entered By: Brenton Grills on 07/31/2022 08:24:26 [name] [name] ([id number]) [id number] Page 7 of 10 -------------------------------------------------------------------------------- Patient/Caregiver Education Details Patient Name: Date of Service: [name] [name], [name] [name] 6/27/2024andnbsp8:00 A M Medical Record Number: [id number] Patient Account Number: [id number] Date of Birth/Gender: Treating RN: [birth year] (89 y.o. [name] [name] Primary Care Physician: [name], [name] Other Clinician: Referring Physician: Treating Physician/Extender: [name] in Treatment: 0 Education Assessment Education Provided To: Patient Education Topics Provided Wound/Skin Impairment: Methods:  Explain/Verbal Responses: State content correctly Electronic Signature(s) Signed: 07/31/2022 12:38:14 PM By: Brenton Grills Entered By: Brenton Grills on 07/31/2022 08:28:04 -------------------------------------------------------------------------------- Wound Assessment Details Patient Name: Date  of Service: [name] [name], [name] [name] 07/31/2022 8:00 A M Medical Record Number: [id number] Patient Account Number: [id number] Date of Birth/Sex: Treating RN: 02/11/1931 (89 y.o. [name] [name] Primary Care Que Meneely: [name], NO Other Clinician: Referring Marsa Matteo: Treating Chantille Navarrete/Extender: [name] in Treatment: 0 Wound Status Wound Number: 1 Primary Medication Related Etiology: Wound Location: Right, Anterior T Great oe Wound Open Wounding Event: Gradually Appeared Status: Date Acquired: 06/04/2022 Comorbid Coronary Artery Disease, Hypertension, Peripheral Venous Weeks Of Treatment: 0 History: Disease, Type II Diabetes Clustered Wound: No Photos Wound Measurements [name] [name], [name] [name] ([id number]) Length: (cm) 0.8 Width: (cm) 1 Depth: (cm) 0.1 Area: (cm) 0.628 Volume: (cm) 0.063 [id number] Page 8 of 10 % Reduction in Area: % Reduction in Volume: Tunneling: No Undermining: No Wound Description Classification: Full Thickness Without Exposed Sup Wound Margin: Distinct, outline attached Exudate Amount: Medium Exudate Type: Serosanguineous Exudate Color: red, brown port Structures Wound Bed Granulation Amount: Medium (34-66%) Exposed Structure Granulation Quality: Red, Pink Fat Layer (Subcutaneous Tissue) Exposed: Yes Necrotic Amount: Medium (34-66%) Necrotic Quality: Eschar Periwound Skin Texture Texture Color No Abnormalities Noted: No No Abnormalities Noted: No Scarring: Yes Hemosiderin Staining: Yes Moisture No Abnormalities Noted: No Treatment Notes Wound #1 (Toe Great) Wound Laterality: Right,  Anterior Cleanser Peri-Wound Care Topical Primary Dressing Maxorb Extra Ag+ Alginate Dressing, 2x2 (in/in) Discharge Instruction: Apply to wound bed as instructed Secondary Dressing Optifoam Non-Adhesive Dressing, 4x4 in Discharge Instruction: Apply over primary dressing as directed. Woven Gauze Sponge, Non-Sterile 4x4 in Discharge Instruction: Apply over primary dressing as directed. Secured With Transpore Surgical Tape, 2x10 (in/yd) Discharge Instruction: Secure dressing with tape as directed. Compression Wrap Compression Stockings Add-Ons Electronic Signature(s) Signed: 07/31/2022 12:38:14 PM By: Brenton Grills Entered By: Brenton Grills on 07/31/2022 08:24:13 -------------------------------------------------------------------------------- Wound Assessment Details Patient Name: Date of Service: [name] [name], [facility] N 07/31/2022 8:00 A M Medical Record Number: [id number] Patient Account Number: [id number] Date of Birth/Sex: Treating RN: February 09, 1931 (89 y.o. [name] [name] Primary Care Romyn Boswell: [name], NO Other Clinician: Referring Ciel Chervenak: Treating Allexa Acoff/Extender: Kyra Searles [place], Barnabas Lister ([id number]) [id number] Page 9 of 10 Weeks in Treatment: 0 Wound Status Wound Number: 2 Primary Diabetic Wound/Ulcer of the Lower Extremity Etiology: Wound Location: Right, Plantar T Great oe Wound Open Wounding Event: Gradually Appeared Status: Date Acquired: 06/04/2022 Comorbid Coronary Artery Disease, Hypertension, Peripheral Venous Weeks Of Treatment: 0 History: Disease, Type II Diabetes Clustered Wound: No Photos Wound Measurements Length: (cm) 0.3 Width: (cm) 0.2 Depth: (cm) 0.1 Area: (cm) 0.047 Volume: (cm) 0.005 % Reduction in Area: % Reduction in Volume: Tunneling: No Undermining: No Wound Description Classification: Grade 1 Wound Margin: Distinct, outline attached Exudate Amount: Medium Exudate Type:  Serosanguineous Exudate Color: red, brown Wound Bed Granulation Amount: Medium (34-66%) Exposed Structure Granulation Quality: Pink Fat Layer (Subcutaneous Tissue) Exposed: Yes Necrotic Amount: Medium (34-66%) Necrotic Quality: Eschar Periwound Skin Texture Texture Color No Abnormalities Noted: No No Abnormalities Noted: No Moisture No Abnormalities Noted: No Treatment Notes Wound #2 (Toe Great) Wound Laterality: Plantar, Right Cleanser Peri-Wound Care Topical Primary Dressing Maxorb Extra Ag+ Alginate Dressing, 2x2 (in/in) Discharge Instruction: Apply to wound bed as instructed Secondary Dressing Optifoam Non-Adhesive Dressing, 4x4 in Discharge Instruction: Apply over primary dressing as directed. Woven Gauze Sponge, Non-Sterile 4x4 in Discharge Instruction: Apply over primary dressing as directed. Secured With Transpore Surgical Tape, 2x10 (in/yd) Discharge Instruction: Secure dressing with tape as directed. TOSHIO, SLUSHER ([id number]) [id number] Page 10 of 10 Compression Wrap Compression Stockings Add-Ons Electronic Signature(s) Signed: 07/31/2022  12:38:14 PM By: Brenton Grills Entered By: Brenton Grills on 07/31/2022 08:45:34 -------------------------------------------------------------------------------- Vitals Details Patient Name: Date of Service: [name] [name], [name] [name] [facility] N 07/31/2022 8:00 A M Medical Record Number: [id number] Patient Account Number: [id number] Date of Birth/Sex: Treating RN: 1931-07-04 (89 y.o. M) Primary Care Mystique Bjelland: [name], NO Other Clinician: Referring Faydra Korman: Treating Chonda Baney/Extender: [name] in Treatment: 0 Vital Signs Time Taken: 08:02 Temperature (F): 97.6 Height (in): 73 Pulse (bpm): 79 Weight (lbs): 233 Respiratory Rate (breaths/min): 18 Body Mass Index (BMI): 30.7 Blood Pressure (mmHg): 149/80 Capillary Blood Glucose (mg/dl): 829 Reference Range: 80 - 120 mg /  dl Electronic Signature(s) Signed: 07/31/2022 10:11:18 AM By: Dayton Scrape Entered By: Dayton Scrape on 07/31/2022 08:02:44

## 2022-07-31 NOTE — Progress Notes (Addendum)
DALY, EOFF ([id number]) [id number] Page 1 of 9 Visit Report for 07/31/2022 Chief Complaint Document Details Patient Name: Date of Service: [name] [name], [name] [name] 07/31/2022 8:00 A M Medical Record Number: [id number] Patient Account Number: [id number] Date of Birth/Sex: Treating RN: 07-21-1931 (89 y.o. M) Primary Care Provider: [name], NO Other Clinician: Referring Provider: Treating Provider/Extender: [name] in Treatment: 0 Information Obtained from: Patient Chief Complaint Patients presents for treatment of an open diabetic ulcer of the right great toe Electronic Signature(s) Signed: 07/31/2022 9:04:36 AM By: Duanne Guess MD FACS Entered By: Duanne Guess on 07/31/2022 09:04:36 -------------------------------------------------------------------------------- Debridement Details Patient Name: Date of Service: [name] [name] 07/31/2022 8:00 A M Medical Record Number: [id number] Patient Account Number: [id number] Date of Birth/Sex: Treating RN: 15-Oct-1931 (89 y.o. M) Primary Care Provider: [name], NO Other Clinician: Referring Provider: Treating Provider/Extender: [name] in Treatment: 0 Debridement Performed for Assessment: Wound #1 Right,Anterior T Great oe Performed By: Physician Duanne Guess, MD Debridement Type: Debridement Level of Consciousness (Pre-procedure): Awake and Alert Pre-procedure Verification/Time Out Yes - 08:36 Taken: Start Time: 08:40 Pain Control: Lidocaine 4% Topical Solution Percent of Wound Bed Debrided: 100% T Area Debrided (cm): otal 0.63 Tissue and other material debrided: Non-Viable, Eschar, Slough, Slough Level: Non-Viable Tissue Debridement Description: Selective/Open Wound Instrument: Curette Bleeding: Minimum Hemostasis Achieved: Pressure End Time: 08:42 Procedural Pain: 0 Post Procedural Pain: 0 Response to Treatment: Procedure was  tolerated well Level of Consciousness (Post- Awake and Alert procedure): Post Debridement Measurements of Total Wound Length: (cm) 0.8 Width: (cm) 1 Depth: (cm) 0.1 Volume: (cm) 0.063 Character of Wound/Ulcer Post DebridementVENIAMIN, WEIGMAN ([id number]) [id number] Page 2 of 9 Post Procedure Diagnosis Same as Pre-procedure Notes Scribed for Dr Lady Gary by Brenton Grills RN. Electronic Signature(s) Signed: 07/31/2022 9:04:02 AM By: Duanne Guess MD FACS Entered By: Duanne Guess on 07/31/2022 09:04:01 -------------------------------------------------------------------------------- Debridement Details Patient Name: Date of Service: [name] [name] [facility] [name] 07/31/2022 8:00 A M Medical Record Number: [id number] Patient Account Number: [id number] Date of Birth/Sex: Treating RN: 01-Apr-1931 (89 y.o. M) Primary Care Provider: [name], NO Other Clinician: Referring Provider: Treating Provider/Extender: [name] in Treatment: 0 Debridement Performed for Assessment: Wound #2 Right,Plantar T Great oe Performed By: Physician Duanne Guess, MD Debridement Type: Debridement Severity of Tissue Pre Debridement: Fat layer exposed Level of Consciousness (Pre-procedure): Awake and Alert Pre-procedure Verification/Time Out Yes - 08:36 Taken: Start Time: 08:40 Pain Control: Lidocaine 4% Topical Solution Percent of Wound Bed Debrided: 100% T Area Debrided (cm): otal 0.05 Tissue and other material debrided: Non-Viable, Callus, Slough, Slough Level: Non-Viable Tissue Debridement Description: Selective/Open Wound Instrument: Curette Bleeding: Minimum Hemostasis Achieved: Pressure End Time: 08:42 Procedural Pain: 0 Post Procedural Pain: 0 Response to Treatment: Procedure was tolerated well Level of Consciousness (Post- Awake and Alert procedure): Post Debridement Measurements of Total Wound Length: (cm) 0.3 Width: (cm)  0.2 Depth: (cm) 0.1 Volume: (cm) 0.005 Character of Wound/Ulcer Post Debridement: Stable Severity of Tissue Post Debridement: Fat layer exposed Post Procedure Diagnosis Same as Pre-procedure Notes Scribed for Dr Lady Gary by Brenton Grills RN. Electronic Signature(s) Signed: 07/31/2022 9:04:14 AM By: Duanne Guess MD FACS Entered By: Duanne Guess on 07/31/2022 09:04:14 Thad Ranger ([id number]) [id number] Page 3 of 9 -------------------------------------------------------------------------------- HPI Details Patient Name: Date of Service: [name] [name] 07/31/2022 8:00 A M Medical Record Number: [id number] Patient Account Number: [id number] Date of Birth/Sex: Treating RN: [birth year] (89 y.o. M) Primary Care Provider: [name],  NO Other Clinician: Referring Provider: Treating Provider/Extender: [name] in Treatment: 0 History of Present Illness HPI Description: ADMISSION 07/31/2022 This is an 89 year old poorly controlled type II diabetic (last hemoglobin A1c 8.9%) with peripheral vascular disease, congestive heart failure, and cognitive decline. He presents with a wound on the nailbed of his right great toe, as well as an ulcer on the plantar surface of the same toe. The nailbed wound occurred when he was applying an antifungal medication. He says that at 1 point he just ripped the toenail off. He says this happened about 2 months ago. He has been applying Mercurochrome and Clorox to the site. He says he only noticed the ulcer on the plantar surface of the same toe a couple of days ago. He thinks he wiggles his toes inside his shoes and that caused a friction-related wound. ABI in clinic today was 0.83. Electronic Signature(s) Signed: 07/31/2022 9:06:32 AM By: Duanne Guess MD FACS Entered By: Duanne Guess on 07/31/2022  09:06:32 -------------------------------------------------------------------------------- Physical Exam Details Patient Name: Date of Service: [name] [name] 07/31/2022 8:00 A M Medical Record Number: [id number] Patient Account Number: [id number] Date of Birth/Sex: Treating RN: [birth year] (89 y.o. M) Primary Care Provider: PA Fidela Juneau Other Clinician: Referring Provider: Treating Provider/Extender: [name] in Treatment: 0 Constitutional Slightly hypertensive. . . . No acute distress. Respiratory Normal work of breathing on room air. Notes The nailbed of his right great toe is covered and a crusty eschar and surrounded with callus. Once this was debrided. A small wound was revealed. The fat layer is exposed. No purulent drainage or malodor. The plantar foot wound is covered with callus. Once this was debrided, a small ulcer into the fat layer was revealed. No concern for infection at either site. Electronic Signature(s) Signed: 07/31/2022 9:09:27 AM By: Duanne Guess MD FACS Entered By: Duanne Guess on 07/31/2022 09:09:27 -------------------------------------------------------------------------------- Physician Orders Details Patient Name: Date of Service: RJAY, KIRSHENBAUM [facility] [name] 07/31/2022 8:00 A M Medical Record Number: [id number] Patient Account Number: [id number] Date of Birth/Sex: Treating RN: 06/26/1931 (89 y.o. Rilynn, [place], [place] ([id number]) [id number] Page 4 of 9 Primary Care Provider: PA TIENT, [name] Other Clinician: Referring Provider: Treating Provider/Extender: [name] in Treatment: 0 Verbal / Phone Orders: No Diagnosis Coding ICD-10 Coding Code Description L97.512 Non-pressure chronic ulcer of other part of right foot with fat layer exposed E11.621 Type 2 diabetes mellitus with foot ulcer I73.9 Peripheral vascular disease, unspecified E11.65 Type 2 diabetes  mellitus with hyperglycemia I50.20 Unspecified systolic (congestive) heart failure E66.01 Morbid (severe) obesity due to excess calories Follow-up Appointments Return Appointment in 2 weeks. Bathing/ Shower/ Hygiene May shower with protection but do not get wound dressing(s) wet. Protect dressing(s) with water repellant cover (for example, large plastic bag) or a cast cover and may then take shower. Home Health Admit to Home Health for skilled nursing wound care. May utilize formulary equivalent dressing for wound treatment orders unless otherwise specified. - Dressing change to right great toe daily. Apply silver alginate to wounds and then cover with gauze and tape daily. Foam donut to bottom of great toe to alleviate pressure. Foam donuts sent with patient. Other Home Health Orders/Instructions: - Home Health to visit three times per week. Wound Treatment Wound #1 - T Great oe Wound Laterality: Right, Anterior Prim Dressing: Maxorb Extra Ag+ Alginate Dressing, 2x2 (in/in) (Generic) 1 x Per Day/30 Days ary Discharge Instructions: Apply to wound bed as instructed  Secondary Dressing: Optifoam Non-Adhesive Dressing, 4x4 in (Generic) 1 x Per Day/30 Days Discharge Instructions: Apply over primary dressing as directed. Secondary Dressing: Woven Gauze Sponge, Non-Sterile 4x4 in (Generic) 1 x Per Day/30 Days Discharge Instructions: Apply over primary dressing as directed. Secured With: Transpore Surgical Tape, 2x10 (in/yd) (Generic) 1 x Per Day/30 Days Discharge Instructions: Secure dressing with tape as directed. Wound #2 - T Great oe Wound Laterality: Plantar, Right Prim Dressing: Maxorb Extra Ag+ Alginate Dressing, 2x2 (in/in) (Generic) 1 x Per Day/30 Days ary Discharge Instructions: Apply to wound bed as instructed Secondary Dressing: Optifoam Non-Adhesive Dressing, 4x4 in (Generic) 1 x Per Day/30 Days Discharge Instructions: Apply over primary dressing as directed. Secondary Dressing:  Woven Gauze Sponge, Non-Sterile 4x4 in (Generic) 1 x Per Day/30 Days Discharge Instructions: Apply over primary dressing as directed. Secured With: Transpore Surgical Tape, 2x10 (in/yd) (Generic) 1 x Per Day/30 Days Discharge Instructions: Secure dressing with tape as directed. Electronic Signature(s) Signed: 07/31/2022 12:40:22 PM By: Brenton Grills Signed: 07/31/2022 12:46:10 PM By: Duanne Guess MD FACS Previous Signature: 07/31/2022 9:52:42 AM Version By: Duanne Guess MD FACS Entered By: Brenton Grills on 07/31/2022 12:40:22 -------------------------------------------------------------------------------- Problem List Details Patient Name: Date of Service: TAJAI, LENNARTZ 07/31/2022 8:00 [address] [place] ([id number]) [id number] Page 5 of 9 Medical Record Number: [id number] Patient Account Number: [id number] Date of Birth/Sex: Treating RN: 04/05/1931 (89 y.o. M) Primary Care Provider: [name], NO Other Clinician: Referring Provider: Treating Provider/Extender: [name] in Treatment: 0 Active Problems ICD-10 Encounter Code Description Active Date MDM Diagnosis L97.512 Non-pressure chronic ulcer of other part of right foot with fat layer exposed 07/31/2022 No Yes E11.621 Type 2 diabetes mellitus with foot ulcer 07/31/2022 No Yes I73.9 Peripheral vascular disease, unspecified 07/31/2022 No Yes E11.65 Type 2 diabetes mellitus with hyperglycemia 07/31/2022 No Yes I50.20 Unspecified systolic (congestive) heart failure 07/31/2022 No Yes E66.01 Morbid (severe) obesity due to excess calories 07/31/2022 No Yes Inactive Problems Resolved Problems Electronic Signature(s) Signed: 07/31/2022 9:02:55 AM By: Duanne Guess MD FACS Previous Signature: 07/31/2022 7:46:49 AM Version By: Duanne Guess MD FACS Entered By: Duanne Guess on 07/31/2022  09:02:55 -------------------------------------------------------------------------------- Progress Note Details Patient Name: Date of Service: ARLICE, MAGOUIRK [name] 07/31/2022 8:00 A M Medical Record Number: [id number] Patient Account Number: [id number] Date of Birth/Sex: Treating RN: 10-Sep-1931 (89 y.o. M) Primary Care Provider: [name], NO Other Clinician: Referring Provider: Treating Provider/Extender: [name] in Treatment: 0 Subjective Chief Complaint Information obtained from Patient Patients presents for treatment of an open diabetic ulcer of the right great toe History of Present Illness (HPI) ADMISSION 07/31/2022 This is an 89 year old poorly controlled type II diabetic (last hemoglobin A1c 8.9%) with peripheral vascular disease, congestive heart failure, and cognitive decline. He presents with a wound on the nailbed of his right great toe, as well as an ulcer on the plantar surface of the same toe. The nailbed wound occurred when he was applying an antifungal medication. He says that at 1 point he just ripped the toenail off. He says this happened about 2 months ago. He has been applying Mercurochrome and Clorox to the site. He says he only noticed the ulcer on the plantar surface of the same toe a couple of days ago. He thinks he ZAMEER, AWWAD ([id number]) [id number] Page 6 of 9 wiggles his toes inside his shoes and that caused a friction-related wound. ABI in clinic today was 0.83. Patient History Allergies No Known Allergies Family History  Hypertension - Father, Kidney Disease - Mother. Medical History Cardiovascular Patient has history of Coronary Artery Disease, Hypertension, Peripheral Venous Disease Endocrine Patient has history of Type II Diabetes Hospitalization/Surgery History - Coronary artery bypass. - Prostate surgery. Medical A Surgical History Notes nd Gastrointestinal GERD Oncologic Prostate  cancer Objective Constitutional Slightly hypertensive. No acute distress. Vitals Time Taken: 8:02 AM, Height: 73 in, Weight: 233 lbs, BMI: 30.7, Temperature: 97.6 F, Pulse: 79 bpm, Respiratory Rate: 18 breaths/min, Blood Pressure: 149/80 mmHg, Capillary Blood Glucose: 254 mg/dl. Respiratory Normal work of breathing on room air. General Notes: The nailbed of his right great toe is covered and a crusty eschar and surrounded with callus. Once this was debrided. A small wound was revealed. The fat layer is exposed. No purulent drainage or malodor. The plantar foot wound is covered with callus. Once this was debrided, a small ulcer into the fat layer was revealed. No concern for infection at either site. Integumentary (Hair, Skin) Wound #1 status is Open. Original cause of wound was Gradually Appeared. The date acquired was: 06/04/2022. The wound is located on the Right,Anterior T [employer]. The wound measures 0.8cm length x 1cm width x 0.1cm depth; 0.628cm^2 area and 0.063cm^3 volume. There is Fat Layer (Subcutaneous Tissue) exposed. There is no tunneling or undermining noted. There is a medium amount of serosanguineous drainage noted. The wound margin is distinct with the outline attached to the wound base. There is medium (34-66%) red, pink granulation within the wound bed. There is a medium (34-66%) amount of necrotic tissue within the wound bed including Eschar. The periwound skin appearance exhibited: Scarring, Hemosiderin Staining. Wound #2 status is Open. Original cause of wound was Gradually Appeared. The date acquired was: 06/04/2022. The wound is located on the Right,Plantar T oe Great. The wound measures 0.3cm length x 0.2cm width x 0.1cm depth; 0.047cm^2 area and 0.005cm^3 volume. There is Fat Layer (Subcutaneous Tissue) exposed. There is no tunneling or undermining noted. There is a medium amount of serosanguineous drainage noted. The wound margin is distinct with the outline attached to the  wound base. There is medium (34-66%) pink granulation within the wound bed. There is a medium (34-66%) amount of necrotic tissue within the wound bed including Eschar. Assessment Active Problems ICD-10 Non-pressure chronic ulcer of other part of right foot with fat layer exposed Type 2 diabetes mellitus with foot ulcer Peripheral vascular disease, unspecified Type 2 diabetes mellitus with hyperglycemia Unspecified systolic (congestive) heart failure Morbid (severe) obesity due to excess calories Procedures Wound #1 Pre-procedure diagnosis of Wound #1 is a Medication Related located on the Right,Anterior T Great . There was a Selective/Open Wound Non-Viable Tissue JAHZIER, LALA ([id number]) [id number] Page 7 of 9 Debridement with a total area of 0.63 sq cm performed by Duanne Guess, MD. With the following instrument(s): Curette to remove Non-Viable tissue/material. Material removed includes Eschar and Slough and after achieving pain control using Lidocaine 4% T opical Solution. No specimens were taken. A time out was conducted at 08:36, prior to the start of the procedure. A Minimum amount of bleeding was controlled with Pressure. The procedure was tolerated well with a pain level of 0 throughout and a pain level of 0 following the procedure. Post Debridement Measurements: 0.8cm length x 1cm width x 0.1cm depth; 0.063cm^3 volume. Character of Wound/Ulcer Post Debridement is stable. Post procedure Diagnosis Wound #1: Same as Pre-Procedure General Notes: Scribed for Dr Lady Gary by Brenton Grills RN.[name] Wound #2 Pre-procedure diagnosis of Wound #2 is a  Diabetic Wound/Ulcer of the Lower Extremity located on the Right,Plantar T Great .Severity of Tissue Pre oe Debridement is: Fat layer exposed. There was a Selective/Open Wound Non-Viable Tissue Debridement with a total area of 0.05 sq cm performed by Duanne Guess, MD. With the following instrument(s):  Curette to remove Non-Viable tissue/material. Material removed includes Callus and Slough and after achieving pain control using Lidocaine 4% T opical Solution. No specimens were taken. A time out was conducted at 08:36, prior to the start of the procedure. A Minimum amount of bleeding was controlled with Pressure. The procedure was tolerated well with a pain level of 0 throughout and a pain level of 0 following the procedure. Post Debridement Measurements: 0.3cm length x 0.2cm width x 0.1cm depth; 0.005cm^3 volume. Character of Wound/Ulcer Post Debridement is stable. Severity of Tissue Post Debridement is: Fat layer exposed. Post procedure Diagnosis Wound #2: Same as Pre-Procedure General Notes: Scribed for Dr Lady Gary by Brenton Grills RN.[name] Plan Follow-up Appointments: Return Appointment in 2 weeks. Bathing/ Shower/ Hygiene: May shower with protection but do not get wound dressing(s) wet. Protect dressing(s) with water repellant cover (for example, large plastic bag) or a cast cover and may then take shower. Home Health: Admit to Home Health for skilled nursing wound care. May utilize formulary equivalent dressing for wound treatment orders unless otherwise specified. - Dressing change to right great toe daily. Apply silver alginate to wounds and then cover with gauze and tape daily. Foam donut to bottom of great toe to alleviate pressure. Foam donuts sent with patient. Other Home Health Orders/Instructions: - Home Health to visit three times per week. WOUND #1: - T Great Wound Laterality: Right, Anterior oe Prim Dressing: Maxorb Extra Ag+ Alginate Dressing, 2x2 (in/in) (Generic) 1 x Per Day/30 Days ary Discharge Instructions: Apply to wound bed as instructed Secondary Dressing: Optifoam Non-Adhesive Dressing, 4x4 in (Generic) 1 x Per Day/30 Days Discharge Instructions: Apply over primary dressing as directed. Secondary Dressing: Woven Gauze Sponge, Non-Sterile 4x4 in (Generic) 1 x Per Day/30  Days Discharge Instructions: Apply over primary dressing as directed. Secured With: Transpore Surgical T ape, 2x10 (in/yd) (Generic) 1 x Per Day/30 Days Discharge Instructions: Secure dressing with tape as directed. WOUND #2: - T Great Wound Laterality: Plantar, Right oe Prim Dressing: Maxorb Extra Ag+ Alginate Dressing, 2x2 (in/in) (Generic) 1 x Per Day/30 Days ary Discharge Instructions: Apply to wound bed as instructed Secondary Dressing: Optifoam Non-Adhesive Dressing, 4x4 in (Generic) 1 x Per Day/30 Days Discharge Instructions: Apply over primary dressing as directed. Secondary Dressing: Woven Gauze Sponge, Non-Sterile 4x4 in (Generic) 1 x Per Day/30 Days Discharge Instructions: Apply over primary dressing as directed. Secured With: Transpore Surgical T ape, 2x10 (in/yd) (Generic) 1 x Per Day/30 Days Discharge Instructions: Secure dressing with tape as directed. 07/31/2022: This is a poorly controlled type II diabetic with 2 separate foot ulcers. The nailbed of his right great toe is covered and a crusty eschar and surrounded with callus. Once this was debrided. A small wound was revealed. The fat layer is exposed. No purulent drainage or malodor. The plantar foot wound is covered with callus. Once this was debrided, a small ulcer into the fat layer was revealed. No concern for infection at either site. I used a curette to debride eschar and slough from the nailbed and callus and slough from the plantar surface. We will use silver alginate to both and a foam donut on the plantar surface to help avoid further pressure and friction. I do  not think he would fare well in a forefoot offloading shoe due to concern for falling. We will work on getting him home health to assist with his dressing changes. Due to lack of clinic availability, he will follow-up in 2 weeks. Electronic Signature(s) Signed: 08/01/2022 2:14:44 PM By: Shawn Stall RN, BSN Signed: 08/01/2022 2:47:25 PM By: Duanne Guess  MD FACS Previous Signature: 07/31/2022 9:10:56 AM Version By: Duanne Guess MD FACS Entered By: Shawn Stall on 08/01/2022 14:13:56 -------------------------------------------------------------------------------- HxROS Details Patient Name: Date of Service: BARTEK, FANJOY 07/31/2022 8:00 A M Medical Record Number: [id number] Patient Account Number: [id number] AVELINO, LANOUX ([id number]) [id number] Page 8 of 9 Date of Birth/Sex: Treating RN: [birth year] (89 y.o. [name] Primary Care Provider: [name], NO Other Clinician: Referring Provider: Treating Provider/Extender: [name] in Treatment: 0 Cardiovascular Medical History: Positive for: Coronary Artery Disease; Hypertension; Peripheral Venous Disease Gastrointestinal Medical History: Past Medical History Notes: GERD Endocrine Medical History: Positive for: Type II Diabetes Oncologic Medical History: Past Medical History Notes: Prostate cancer Immunizations Pneumococcal Vaccine: Received Pneumococcal Vaccination: No Implantable Devices No devices added Hospitalization / Surgery History Type of Hospitalization/Surgery Coronary artery bypass Prostate surgery Family and Social History Hypertension: Yes - Father; Kidney Disease: Yes - Mother Electronic Signature(s) Signed: 07/31/2022 9:52:42 AM By: Duanne Guess MD FACS Signed: 07/31/2022 12:38:14 PM By: Brenton Grills Entered By: Brenton Grills on 07/31/2022 07:55:13 -------------------------------------------------------------------------------- SuperBill Details Patient Name: Date of Service: AREN, SAUPE 07/31/2022 Medical Record Number: [id number] Patient Account Number: [id number] Date of Birth/Sex: Treating RN: January 06, 1932 (89 y.o. [name] Primary Care Provider: [name], NO Other Clinician: Referring Provider: Treating Provider/Extender: [name] in Treatment: 0 Diagnosis Coding ICD-10 Codes Code Description [phone] Non-pressure chronic ulcer of other part of right foot with fat layer exposed E11.621 Type 2 diabetes mellitus with foot ulcer I73.9 Peripheral vascular disease, unspecified RAJEEV, WALLETT ([id number]) [id number] Page 9 of 9 E11.65 Type 2 diabetes mellitus with hyperglycemia I50.20 Unspecified systolic (congestive) heart failure E66.01 Morbid (severe) obesity due to excess calories Facility Procedures : CPT4 Code: 46962952 Description: 99204 - WOUND CARE VISIT-LEV 4 NEW PT Modifier: Quantity: 1 : CPT4 Code: [id number] Description: 97597 - DEBRIDE WOUND 1ST 20 SQ CM OR < ICD-10 Diagnosis Description L97.512 Non-pressure chronic ulcer of other part of right foot with fat layer exposed Modifier: Quantity: 1 Physician Procedures : CPT4 Code Description Modifier [id number] 99204 - WC PHYS LEVEL 4 - NEW PT 25 ICD-10 Diagnosis Description L97.512 Non-pressure chronic ulcer of other part of right foot with fat layer exposed E11.621 Type 2 diabetes mellitus with foot ulcer I73.9  Peripheral vascular disease, unspecified E11.65 Type 2 diabetes mellitus with hyperglycemia Quantity: 1 : [id number] 97597 - WC PHYS DEBR WO ANESTH 20 SQ CM ICD-10 Diagnosis Description L97.512 Non-pressure chronic ulcer of other part of right foot with fat layer exposed Quantity: 1 Electronic Signature(s) Signed: 07/31/2022 9:11:39 AM By: Duanne Guess MD FACS Entered By: Duanne Guess on 07/31/2022 09:11:38

## 2022-08-07 ENCOUNTER — Other Ambulatory Visit: Payer: Self-pay | Admitting: Internal Medicine

## 2022-08-07 DIAGNOSIS — E1129 Type 2 diabetes mellitus with other diabetic kidney complication: Secondary | ICD-10-CM

## 2022-08-07 DIAGNOSIS — M109 Gout, unspecified: Secondary | ICD-10-CM

## 2022-08-12 ENCOUNTER — Other Ambulatory Visit: Payer: Self-pay

## 2022-08-12 DIAGNOSIS — E1165 Type 2 diabetes mellitus with hyperglycemia: Secondary | ICD-10-CM

## 2022-08-12 MED ORDER — INSULIN ASPART 100 UNIT/ML IJ SOLN
INTRAMUSCULAR | 2 refills | Status: AC
Start: 2022-08-12 — End: ?

## 2022-08-12 MED ORDER — INSULIN PEN NEEDLE 30G X 8 MM MISC
10.0000 | Freq: Three times a day (TID) | Status: AC
Start: 2022-08-12 — End: ?

## 2022-08-13 ENCOUNTER — Telehealth: Payer: Self-pay

## 2022-08-13 ENCOUNTER — Other Ambulatory Visit: Payer: Self-pay | Admitting: Internal Medicine

## 2022-08-13 DIAGNOSIS — E1165 Type 2 diabetes mellitus with hyperglycemia: Secondary | ICD-10-CM

## 2022-08-13 DIAGNOSIS — E1129 Type 2 diabetes mellitus with other diabetic kidney complication: Secondary | ICD-10-CM

## 2022-08-13 DIAGNOSIS — M109 Gout, unspecified: Secondary | ICD-10-CM

## 2022-08-13 MED ORDER — LANTUS SOLOSTAR 100 UNIT/ML ~~LOC~~ SOPN
14.0000 [IU] | PEN_INJECTOR | Freq: Every day | SUBCUTANEOUS | 99 refills | Status: DC
Start: 2022-08-13 — End: 2022-08-13

## 2022-08-13 MED ORDER — INSULIN GLARGINE 100 UNIT/ML ~~LOC~~ SOLN
8.0000 [IU] | Freq: Every day | SUBCUTANEOUS | 11 refills | Status: DC
Start: 2022-08-13 — End: 2022-10-15

## 2022-08-13 MED ORDER — INSULIN LISPRO 100 UNIT/ML IJ SOLN
8.0000 [IU] | Freq: Three times a day (TID) | INTRAMUSCULAR | 11 refills | Status: DC
Start: 2022-08-13 — End: 2022-10-15

## 2022-08-13 NOTE — Telephone Encounter (Signed)
Antwine Agosto Ann Maripaz Mullan, CMA  ?

## 2022-08-14 ENCOUNTER — Encounter (HOSPITAL_BASED_OUTPATIENT_CLINIC_OR_DEPARTMENT_OTHER): Payer: Medicare Other | Attending: General Surgery | Admitting: General Surgery

## 2022-08-14 DIAGNOSIS — E11621 Type 2 diabetes mellitus with foot ulcer: Secondary | ICD-10-CM | POA: Diagnosis not present

## 2022-08-14 DIAGNOSIS — E1151 Type 2 diabetes mellitus with diabetic peripheral angiopathy without gangrene: Secondary | ICD-10-CM | POA: Insufficient documentation

## 2022-08-14 DIAGNOSIS — E1165 Type 2 diabetes mellitus with hyperglycemia: Secondary | ICD-10-CM | POA: Diagnosis not present

## 2022-08-14 DIAGNOSIS — I739 Peripheral vascular disease, unspecified: Secondary | ICD-10-CM | POA: Insufficient documentation

## 2022-08-14 DIAGNOSIS — I110 Hypertensive heart disease with heart failure: Secondary | ICD-10-CM | POA: Diagnosis not present

## 2022-08-14 DIAGNOSIS — L97512 Non-pressure chronic ulcer of other part of right foot with fat layer exposed: Secondary | ICD-10-CM | POA: Diagnosis present

## 2022-08-14 DIAGNOSIS — I5022 Chronic systolic (congestive) heart failure: Secondary | ICD-10-CM | POA: Diagnosis not present

## 2022-08-14 DIAGNOSIS — Z6830 Body mass index (BMI) 30.0-30.9, adult: Secondary | ICD-10-CM | POA: Insufficient documentation

## 2022-08-14 NOTE — Progress Notes (Signed)
[name] [name] ([id number]) [id number] Page 1 of 8 Visit Report for 08/14/2022 Chief Complaint Document Details Patient Name: Date of Service: [name] [name], [name] [name] 08/14/2022 11:45 A M Medical Record Number: [id number] Patient Account Number: [id number] Date of Birth/Sex: Treating RN: 27-Oct-1931 (89 y.o. M) Primary Care Provider: [name], NO Other Clinician: Referring Provider: Treating Provider/Extender: [name] in Treatment: 2 Information Obtained from: Patient Chief Complaint Patients presents for treatment of an open diabetic ulcer of the right great toe Electronic Signature(s) Signed: 08/14/2022 12:00:47 PM By: Duanne Guess MD FACS Entered By: Duanne Guess on 08/14/2022 12:00:47 -------------------------------------------------------------------------------- Debridement Details Patient Name: Date of Service: [name] [name], [name] [name] 08/14/2022 11:45 A M Medical Record Number: [id number] Patient Account Number: [id number] Date of Birth/Sex: Treating RN: 04-May-1931 (89 y.o. M) Primary Care Provider: PA TIENT, NO Other Clinician: Referring Provider: Treating Provider/Extender: [name] in Treatment: 2 Debridement Performed for Assessment: Wound #2 Right,Plantar T Great oe Performed By: Physician Duanne Guess, MD Debridement Type: Debridement Severity of Tissue Pre Debridement: Fat layer exposed Level of Consciousness (Pre-procedure): Awake and Alert Pre-procedure Verification/Time Out Yes - 11:52 Taken: Start Time: 11:55 Pain Control: Lidocaine 4% Topical Solution Percent of Wound Bed Debrided: 100% T Area Debrided (cm): otal 0.07 Tissue and other material debrided: Non-Viable, Eschar, Slough, Slough Level: Non-Viable Tissue Debridement Description: Selective/Open Wound Instrument: Curette Bleeding: Minimum Hemostasis Achieved: Pressure End Time: 11:58 Procedural Pain: 0 Post  Procedural Pain: 0 Response to Treatment: Procedure was tolerated well Level of Consciousness (Post- Awake and Alert procedure): Post Debridement Measurements of Total Wound Length: (cm) 0.3 Width: (cm) 0.3 Depth: (cm) 0.1 Volume: (cm) 0.007 Character of Wound/Ulcer Post Debridement: Improved [name] [name] ([id number]) [id number] Page 2 of 8 Severity of Tissue Post Debridement: Fat layer exposed Post Procedure Diagnosis Same as Pre-procedure Notes Scribed for Dr Lady Gary by Brenton Grills, RN Electronic Signature(s) Signed: 08/14/2022 12:00:36 PM By: Duanne Guess MD FACS Entered By: Duanne Guess on 08/14/2022 12:00:36 -------------------------------------------------------------------------------- HPI Details Patient Name: Date of Service: [name] [name], [name] [name] 08/14/2022 11:45 A M Medical Record Number: [id number] Patient Account Number: [id number] Date of Birth/Sex: Treating RN: Jul 17, 1931 (89 y.o. M) Primary Care Provider: [name], NO Other Clinician: Referring Provider: Treating Provider/Extender: [name] in Treatment: 2 History of Present Illness HPI Description: ADMISSION 07/31/2022 This is an 89 year old poorly controlled type II diabetic (last hemoglobin A1c 8.9%) with peripheral vascular disease, congestive heart failure, and cognitive decline. He presents with a wound on the nailbed of his right great toe, as well as an ulcer on the plantar surface of the same toe. The nailbed wound occurred when he was applying an antifungal medication. He says that at 1 point he just ripped the toenail off. He says this happened about 2 months ago. He has been applying Mercurochrome and Clorox to the site. He says he only noticed the ulcer on the plantar surface of the same toe a couple of days ago. He thinks he wiggles his toes inside his shoes and that caused a friction-related wound. ABI in clinic today was  0.83. 08/14/2022: For some reason, the home health that we had ordered did not follow through and he has not been getting any wound care at home. He has apparently been treating his wounds with his own concoctions. Fortunately, both wounds seem to have improved. They are smaller. There is callus accumulation around the wound on his dorsal great toe and the plantar great toe wound is  very superficial with just a little bit of dry eschar and slough present. Electronic Signature(s) Signed: 08/14/2022 12:04:24 PM By: Duanne Guess MD FACS Entered By: Duanne Guess on 08/14/2022 12:04:23 -------------------------------------------------------------------------------- Physical Exam Details Patient Name: Date of Service: [name] [name], [name] [name] 08/14/2022 11:45 A M Medical Record Number: [id number] Patient Account Number: [id number] Date of Birth/Sex: Treating RN: Mar 03, 1931 (89 y.o. M) Primary Care Provider: PA Fidela Juneau Other Clinician: Referring Provider: Treating Provider/Extender: [name] in Treatment: 2 Constitutional Hypertensive, asymptomatic. . . . no acute distress. Respiratory Normal work of breathing on room air. Notes 08/14/2022: Both wounds seem to have improved. They are smaller. There is callus accumulation around the wound on his dorsal great toe and the plantar great toe wound is very superficial with just a little bit of dry eschar and slough present. [name] [name] ([id number]) [id number] Page 3 of 8 Electronic Signature(s) Signed: 08/14/2022 12:05:09 PM By: Duanne Guess MD FACS Entered By: Duanne Guess on 08/14/2022 12:05:09 -------------------------------------------------------------------------------- Physician Orders Details Patient Name: Date of Service: [name] [name], [name] [name] 08/14/2022 11:45 A M Medical Record Number: [id number] Patient Account Number: [id number] Date of Birth/Sex: Treating  RN: Aug 09, 1931 (89 y.o. [name] Primary Care Provider: [name], NO Other Clinician: Referring Provider: Treating Provider/Extender: [name] in Treatment: 2 Verbal / Phone Orders: No Diagnosis Coding ICD-10 Coding Code Description [phone] Non-pressure chronic ulcer of other part of right foot with fat layer exposed E11.621 Type 2 diabetes mellitus with foot ulcer I73.9 Peripheral vascular disease, unspecified E11.65 Type 2 diabetes mellitus with hyperglycemia I50.20 Unspecified systolic (congestive) heart failure E66.01 Morbid (severe) obesity due to excess calories Follow-up Appointments Return Appointment in 2 weeks. Bathing/ Shower/ Hygiene May shower with protection but do not get wound dressing(s) wet. Protect dressing(s) with water repellant cover (for example, large plastic bag) or a cast cover and may then take shower. Home Health Admit to Home Health for skilled nursing wound care. May utilize formulary equivalent dressing for wound treatment orders unless otherwise specified. - Dressing change to right great toe daily. Apply silver alginate to wounds and then cover with gauze and tape daily. Foam donut to bottom of great toe to alleviate pressure. Foam donuts sent with patient. Other Home Health Orders/Instructions: - Home Health to visit three times per week. Wound Treatment Wound #1 - T Great oe Wound Laterality: Right, Anterior Prim Dressing: Maxorb Extra Ag+ Alginate Dressing, 2x2 (in/in) (Generic) 1 x Per Day/30 Days ary Discharge Instructions: Apply to wound bed as instructed Secondary Dressing: Optifoam Non-Adhesive Dressing, 4x4 in (Generic) 1 x Per Day/30 Days Discharge Instructions: Apply over primary dressing as directed. Secondary Dressing: Woven Gauze Sponge, Non-Sterile 4x4 in (Generic) 1 x Per Day/30 Days Discharge Instructions: Apply over primary dressing as directed. Secured With: Transpore Surgical Tape, 2x10 (in/yd)  (Generic) 1 x Per Day/30 Days Discharge Instructions: Secure dressing with tape as directed. Wound #2 - T Great oe Wound Laterality: Plantar, Right Prim Dressing: Maxorb Extra Ag+ Alginate Dressing, 2x2 (in/in) (Generic) 1 x Per Day/30 Days ary Discharge Instructions: Apply to wound bed as instructed Secondary Dressing: Optifoam Non-Adhesive Dressing, 4x4 in (Generic) 1 x Per Day/30 Days Discharge Instructions: Apply over primary dressing as directed. Secondary Dressing: Woven Gauze Sponge, Non-Sterile 4x4 in (Generic) 1 x Per Day/30 Days Discharge Instructions: Apply over primary dressing as directed. Secured With: Transpore Surgical Tape, 2x10 (in/yd) (Generic) 1 x Per Day/30 Days Discharge Instructions: Secure dressing with tape as directed. [name] [name],  [name] [name] ([id number]) [id number] Page 4 of 8 Electronic Signature(s) Signed: 08/14/2022 12:08:46 PM By: Duanne Guess MD FACS Entered By: Duanne Guess on 08/14/2022 12:05:22 -------------------------------------------------------------------------------- Problem List Details Patient Name: Date of Service: JAYDAN, MEIDINGER 08/14/2022 11:45 A M Medical Record Number: [id number] Patient Account Number: [id number] Date of Birth/Sex: Treating RN: 08/06/1931 (89 y.o. [name] Primary Care Provider: [name], NO Other Clinician: Referring Provider: Treating Provider/Extender: [name] in Treatment: 2 Active Problems ICD-10 Encounter Code Description Active Date MDM Diagnosis L97.512 Non-pressure chronic ulcer of other part of right foot with fat layer exposed 07/31/2022 No Yes E11.621 Type 2 diabetes mellitus with foot ulcer 07/31/2022 No Yes I73.9 Peripheral vascular disease, unspecified 07/31/2022 No Yes E11.65 Type 2 diabetes mellitus with hyperglycemia 07/31/2022 No Yes I50.20 Unspecified systolic (congestive) heart failure 07/31/2022 No Yes E66.01 Morbid (severe)  obesity due to excess calories 07/31/2022 No Yes Inactive Problems Resolved Problems Electronic Signature(s) Signed: 08/14/2022 12:00:13 PM By: Duanne Guess MD FACS Entered By: Duanne Guess on 08/14/2022 12:00:13 -------------------------------------------------------------------------------- Progress Note Details Patient Name: Date of Service: [name] [name], [name] [name] 08/14/2022 11:45 A M Medical Record Number: [id number] Patient Account Number: [id number] Date of Birth/Sex: Treating RN: 24-Aug-1931 (89 y.o. Arliss Journey, [name] [name] ([id number]) [id number] Page 5 of 8 Primary Care Provider: [name], [name] Other Clinician: Referring Provider: Treating Provider/Extender: [name] in Treatment: 2 Subjective Chief Complaint Information obtained from Patient Patients presents for treatment of an open diabetic ulcer of the right great toe History of Present Illness (HPI) ADMISSION 07/31/2022 This is an 89 year old poorly controlled type II diabetic (last hemoglobin A1c 8.9%) with peripheral vascular disease, congestive heart failure, and cognitive decline. He presents with a wound on the nailbed of his right great toe, as well as an ulcer on the plantar surface of the same toe. The nailbed wound occurred when he was applying an antifungal medication. He says that at 1 point he just ripped the toenail off. He says this happened about 2 months ago. He has been applying Mercurochrome and Clorox to the site. He says he only noticed the ulcer on the plantar surface of the same toe a couple of days ago. He thinks he wiggles his toes inside his shoes and that caused a friction-related wound. ABI in clinic today was 0.83. 08/14/2022: For some reason, the home health that we had ordered did not follow through and he has not been getting any wound care at home. He has apparently been treating his wounds with his own concoctions. Fortunately, both wounds  seem to have improved. They are smaller. There is callus accumulation around the wound on his dorsal great toe and the plantar great toe wound is very superficial with just a little bit of dry eschar and slough present. Patient History Family History Hypertension - Father, Kidney Disease - Mother. Medical History Cardiovascular Patient has history of Coronary Artery Disease, Hypertension, Peripheral Venous Disease Endocrine Patient has history of Type II Diabetes Hospitalization/Surgery History - Coronary artery bypass. - Prostate surgery. Medical A Surgical History Notes nd Gastrointestinal GERD Oncologic Prostate cancer Objective Constitutional Hypertensive, asymptomatic. no acute distress. Vitals Time Taken: 11:30 AM, Height: 73 in, Weight: 233 lbs, BMI: 30.7, Temperature: 97.6 F, Pulse: 76 bpm, Respiratory Rate: 18 breaths/min, Blood Pressure: 150/82 mmHg, Capillary Blood Glucose: 222 mg/dl. Respiratory Normal work of breathing on room air. General Notes: 08/14/2022: Both wounds seem to have improved. They are smaller. There is callus accumulation around the wound  on his dorsal great toe and the plantar great toe wound is very superficial with just a little bit of dry eschar and slough present. Integumentary (Hair, Skin) Wound #1 status is Open. Original cause of wound was Gradually Appeared. The date acquired was: 06/04/2022. The wound has been in treatment 2 weeks. The wound is located on the [employer]. The wound measures 0.2cm length x 0.2cm width x 0.1cm depth; 0.031cm^2 area and 0.003cm^3 volume. oe There is Fat Layer (Subcutaneous Tissue) exposed. There is no tunneling or undermining noted. There is a medium amount of serosanguineous drainage noted. The wound margin is distinct with the outline attached to the wound base. There is medium (34-66%) red, pink granulation within the wound bed. There is a medium (34-66%) amount of necrotic tissue within the wound bed  including Eschar. The periwound skin appearance exhibited: Scarring, Hemosiderin Staining. Wound #2 status is Open. Original cause of wound was Gradually Appeared. The date acquired was: 06/04/2022. The wound has been in treatment 2 weeks. The wound is located on the [employer]. The wound measures 0.3cm length x 0.3cm width x 0.1cm depth; 0.071cm^2 area and 0.007cm^3 volume. oe There is Fat Layer (Subcutaneous Tissue) exposed. There is no tunneling or undermining noted. There is a medium amount of serosanguineous drainage noted. The wound margin is distinct with the outline attached to the wound base. There is medium (34-66%) pink granulation within the wound bed. There is a medium (34-66%) amount of necrotic tissue within the wound bed including Eschar. [address] [name] [name], [name] [name] ([id number]) [id number] Page 6 of 8 Active Problems ICD-10 Non-pressure chronic ulcer of other part of right foot with fat layer exposed Type 2 diabetes mellitus with foot ulcer Peripheral vascular disease, unspecified Type 2 diabetes mellitus with hyperglycemia Unspecified systolic (congestive) heart failure Morbid (severe) obesity due to excess calories Procedures Wound #2 Pre-procedure diagnosis of Wound #2 is a Diabetic Wound/Ulcer of the Lower Extremity located on the Right,Plantar T Great .Severity of Tissue Pre oe Debridement is: Fat layer exposed. There was a Selective/Open Wound Non-Viable Tissue Debridement with a total area of 0.07 sq cm performed by Duanne Guess, MD. With the following instrument(s): Curette to remove Non-Viable tissue/material. Material removed includes Eschar and Slough and after achieving pain control using Lidocaine 4% T opical Solution. No specimens were taken. A time out was conducted at 11:52, prior to the start of the procedure. A Minimum amount of bleeding was controlled with Pressure. The procedure was tolerated well with a pain  level of 0 throughout and a pain level of 0 following the procedure. Post Debridement Measurements: 0.3cm length x 0.3cm width x 0.1cm depth; 0.007cm^3 volume. Character of Wound/Ulcer Post Debridement is improved. Severity of Tissue Post Debridement is: Fat layer exposed. Post procedure Diagnosis Wound #2: Same as Pre-Procedure General Notes: Scribed for Dr Lady Gary by Brenton Grills, RN. Plan Follow-up Appointments: Return Appointment in 2 weeks. Bathing/ Shower/ Hygiene: May shower with protection but do not get wound dressing(s) wet. Protect dressing(s) with water repellant cover (for example, large plastic bag) or a cast cover and may then take shower. Home Health: Admit to Home Health for skilled nursing wound care. May utilize formulary equivalent dressing for wound treatment orders unless otherwise specified. - Dressing change to right great toe daily. Apply silver alginate to wounds and then cover with gauze and tape daily. Foam donut to bottom of great toe to alleviate pressure. Foam donuts sent with patient. Other Home Health Orders/Instructions: - Home Health to  visit three times per week. WOUND #1: - T Great Wound Laterality: Right, Anterior oe Prim Dressing: Maxorb Extra Ag+ Alginate Dressing, 2x2 (in/in) (Generic) 1 x Per Day/30 Days ary Discharge Instructions: Apply to wound bed as instructed Secondary Dressing: Optifoam Non-Adhesive Dressing, 4x4 in (Generic) 1 x Per Day/30 Days Discharge Instructions: Apply over primary dressing as directed. Secondary Dressing: Woven Gauze Sponge, Non-Sterile 4x4 in (Generic) 1 x Per Day/30 Days Discharge Instructions: Apply over primary dressing as directed. Secured With: Transpore Surgical T ape, 2x10 (in/yd) (Generic) 1 x Per Day/30 Days Discharge Instructions: Secure dressing with tape as directed. WOUND #2: - T Great Wound Laterality: Plantar, Right oe Prim Dressing: Maxorb Extra Ag+ Alginate Dressing, 2x2 (in/in) (Generic) 1 x Per  Day/30 Days ary Discharge Instructions: Apply to wound bed as instructed Secondary Dressing: Optifoam Non-Adhesive Dressing, 4x4 in (Generic) 1 x Per Day/30 Days Discharge Instructions: Apply over primary dressing as directed. Secondary Dressing: Woven Gauze Sponge, Non-Sterile 4x4 in (Generic) 1 x Per Day/30 Days Discharge Instructions: Apply over primary dressing as directed. Secured With: Transpore Surgical T ape, 2x10 (in/yd) (Generic) 1 x Per Day/30 Days Discharge Instructions: Secure dressing with tape as directed. 08/14/2022: Both wounds seem to have improved. They are smaller. There is callus accumulation around the wound on his dorsal great toe and the plantar great toe wound is very superficial with just a little bit of dry eschar and slough present. I used a curette to debride slough and eschar from the plantar foot wound and callus and slough from the dorsal foot wound. We will contact [facility] to find out what is going on with his home health assistance. He will continue silver alginate to both sites with a foam donut under the plantar wound. He was instructed to not take matters into his own hands as far as applying Clorox or other inadvisable substances to his wound. Follow-up in 1 week. Electronic Signature(s) Signed: 08/14/2022 12:07:45 PM By: Duanne Guess MD FACS Entered By: Duanne Guess on 08/14/2022 12:07:45 Thad Ranger ([id number]) [id number] Page 7 of 8 -------------------------------------------------------------------------------- HxROS Details Patient Name: Date of Service: [name] [name], [name] [name] 08/14/2022 11:45 A M Medical Record Number: [id number] Patient Account Number: [id number] Date of Birth/Sex: Treating RN: 1931/03/06 (89 y.o. M) Primary Care Provider: [name], NO Other Clinician: Referring Provider: Treating Provider/Extender: [name] in Treatment: 2 Cardiovascular Medical  History: Positive for: Coronary Artery Disease; Hypertension; Peripheral Venous Disease Gastrointestinal Medical History: Past Medical History Notes: GERD Endocrine Medical History: Positive for: Type II Diabetes Oncologic Medical History: Past Medical History Notes: Prostate cancer Immunizations Pneumococcal Vaccine: Received Pneumococcal Vaccination: No Implantable Devices No devices added Hospitalization / Surgery History Type of Hospitalization/Surgery Coronary artery bypass Prostate surgery Family and Social History Hypertension: Yes - Father; Kidney Disease: Yes - Mother Electronic Signature(s) Signed: 08/14/2022 12:08:46 PM By: Duanne Guess MD FACS Entered By: Duanne Guess on 08/14/2022 12:04:29 -------------------------------------------------------------------------------- SuperBill Details Patient Name: Date of Service: [name] [name], [name] [name] 08/14/2022 Medical Record Number: [id number] Patient Account Number: [id number] Date of Birth/Sex: Treating RN: Jul 03, 1931 (89 y.o. [name] Primary Care Provider: [name], NO Other Clinician: Referring Provider: Treating Provider/Extender: [name] in Treatment: 2 [place], Laura ([id number]) [id number] Page 8 of 8 Diagnosis Coding ICD-10 Codes Code Description [phone] Non-pressure chronic ulcer of other part of right foot with fat layer exposed E11.621 Type 2 diabetes mellitus with foot ulcer I73.9 Peripheral vascular disease, unspecified E11.65 Type 2 diabetes mellitus with hyperglycemia  I50.20 Unspecified systolic (congestive) heart failure E66.01 Morbid (severe) obesity due to excess calories Facility Procedures : CPT4 Code: [id number] Description: 97597 - DEBRIDE WOUND 1ST 20 SQ CM OR < ICD-10 Diagnosis Description L97.512 Non-pressure chronic ulcer of other part of right foot with fat layer exposed Modifier: Quantity: 1 Physician  Procedures : CPT4 Code Description Modifier [id number] - WC PHYS LEVEL 3 - EST PT 25 ICD-10 Diagnosis Description L97.512 Non-pressure chronic ulcer of other part of right foot with fat layer exposed E11.621 Type 2 diabetes mellitus with foot ulcer E11.65 Type 2  diabetes mellitus with hyperglycemia I73.9 Peripheral vascular disease, unspecified Quantity: 1 : [id number] 97597 - WC PHYS DEBR WO ANESTH 20 SQ CM ICD-10 Diagnosis Description L97.512 Non-pressure chronic ulcer of other part of right foot with fat layer exposed Quantity: 1 Electronic Signature(s) Signed: 08/14/2022 12:08:07 PM By: Duanne Guess MD FACS Entered By: Duanne Guess on 08/14/2022 12:08:07

## 2022-08-20 ENCOUNTER — Ambulatory Visit (HOSPITAL_BASED_OUTPATIENT_CLINIC_OR_DEPARTMENT_OTHER): Payer: Medicare Other | Admitting: General Surgery

## 2022-08-20 NOTE — Progress Notes (Signed)
[name] [name] ([id number]) [id number] Page 1 of 8 Visit Report for 08/14/2022 Arrival Information Details Patient Name: Date of Service: [name] [name], [name] [name] 08/14/2022 11:45 A M Medical Record Number: [id number] Patient Account Number: [id number] Date of Birth/Sex: Treating RN: Feb 06, 1931 (89 y.o. [name] [name] Primary Care [name] [name]: PA [name] [name], NO Other Clinician: Referring [name] [name]: Treating [name] [name]/Extender: [name] in Treatment: 2 Visit Information History Since Last Visit All ordered tests and consults were completed: Yes Patient Arrived: Ambulatory Added or deleted any medications: No Arrival Time: 11:27 Any new allergies or adverse reactions: No Accompanied By: daughter Had a fall or experienced change in No Transfer Assistance: None activities of daily living that may affect Patient Identification Verified: Yes risk of falls: Secondary Verification Process Completed: Yes Signs or symptoms of abuse/neglect since last visito No Patient Requires Transmission-Based Precautions: No Hospitalized since last visit: No Patient Has Alerts: No Implantable device outside of the clinic excluding No cellular tissue based products placed in the center since last visit: Has Dressing in Place as Prescribed: Yes Pain Present Now: No Electronic Signature(s) Signed: 08/20/2022 7:59:22 AM By: [name] [name] Entered By: [name] [name] on 08/14/2022 11:29:31 -------------------------------------------------------------------------------- Encounter Discharge Information Details Patient Name: Date of Service: [name] [name] [facility] N 08/14/2022 11:45 A M Medical Record Number: [id number] Patient Account Number: [id number] Date of Birth/Sex: Treating RN: 09-03-1931 (89 y.o. [name] [name] Primary Care [name] [name]: PA [name] [name], NO Other Clinician: Referring [name] [name]: Treating [name] [name]/Extender: [name] in  Treatment: 2 Encounter Discharge Information Items Post Procedure Vitals Discharge Condition: Stable Temperature (F): 97.9 Ambulatory Status: Ambulatory Pulse (bpm): 68 Discharge Destination: Home Respiratory Rate (breaths/min): 18 Transportation: Private Auto Blood Pressure (mmHg): 138/70 Accompanied By: family Schedule Follow-up Appointment: Yes Clinical Summary of Care: Electronic Signature(s) Signed: 08/20/2022 7:59:22 AM By: [name] [name] Entered By: [name] [name] on 08/14/2022 12:15:35 [name] [name] ([id number]) [id number] Page 2 of 8 -------------------------------------------------------------------------------- Lower Extremity Assessment Details Patient Name: Date of Service: [name] [name] 08/14/2022 11:45 A M Medical Record Number: [id number] Patient Account Number: [id number] Date of Birth/Sex: Treating RN: January 23, 1932 (89 y.o. [name] [name] Primary Care [name] [name]: PA [name] [name], NO Other Clinician: Referring [name] [name]: Treating [name] [name]/Extender: [name] in Treatment: 2 Edema Assessment Assessed: [Left: No] [Right: No] [Left: Edema] [Right: :] Calf Left: Right: Point of Measurement: From Medial Instep 39 cm Ankle Left: Right: Point of Measurement: From Medial Instep 23.5 cm Vascular Assessment Pulses: Dorsalis Pedis Palpable: [Right:Yes] Extremity colors, hair growth, and conditions: Hair Growth on Extremity: [Right:Yes] Temperature of Extremity: [Right:Warm] Dependent Rubor: [Right:No] Blanched when Elevated: [Right:No No] Toe Nail Assessment Left: Right: Thick: Yes Discolored: Yes Deformed: Yes Improper Length and Hygiene: Yes Electronic Signature(s) Signed: 08/20/2022 7:59:22 AM By: [name] [name] Entered By: [name] [name] on 08/14/2022 11:36:25 -------------------------------------------------------------------------------- Multi Wound Chart Details Patient Name: Date of  Service: [name] [name] [facility] N 08/14/2022 11:45 A M Medical Record Number: [id number] Patient Account Number: [id number] Date of Birth/Sex: Treating RN: 1931/04/23 (89 y.o. M) Primary Care [name] [name]: PA [name] [name], NO Other Clinician: Referring [name] [name]: Treating [name] [name]/Extender: [name] in Treatment: 2 Vital Signs Height(in): 73 Capillary Blood Glucose(mg/dl): 536 Weight(lbs): 644 Pulse(bpm): 76 Body Mass Index(BMI): 30.7 Blood Pressure(mmHg): 150/82 Temperature(F): 97.6 [name] [name], [name] [name] ([id number]) [id number] Page 3 of 8 Respiratory Rate(breaths/min): 18 [1:Photos:] [N/A:N/A] Right, Anterior T Great oe Right, Plantar T Great oe N/A Wound Location: Gradually Appeared Gradually Appeared N/A Wounding Event: Medication 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 Primary Etiology:  Extremity Coronary Artery Disease, Coronary Artery Disease, N/A Comorbid History: Hypertension, Peripheral Venous Hypertension, Peripheral Venous Disease, Type II Diabetes Disease, Type II Diabetes 06/04/2022 06/04/2022 N/A Date Acquired: 2 2 N/A Weeks of Treatment: Open Open N/A Wound Status: No No N/A Wound Recurrence: 0.2x0.2x0.1 0.3x0.3x0.1 N/A Measurements L x W x D (cm) 0.031 0.071 N/A A (cm) : rea 0.003 0.007 N/A Volume (cm) : 95.10% -51.10% N/A % Reduction in A rea: 95.20% -40.00% N/A % Reduction in Volume: Full Thickness Without Exposed Grade 1 N/A Classification: Support Structures Medium Medium N/A Exudate A mount: Serosanguineous Serosanguineous N/A Exudate Type: red, brown red, brown N/A Exudate Color: Distinct, outline attached Distinct, outline attached N/A Wound Margin: Medium (34-66%) Medium (34-66%) N/A Granulation A mount: Red, Pink Pink N/A Granulation Quality: Medium (34-66%) Medium (34-66%) N/A Necrotic A mount: Eschar Eschar N/A Necrotic Tissue: Fat Layer (Subcutaneous Tissue): Yes Fat Layer  (Subcutaneous Tissue): Yes N/A Exposed Structures: N/A Debridement - Selective/Open Wound N/A Debridement: Pre-procedure Verification/Time Out N/A 11:52 N/A Taken: N/A Lidocaine 4% Topical Solution N/A Pain Control: N/A Necrotic/Eschar, Slough N/A Tissue Debrided: N/A Non-Viable Tissue N/A Level: N/A 0.07 N/A Debridement A (sq cm): rea N/A Curette N/A Instrument: N/A Minimum N/A Bleeding: N/A Pressure N/A Hemostasis A chieved: N/A 0 N/A Procedural Pain: N/A 0 N/A Post Procedural Pain: N/A Procedure was tolerated well N/A Debridement Treatment Response: N/A 0.3x0.3x0.1 N/A Post Debridement Measurements L x W x D (cm) N/A 0.007 N/A Post Debridement Volume: (cm) Scarring: Yes N/A Periwound Skin Texture: Hemosiderin Staining: Yes N/A Periwound Skin Color: N/A Debridement N/A Procedures Performed: Treatment Notes Electronic Signature(s) Signed: 08/14/2022 12:00:21 PM By: Duanne Guess MD FACS Entered By: Duanne Guess on 08/14/2022 12:00:21 -------------------------------------------------------------------------------- Multi-Disciplinary Care Plan Details Patient Name: Date of Service: [name] [name], [name] [name] [facility] N 08/14/2022 11:45 A M Medical Record Number: [id number] Patient Account Number: [id number] Date of Birth/Sex: Treating RN: 10/24/1931 (89 y.o. [place]iss, [place], [place] ([id number]) [id number] Page 4 of 8 Primary Care Brees Hounshell: PA [name] [name], [name] Other Clinician: Referring Torey Reinard: Treating Tuwanda Vokes/Extender: [name] in Treatment: 2 Active Inactive Wound/Skin Impairment Nursing Diagnoses: Impaired tissue integrity Goals: Patient/caregiver will verbalize understanding of skin care regimen Date Initiated: 07/31/2022 Target Resolution Date: 09/03/2022 Goal Status: Active Interventions: Assess patient/caregiver ability to obtain necessary supplies Assess patient/caregiver ability to perform  ulcer/skin care regimen upon admission and as needed Assess ulceration(s) every visit Provide education on ulcer and skin care Screen for HBO Treatment Activities: Skin care regimen initiated : 07/31/2022 Topical wound management initiated : 07/31/2022 Notes: Electronic Signature(s) Signed: 08/20/2022 7:59:22 AM By: [name] [name] Entered By: [name] [name] on 08/14/2022 11:47:49 -------------------------------------------------------------------------------- Pain Assessment Details Patient Name: Date of Service: [name] [name], [name] [name] 08/14/2022 11:45 A M Medical Record Number: [id number] Patient Account Number: [id number] Date of Birth/Sex: Treating RN: 10-12-1931 (89 y.o. [name] [name] Primary Care Lennan Malone: PA [name] [name], NO Other Clinician: Referring Dez Stauffer: Treating Ponce Skillman/Extender: [name] in Treatment: 2 Active Problems Location of Pain Severity and Description of Pain Patient Has Paino No Site Locations [place], [place] ([id number]) [id number] Page 5 of 8 Pain Management and Medication Current Pain Management: Electronic Signature(s) Signed: 08/20/2022 7:59:22 AM By: [name] [name] Entered By: [name] [name] on 08/14/2022 11:32:34 -------------------------------------------------------------------------------- Patient/Caregiver Education Details Patient Name: Date of Service: MACSEN, NUTTALL 7/11/2024andnbsp11:45 A M Medical Record Number: [id number] Patient Account Number: [id number] Date of Birth/Gender: Treating RN: February 10, 1931 (89 y.o. [name] [name] Primary Care Physician: PA [name] [name], NO Other Clinician: Referring  Physician: Treating Physician/Extender: [name] in Treatment: 2 Education Assessment Education Provided To: Patient and Caregiver Education Topics Provided Wound/Skin Impairment: Methods: Explain/Verbal Responses: State content correctly Electronic  Signature(s) Signed: 08/20/2022 7:59:22 AM By: [name] [name] Entered By: [name] [name] on 08/14/2022 11:48:16 -------------------------------------------------------------------------------- Wound Assessment Details Patient Name: Date of Service: [name] [name], [name] [name] 08/14/2022 11:45 A M Medical Record Number: [id number] Patient Account Number: [id number] Date of Birth/Sex: Treating RN: 03/22/1931 (89 y.o. [name] [name] Primary Care Dimitria Ketchum: PA [name] [name], NO Other Clinician: Referring Rik Wadel: Treating Brya Simerly/Extender: [name] in Treatment: 2 Wound Status Wound Number: 1 Primary Medication Related Etiology: Wound Location: Right, Anterior T Great oe Wound Open Wounding Event: Gradually Appeared Status: Date Acquired: 06/04/2022 Comorbid Coronary Artery Disease, Hypertension, Peripheral Venous Weeks Of Treatment: 2 History: Disease, Type II Diabetes Clustered Wound: No Photos [name] [name], [name] [name] ([id number]) [id number] Page 6 of 8 Wound Measurements Length: (cm) 0.2 Width: (cm) 0.2 Depth: (cm) 0.1 Area: (cm) 0.031 Volume: (cm) 0.003 % Reduction in Area: 95.1% % Reduction in Volume: 95.2% Tunneling: No Undermining: No Wound Description Classification: Full Thickness Without Exposed Sup Wound Margin: Distinct, outline attached Exudate Amount: Medium Exudate Type: Serosanguineous Exudate Color: red, brown port Structures Wound Bed Granulation Amount: Medium (34-66%) Exposed Structure Granulation Quality: Red, Pink Fat Layer (Subcutaneous Tissue) Exposed: Yes Necrotic Amount: Medium (34-66%) Necrotic Quality: Eschar Periwound Skin Texture Texture Color No Abnormalities Noted: No No Abnormalities Noted: No Scarring: Yes Hemosiderin Staining: Yes Moisture No Abnormalities Noted: No Treatment Notes Wound #1 (Toe Great) Wound Laterality: Right, Anterior Cleanser Peri-Wound Care Topical Primary  Dressing Maxorb Extra Ag+ Alginate Dressing, 2x2 (in/in) Discharge Instruction: Apply to wound bed as instructed Secondary Dressing Optifoam Non-Adhesive Dressing, 4x4 in Discharge Instruction: Apply over primary dressing as directed. Woven Gauze Sponge, Non-Sterile 4x4 in Discharge Instruction: Apply over primary dressing as directed. Secured With Transpore Surgical Tape, 2x10 (in/yd) Discharge Instruction: Secure dressing with tape as directed. Compression Wrap Compression Stockings Add-Ons Electronic Signature(s) Signed: 08/20/2022 7:59:22 AM By: [name] [name] Entered By: [name] [name] on 08/14/2022 11:44:08 [name] [name] ([id number]) [id number] Page 7 of 8 -------------------------------------------------------------------------------- Wound Assessment Details Patient Name: Date of Service: [name] [name], [name] [name] 08/14/2022 11:45 A M Medical Record Number: [id number] Patient Account Number: [id number] Date of Birth/Sex: Treating RN: [birth year] (89 y.o. [name] [name] Primary Care Suheily Birks: PA [name] [name], NO Other Clinician: Referring Sophie Quiles: Treating Elandra Powell/Extender: [name] in Treatment: 2 Wound Status Wound Number: 2 Primary Diabetic Wound/Ulcer of the Lower Extremity Etiology: Wound Location: Right, Plantar T Great oe Wound Open Wounding Event: Gradually Appeared Status: Date Acquired: 06/04/2022 Comorbid Coronary Artery Disease, Hypertension, Peripheral Venous Weeks Of Treatment: 2 History: Disease, Type II Diabetes Clustered Wound: No Photos Wound Measurements Length: (cm) 0. Width: (cm) 0. Depth: (cm) 0. Area: (cm) 0 Volume: (cm) 0 3 % Reduction in Area: -51.1% 3 % Reduction in Volume: -40% 1 Tunneling: No .071 Undermining: No .007 Wound Description Classification: Grade 1 Wound Margin: Distinct, outline attached Exudate Amount: Medium Exudate Type: Serosanguineous Exudate Color: red,  brown Wound Bed Granulation Amount: Medium (34-66%) Exposed Structure Granulation Quality: Pink Fat Layer (Subcutaneous Tissue) Exposed: Yes Necrotic Amount: Medium (34-66%) Necrotic Quality: Eschar Periwound Skin Texture Texture Color No Abnormalities Noted: No No Abnormalities Noted: No Moisture No Abnormalities Noted: No Treatment Notes Wound #2 (Toe Great) Wound Laterality: Plantar, Right Cleanser Peri-Wound Care Topical [name] [name], [name] [name] ([id number]) [id number] Page 8 of 8 Primary Dressing Maxorb Extra Ag+ Alginate Dressing,  2x2 (in/in) Discharge Instruction: Apply to wound bed as instructed Secondary Dressing Optifoam Non-Adhesive Dressing, 4x4 in Discharge Instruction: Apply over primary dressing as directed. Woven Gauze Sponge, Non-Sterile 4x4 in Discharge Instruction: Apply over primary dressing as directed. Secured With Transpore Surgical Tape, 2x10 (in/yd) Discharge Instruction: Secure dressing with tape as directed. Compression Wrap Compression Stockings Add-Ons Electronic Signature(s) Signed: 08/20/2022 7:59:22 AM By: [name] [name] Entered By: [name] [name] on 08/14/2022 11:44:41 -------------------------------------------------------------------------------- Vitals Details Patient Name: Date of Service: [name] [name], [name] [name] 08/14/2022 11:45 A M Medical Record Number: [id number] Patient Account Number: [id number] Date of Birth/Sex: Treating RN: September 21, 1931 (89 y.o. [name] [name] Primary Care Tuan Tippin: PA [name] [name], NO Other Clinician: Referring Breylen Agyeman: Treating Adamae Ricklefs/Extender: [name] in Treatment: 2 Vital Signs Time Taken: 11:30 Temperature (F): 97.6 Height (in): 73 Pulse (bpm): 76 Weight (lbs): 233 Respiratory Rate (breaths/min): 18 Body Mass Index (BMI): 30.7 Blood Pressure (mmHg): 150/82 Capillary Blood Glucose (mg/dl): 440 Reference Range: 80 - 120 mg / dl Electronic  Signature(s) Signed: 08/20/2022 7:59:22 AM By: [name] [name] Entered By: [name] [name] on 08/14/2022 11:32:22

## 2022-08-28 ENCOUNTER — Encounter (HOSPITAL_BASED_OUTPATIENT_CLINIC_OR_DEPARTMENT_OTHER): Payer: Medicare Other | Admitting: General Surgery

## 2022-08-28 DIAGNOSIS — E11621 Type 2 diabetes mellitus with foot ulcer: Secondary | ICD-10-CM | POA: Diagnosis not present

## 2022-08-28 NOTE — Progress Notes (Addendum)
[name] [name] ([id number]) [id number] Page 1 of 6 Visit Report for 08/28/2022 Chief Complaint Document Details Patient Name: Date of Service: [name] [name], [name] [name] 08/28/2022 11:15 A M Medical Record Number: [id number] Patient Account Number: [id number] Date of Birth/Sex: Treating RN: Jun 05, 1931 (89 y.o. M) Primary Care Provider: [name], NO Other Clinician: Referring Provider: Treating Provider/Extender: [name] in Treatment: 4 Information Obtained from: Patient Chief Complaint Patients presents for treatment of an open diabetic ulcer of the right great toe Electronic Signature(s) Signed: 08/28/2022 12:08:28 PM By: Duanne Guess MD FACS Entered By: Duanne Guess on 08/28/2022 12:08:28 -------------------------------------------------------------------------------- HPI Details Patient Name: Date of Service: [name] [name], [name] [name] 08/28/2022 11:15 A M Medical Record Number: [id number] Patient Account Number: [id number] Date of Birth/Sex: Treating RN: [birth year] (89 y.o. M) Primary Care Provider: [name], NO Other Clinician: Referring Provider: Treating Provider/Extender: [name] in Treatment: 4 History of Present Illness HPI Description: ADMISSION 07/31/2022 This is an 89 year old poorly controlled type II diabetic (last hemoglobin A1c 8.9%) with peripheral vascular disease, congestive heart failure, and cognitive decline. He presents with a wound on the nailbed of his right great toe, as well as an ulcer on the plantar surface of the same toe. The nailbed wound occurred when he was applying an antifungal medication. He says that at 1 point he just ripped the toenail off. He says this happened about 2 months ago. He has been applying Mercurochrome and Clorox to the site. He says he only noticed the ulcer on the plantar surface of the same toe a couple of days ago. He thinks he wiggles his toes  inside his shoes and that caused a friction-related wound. ABI in clinic today was 0.83. 08/14/2022: For some reason, the home health that we had ordered did not follow through and he has not been getting any wound care at home. He has apparently been treating his wounds with his own concoctions. Fortunately, both wounds seem to have improved. They are smaller. There is callus accumulation around the wound on his dorsal great toe and the plantar great toe wound is very superficial with just a little bit of dry eschar and slough present. 08/28/2022: His wounds are healed. Electronic Signature(s) Signed: 08/28/2022 12:08:47 PM By: Duanne Guess MD FACS Entered By: Duanne Guess on 08/28/2022 12:08:47 [name] [name] ([id number]) [id number] Page 2 of 6 -------------------------------------------------------------------------------- Physical Exam Details Patient Name: Date of Service: [name] [name], [name] [name] 08/28/2022 11:15 A M Medical Record Number: [id number] Patient Account Number: [id number] Date of Birth/Sex: Treating RN: [birth year] (89 y.o. M) Primary Care Provider: PA Fidela Juneau Other Clinician: Referring Provider: Treating Provider/Extender: [name] in Treatment: 4 Constitutional Hypertensive, asymptomatic. . . . no acute distress. Respiratory Normal work of breathing on room air. Notes His wounds are healed. Electronic Signature(s) Signed: 08/28/2022 12:09:41 PM By: Duanne Guess MD FACS Entered By: Duanne Guess on 08/28/2022 12:09:41 -------------------------------------------------------------------------------- Physician Orders Details Patient Name: Date of Service: [name] [name], [name] [name] 08/28/2022 11:15 A M Medical Record Number: [id number] Patient Account Number: [id number] Date of Birth/Sex: Treating RN: Mar 01, 1931 (89 y.o. [name] Primary Care Provider: PA TIENT, [name] Other Clinician: Referring  Provider: Treating Provider/Extender: [name] in Treatment: 4 Verbal / Phone Orders: No Diagnosis Coding ICD-10 Coding Code Description [phone] Non-pressure chronic ulcer of other part of right foot with fat layer exposed E11.621 Type 2 diabetes mellitus with foot ulcer I73.9 Peripheral vascular disease, unspecified E11.65 Type 2 diabetes  mellitus with hyperglycemia I50.20 Unspecified systolic (congestive) heart failure E66.01 Morbid (severe) obesity due to excess calories Discharge From [facility] Discharge from Wound Care Center Bathing/ Shower/ Hygiene May shower and wash wound with soap and water. Edema Control - Lymphedema / SCD / Other Moisturize legs daily. - to legs and feet daily Electronic Signature(s) Signed: 08/28/2022 12:09:53 PM By: Duanne Guess MD FACS Entered By: Duanne Guess on 08/28/2022 12:09:53 [name] [name] ([id number]) [id number] Page 3 of 6 -------------------------------------------------------------------------------- Problem List Details Patient Name: Date of Service: [name] [name], [name] [name] 08/28/2022 11:15 A M Medical Record Number: [id number] Patient Account Number: [id number] Date of Birth/Sex: Treating RN: 05-Oct-1931 (89 y.o. M) Primary Care Provider: [name], NO Other Clinician: Referring Provider: Treating Provider/Extender: [name] in Treatment: 4 Active Problems ICD-10 Encounter Code Description Active Date MDM Diagnosis L97.512 Non-pressure chronic ulcer of other part of right foot with fat layer exposed 07/31/2022 No Yes E11.621 Type 2 diabetes mellitus with foot ulcer 07/31/2022 No Yes I73.9 Peripheral vascular disease, unspecified 07/31/2022 No Yes E11.65 Type 2 diabetes mellitus with hyperglycemia 07/31/2022 No Yes I50.20 Unspecified systolic (congestive) heart failure 07/31/2022 No Yes E66.01 Morbid (severe) obesity due to excess calories  07/31/2022 No Yes Inactive Problems Resolved Problems Electronic Signature(s) Signed: 08/28/2022 11:59:30 AM By: Duanne Guess MD FACS Entered By: Duanne Guess on 08/28/2022 11:59:30 -------------------------------------------------------------------------------- Progress Note Details Patient Name: Date of Service: [name] [name], [name] [name] 08/28/2022 11:15 A M Medical Record Number: [id number] Patient Account Number: [id number] Date of Birth/Sex: Treating RN: 22-Aug-1931 (89 y.o. M) Primary Care Provider: [name], NO Other Clinician: Referring Provider: Treating Provider/Extender: [name] in Treatment: 4 Subjective Chief Complaint Information obtained from Patient [name] [name], [name] [name] ([id number]) [id number] Page 4 of 6 Patients presents for treatment of an open diabetic ulcer of the right great toe History of Present Illness (HPI) ADMISSION 07/31/2022 This is an 89 year old poorly controlled type II diabetic (last hemoglobin A1c 8.9%) with peripheral vascular disease, congestive heart failure, and cognitive decline. He presents with a wound on the nailbed of his right great toe, as well as an ulcer on the plantar surface of the same toe. The nailbed wound occurred when he was applying an antifungal medication. He says that at 1 point he just ripped the toenail off. He says this happened about 2 months ago. He has been applying Mercurochrome and Clorox to the site. He says he only noticed the ulcer on the plantar surface of the same toe a couple of days ago. He thinks he wiggles his toes inside his shoes and that caused a friction-related wound. ABI in clinic today was 0.83. 08/14/2022: For some reason, the home health that we had ordered did not follow through and he has not been getting any wound care at home. He has apparently been treating his wounds with his own concoctions. Fortunately, both wounds seem to have improved. They are  smaller. There is callus accumulation around the wound on his dorsal great toe and the plantar great toe wound is very superficial with just a little bit of dry eschar and slough present. 08/28/2022: His wounds are healed. Patient History Family History Hypertension - Father, Kidney Disease - Mother. Medical History Cardiovascular Patient has history of Coronary Artery Disease, Hypertension, Peripheral Venous Disease Endocrine Patient has history of Type II Diabetes Hospitalization/Surgery History - Coronary artery bypass. - Prostate surgery. Medical A Surgical History Notes nd Gastrointestinal GERD Oncologic Prostate cancer Objective Constitutional Hypertensive, asymptomatic. no acute distress.  Vitals Time Taken: 11:54 AM, Height: 73 in, Weight: 233 lbs, BMI: 30.7, Temperature: 97.4 F, Pulse: 75 bpm, Respiratory Rate: 18 breaths/min, Blood Pressure: 161/92 mmHg, Capillary Blood Glucose: 132 mg/dl. General Notes: glucose per pt report Respiratory Normal work of breathing on room air. General Notes: His wounds are healed. Integumentary (Hair, Skin) Wound #1 status is Open. Original cause of wound was Gradually Appeared. The date acquired was: 06/04/2022. The wound has been in treatment 4 weeks. The wound is located on the [employer]. The wound measures 0cm length x 0cm width x 0cm depth; 0cm^2 area and 0cm^3 volume. There is no oe tunneling or undermining noted. There is a none present amount of drainage noted. There is no granulation within the wound bed. There is no necrotic tissue within the wound bed. The periwound skin appearance had no abnormalities noted for moisture. The periwound skin appearance had no abnormalities noted for color. The periwound skin appearance exhibited: Callus. The periwound skin appearance did not exhibit: Scarring. Wound #2 status is Open. Original cause of wound was Gradually Appeared. The date acquired was: 06/04/2022. The wound has been in  treatment 4 weeks. The wound is located on the [employer]. The wound measures 0cm length x 0cm width x 0cm depth; 0cm^2 area and 0cm^3 volume. There is no oe tunneling or undermining noted. There is a none present amount of drainage noted. There is no granulation within the wound bed. There is no necrotic tissue within the wound bed. The periwound skin appearance had no abnormalities noted for moisture. The periwound skin appearance had no abnormalities noted for color. The periwound skin appearance exhibited: Callus. Periwound temperature was noted as No Abnormality. Assessment Active Problems ICD-10 Non-pressure chronic ulcer of other part of right foot with fat layer exposed Type 2 diabetes mellitus with foot ulcer Peripheral vascular disease, unspecified Type 2 diabetes mellitus with hyperglycemia Unspecified systolic (congestive) heart failure Morbid (severe) obesity due to excess calories [name] [name], [name] [name] ([id number]) [id number] Page 5 of 6 Plan Discharge From [facility]: Discharge from Wound Care Center Bathing/ Shower/ Hygiene: May shower and wash wound with soap and water. Edema Control - Lymphedema / SCD / Other: Moisturize legs daily. - to legs and feet daily 08/28/2022: His wounds are healed. He was reminded of the importance of proper diabetic footcare, including daily inspection, making sure the webs between his toes are dry, and adequate moisturizing to prevent skin cracking. We will discharge him from the wound care center. He may follow-up as needed. Electronic Signature(s) Signed: 08/28/2022 12:10:38 PM By: Duanne Guess MD FACS Entered By: Duanne Guess on 08/28/2022 12:10:38 -------------------------------------------------------------------------------- HxROS Details Patient Name: Date of Service: [name] [name], [name] [name] 08/28/2022 11:15 A M Medical Record Number: [id number] Patient Account Number: [id number] Date of  Birth/Sex: Treating RN: Jun 14, 1931 (89 y.o. M) Primary Care Provider: [name], NO Other Clinician: Referring Provider: Treating Provider/Extender: [name] in Treatment: 4 Cardiovascular Medical History: Positive for: Coronary Artery Disease; Hypertension; Peripheral Venous Disease Gastrointestinal Medical History: Past Medical History Notes: GERD Endocrine Medical History: Positive for: Type II Diabetes Oncologic Medical History: Past Medical History Notes: Prostate cancer Immunizations Pneumococcal Vaccine: Received Pneumococcal Vaccination: No Implantable Devices No devices added Hospitalization / Surgery History Type of Hospitalization/Surgery Coronary artery bypass Prostate surgery [name] [name], [name] [name] ([id number]) [phone].pdf Page 6 of 6 Family and Social History Hypertension: Yes - Father; Kidney Disease: Yes - Mother Electronic Signature(s) Signed: 08/28/2022 12:11:15 PM By: Duanne Guess MD FACS Entered By: Lady Gary,  Alphonza Tramell on 08/28/2022 12:09:06 -------------------------------------------------------------------------------- SuperBill Details Patient Name: Date of Service: [name] [name], [name] [name] 08/28/2022 Medical Record Number: [id number] Patient Account Number: [id number] Date of Birth/Sex: Treating RN: January 05, 1932 (89 y.o. M) Primary Care Provider: [name], NO Other Clinician: Referring Provider: Treating Provider/Extender: [name] in Treatment: 4 Diagnosis Coding ICD-10 Codes Code Description [phone] Non-pressure chronic ulcer of other part of right foot with fat layer exposed E11.621 Type 2 diabetes mellitus with foot ulcer I73.9 Peripheral vascular disease, unspecified E11.65 Type 2 diabetes mellitus with hyperglycemia I50.20 Unspecified systolic (congestive) heart failure E66.01 Morbid (severe) obesity due to excess calories Facility Procedures : CPT4 Code:  [id number] Description: 99213 - WOUND CARE VISIT-LEV 3 EST PT Modifier: Quantity: 1 Physician Procedures : CPT4 Code Description Modifier [id number] 99213 - WC PHYS LEVEL 3 - EST PT ICD-10 Diagnosis Description L97.512 Non-pressure chronic ulcer of other part of right foot with fat layer exposed E11.621 Type 2 diabetes mellitus with foot ulcer I73.9 Peripheral  vascular disease, unspecified E11.65 Type 2 diabetes mellitus with hyperglycemia Quantity: 1 Electronic Signature(s) Signed: 08/28/2022 12:56:51 PM By: Duanne Guess MD FACS Signed: 08/28/2022 5:13:39 PM By: Zenaida Deed RN, BSN Previous Signature: 08/28/2022 12:10:58 PM Version By: Duanne Guess MD FACS Entered By: Zenaida Deed on 08/28/2022 12:12:22

## 2022-08-29 NOTE — Progress Notes (Signed)
[name] [name], [name] [name] ([id number]) [id number] Page 1 of 9 Visit Report for 08/28/2022 Arrival Information Details Patient Name: Date of Service: [name] [name], [name] [name] 08/28/2022 11:15 A M Medical Record Number: [id number] Patient Account Number: [id number] Date of Birth/Sex: Treating RN: [birth year][name] [name] (89 y.o. [name], [name] Primary Care [name]: [name], [name] Other Clinician: Referring [name]: Treating [name]/Extender: [name] in Treatment: 4 Visit Information History Since Last Visit Added or deleted any medications: No Patient Arrived: Ambulatory Any new allergies or adverse reactions: No Arrival Time: 11:55 Had a fall or experienced change in No Accompanied By: daughter activities of daily living that [name] affect Transfer Assistance: None risk of falls: Patient Identification Verified: Yes Signs or symptoms of abuse/neglect since last visito No Secondary Verification Process Completed: Yes Hospitalized since last visit: No Patient Requires Transmission-Based Precautions: No Implantable device outside of the clinic excluding No Patient Has Alerts: No cellular tissue based products placed in the center since last visit: Has Dressing in Place as Prescribed: Yes Pain Present Now: No Electronic Signature(s) Signed: 08/28/2022 5:13:39 PM By: Zenaida Deed RN, BSN Entered By: Zenaida Deed on 08/28/2022 11:56:13 -------------------------------------------------------------------------------- Clinic Level of Care Assessment Details Patient Name: Date of Service: [name] [name], [name] [name] 08/28/2022 11:15 A M Medical Record Number: [id number] Patient Account Number: [id number] Date of Birth/Sex: Treating RN: [birth year][name] [name] (89 y.o. [name] Primary Care Charlayne Vultaggio: [name], [name] Other Clinician: Referring Puanani Gene: Treating [name]/Extender: [name] in Treatment: 4 Clinic Level of Care Assessment  Items TOOL 4 Quantity Score []  - 0 Use when only an EandM is performed on FOLLOW-UP visit ASSESSMENTS - Nursing Assessment / Reassessment X- 1 10 Reassessment of Co-morbidities (includes updates in patient status) X- 1 5 Reassessment of Adherence to Treatment Plan ASSESSMENTS - Wound and Skin A ssessment / Reassessment []  - 0 Simple Wound Assessment / Reassessment - one wound X- 2 5 Complex Wound Assessment / Reassessment - multiple wounds []  - 0 Dermatologic / Skin Assessment (not related to wound area) ASSESSMENTS - Focused Assessment []  - 0 Circumferential Edema Measurements - multi extremities []  - 0 Nutritional Assessment / Counseling / Intervention [name] [name], [name] [name] ([id number]) [id number] Page 2 of 9 X- 1 5 Lower Extremity Assessment (monofilament, tuning fork, pulses) []  - 0 Peripheral Arterial Disease Assessment (using hand held doppler) ASSESSMENTS - Ostomy and/or Continence Assessment and Care []  - 0 Incontinence Assessment and Management []  - 0 Ostomy Care Assessment and Management (repouching, etc.) PROCESS - Coordination of Care X - Simple Patient / Family Education for ongoing care 1 15 []  - 0 Complex (extensive) Patient / Family Education for ongoing care X- 1 10 Staff obtains Chiropractor, Records, T Results / Process Orders est []  - 0 Staff telephones HHA, Nursing Homes / Clarify orders / etc []  - 0 Routine Transfer to another Facility (non-emergent condition) []  - 0 Routine Hospital Admission (non-emergent condition) []  - 0 New Admissions / Manufacturing engineer / Ordering NPWT Apligraf, etc. , []  - 0 Emergency Hospital Admission (emergent condition) X- 1 10 Simple Discharge Coordination []  - 0 Complex (extensive) Discharge Coordination PROCESS - Special Needs []  - 0 Pediatric / Minor Patient Management []  - 0 Isolation Patient Management []  - 0 Hearing / Language / Visual special needs []  - 0 Assessment of  Community assistance (transportation, D/C planning, etc.) []  - 0 Additional assistance / Altered mentation []  - 0 Support Surface(s) Assessment (bed, cushion, seat, etc.) INTERVENTIONS - Wound Cleansing / Measurement []  - 0 Simple  Wound Cleansing - one wound X- 2 5 Complex Wound Cleansing - multiple wounds X- 1 5 Wound Imaging (photographs - any number of wounds) []  - 0 Wound Tracing (instead of photographs) []  - 0 Simple Wound Measurement - one wound []  - 0 Complex Wound Measurement - multiple wounds INTERVENTIONS - Wound Dressings []  - 0 Small Wound Dressing one or multiple wounds []  - 0 Medium Wound Dressing one or multiple wounds []  - 0 Large Wound Dressing one or multiple wounds []  - 0 Application of Medications - topical []  - 0 Application of Medications - injection INTERVENTIONS - Miscellaneous []  - 0 External ear exam []  - 0 Specimen Collection (cultures, biopsies, blood, body fluids, etc.) []  - 0 Specimen(s) / Culture(s) sent or taken to Lab for analysis []  - 0 Patient Transfer (multiple staff / Nurse, adult / Similar devices) []  - 0 Simple Staple / Suture removal (25 or less) []  - 0 Complex Staple / Suture removal (26 or more) []  - 0 Hypo / Hyperglycemic Management (close monitor of Blood Glucose) [name] [name] ([id number]) [id number] Page 3 of 9 []  - 0 Ankle / Brachial Index (ABI) - do not check if billed separately X- 1 5 Vital Signs Has the patient been seen at the hospital within the last three years: Yes Total Score: 85 Level Of Care: New/Established - Level 3 Electronic Signature(s) Signed: 08/28/2022 5:13:39 PM By: Zenaida Deed RN, BSN Entered By: Zenaida Deed on 08/28/2022 12:12:12 -------------------------------------------------------------------------------- Encounter Discharge Information Details Patient Name: Date of Service: [name] [name], [name] [name] 08/28/2022 11:15 A M Medical Record Number:  [id number] Patient Account Number: [id number] Date of Birth/Sex: Treating RN: [name] [name], [name] [name] (89 y.o. [name] Primary Care Aden Sek: [name], [name] Other Clinician: Referring Holy Battenfield: Treating Ranita Stjulien/Extender: [name] in Treatment: 4 Encounter Discharge Information Items Discharge Condition: Stable Ambulatory Status: Ambulatory Discharge Destination: Home Transportation: Private Auto Accompanied By: daughter Schedule Follow-up Appointment: Yes Clinical Summary of Care: Patient Declined Electronic Signature(s) Signed: 08/28/2022 5:13:39 PM By: Zenaida Deed RN, BSN Entered By: Zenaida Deed on 08/28/2022 12:12:49 -------------------------------------------------------------------------------- Lower Extremity Assessment Details Patient Name: Date of Service: [name] [name], [name] [name] 08/28/2022 11:15 A M Medical Record Number: [id number] Patient Account Number: [id number] Date of Birth/Sex: Treating RN: [birth year] (89 y.o. [name] Primary Care Ethel Meisenheimer: [name], [name] Other Clinician: Referring Kyleena Scheirer: Treating Kameron Glazebrook/Extender: [name] in Treatment: 4 Edema Assessment Assessed: [Left: No] [Right: No] Edema: [Left: [name]] [Right: o] Calf Left: Right: Point of Measurement: From Medial Instep 39 cm Ankle Left: Right: Point of Measurement: From Medial Instep 23.5 cm Vascular Assessment JAYDIS, WILLIAM ([id number]) [Right:[id number] Page 4 of 9] Pulses: Dorsalis Pedis Palpable: [Right:No] Extremity colors, hair growth, and conditions: Extremity Color: [Right:Normal] Hair Growth on Extremity: [Right:Yes] Temperature of Extremity: [Right:Warm] Dependent Rubor: [Right:No No] Electronic Signature(s) Signed: 08/28/2022 5:13:39 PM By: Zenaida Deed RN, BSN Entered By: Zenaida Deed on 08/28/2022  11:57:23 -------------------------------------------------------------------------------- Multi Wound Chart Details Patient Name: Date of Service: [name] [name], [name] [name] 08/28/2022 11:15 A M Medical Record Number: [id number] Patient Account Number: [id number] Date of Birth/Sex: Treating RN: Oct [birth year], [name] [name] (89 y.o. 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/A Primary Etiology: Extremity Coronary Artery Disease, Coronary Artery Disease, [name]/A Comorbid History: Hypertension, Peripheral Venous Hypertension, Peripheral Venous Disease, Type II Diabetes Disease, Type II Diabetes 06/04/2022 06/04/2022 [name]/A Date Acquired: 4 4 [name]/A Weeks of Treatment: Open Open [name]/A Wound Status: No No [name]/A Wound Recurrence: 0x0x0 0x0x0 [name]/A Measurements L x W x D (cm) 0 0 [name]/A A (cm) : rea 0 0 [name]/A Volume (cm) : 100.00% 100.00% [name]/A % Reduction in Area: 100.00% 100.00% [name]/A % Reduction in Volume: Full Thickness Without Exposed Grade 1 [name]/A Classification: Support Structures None Present None Present [name]/A Exudate Amount: None Present (0%) None Present (0%) [name]/A Granulation Amount: None Present (0%) None Present (0%) [name]/A Necrotic Amount: Fascia: No Fascia: No [name]/A Exposed Structures: Fat Layer (Subcutaneous Tissue): No Fat Layer (Subcutaneous Tissue): No Tendon: No Tendon: No Muscle: No Muscle: No Joint: No Joint: No Bone: No Bone: No Large (67-100%) Large (67-100%)  [name]/A Epithelialization: Callus: Yes Callus: Yes [name]/A Periwound Skin TextureJEISON, GLAZEWSKI ([id number]) [id number] Page 5 of 9 Scarring: No No Abnormalities Noted No Abnormalities Noted [name]/A Periwound Skin Moisture: Hemosiderin Staining: Yes No Abnormalities Noted [name]/A Periwound Skin Color: [name]/A No Abnormality [name]/A Temperature: Treatment Notes Electronic Signature(s) Signed: 08/28/2022 12:04:06 PM By: Duanne Guess MD FACS Entered By: Duanne Guess on 08/28/2022 12:04:06 -------------------------------------------------------------------------------- Multi-Disciplinary Care Plan Details Patient Name: Date of Service: [name] [name], [name] [name] 08/28/2022 11:15 A M Medical Record Number: [id number] Patient Account Number: [id number] Date of Birth/Sex: Treating RN: [name] [name][birth year] (89 y.o. [name] Primary Care Jazzmon Prindle: [name], [name] Other Clinician: Referring Treyveon Mochizuki: Treating Pasha Broad/Extender: [name] in Treatment: 4 Multidisciplinary Care Plan reviewed with physician Active Inactive Electronic Signature(s) Signed: 08/28/2022 5:13:39 PM By: Zenaida Deed RN, BSN Entered By: Zenaida Deed on 08/28/2022 12:02:33 -------------------------------------------------------------------------------- Pain Assessment Details Patient Name: Date of Service: [name] [name], [name] [name] 08/28/2022 11:15 A M Medical Record Number: [id number] Patient Account Number: [id number] Date of Birth/Sex: Treating RN: 02/03/1932 (89 y.o. [name] Primary Care Iman Reinertsen: [name], [name] Other Clinician: Referring Kailana Benninger: Treating Chi Woodham/Extender: [name] in Treatment: 4 Active Problems Location of Pain Severity and Description of Pain Patient Has Paino No Site Locations Rate the pain. VIRGIN, MAKOWSKI ([id number]) [id number] Page 6 of 9 Rate the pain. Current Pain Level: 0 Pain  Management and Medication Current Pain Management: Electronic Signature(s) Signed: 08/28/2022 5:13:39 PM By: Zenaida Deed RN, BSN Entered By: Zenaida Deed on 08/28/2022 11:56:24 -------------------------------------------------------------------------------- Patient/Caregiver Education Details Patient Name: Date of Service: [name] [name], [name] [name] 7/25/2024andnbsp11:15 A M Medical Record Number: [id number] Patient Account Number: [id number] Date of Birth/Gender: Treating RN: Jun [name], [name] [name] (89 y.o. [name] Primary Care Physician: [name], [name] Other Clinician: Referring Physician: Treating Physician/Extender: [name] in Treatment: 4 Education Assessment Education Provided To: Patient Education Topics Provided Wound/Skin Impairment: Methods: Explain/Verbal Responses: Reinforcements needed, State content correctly Electronic Signature(s) Signed: 08/28/2022 5:13:39 PM By: Zenaida Deed RN, BSN Entered By: Zenaida Deed on 08/28/2022 12:02:49 -------------------------------------------------------------------------------- Wound Assessment Details Patient Name: Date of Service: [name] [name], [name] [name] 08/28/2022 11:15 A M Medical Record Number: [id number] Patient Account Number: [id number] Date of Birth/Sex: Treating RN: [birth year]-[name] [name] (89 y.o. [name] Primary Care Chelcee Korpi: [name], [name] Other Clinician: Referring Karim Aiello: Treating Bakari Nikolai/Extender: Kyra Searles Dong[place], [name] [name] ([id number]) [id number] Page 7 of 9 Weeks in Treatment: 4 Wound Status Wound Number: 1 Primary Medication Related Etiology: Wound Location:  Right, Anterior T Great oe Wound Open Wounding Event: Gradually Appeared Status: Date Acquired: 06/04/2022 Comorbid Coronary Artery Disease, Hypertension, Peripheral Venous Weeks Of Treatment: 4 History: Disease, Type II Diabetes Clustered Wound: No Photos Wound  Measurements Length: (cm) Width: (cm) Depth: (cm) Area: (cm) Volume: (cm) 0 % Reduction in Area: 100% 0 % Reduction in Volume: 100% 0 Epithelialization: Large (67-100%) 0 Tunneling: No 0 Undermining: No Wound Description Classification: Full Thickness Without Exposed Support Structures Exudate Amount: None Present Foul Odor After Cleansing: No Slough/Fibrino No Wound Bed Granulation Amount: None Present (0%) Exposed Structure Necrotic Amount: None Present (0%) Fascia Exposed: No Fat Layer (Subcutaneous Tissue) Exposed: No Tendon Exposed: No Muscle Exposed: No Joint Exposed: No Bone Exposed: No Periwound Skin Texture Texture Color No Abnormalities Noted: No No Abnormalities Noted: Yes Callus: Yes Scarring: No Moisture No Abnormalities Noted: Yes Electronic Signature(s) Signed: 08/28/2022 5:13:39 PM By: Zenaida Deed RN, BSN Entered By: Zenaida Deed on 08/28/2022 12:00:38 -------------------------------------------------------------------------------- Wound Assessment Details Patient Name: Date of Service: [name] [name], [name] [name] 08/28/2022 11:15 A M Medical Record Number: [id number] Patient Account Number: [id number] Date of Birth/Sex: Treating RN: [birth year]/[name] [name] (89 y.o. [name] Primary Care Keary Hanak: [name], [name] Other Clinician: Referring Eliya Bubar: Treating Janeli Lewison/Extender: [name] in Treatment: [address], [place] ([id number]) [id number] Page 8 of 9 Wound Status Wound Number: 2 Primary Diabetic Wound/Ulcer of the Lower Extremity Etiology: Wound Location: Right, Plantar T Great oe Wound Open Wounding Event: Gradually Appeared Status: Date Acquired: 06/04/2022 Comorbid Coronary Artery Disease, Hypertension, Peripheral Venous Weeks Of Treatment: 4 History: Disease, Type II Diabetes Clustered Wound: No Photos Wound Measurements Length: (cm) Width: (cm) Depth: (cm) Area: (cm) Volume: (cm) 0  % Reduction in Area: 100% 0 % Reduction in Volume: 100% 0 Epithelialization: Large (67-100%) 0 Tunneling: No 0 Undermining: No Wound Description Classification: Grade 1 Exudate Amount: None Present Foul Odor After Cleansing: No Slough/Fibrino No Wound Bed Granulation Amount: None Present (0%) Exposed Structure Necrotic Amount: None Present (0%) Fascia Exposed: No Fat Layer (Subcutaneous Tissue) Exposed: No Tendon Exposed: No Muscle Exposed: No Joint Exposed: No Bone Exposed: No Periwound Skin Texture Texture Color No Abnormalities Noted: No No Abnormalities Noted: Yes Callus: Yes Temperature / Pain Temperature: No Abnormality Moisture No Abnormalities Noted: Yes Electronic Signature(s) Signed: 08/28/2022 5:13:39 PM By: Zenaida Deed RN, BSN Entered By: Zenaida Deed on 08/28/2022 12:01:23 -------------------------------------------------------------------------------- Vitals Details Patient Name: Date of Service: [name] [name], BREAKIRON 08/28/2022 11:15 A M Medical Record Number: [id number] Patient Account Number: [id number] Date of Birth/Sex: Treating RN: [birth year], [name] [name] (89 y.o. [name] Primary Care Ajee Heasley: [name], [name] Other Clinician: Referring Jazmaine Fuelling: Treating Atul Delucia/Extender: [name] in Treatment: 4 Vital Signs Daniels, [place] ([id number]) [id number] Page 9 of 9 Time Taken: 11:54 Temperature (F): 97.4 Height (in): 73 Pulse (bpm): 75 Weight (lbs): 233 Respiratory Rate (breaths/min): [name] Body Mass Index (BMI): 30.7 Blood Pressure (mmHg): 161/92 Capillary Blood Glucose (mg/dl): 244 Reference Range: 80 - 120 mg / dl Notes glucose per pt report Electronic Signature(s) Signed: 08/28/2022 5:13:39 PM By: Zenaida Deed RN, BSN Entered By: Zenaida Deed on 08/28/2022 11:55:04

## 2022-09-01 ENCOUNTER — Other Ambulatory Visit (INDEPENDENT_AMBULATORY_CARE_PROVIDER_SITE_OTHER): Payer: Medicare Other

## 2022-09-01 DIAGNOSIS — Z794 Long term (current) use of insulin: Secondary | ICD-10-CM

## 2022-09-01 DIAGNOSIS — E1165 Type 2 diabetes mellitus with hyperglycemia: Secondary | ICD-10-CM | POA: Diagnosis not present

## 2022-09-01 LAB — BASIC METABOLIC PANEL
BUN: 21 mg/dL (ref 6–23)
CO2: 25 mEq/L (ref 19–32)
Calcium: 9.1 mg/dL (ref 8.4–10.5)
Chloride: 106 mEq/L (ref 96–112)
Creatinine, Ser: 1.2 mg/dL (ref 0.40–1.50)
GFR: 53.84 mL/min — ABNORMAL LOW (ref >60.00)
Glucose, Bld: 180 mg/dL — ABNORMAL HIGH (ref 70–99)
Potassium: 3.7 mEq/L (ref 3.5–5.1)
Sodium: 140 mEq/L (ref 135–145)

## 2022-09-01 LAB — HEMOGLOBIN A1C: Hgb A1c MFr Bld: 8.2 % — ABNORMAL HIGH (ref 4.6–6.5)

## 2022-09-04 ENCOUNTER — Encounter: Payer: Medicare Other | Admitting: Endocrinology

## 2022-09-04 ENCOUNTER — Encounter: Payer: Self-pay | Admitting: Endocrinology

## 2022-09-04 NOTE — Progress Notes (Signed)
This encounter was created in error - please disregard.

## 2022-09-08 ENCOUNTER — Other Ambulatory Visit: Payer: Self-pay | Admitting: Internal Medicine

## 2022-09-08 NOTE — Progress Notes (Signed)
Office Visit    Patient Name: [name] [name] Date of Encounter: 09/09/2022  PCP:  Patient, No Pcp Per   [facility]  Cardiologist:  Kristeen Miss, MD  Advanced Practice Provider:  No care team member to display Electrophysiologist:  None   HPI    [name] [name] is a 89 y.o. male with CAD s/p CABG 01/2013, type II diabetes mellitus, BPH, chronic systolic heart failure, hypertension, PAD, and hyperlipidemia presents today for  follow-up appointment.   He was seen 03/22/2021 and was having some mid  sternal chest pain. It was occurring after he ate food. It would resolve in about 30 minutes. He did not need a nitro. He took some Prilosec and it eventually resolved. He is chronically SOB, he had some indigestion like pains at times but not exertional. Labs were looking good.   I saw him 09/23/2021,, he shares that he was in the ED over the weekend with a UTI.  It was noted that he had encephalopathy.  He was on antibiotics.  Today he has not had any chest pains or shortness of breath.  He has not had any palpitations or fast heart rates.  His blood pressure is well controlled at 138/60.  He is still having some claudication with ambulation and we have set up an appointment with Dr. Gery Pray who he has seen in the past.  He occasionally has a little bit of swelling in his left leg but today he appears euvolemic.  His weight has been pretty stable and in fact he has lost a few pounds.   Dr. Elease Hashimoto saw him 05/15/2022 and at that time he was having some bad headaches.  On amlodipine which may have been contributing to the headache.  Suggesting Tylenol.  Overall seemed okay.  Today, he requested an appointment with me regarding left-sided chest pain.  He was laying on the bed writing and he laid there a good while, had some pains.  He does have a history of chronic left upper shoulder pain.  He states his shoulder was hurting when he kept writing.  Then had some following  upper left-sided chest discomfort (he pointed more towards the inside of his shoulder joint).  Felt like he was having some acid reflux maybe a pulled muscle as well.  The upper left pain finally went away.  When he moved his arm a certain way the pain to go away which makes me think this was more musculoskeletal.  We discussed the difference between musculoskeletal pain, cardiac chest pain, and GERD related pain.  We refilled his nitroglycerin tabs and asked him to only use these if he felt the pain was cardiac related.  He is elderly and lives by himself so we have sent a referral to Renaee Munda for assistance.  Reports no shortness of breath nor dyspnea on exertion. Reports no chest pain, pressure, or tightness. No edema, orthopnea, PND. Reports no palpitations.    Past Medical History    Past Medical History:  Diagnosis Date   Benign prostatic hyperplasia (BPH) with urinary urge incontinence 11/13/2017   Chronic left shoulder pain 11/13/2017   Chronic non-seasonal allergic rhinitis 02/17/2006   Chronic systolic heart failure (HCC) 10/31/2016   Echo (05/03/2015): LVEF 45-50%, grade 2 diastolic dysfunction   Coronary artery disease involving native coronary artery of native heart without angina pectoris 09/30/2007   s/p CABG on 01/05/2013: left internal mammary artery to left anterior descending, saphenous vein graft to obtuse marginal 1,  sequential saphenous vein graft to acute marginal and posterior descending   Essential hypertension 11/04/2005   Gastroesophageal reflux disease with hiatal hernia 11/04/2005   History of prostate cancer 02/17/2006   Diagnosed 1995, s/p seed implantation in the late 1990's, PSA undetectable 04/10/2016   Hyperlipidemia 11/04/2005   Obesity (BMI 30.0-34.9) 11/13/2017   Peripheral arterial disease (HCC) 09/30/2007   with claudication, failed attempt at LLE PTCA   Type 2 diabetes mellitus with microalbuminuria, with long-term current use of insulin (HCC) 11/13/2017   Type 2  diabetes mellitus with mild 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, bilateral (HCC) 03/24/2018   Type 2 diabetes mellitus with peripheral neuropathy (HCC) 11/04/2005   Urethral stricture in male 11/13/2017   Noted on cystoscopy 12/18/2014.  Reportedly asked to do chronic intermittent self catheterizations, but has not.   Past Surgical History:  Procedure Laterality Date   CARDIAC CATHETERIZATION  10/05/08   REVEALS HYPOKINESIS OF THE LATERAL WALL AND EF 50-55%   CORONARY ANGIOPLASTY WITH STENT PLACEMENT     CORONARY ARTERY BYPASS GRAFT N/A 01/05/2013   Procedure: CORONARY ARTERY BYPASS GRAFTING (CABG);  Surgeon: Loreli Slot, MD;  Location: [facility] OR;  Service: Open Heart Surgery;  Laterality: N/A;   INTRAOPERATIVE TRANSESOPHAGEAL ECHOCARDIOGRAM N/A 01/05/2013   Procedure: INTRAOPERATIVE TRANSESOPHAGEAL ECHOCARDIOGRAM;  Surgeon: Loreli Slot, MD;  Location: [facility] OR;  Service: Open Heart Surgery;  Laterality: N/A;   LEFT HEART CATHETERIZATION WITH CORONARY ANGIOGRAM N/A 01/03/2013   Procedure: LEFT HEART CATHETERIZATION WITH CORONARY ANGIOGRAM;  Surgeon: [name], MD;  Location: [facility] CATH LAB;  Service: Cardiovascular;  Laterality: N/A;   LOWER EXTREMITY ANGIOGRAM Right 12/07/2012   unsuccessful attempt at percutaneous revascularization of a calcified long segment chronic total occlusion mid left SFA /notes 12/07/2012   LOWER EXTREMITY ANGIOGRAM N/A 12/07/2012   Procedure: LOWER EXTREMITY ANGIOGRAM;  Surgeon: [name], MD;  Location: [facility] CATH LAB;  Service: Cardiovascular;  Laterality: N/A;   PROSTATE SURGERY  1990's    Allergies  No Known Allergies  EKGs/Labs/Other Studies Reviewed:   The following studies were reviewed today:  ABI 12/08/2018 Summary:  Right: Resting right ankle-brachial index indicates moderate right lower  extremity arterial disease. The right toe-brachial index is abnormal.   Left: Resting left ankle-brachial index indicates  moderate left lower  extremity arterial disease. The left toe-brachial index is abnormal.   EKG:  EKG is  ordered today.  The ekg ordered today demonstrates normal sinus rhythm with PACs  Recent Labs: 09/14/2021: ALT 15; Magnesium 1.9 09/18/2021: Hemoglobin 13.0; Platelets 213 09/01/2022: BUN 21; Creatinine, Ser 1.20; Potassium 3.7; Sodium 140  Recent Lipid Panel    Component Value Date/Time   CHOL 152 07/02/2022 0941   CHOL 143 09/18/2021 1049   TRIG 85.0 07/02/2022 0941   HDL 35.60 (L) 07/02/2022 0941   HDL 34 (L) 09/18/2021 1049   CHOLHDL 4 07/02/2022 0941   VLDL 17.0 07/02/2022 0941   LDLCALC 99 07/02/2022 0941   LDLCALC 91 09/18/2021 1049    Home Medications   Current Meds  Medication Sig   allopurinol (ZYLOPRIM) 100 MG tablet Take 2 tablets by mouth once daily   amLODipine (NORVASC) 2.5 MG tablet Take 2.5 mg by mouth daily.   aspirin EC 81 MG tablet Take 1 tablet (81 mg total) by mouth daily.   atorvastatin (LIPITOR) 40 MG tablet Take 1 tablet (40 mg total) by mouth daily.   blood glucose meter kit and supplies KIT Dispense based on patient and  insurance preference. Use up to four times daily as directed.   colchicine 0.6 MG tablet Take 1 tablet (0.6 mg total) by mouth 2 (two) times daily as needed (gout flare).   Dulaglutide (TRULICITY) 0.75 MG/0.5ML SOPN Inject 0.75 mg into the skin once a week.   furosemide (LASIX) 20 MG tablet Take 1 tablet (20 mg total) by mouth daily.   gabapentin (NEURONTIN) 100 MG capsule Take 200 mg by mouth 3 (three) times daily.   insulin glargine (LANTUS) 100 UNIT/ML injection Inject 0.08 mLs (8 Units total) into the skin daily.   insulin lispro (HUMALOG) 100 UNIT/ML injection Inject 0.08 mLs (8 Units total) into the skin 3 (three) times daily before meals. 8-10 units daily before meals   JARDIANCE 10 MG TABS tablet TAKE 1 TABLET BY MOUTH ONCE DAILY BEFORE BREAKFAST   loratadine (EQ LORATADINE) 10 MG tablet TAKE 1 TABLET BY MOUTH DAILY AS NEEDED  FOR RHINITIS. Strength: 10 mg   losartan (COZAAR) 50 MG tablet Take 1 tablet (50 mg total) by mouth daily.   metoprolol tartrate (LOPRESSOR) 25 MG tablet Take 2 tablets (50 mg total) by mouth 2 (two) times daily.   nitroGLYCERIN (NITROSTAT) 0.4 MG SL tablet Place 1 tablet (0.4 mg total) under the tongue every 5 (five) minutes as needed for chest pain.   pantoprazole (PROTONIX) 20 MG tablet Take 20 mg by mouth as needed for heartburn.   tamsulosin (FLOMAX) 0.4 MG CAPS capsule Take 1 capsule (0.4 mg total) by mouth daily.   trospium (SANCTURA) 20 MG tablet Take 20 mg by mouth in the morning and at bedtime.   Current Facility-Administered Medications for the 09/09/22 encounter (Office Visit) with Sharlene Dory, PA-C  Medication   Insulin Pen Needle (NOVOFINE) 100 each     Review of Systems      All other systems reviewed and are otherwise negative except as noted above.  Physical Exam    VS:  BP (!) 124/58   Pulse 62   Ht 6\' 1"  (1.854 m)   Wt 234 lb (106.1 kg)   SpO2 95%   BMI 30.87 kg/m  , BMI Body mass index is 30.87 kg/m.  Wt Readings from Last 3 Encounters:  09/09/22 234 lb (106.1 kg)  09/04/22 233 lb 9.6 oz (106 kg)  07/02/22 229 lb 6.4 oz (104.1 kg)     GEN: Well nourished, well developed, unkempt in appearance, no acute distress HEENT: normal. Neck: Supple, no JVD, carotid bruits, or masses. Cardiac: RRR, no murmurs, rubs, or gallops. No clubbing, cyanosis, edema.  Radials/PT 2+ and equal bilaterally.  Respiratory:  Respirations regular and unlabored, clear to auscultation bilaterally. GI: Soft, nontender, nondistended. MS: No deformity or atrophy. Skin: Warm and dry, no rash. Neuro:  Strength and sensation are intact. Psych: Normal affect.  Assessment & Plan    CAD/chest pain -chest discomfort on the left side, happened while writing and laying on his left side -We refilled his nitroglycerin but pain was likely musculoskeletal in nature.  Recommended Tylenol,  ice/heat and rest -Follow-up with PCP/orthopedics as needed -Continue GDMT: Aspirin 81 mg, Lipitor 40 mg, Lasix 20 mg daily, Cozaar 50 mg daily, metoprolol 25 mg twice daily  DM -He needs better control.  Last A1c was 8.2 -Defer treatment to primary care  HTN -BP well controlled today in the clinic 124/58 -He states its been well controlled on his doctor's offices but he does not take his blood pressure at home -Continue current medication regimen  HLD -LDL 99 which is above goal -Goal LDL less than 70 due to CAD -Work on dietary changes, may need to increase Lipitor  CHF -Euvolemic on exam -Continue current medication regimen         Disposition: Follow up 3-4 months with Kristeen Miss, MD or APP.  Signed, Sharlene Dory, PA-C 09/09/2022, 9:56 AM [facility]

## 2022-09-09 ENCOUNTER — Encounter: Payer: Self-pay | Admitting: Physician Assistant

## 2022-09-09 ENCOUNTER — Ambulatory Visit: Payer: Medicare Other | Attending: Physician Assistant | Admitting: Physician Assistant

## 2022-09-09 VITALS — BP 124/58 | HR 62 | Ht 73.0 in | Wt 234.0 lb

## 2022-09-09 DIAGNOSIS — E1142 Type 2 diabetes mellitus with diabetic polyneuropathy: Secondary | ICD-10-CM | POA: Diagnosis not present

## 2022-09-09 DIAGNOSIS — Z794 Long term (current) use of insulin: Secondary | ICD-10-CM

## 2022-09-09 DIAGNOSIS — Z7689 Persons encountering health services in other specified circumstances: Secondary | ICD-10-CM

## 2022-09-09 DIAGNOSIS — I2510 Atherosclerotic heart disease of native coronary artery without angina pectoris: Secondary | ICD-10-CM

## 2022-09-09 DIAGNOSIS — I5042 Chronic combined systolic (congestive) and diastolic (congestive) heart failure: Secondary | ICD-10-CM

## 2022-09-09 DIAGNOSIS — E785 Hyperlipidemia, unspecified: Secondary | ICD-10-CM | POA: Diagnosis not present

## 2022-09-09 DIAGNOSIS — I5020 Unspecified systolic (congestive) heart failure: Secondary | ICD-10-CM | POA: Diagnosis not present

## 2022-09-09 DIAGNOSIS — I5022 Chronic systolic (congestive) heart failure: Secondary | ICD-10-CM

## 2022-09-09 DIAGNOSIS — I739 Peripheral vascular disease, unspecified: Secondary | ICD-10-CM

## 2022-09-09 MED ORDER — NITROGLYCERIN 0.4 MG SL SUBL: 0.4000 mg | SUBLINGUAL_TABLET | SUBLINGUAL | 3 refills | Status: AC | PRN

## 2022-09-09 NOTE — Patient Instructions (Addendum)
Medication Instructions:   START TAKING:  NITROGLYCERIN 0.4 MG SUBLINGUAL AS NEEDED FOR CHEST PAIN   *If you need a refill on your cardiac medications before your next appointment, please call your pharmacy*   Lab Work: RETURN IN 3 MONTHS FOR  LFT AND LIPIDS   If you have labs (blood work) drawn today and your tests are completely normal, you will receive your results only by: MyChart Message (if you have MyChart) OR A paper copy in the mail If you have any lab test that is abnormal or we need to change your treatment, we will call you to review the results.   Testing/Procedures: NONE ORDERED  TODAY    Follow-Up: At [facility], you and your health needs are our priority.  As part of our continuing mission to provide you with exceptional heart care, we have created designated Provider Care Teams.  These Care Teams include your primary Cardiologist (physician) and Advanced Practice Providers (APPs -  Physician Assistants and Nurse Practitioners) who all work together to provide you with the care you need, when you need it.  We recommend signing up for the patient portal called "MyChart".  Sign up information is provided on this After Visit Summary.  MyChart is used to connect with patients for Virtual Visits (Telemedicine).  Patients are able to view lab/test results, encounter notes, upcoming appointments, etc.  Non-urgent messages can be sent to your provider as well.   To learn more about what you can do with MyChart, go to [email or web address].    Your next appointment:   3-4  month(s)  Provider:   Kristeen Miss, MD  /APP    Other Instructions

## 2022-09-11 ENCOUNTER — Telehealth (HOSPITAL_COMMUNITY): Payer: Self-pay | Admitting: Licensed Clinical Social Worker

## 2022-09-11 NOTE — Telephone Encounter (Signed)
CSW received request to follow up with patient regarding medication compliance. CSW spoke with patient's daughter who states that she visits patient and assists with daily needs. She reports that she struggles with the medications because he tells her "you are not a doctor" and sometimes challenges her on whether he should take or not. CSW discussed getting medications in blister packs which might make it easier for him to take and she agreed. She plans to reach out to his primary MD at [facility] to discuss and refer to pharmacy for blister packs.CSW left contact information for future need. Lasandra Beech, LCSW, CCSW-MCS [phone]

## 2022-09-18 ENCOUNTER — Ambulatory Visit (INDEPENDENT_AMBULATORY_CARE_PROVIDER_SITE_OTHER): Payer: Medicare Other | Admitting: Endocrinology

## 2022-09-18 ENCOUNTER — Encounter: Payer: Self-pay | Admitting: Endocrinology

## 2022-09-18 VITALS — BP 120/60 | HR 66 | Ht 73.0 in | Wt 234.0 lb

## 2022-09-18 DIAGNOSIS — E113293 Type 2 diabetes mellitus with mild 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, bilateral: Secondary | ICD-10-CM | POA: Diagnosis not present

## 2022-09-18 DIAGNOSIS — I10 Essential (primary) hypertension: Secondary | ICD-10-CM

## 2022-09-18 DIAGNOSIS — Z794 Long term (current) use of insulin: Secondary | ICD-10-CM | POA: Diagnosis not present

## 2022-09-18 MED ORDER — EMPAGLIFLOZIN 10 MG PO TABS
10.0000 mg | ORAL_TABLET | Freq: Every day | ORAL | 1 refills | Status: DC
Start: 2022-09-18 — End: 2022-10-15

## 2022-09-18 NOTE — Patient Instructions (Addendum)
Check blood sugars on waking up 3-4 days a week  Also check blood sugars about 2 hours after meals and do this after different meals by rotation  Recommended blood sugar levels on waking up are 90-130 and about 2 hours after meal is 130-160  Please bring your blood sugar monitor to each visit, thank you   Also 8 Lispro at lunch when eating at meal  Jardiance every am

## 2022-09-18 NOTE — Progress Notes (Signed)
Patient ID: [name] [name], male   DOB: [birth year], 89 y.o.   MRN: [id number]   Reason for Appointment: diabetes follow-up  History of Present Illness    DIABETES type II:  Diagnosis date: 1990  Previous history: He has been on insulin since 2005 Previously on Lantus and also subsequently basal bolus regimen but this was changed to NPH and regular insulin because of cost his A1c has been usually near 7% in the past He was switched from Lantus to NPH because of cost  Recent history:   INSULIN REGIMEN: Lispro 10 units 2 times daily, Lantus 10 units twice daily  Oral hypoglycemic drugs: Metformin ER 500 mg -- 2 bid, Jardiance 10 mg daily  Current blood sugar patterns and problems identified:  His A1c is slightly better at 8.2 compared to 8.9  He has been getting insulin with the syringe and vial instead of insulin pen because he prefers this Although he was supposed to take once a day Lantus 14 units he is not understanding the dose and is taking 10 units of both Lantus and lispro twice a day He does frequently eat lunch but does not take any insulin at that time Also he thinks he may be sometimes forgetting meals insulin doses and his fasting readings recently have been higher Occasionally fasting readings are fairly good Otherwise he is checking his blood sugars only before breakfast and dinnertime Most of his blood sugars at dinnertime are significantly higher His daughter feels that he needs more supervision to take care of his insulin regimen and medications Not able to do much walking because of shortness of breath Unclear whether he is taking his Jardiance regularly  .       Monitors blood glucose: <1 times a day.    Glucometer:   ACCU-CHEK       PRE-MEAL Fasting Lunch Dinner Bedtime Overall  Glucose range: 1 31-257  130-332    Mean/median:     186    Meals: 2-3 meals per day.  breakfast is eggs,oatmeal usually.   Breakfast 7 AM, lunch 1 PM and dinner  7-8 p.m.                 Dietician visit: Most recent:  02/2017     Weight control:   Wt Readings from Last 3 Encounters:  09/18/22 234 lb (106.1 kg)  09/09/22 234 lb (106.1 kg)  09/04/22 233 lb 9.6 oz (106 kg)           Diabetes labs:  Lab Results  Component Value Date   HGBA1C 8.2 (H) 09/01/2022   HGBA1C 8.9 (A) 07/02/2022   HGBA1C 7.9 (H) 02/04/2022   Lab Results  Component Value Date   MICROALBUR 4.7 (H) 02/04/2022   LDLCALC 99 07/02/2022   CREATININE 1.20 09/01/2022   Lab Results  Component Value Date   FRUCTOSAMINE 320 (H) 09/01/2022      Allergies as of 09/18/2022   No Known Allergies      Medication List        Accurate as of September 18, 2022 10:03 AM. If you have any questions, ask your nurse or doctor.          allopurinol 100 MG tablet Commonly known as: ZYLOPRIM Take 2 tablets by mouth once daily   amLODipine 2.5 MG tablet Commonly known as: NORVASC Take 2.5 mg by mouth daily.   aspirin EC 81 MG tablet Take 1 tablet (81 mg total) by mouth daily.  atorvastatin 40 MG tablet Commonly known as: LIPITOR Take 1 tablet (40 mg total) by mouth daily.   blood glucose meter kit and supplies Kit Dispense based on patient and insurance preference. Use up to four times daily as directed.   colchicine 0.6 MG tablet Take 1 tablet (0.6 mg total) by mouth 2 (two) times daily as needed (gout flare).   furosemide 20 MG tablet Commonly known as: LASIX Take 1 tablet (20 mg total) by mouth daily.   gabapentin 100 MG capsule Commonly known as: NEURONTIN Take 200 mg by mouth 3 (three) times daily.   insulin glargine 100 UNIT/ML injection Commonly known as: Lantus Inject 0.08 mLs (8 Units total) into the skin daily.   insulin lispro 100 UNIT/ML injection Commonly known as: HumaLOG Inject 0.08 mLs (8 Units total) into the skin 3 (three) times daily before meals. 8-10 units daily before meals   Jardiance 10 MG Tabs tablet Generic drug:  empagliflozin TAKE 1 TABLET BY MOUTH ONCE DAILY BEFORE BREAKFAST   loratadine 10 MG tablet Commonly known as: EQ Loratadine TAKE 1 TABLET BY MOUTH DAILY AS NEEDED FOR RHINITIS. Strength: 10 mg   losartan 50 MG tablet Commonly known as: COZAAR Take 1 tablet (50 mg total) by mouth daily.   metFORMIN 500 MG tablet Commonly known as: Glucophage Take 2 tablets (1,000 mg total) by mouth 2 (two) times daily with a meal.   metoprolol tartrate 25 MG tablet Commonly known as: LOPRESSOR Take 2 tablets (50 mg total) by mouth 2 (two) times daily.   nitroGLYCERIN 0.4 MG SL tablet Commonly known as: Nitrostat Place 1 tablet (0.4 mg total) under the tongue every 5 (five) minutes as needed for chest pain.   pantoprazole 20 MG tablet Commonly known as: PROTONIX Take 20 mg by mouth as needed for heartburn.   tamsulosin 0.4 MG Caps capsule Commonly known as: FLOMAX Take 1 capsule (0.4 mg total) by mouth daily.   trospium 20 MG tablet Commonly known as: SANCTURA Take 20 mg by mouth in the morning and at bedtime.   Trulicity 0.75 MG/0.5ML Sopn Generic drug: Dulaglutide Inject 0.75 mg into the skin once a week.        Allergies: No Known Allergies  Past Medical History:  Diagnosis Date   Benign prostatic hyperplasia (BPH) with urinary urge incontinence 11/13/2017   Chronic left shoulder pain 11/13/2017   Chronic non-seasonal allergic rhinitis 02/17/2006   Chronic systolic heart failure (HCC) 10/31/2016   Echo (05/03/2015): LVEF 45-50%, grade 2 diastolic dysfunction   Coronary artery disease involving native coronary artery of native heart without angina pectoris 09/30/2007   s/p CABG on 01/05/2013: left internal mammary artery to left anterior descending, saphenous vein graft to obtuse marginal 1, sequential saphenous vein graft to acute marginal and posterior descending   Essential hypertension 11/04/2005   Gastroesophageal reflux disease with hiatal hernia 11/04/2005   History of prostate  cancer 02/17/2006   Diagnosed 1995, s/p seed implantation in the late 1990's, PSA undetectable 04/10/2016   Hyperlipidemia 11/04/2005   Obesity (BMI 30.0-34.9) 11/13/2017   Peripheral arterial disease (HCC) 09/30/2007   with claudication, failed attempt at LLE PTCA   Type 2 diabetes mellitus with microalbuminuria, with long-term current use of insulin (HCC) 11/13/2017   Type 2 diabetes mellitus with mild 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, bilateral (HCC) 03/24/2018   Type 2 diabetes mellitus with peripheral neuropathy (HCC) 11/04/2005   Urethral stricture in male 11/13/2017   Noted on cystoscopy 12/18/2014.  Reportedly  asked to do chronic intermittent self catheterizations, but has not.    Past Surgical History:  Procedure Laterality Date   BREAST SURGERY     CARDIAC CATHETERIZATION  10/05/2008   REVEALS HYPOKINESIS OF THE LATERAL WALL AND EF 50-55%   CORONARY ANGIOPLASTY WITH STENT PLACEMENT     CORONARY ARTERY BYPASS GRAFT N/A 01/05/2013   Procedure: CORONARY ARTERY BYPASS GRAFTING (CABG);  Surgeon: Loreli Slot, MD;  Location: [facility];  Service: Open Heart Surgery;  Laterality: N/A;   INTRAOPERATIVE TRANSESOPHAGEAL ECHOCARDIOGRAM N/A 01/05/2013   Procedure: INTRAOPERATIVE TRANSESOPHAGEAL ECHOCARDIOGRAM;  Surgeon: Loreli Slot, MD;  Location: [facility] OR;  Service: Open Heart Surgery;  Laterality: N/A;   LEFT HEART CATHETERIZATION WITH CORONARY ANGIOGRAM N/A 01/03/2013   Procedure: LEFT HEART CATHETERIZATION WITH CORONARY ANGIOGRAM;  Surgeon: [name], MD;  Location: [facility] CATH LAB;  Service: Cardiovascular;  Laterality: N/A;   LOWER EXTREMITY ANGIOGRAM Right 12/07/2012   unsuccessful attempt at percutaneous revascularization of a calcified long segment chronic total occlusion mid left SFA /notes 12/07/2012   LOWER EXTREMITY ANGIOGRAM N/A 12/07/2012   Procedure: LOWER EXTREMITY ANGIOGRAM;  Surgeon: [name], MD;  Location: [facility] CATH LAB;  Service:  Cardiovascular;  Laterality: N/A;   PROSTATE SURGERY  10/04/1988    Family History  Problem Relation Age of Onset   Kidney failure Mother    Hypertension Father    Stroke Father    Other Sister    Other Brother    Other Brother    Other Brother    Other Brother    Other Brother    Other Brother    Other Brother    Other Brother    Healthy Daughter    Healthy Daughter    Healthy Daughter    Healthy Son     Social History:  reports that he quit smoking about 42 years ago. His smoking use included cigarettes. He started smoking about 62 years ago. He has a 15 pack-year smoking history. He has never used smokeless tobacco. He reports that he does not drink alcohol and does not use drugs.  Review of Systems:   Hypertension:  has had long-standing hypertension followed by his PCP and other physicians Blood pressure appears well controlled  BP Readings from Last 3 Encounters:  09/18/22 120/60  09/09/22 (!) 124/58  09/04/22 130/70    Lipids: Has been treated with Lipitor 40 mg for hypercholesterolemia   Lab Results  Component Value Date   CHOL 152 07/02/2022   HDL 35.60 (L) 07/02/2022   LDLCALC 99 07/02/2022   TRIG 85.0 07/02/2022   CHOLHDL 4 07/02/2022    Neuropathy: Has been treated with gabapentin 100 mg by his PCP  Claudication: Has pain in the lower legs when he is walking, more on the left calf, followed by cardiologist     Foot exam done in 5/23, has absent pedal pulses otherwise normal monofilament sensation   RENAL   He does not have microalbuminuria  Lab Results  Component Value Date   CREATININE 1.20 09/01/2022   CREATININE 1.32 07/02/2022   CREATININE 1.32 02/04/2022       LABS:  No visits with results within 1 Week(s) from this visit.  Latest known visit with results is:  Lab on 09/01/2022  Component Date Value Ref Range Status   Sodium 09/01/2022 140  135 - 145 mEq/L Final   Potassium 09/01/2022 3.7  3.5 - 5.1 mEq/L Final   Chloride  09/01/2022 106  96 -  112 mEq/L Final   CO2 09/01/2022 25  19 - 32 mEq/L Final   Glucose, Bld 09/01/2022 180 (H)  70 - 99 mg/dL Final   BUN 78/46/9629 21  6 - 23 mg/dL Final   Creatinine, Ser 09/01/2022 1.20  0.40 - 1.50 mg/dL Final   GFR 52/84/1324 53.84 (L)  >60.00 mL/min Final   Calculated using the CKD-EPI Creatinine Equation (2021)   Calcium 09/01/2022 9.1  8.4 - 10.5 mg/dL Final   Hgb M0N MFr Bld 09/01/2022 8.2 (H)  4.6 - 6.5 % Final   Glycemic Control Guidelines for People with Diabetes:Non Diabetic:  <6%Goal of Therapy: <7%Additional Action Suggested:  >8%    Fructosamine 09/01/2022 320 (H)  0 - 285 umol/L Final   Comment: Published reference interval for apparently healthy subjects between age 78 and 25 is 21 - 285 umol/L and in a poorly controlled diabetic population is 228 - 563 umol/L with a mean of 396 umol/L.      Examination:   BP 120/60   Pulse 66   Ht 6\' 1"  (1.854 m)   Wt 234 lb (106.1 kg)   SpO2 99%   BMI 30.87 kg/m   Body mass index is 30.87 kg/m.     ASSESSMENT/ PLAN:     Diabetes type 2 insulin-dependent:   His A1c is now 8.2 compared to 7.9   See history of present illness for detailed discussion of current diabetes management, blood sugar patterns and problems identified  Current regimen: Insulin Lantus and lisinopril twice a day, Jardiance and metformin  As above he has difficulty with mostly high readings but some variability based on his remembering to take his insulin injections Also his blood sugars are usually much higher at dinnertime mostly because of not taking coverage for lunch Again unlikely that he can handle doing the CGM even with help Also not understanding the need to sometimes check his sugars after meals Has limited ability to exercise  Discussed that he will also benefit from Jardiance and metformin and continue these doses Advised him that this medication will not cause headaches and he can discuss his other medication with  his PCP  Mild hypertension: Well-controlled  Recommendations:  He will start checking his blood sugars sometimes after breakfast or supper instead of just before breakfast and dinnertime Since he prefers the insulin syringes he will continue the regimen unchanged Given written instruction for him to start taking 8 units of insulin to cover his lunch unless skipping the meal Also he will keep a record of his insulin doses with a flowsheet provided so that he does not forget His daughter will help him with his Also discussed that if he decides to go on the FlexPen he will make sure he removed the needle after using the pen  He will bring meter for download on each visit    There are no Patient Instructions on file for this visit.    Total visit time including counseling = 30 minutes   Reather Littler 09/18/2022, 10:03 AM

## 2022-10-03 ENCOUNTER — Other Ambulatory Visit: Payer: Self-pay | Admitting: Internal Medicine

## 2022-10-03 ENCOUNTER — Other Ambulatory Visit: Payer: Self-pay | Admitting: Student

## 2022-10-03 DIAGNOSIS — I10 Essential (primary) hypertension: Secondary | ICD-10-CM

## 2022-10-06 ENCOUNTER — Encounter (HOSPITAL_COMMUNITY): Payer: Self-pay

## 2022-10-06 ENCOUNTER — Ambulatory Visit (HOSPITAL_COMMUNITY)
Admission: EM | Admit: 2022-10-06 | Discharge: 2022-10-06 | Disposition: A | Discharge status: Home / Self Care | Payer: Medicare Other | Attending: Internal Medicine | Admitting: Internal Medicine

## 2022-10-06 DIAGNOSIS — R519 Headache, unspecified: Secondary | ICD-10-CM | POA: Diagnosis present

## 2022-10-06 DIAGNOSIS — J0100 Acute maxillary sinusitis, unspecified: Secondary | ICD-10-CM | POA: Insufficient documentation

## 2022-10-06 DIAGNOSIS — Z1152 Encounter for screening for COVID-19: Secondary | ICD-10-CM | POA: Diagnosis not present

## 2022-10-06 DIAGNOSIS — R051 Acute cough: Secondary | ICD-10-CM | POA: Diagnosis not present

## 2022-10-06 MED ORDER — CETIRIZINE HCL 10 MG PO TABS
5.0000 mg | ORAL_TABLET | Freq: Every day | ORAL | 0 refills | Status: AC
Start: 2022-10-06 — End: 2023-10-30

## 2022-10-06 MED ORDER — AMOXICILLIN 500 MG PO CAPS
500.0000 mg | ORAL_CAPSULE | Freq: Two times a day (BID) | ORAL | 0 refills | Status: AC
Start: 2022-10-06 — End: 2022-10-13

## 2022-10-06 MED ORDER — BENZONATATE 100 MG PO CAPS: 100.0000 mg | ORAL_CAPSULE | Freq: Three times a day (TID) | ORAL | 0 refills | Status: DC

## 2022-10-06 NOTE — ED Triage Notes (Signed)
Patient here today with c/o cough, congestion, nasal drainage, headache, eye drainage, puffy eyes, eye redness X 4 days. He has taken Safe tussin for cough and congestion with some relief. He visits his wife in the nursing home and her roommate was sick with a cough.

## 2022-10-06 NOTE — ED Provider Notes (Signed)
[facility]    CSN: [id number] Arrival date & time: 10/06/22  1137      History   Chief Complaint Chief Complaint  Patient presents with   Cough    HPI Arhan Cervantes is a 89 y.o. male.   89 year old male who presents to urgent care with complaints of coughing, sore throat, mild headache with watering eyes that are starting to have a crust on them in the mornings.  He reports he cannot sleep at night due to this.  His symptoms began on August 27 and he has been trying to take an over-the-counter cough medicine which only helps during the day.  He has also been using an over-the-counter eyedrop for allergies which has helped his somewhat.  His wife is in a nursing facility and people have been sick there recently.  He did a home test for COVID about 5 days ago and it was negative but he feels his symptoms have gotten somewhat worse.  He denies fevers, chills, shortness of breath, chest pain, diarrhea, nausea, vomiting.  He does relate that he has some seasonal allergies and used to take loratadine.   Cough Associated symptoms: eye discharge and headaches (only mild)   Associated symptoms: no chest pain, no chills, no ear pain, no fever, no rash, no shortness of breath and no sore throat     Past Medical History:  Diagnosis Date   Benign prostatic hyperplasia (BPH) with urinary urge incontinence 11/13/2017   Chronic left shoulder pain 11/13/2017   Chronic non-seasonal allergic rhinitis 02/17/2006   Chronic systolic heart failure (HCC) 10/31/2016   Echo (05/03/2015): LVEF 45-50%, grade 2 diastolic dysfunction   Coronary artery disease involving native coronary artery of native heart without angina pectoris 09/30/2007   s/p CABG on 01/05/2013: left internal mammary artery to left anterior descending, saphenous vein graft to obtuse marginal 1, sequential saphenous vein graft to acute marginal and posterior descending   Essential hypertension 11/04/2005   Gastroesophageal  reflux disease with hiatal hernia 11/04/2005   History of prostate cancer 02/17/2006   Diagnosed 1995, s/p seed implantation in the late 1990's, PSA undetectable 04/10/2016   Hyperlipidemia 11/04/2005   Obesity (BMI 30.0-34.9) 11/13/2017   Peripheral arterial disease (HCC) 09/30/2007   with claudication, failed attempt at LLE PTCA   Type 2 diabetes mellitus with microalbuminuria, with long-term current use of insulin (HCC) 11/13/2017   Type 2 diabetes mellitus with mild 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, bilateral (HCC) 03/24/2018   Type 2 diabetes mellitus with peripheral neuropathy (HCC) 11/04/2005   Urethral stricture in male 11/13/2017   Noted on cystoscopy 12/18/2014.  Reportedly asked to do chronic intermittent self catheterizations, but has not.    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) 12/19/2020   Mild cognitive impairment 06/25/2020   Esophageal dysphagia 04/24/2020   Chronic gout 05/27/2018   Constipation 03/26/2018   Type 2 diabetes mellitus with mild 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, bilateral (HCC) 03/24/2018   Healthcare maintenance 11/13/2017   Chronic left shoulder pain 11/13/2017   Severe obesity (BMI 35.0-35.9 with comorbidity) (HCC) 11/13/2017   Urethral stricture in male 11/13/2017   Benign prostatic hyperplasia (BPH) with urinary urge incontinence 11/13/2017   Chronic combined systolic (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) 10/31/2016   Arthritis 12/19/2014   Coronary artery disease  involving native coronary artery of native heart without angina pectoris 09/30/2007   Peripheral arterial disease (HCC) 09/30/2007   Chronic non-seasonal allergic rhinitis 02/17/2006   History of prostate cancer 02/17/2006   Hyperlipidemia 11/04/2005    Essential hypertension 11/04/2005   Gastroesophageal reflux disease with hiatal hernia 11/04/2005    Past Surgical History:  Procedure Laterality Date   BREAST SURGERY     CARDIAC CATHETERIZATION  10/05/2008   REVEALS HYPOKINESIS OF THE LATERAL WALL AND EF 50-55%   CORONARY ANGIOPLASTY WITH STENT PLACEMENT     CORONARY ARTERY BYPASS GRAFT N/A 01/05/2013   Procedure: CORONARY ARTERY BYPASS GRAFTING (CABG);  Surgeon: Loreli Slot, MD;  Location: [facility] OR;  Service: Open Heart Surgery;  Laterality: N/A;   INTRAOPERATIVE TRANSESOPHAGEAL ECHOCARDIOGRAM N/A 01/05/2013   Procedure: INTRAOPERATIVE TRANSESOPHAGEAL ECHOCARDIOGRAM;  Surgeon: Loreli Slot, MD;  [facility];  Service: Open Heart Surgery;  Laterality: N/A;   LEFT HEART CATHETERIZATION WITH CORONARY ANGIOGRAM N/A 01/03/2013   Procedure: LEFT HEART CATHETERIZATION WITH CORONARY ANGIOGRAM;  Surgeon: [name], MD;  Location: [facility] CATH LAB;  Service: Cardiovascular;  Laterality: N/A;   LOWER EXTREMITY ANGIOGRAM Right 12/07/2012   unsuccessful attempt at percutaneous revascularization of a calcified long segment chronic total occlusion mid left SFA /notes 12/07/2012   LOWER EXTREMITY ANGIOGRAM N/A 12/07/2012   Procedure: LOWER EXTREMITY ANGIOGRAM;  Surgeon: [name], MD;  Location: [facility] CATH LAB;  Service: Cardiovascular;  Laterality: N/A;   PROSTATE SURGERY  10/04/1988       Home Medications    Prior to Admission medications   Medication Sig Start Date End Date Taking? Authorizing Provider  allopurinol (ZYLOPRIM) 100 MG tablet Take 2 tablets by mouth once daily 12/10/21   Inez Catalina, MD  amLODipine (NORVASC) 2.5 MG tablet Take 2.5 mg by mouth daily. 04/17/22   [provider]  aspirin EC 81 MG tablet Take 1 tablet (81 mg total) by mouth daily. 10/31/16   Nahser, Deloris Ping, MD  atorvastatin (LIPITOR) 40 MG tablet Take 1 tablet (40 mg total) by mouth daily. 07/29/22   Nahser, Deloris Ping, MD  blood  glucose meter kit and supplies KIT Dispense based on patient and insurance preference. Use up to four times daily as directed. 11/08/21   Adron Bene, MD  colchicine 0.6 MG tablet Take 1 tablet (0.6 mg total) by mouth 2 (two) times daily as needed (gout flare). 09/23/21   Inez Catalina, MD  empagliflozin (JARDIANCE) 10 MG TABS tablet Take 1 tablet (10 mg total) by mouth daily before breakfast. 09/18/22   Reather Littler, MD  furosemide (LASIX) 20 MG tablet Take 1 tablet (20 mg total) by mouth daily. 08/05/21   Rana Snare, DO  gabapentin (NEURONTIN) 100 MG capsule Take 200 mg by mouth 3 (three) times daily. 06/30/22   [provider]  insulin glargine (LANTUS) 100 UNIT/ML injection Inject 0.08 mLs (8 Units total) into the skin daily. 08/13/22   Reather Littler, MD  insulin lispro (HUMALOG) 100 UNIT/ML injection Inject 0.08 mLs (8 Units total) into the skin 3 (three) times daily before meals. 8-10 units daily before meals 08/13/22   Reather Littler, MD  loratadine (EQ LORATADINE) 10 MG tablet TAKE 1 TABLET BY MOUTH DAILY AS NEEDED FOR RHINITIS. Strength: 10 mg 08/05/21   Rana Snare, DO  losartan (COZAAR) 50 MG tablet Take 1 tablet (50 mg total) by mouth daily. 10/18/20   Inez Catalina, MD  metFORMIN (GLUCOPHAGE) 500 MG tablet Take  2 tablets (1,000 mg total) by mouth 2 (two) times daily with a meal. 11/29/21 09/18/22  Modena Slater, DO  metoprolol tartrate (LOPRESSOR) 25 MG tablet Take 2 tablets (50 mg total) by mouth 2 (two) times daily. 09/18/21   Gwenevere Abbot, MD  nitroGLYCERIN (NITROSTAT) 0.4 MG SL tablet Place 1 tablet (0.4 mg total) under the tongue every 5 (five) minutes as needed for chest pain. 09/09/22   Sharlene Dory, PA-C  pantoprazole (PROTONIX) 20 MG tablet Take 20 mg by mouth as needed for heartburn.    [provider]  tamsulosin (FLOMAX) 0.4 MG CAPS capsule Take 1 capsule (0.4 mg total) by mouth daily. 09/14/20   Inez Catalina, MD  trospium (SANCTURA) 20 MG tablet Take 20 mg by  mouth in the morning and at bedtime. 05/24/21   [provider]    Family History Family History  Problem Relation Age of Onset   Kidney failure Mother    Hypertension Father    Stroke Father    Other Sister    Other Brother    Other Brother    Other Brother    Other Brother    Other Brother    Other Brother    Other Brother    Other Brother    Healthy Daughter    Healthy Daughter    Healthy Daughter    Healthy Son     Social History Social History   Tobacco Use   Smoking status: Former    Current packs/day: 0.00    Average packs/day: 0.8 packs/day for 20.0 years (15.0 ttl pk-yrs)    Types: Cigarettes    Start date: 07/08/1960    Quit date: 07/08/1980    Years since quitting: 42.2   Smokeless tobacco: Never  Vaping Use   Vaping status: Never Used  Substance Use Topics   Alcohol use: No    Alcohol/week: 0.0 standard drinks of alcohol    Comment: 12/07/2012 "quit drinking alcohol > 25 yr ago"   Drug use: No     Allergies   Patient has no known allergies.   Review of Systems Review of Systems  Constitutional:  Negative for chills and fever.  HENT:  Positive for congestion, facial swelling and sinus pressure. Negative for ear pain, sore throat and trouble swallowing.   Eyes:  Positive for pain, discharge, redness and itching. Negative for photophobia and visual disturbance.  Respiratory:  Positive for cough. Negative for shortness of breath.   Cardiovascular:  Negative for chest pain and palpitations.  Gastrointestinal:  Negative for abdominal pain and vomiting.  Genitourinary:  Negative for dysuria and hematuria.  Musculoskeletal:  Negative for arthralgias and back pain.  Skin:  Negative for color change and rash.  Neurological:  Positive for headaches (only mild). Negative for seizures and syncope.  All other systems reviewed and are negative.    Physical Exam Triage Vital Signs ED Triage Vitals [10/06/22 1202]  Encounter Vitals Group     BP  127/72     Systolic BP Percentile      Diastolic BP Percentile      Pulse Rate 82     Resp 16     Temp 98.3 F (36.8 C)     Temp Source Oral     SpO2 94 %     Weight 240 lb (108.9 kg)     Height 6\' 1"  (1.854 m)     Head Circumference      Peak Flow  Pain Score 0     Pain Loc      Pain Education      Exclude from Growth Chart    No data found.  Updated Vital Signs BP 127/72 (BP Location: Right Arm)   Pulse 82   Temp 98.3 F (36.8 C) (Oral)   Resp 16   Ht 6\' 1"  (1.854 m)   Wt 240 lb (108.9 kg)   SpO2 94%   BMI 31.66 kg/m   Visual Acuity Right Eye Distance:   Left Eye Distance:   Bilateral Distance:    Right Eye Near:   Left Eye Near:    Bilateral Near:     Physical Exam Vitals and nursing note reviewed.  Constitutional:      General: He is not in acute distress.    Appearance: He is well-developed.  HENT:     Head: Normocephalic and atraumatic.      Right Ear: Tympanic membrane normal.     Left Ear: Tympanic membrane normal.     Nose: Congestion present.     Comments: Maxillary sinus tenderness and bogginess    Mouth/Throat:     Pharynx: Posterior oropharyngeal erythema present.  Eyes:     General: Allergic shiner present.     Extraocular Movements: Extraocular movements intact.     Conjunctiva/sclera:     Right eye: Right conjunctiva is injected.     Left eye: Left conjunctiva is injected.     Comments: Mild swelling around the eyelids without erythema  Cardiovascular:     Rate and Rhythm: Normal rate and regular rhythm.     Heart sounds: No murmur heard. Pulmonary:     Effort: Pulmonary effort is normal. No respiratory distress.     Breath sounds: Normal breath sounds.  Abdominal:     Palpations: Abdomen is soft.     Tenderness: There is no abdominal tenderness.  Musculoskeletal:        General: No swelling.     Cervical back: Neck supple.     Comments: Uses a cane for mobilization  Skin:    General: Skin is warm and dry.     Capillary  Refill: Capillary refill takes less than 2 seconds.  Neurological:     Mental Status: He is alert.  Psychiatric:        Mood and Affect: Mood normal.      UC Treatments / Results  Labs (all labs ordered are listed, but only abnormal results are displayed) Labs Reviewed - No data to display  EKG   Radiology No results found.  Procedures Procedures (including critical care time)  Medications Ordered in UC Medications - No data to display  Initial Impression / Assessment and Plan / UC Course  I have reviewed the triage vital signs and the nursing notes.  Pertinent labs & imaging results that were available during my care of the patient were reviewed by me and considered in my medical decision making (see chart for details).     Acute non-recurrent maxillary sinusitis  Acute cough   Given the patient's physical exam findings and symptom progression, this may be a combination of acute maxillary sinusitis with flare up of seasonal allergies. Will start amoxicillin for sinusitis. May use tessalon for cough. Would benefit from starting zyrtec for seasonal allergies. Ok to continue using OTC allergy eye drops. The patient's symptoms have worsened in the last 5 days so we will recheck covid also.  The patient knows to return to clinic if his  symptoms worsen or fail to resolve. Final Clinical Impressions(s) / UC Diagnoses   Final diagnoses:  None   Discharge Instructions   None    ED Prescriptions   None    PDMP not reviewed this encounter.   Landis Martins, [place] 10/06/22 1248

## 2022-10-06 NOTE — Discharge Instructions (Addendum)
Start Amoxicillin twice daily with food. May use tessalon every 8 hours as needed for cough. Zyrtec daily to help with seasonal allergies. May continue to use allergic eye drops. Return to clinic if symptoms worsen or fail to improve.   Covid test results will be available on mychart in about 12-24 hours. If positive we will call you.

## 2022-10-07 LAB — SARS CORONAVIRUS 2 (TAT 6-24 HRS): SARS Coronavirus 2: NEGATIVE

## 2022-10-10 ENCOUNTER — Telehealth: Payer: Self-pay

## 2022-10-10 MED ORDER — "INSULIN SYRINGE-NEEDLE U-100 31G X 5/16"" 0.3 ML MISC": 5 refills | Status: DC

## 2022-10-10 NOTE — Telephone Encounter (Signed)
Patient came in office today to get instruction on how to check his blood sugar with his meter and also to clarify how he is suppose to be taking his insulin. Patient was shown 3 times how to use the meter but still didn't understand fully. Patient was given another print out of his medication and how he is suppose to take them. Patient was given directions verbally as well to take Lantus  Inject 0.08 mLs (8 Units total) into the skin daily and Insulin Lispro  Inject 0.08 mLs (8 Units total) into the skin 3 (three) times daily before meals. 8-10 units daily before meals . Patient advises to go to [facility] to pick up more syringes to draw up his insulin. Patient daughter Okey Dupre has been called and awaiting a callback to go over instruction with her as well. Patient states that he drove him self today.  Patient last saw Dr. Lucianne Muss on 09/18/22 and will see new provider on 12/04/22.

## 2022-10-10 NOTE — Telephone Encounter (Signed)
Ladies if we can attempted to contact patient daughter again before we leave to make sure she understand insulin directions as well.

## 2022-10-13 NOTE — Telephone Encounter (Signed)
I called and spoke with his Daughter Boykin Reaper (on [place]) I was calling to see if he took the instructions home and spoke with his daughter that lives here locally and Boykin Reaper advised to me that she would like her father seen sooner because his blood sugar has been high over the past few weeks and she is wanting to know if we could start him on a Pump to help him. She is also going to follow up with her sister that lives here. Please see if we can get this patient in sooner than October, and fwd this message to Dr. Erroll Luna.

## 2022-10-14 ENCOUNTER — Other Ambulatory Visit: Payer: Self-pay

## 2022-10-14 ENCOUNTER — Emergency Department (HOSPITAL_COMMUNITY): Payer: Medicare Other

## 2022-10-14 ENCOUNTER — Encounter (HOSPITAL_COMMUNITY): Payer: Self-pay | Admitting: Emergency Medicine

## 2022-10-14 ENCOUNTER — Emergency Department (HOSPITAL_COMMUNITY)
Admission: EM | Admit: 2022-10-14 | Discharge: 2022-10-14 | Disposition: A | Discharge status: Home / Self Care | Payer: Medicare Other | Attending: Emergency Medicine | Admitting: Emergency Medicine

## 2022-10-14 DIAGNOSIS — R4182 Altered mental status, unspecified: Secondary | ICD-10-CM | POA: Diagnosis not present

## 2022-10-14 DIAGNOSIS — R350 Frequency of micturition: Secondary | ICD-10-CM | POA: Diagnosis present

## 2022-10-14 DIAGNOSIS — I509 Heart failure, unspecified: Secondary | ICD-10-CM | POA: Diagnosis not present

## 2022-10-14 DIAGNOSIS — R531 Weakness: Secondary | ICD-10-CM | POA: Diagnosis not present

## 2022-10-14 DIAGNOSIS — N390 Urinary tract infection, site not specified: Secondary | ICD-10-CM | POA: Insufficient documentation

## 2022-10-14 DIAGNOSIS — I2510 Atherosclerotic heart disease of native coronary artery without angina pectoris: Secondary | ICD-10-CM | POA: Insufficient documentation

## 2022-10-14 DIAGNOSIS — E119 Type 2 diabetes mellitus without complications: Secondary | ICD-10-CM | POA: Diagnosis not present

## 2022-10-14 LAB — URINALYSIS, ROUTINE W REFLEX MICROSCOPIC
Bilirubin Urine: NEGATIVE
Glucose, UA: 250 mg/dL — AB
Ketones, ur: NEGATIVE mg/dL
Leukocytes,Ua: NEGATIVE
Nitrite: NEGATIVE
Protein, ur: 100 mg/dL — AB
Specific Gravity, Urine: >1.03 — ABNORMAL HIGH (ref 1.005–1.030)
pH: 6 (ref 5.0–8.0)

## 2022-10-14 LAB — CBC WITH DIFFERENTIAL/PLATELET
Abs Immature Granulocytes: 0.06 10*3/uL (ref 0.00–0.07)
Basophils Absolute: 0.1 10*3/uL (ref 0.0–0.1)
Basophils Relative: 1 %
Eosinophils Absolute: 0.2 10*3/uL (ref 0.0–0.5)
Eosinophils Relative: 2 %
HCT: 41.5 % (ref 39.0–52.0)
Hemoglobin: 13.6 g/dL (ref 13.0–17.0)
Immature Granulocytes: 1 %
Lymphocytes Relative: 23 %
Lymphs Abs: 2.8 10*3/uL (ref 0.7–4.0)
MCH: 30.9 pg (ref 26.0–34.0)
MCHC: 32.8 g/dL (ref 30.0–36.0)
MCV: 94.3 fL (ref 80.0–100.0)
Monocytes Absolute: 0.7 10*3/uL (ref 0.1–1.0)
Monocytes Relative: 6 %
Neutro Abs: 8.3 10*3/uL — ABNORMAL HIGH (ref 1.7–7.7)
Neutrophils Relative %: 67 %
Platelets: 259 10*3/uL (ref 150–400)
RBC: 4.4 MIL/uL (ref 4.22–5.81)
RDW: 12.1 % (ref 11.5–15.5)
WBC: 12.2 10*3/uL — ABNORMAL HIGH (ref 4.0–10.5)
nRBC: 0 % (ref 0.0–0.2)

## 2022-10-14 LAB — URINALYSIS, MICROSCOPIC (REFLEX)

## 2022-10-14 LAB — COMPREHENSIVE METABOLIC PANEL
ALT: 21 U/L (ref 0–44)
AST: 20 U/L (ref 15–41)
Albumin: 3.2 g/dL — ABNORMAL LOW (ref 3.5–5.0)
Alkaline Phosphatase: 90 U/L (ref 38–126)
Anion gap: 10 (ref 5–15)
BUN: 12 mg/dL (ref 8–23)
CO2: 24 mmol/L (ref 22–32)
Calcium: 8.9 mg/dL (ref 8.9–10.3)
Chloride: 103 mmol/L (ref 98–111)
Creatinine, Ser: 1.25 mg/dL — ABNORMAL HIGH (ref 0.61–1.24)
GFR, Estimated: 55 mL/min — ABNORMAL LOW (ref >60)
Glucose, Bld: 286 mg/dL — ABNORMAL HIGH (ref 70–99)
Potassium: 4.2 mmol/L (ref 3.5–5.1)
Sodium: 137 mmol/L (ref 135–145)
Total Bilirubin: 0.8 mg/dL (ref 0.3–1.2)
Total Protein: 7.4 g/dL (ref 6.5–8.1)

## 2022-10-14 LAB — CBG MONITORING, ED: Glucose-Capillary: 273 mg/dL — ABNORMAL HIGH (ref 70–99)

## 2022-10-14 MED ORDER — CEPHALEXIN 250 MG PO CAPS
500.0000 mg | ORAL_CAPSULE | Freq: Once | ORAL | Status: AC
  Administered 2022-10-14: 500 mg via ORAL
  Filled 2022-10-14: qty 2

## 2022-10-14 MED ORDER — CEPHALEXIN 500 MG PO CAPS: 500.0000 mg | ORAL_CAPSULE | Freq: Four times a day (QID) | ORAL | 0 refills | Status: DC

## 2022-10-14 NOTE — Care Management (Addendum)
Transition of Care [facility]) - Emergency Department Mini Assessment   Patient Details  Name: [name] [name] MRN: [id number] Date of Birth: [birth year]  Transition of Care [facility]) CM/SW Contact:    Lavenia Atlas, RN Phone Number: 10/14/2022, 6:58 PM   Clinical Narrative: Received TOC consult for HH/ medication/insulin assistance. This RNCM spoke with patient's son Coolidge Breeze  (ph# [phone]). Bo reports he lives out of town and his sister Okey Dupre lives in [place]. Patient's wife currently in [facility] due to a stroke. Bo reports patient needs someone to come to the home daily to help patient with his medications/insulin. This RNCM explained that is private duty nursing which insurance doesn't cover and is an out of pocket expense. This RNCM explained difference between [facility] services and private duty nursing. Patient's son Morton Peters request a call back once his sister is in the room.   This RNCM contacted multiple HH agencies, see below:  Lynette w/[facility]: unable to accept pt's insurance Amy w/[facility]: unable to accept pt's insurance Angie w/[facility]: awaiting response - This RNCM left a voicemail for patient's daughter Okey Dupre and called room however line is busy.  TOC following for needs.     - 7:19pm This RNCM spoke with patient's daughter rose who reports patient's PCP- [employer] is already working on [facility] services. This RNCM advised patient's insurance is a barrier to getting [facility] services established. This RNCM is awaiting one agency to advise if able to accept patient. Patient's daughter reports she has started the [facility] application process 2 weeks ago. Patient will not allow family to assist him with his medications and only wants a male non family member/stranger to assist him. This RNCM explained if no HH agency accepts his insurance to continue to follow up with PCP.  TOC following  - 7:26pm Angie with [facility] declined due to unable to staff patient for medication management.  Notified  EDP  ED Mini Assessment: What brought you to the Emergency Department? : AMS  Barriers to Discharge: Continued Medical Work up  [employer] interventions: assist with coordinating HH services     Interventions which prevented an admission or readmission: Home Health Consult or Services    Patient Contact and Communications Key Contact 1: Coolidge Breeze     Contact Date: 10/14/22,   Contact time: 1856 Contact Phone Number: [phone]    Patient states their goals for this hospitalization and ongoing recovery are:: obtain [facility] services CMS Medicare.gov Compare Post Acute Care list provided to:: Patient Represenative (must comment) Choice offered to / list presented to : Adult Children Coolidge Breeze (son))  Admission diagnosis:  Weakness 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) 12/19/2020   Mild cognitive impairment 06/25/2020   Esophageal dysphagia 04/24/2020   Chronic gout 05/27/2018   Constipation 03/26/2018   Type 2 diabetes mellitus with mild 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, bilateral (HCC) 03/24/2018   Healthcare maintenance 11/13/2017   Chronic left shoulder pain 11/13/2017   Severe obesity (BMI 35.0-35.9 with comorbidity) (HCC) 11/13/2017   Urethral stricture in male 11/13/2017   Benign prostatic hyperplasia (BPH) with urinary urge incontinence 11/13/2017   Chronic combined systolic (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) 10/31/2016   Arthritis 12/19/2014   Coronary artery disease involving native coronary artery of native heart without angina pectoris 09/30/2007   Peripheral arterial disease (HCC)  09/30/2007   Chronic non-seasonal allergic rhinitis 02/17/2006   History of prostate cancer 02/17/2006   Hyperlipidemia 11/04/2005   Essential hypertension 11/04/2005    Gastroesophageal reflux disease with hiatal hernia 11/04/2005   PCP:  Patient, No Pcp Per Pharmacy:   [facility] [address] [place], [place] - [address] [place] [address] Phone: [phone] Fax: [phone]  [facility] [address] [place], [place] - [address] [place] [address] Phone: [phone] Fax: [phone]  [facility] [address] [place] [address] Phone: [phone] Fax: [phone]

## 2022-10-14 NOTE — ED Provider Notes (Signed)
[name] [name] Provider Note   CSN: [id number] Arrival date & time: 10/14/22  1301     History Chief Complaint  Patient presents with   Weakness    HPI [name] [name] is a 89 y.o. male presenting for altered mental status and generalized weakness. The patient presents with concerns about possible insulin misuse and a potential urinary tract infection. He reports increased urinary frequency and difficulty remembering how to take his insulin properly. The patient's daughter states that he has been experiencing confusion for approximately two months, which has affected his ability to manage his medications, including insulin administration. The patient is currently taking Lispro insulin up to three times a day, with 10-12 units in the morning and 8 units in the evening if he consumes a heavy meal. The patient does not report any pain or discomfort.  Medical history of hyperlipidemia, CAD, diabetes, heart failure with reduced ejection fraction Patient's recorded medical, surgical, social, medication list and allergies were reviewed in the Snapshot window as part of the initial history.   Review of Systems   Review of Systems  Constitutional:  Negative for chills and fever.  HENT:  Negative for ear pain and sore throat.   Eyes:  Negative for pain and visual disturbance.  Respiratory:  Negative for cough and shortness of breath.   Cardiovascular:  Negative for chest pain and palpitations.  Gastrointestinal:  Negative for abdominal pain and vomiting.  Genitourinary:  Negative for dysuria and hematuria.  Musculoskeletal:  Negative for arthralgias and back pain.  Skin:  Negative for color change and rash.  Neurological:  Negative for seizures and syncope.  Psychiatric/Behavioral:  Positive for confusion, decreased concentration and sleep disturbance.   All other systems reviewed and are negative.   Physical Exam Updated Vital Signs BP 120/73  (BP Location: Right Arm)   Pulse 85   Temp 97.8 F (36.6 C) (Oral)   Resp 20   Ht 6\' 1"  (1.854 m)   Wt 108.9 kg   SpO2 95%   BMI 31.66 kg/m  Physical Exam Vitals and nursing note reviewed.  Constitutional:      General: He is not in acute distress.    Appearance: He is well-developed.  HENT:     Head: Normocephalic and atraumatic.  Eyes:     Conjunctiva/sclera: Conjunctivae normal.  Cardiovascular:     Rate and Rhythm: Normal rate and regular rhythm.     Heart sounds: No murmur heard. Pulmonary:     Effort: Pulmonary effort is normal. No respiratory distress.     Breath sounds: Normal breath sounds.  Abdominal:     Palpations: Abdomen is soft.     Tenderness: There is no abdominal tenderness.  Musculoskeletal:        General: No swelling.     Cervical back: Neck supple.  Skin:    General: Skin is warm and dry.     Capillary Refill: Capillary refill takes less than 2 seconds.  Neurological:     General: No focal deficit present.     Mental Status: He is alert and oriented to person, place, and time.     Comments: Patient very slow to answer questions frequently tangential requiring redirection to the topic at hand.  Psychiatric:        Mood and Affect: Mood normal.      ED Course/ Medical Decision Making/ A&P    Procedures Procedures   Medications Ordered in ED Medications  cephALEXin (KEFLEX) capsule  500 mg (has no administration in time range)    Medical Decision Making:    [name] is a 89 y.o. male who presented to the ED today with multiple complaints detailed above.     Initial Assessment:   Patient primarily with 2 chief complaints.  Primarily they are concerned about his mismanagement of insulin at home.  He is seems to be taking 10 to 12 units of lispro at a time and forgetting to take his Lantus.  Daughter is concerned that he will hurt himself like this.  He does have follow-up with his diabetic coordinator tomorrow Concerning this  condition, I will recommend that family predose out his insulin and help him manage his insulin for the next 24 hours and deferring long-term management in the setting of cognitive impairment that appears to be developing to the diabetic coordinator for safe administration of this medication. Concerning his altered mental status again this seems more consistent with cognitive impairment given the 61-month clinical course.  With acute confusion today, frequent urination, urinary tract infection would be on the differentialAs well. Metabolic disruption anemia etc. also considered but again less likely. Gross metabolic disruption less likely as his blood sugar is 286.  I have warned the family about sequelae of hypoglycemia including potential life threat if he overdoses on insulin and they have expressed understanding.   Initial Plan:  Screening labs including CBC and Metabolic panel to evaluate for infectious or metabolic etiology of disease.  Urinalysis with reflex culture ordered to evaluate for UTI or relevant urologic/nephrologic pathology.  CXR to evaluate for structural/infectious intrathoracic pathology.  EKG to evaluate for cardiac pathology. Objective evaluation as below reviewed with plan for close reassessment  Initial Study Results:   Laboratory  All laboratory results reviewed without evidence of clinically relevant pathology.   Exceptions include: Hyperglycemia, bacteriuria, glucosuria, leukocyturia  EKG EKG was reviewed independently. Rate, rhythm, axis, intervals all examined and without medically relevant abnormality. ST segments without concerns for elevations.    Radiology  All images reviewed independently. Agree with radiology report at this time.   DG Chest 1 View  Result Date: 10/14/2022 CLINICAL DATA:  Pain EXAM: CHEST  1 VIEW COMPARISON:  X-ray 09/13/2021 FINDINGS: Sternal wires. No consolidation, pneumothorax or effusion. No edema. Normal cardiopericardial silhouette.  Calcified aorta. There is some left basilar atelectasis. Degenerative changes along the spine. IMPRESSION: Postop chest.  Left basilar scar or atelectasis. Electronically Signed   By: Karen Kays M.D.   On: 10/14/2022 15:09      Reassessment and Plan:   Given patient's complaint of polyuria and dysuria he likely has a urinary tract infection given his slight leukocytosis and reported delirium per family. Will treat with Keflex and recommend follow-up with primary provider in outpatient setting for ongoing care and management. He is in no acute distress social work has been in contact with family about long-term care and is in agreement.   Disposition:  I have considered need for hospitalization, however, considering all of the above, I believe this patient is stable for discharge at this time.  Patient/family educated about specific return precautions for given chief complaint and symptoms.  Patient/family educated about follow-up with PCP.     Patient/family expressed understanding of return precautions and need for follow-up. Patient spoken to regarding all imaging and laboratory results and appropriate follow up for these results. All education provided in verbal form with additional information in written form. Time was allowed for answering of patient questions. Patient  discharged.    Emergency Department Medication Summary:   Medications  cephALEXin (KEFLEX) capsule 500 mg (has no administration in time range)     Clinical Impression:  1. Urinary tract infection without hematuria, site unspecified      Discharge   Final Clinical Impression(s) / ED Diagnoses Final diagnoses:  Urinary tract infection without hematuria, site unspecified    Rx / DC Orders ED Discharge Orders          Ordered    cephALEXin (KEFLEX) 500 MG capsule  4 times daily        10/14/22 2201              Glyn Ade, MD 10/14/22 2203

## 2022-10-14 NOTE — ED Provider Triage Note (Signed)
Emergency Medicine Provider Triage Evaluation Note  [name] [name] , a 89 y.o. male  was evaluated in triage.  Pt complains of essentially nothing but family is concerned, reportedly, of changes in behavior, fatigue, energy..  Review of Systems  Positive: As above Negative: Pain  Physical Exam  BP 119/78 (BP Location: Right Arm)   Pulse 86   Temp 97.7 F (36.5 C) (Oral)   Resp 18   SpO2 98%  Gen:   Awake, no distress speaking clearly Resp:  Normal effort no increased work of breathing MSK:   Moves extremities without difficulty no deformity Other:  Psych calm neuro grossly intact  Medical Decision Making  Medically screening exam initiated at 1:08 PM.  Appropriate orders placed.  [name] [name] was informed that the remainder of the evaluation will be completed by another provider, this initial triage assessment does not replace that evaluation, and the importance of remaining in the ED until their evaluation is complete.   Gerhard Munch, MD 10/14/22 1308

## 2022-10-14 NOTE — ED Triage Notes (Signed)
Pt BIB [facility]. Per family pt has been confused and aggressive at home. At this time patient is alert and oriented. Pt reports his family is trying to give him his medications at home but he doesn't want them.

## 2022-10-15 ENCOUNTER — Telehealth: Payer: Self-pay | Admitting: Endocrinology

## 2022-10-15 ENCOUNTER — Ambulatory Visit (INDEPENDENT_AMBULATORY_CARE_PROVIDER_SITE_OTHER): Payer: Medicare Other | Admitting: Endocrinology

## 2022-10-15 ENCOUNTER — Encounter: Payer: Self-pay | Admitting: Endocrinology

## 2022-10-15 VITALS — BP 115/60 | HR 71 | Ht 73.0 in | Wt 226.6 lb

## 2022-10-15 DIAGNOSIS — E1142 Type 2 diabetes mellitus with diabetic polyneuropathy: Secondary | ICD-10-CM | POA: Diagnosis not present

## 2022-10-15 DIAGNOSIS — E1165 Type 2 diabetes mellitus with hyperglycemia: Secondary | ICD-10-CM

## 2022-10-15 DIAGNOSIS — Z7984 Long term (current) use of oral hypoglycemic drugs: Secondary | ICD-10-CM

## 2022-10-15 DIAGNOSIS — Z794 Long term (current) use of insulin: Secondary | ICD-10-CM

## 2022-10-15 DIAGNOSIS — E119 Type 2 diabetes mellitus without complications: Secondary | ICD-10-CM

## 2022-10-15 LAB — GLUCOSE, POCT (MANUAL RESULT ENTRY): POC Glucose: 411 mg/dL — AB (ref 70–99)

## 2022-10-15 MED ORDER — INSULIN GLARGINE 100 UNIT/ML ~~LOC~~ SOLN: 15.0000 [IU] | Freq: Every day | SUBCUTANEOUS | 11 refills | Status: DC

## 2022-10-15 MED ORDER — PUSH BUTTON SAFETY LANCETS 28G MISC: 1.0000 | Freq: Three times a day (TID) | 0 refills | Status: DC

## 2022-10-15 MED ORDER — "INSULIN SYRINGE-NEEDLE U-100 31G X 5/16"" 0.3 ML MISC": 5 refills | Status: DC

## 2022-10-15 MED ORDER — INSULIN LISPRO 100 UNIT/ML IJ SOLN: 8.0000 [IU] | Freq: Three times a day (TID) | INTRAMUSCULAR | 11 refills | Status: DC

## 2022-10-15 NOTE — Progress Notes (Signed)
Outpatient Endocrinology Note [name] Jaileen Janelle, MD  10/18/22  Patient's Name: [name] [name]    DOB: 03-24-1931    MRN: [id number]                                                    REASON OF VISIT: Follow up for type 2 diabetes mellitus  PCP: Patient, No Pcp Per  HISTORY OF PRESENT ILLNESS:   Isadore Labat is a 89 y.o. old male with past medical history listed below, is here for follow up of type 2 diabetes mellitus.   Pertinent Diabetes History: Patient was diagnosed with type 2 diabetes mellitus in 1990.  He has been on insulin therapy since 2005.  He was on NPH and regular insulin in the past and also basal bolus regimen.  He had usually controlled diabetes mellitus with hemoglobin A1c near 7% in the past.  Chronic Diabetes Complications : Retinopathy: yes. Following with ophthalmology. Nephropathy: CKD, on losartan Peripheral neuropathy: yes, on gabapentin Coronary artery disease: yes, s/p CABG Stroke: no  Relevant comorbidities and cardiovascular risk factors: Obesity: no Body mass index is 29.9 kg/m.  Hypertension: yes Hyperlipidemia. Yes, on statin.   Current / Home Diabetic regimen includes: Metformin ER 1000 mg 2 times a day. Jardiance 10 mg daily, not taking.  Lantus 10 units at bedtime. Humalog 8 units with meals 3 times a day.  Prior diabetic medications: NPH and regular insulin  Glycemic data:   Not able to download the glucometer in the clinic today.  Reviewed blood sugar from the meter directly 7-day average glucose 354, ordered blood sugar 317, 481, 430, 295, 249, 317.  Hypoglycemia: Patient has no hypoglycemic episodes. Patient has hypoglycemia awareness.  Factors modifying glucose control: 1.  Diabetic diet assessment: 3 meals a day.  2.  Staying active or exercising: No formal exercise.  3.  Medication compliance: compliant most of the time.  Interval history 10/18/22 Patient does not want to use insulin pen and lancet device.  He has  been taking Lantus 10 units at night daily and Humalog 8 units with meals 3 times a day.  He has not been taking Lantus 2 times a day as suggested in the previous office visit.  He prefers to use insulin syringe. Discussed that insulin pen would be safer and easier to do.  He wants to do the way he used to do in the past and wants to stay on insulin syringes.  Patient is accompanied by daughter in the clinic today.  REVIEW OF SYSTEMS As per history of present illness.   PAST MEDICAL HISTORY: Past Medical History:  Diagnosis Date   Benign prostatic hyperplasia (BPH) with urinary urge incontinence 11/13/2017   Chronic left shoulder pain 11/13/2017   Chronic non-seasonal allergic rhinitis 02/17/2006   Chronic systolic heart failure (HCC) 10/31/2016   Echo (05/03/2015): LVEF 45-50%, grade 2 diastolic dysfunction   Coronary artery disease involving native coronary artery of native heart without angina pectoris 09/30/2007   s/p CABG on 01/05/2013: left internal mammary artery to left anterior descending, saphenous vein graft to obtuse marginal 1, sequential saphenous vein graft to acute marginal and posterior descending   Essential hypertension 11/04/2005   Gastroesophageal reflux disease with hiatal hernia 11/04/2005   History of prostate cancer 02/17/2006   Diagnosed 1995, s/p seed implantation in the late 1990's, PSA  undetectable 04/10/2016   Hyperlipidemia 11/04/2005   Obesity (BMI 30.0-34.9) 11/13/2017   Peripheral arterial disease (HCC) 09/30/2007   with claudication, failed attempt at LLE PTCA   Type 2 diabetes mellitus with microalbuminuria, with long-term current use of insulin (HCC) 11/13/2017   Type 2 diabetes mellitus with mild 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, bilateral (HCC) 03/24/2018   Type 2 diabetes mellitus with peripheral neuropathy (HCC) 11/04/2005   Urethral stricture in male 11/13/2017   Noted on cystoscopy 12/18/2014.  Reportedly asked to do chronic  intermittent self catheterizations, but has not.    PAST SURGICAL HISTORY: Past Surgical History:  Procedure Laterality Date   BREAST SURGERY     CARDIAC CATHETERIZATION  10/05/2008   REVEALS HYPOKINESIS OF THE LATERAL WALL AND EF 50-55%   CORONARY ANGIOPLASTY WITH STENT PLACEMENT     CORONARY ARTERY BYPASS GRAFT N/A 01/05/2013   Procedure: CORONARY ARTERY BYPASS GRAFTING (CABG);  Surgeon: Loreli Slot, MD;  Location: [facility] OR;  Service: Open Heart Surgery;  Laterality: N/A;   INTRAOPERATIVE TRANSESOPHAGEAL ECHOCARDIOGRAM N/A 01/05/2013   Procedure: INTRAOPERATIVE TRANSESOPHAGEAL ECHOCARDIOGRAM;  Surgeon: Loreli Slot, MD;  Location: [facility] OR;  Service: Open Heart Surgery;  Laterality: N/A;   LEFT HEART CATHETERIZATION WITH CORONARY ANGIOGRAM N/A 01/03/2013   Procedure: LEFT HEART CATHETERIZATION WITH CORONARY ANGIOGRAM;  Surgeon: [name], MD;  Location: [facility] CATH LAB;  Service: Cardiovascular;  Laterality: N/A;   LOWER EXTREMITY ANGIOGRAM Right 12/07/2012   unsuccessful attempt at percutaneous revascularization of a calcified long segment chronic total occlusion mid left SFA /notes 12/07/2012   LOWER EXTREMITY ANGIOGRAM N/A 12/07/2012   Procedure: LOWER EXTREMITY ANGIOGRAM;  Surgeon: [name], MD;  Location: [facility] CATH LAB;  Service: Cardiovascular;  Laterality: N/A;   PROSTATE SURGERY  10/04/1988    ALLERGIES: No Known Allergies  FAMILY HISTORY:  Family History  Problem Relation Age of Onset   Kidney failure Mother    Hypertension Father    Stroke Father    Other Sister    Other Brother    Other Brother    Other Brother    Other Brother    Other Brother    Other Brother    Other Brother    Other Brother    Healthy Daughter    Healthy Daughter    Healthy Daughter    Healthy Son     SOCIAL HISTORY: Social History   Socioeconomic History   Marital status: Married    Spouse name: Not on file   Number of children: 4   Years of education: 12    Highest education level: 12th grade  Occupational History   Occupation: [employer] doctor    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  Tobacco Use   Smoking status: Former    Current packs/day: 0.00    Average packs/day: 0.8 packs/day for 20.0 years (15.0 ttl pk-yrs)    Types: Cigarettes    Start date: 07/08/1960    Quit date: 07/08/1980    Years since quitting: 42.3   Smokeless tobacco: Never  Vaping Use   Vaping status: Never Used  Substance and Sexual Activity   Alcohol use: No    Alcohol/week: 0.0 standard drinks of alcohol    Comment: 12/07/2012 "quit drinking alcohol > 25 yr ago"   Drug use: No   Sexual activity: Not Currently  Other Topics Concern   Not on file  Social History Narrative   Occupation: Optician, dispensing   Married   Social Determinants  of Health   Financial Resource Strain: Low Risk  (07/26/2019)   Overall Financial Resource Strain (CARDIA)    Difficulty of Paying Living Expenses: Not very hard  Food Insecurity: No Food Insecurity (10/09/2021)   Hunger Vital Sign    Worried About Running Out of Food in the Last Year: Never true    Ran Out of Food in the Last Year: Never true  Transportation Needs: No Transportation Needs (10/09/2021)   PRAPARE - Administrator, Civil Service (Medical): No    Lack of Transportation (Non-Medical): No  Physical Activity: Inactive (10/09/2021)   Exercise Vital Sign    Days of Exercise per Week: 0 days    Minutes of Exercise per Session: 0 min  Stress: No Stress Concern Present (10/09/2021)   [employer] of Occupational Health - Occupational Stress Questionnaire    Feeling of Stress : Not at all  Social Connections: Socially Integrated (10/09/2021)   Social Connection and Isolation Panel [NHANES]    Frequency of Communication with Friends and Family: Twice a week    Frequency of Social Gatherings with Friends and Family: Twice a week    Attends Religious Services: More than 4 times per year    Active Member of [employer] or  Organizations: Yes    Attends Engineer, structural: More than 4 times per year    Marital Status: Married    MEDICATIONS:  Current Outpatient Medications  Medication Sig Dispense Refill   allopurinol (ZYLOPRIM) 100 MG tablet Take 2 tablets by mouth once daily 180 tablet 0   amLODipine (NORVASC) 2.5 MG tablet Take 2.5 mg by mouth daily.     aspirin EC 81 MG tablet Take 1 tablet (81 mg total) by mouth daily.     atorvastatin (LIPITOR) 40 MG tablet Take 1 tablet (40 mg total) by mouth daily. 90 tablet 3   benzonatate (TESSALON) 100 MG capsule Take 1 capsule (100 mg total) by mouth every 8 (eight) hours. 21 capsule 0   blood glucose meter kit and supplies KIT Dispense based on patient and insurance preference. Use up to four times daily as directed. 1 each 0   cephALEXin (KEFLEX) 500 MG capsule Take 1 capsule (500 mg total) by mouth 4 (four) times daily. 15 capsule 0   cetirizine (ZYRTEC ALLERGY) 10 MG tablet Take 0.5 tablets (5 mg total) by mouth daily. 15 tablet 0   colchicine 0.6 MG tablet Take 1 tablet (0.6 mg total) by mouth 2 (two) times daily as needed (gout flare). 60 tablet 1   furosemide (LASIX) 20 MG tablet Take 1 tablet (20 mg total) by mouth daily. 90 tablet 3   gabapentin (NEURONTIN) 100 MG capsule Take 200 mg by mouth 3 (three) times daily.     losartan (COZAAR) 50 MG tablet Take 1 tablet (50 mg total) by mouth daily. 90 tablet 3   metFORMIN (GLUCOPHAGE) 500 MG tablet Take 2 tablets (1,000 mg total) by mouth 2 (two) times daily with a meal. 120 tablet 5   metoprolol tartrate (LOPRESSOR) 25 MG tablet Take 2 tablets (50 mg total) by mouth 2 (two) times daily. 180 tablet 1   nitroGLYCERIN (NITROSTAT) 0.4 MG SL tablet Place 1 tablet (0.4 mg total) under the tongue every 5 (five) minutes as needed for chest pain. 25 tablet 3   pantoprazole (PROTONIX) 20 MG tablet Take 20 mg by mouth as needed for heartburn.     Push Button Safety Lancets 28G MISC 1 Lancet by Does  not apply  route 3 (three) times daily. 300 each 0   tamsulosin (FLOMAX) 0.4 MG CAPS capsule Take 1 capsule (0.4 mg total) by mouth daily. 90 capsule 3   trospium (SANCTURA) 20 MG tablet Take 20 mg by mouth in the morning and at bedtime.     insulin glargine (LANTUS) 100 UNIT/ML injection Inject 0.15 mLs (15 Units total) into the skin daily. 10 mL 11   insulin lispro (HUMALOG) 100 UNIT/ML injection Inject 0.08 mLs (8 Units total) into the skin 3 (three) times daily before meals. 8-10 units daily before meals 10 mL 11   Insulin Syringe-Needle U-100 (B-D INSULIN SYRINGE) 31G X 5/16" 0.3 ML MISC Use to inject insulin 4 times a day 200 each 5   Current Facility-Administered Medications  Medication Dose Route Frequency Provider Last Rate Last Admin   Insulin Pen Needle (NOVOFINE) 100 each  10 packet Subcutaneous TID Reather Littler, MD        PHYSICAL EXAM: Vitals:   10/15/22 1423  BP: 115/60  Pulse: 71  SpO2: 99%  Weight: 226 lb 9.6 oz (102.8 kg)  Height: 6\' 1"  (1.854 m)   Body mass index is 29.9 kg/m.  Wt Readings from Last 3 Encounters:  10/15/22 226 lb 9.6 oz (102.8 kg)  10/14/22 240 lb (108.9 kg)  10/06/22 240 lb (108.9 kg)    General: Well developed, well nourished male in no apparent distress.  HEENT: AT/[place], no external lesions.  Eyes: Conjunctiva clear and no icterus. Neck: Neck supple  Lungs: Respirations not labored Neurologic: Alert, oriented, normal speech Extremities / Skin: Dry. No sores or rashes noted.  Psychiatric: Does not appear depressed or anxious  Diabetic Foot Exam - Simple   No data filed      LABS Reviewed Lab Results  Component Value Date   HGBA1C 8.2 (H) 09/01/2022   HGBA1C 8.9 (A) 07/02/2022   HGBA1C 7.9 (H) 02/04/2022   Lab Results  Component Value Date   FRUCTOSAMINE 320 (H) 09/01/2022   Lab Results  Component Value Date   CHOL 152 07/02/2022   HDL 35.60 (L) 07/02/2022   LDLCALC 99 07/02/2022   TRIG 85.0 07/02/2022   CHOLHDL 4 07/02/2022   Lab  Results  Component Value Date   MICRALBCREAT 5.4 02/04/2022   MICRALBCREAT 124 (H) 09/14/2020   Lab Results  Component Value Date   CREATININE 1.25 (H) 10/14/2022   Lab Results  Component Value Date   GFR 53.84 (L) 09/01/2022    ASSESSMENT / PLAN  1. Type 2 diabetes mellitus with diabetic polyneuropathy, with long-term current use of insulin (HCC)   2. Uncontrolled type 2 diabetes mellitus with hyperglycemia, with long-term current use of insulin (HCC)   3. Long term (current) use of oral hypoglycemic drugs     Diabetes Mellitus type 2, complicated by diabetic neuropathy - Diabetic status / severity: uncontrolled.   Lab Results  Component Value Date   HGBA1C 8.2 (H) 09/01/2022    - Hemoglobin A1c goal : <8%  Patient is mostly having hyperglycemia.  Patient prefers to use insulin syringes and he wants to use the lancet that is being used in the clinic.  Prescription sent.  - Medications: See below  I) increase Lantus from 10 to 15 units daily. II) continue Humalog 8 units with meals 3 times a day. III) continue metformin 1000 mg 2 times a day.  - Home glucose testing: Before meals and at bedtime. - Discussed/ Gave Hypoglycemia treatment plan.  # Consult :  not required at this time.   # Annual urine for microalbuminuria/ creatinine ratio, no microalbuminuria currently, continue ACE/ARB /losartan. Last  Lab Results  Component Value Date   MICRALBCREAT 5.4 02/04/2022    # Foot check nightly / neuropathy, continue gabapentin, managed by primary care provider.  # Annual dilated diabetic eye exams.  Follow-up with ophthalmology.  2. Blood pressure  -  BP Readings from Last 1 Encounters:  10/15/22 115/60    - Control is in target.  - No change in current plans.  3. Lipid status / Hyperlipidemia - Last  Lab Results  Component Value Date   LDLCALC 99 07/02/2022   - Continue atorvastatin 40 mg daily.  Avyukth was seen today for diabetes.  Diagnoses and  all orders for this visit:  Type 2 diabetes mellitus with diabetic polyneuropathy, with long-term current use of insulin (HCC) -     POCT Glucose (CBG) -     Push Button Safety Lancets 28G MISC; 1 Lancet by Does not apply route 3 (three) times daily.  Uncontrolled type 2 diabetes mellitus with hyperglycemia, with long-term current use of insulin (HCC) -     insulin glargine (LANTUS) 100 UNIT/ML injection; Inject 0.15 mLs (15 Units total) into the skin daily. -     insulin lispro (HUMALOG) 100 UNIT/ML injection; Inject 0.08 mLs (8 Units total) into the skin 3 (three) times daily before meals. 8-10 units daily before meals  Long term (current) use of oral hypoglycemic drugs  Other orders -     Insulin Syringe-Needle U-100 (B-D INSULIN SYRINGE) 31G X 5/16" 0.3 ML MISC; Use to inject insulin 4 times a day    DISPOSITION Follow up in clinic in 1 month suggested.   All questions answered and patient verbalized understanding of the plan.  [name] Laramie Meissner, MD [facility] End[facility] [address] [place], [address] [place], [place] [address] Phone # [phone]  At least part of this note was generated using voice recognition software. Inadvertent word errors may have occurred, which were not recognized during the proofreading process.

## 2022-10-15 NOTE — Telephone Encounter (Signed)
I spoke to [place] at [facility] and clarified the dose of Humalog

## 2022-10-15 NOTE — Patient Instructions (Signed)
Diabetes regimen: Increase Lantus to 15 units daily. Humalog 8 units with meals three times a day. Metformin 1000 mg two times a day.

## 2022-10-15 NOTE — Telephone Encounter (Signed)
[facility] calling to confirm a RX that patient has there, Please call Christy at [phone]

## 2022-10-18 ENCOUNTER — Encounter: Payer: Self-pay | Admitting: Endocrinology

## 2022-10-31 ENCOUNTER — Other Ambulatory Visit: Payer: Self-pay | Admitting: Family Medicine

## 2022-10-31 DIAGNOSIS — R17 Unspecified jaundice: Secondary | ICD-10-CM

## 2022-11-14 ENCOUNTER — Ambulatory Visit
Admission: RE | Admit: 2022-11-14 | Discharge: 2022-11-14 | Disposition: A | Discharge status: Home / Self Care | Payer: Medicare Other | Source: Ambulatory Visit | Attending: Family Medicine | Admitting: Family Medicine

## 2022-11-14 DIAGNOSIS — R17 Unspecified jaundice: Secondary | ICD-10-CM

## 2022-11-24 ENCOUNTER — Other Ambulatory Visit: Payer: Self-pay | Admitting: Student

## 2022-11-24 DIAGNOSIS — I5042 Chronic combined systolic (congestive) and diastolic (congestive) heart failure: Secondary | ICD-10-CM

## 2022-11-27 ENCOUNTER — Other Ambulatory Visit: Payer: Medicare Other

## 2022-11-27 ENCOUNTER — Telehealth: Payer: Self-pay | Admitting: Cardiovascular Disease

## 2022-11-27 DIAGNOSIS — I5042 Chronic combined systolic (congestive) and diastolic (congestive) heart failure: Secondary | ICD-10-CM

## 2022-11-27 MED ORDER — FUROSEMIDE 20 MG PO TABS: 20.0000 mg | ORAL_TABLET | Freq: Every day | ORAL | 0 refills | Status: DC

## 2022-11-27 NOTE — Telephone Encounter (Signed)
Medication has been refilled and sent to patient's preferred pharmacy

## 2022-11-27 NOTE — Telephone Encounter (Signed)
*  STAT* If patient is at the pharmacy, call can be transferred to refill team.   1. Which medications need to be refilled? (please list name of each medication and dose if known)  furosemide (LASIX) 20 MG tablet  2. Which pharmacy/location (including street and city if local pharmacy) is medication to be sent to? [facility] - [place], [place] - [address]   3. Do they need a 30 day or 90 day supply? 90 day supply  Patient's daughter states the patient is completely out of medication.

## 2022-11-28 ENCOUNTER — Other Ambulatory Visit (INDEPENDENT_AMBULATORY_CARE_PROVIDER_SITE_OTHER): Payer: Medicare Other

## 2022-11-28 DIAGNOSIS — E1165 Type 2 diabetes mellitus with hyperglycemia: Secondary | ICD-10-CM | POA: Diagnosis not present

## 2022-11-28 DIAGNOSIS — Z794 Long term (current) use of insulin: Secondary | ICD-10-CM | POA: Diagnosis not present

## 2022-11-28 DIAGNOSIS — E113293 Type 2 diabetes mellitus with mild 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, bilateral: Secondary | ICD-10-CM

## 2022-11-28 LAB — BASIC METABOLIC PANEL
BUN: 26 mg/dL — ABNORMAL HIGH (ref 6–23)
CO2: 25 meq/L (ref 19–32)
Calcium: 9.3 mg/dL (ref 8.4–10.5)
Chloride: 109 meq/L (ref 96–112)
Creatinine, Ser: 1.09 mg/dL (ref 0.40–1.50)
GFR: 60.32 mL/min (ref >60.00)
Glucose, Bld: 110 mg/dL — ABNORMAL HIGH (ref 70–99)
Potassium: 4.2 meq/L (ref 3.5–5.1)
Sodium: 142 meq/L (ref 135–145)

## 2022-12-01 ENCOUNTER — Other Ambulatory Visit: Payer: Medicare Other

## 2022-12-01 LAB — FRUCTOSAMINE: Fructosamine: 312 umol/L — ABNORMAL HIGH (ref 205–285)

## 2022-12-04 ENCOUNTER — Ambulatory Visit: Payer: Medicare Other | Admitting: Endocrinology

## 2022-12-08 NOTE — Progress Notes (Unsigned)
Office Visit    Patient Name: [name] [name] Date of Encounter: 12/09/2022  PCP:  Ellyn Hack, MD   [facility]  Cardiologist:  Kristeen Miss, MD  Advanced Practice Provider:  No care team member to display Electrophysiologist:  None   HPI    [name] [name] is a 89 y.o. male with CAD s/p CABG 01/2013, type II diabetes mellitus, BPH, chronic systolic heart failure, hypertension, PAD, and hyperlipidemia presents today for  follow-up appointment.   He was seen 03/22/2021 and was having some mid  sternal chest pain. It was occurring after he ate food. It would resolve in about 30 minutes. He did not need a nitro. He took some Prilosec and it eventually resolved. He is chronically SOB, he had some indigestion like pains at times but not exertional. Labs were looking good.   I saw him 09/23/2021,, he shares that he was in the ED over the weekend with a UTI.  It was noted that he had encephalopathy.  He was on antibiotics.  Today he has not had any chest pains or shortness of breath.  He has not had any palpitations or fast heart rates.  His blood pressure is well controlled at 138/60.  He is still having some claudication with ambulation and we have set up an appointment with Dr. Gery Pray who he has seen in the past.  He occasionally has a little bit of swelling in his left leg but today he appears euvolemic.  His weight has been pretty stable and in fact he has lost a few pounds.   Dr. Elease Hashimoto saw him 05/15/2022 and at that time he was having some bad headaches.  On amlodipine which may have been contributing to the headache.  Suggesting Tylenol.  Overall seemed okay.  I saw him 8/24, he requested an appointment with me regarding left-sided chest pain.  He was laying on the bed writing and he laid there a good while, had some pains.  He does have a history of chronic left upper shoulder pain.  He states his shoulder was hurting when he kept writing.  Then had some  following upper left-sided chest discomfort (he pointed more towards the inside of his shoulder joint).  Felt like he was having some acid reflux maybe a pulled muscle as well.  The upper left pain finally went away.  When he moved his arm a certain way the pain to go away which makes me think this was more musculoskeletal.  We discussed the difference between musculoskeletal pain, cardiac chest pain, and GERD related pain.  We refilled his nitroglycerin tabs and asked him to only use these if he felt the pain was cardiac related.  He is elderly and lives by himself so we have sent a referral to Renaee Munda for assistance.  Unfortunately, he presented to the ER 10/14/2022 with weakness and altered mental status.  Found to have a UTI.  He was treated with antibiotics.  Today, he presents with a history of hypertension and diabetes,  with dizziness and confusion. He was recently treated in the ER with antibiotics for a urinary tract infection, which has since resolved. He reports taking a large number of medications throughout the day, which he believes may be contributing to his dizziness, particularly if he does not eat enough. He has not noticed any swelling in his feet or ankles.  The patient's blood pressure was noted to be low, particularly the diastolic reading. He has not  experienced any chest pain or shortness of breath, and he feels able to do his usual activities. He expresses a desire to be more active and walk more.  Reports no shortness of breath nor dyspnea on exertion. Reports no chest pain, pressure, or tightness. No edema, orthopnea, PND. Reports no palpitations.    Past Medical History    Past Medical History:  Diagnosis Date   Benign prostatic hyperplasia (BPH) with urinary urge incontinence 11/13/2017   Chronic left shoulder pain 11/13/2017   Chronic non-seasonal allergic rhinitis 02/17/2006   Chronic systolic heart failure (HCC) 10/31/2016   Echo (05/03/2015): LVEF 45-50%, grade 2  diastolic dysfunction   Coronary artery disease involving native coronary artery of native heart without angina pectoris 09/30/2007   s/p CABG on 01/05/2013: left internal mammary artery to left anterior descending, saphenous vein graft to obtuse marginal 1, sequential saphenous vein graft to acute marginal and posterior descending   Essential hypertension 11/04/2005   Gastroesophageal reflux disease with hiatal hernia 11/04/2005   History of prostate cancer 02/17/2006   Diagnosed 1995, s/p seed implantation in the late 1990's, PSA undetectable 04/10/2016   Hyperlipidemia 11/04/2005   Obesity (BMI 30.0-34.9) 11/13/2017   Peripheral arterial disease (HCC) 09/30/2007   with claudication, failed attempt at LLE PTCA   Type 2 diabetes mellitus with microalbuminuria, with long-term current use of insulin (HCC) 11/13/2017   Type 2 diabetes mellitus with mild 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, bilateral (HCC) 03/24/2018   Type 2 diabetes mellitus with peripheral neuropathy (HCC) 11/04/2005   Urethral stricture in male 11/13/2017   Noted on cystoscopy 12/18/2014.  Reportedly asked to do chronic intermittent self catheterizations, but has not.   Past Surgical History:  Procedure Laterality Date   BREAST SURGERY     CARDIAC CATHETERIZATION  10/05/2008   REVEALS HYPOKINESIS OF THE LATERAL WALL AND EF 50-55%   CORONARY ANGIOPLASTY WITH STENT PLACEMENT     CORONARY ARTERY BYPASS GRAFT N/A 01/05/2013   Procedure: CORONARY ARTERY BYPASS GRAFTING (CABG);  Surgeon: Loreli Slot, MD;  Location: [facility] OR;  Service: Open Heart Surgery;  Laterality: N/A;   INTRAOPERATIVE TRANSESOPHAGEAL ECHOCARDIOGRAM N/A 01/05/2013   Procedure: INTRAOPERATIVE TRANSESOPHAGEAL ECHOCARDIOGRAM;  Surgeon: Loreli Slot, MD;  Location: [facility] OR;  Service: Open Heart Surgery;  Laterality: N/A;   LEFT HEART CATHETERIZATION WITH CORONARY ANGIOGRAM N/A 01/03/2013   Procedure: LEFT HEART CATHETERIZATION WITH  CORONARY ANGIOGRAM;  Surgeon: [name], MD;  Location: [facility] CATH LAB;  Service: Cardiovascular;  Laterality: N/A;   LOWER EXTREMITY ANGIOGRAM Right 12/07/2012   unsuccessful attempt at percutaneous revascularization of a calcified long segment chronic total occlusion mid left SFA /notes 12/07/2012   LOWER EXTREMITY ANGIOGRAM N/A 12/07/2012   Procedure: LOWER EXTREMITY ANGIOGRAM;  Surgeon: [name], MD;  Location: [facility] CATH LAB;  Service: Cardiovascular;  Laterality: N/A;   PROSTATE SURGERY  10/04/1988    Allergies  No Known Allergies  EKGs/Labs/Other Studies Reviewed:   The following studies were reviewed today:  ABI 12/08/2018 Summary:  Right: Resting right ankle-brachial index indicates moderate right lower  extremity arterial disease. The right toe-brachial index is abnormal.   Left: Resting left ankle-brachial index indicates moderate left lower  extremity arterial disease. The left toe-brachial index is abnormal.   EKG:  EKG is  ordered today.  The ekg ordered today demonstrates normal sinus rhythm with PACs  Recent Labs: 10/14/2022: ALT 21; Hemoglobin 13.6; Platelets 259 11/28/2022: BUN 26; Creatinine, Ser 1.09; Potassium 4.2; Sodium 142  Recent Lipid Panel    Component Value Date/Time   CHOL 152 07/02/2022 0941   CHOL 143 09/18/2021 1049   TRIG 85.0 07/02/2022 0941   HDL 35.60 (L) 07/02/2022 0941   HDL 34 (L) 09/18/2021 1049   CHOLHDL 4 07/02/2022 0941   VLDL 17.0 07/02/2022 0941   LDLCALC 99 07/02/2022 0941   LDLCALC 91 09/18/2021 1049    Home Medications   Current Meds  Medication Sig   allopurinol (ZYLOPRIM) 100 MG tablet Take 2 tablets by mouth once daily   amLODipine (NORVASC) 2.5 MG tablet Take 2.5 mg by mouth daily.   aspirin EC 81 MG tablet Take 81 mg by mouth as needed. Swallow whole.   atorvastatin (LIPITOR) 40 MG tablet Take 1 tablet (40 mg total) by mouth daily.   benzonatate (TESSALON) 100 MG capsule Take 1 capsule (100 mg total) by  mouth every 8 (eight) hours.   blood glucose meter kit and supplies KIT Dispense based on patient and insurance preference. Use up to four times daily as directed.   cetirizine (ZYRTEC ALLERGY) 10 MG tablet Take 0.5 tablets (5 mg total) by mouth daily. (Patient taking differently: Take 5 mg by mouth as needed for allergies.)   colchicine 0.6 MG tablet Take 1 tablet (0.6 mg total) by mouth 2 (two) times daily as needed (gout flare).   furosemide (LASIX) 20 MG tablet Take 1 tablet (20 mg total) by mouth daily.   gabapentin (NEURONTIN) 100 MG capsule Take 100 mg by mouth 2 (two) times daily. Pt takes 2 tablet 200 mg bid   insulin glargine (LANTUS) 100 UNIT/ML injection Inject 0.15 mLs (15 Units total) into the skin daily.   insulin lispro (HUMALOG) 100 UNIT/ML injection Inject 0.08 mLs (8 Units total) into the skin 3 (three) times daily before meals. 8-10 units daily before meals   losartan (COZAAR) 50 MG tablet Take 1 tablet (50 mg total) by mouth daily.   metFORMIN (GLUCOPHAGE) 500 MG tablet Take 2 tablets (1,000 mg total) by mouth 2 (two) times daily with a meal.   metoprolol tartrate (LOPRESSOR) 25 MG tablet Take 2 tablets (50 mg total) by mouth 2 (two) times daily.   nitroGLYCERIN (NITROSTAT) 0.4 MG SL tablet Place 1 tablet (0.4 mg total) under the tongue every 5 (five) minutes as needed for chest pain.   pantoprazole (PROTONIX) 20 MG tablet Take 20 mg by mouth as needed for heartburn.   Push Button Safety Lancets 28G MISC 1 Lancet by Does not apply route 3 (three) times daily.   tamsulosin (FLOMAX) 0.4 MG CAPS capsule Take 1 capsule (0.4 mg total) by mouth daily.   trospium (SANCTURA) 20 MG tablet Take 20 mg by mouth as needed.   Current Facility-Administered Medications for the 12/09/22 encounter (Office Visit) with Sharlene Dory, PA-C  Medication   Insulin Pen Needle (NOVOFINE) 100 each     Review of Systems      All other systems reviewed and are otherwise negative except as noted  above.  Physical Exam    VS:  BP (!) 120/52   Pulse 61   Ht 6\' 1"  (1.854 m)   Wt 231 lb 9.6 oz (105.1 kg)   SpO2 98%   BMI 30.56 kg/m  , BMI Body mass index is 30.56 kg/m.  Wt Readings from Last 3 Encounters:  12/09/22 231 lb 9.6 oz (105.1 kg)  10/15/22 226 lb 9.6 oz (102.8 kg)  10/14/22 240 lb (108.9 kg)     GEN:  Well nourished, well developed, unkempt in appearance, no acute distress HEENT: normal. Neck: Supple, no JVD, carotid bruits, or masses. Cardiac: IRIR, no murmurs, rubs, or gallops. No clubbing, cyanosis, edema.  Radials/PT 2+ and equal bilaterally.  Respiratory:  Respirations regular and unlabored, clear to auscultation bilaterally. GI: Soft, nontender, nondistended. MS: No deformity or atrophy. Skin: Warm and dry, no rash. Neuro:  Strength and sensation are intact. Psych: Normal affect.  Assessment & Plan   DM -He needs better control.  Last A1c was 8.2 -Defer treatment to primary care   HLD -LDL 99 which is above goal -Goal LDL less than 70 due to CAD -lipid panel and LFTs today, may need to increase Lipitor if remains above goal  CHF -Euvolemic on exam -Continue current medication regimen     Urinary Tract Infection Recently treated with antibiotics. No current symptoms of dysuria or urinary frequency. - No changes to current management.  Dizziness Possible medication-related dizziness. Patient reports taking multiple medications at once. Noted low diastolic blood pressure. - Advise taking medications with food to prevent dizziness. - Reduce Lasix (furosemide) to as needed basis. Use if experiencing difficulty breathing, swelling in feet or ankles, or weight gain of 2-3 pounds in one day or 5 pounds in a week. - Reduce losartan from 50mg  to 25mg  daily. New prescription to be sent to pharmacy. - Monitor blood pressure at home for two weeks, checking one hour after morning and evening medications.  Medication Management Patient currently taking  multiple medications at different times throughout the day. Some confusion about dosing. - Change metoprolol to long-acting version, 50mg  once daily in the morning. New prescription to be sent to pharmacy. - Provide updated medication list with clear instructions.  Irregular Heart Rate Noted during physical examination. Recent EKG in ER was normal. - Perform EKG in office today to r/o Afib   Disposition: Follow up 6 months with Kristeen Miss, MD or APP.  Signed, Sharlene Dory, PA-C 12/09/2022, 10:56 AM [facility]

## 2022-12-09 ENCOUNTER — Encounter: Payer: Self-pay | Admitting: Physician Assistant

## 2022-12-09 ENCOUNTER — Ambulatory Visit (INDEPENDENT_AMBULATORY_CARE_PROVIDER_SITE_OTHER): Payer: Medicare Other | Admitting: Physician Assistant

## 2022-12-09 ENCOUNTER — Ambulatory Visit: Payer: Medicare Other | Attending: Physician Assistant

## 2022-12-09 ENCOUNTER — Other Ambulatory Visit (HOSPITAL_BASED_OUTPATIENT_CLINIC_OR_DEPARTMENT_OTHER): Payer: Self-pay | Admitting: Physician Assistant

## 2022-12-09 VITALS — BP 120/52 | HR 61 | Ht 73.0 in | Wt 231.6 lb

## 2022-12-09 DIAGNOSIS — I10 Essential (primary) hypertension: Secondary | ICD-10-CM

## 2022-12-09 DIAGNOSIS — I2510 Atherosclerotic heart disease of native coronary artery without angina pectoris: Secondary | ICD-10-CM

## 2022-12-09 DIAGNOSIS — E1142 Type 2 diabetes mellitus with diabetic polyneuropathy: Secondary | ICD-10-CM

## 2022-12-09 DIAGNOSIS — E785 Hyperlipidemia, unspecified: Secondary | ICD-10-CM

## 2022-12-09 DIAGNOSIS — I5020 Unspecified systolic (congestive) heart failure: Secondary | ICD-10-CM | POA: Diagnosis not present

## 2022-12-09 DIAGNOSIS — I5042 Chronic combined systolic (congestive) and diastolic (congestive) heart failure: Secondary | ICD-10-CM

## 2022-12-09 MED ORDER — AMLODIPINE BESYLATE 2.5 MG PO TABS: 2.5000 mg | ORAL_TABLET | Freq: Every day | ORAL | 3 refills | Status: DC

## 2022-12-09 MED ORDER — FUROSEMIDE 20 MG PO TABS: 20.0000 mg | ORAL_TABLET | ORAL | 1 refills | Status: DC | PRN

## 2022-12-09 MED ORDER — METOPROLOL SUCCINATE ER 50 MG PO TB24: 50.0000 mg | ORAL_TABLET | Freq: Every day | ORAL | 3 refills | Status: DC

## 2022-12-09 MED ORDER — LOSARTAN POTASSIUM 25 MG PO TABS: 25.0000 mg | ORAL_TABLET | Freq: Every day | ORAL | 3 refills | Status: AC

## 2022-12-09 NOTE — Patient Instructions (Addendum)
Medication Instructions:   CHANGE LOPRESSOR TO Toprol XL one (1) tablet by  mouth ( 50 mg ) daily.  CHANGE Lasix one (1) tablet by mouth ( 20 mg ) as needed for SOB or wt gain of 2 lbs in 24 hour or 5 lbs in one week.   DECREASE Losartan one (1) tablet by mouth (25 mg) daily.   *If you need a refill on your cardiac medications before your next appointment, please call your pharmacy*   Lab Work:  Patient already had lab work prior to appointment.   If you have labs (blood work) drawn today and your tests are completely normal, you will receive your results only by: MyChart Message (if you have MyChart) OR A paper copy in the mail If you have any lab test that is abnormal or we need to change your treatment, we will call you to review the results.   Testing/Procedures:  None ordered.   Follow-Up: At [facility], you and your health needs are our priority.  As part of our continuing mission to provide you with exceptional heart care, we have created designated Provider Care Teams.  These Care Teams include your primary Cardiologist (physician) and Advanced Practice Providers (APPs -  Physician Assistants and Nurse Practitioners) who all work together to provide you with the care you need, when you need it.  We recommend signing up for the patient portal called "MyChart".  Sign up information is provided on this After Visit Summary.  MyChart is used to connect with patients for Virtual Visits (Telemedicine).  Patients are able to view lab/test results, encounter notes, upcoming appointments, etc.  Non-urgent messages can be sent to your provider as well.   To learn more about what you can do with MyChart, go to [email or web address].    Your next appointment:   6 month(s)  Provider:   Kristeen Miss, MD     Other Instructions  Your physician wants you to follow-up in: 6 months.  You will receive a reminder letter in the mail two months in advance. If you don't receive a  letter, please call our office to schedule the follow-up appointment.  HOW TO TAKE YOUR BLOOD PRESSURE  Rest 5 minutes before taking your blood pressure. Don't  smoke or drink caffeinated beverages for at least 30 minutes before. Take your blood pressure before (not after) you eat. Sit comfortably with your back supported and both feet on the floor ( don't cross your legs). Elevate your arm to heart level on a table or a desk. Use the proper sized cuff.  It should fit smoothly and snugly around your bare upper arm.  There should be  Enough room to slip a fingertip under the cuff.  The bottom edge of the cuff should be 1 inch above the crease Of the elbow. Please monitor your blood pressure once daily 2 hours after your am medication. If you blood pressure Consistently remains above 130 (systolic) top number or over 80 ( diastolic) bottom number X 3 days  Consecutively.  Please call our office at [phone] or send Mychart message.     ----Avoid cold medicines with D or DM at the end of them----  DASH Eating Plan DASH stands for Dietary Approaches to Stop Hypertension. The DASH eating plan is a healthy eating plan that has been shown to: Lower high blood pressure (hypertension). Reduce your risk for type 2 diabetes, heart disease, and stroke. Help with weight loss. What are tips for following this  plan? Reading food labels Check food labels for the amount of salt (sodium) per serving. Choose foods with less than 5 percent of the Daily Value (DV) of sodium. In general, foods with less than 300 milligrams (mg) of sodium per serving fit into this eating plan. To find whole grains, look for the word "whole" as the first word in the ingredient list. Shopping Buy products labeled as "low-sodium" or "no salt added." Buy fresh foods. Avoid canned foods and pre-made or frozen meals. Cooking Try not to add salt when you cook. Use salt-free seasonings or herbs instead of table salt or sea salt.  Check with your health care provider or pharmacist before using salt substitutes. Do not fry foods. Cook foods in healthy ways, such as baking, boiling, grilling, roasting, or broiling. Cook using oils that are good for your heart. These include olive, canola, avocado, soybean, and sunflower oil. Meal planning  Eat a balanced diet. This should include: 4 or more servings of fruits and 4 or more servings of vegetables each day. Try to fill half of your plate with fruits and vegetables. 6-8 servings of whole grains each day. 6 or less servings of lean meat, poultry, or fish each day. 1 oz is 1 serving. A 3 oz (85 g) serving of meat is about the same size as the palm of your hand. One egg is 1 oz (28 g). 2-3 servings of low-fat dairy each day. One serving is 1 cup (237 mL). 1 serving of nuts, seeds, or beans 5 times each week. 2-3 servings of heart-healthy fats. Healthy fats called omega-3 fatty acids are found in foods such as walnuts, flaxseeds, fortified milks, and eggs. These fats are also found in cold-water fish, such as sardines, salmon, and mackerel. Limit how much you eat of: Canned or prepackaged foods. Food that is high in trans fat, such as fried foods. Food that is high in saturated fat, such as fatty meat. Desserts and other sweets, sugary drinks, and other foods with added sugar. Full-fat dairy products. Do not salt foods before eating. Do not eat more than 4 egg yolks a week. Try to eat at least 2 vegetarian meals a week. Eat more home-cooked food and less restaurant, buffet, and fast food. Lifestyle When eating at a restaurant, ask if your food can be made with less salt or no salt. If you drink alcohol: Limit how much you have to: 0-1 drink a day if you are male. 0-2 drinks a day if you are male. Know how much alcohol is in your drink. In the [place] one drink is one 12 oz bottle of beer (355 mL), one 5 oz glass of wine (148 mL), or one 1 oz glass of hard liquor (44  mL). General information Avoid eating more than 2,300 mg of salt a day. If you have hypertension, you may need to reduce your sodium intake to 1,500 mg a day. Work with your provider to stay at a healthy body weight or lose weight. Ask what the best weight range is for you. On most days of the week, get at least 30 minutes of exercise that causes your heart to beat faster. This may include walking, swimming, or biking. Work with your provider or dietitian to adjust your eating plan to meet your specific calorie needs. What foods should I eat? Fruits All fresh, dried, or frozen fruit. Canned fruits that are in their natural juice and do not have sugar added to them. Vegetables Fresh or frozen  vegetables that are raw, steamed, roasted, or grilled. Low-sodium or reduced-sodium tomato and vegetable juice. Low-sodium or reduced-sodium tomato sauce and tomato paste. Low-sodium or reduced-sodium canned vegetables. Grains Whole-grain or whole-wheat bread. Whole-grain or whole-wheat pasta. Brown rice. Orpah Cobb. Bulgur. Whole-grain and low-sodium cereals. Pita bread. Low-fat, low-sodium crackers. Whole-wheat flour tortillas. Meats and other proteins Skinless chicken or [place]. Ground chicken or [place]. Pork with fat trimmed off. Fish and seafood. Egg whites. Dried beans, peas, or lentils. Unsalted nuts, nut butters, and seeds. Unsalted canned beans. Lean cuts of beef with fat trimmed off. Low-sodium, lean precooked or cured meat, such as sausages or meat loaves. Dairy Low-fat (1%) or fat-free (skim) milk. Reduced-fat, low-fat, or fat-free cheeses. Nonfat, low-sodium ricotta or cottage cheese. Low-fat or nonfat yogurt. Low-fat, low-sodium cheese. Fats and oils Soft margarine without trans fats. Vegetable oil. Reduced-fat, low-fat, or light mayonnaise and salad dressings (reduced-sodium). Canola, safflower, olive, avocado, soybean, and sunflower oils. Avocado. Seasonings and condiments Herbs. Spices.  Seasoning mixes without salt. Other foods Unsalted popcorn and pretzels. Fat-free sweets. The items listed above may not be all the foods and drinks you can have. Talk to a dietitian to learn more. What foods should I avoid? Fruits Canned fruit in a light or heavy syrup. Fried fruit. Fruit in cream or butter sauce. Vegetables Creamed or fried vegetables. Vegetables in a cheese sauce. Regular canned vegetables that are not marked as low-sodium or reduced-sodium. Regular canned tomato sauce and paste that are not marked as low-sodium or reduced-sodium. Regular tomato and vegetable juices that are not marked as low-sodium or reduced-sodium. Rosita Fire. Olives. Grains Baked goods made with fat, such as croissants, muffins, or some breads. Dry pasta or rice meal packs. Meats and other proteins Fatty cuts of meat. Ribs. Fried meat. Tomasa Blase. Bologna, salami, and other precooked or cured meats, such as sausages or meat loaves, that are not lean and low in sodium. Fat from the back of a pig (fatback). Bratwurst. Salted nuts and seeds. Canned beans with added salt. Canned or smoked fish. Whole eggs or egg yolks. Chicken or [place] with skin. Dairy Whole or 2% milk, cream, and half-and-half. Whole or full-fat cream cheese. Whole-fat or sweetened yogurt. Full-fat cheese. Nondairy creamers. Whipped toppings. Processed cheese and cheese spreads. Fats and oils Butter. Stick margarine. Lard. Shortening. Ghee. Bacon fat. Tropical oils, such as coconut, palm kernel, or palm oil. Seasonings and condiments Onion salt, garlic salt, seasoned salt, table salt, and sea salt. Worcestershire sauce. Tartar sauce. Barbecue sauce. Teriyaki sauce. Soy sauce, including reduced-sodium soy sauce. Steak sauce. Canned and packaged gravies. Fish sauce. Oyster sauce. Cocktail sauce. Store-bought horseradish. Ketchup. Mustard. Meat flavorings and tenderizers. Bouillon cubes. Hot sauces. Pre-made or packaged marinades. Pre-made or packaged  taco seasonings. Relishes. Regular salad dressings. Other foods Salted popcorn and pretzels. The items listed above may not be all the foods and drinks you should avoid. Talk to a dietitian to learn more. Where to find more information National Heart, Lung, and Blood Institute (NHLBI): BuffaloDryCleaner.gl American Heart Association (AHA): heart.org Academy of Nutrition and Dietetics: eatright.org National Kidney Foundation (NKF): kidney.org This information is not intended to replace advice given to you by your health care provider. Make sure you discuss any questions you have with your health care provider. Document Revised: 02/06/2022 Document Reviewed: 02/06/2022 Elsevier Patient Education  2024 Elsevier Inc.     Adopting a Healthy Lifestyle.   Weight: Know what a healthy weight is for you (roughly BMI <25) and aim to maintain  this. You can calculate your body mass index on your smart phone. Unfortunately, this is not the most accurate measure of healthy weight, but it is the simplest measurement to use. A more accurate measurement involves body scanning which measures lean muscle, fat tissue and bony density. We do not have this equipment at [facility].    Diet: Aim for 7+ servings of fruits and vegetables daily Limit animal fats in diet for cholesterol and heart health - choose grass fed whenever available Avoid highly processed foods (fast food burgers, tacos, fried chicken, pizza, hot dogs, french fries)  Saturated fat comes in the form of butter, lard, coconut oil, margarine, partially hydrogenated oils, and fat in meat. These increase your risk of cardiovascular disease.  Use healthy plant oils, such as olive, canola, soy, corn, sunflower and peanut.  Whole foods such as fruits, vegetables and whole grains have fiber  Men need > 38 grams of fiber per day Women need > 25 grams of fiber per day  Load up on vegetables and fruits - one-half of your plate: Aim for color and variety, and  remember that potatoes dont count. Go for whole grains - one-quarter of your plate: Whole wheat, barley, wheat berries, quinoa, oats, brown rice, and foods made with them. If you want pasta, go with whole wheat pasta. Protein power - one-quarter of your plate: Fish, chicken, beans, and nuts are all healthy, versatile protein sources. Limit red meat. You need carbohydrates for energy! The type of carbohydrate is more important than the amount. Choose carbohydrates such as vegetables, fruits, whole grains, beans, and nuts in the place of white rice, white pasta, potatoes (baked or fried), macaroni and cheese, cakes, cookies, and donuts.  If youre thirsty, drink water. Coffee and tea are good in moderation, but skip sugary drinks and limit milk and dairy products to one or two daily servings. Keep sugar intake at 6 teaspoons or 24 grams or LESS       Exercise: Aim for 150 min of moderate intensity exercise weekly for heart health, and weights twice weekly for bone health Stay active - any steps are better than no steps! Aim for 7-9 hours of sleep daily      Blood Pressure Record Sheet To take your blood pressure, you will need a blood pressure machine. You may be prescribed one, or you can buy a blood pressure machine (blood pressure monitor) at your clinic, drug store, or online. When choosing one, look for these features: An automatic monitor that has an arm cuff. A cuff that wraps snugly, but not too tightly, around your upper arm. You should be able to fit only one finger between your arm and the cuff. A device that stores blood pressure reading results. Do not choose a monitor that measures your blood pressure from your wrist or finger. Follow your health care provider's instructions for how to take your blood pressure. To use this form: Get one reading in the morning (a.m.) before you take any medicines. Get one reading in the evening (p.m.) before supper. Take at least two readings with  each blood pressure check. This makes sure the results are correct. Wait 1-2 minutes between measurements. Write down the results in the spaces on this form. Repeat this once a week, or as told by your health care provider. Make a follow-up appointment with your health care provider to discuss the results. Blood pressure log Date: _______________________ a.m. _____________________(1st reading) _____________________(2nd reading) p.m. _____________________(1st reading) _____________________(2nd reading) Date: _______________________  a.m. _____________________(1st reading) _____________________(2nd reading) p.m. _____________________(1st reading) _____________________(2nd reading) Date: _______________________ a.m. _____________________(1st reading) _____________________(2nd reading) p.m. _____________________(1st reading) _____________________(2nd reading) Date: _______________________ a.m. _____________________(1st reading) _____________________(2nd reading) p.m. _____________________(1st reading) _____________________(2nd reading) Date: _______________________ a.m. _____________________(1st reading) _____________________(2nd reading) p.m. _____________________(1st reading) _____________________(2nd reading) This information is not intended to replace advice given to you by your health care provider. Make sure you discuss any questions you have with your health care provider. Document Revised: 10/04/2020 Document Reviewed: 10/04/2020 Elsevier Patient Education  2024 [employer].

## 2022-12-10 LAB — LIPID PANEL
Chol/HDL Ratio: 2.4 ratio (ref 0.0–5.0)
Cholesterol, Total: 93 mg/dL — ABNORMAL LOW (ref 100–199)
HDL: 39 mg/dL — ABNORMAL LOW (ref >39)
LDL Chol Calc (NIH): 41 mg/dL (ref 0–99)
Triglycerides: 55 mg/dL (ref 0–149)
VLDL Cholesterol Cal: 13 mg/dL (ref 5–40)

## 2022-12-10 LAB — HEPATIC FUNCTION PANEL
ALT: 9 [IU]/L (ref 0–44)
AST: 15 [IU]/L (ref 0–40)
Albumin: 3.6 g/dL — ABNORMAL LOW (ref 3.7–4.7)
Alkaline Phosphatase: 128 [IU]/L — ABNORMAL HIGH (ref 44–121)
Bilirubin Total: 0.3 mg/dL (ref 0.0–1.2)
Bilirubin, Direct: 0.11 mg/dL (ref 0.00–0.40)
Total Protein: 6.6 g/dL (ref 6.0–8.5)

## 2022-12-11 ENCOUNTER — Encounter: Payer: Self-pay | Admitting: Endocrinology

## 2022-12-11 ENCOUNTER — Ambulatory Visit: Payer: Medicare Other | Admitting: Endocrinology

## 2022-12-11 VITALS — BP 138/70 | HR 78 | Resp 20 | Ht 73.0 in | Wt 232.4 lb

## 2022-12-11 DIAGNOSIS — E1165 Type 2 diabetes mellitus with hyperglycemia: Secondary | ICD-10-CM | POA: Diagnosis not present

## 2022-12-11 DIAGNOSIS — Z794 Long term (current) use of insulin: Secondary | ICD-10-CM | POA: Diagnosis not present

## 2022-12-11 DIAGNOSIS — E1142 Type 2 diabetes mellitus with diabetic polyneuropathy: Secondary | ICD-10-CM | POA: Diagnosis not present

## 2022-12-11 LAB — POCT GLYCOSYLATED HEMOGLOBIN (HGB A1C): Hemoglobin A1C: 7.4 % — AB (ref 4.0–5.6)

## 2022-12-11 MED ORDER — INSULIN LISPRO 100 UNIT/ML IJ SOLN: 5.0000 [IU] | Freq: Three times a day (TID) | INTRAMUSCULAR | 11 refills | Status: DC

## 2022-12-11 MED ORDER — INSULIN GLARGINE 100 UNIT/ML ~~LOC~~ SOLN: 12.0000 [IU] | Freq: Every day | SUBCUTANEOUS | 11 refills | Status: DC

## 2022-12-11 NOTE — Patient Instructions (Addendum)
Diabetes regimen: Decrease Lantus to 12 units daily.  Humalog 5 units with breakfast at 8 or 9 AM, 5 units with lunch at 1 or 2 PM and 5 units with supper at 5 or 6 PM with supper.   Metformin 1000 mg two times a day.

## 2022-12-11 NOTE — Progress Notes (Signed)
Outpatient Endocrinology Note [name] Natania Finigan, MD  12/11/22  Patient's Name: [name] [name]    DOB: 01-21-1932    MRN: [id number]                                                    REASON OF VISIT: Follow up for type 2 diabetes mellitus  PCP: Ellyn Hack, MD  HISTORY OF PRESENT ILLNESS:   [name] [name] is a 89 y.o. old male with past medical history listed below, is here for follow up of type 2 diabetes mellitus.   Pertinent Diabetes History: Patient was diagnosed with type 2 diabetes mellitus in 1990.  He has been on insulin therapy since 2005.  He was on NPH and regular insulin in the past and also basal bolus regimen.  He had usually controlled diabetes mellitus with hemoglobin A1c near 7% in the past.  Chronic Diabetes Complications : Retinopathy: yes. Following with ophthalmology. Nephropathy: CKD, on losartan Peripheral neuropathy: yes, on gabapentin Coronary artery disease: yes, s/p CABG Stroke: no  Relevant comorbidities and cardiovascular risk factors: Obesity: no Body mass index is 30.66 kg/m.  Hypertension: yes Hyperlipidemia. Yes, on statin.   Current / Home Diabetic regimen includes: Metformin ER 1000 mg 2 times a day. Lantus 15 units at bedtime. Humalog 8 units with meals 2 times a day / morning and in the afternoon.   Prior diabetic medications: NPH and regular insulin  Glycemic data:   Not able to download the glucometer in the clinic today.  Reviewed blood sugar from the meter directly some of the blood sugar. 170, 238, 172, 243, 121, 176, 89,235, 136, 186, 158, 148, 147.  Hypoglycemia: Patient has no hypoglycemic episodes. Patient has hypoglycemia awareness.  Factors modifying glucose control: 1.  Diabetic diet assessment: 3 meals a day.  2.  Staying active or exercising: No formal exercise.  3.  Medication compliance: compliant most of the time.  Interval history 12/11/22 Patient is accompanied by daughter and reports that he had  glucose 41 and EMS called about 3 weeks ago. Then he started to take humalog 2 times a day as noted above.  Glucometer data as reviewed above.  Lowest blood sugar 89.  He has been checking blood sugar at different times of the day.  Most of the blood sugar acceptable.  No other complaints today.  Hemoglobin A1c 7.4% today.  REVIEW OF SYSTEMS As per history of present illness.   PAST MEDICAL HISTORY: Past Medical History:  Diagnosis Date   Benign prostatic hyperplasia (BPH) with urinary urge incontinence 11/13/2017   Chronic left shoulder pain 11/13/2017   Chronic non-seasonal allergic rhinitis 02/17/2006   Chronic systolic heart failure (HCC) 10/31/2016   Echo (05/03/2015): LVEF 45-50%, grade 2 diastolic dysfunction   Coronary artery disease involving native coronary artery of native heart without angina pectoris 09/30/2007   s/p CABG on 01/05/2013: left internal mammary artery to left anterior descending, saphenous vein graft to obtuse marginal 1, sequential saphenous vein graft to acute marginal and posterior descending   Essential hypertension 11/04/2005   Gastroesophageal reflux disease with hiatal hernia 11/04/2005   History of prostate cancer 02/17/2006   Diagnosed 1995, s/p seed implantation in the late 1990's, PSA undetectable 04/10/2016   Hyperlipidemia 11/04/2005   Obesity (BMI 30.0-34.9) 11/13/2017   Peripheral arterial disease (HCC) 09/30/2007  with claudication, failed attempt at LLE PTCA   Type 2 diabetes mellitus with microalbuminuria, with long-term current use of insulin (HCC) 11/13/2017   Type 2 diabetes mellitus with mild 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, bilateral (HCC) 03/24/2018   Type 2 diabetes mellitus with peripheral neuropathy (HCC) 11/04/2005   Urethral stricture in male 11/13/2017   Noted on cystoscopy 12/18/2014.  Reportedly asked to do chronic intermittent self catheterizations, but has not.    PAST SURGICAL HISTORY: Past Surgical History:   Procedure Laterality Date   BREAST SURGERY     CARDIAC CATHETERIZATION  10/05/2008   REVEALS HYPOKINESIS OF THE LATERAL WALL AND EF 50-55%   CORONARY ANGIOPLASTY WITH STENT PLACEMENT     CORONARY ARTERY BYPASS GRAFT N/A 01/05/2013   Procedure: CORONARY ARTERY BYPASS GRAFTING (CABG);  Surgeon: Loreli Slot, MD;  [facility] OR;  Service: Open Heart Surgery;  Laterality: N/A;   INTRAOPERATIVE TRANSESOPHAGEAL ECHOCARDIOGRAM N/A 01/05/2013   Procedure: INTRAOPERATIVE TRANSESOPHAGEAL ECHOCARDIOGRAM;  Surgeon: Loreli Slot, MD;  Location: [facility] OR;  Service: Open Heart Surgery;  Laterality: N/A;   LEFT HEART CATHETERIZATION WITH CORONARY ANGIOGRAM N/A 01/03/2013   Procedure: LEFT HEART CATHETERIZATION WITH CORONARY ANGIOGRAM;  Surgeon: [name], MD;  Location: [facility] CATH LAB;  Service: Cardiovascular;  Laterality: N/A;   LOWER EXTREMITY ANGIOGRAM Right 12/07/2012   unsuccessful attempt at percutaneous revascularization of a calcified long segment chronic total occlusion mid left SFA /notes 12/07/2012   LOWER EXTREMITY ANGIOGRAM N/A 12/07/2012   Procedure: LOWER EXTREMITY ANGIOGRAM;  Surgeon: [name], MD;  Location: [facility] CATH LAB;  Service: Cardiovascular;  Laterality: N/A;   PROSTATE SURGERY  10/04/1988    ALLERGIES: No Known Allergies  FAMILY HISTORY:  Family History  Problem Relation Age of Onset   Kidney failure Mother    Hypertension Father    Stroke Father    Other Sister    Other Brother    Other Brother    Other Brother    Other Brother    Other Brother    Other Brother    Other Brother    Other Brother    Healthy Daughter    Healthy Daughter    Healthy Daughter    Healthy Son     SOCIAL HISTORY: Social History   Socioeconomic History   Marital status: Married    Spouse name: Not on file   Number of children: 4   Years of education: 12   Highest education level: 12th grade  Occupational History   Occupation: [employer] doctor    Comment:  drove 42 years but now retired  Tobacco Use   Smoking status: Former    Current packs/day: 0.00    Average packs/day: 0.8 packs/day for 20.0 years (15.0 ttl pk-yrs)    Types: Cigarettes    Start date: 07/08/1960    Quit date: 07/08/1980    Years since quitting: 42.4   Smokeless tobacco: Never  Vaping Use   Vaping status: Never Used  Substance and Sexual Activity   Alcohol use: No    Alcohol/week: 0.0 standard drinks of alcohol    Comment: 12/07/2012 "quit drinking alcohol > 25 yr ago"   Drug use: No   Sexual activity: Not Currently  Other Topics Concern   Not on file  Social History Narrative   Occupation: Optician, dispensing   Married   Social Determinants of Health   Financial Resource Strain: Low Risk  (10/21/2022)   Received from [facility]   Overall Financial  Resource Strain (CARDIA)    Difficulty of Paying Living Expenses: Not very hard  Food Insecurity: No Food Insecurity (10/21/2022)   Received from [facility]   Hunger Vital Sign    Worried About Running Out of Food in the Last Year: Never true    Ran Out of Food in the Last Year: Never true  Transportation Needs: No Transportation Needs (10/21/2022)   Received from [facility] - Transportation    Lack of Transportation (Medical): No    Lack of Transportation (Non-Medical): No  Physical Activity: Inactive (10/09/2021)   Exercise Vital Sign    Days of Exercise per Week: 0 days    Minutes of Exercise per Session: 0 min  Stress: No Stress Concern Present (10/09/2021)   [employer] of Occupational Health - Occupational Stress Questionnaire    Feeling of Stress : Not at all  Social Connections: Socially Integrated (10/09/2021)   Social Connection and Isolation Panel [NHANES]    Frequency of Communication with Friends and Family: Twice a week    Frequency of Social Gatherings with Friends and Family: Twice a week    Attends Religious Services: More than 4 times per year    Active Member of [employer] or Organizations:  Yes    Attends Engineer, structural: More than 4 times per year    Marital Status: Married    MEDICATIONS:  Current Outpatient Medications  Medication Sig Dispense Refill   allopurinol (ZYLOPRIM) 100 MG tablet Take 2 tablets by mouth once daily 180 tablet 0   amLODipine (NORVASC) 2.5 MG tablet Take 1 tablet (2.5 mg total) by mouth daily. 90 tablet 3   aspirin EC 81 MG tablet Take 81 mg by mouth as needed. Swallow whole.     atorvastatin (LIPITOR) 40 MG tablet Take 1 tablet (40 mg total) by mouth daily. 90 tablet 3   benzonatate (TESSALON) 100 MG capsule Take 1 capsule (100 mg total) by mouth every 8 (eight) hours. 21 capsule 0   blood glucose meter kit and supplies KIT Dispense based on patient and insurance preference. Use up to four times daily as directed. 1 each 0   colchicine 0.6 MG tablet Take 1 tablet (0.6 mg total) by mouth 2 (two) times daily as needed (gout flare). 60 tablet 1   furosemide (LASIX) 20 MG tablet Take 1 tablet (20 mg total) by mouth as needed (SOB or wt gain). 30 tablet 1   gabapentin (NEURONTIN) 100 MG capsule Take 100 mg by mouth 2 (two) times daily. Pt takes 2 tablet 200 mg bid     losartan (COZAAR) 25 MG tablet Take 1 tablet (25 mg total) by mouth daily. 90 tablet 3   metoprolol succinate (TOPROL XL) 50 MG 24 hr tablet Take 1 tablet (50 mg total) by mouth daily. Take with or immediately following a meal. 90 tablet 3   metoprolol tartrate (LOPRESSOR) 25 MG tablet Take 2 tablets (50 mg total) by mouth 2 (two) times daily. 180 tablet 1   nitroGLYCERIN (NITROSTAT) 0.4 MG SL tablet Place 1 tablet (0.4 mg total) under the tongue every 5 (five) minutes as needed for chest pain. 25 tablet 3   pantoprazole (PROTONIX) 20 MG tablet Take 20 mg by mouth as needed for heartburn.     Push Button Safety Lancets 28G MISC 1 Lancet by Does not apply route 3 (three) times daily. 300 each 0   tamsulosin (FLOMAX) 0.4 MG CAPS capsule Take 1 capsule (0.4 mg total)  by mouth daily.  90 capsule 3   trospium (SANCTURA) 20 MG tablet Take 20 mg by mouth as needed.     cetirizine (ZYRTEC ALLERGY) 10 MG tablet Take 0.5 tablets (5 mg total) by mouth daily. (Patient taking differently: Take 5 mg by mouth as needed for allergies.) 15 tablet 0   insulin glargine (LANTUS) 100 UNIT/ML injection Inject 0.12 mLs (12 Units total) into the skin daily. 10 mL 11   insulin lispro (HUMALOG) 100 UNIT/ML injection Inject 0.05 mLs (5 Units total) into the skin 3 (three) times daily before meals. 8-10 units daily before meals 10 mL 11   metFORMIN (GLUCOPHAGE) 500 MG tablet Take 2 tablets (1,000 mg total) by mouth 2 (two) times daily with a meal. 120 tablet 5   Current Facility-Administered Medications  Medication Dose Route Frequency Provider Last Rate Last Admin   Insulin Pen Needle (NOVOFINE) 100 each  10 packet Subcutaneous TID Reather Littler, MD        PHYSICAL EXAM: Vitals:   12/11/22 1420  BP: 138/70  Pulse: 78  Resp: 20  SpO2: 99%  Weight: 232 lb 6.4 oz (105.4 kg)  Height: 6\' 1"  (1.854 m)    Body mass index is 30.66 kg/m.  Wt Readings from Last 3 Encounters:  12/11/22 232 lb 6.4 oz (105.4 kg)  12/09/22 231 lb 9.6 oz (105.1 kg)  10/15/22 226 lb 9.6 oz (102.8 kg)    General: Well developed, well nourished male in no apparent distress.  HEENT: AT/[place], no external lesions.  Eyes: Conjunctiva clear and no icterus. Neck: Neck supple  Lungs: Respirations not labored Neurologic: Alert, oriented, normal speech Extremities / Skin: Dry.   Psychiatric: Does not appear depressed or anxious  Diabetic Foot Exam - Simple   No data filed      LABS Reviewed Lab Results  Component Value Date   HGBA1C 7.4 (A) 12/11/2022   HGBA1C 8.2 (H) 09/01/2022   HGBA1C 8.9 (A) 07/02/2022   Lab Results  Component Value Date   FRUCTOSAMINE 312 (H) 11/28/2022   FRUCTOSAMINE 320 (H) 09/01/2022   Lab Results  Component Value Date   CHOL 152 07/02/2022   HDL 35.60 (L) 07/02/2022   LDLCALC 99  07/02/2022   TRIG 85.0 07/02/2022   CHOLHDL 4 07/02/2022   Lab Results  Component Value Date   MICRALBCREAT 5.4 02/04/2022   MICRALBCREAT 124 (H) 09/14/2020   Lab Results  Component Value Date   CREATININE 1.09 11/28/2022   Lab Results  Component Value Date   GFR 60.32 11/28/2022    ASSESSMENT / PLAN  1. Type 2 diabetes mellitus with diabetic polyneuropathy, with long-term current use of insulin (HCC)   2. Uncontrolled type 2 diabetes mellitus with hyperglycemia, with long-term current use of insulin (HCC)      Diabetes Mellitus type 2, complicated by diabetic neuropathy - Diabetic status / severity: uncontrolled.   Lab Results  Component Value Date   HGBA1C 7.4 (A) 12/11/2022    - Hemoglobin A1c goal : <8%  Diabetes regimen adjusted as follows.  Patient reports had hypoglycemia after blood sugar 41.  He has been taking Humalog 8 units with meals in the morning with breakfast and around 4 PM in the afternoon.  Decrease insulin regimen with a concern of hypoglycemia.  - Medications: See below  Diabetes regimen: Decrease Lantus to 12 units daily.  Humalog 5 units with breakfast at 8 or 9 AM, 5 units with lunch at 1 or 2 PM and 5  units with supper at 5 or 6 PM with supper.   Metformin 1000 mg two times a day.   - Home glucose testing: Before meals and at bedtime. - Discussed/ Gave Hypoglycemia treatment plan.  # Consult : not required at this time.   # Annual urine for microalbuminuria/ creatinine ratio, no microalbuminuria currently, continue ACE/ARB /losartan. Last  Lab Results  Component Value Date   MICRALBCREAT 5.4 02/04/2022    # Foot check nightly / neuropathy, continue gabapentin, managed by primary care provider.  # Annual dilated diabetic eye exams.  Follow-up with ophthalmology.  2. Blood pressure  -  BP Readings from Last 1 Encounters:  12/11/22 138/70    - Control is in target.  - No change in current plans.  3. Lipid status /  Hyperlipidemia - Last  Lab Results  Component Value Date   LDLCALC 99 07/02/2022   - Continue atorvastatin 40 mg daily.  Diagnoses and all orders for this visit:  Type 2 diabetes mellitus with diabetic polyneuropathy, with long-term current use of insulin (HCC) -     POCT glycosylated hemoglobin (Hb A1C)  Uncontrolled type 2 diabetes mellitus with hyperglycemia, with long-term current use of insulin (HCC) -     insulin glargine (LANTUS) 100 UNIT/ML injection; Inject 0.12 mLs (12 Units total) into the skin daily. -     insulin lispro (HUMALOG) 100 UNIT/ML injection; Inject 0.05 mLs (5 Units total) into the skin 3 (three) times daily before meals. 8-10 units daily before meals     DISPOSITION Follow up in clinic in 3 month suggested.   All questions answered and patient verbalized understanding of the plan.  [name] Tagan Bartram, MD [facility] [address] [place], [address] [place], [place] [address] Phone # [phone]  At least part of this note was generated using voice recognition software. Inadvertent word errors may have occurred, which were not recognized during the proofreading process.

## 2022-12-12 ENCOUNTER — Other Ambulatory Visit: Payer: Self-pay | Admitting: Endocrinology

## 2022-12-12 DIAGNOSIS — E1165 Type 2 diabetes mellitus with hyperglycemia: Secondary | ICD-10-CM

## 2022-12-15 ENCOUNTER — Emergency Department (HOSPITAL_COMMUNITY)
Admission: EM | Admit: 2022-12-15 | Discharge: 2022-12-15 | Disposition: A | Discharge status: Home / Self Care | Payer: Medicare Other | Attending: Emergency Medicine | Admitting: Emergency Medicine

## 2022-12-15 ENCOUNTER — Other Ambulatory Visit: Payer: Self-pay

## 2022-12-15 ENCOUNTER — Encounter (HOSPITAL_COMMUNITY): Payer: Self-pay

## 2022-12-15 DIAGNOSIS — R42 Dizziness and giddiness: Secondary | ICD-10-CM | POA: Diagnosis present

## 2022-12-15 DIAGNOSIS — Z79899 Other long term (current) drug therapy: Secondary | ICD-10-CM | POA: Insufficient documentation

## 2022-12-15 DIAGNOSIS — E1165 Type 2 diabetes mellitus with hyperglycemia: Secondary | ICD-10-CM | POA: Insufficient documentation

## 2022-12-15 DIAGNOSIS — Z794 Long term (current) use of insulin: Secondary | ICD-10-CM | POA: Insufficient documentation

## 2022-12-15 DIAGNOSIS — Z7984 Long term (current) use of oral hypoglycemic drugs: Secondary | ICD-10-CM | POA: Diagnosis not present

## 2022-12-15 DIAGNOSIS — Z7982 Long term (current) use of aspirin: Secondary | ICD-10-CM | POA: Insufficient documentation

## 2022-12-15 DIAGNOSIS — I2510 Atherosclerotic heart disease of native coronary artery without angina pectoris: Secondary | ICD-10-CM | POA: Insufficient documentation

## 2022-12-15 DIAGNOSIS — I5020 Unspecified systolic (congestive) heart failure: Secondary | ICD-10-CM | POA: Insufficient documentation

## 2022-12-15 DIAGNOSIS — I110 Hypertensive heart disease with heart failure: Secondary | ICD-10-CM | POA: Diagnosis not present

## 2022-12-15 LAB — BASIC METABOLIC PANEL
Anion gap: 8 (ref 5–15)
BUN: 23 mg/dL (ref 8–23)
CO2: 23 mmol/L (ref 22–32)
Calcium: 8.9 mg/dL (ref 8.9–10.3)
Chloride: 110 mmol/L (ref 98–111)
Creatinine, Ser: 1.08 mg/dL (ref 0.61–1.24)
GFR, Estimated: >60 mL/min (ref >60)
Glucose, Bld: 156 mg/dL — ABNORMAL HIGH (ref 70–99)
Potassium: 4.2 mmol/L (ref 3.5–5.1)
Sodium: 141 mmol/L (ref 135–145)

## 2022-12-15 LAB — CBC
HCT: 36.7 % — ABNORMAL LOW (ref 39.0–52.0)
Hemoglobin: 11.8 g/dL — ABNORMAL LOW (ref 13.0–17.0)
MCH: 30.7 pg (ref 26.0–34.0)
MCHC: 32.2 g/dL (ref 30.0–36.0)
MCV: 95.6 fL (ref 80.0–100.0)
Platelets: 163 10*3/uL (ref 150–400)
RBC: 3.84 MIL/uL — ABNORMAL LOW (ref 4.22–5.81)
RDW: 13.9 % (ref 11.5–15.5)
WBC: 9.1 10*3/uL (ref 4.0–10.5)
nRBC: 0 % (ref 0.0–0.2)

## 2022-12-15 LAB — URINALYSIS, ROUTINE W REFLEX MICROSCOPIC
Bilirubin Urine: NEGATIVE
Glucose, UA: NEGATIVE mg/dL
Hgb urine dipstick: NEGATIVE
Ketones, ur: NEGATIVE mg/dL
Nitrite: NEGATIVE
Protein, ur: NEGATIVE mg/dL
Specific Gravity, Urine: 1.012 (ref 1.005–1.030)
WBC, UA: >50 WBC/hpf (ref 0–5)
pH: 5 (ref 5.0–8.0)

## 2022-12-15 LAB — CBG MONITORING, ED: Glucose-Capillary: 139 mg/dL — ABNORMAL HIGH (ref 70–99)

## 2022-12-15 NOTE — ED Notes (Signed)
ED Provider at bedside. 

## 2022-12-15 NOTE — ED Notes (Signed)
Family at bedside. 

## 2022-12-15 NOTE — Discharge Instructions (Addendum)
Your blood work is reassuring.  We have sent your urine for culture and if that grows a significant amount of bacteria we will be in touch with you for an antibiotic.  Otherwise, you are slightly anemic and I would recommend you follow-up with your primary care doctor.  I do not suspect this is related to your brief staggering episode.  If you begin to have any chest pain/pressure or new symptoms, you may always return.

## 2022-12-15 NOTE — ED Triage Notes (Signed)
Pt reports that when he woke up this morning he "staggered". Pt reports that after he hate an egg and some peanut butter he felt better.

## 2022-12-15 NOTE — ED Provider Notes (Signed)
[place] EMERGENCY DEPARTMENT AT [facility] Provider Note   CSN: [id number] Arrival date & time: 12/15/22  4098     History  Chief Complaint  Patient presents with   Near Syncope    [name] [name] is a 89 y.o. male.  89 year old male with medical history of HTN, HLD, CAD, T2DM, HFmrEF, gout who woke up this morning and states directly when he got out of bed he staggered and felt off balance.  One of his daughters presents at bedside with him.  Patient states he laid back in bed and felt better.  He then ate breakfast which was an egg and some peanut butter and has felt better since then.  He lives on his own but has several family members that see him daily.  His daughter checked his blood pressure at the time of this event which was 159/80 and CBG which was 87.  They also note that he has been complaining of headaches after taking his medications.  Patient denies headaches today.  He states the headaches are not every time he takes his medication but they are in the front of his head.  He denies currently or previously having chest pain/pressure, shortness of breath, fevers.   Near Syncope Pertinent negatives include no chest pain, no abdominal pain, no headaches and no shortness of breath.       Home Medications Prior to Admission medications   Medication Sig Start Date End Date Taking? Authorizing Provider  allopurinol (ZYLOPRIM) 100 MG tablet Take 2 tablets by mouth once daily 12/10/21   Inez Catalina, MD  amLODipine (NORVASC) 2.5 MG tablet Take 1 tablet (2.5 mg total) by mouth daily. 12/09/22   Sharlene Dory, PA-C  aspirin EC 81 MG tablet Take 81 mg by mouth as needed. Swallow whole.    [provider]  atorvastatin (LIPITOR) 40 MG tablet Take 1 tablet (40 mg total) by mouth daily. 07/29/22   Nahser, Deloris Ping, MD  benzonatate (TESSALON) 100 MG capsule Take 1 capsule (100 mg total) by mouth every 8 (eight) hours. 10/06/22   White, Elizabeth A, PA-C   blood glucose meter kit and supplies KIT Dispense based on patient and insurance preference. Use up to four times daily as directed. 11/08/21   Adron Bene, MD  cetirizine (ZYRTEC ALLERGY) 10 MG tablet Take 0.5 tablets (5 mg total) by mouth daily. Patient taking differently: Take 5 mg by mouth as needed for allergies. 10/06/22 12/09/22  Landis Martins, PA-C  colchicine 0.6 MG tablet Take 1 tablet (0.6 mg total) by mouth 2 (two) times daily as needed (gout flare). 09/23/21   Inez Catalina, MD  furosemide (LASIX) 20 MG tablet Take 1 tablet (20 mg total) by mouth as needed (SOB or wt gain). 12/09/22   Sharlene Dory, PA-C  gabapentin (NEURONTIN) 100 MG capsule Take 100 mg by mouth 2 (two) times daily. Pt takes 2 tablet 200 mg bid    [provider]  insulin glargine (LANTUS) 100 UNIT/ML injection Inject 0.12 mLs (12 Units total) into the skin daily. 12/11/22   Thapa, [place], MD  insulin lispro (HUMALOG) 100 UNIT/ML injection INJECT 5 UNITS INTO THE SKIN 3 TIMES PER DAY BEFORE EACH MEAL 12/12/22   Thapa, [place], MD  losartan (COZAAR) 25 MG tablet Take 1 tablet (25 mg total) by mouth daily. 12/09/22   Sharlene Dory, PA-C  metFORMIN (GLUCOPHAGE) 500 MG tablet Take 2 tablets (1,000 mg total) by mouth 2 (two) times  daily with a meal. 11/29/21 12/09/22  Modena Slater, DO  metoprolol succinate (TOPROL XL) 50 MG 24 hr tablet Take 1 tablet (50 mg total) by mouth daily. Take with or immediately following a meal. 12/09/22   Sharlene Dory, PA-C  metoprolol tartrate (LOPRESSOR) 25 MG tablet Take 2 tablets (50 mg total) by mouth 2 (two) times daily. 09/18/21   Gwenevere Abbot, MD  nitroGLYCERIN (NITROSTAT) 0.4 MG SL tablet Place 1 tablet (0.4 mg total) under the tongue every 5 (five) minutes as needed for chest pain. 09/09/22   Sharlene Dory, PA-C  pantoprazole (PROTONIX) 20 MG tablet Take 20 mg by mouth as needed for heartburn.    [provider]  Push Button Safety Lancets 28G MISC 1 Lancet by Does not  apply route 3 (three) times daily. 10/15/22   Thapa, [place], MD  tamsulosin (FLOMAX) 0.4 MG CAPS capsule Take 1 capsule (0.4 mg total) by mouth daily. 09/14/20   Inez Catalina, MD  trospium (SANCTURA) 20 MG tablet Take 20 mg by mouth as needed. 05/24/21   [provider]      Allergies    Patient has no known allergies.    Review of Systems   Review of Systems  Constitutional:  Negative for fever.  Respiratory:  Negative for shortness of breath.   Cardiovascular:  Positive for near-syncope. Negative for chest pain.  Gastrointestinal:  Negative for abdominal pain.  Neurological:  Negative for dizziness, syncope, weakness and headaches.    Physical Exam Updated Vital Signs BP (!) 154/99   Pulse 98   Temp 97.6 F (36.4 C)   Resp 14   Ht 6\' 1"  (1.854 m)   Wt 99.8 kg   SpO2 100%   BMI 29.03 kg/m  Physical Exam Constitutional:      Appearance: Normal appearance.  Eyes:     Extraocular Movements: Extraocular movements intact.     Pupils: Pupils are equal, round, and reactive to light.  Cardiovascular:     Rate and Rhythm: Normal rate and regular rhythm.     Heart sounds: Normal heart sounds.  Pulmonary:     Breath sounds: Normal breath sounds.  Abdominal:     General: Abdomen is flat. Bowel sounds are normal.     Palpations: Abdomen is soft.  Musculoskeletal:     Cervical back: Normal range of motion.  Neurological:     General: No focal deficit present.     Mental Status: He is alert and oriented to person, place, and time.     Cranial Nerves: No cranial nerve deficit.     Sensory: No sensory deficit.     Motor: No weakness.     Coordination: Coordination normal.     Comments: Finger-nose testing unremarkable  Psychiatric:        Mood and Affect: Mood normal.     ED Results / Procedures / Treatments   Labs (all labs ordered are listed, but only abnormal results are displayed) Labs Reviewed  BASIC METABOLIC PANEL - Abnormal; Notable for the following  components:      Result Value   Glucose, Bld 156 (*)    All other components within normal limits  CBC - Abnormal; Notable for the following components:   RBC 3.84 (*)    Hemoglobin 11.8 (*)    HCT 36.7 (*)    All other components within normal limits  URINALYSIS, ROUTINE W REFLEX MICROSCOPIC - Abnormal; Notable for the following components:   APPearance HAZY (*)  Leukocytes,Ua LARGE (*)    Bacteria, UA MANY (*)    All other components within normal limits  CBG MONITORING, ED - Abnormal; Notable for the following components:   Glucose-Capillary 139 (*)    All other components within normal limits  URINE CULTURE    EKG: NSR, no QT prolongation or ST elevation or T wave inversion  Radiology No results found.  Procedures Procedures    Medications Ordered in ED Medications - No data to display  ED Course/ Medical Decision Making/ A&P                                 Medical Decision Making 89 year old male with medical history of HTN, HLD, T2DM, CAD and HFmrEF who presented with a staggering episode first thing in the morning.  It appropriately reported blood pressure and CBG at the time of the event.  Presented to the ED for workup and reported inconsistent frontal headaches strictly in the evening around 1 taking his evening medications although during encounter no active symptomatology.  Reviewed his medication regimen and all medications he takes in the evening are additional dosing of medication he takes in the morning.  Vital signs unremarkable with exception of initial blood pressure 93/56 which improved without supportive measures to hypertensive ranges of 150-160/70s-90s.  Additionally, physical exam was unremarkable.  Considered hypoglycemia, electrolyte abnormalities, cardiac etiology, stroke or seizure, anemia.  Given lack of symptomatology and grossly normal labs with the exception of slightly elevated glucose in the 150s and hemoglobin 11.8, these concerns been ruled  out with section of anemia.  Further considered orthostasis however orthostatic vitals were negative and he did not have reproducible dizziness or other issues in all 3 positions.  Patient remained stable throughout encounter.  He remained stable for discharge.  Recommended follow-up with PCP for anemia.  UA revealed many bacteria with large leukocytes and WBC> 50 however absence of dysuria therefore will send for urine culture and follow-up with patient.  He and family are amenable to discharge.  Amount and/or Complexity of Data Reviewed Labs: ordered.         Final Clinical Impression(s) / ED Diagnoses Final diagnoses:  Dizziness   Rx / DC Orders ED Discharge Orders     None        Shelby Mattocks, DO 12/15/22 1119    Long, Arlyss Repress, MD 12/25/22 1452

## 2022-12-18 ENCOUNTER — Other Ambulatory Visit: Payer: Self-pay | Admitting: Student

## 2022-12-18 DIAGNOSIS — Z794 Long term (current) use of insulin: Secondary | ICD-10-CM

## 2022-12-18 LAB — URINE CULTURE: Culture: >=100000 — AB

## 2022-12-19 NOTE — Telephone Encounter (Signed)
Post ED Visit - Positive Culture Follow-up  Culture report reviewed by antimicrobial stewardship pharmacist: [facility] [x]  Lennie Muckle Pharm.D. []  Celedonio Miyamoto, Pharm.D., BCPS AQ-ID []  Garvin Fila, Pharm.D., BCPS []  Georgina Pillion, Pharm.D., BCPS []  [place], [address].D., BCPS, AAHIVP []  Estella Husk, Pharm.D., BCPS, AAHIVP []  Lysle Pearl, PharmD, BCPS []  Phillips Climes, PharmD, BCPS []  Agapito Games, PharmD, BCPS []  Verlan Friends, PharmD []  Mervyn Gay, PharmD, BCPS []  Vinnie Level, PharmD  [facility] []  Len Childs, PharmD []  Greer Pickerel, PharmD []  Adalberto Cole, PharmD []  Perlie Gold, Rph []  Lonell Face) Jean Rosenthal, PharmD []  Earl Many, PharmD []  Junita Push, PharmD []  Dorna Leitz, PharmD []  Terrilee Files, PharmD []  Lynann Beaver, PharmD []  Keturah Barre, PharmD []  Loralee Pacas, PharmD []  Bernadene Person, PharmD   Positive urine culture Treated with no antibiotics, likely colonized per Dr Glyn Ade, no further patient follow-up is required at this time.  Bing Quarry 12/19/2022, 9:14 AM

## 2022-12-19 NOTE — Progress Notes (Signed)
ED Antimicrobial Stewardship Positive Culture Follow Up   [name] [name] is an 89 y.o. male who presented to [facility] on 12/15/2022 with a chief complaint of  Chief Complaint  Patient presents with   Near Syncope    Recent Results (from the past 720 hour(s))  Urine Culture     Status: Abnormal   Collection Time: 12/15/22 11:04 AM   Specimen: Urine, Clean Catch  Result Value Ref Range Status   Specimen Description URINE, CLEAN CATCH  Final   Special Requests   Final    NONE Performed at [facility], [address], [place], [place] [address]    Culture >=100,000 COLONIES/mL KLEBSIELLA PNEUMONIAE (A)  Final   Report Status 12/18/2022 FINAL  Final   Organism ID, Bacteria KLEBSIELLA PNEUMONIAE (A)  Final      Susceptibility   Klebsiella pneumoniae - MIC*    AMPICILLIN >=32 RESISTANT Resistant     CEFAZOLIN <=4 SENSITIVE Sensitive     CEFEPIME <=0.12 SENSITIVE Sensitive     CEFTRIAXONE <=0.25 SENSITIVE Sensitive     CIPROFLOXACIN <=0.25 SENSITIVE Sensitive     GENTAMICIN <=1 SENSITIVE Sensitive     IMIPENEM <=0.25 SENSITIVE Sensitive     NITROFURANTOIN 128 RESISTANT Resistant     TRIMETH/SULFA <=20 SENSITIVE Sensitive     AMPICILLIN/SULBACTAM 4 SENSITIVE Sensitive     PIP/TAZO <=4 SENSITIVE Sensitive ug/mL    * >=100,000 COLONIES/mL KLEBSIELLA PNEUMONIAE    [x]  Patient discharged originally without antimicrobial agent and treatment is not indicated due to asymptomatic bacteriuria   ED Provider: Glyn Ade, MD   Lennie Muckle, PharmD PGY1 Pharmacy Resident 12/19/2022 8:02 AM

## 2022-12-24 ENCOUNTER — Other Ambulatory Visit: Payer: Self-pay

## 2022-12-24 ENCOUNTER — Telehealth: Payer: Self-pay | Admitting: Endocrinology

## 2022-12-24 DIAGNOSIS — Z794 Long term (current) use of insulin: Secondary | ICD-10-CM

## 2022-12-24 DIAGNOSIS — E113293 Type 2 diabetes mellitus with mild 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, bilateral: Secondary | ICD-10-CM

## 2022-12-24 MED ORDER — METFORMIN HCL 500 MG PO TABS: 1000.0000 mg | ORAL_TABLET | Freq: Two times a day (BID) | ORAL | 5 refills | Status: DC

## 2022-12-24 NOTE — Telephone Encounter (Signed)
MEDICATION: Metformin  PHARMACY:  [facility] on [address]  HAS THE PATIENT CONTACTED THEIR PHARMACY?  YES  IS THIS A 90 DAY SUPPLY : Yes  IS PATIENT OUT OF MEDICATION: YES  IF NOT; HOW MUCH IS LEFT:   LAST APPOINTMENT DATE: @11 /09/2022  NEXT APPOINTMENT DATE:@2 /11/2023  DO WE HAVE YOUR PERMISSION TO LEAVE A DETAILED MESSAGE?:  OTHER COMMENTS:    **Let patient know to contact pharmacy at the end of the day to make sure medication is ready. **  ** Please notify patient to allow 48-72 hours to process**  **Encourage patient to contact the pharmacy for refills or they can request refills through [facility]**

## 2022-12-24 NOTE — Telephone Encounter (Signed)
Metformin refill request complete

## 2023-03-16 ENCOUNTER — Encounter: Payer: Self-pay | Admitting: Endocrinology

## 2023-03-16 ENCOUNTER — Ambulatory Visit: Payer: Medicare PPO | Admitting: Endocrinology

## 2023-03-16 VITALS — BP 126/68 | HR 72 | Ht 73.0 in | Wt 245.0 lb

## 2023-03-16 DIAGNOSIS — E1142 Type 2 diabetes mellitus with diabetic polyneuropathy: Secondary | ICD-10-CM | POA: Diagnosis not present

## 2023-03-16 DIAGNOSIS — Z794 Long term (current) use of insulin: Secondary | ICD-10-CM

## 2023-03-16 DIAGNOSIS — E113293 Type 2 diabetes mellitus with mild 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, bilateral: Secondary | ICD-10-CM | POA: Diagnosis not present

## 2023-03-16 LAB — POCT GLYCOSYLATED HEMOGLOBIN (HGB A1C): Hemoglobin A1C: 7.2 % — AB (ref 4.0–5.6)

## 2023-03-16 MED ORDER — METFORMIN HCL 500 MG PO TABS: ORAL_TABLET | ORAL | 3 refills | Status: DC

## 2023-03-16 NOTE — Progress Notes (Signed)
 Outpatient Endocrinology Note [name], MD  03/16/23  Patient's Name: [name] [name]    DOB: 1931/04/07    MRN: [id number]                                                    REASON OF VISIT: Follow up for type 2 diabetes mellitus  PCP: Ulysees Gander, MD  HISTORY OF PRESENT ILLNESS:   [name]  [name] is a 89 y.o. old male with past medical history listed below, is here for follow up of type 2 diabetes mellitus.   Pertinent Diabetes History: Patient was diagnosed with type 2 diabetes mellitus in 1990.  He has been on insulin  therapy since 2005.  He was on NPH and regular insulin  in the past and also basal bolus regimen.  He had usually controlled diabetes mellitus with hemoglobin A1c near 7% in the past.  Chronic Diabetes Complications : Retinopathy: yes. Following with ophthalmology. Nephropathy: CKD, on losartan  Peripheral neuropathy: yes, on gabapentin  Coronary artery disease: yes, s/p CABG Stroke: no  Relevant comorbidities and cardiovascular risk factors: Obesity: no Body mass index is 32.32 kg/m.  Hypertension: yes Hyperlipidemia. Yes, on statin.   Current / Home Diabetic regimen includes: Metformin  ER 1000 mg in the morning and 500 mg in the evening. Lantus  12 units at bedtime. Humalog  5 units with meals 3 times a day / morning and in the afternoon.   Prior diabetic medications: NPH and regular insulin   Glycemic data:   Not able to download the glucometer in the clinic today.  Reviewed blood sugar from the meter directly some of the blood sugar. 7 days average blood sugar 123 and 14 days average blood sugar 120.    Hypoglycemia: Patient has no hypoglycemic episodes. Patient has hypoglycemia awareness.  Factors modifying glucose control: 1.  Diabetic diet assessment: 3 meals a day.  2.  Staying active or exercising: No formal exercise.  3.  Medication compliance: compliant most of the time.  Interval history  A1c improved to 7.2%.  Diabetes  regimen reviewed and as noted above.  Insulin  dose confirmed with daughter and son-in-law.  Overall feeling good.  Denies numbness or tingling of the feet.  Glucometer data as reviewed above.  No other complaints today.  Reports she has been taking metformin  1000 mg in the morning and 500 mg in the evening.  Patient is accompanied by daughter in the clinic today.  REVIEW OF SYSTEMS As per history of present illness.   PAST MEDICAL HISTORY: Past Medical History:  Diagnosis Date   Benign prostatic hyperplasia (BPH) with urinary urge incontinence 11/13/2017   Chronic left shoulder pain 11/13/2017   Chronic non-seasonal allergic rhinitis 02/17/2006   Chronic systolic heart failure (HCC) 10/31/2016   Echo (05/03/2015): LVEF 45-50%, grade 2 diastolic dysfunction   Coronary artery disease involving native coronary artery of native heart without angina pectoris 09/30/2007   s/p CABG on 01/05/2013: left internal mammary artery to left anterior descending, saphenous vein graft to obtuse marginal 1, sequential saphenous vein graft to acute marginal and posterior descending   Essential hypertension 11/04/2005   Gastroesophageal reflux disease with hiatal hernia 11/04/2005   History of prostate cancer 02/17/2006   Diagnosed 1995, s/p seed implantation in the late 1990's, PSA undetectable 04/10/2016   Hyperlipidemia 11/04/2005   Obesity (BMI 30.0-34.9) 11/13/2017   Peripheral  arterial disease (HCC) 09/30/2007   with claudication, failed attempt at LLE PTCA   Type 2 diabetes mellitus with microalbuminuria, with long-term current use of insulin  (HCC) 11/13/2017   Type 2 diabetes mellitus with mild 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, bilateral (HCC) 03/24/2018   Type 2 diabetes mellitus with peripheral neuropathy (HCC) 11/04/2005   Urethral stricture in male 11/13/2017   Noted on cystoscopy 12/18/2014.  Reportedly asked to do chronic intermittent self catheterizations, but has not.     PAST SURGICAL HISTORY: Past Surgical History:  Procedure Laterality Date   BREAST SURGERY     CARDIAC CATHETERIZATION  10/05/2008   REVEALS HYPOKINESIS OF THE LATERAL WALL AND EF 50-55%   CORONARY ANGIOPLASTY WITH STENT PLACEMENT     CORONARY ARTERY BYPASS GRAFT N/A 01/05/2013   Procedure: CORONARY ARTERY BYPASS GRAFTING (CABG);  Surgeon: Zelphia Higashi, MD;  Location: [facility] OR;  Service: Open Heart Surgery;  Laterality: N/A;   INTRAOPERATIVE TRANSESOPHAGEAL ECHOCARDIOGRAM N/A 01/05/2013   Procedure: INTRAOPERATIVE TRANSESOPHAGEAL ECHOCARDIOGRAM;  Surgeon: Zelphia Higashi, MD;  Location: [facility] OR;  Service: Open Heart Surgery;  Laterality: N/A;   LEFT HEART CATHETERIZATION WITH CORONARY ANGIOGRAM N/A 01/03/2013   Procedure: LEFT HEART CATHETERIZATION WITH CORONARY ANGIOGRAM;  Surgeon: Avanell Leigh, MD;  Location: [facility] CATH LAB;  Service: Cardiovascular;  Laterality: N/A;   LOWER EXTREMITY ANGIOGRAM Right 12/07/2012   unsuccessful attempt at percutaneous revascularization of a calcified long segment chronic total occlusion mid left SFA /notes 12/07/2012   LOWER EXTREMITY ANGIOGRAM N/A 12/07/2012   Procedure: LOWER EXTREMITY ANGIOGRAM;  Surgeon: Avanell Leigh, MD;  Location: [facility] CATH LAB;  Service: Cardiovascular;  Laterality: N/A;   PROSTATE SURGERY  10/04/1988    ALLERGIES: No Known Allergies  FAMILY HISTORY:  Family History  Problem Relation Age of Onset   Kidney failure Mother    Hypertension Father    Stroke Father    Other Sister    Other Brother    Other Brother    Other Brother    Other Brother    Other Brother    Other Brother    Other Brother    Other Brother    Healthy Daughter    Healthy Daughter    Healthy Daughter    Healthy Son     SOCIAL HISTORY: Social History   Socioeconomic History   Marital status: Married    Spouse name: Not on file   Number of children: 4   Years of education: 12   Highest education level: 12th grade  Occupational  History   Occupation: [employer] doctor    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  Tobacco Use   Smoking status: Former    Current packs/day: 0.00    Average packs/day: 0.8 packs/day for 20.0 years (15.0 ttl pk-yrs)    Types: Cigarettes    Start date: 07/08/1960    Quit date: 07/08/1980    Years since quitting: 42.7   Smokeless tobacco: Never  Vaping Use   Vaping status: Never Used  Substance and Sexual Activity   Alcohol use: No    Alcohol/week: 0.0 standard drinks of alcohol    Comment: 12/07/2012 "quit drinking alcohol > 25 yr ago"   Drug use: No   Sexual activity: Not Currently  Other Topics Concern   Not on file  Social History Narrative   Occupation: Optician, dispensing   Married   Social Drivers of Health   Financial Resource Strain: Low Risk  (10/21/2022)   Received from  [facility]   Overall Financial Resource Strain (CARDIA)    Difficulty of Paying Living Expenses: Not very hard  Food Insecurity: No Food Insecurity (10/21/2022)   Received from [facility]   Hunger Vital Sign    Worried About Running Out of Food in the Last Year: Never true    Ran Out of Food in the Last Year: Never true  Transportation Needs: No Transportation Needs (10/21/2022)   Received from [facility] - Transportation    Lack of Transportation (Medical): No    Lack of Transportation (Non-Medical): No  Physical Activity: Inactive (10/09/2021)   Exercise Vital Sign    Days of Exercise per Week: 0 days    Minutes of Exercise per Session: 0 min  Stress: No Stress Concern Present (10/09/2021)   [employer] of Occupational Health - Occupational Stress Questionnaire    Feeling of Stress : Not at all  Social Connections: Socially Integrated (10/09/2021)   Social Connection and Isolation Panel [NHANES]    Frequency of Communication with Friends and Family: Twice a week    Frequency of Social Gatherings with Friends and Family: Twice a week    Attends Religious Services: More than 4 times per year     Active Member of [employer] or Organizations: Yes    Attends Engineer, structural: More than 4 times per year    Marital Status: Married    MEDICATIONS:  Current Outpatient Medications  Medication Sig Dispense Refill   allopurinol  (ZYLOPRIM ) 100 MG tablet Take 2 tablets by mouth once daily 180 tablet 0   amLODipine  (NORVASC ) 2.5 MG tablet Take 1 tablet (2.5 mg total) by mouth daily. 90 tablet 3   aspirin  EC 81 MG tablet Take 81 mg by mouth as needed. Swallow whole.     atorvastatin  (LIPITOR) 40 MG tablet Take 1 tablet (40 mg total) by mouth daily. 90 tablet 3   benzonatate  (TESSALON ) 100 MG capsule Take 1 capsule (100 mg total) by mouth every 8 (eight) hours. 21 capsule 0   blood glucose meter kit and supplies KIT Dispense based on patient and insurance preference. Use up to four times daily as directed. 1 each 0   colchicine  0.6 MG tablet Take 1 tablet (0.6 mg total) by mouth 2 (two) times daily as needed (gout flare). 60 tablet 1   furosemide  (LASIX ) 20 MG tablet Take 1 tablet (20 mg total) by mouth as needed (SOB or wt gain). 30 tablet 1   gabapentin  (NEURONTIN ) 100 MG capsule Take 100 mg by mouth 2 (two) times daily. Pt takes 2 tablet 200 mg bid     insulin  glargine (LANTUS ) 100 UNIT/ML injection Inject 0.12 mLs (12 Units total) into the skin daily. 10 mL 11   insulin  lispro (HUMALOG ) 100 UNIT/ML injection INJECT 5 UNITS INTO THE SKIN 3 TIMES PER DAY BEFORE EACH MEAL 10 mL 11   losartan  (COZAAR ) 25 MG tablet Take 1 tablet (25 mg total) by mouth daily. 90 tablet 3   metoprolol  succinate (TOPROL  XL) 50 MG 24 hr tablet Take 1 tablet (50 mg total) by mouth daily. Take with or immediately following a meal. 90 tablet 3   metoprolol  tartrate (LOPRESSOR ) 25 MG tablet Take 2 tablets (50 mg total) by mouth 2 (two) times daily. 180 tablet 1   nitroGLYCERIN  (NITROSTAT ) 0.4 MG SL tablet Place 1 tablet (0.4 mg total) under the tongue every 5 (five) minutes as needed for chest pain. 25 tablet 3  pantoprazole  (PROTONIX ) 20 MG tablet Take 20 mg by mouth as needed for heartburn.     Push Button Safety Lancets 28G MISC 1 Lancet by Does not apply route 3 (three) times daily. 300 each 0   tamsulosin  (FLOMAX ) 0.4 MG CAPS capsule Take 1 capsule (0.4 mg total) by mouth daily. 90 capsule 3   trospium (SANCTURA) 20 MG tablet Take 20 mg by mouth as needed.     cetirizine  (ZYRTEC  ALLERGY) 10 MG tablet Take 0.5 tablets (5 mg total) by mouth daily. (Patient taking differently: Take 5 mg by mouth as needed for allergies.) 15 tablet 0   metFORMIN  (GLUCOPHAGE ) 500 MG tablet Take 2 tablets in the morning and 1 tablet in the evening. 270 tablet 3   Current Facility-Administered Medications  Medication Dose Route Frequency Provider Last Rate Last Admin   Insulin  Pen Needle (NOVOFINE) 100 each  10 packet Subcutaneous TID Lajean Pike, MD        PHYSICAL EXAM: Vitals:   03/16/23 1409  BP: 126/68  Pulse: 72  SpO2: 99%  Weight: 245 lb (111.1 kg)  Height: 6\' 1"  (1.854 m)     Body mass index is 32.32 kg/m.  Wt Readings from Last 3 Encounters:  03/16/23 245 lb (111.1 kg)  12/15/22 220 lb (99.8 kg)  12/11/22 232 lb 6.4 oz (105.4 kg)    General: Well developed, well nourished male in no apparent distress.  HEENT: AT/[place], no external lesions.  Eyes: Conjunctiva clear and no icterus. Neck: Neck supple  Lungs: Respirations not labored Neurologic: Alert, oriented, normal speech Extremities / Skin: Dry.   Psychiatric: Does not appear depressed or anxious  Diabetic Foot Exam - Simple   Simple Foot Form Visual Inspection See comments: Yes Sensation Testing See comments: Yes Pulse Check See comments: Yes Comments DP palpable bilaterally. Monofilament exam mildly diminished bilaterally. Mild callus +, no ulcer.        LABS Reviewed Lab Results  Component Value Date   HGBA1C 7.2 (A) 03/16/2023   HGBA1C 7.4 (A) 12/11/2022   HGBA1C 8.2 (H) 09/01/2022   Lab Results  Component Value Date    FRUCTOSAMINE 312 (H) 11/28/2022   FRUCTOSAMINE 320 (H) 09/01/2022   Lab Results  Component Value Date   CHOL 93 (L) 12/09/2022   HDL 39 (L) 12/09/2022   LDLCALC 41 12/09/2022   TRIG 55 12/09/2022   CHOLHDL 2.4 12/09/2022   Lab Results  Component Value Date   MICRALBCREAT 5.4 02/04/2022   MICRALBCREAT 124 (H) 09/14/2020   Lab Results  Component Value Date   CREATININE 1.08 12/15/2022   Lab Results  Component Value Date   GFR 60.32 11/28/2022    ASSESSMENT / PLAN  1. Type 2 diabetes mellitus with diabetic polyneuropathy, with long-term current use of insulin  (HCC)   2. Type 2 diabetes mellitus with both eyes affected by mild nonproliferative retinopathy without macular edema, with long-term current use of insulin  (HCC)     Diabetes Mellitus type 2, complicated by diabetic neuropathy /retinopathy/CKD - Diabetic status / severity: fair controlled.   Lab Results  Component Value Date   HGBA1C 7.2 (A) 03/16/2023    - Hemoglobin A1c goal : <7.5%  Patient has better control of diabetes mellitus.  - Medications: See below no change.  Diabetes regimen: Continue Lantus  12 units daily.  Humalog  5 units with breakfast at 8 or 9 AM, 5 units with lunch at 1 or 2 PM and 5 units with supper at 5 or 6 PM  with supper.   Metformin  1000 mg the morning and 500 mg in the evening.   - Home glucose testing: Before meals and at bedtime. - Discussed/ Gave Hypoglycemia treatment plan.  # Consult : not required at this time.   # Annual urine for microalbuminuria/ creatinine ratio, no microalbuminuria currently, continue ACE/ARB /losartan .  Will check today. Last  Lab Results  Component Value Date   MICRALBCREAT 5.4 02/04/2022    # Foot check nightly / neuropathy, continue gabapentin , managed by primary care provider.  # Annual dilated diabetic eye exams.  Follow-up with ophthalmology.  2. Blood pressure  -  BP Readings from Last 1 Encounters:  03/16/23 126/68    -  Control is in target.  - No change in current plans.  3. Lipid status / Hyperlipidemia - Last  Lab Results  Component Value Date   LDLCALC 41 12/09/2022   - Continue atorvastatin  40 mg daily.  Diagnoses and all orders for this visit:  Type 2 diabetes mellitus with diabetic polyneuropathy, with long-term current use of insulin  (HCC) -     POCT glycosylated hemoglobin (Hb A1C) -     Microalbumin / creatinine urine ratio  Type 2 diabetes mellitus with both eyes affected by mild nonproliferative retinopathy without macular edema, with long-term current use of insulin  (HCC) -     metFORMIN  (GLUCOPHAGE ) 500 MG tablet; Take 2 tablets in the morning and 1 tablet in the evening.    DISPOSITION Follow up in clinic in 3 month suggested.   All questions answered and patient verbalized understanding of the plan.  [place] Hollin Crewe, MD [facility] Endocrinology [facility] [address] [place], [address] [place], [place] [address] Phone # [phone]  At least part of this note was generated using voice recognition software. Inadvertent word errors may have occurred, which were not recognized during the proofreading process.

## 2023-03-17 LAB — MICROALBUMIN / CREATININE URINE RATIO
Creatinine, Urine: 113 mg/dL (ref 20–320)
Microalb Creat Ratio: 260 mg/g{creat} — ABNORMAL HIGH (ref <30)
Microalb, Ur: 29.4 mg/dL

## 2023-04-17 LAB — LAB REPORT - SCANNED: A1c: 7.8

## 2023-04-20 ENCOUNTER — Encounter (HOSPITAL_COMMUNITY): Payer: Self-pay

## 2023-04-20 ENCOUNTER — Emergency Department (HOSPITAL_COMMUNITY)
Admission: EM | Admit: 2023-04-20 | Discharge: 2023-04-20 | Disposition: A | Discharge status: Home / Self Care | Attending: Emergency Medicine | Admitting: Emergency Medicine

## 2023-04-20 ENCOUNTER — Encounter (HOSPITAL_COMMUNITY): Payer: Self-pay | Admitting: Emergency Medicine

## 2023-04-20 ENCOUNTER — Ambulatory Visit (HOSPITAL_COMMUNITY)
Admission: EM | Admit: 2023-04-20 | Discharge: 2023-04-20 | Disposition: A | Discharge status: Home / Self Care | Attending: Family Medicine | Admitting: Family Medicine

## 2023-04-20 ENCOUNTER — Other Ambulatory Visit: Payer: Self-pay

## 2023-04-20 DIAGNOSIS — E162 Hypoglycemia, unspecified: Secondary | ICD-10-CM | POA: Insufficient documentation

## 2023-04-20 DIAGNOSIS — I110 Hypertensive heart disease with heart failure: Secondary | ICD-10-CM | POA: Diagnosis not present

## 2023-04-20 DIAGNOSIS — Z7982 Long term (current) use of aspirin: Secondary | ICD-10-CM | POA: Insufficient documentation

## 2023-04-20 DIAGNOSIS — D72829 Elevated white blood cell count, unspecified: Secondary | ICD-10-CM | POA: Insufficient documentation

## 2023-04-20 DIAGNOSIS — Z79899 Other long term (current) drug therapy: Secondary | ICD-10-CM | POA: Diagnosis not present

## 2023-04-20 DIAGNOSIS — Z794 Long term (current) use of insulin: Secondary | ICD-10-CM | POA: Diagnosis not present

## 2023-04-20 DIAGNOSIS — I2510 Atherosclerotic heart disease of native coronary artery without angina pectoris: Secondary | ICD-10-CM | POA: Diagnosis not present

## 2023-04-20 DIAGNOSIS — I509 Heart failure, unspecified: Secondary | ICD-10-CM | POA: Diagnosis not present

## 2023-04-20 DIAGNOSIS — R519 Headache, unspecified: Secondary | ICD-10-CM | POA: Diagnosis not present

## 2023-04-20 LAB — CBC
HCT: 40.6 % (ref 39.0–52.0)
Hemoglobin: 13.1 g/dL (ref 13.0–17.0)
MCH: 31.3 pg (ref 26.0–34.0)
MCHC: 32.3 g/dL (ref 30.0–36.0)
MCV: 96.9 fL (ref 80.0–100.0)
Platelets: 155 10*3/uL (ref 150–400)
RBC: 4.19 MIL/uL — ABNORMAL LOW (ref 4.22–5.81)
RDW: 12.8 % (ref 11.5–15.5)
WBC: 14.8 10*3/uL — ABNORMAL HIGH (ref 4.0–10.5)
nRBC: 0 % (ref 0.0–0.2)

## 2023-04-20 LAB — CBG MONITORING, ED
Glucose-Capillary: 108 mg/dL — ABNORMAL HIGH (ref 70–99)
Glucose-Capillary: 98 mg/dL (ref 70–99)
Glucose-Capillary: 113 mg/dL — ABNORMAL HIGH (ref 70–99)

## 2023-04-20 LAB — POCT FASTING CBG KUC MANUAL ENTRY
POCT Glucose (KUC): 55 mg/dL — AB (ref 70–99)
POCT Glucose (KUC): 49 mg/dL — AB (ref 70–99)
POCT Glucose (KUC): 90 mg/dL (ref 70–99)

## 2023-04-20 LAB — BASIC METABOLIC PANEL
Anion gap: 11 (ref 5–15)
BUN: 22 mg/dL (ref 8–23)
CO2: 22 mmol/L (ref 22–32)
Calcium: 9.1 mg/dL (ref 8.9–10.3)
Chloride: 106 mmol/L (ref 98–111)
Creatinine, Ser: 1.09 mg/dL (ref 0.61–1.24)
GFR, Estimated: >60 mL/min (ref >60)
Glucose, Bld: 114 mg/dL — ABNORMAL HIGH (ref 70–99)
Potassium: 3.8 mmol/L (ref 3.5–5.1)
Sodium: 139 mmol/L (ref 135–145)

## 2023-04-20 MED ORDER — GLUCAGON HCL RDNA (DIAGNOSTIC) 1 MG IJ SOLR
INTRAMUSCULAR | Status: AC
  Filled 2023-04-20: qty 1

## 2023-04-20 MED ORDER — GLUCAGON HCL RDNA (DIAGNOSTIC) 1 MG IJ SOLR
1.0000 mg | Freq: Once | INTRAMUSCULAR | Status: AC
  Administered 2023-04-20: 1 mg via INTRAMUSCULAR

## 2023-04-20 MED ORDER — GLUCOSE 40 % PO GEL
1.0000 | Freq: Once | ORAL | Status: AC
  Administered 2023-04-20: 31 g via ORAL

## 2023-04-20 MED ORDER — GLUCOSE 40 % PO GEL
ORAL | Status: AC
  Filled 2023-04-20: qty 1.21

## 2023-04-20 NOTE — ED Provider Triage Note (Signed)
 Emergency Medicine Provider Triage Evaluation Note  [name] [name] , a 89 y.o. male  was evaluated in triage.  Pt complains of low blood sugar, patient was experiencing some dizziness, weakness, went to urgent care, had hypoglycemia, was given some oral glucose and then had a repeat episode of hypoglycemia, sent to the ED due to concern for work repeated episodes of hypoglycemia, sugar 98 on arrival..  Review of Systems  Positive:  Negative:   Physical Exam  BP (!) 141/72 (BP Location: Right Arm)   Pulse 70   Temp 98.2 F (36.8 C)   Resp 18   Ht 6\' 1"  (1.854 m)   Wt 108.4 kg   SpO2 98%   BMI 31.53 kg/m  Gen:   Awake, no distress   Resp:  Normal effort  MSK:   Moves extremities without difficulty  Other:    Medical Decision Making  Medically screening exam initiated at 8:49 PM.  Appropriate orders placed.  Cloyce Cisse was informed that the remainder of the evaluation will be completed by another provider, this initial triage assessment does not replace that evaluation, and the importance of remaining in the ED until their evaluation is complete.  Workup initiated in triage    [place] 04/20/23 2051

## 2023-04-20 NOTE — Discharge Instructions (Addendum)
 It was a pleasure caring for you today in the emergency department.  Be sure to closely monitor your blood sugar.  Take your insulin as prescribed.  Please follow-up with your PCP in the next week.  Please return to the emergency department for any worsening or worrisome symptoms.

## 2023-04-20 NOTE — ED Provider Notes (Signed)
 [place] [facility] AT [facility] Provider Note   CSN: [id number] Arrival date & time: 04/20/23  2012     History  Chief Complaint  Patient presents with   Hypoglycemia    [name] [name] is a 89 y.o. male Pt complains of low blood sugar, patient was experiencing some dizziness, weakness, went to urgent care, had hypoglycemia, was given some oral glucose and then had a repeat episode of hypoglycemia, sent to the ED due to concern for work repeated episodes of hypoglycemia, sugar 98 on arrival..    Hypoglycemia      Home Medications Prior to Admission medications   Medication Sig Start Date End Date Taking? Authorizing Provider  allopurinol (ZYLOPRIM) 100 MG tablet Take 2 tablets by mouth once daily 12/10/21   Inez Catalina, MD  amLODipine (NORVASC) 2.5 MG tablet Take 1 tablet (2.5 mg total) by mouth daily. 12/09/22   Sharlene Dory, PA-C  aspirin EC 81 MG tablet Take 81 mg by mouth as needed. Swallow whole.    [provider]  atorvastatin (LIPITOR) 40 MG tablet Take 1 tablet (40 mg total) by mouth daily. 07/29/22   Nahser, Deloris Ping, MD  benzonatate (TESSALON) 100 MG capsule Take 1 capsule (100 mg total) by mouth every 8 (eight) hours. 10/06/22   White, Elizabeth A, PA-C  blood glucose meter kit and supplies KIT Dispense based on patient and insurance preference. Use up to four times daily as directed. 11/08/21   Adron Bene, MD  cetirizine (ZYRTEC ALLERGY) 10 MG tablet Take 0.5 tablets (5 mg total) by mouth daily. Patient taking differently: Take 5 mg by mouth as needed for allergies. 10/06/22 12/09/22  Landis Martins, PA-C  colchicine 0.6 MG tablet Take 1 tablet (0.6 mg total) by mouth 2 (two) times daily as needed (gout flare). 09/23/21   Inez Catalina, MD  furosemide (LASIX) 20 MG tablet Take 1 tablet (20 mg total) by mouth as needed (SOB or wt gain). 12/09/22   Sharlene Dory, PA-C  gabapentin (NEURONTIN) 100 MG capsule Take 100 mg by  mouth 2 (two) times daily. Pt takes 2 tablet 200 mg bid    [provider]  insulin glargine (LANTUS) 100 UNIT/ML injection Inject 0.12 mLs (12 Units total) into the skin daily. 12/11/22   Thapa, [place], MD  insulin lispro (HUMALOG) 100 UNIT/ML injection INJECT 5 UNITS INTO THE SKIN 3 TIMES PER DAY BEFORE EACH MEAL 12/12/22   Thapa, [place], MD  losartan (COZAAR) 25 MG tablet Take 1 tablet (25 mg total) by mouth daily. 12/09/22   Sharlene Dory, PA-C  metFORMIN (GLUCOPHAGE) 500 MG tablet Take 2 tablets in the morning and 1 tablet in the evening. 03/16/23   Thapa, [place], MD  metoprolol succinate (TOPROL XL) 50 MG 24 hr tablet Take 1 tablet (50 mg total) by mouth daily. Take with or immediately following a meal. 12/09/22   Sharlene Dory, PA-C  metoprolol tartrate (LOPRESSOR) 25 MG tablet Take 2 tablets (50 mg total) by mouth 2 (two) times daily. 09/18/21   Gwenevere Abbot, MD  nitroGLYCERIN (NITROSTAT) 0.4 MG SL tablet Place 1 tablet (0.4 mg total) under the tongue every 5 (five) minutes as needed for chest pain. 09/09/22   Sharlene Dory, PA-C  pantoprazole (PROTONIX) 20 MG tablet Take 20 mg by mouth as needed for heartburn.    [provider]  Push Button Safety Lancets 28G MISC 1 Lancet by Does not apply route 3 (three)  times daily. 10/15/22   Thapa, [place], MD  tamsulosin (FLOMAX) 0.4 MG CAPS capsule Take 1 capsule (0.4 mg total) by mouth daily. 09/14/20   Inez Catalina, MD  trospium (SANCTURA) 20 MG tablet Take 20 mg by mouth as needed. 05/24/21   [provider]      Allergies    Patient has no known allergies.    Review of Systems   Review of Systems  All other systems reviewed and are negative.   Physical Exam Updated Vital Signs BP (!) 141/72 (BP Location: Right Arm)   Pulse 70   Temp 98.2 F (36.8 C)   Resp 18   Ht 6\' 1"  (1.854 m)   Wt 108.4 kg   SpO2 98%   BMI 31.53 kg/m  Physical Exam Vitals and nursing note reviewed.  Constitutional:      General: He is not  in acute distress.    Appearance: Normal appearance.  HENT:     Head: Normocephalic and atraumatic.  Eyes:     General:        Right eye: No discharge.        Left eye: No discharge.  Cardiovascular:     Rate and Rhythm: Normal rate and regular rhythm.     Heart sounds: No murmur heard.    No friction rub. No gallop.  Pulmonary:     Effort: Pulmonary effort is normal.     Breath sounds: Normal breath sounds.  Abdominal:     General: Bowel sounds are normal.     Palpations: Abdomen is soft.  Skin:    General: Skin is warm and dry.     Capillary Refill: Capillary refill takes less than 2 seconds.  Neurological:     Mental Status: He is alert and oriented to person, place, and time.     Comments: Cranial nerves II through XII grossly intact.  Intact finger-nose, intact heel-to-shin.  Romberg negative, gait normal.  Alert and oriented x3.  Moves all 4 limbs spontaneously, normal coordination.  No pronator drift.  Intact strength 5 out of 5 bilateral upper and lower extremities.    Psychiatric:        Mood and Affect: Mood normal.        Behavior: Behavior normal.     ED Results / Procedures / Treatments   Labs (all labs ordered are listed, but only abnormal results are displayed) Labs Reviewed  CBC - Abnormal; Notable for the following components:      Result Value   WBC 14.8 (*)    RBC 4.19 (*)    All other components within normal limits  CBG MONITORING, ED - Abnormal; Notable for the following components:   Glucose-Capillary 108 (*)    All other components within normal limits  BASIC METABOLIC PANEL  CBG MONITORING, ED    EKG None  Radiology No results found.  Procedures Procedures    Medications Ordered in ED Medications - No data to display  ED Course/ Medical Decision Making/ A&P                                 Medical Decision Making Amount and/or Complexity of Data Reviewed Labs: ordered.   This patient is a 89 y.o. male  who presents to the  ED for concern of weakness, dizziness, hypoglycemia.   Differential diagnoses prior to evaluation: The emergent differential diagnosis includes, but is not limited to,  CVA, spinal cord injury, ACS, arrhythmia, syncope, orthostatic hypotension, sepsis, hypoglycemia, hypoxia, electrolyte disturbance, endocrine disorder, anemia, environmental exposure, polypharmacy . This is not an exhaustive differential.   Past Medical History / Co-morbidities / Social History: Hypertension, hyperlipidemia, CAD, peripheral arterial disease, CHF  Additional history: Chart reviewed. Pertinent results include: Reviewed the lab work, evaluation urgent care just prior to arrival  Physical Exam: Physical exam performed. The pertinent findings include: Patient is well-appearing, vital signs overall stable other than mild hypertension, blood pressure 141/72  Lab Tests/Imaging studies: I personally interpreted labs/imaging and the pertinent results include: Repeat CBG was improved after being monitored in the ED, 108.  It has been stable/slightly improving since arrival.  We will recheck 1 more time at 1030.  CBC with mild leukocytosis, suspect mild dehydration, white blood cell 14.8, no other infectious symptoms reported by patient.  BMP pending at time of handoff I agree with the radiologist interpretation.   Medications: We did not administer any additional glucose but patient received 2 rounds of glucose at urgent care prior to discharge, we did make sure that patient was able to tolerate eating something and he was able to tolerate eating something without difficulty, no nausea, vomiting   Disposition: After consideration of the diagnostic results and the patients response to treatment, I feel that patient with low blood sugar which I think is the cause of his dizziness, he is not having any focal neurologic deficits, he feels 100% improved from his earlier symptoms.[name]   9:52 PM Care of [facility]  transferred to Dr. Wallace Cullens at the end of my shift as the patient will require reassessment once labs/imaging have resulted. Patient presentation, ED course, and plan of care discussed with review of all pertinent labs and imaging. Please see his/her note for further details regarding further ED course and disposition. Plan at time of handoff is follow-up BMP, reassess CBG x 1, if continuing to feel improved, CBG stable to improved I think stable for discharge at this time, encouraged him to hold insulin until tomorrow and make sure that he is eating whenever taking insulin. This may be altered or completely changed at the discretion of the oncoming team pending results of further workup.   Final Clinical Impression(s) / ED Diagnoses Final diagnoses:  None    Rx / DC Orders ED Discharge Orders     None         Olene Floss, PA-C 04/20/23 2152    Tanda Rockers A, DO 04/21/23 0038

## 2023-04-20 NOTE — ED Notes (Signed)
 Patient is being discharged from the Urgent Care and sent to the Emergency Department via pov . Per Dr Marlinda Mike, patient is in need of higher level of care due to hypoglycemia. Patient is aware and verbalizes understanding of plan of care.  Vitals:   04/20/23 1925  BP: (!) 170/87  Pulse: 83  Resp: 16  Temp: 99 F (37.2 C)  SpO2: 98%

## 2023-04-20 NOTE — ED Triage Notes (Signed)
 Patient reports dizziness and headache that started today. Patient thinks he took to much insulin today. Provider called to triage. CBG-55

## 2023-04-20 NOTE — ED Triage Notes (Addendum)
 Patient seen at [facility] for dizziness and was found to have cbg of 49. Glucose gel and glucagon IM given at UC. Repeat cbg of 90 before leaving urgent care. Daughter reports patient possibly took too much of his insulin. Patient denies dizziness at this time.

## 2023-04-20 NOTE — ED Triage Notes (Signed)
 Denies any recent vomiting or diarrhea.

## 2023-04-20 NOTE — ED Provider Notes (Signed)
 [facility]    CSN: [id number] Arrival date & time: 04/20/23  1841      History   Chief Complaint Chief Complaint  Patient presents with   Dizziness   Headache    HPI [name] [name] is a 89 y.o. male.    Dizziness Associated symptoms: headaches   Headache Associated symptoms: dizziness   Here for feeling off balance, low sugar, and headache.  He had let his daughter know when he was feeling this way earlier today.  She came over and checked his sugar and it was 141, that was actually after eating some vegetables.  His blood pressure at the time was normal.  His balance seemed improved and she left and then returned to check on him about an hour and a half ago.  He had taken his evening medicine and had not eaten anything.  He once again felt off balance . He states that the minute he doses of insulin he started having a headache.  No fever or cough or abdominal pain or nausea or vomiting or diarrhea.  NKDA   Past Medical History:  Diagnosis Date   Benign prostatic hyperplasia (BPH) with urinary urge incontinence 11/13/2017   Chronic left shoulder pain 11/13/2017   Chronic non-seasonal allergic rhinitis 02/17/2006   Chronic systolic heart failure (HCC) 10/31/2016   Echo (05/03/2015): LVEF 45-50%, grade 2 diastolic dysfunction   Coronary artery disease involving native coronary artery of native heart without angina pectoris 09/30/2007   s/p CABG on 01/05/2013: left internal mammary artery to left anterior descending, saphenous vein graft to obtuse marginal 1, sequential saphenous vein graft to acute marginal and posterior descending   Essential hypertension 11/04/2005   Gastroesophageal reflux disease with hiatal hernia 11/04/2005   History of prostate cancer 02/17/2006   Diagnosed 1995, s/p seed implantation in the late 1990's, PSA undetectable 04/10/2016   Hyperlipidemia 11/04/2005   Obesity (BMI 30.0-34.9) 11/13/2017   Peripheral arterial disease (HCC)  09/30/2007   with claudication, failed attempt at LLE PTCA   Type 2 diabetes mellitus with microalbuminuria, with long-term current use of insulin (HCC) 11/13/2017   Type 2 diabetes mellitus with mild 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, bilateral (HCC) 03/24/2018   Type 2 diabetes mellitus with peripheral neuropathy (HCC) 11/04/2005   Urethral stricture in male 11/13/2017   Noted on cystoscopy 12/18/2014.  Reportedly asked to do chronic intermittent self catheterizations, but has not.    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) 12/19/2020   Mild cognitive impairment 06/25/2020   Esophageal dysphagia 04/24/2020   Chronic gout 05/27/2018   Constipation 03/26/2018   Type 2 diabetes mellitus with mild 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, bilateral (HCC) 03/24/2018   Healthcare maintenance 11/13/2017   Chronic left shoulder pain 11/13/2017   Severe obesity (BMI 35.0-35.9 with comorbidity) (HCC) 11/13/2017   Urethral stricture in male 11/13/2017   Benign prostatic hyperplasia (BPH) with urinary urge incontinence 11/13/2017   Chronic combined systolic (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) 10/31/2016   Arthritis 12/19/2014   Coronary artery disease involving native coronary artery of native heart without angina pectoris 09/30/2007   Peripheral arterial disease (HCC) 09/30/2007   Chronic non-seasonal allergic rhinitis 02/17/2006   History of prostate cancer 02/17/2006   Hyperlipidemia 11/04/2005   Essential hypertension 11/04/2005   Gastroesophageal reflux disease  with hiatal hernia 11/04/2005    Past Surgical History:  Procedure Laterality Date   BREAST SURGERY     CARDIAC CATHETERIZATION  10/05/2008   REVEALS HYPOKINESIS OF THE LATERAL WALL AND EF 50-55%    CORONARY ANGIOPLASTY WITH STENT PLACEMENT     CORONARY ARTERY BYPASS GRAFT N/A 01/05/2013   Procedure: CORONARY ARTERY BYPASS GRAFTING (CABG);  Surgeon: Loreli Slot, MD;  Location: [facility] OR;  Service: Open Heart Surgery;  Laterality: N/A;   INTRAOPERATIVE TRANSESOPHAGEAL ECHOCARDIOGRAM N/A 01/05/2013   Procedure: INTRAOPERATIVE TRANSESOPHAGEAL ECHOCARDIOGRAM;  Surgeon: Loreli Slot, MD;  Location: [facility] OR;  Service: Open Heart Surgery;  Laterality: N/A;   LEFT HEART CATHETERIZATION WITH CORONARY ANGIOGRAM N/A 01/03/2013   Procedure: LEFT HEART CATHETERIZATION WITH CORONARY ANGIOGRAM;  Surgeon: [name], MD;  Location: [facility] CATH LAB;  Service: Cardiovascular;  Laterality: N/A;   LOWER EXTREMITY ANGIOGRAM Right 12/07/2012   unsuccessful attempt at percutaneous revascularization of a calcified long segment chronic total occlusion mid left SFA /notes 12/07/2012   LOWER EXTREMITY ANGIOGRAM N/A 12/07/2012   Procedure: LOWER EXTREMITY ANGIOGRAM;  Surgeon: [name], MD;  Location: [facility] CATH LAB;  Service: Cardiovascular;  Laterality: N/A;   PROSTATE SURGERY  10/04/1988       Home Medications    Prior to Admission medications   Medication Sig Start Date End Date Taking? Authorizing Provider  allopurinol (ZYLOPRIM) 100 MG tablet Take 2 tablets by mouth once daily 12/10/21  Yes Debe Coder B, MD  amLODipine (NORVASC) 2.5 MG tablet Take 1 tablet (2.5 mg total) by mouth daily. 12/09/22  Yes Sharlene Dory, PA-C  aspirin EC 81 MG tablet Take 81 mg by mouth as needed. Swallow whole.   Yes [provider]  atorvastatin (LIPITOR) 40 MG tablet Take 1 tablet (40 mg total) by mouth daily. 07/29/22  Yes Nahser, Deloris Ping, MD  blood glucose meter kit and supplies KIT Dispense based on patient and insurance preference. Use up to four times daily as directed. 11/08/21  Yes Adron Bene, MD  colchicine 0.6 MG tablet Take 1 tablet (0.6 mg total) by mouth 2 (two) times daily as  needed (gout flare). 09/23/21  Yes Inez Catalina, MD  furosemide (LASIX) 20 MG tablet Take 1 tablet (20 mg total) by mouth as needed (SOB or wt gain). 12/09/22  Yes Conte, Tessa N, PA-C  gabapentin (NEURONTIN) 100 MG capsule Take 100 mg by mouth 2 (two) times daily. Pt takes 2 tablet 200 mg bid   Yes [provider]  insulin glargine (LANTUS) 100 UNIT/ML injection Inject 0.12 mLs (12 Units total) into the skin daily. 12/11/22  Yes Thapa, [place], MD  insulin lispro (HUMALOG) 100 UNIT/ML injection INJECT 5 UNITS INTO THE SKIN 3 TIMES PER DAY BEFORE EACH MEAL 12/12/22  Yes Thapa, [place], MD  losartan (COZAAR) 25 MG tablet Take 1 tablet (25 mg total) by mouth daily. 12/09/22  Yes Conte, Tessa N, PA-C  metFORMIN (GLUCOPHAGE) 500 MG tablet Take 2 tablets in the morning and 1 tablet in the evening. 03/16/23  Yes Thapa, [place], MD  metoprolol succinate (TOPROL XL) 50 MG 24 hr tablet Take 1 tablet (50 mg total) by mouth daily. Take with or immediately following a meal. 12/09/22  Yes Conte, Tessa N, PA-C  pantoprazole (PROTONIX) 20 MG tablet Take 20 mg by mouth as needed for heartburn.   Yes [provider]  Push Button Safety Lancets 28G MISC 1 Lancet by Does not apply route 3 (three)  times daily. 10/15/22  Yes Thapa, [place], MD  tamsulosin (FLOMAX) 0.4 MG CAPS capsule Take 1 capsule (0.4 mg total) by mouth daily. 09/14/20  Yes Inez Catalina, MD  trospium (SANCTURA) 20 MG tablet Take 20 mg by mouth as needed. 05/24/21  Yes [provider]  benzonatate (TESSALON) 100 MG capsule Take 1 capsule (100 mg total) by mouth every 8 (eight) hours. 10/06/22   White, Elizabeth A, PA-C  cetirizine (ZYRTEC ALLERGY) 10 MG tablet Take 0.5 tablets (5 mg total) by mouth daily. Patient taking differently: Take 5 mg by mouth as needed for allergies. 10/06/22 12/09/22  Doristine Mango A, PA-C  metoprolol tartrate (LOPRESSOR) 25 MG tablet Take 2 tablets (50 mg total) by mouth 2 (two) times daily. 09/18/21   Gwenevere Abbot, MD  nitroGLYCERIN (NITROSTAT) 0.4 MG SL tablet Place 1 tablet (0.4 mg total) under the tongue every 5 (five) minutes as needed for chest pain. 09/09/22   Sharlene Dory, PA-C    Family History Family History  Problem Relation Age of Onset   Kidney failure Mother    Hypertension Father    Stroke Father    Other Sister    Other Brother    Other Brother    Other Brother    Other Brother    Other Brother    Other Brother    Marine scientist    Healthy Daughter    Healthy Daughter    Healthy Son     Social History Social History   Tobacco Use   Smoking status: Former    Current packs/day: 0.00    Average packs/day: 0.8 packs/day for 20.0 years (15.0 ttl pk-yrs)    Types: Cigarettes    Start date: 07/08/1960    Quit date: 07/08/1980    Years since quitting: 42.8   Smokeless tobacco: Never  Vaping Use   Vaping status: Never Used  Substance Use Topics   Alcohol use: No    Alcohol/week: 0.0 standard drinks of alcohol    Comment: 12/07/2012 "quit drinking alcohol > 25 yr ago"   Drug use: No     Allergies   Patient has no known allergies.   Review of Systems Review of Systems  Neurological:  Positive for dizziness and headaches.     Physical Exam Triage Vital Signs ED Triage Vitals [04/20/23 1925]  Encounter Vitals Group     BP (!) 170/87     Systolic BP Percentile      Diastolic BP Percentile      Pulse Rate 83     Resp 16     Temp 99 F (37.2 C)     Temp Source Oral     SpO2 98 %     Weight      Height      Head Circumference      Peak Flow      Pain Score      Pain Loc      Pain Education      Exclude from Growth Chart    No data found.  Updated Vital Signs BP (!) 170/87 (BP Location: Left Arm)   Pulse 83   Temp 99 F (37.2 C) (Oral)   Resp 16   SpO2 98%   Visual Acuity Right Eye Distance:   Left Eye Distance:   Bilateral Distance:    Right Eye Near:   Left Eye Near:    Bilateral Near:  Physical Exam Vitals reviewed.  Constitutional:      General: He is not in acute distress.    Appearance: He is not ill-appearing, toxic-appearing or diaphoretic.  HENT:     Nose: Nose normal.     Mouth/Throat:     Mouth: Mucous membranes are moist.     Pharynx: No oropharyngeal exudate or posterior oropharyngeal erythema.  Eyes:     Extraocular Movements: Extraocular movements intact.     Conjunctiva/sclera: Conjunctivae normal.     Pupils: Pupils are equal, round, and reactive to light.  Cardiovascular:     Rate and Rhythm: Normal rate and regular rhythm.     Heart sounds: No murmur heard. Pulmonary:     Effort: Pulmonary effort is normal. No respiratory distress.     Breath sounds: Normal breath sounds. No stridor. No wheezing, rhonchi or rales.  Chest:     Chest wall: No tenderness.  Musculoskeletal:     Cervical back: Neck supple.     Comments: Randie Heinz may be a little weaker in the right hand than in the left.  Lymphadenopathy:     Cervical: No cervical adenopathy.  Skin:    Capillary Refill: Capillary refill takes less than 2 seconds.     Coloration: Skin is not jaundiced or pale.  Neurological:     General: No focal deficit present.     Mental Status: He is alert and oriented to person, place, and time.  Psychiatric:        Behavior: Behavior normal.      UC Treatments / Results  Labs (all labs ordered are listed, but only abnormal results are displayed) Labs Reviewed  POCT FASTING CBG [facility] MANUAL ENTRY - Abnormal; Notable for the following components:      Result Value   POCT Glucose ([facility]) 55 (*)    All other components within normal limits  POCT FASTING CBG [facility] MANUAL ENTRY - Abnormal; Notable for the following components:   POCT Glucose ([facility]) 49 (*)    All other components within normal limits  POCT FASTING CBG [facility] MANUAL ENTRY - Normal    EKG   Radiology No results found.  Procedures Procedures (including critical care time)  Medications  Ordered in UC Medications  dextrose (GLUTOSE) oral gel 40% (peds > 20kg and adults) (has no administration in time range)  glucagon (human recombinant) (GLUCAGEN) injection 1 mg (1 mg Intramuscular Given 04/20/23 1940)    Initial Impression / Assessment and Plan / UC Course  I have reviewed the triage vital signs and the nursing notes.  Pertinent labs & imaging results that were available during my care of the patient were reviewed by me and considered in my medical decision making (see chart for details).     Initial fingerstick glucose is 55.  He was given glucose gel and his sugar went to 49.  Glucagon 1 mg was given IM. His sugar then went to 90.  He continues to be alert.  His daughter is going to take him back private vehicle to the emergency room for further evaluation of his symptoms.  I am concerned that his sugars can go back down, and I am concerned he might have dosed too much insulin earlier today. Final Clinical Impressions(s) / UC Diagnoses   Final diagnoses:  Hypoglycemia  Nonintractable headache, unspecified chronicity pattern, unspecified headache type     Discharge Instructions      Initially sugar was 55, and then after glucose gel it was 49.  Glucagon  IM was given and sugar went to 90.  Daughter and son-in-law are going to take him to the emergency room for further evaluation and treatment.     ED Prescriptions   None    PDMP not reviewed this encounter.   Zenia Resides, MD 04/20/23 [phone]

## 2023-04-20 NOTE — Discharge Instructions (Signed)
 Initially sugar was 55, and then after glucose gel it was 49.  Glucagon IM was given and sugar went to 90.  Daughter and son-in-law are going to take him to the emergency room for further evaluation and treatment.

## 2023-04-20 NOTE — ED Notes (Signed)
 DR Marlinda Mike aware of CBG 90. In room to eval pt and sent PT to ED with Family.

## 2023-04-29 ENCOUNTER — Encounter (HOSPITAL_COMMUNITY): Payer: Self-pay

## 2023-04-29 ENCOUNTER — Other Ambulatory Visit: Payer: Self-pay

## 2023-04-29 ENCOUNTER — Emergency Department (HOSPITAL_COMMUNITY)
Admission: EM | Admit: 2023-04-29 | Discharge: 2023-04-29 | Disposition: A | Discharge status: Home / Self Care | Attending: Emergency Medicine | Admitting: Emergency Medicine

## 2023-04-29 ENCOUNTER — Emergency Department (HOSPITAL_COMMUNITY)

## 2023-04-29 DIAGNOSIS — Z7982 Long term (current) use of aspirin: Secondary | ICD-10-CM | POA: Insufficient documentation

## 2023-04-29 DIAGNOSIS — Z8673 Personal history of transient ischemic attack (TIA), and cerebral infarction without residual deficits: Secondary | ICD-10-CM | POA: Diagnosis not present

## 2023-04-29 DIAGNOSIS — Z794 Long term (current) use of insulin: Secondary | ICD-10-CM | POA: Insufficient documentation

## 2023-04-29 DIAGNOSIS — N3000 Acute cystitis without hematuria: Secondary | ICD-10-CM | POA: Insufficient documentation

## 2023-04-29 DIAGNOSIS — R42 Dizziness and giddiness: Secondary | ICD-10-CM | POA: Insufficient documentation

## 2023-04-29 DIAGNOSIS — E119 Type 2 diabetes mellitus without complications: Secondary | ICD-10-CM | POA: Insufficient documentation

## 2023-04-29 DIAGNOSIS — I6782 Cerebral ischemia: Secondary | ICD-10-CM | POA: Insufficient documentation

## 2023-04-29 DIAGNOSIS — R519 Headache, unspecified: Secondary | ICD-10-CM | POA: Diagnosis not present

## 2023-04-29 DIAGNOSIS — Z7984 Long term (current) use of oral hypoglycemic drugs: Secondary | ICD-10-CM | POA: Insufficient documentation

## 2023-04-29 DIAGNOSIS — I10 Essential (primary) hypertension: Secondary | ICD-10-CM | POA: Insufficient documentation

## 2023-04-29 DIAGNOSIS — Z79899 Other long term (current) drug therapy: Secondary | ICD-10-CM | POA: Insufficient documentation

## 2023-04-29 DIAGNOSIS — I2510 Atherosclerotic heart disease of native coronary artery without angina pectoris: Secondary | ICD-10-CM | POA: Insufficient documentation

## 2023-04-29 DIAGNOSIS — Z8546 Personal history of malignant neoplasm of prostate: Secondary | ICD-10-CM | POA: Insufficient documentation

## 2023-04-29 LAB — PROTIME-INR
INR: 1.1 (ref 0.8–1.2)
Prothrombin Time: 14.5 s (ref 11.4–15.2)

## 2023-04-29 LAB — URINALYSIS, ROUTINE W REFLEX MICROSCOPIC
Bilirubin Urine: NEGATIVE
Glucose, UA: 150 mg/dL — AB
Ketones, ur: NEGATIVE mg/dL
Nitrite: NEGATIVE
Protein, ur: 100 mg/dL — AB
Specific Gravity, Urine: 1.014 (ref 1.005–1.030)
WBC, UA: >50 WBC/hpf (ref 0–5)
pH: 5 (ref 5.0–8.0)

## 2023-04-29 LAB — DIFFERENTIAL
Abs Immature Granulocytes: 0.05 10*3/uL (ref 0.00–0.07)
Basophils Absolute: 0 10*3/uL (ref 0.0–0.1)
Basophils Relative: 0 %
Eosinophils Absolute: 0.3 10*3/uL (ref 0.0–0.5)
Eosinophils Relative: 3 %
Immature Granulocytes: 1 %
Lymphocytes Relative: 21 %
Lymphs Abs: 2 10*3/uL (ref 0.7–4.0)
Monocytes Absolute: 0.6 10*3/uL (ref 0.1–1.0)
Monocytes Relative: 7 %
Neutro Abs: 6.2 10*3/uL (ref 1.7–7.7)
Neutrophils Relative %: 68 %

## 2023-04-29 LAB — COMPREHENSIVE METABOLIC PANEL
ALT: 16 U/L (ref 0–44)
AST: 19 U/L (ref 15–41)
Albumin: 3.2 g/dL — ABNORMAL LOW (ref 3.5–5.0)
Alkaline Phosphatase: 97 U/L (ref 38–126)
Anion gap: 9 (ref 5–15)
BUN: 23 mg/dL (ref 8–23)
CO2: 23 mmol/L (ref 22–32)
Calcium: 9.1 mg/dL (ref 8.9–10.3)
Chloride: 107 mmol/L (ref 98–111)
Creatinine, Ser: 1.22 mg/dL (ref 0.61–1.24)
GFR, Estimated: 57 mL/min — ABNORMAL LOW (ref >60)
Glucose, Bld: 276 mg/dL — ABNORMAL HIGH (ref 70–99)
Potassium: 4.5 mmol/L (ref 3.5–5.1)
Sodium: 139 mmol/L (ref 135–145)
Total Bilirubin: 0.8 mg/dL (ref 0.0–1.2)
Total Protein: 6.6 g/dL (ref 6.5–8.1)

## 2023-04-29 LAB — CBC
HCT: 38 % — ABNORMAL LOW (ref 39.0–52.0)
Hemoglobin: 12.4 g/dL — ABNORMAL LOW (ref 13.0–17.0)
MCH: 31.5 pg (ref 26.0–34.0)
MCHC: 32.6 g/dL (ref 30.0–36.0)
MCV: 96.4 fL (ref 80.0–100.0)
Platelets: 164 10*3/uL (ref 150–400)
RBC: 3.94 MIL/uL — ABNORMAL LOW (ref 4.22–5.81)
RDW: 12.9 % (ref 11.5–15.5)
WBC: 9.2 10*3/uL (ref 4.0–10.5)
nRBC: 0 % (ref 0.0–0.2)

## 2023-04-29 LAB — RAPID URINE DRUG SCREEN, HOSP PERFORMED
Amphetamines: NOT DETECTED
Barbiturates: NOT DETECTED
Benzodiazepines: NOT DETECTED
Cocaine: NOT DETECTED
Opiates: NOT DETECTED
Tetrahydrocannabinol: NOT DETECTED

## 2023-04-29 LAB — I-STAT CG4 LACTIC ACID, ED: Lactic Acid, Venous: 1.4 mmol/L (ref 0.5–1.9)

## 2023-04-29 LAB — ETHANOL: Alcohol, Ethyl (B): <10 mg/dL (ref <10)

## 2023-04-29 LAB — CBG MONITORING, ED: Glucose-Capillary: 245 mg/dL — ABNORMAL HIGH (ref 70–99)

## 2023-04-29 LAB — APTT: aPTT: 33 s (ref 24–36)

## 2023-04-29 MED ORDER — CEPHALEXIN 500 MG PO CAPS: 500.0000 mg | ORAL_CAPSULE | Freq: Three times a day (TID) | ORAL | 0 refills | Status: DC

## 2023-04-29 MED ORDER — SODIUM CHLORIDE 0.9 % IV SOLN
1.0000 g | Freq: Once | INTRAVENOUS | Status: AC
  Administered 2023-04-29: 1 g via INTRAVENOUS
  Filled 2023-04-29: qty 10

## 2023-04-29 NOTE — ED Notes (Signed)
 Called pts family will be enroute to pick him up.

## 2023-04-29 NOTE — ED Triage Notes (Signed)
 Pt bibgcems from home. Pt had a fall this evening from dizziness x3 days. Pt has no c/o pain or hitting his head, no LOC.  Bp 138/84 Hr 70 98% RA  Cbg 259

## 2023-04-29 NOTE — ED Notes (Signed)
 Patient transported to MRI

## 2023-04-29 NOTE — ED Notes (Signed)
 Patient ambulated in hall with no issues, uses cane while at home and ambulated ok in hall without. No dizziness or unsteadiness while ambulating. MD Horton advised.

## 2023-04-29 NOTE — ED Notes (Signed)
 Pt transported back to MRI by this nurse.

## 2023-04-29 NOTE — Discharge Instructions (Signed)
 You were seen today for dizziness and a fall.  You do have evidence of urinary tract infection.  Take antibiotics as prescribed.  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  This can sometimes cause dizziness.  Follow-up with neurology.  A referral has been placed.

## 2023-04-29 NOTE — ED Provider Notes (Signed)
 [place] EMERGENCY DEPARTMENT AT [facility] Provider Note   CSN: [id number] Arrival date & time: 04/29/23  0129     History  Chief Complaint  Patient presents with   Dizziness    [name] [name] is a 89 y.o. male.  HPI     This is an 89 year old male who presents with dizziness and fall.  Daughter at bedside provides some of the history.  Reports that she had a phone call tonight that he felt dizzy and had fallen.  Patient reports that he did not hit his head or lose consciousness.  He does not describe room spinning dizziness.  Symptoms are worse when he changes position or tries to get up.  He states that if he were to stand up he would fall forward immediately.  Reports associated headache.  He states that his symptoms are after he takes all of his medications.  He states that he takes them all at the same time so he is unsure which medication is causing his symptoms.  He denies chest pain or shortness of breath.  Denies any weakness or numbness.  Does not have any pain.  Of note, was evaluated last week for episodes of hypoglycemia.  Home Medications Prior to Admission medications   Medication Sig Start Date End Date Taking? Authorizing Provider  cephALEXin (KEFLEX) 500 MG capsule Take 1 capsule (500 mg total) by mouth 3 (three) times daily. 04/29/23  Yes Relda Agosto, Mayer Masker, MD  allopurinol (ZYLOPRIM) 100 MG tablet Take 2 tablets by mouth once daily 12/10/21   Inez Catalina, MD  amLODipine (NORVASC) 2.5 MG tablet Take 1 tablet (2.5 mg total) by mouth daily. 12/09/22   Sharlene Dory, PA-C  aspirin EC 81 MG tablet Take 81 mg by mouth as needed. Swallow whole.    [provider]  atorvastatin (LIPITOR) 40 MG tablet Take 1 tablet (40 mg total) by mouth daily. 07/29/22   Nahser, Deloris Ping, MD  benzonatate (TESSALON) 100 MG capsule Take 1 capsule (100 mg total) by mouth every 8 (eight) hours. 10/06/22   White, Elizabeth A, PA-C  blood glucose meter kit and supplies  KIT Dispense based on patient and insurance preference. Use up to four times daily as directed. 11/08/21   Adron Bene, MD  cetirizine (ZYRTEC ALLERGY) 10 MG tablet Take 0.5 tablets (5 mg total) by mouth daily. Patient taking differently: Take 5 mg by mouth as needed for allergies. 10/06/22 12/09/22  Landis Martins, PA-C  colchicine 0.6 MG tablet Take 1 tablet (0.6 mg total) by mouth 2 (two) times daily as needed (gout flare). 09/23/21   Inez Catalina, MD  furosemide (LASIX) 20 MG tablet Take 1 tablet (20 mg total) by mouth as needed (SOB or wt gain). 12/09/22   Sharlene Dory, PA-C  gabapentin (NEURONTIN) 100 MG capsule Take 100 mg by mouth 2 (two) times daily. Pt takes 2 tablet 200 mg bid    [provider]  insulin glargine (LANTUS) 100 UNIT/ML injection Inject 0.12 mLs (12 Units total) into the skin daily. 12/11/22   Thapa, [place], MD  insulin lispro (HUMALOG) 100 UNIT/ML injection INJECT 5 UNITS INTO THE SKIN 3 TIMES PER DAY BEFORE EACH MEAL 12/12/22   Thapa, [place], MD  losartan (COZAAR) 25 MG tablet Take 1 tablet (25 mg total) by mouth daily. 12/09/22   Sharlene Dory, PA-C  metFORMIN (GLUCOPHAGE) 500 MG tablet Take 2 tablets in the morning and 1 tablet in the evening. 03/16/23  Thapa, [place], MD  metoprolol succinate (TOPROL XL) 50 MG 24 hr tablet Take 1 tablet (50 mg total) by mouth daily. Take with or immediately following a meal. 12/09/22   Sharlene Dory, PA-C  metoprolol tartrate (LOPRESSOR) 25 MG tablet Take 2 tablets (50 mg total) by mouth 2 (two) times daily. 09/18/21   Gwenevere Abbot, MD  nitroGLYCERIN (NITROSTAT) 0.4 MG SL tablet Place 1 tablet (0.4 mg total) under the tongue every 5 (five) minutes as needed for chest pain. 09/09/22   Sharlene Dory, PA-C  pantoprazole (PROTONIX) 20 MG tablet Take 20 mg by mouth as needed for heartburn.    [provider]  Push Button Safety Lancets 28G MISC 1 Lancet by Does not apply route 3 (three) times daily. 10/15/22   Thapa, [place], MD   tamsulosin (FLOMAX) 0.4 MG CAPS capsule Take 1 capsule (0.4 mg total) by mouth daily. 09/14/20   Inez Catalina, MD  trospium (SANCTURA) 20 MG tablet Take 20 mg by mouth as needed. 05/24/21   [provider]      Allergies    Patient has no known allergies.    Review of Systems   Review of Systems  Constitutional:  Negative for fever.  Respiratory:  Negative for shortness of breath.   Cardiovascular:  Negative for chest pain.  Neurological:  Positive for dizziness, light-headedness and headaches.  All other systems reviewed and are negative.   Physical Exam Updated Vital Signs BP (!) 160/100   Pulse 66   Temp 98 F (36.7 C) (Oral)   Resp (!) 22   SpO2 100%  Physical Exam Vitals and nursing note reviewed.  Constitutional:      Appearance: He is well-developed. He is obese.  HENT:     Head: Normocephalic and atraumatic.  Eyes:     Pupils: Pupils are equal, round, and reactive to light.  Cardiovascular:     Rate and Rhythm: Normal rate and regular rhythm.     Heart sounds: Normal heart sounds. No murmur heard. Pulmonary:     Effort: Pulmonary effort is normal. No respiratory distress.     Breath sounds: Normal breath sounds.  Abdominal:     Palpations: Abdomen is soft.     Tenderness: There is no abdominal tenderness. There is no rebound.  Musculoskeletal:     Cervical back: Neck supple.  Lymphadenopathy:     Cervical: No cervical adenopathy.  Skin:    General: Skin is warm and dry.  Neurological:     Mental Status: He is alert and oriented to person, place, and time.     Comments: Cranial nerves II through XII intact, 5 out of 5 strength in all 4 extremities, no dysmetria to finger-nose-finger     ED Results / Procedures / Treatments   Labs (all labs ordered are listed, but only abnormal results are displayed) Labs Reviewed  CBC - Abnormal; Notable for the following components:      Result Value   RBC 3.94 (*)    Hemoglobin 12.4 (*)    HCT 38.0 (*)     All other components within normal limits  COMPREHENSIVE METABOLIC PANEL - Abnormal; Notable for the following components:   Glucose, Bld 276 (*)    Albumin 3.2 (*)    GFR, Estimated 57 (*)    All other components within normal limits  URINALYSIS, ROUTINE W REFLEX MICROSCOPIC - Abnormal; Notable for the following components:   APPearance HAZY (*)    Glucose, UA 150 (*)  Hgb urine dipstick SMALL (*)    Protein, ur 100 (*)    Leukocytes,Ua MODERATE (*)    Bacteria, UA MANY (*)    All other components within normal limits  CBG MONITORING, ED - Abnormal; Notable for the following components:   Glucose-Capillary 245 (*)    All other components within normal limits  URINE CULTURE  ETHANOL  PROTIME-INR  APTT  DIFFERENTIAL  RAPID URINE DRUG SCREEN, HOSP PERFORMED  I-STAT CHEM 8, ED  I-STAT CG4 LACTIC ACID, ED    EKG EKG Interpretation Date/Time:  Wednesday April 29 2023 01:41:42 EDT Ventricular Rate:  66 PR Interval:  155 QRS Duration:  112 QT Interval:  410 QTC Calculation: 430 R Axis:   32  Text Interpretation: Sinus rhythm Atrial premature complex Borderline intraventricular conduction delay Nonspecific T abnormalities, lateral leads Borderline ST elevation, anterior leads Confirmed by Ross Marcus ([id number]) on 04/29/2023 2:52:44 AM  Radiology MR BRAIN WO CONTRAST Result Date: 04/29/2023 CLINICAL DATA:  Headache with neuro deficit EXAM: MRI HEAD WITHOUT CONTRAST TECHNIQUE: Multiplanar, multiecho pulse sequences of the brain and surrounding structures were obtained without intravenous contrast. COMPARISON:  Head CT from earlier today FINDINGS: Brain: No acute infarction, hemorrhage, hydrocephalus, extra-axial collection or mass lesion. Small chronic infarcts in the right cerebellum and superior left frontal cortex with mineralization at both levels. No generalized chronic hemorrhagic injury. There is generalized cerebral volume loss that is mild for age. Mild chronic small  vessel ischemia in the cerebral white matter. Vascular: Normal flow voids. Skull and upper cervical spine: Normal marrow signal. Degenerative facet spurring in the cervical spine. Sinuses/Orbits: Negative. IMPRESSION: Aging brain without acute or reversible finding. Electronically Signed   By: Tiburcio Pea M.D.   On: 04/29/2023 06:03   CT HEAD WO CONTRAST Result Date: 04/29/2023 CLINICAL DATA:  Neuro deficit, acute, stroke suspected EXAM: CT HEAD WITHOUT CONTRAST TECHNIQUE: Contiguous axial images were obtained from the base of the skull through the vertex without intravenous contrast. RADIATION DOSE REDUCTION: This exam was performed according to the departmental dose-optimization program which includes automated exposure control, adjustment of the mA and/or kV according to patient size and/or use of iterative reconstruction technique. COMPARISON:  None Available. FINDINGS: Brain: No evidence of acute infarction, hemorrhage, hydrocephalus, extra-axial collection or mass lesion/mass effect. Small remote right cerebellar infarct. Mild for age patchy white matter hypodensities, nonspecific but compatible with chronic microvascular ischemic disease. Vascular: Calcific atherosclerosis. No hyperdense vessel identified. Skull: No acute fracture. Sinuses/Orbits: Clear sinuses.  No acute orbital findings. Other: No mastoid effusions. IMPRESSION: 1. No evidence of acute intracranial abnormality. 2. Small remote right cerebellar infarct. Electronically Signed   By: Feliberto Harts M.D.   On: 04/29/2023 02:37    Procedures Procedures    Medications Ordered in ED Medications  cefTRIAXone (ROCEPHIN) 1 g in sodium chloride 0.9 % 100 mL IVPB (1 g Intravenous New Bag/Given 04/29/23 6045)    ED Course/ Medical Decision Making/ A&P Clinical Course as of 04/29/23 0635  Wed Apr 29, 2023  0524 Patient returned from MRI because he had a Zio patch in place.  Do not see any documentation in the chart of who placed it.   He states he is post to keep it in place for 14 days and should have removed on Thursday.  Because of his persistent dizziness and evidence of cerebellar stroke on his CT, feel it is imperative for him to get an MRI.  Because of this I removed his patch and will send  him back to MRI. [CH]  [phone] Patient per nursing at his baseline.  Discussed with the patient that he likely had a prior stroke which may be contributing to his dizziness.  Will get follow-up with neurology.  Will also treat for UTI. [CH]    Clinical Course User Index [CH] Megean Fabio, Mayer Masker, MD                                 Medical Decision Making Amount and/or Complexity of Data Reviewed Labs: ordered. Radiology: ordered.  Risk Prescription drug management.   This patient presents to the ED for concern of dizziness, fall, this involves an extensive number of treatment options, and is a complaint that carries with it a high risk of complications and morbidity.  I considered the following differential and admission for this acute, potentially life threatening condition.  The differential diagnosis includes orthostasis, dehydration, acute infection, metabolic derangement, stroke  MDM:    This is an 89 year old male who presents with ongoing dizziness.  Had a fall today.  Overall nontoxic and vital signs are notable for blood pressure 160/100.  He is not a good historian.  He does not appear to have any focal deficits on exam.  Labs obtained.  Labs notable for likely UTI with greater than 50 white cells and many bacteria.  Culture was sent.  Will treat with Rocephin and send with Keflex.  Of note, CT imaging does indicate likely an old cerebellar stroke.  Patient does not have any prior MRI imaging and no known history of stroke.  Will obtain MRI.  MRI does not show any acute strokes but does indicate likely an old cerebellar infarct.  Discussed this with the patient.  This could be causing his dizziness.  Based on notes in the  chart it appears he has had dizziness for some time.  Will refer to neurology as an outpatient.  (Labs, imaging, consults)  Labs: I Ordered, and personally interpreted labs.  The pertinent results include: CBC, CMP, lactate, EtOH, urinalysis  Imaging Studies ordered: I ordered imaging studies including CT, MRI I independently visualized and interpreted imaging. I agree with the radiologist interpretation  Additional history obtained from chart review.  External records from outside source obtained and reviewed including prior evaluations  Cardiac Monitoring: The patient was maintained on a cardiac monitor.  If on the cardiac monitor, I personally viewed and interpreted the cardiac monitored which showed an underlying rhythm of: Sinus  Reevaluation: After the interventions noted above, I reevaluated the patient and found that they have :improved  Social Determinants of Health:  lives independently  Disposition: Discharge  Co morbidities that complicate the patient evaluation  Past Medical History:  Diagnosis Date   Benign prostatic hyperplasia (BPH) with urinary urge incontinence 11/13/2017   Chronic left shoulder pain 11/13/2017   Chronic non-seasonal allergic rhinitis 02/17/2006   Chronic systolic heart failure (HCC) 10/31/2016   Echo (05/03/2015): LVEF 45-50%, grade 2 diastolic dysfunction   Coronary artery disease involving native coronary artery of native heart without angina pectoris 09/30/2007   s/p CABG on 01/05/2013: left internal mammary artery to left anterior descending, saphenous vein graft to obtuse marginal 1, sequential saphenous vein graft to acute marginal and posterior descending   Essential hypertension 11/04/2005   Gastroesophageal reflux disease with hiatal hernia 11/04/2005   History of prostate cancer 02/17/2006   Diagnosed 1995, s/p seed implantation in the late 1990's, PSA undetectable  04/10/2016   Hyperlipidemia 11/04/2005   Obesity (BMI 30.0-34.9) 11/13/2017    Peripheral arterial disease (HCC) 09/30/2007   with claudication, failed attempt at LLE PTCA   Type 2 diabetes mellitus with microalbuminuria, with long-term current use of insulin (HCC) 11/13/2017   Type 2 diabetes mellitus with mild 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, bilateral (HCC) 03/24/2018   Type 2 diabetes mellitus with peripheral neuropathy (HCC) 11/04/2005   Urethral stricture in male 11/13/2017   Noted on cystoscopy 12/18/2014.  Reportedly asked to do chronic intermittent self catheterizations, but has not.     Medicines Meds ordered this encounter  Medications   cefTRIAXone (ROCEPHIN) 1 g in sodium chloride 0.9 % 100 mL IVPB    Antibiotic Indication::   UTI   cephALEXin (KEFLEX) 500 MG capsule    Sig: Take 1 capsule (500 mg total) by mouth 3 (three) times daily.    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

## 2023-04-30 ENCOUNTER — Telehealth: Payer: Self-pay | Admitting: Endocrinology

## 2023-04-30 DIAGNOSIS — Z794 Long term (current) use of insulin: Secondary | ICD-10-CM

## 2023-04-30 MED ORDER — PUSH BUTTON SAFETY LANCETS 28G MISC: 1.0000 | Freq: Three times a day (TID) | 3 refills | Status: AC

## 2023-04-30 NOTE — Telephone Encounter (Signed)
 Done

## 2023-04-30 NOTE — Telephone Encounter (Signed)
 Patients daughter stopped by and asked if they can get a refill on the safety lancets push button 23g x 1.62mm. [facility] - [place], [place] - [address]  is the pharmacy

## 2023-05-01 LAB — URINE CULTURE: Culture: >=100000 — AB

## 2023-05-02 ENCOUNTER — Telehealth (HOSPITAL_BASED_OUTPATIENT_CLINIC_OR_DEPARTMENT_OTHER): Payer: Self-pay | Admitting: *Deleted

## 2023-05-02 NOTE — Telephone Encounter (Signed)
 Post ED Visit - Positive Culture Follow-up  Culture report reviewed by antimicrobial stewardship pharmacist: [facility] []  Nathan Batchelder, Pharm.D. []  Celedonio Miyamoto, [address].D., BCPS AQ-ID []  Garvin Fila, Pharm.D., BCPS []  Georgina Pillion, [address].D., BCPS []  [place], [address].D., BCPS, AAHIVP []  Estella Husk, Pharm.D., BCPS, AAHIVP []  Lysle Pearl, PharmD, BCPS []  Phillips Climes, PharmD, BCPS []  Agapito Games, PharmD, BCPS []  Verlan Friends, PharmD []  Mervyn Gay, PharmD, BCPS [x]  Malva Cogan, PharmD  [facility] []  Len Childs, PharmD []  Greer Pickerel, PharmD []  Adalberto Cole, PharmD []  Perlie Gold, Rph []  Lonell Face) Jean Rosenthal, PharmD []  Earl Many, PharmD []  Junita Push, PharmD []  Dorna Leitz, PharmD []  Terrilee Files, PharmD []  Lynann Beaver, PharmD []  Keturah Barre, PharmD []  Loralee Pacas, PharmD []  Bernadene Person, PharmD   Positive urine culture Treated with Cephalexin, organism sensitive to the same and no further patient follow-up is required at this time.  Patsey Berthold 05/02/2023, 10:09 AM

## 2023-05-04 ENCOUNTER — Ambulatory Visit: Admitting: Neurology

## 2023-05-04 ENCOUNTER — Encounter: Payer: Self-pay | Admitting: Neurology

## 2023-05-04 VITALS — BP 141/72 | HR 78 | Ht 73.0 in | Wt 242.0 lb

## 2023-05-04 DIAGNOSIS — R2681 Unsteadiness on feet: Secondary | ICD-10-CM

## 2023-05-04 DIAGNOSIS — Z7984 Long term (current) use of oral hypoglycemic drugs: Secondary | ICD-10-CM

## 2023-05-04 DIAGNOSIS — E1142 Type 2 diabetes mellitus with diabetic polyneuropathy: Secondary | ICD-10-CM

## 2023-05-04 DIAGNOSIS — R42 Dizziness and giddiness: Secondary | ICD-10-CM | POA: Diagnosis not present

## 2023-05-04 NOTE — Patient Instructions (Signed)
 I had a long discussion with the patient and his daughters regarding his dizziness which appears to be multifactorial and likely due to combination of his diabetic polyneuropathy, small vessel disease changes in the brain and mild vestibular dysfunction.  I recommend he get up slowly avoid sudden movements and use a cane at all times for ambulation.  Refer to physical therapy for gait and balance training.  Check CT angiogram of the brain and neck to rule out occlusive vertebrobasilar disease.  Continue aspirin for stroke prevention and maintain aggressive risk factor modification with strict control of diabetes with hemoglobin A1c goal below 6.5%, blood pressure goal below 130/90 and lipids with LDL cholesterol goal below 70 mg percent.  Return for follow-up in the future in 4 months with my nurse practitioner call earlier if necessary.  Dizziness Dizziness is a common problem. It makes you feel unsteady or light-headed. You may feel like you're about to faint. Dizziness can lead to getting hurt if you stumble or fall. It's more common to feel dizzy if you're an older adult. Many things can cause you to feel dizzy. These include: Medicines. Dehydration. This is when there's not enough water in your body. Illness. Follow these instructions at home: Eating and drinking  Drink enough fluid to keep your pee (urine) pale yellow. This helps keep you from getting dehydrated. Try to drink more clear fluids, such as water. Do not drink alcohol. Try to limit how much caffeine you take in. Try to limit how much salt, also called sodium, you take in. Activity Try not to make quick movements. Stand up slowly from sitting in a chair. Steady yourself until you feel okay. In the morning, first sit up on the side of the bed. When you feel okay, hold onto something and slowly stand up. Do this until you know that your balance is okay. If you need to stand in one place for a long time, move your legs often.  Tighten and relax the muscles in your legs while you're standing. Do not drive or use machines if you feel dizzy. Avoid bending down if you feel dizzy. Place items in your home so you can reach them without leaning over. Lifestyle Do not smoke, vape, or use products with nicotine or tobacco in them. If you need help quitting, talk with your health care provider. Try to lower your stress level. You can do this by using methods like yoga or meditation. Talk with your provider if you need help. General instructions Watch your dizziness for any changes. Take your medicines only as told by your provider. Talk with your provider if you think you're dizzy because of a medicine you're taking. Tell a friend or a family member that you're feeling dizzy. If they spot any changes in your behavior, have them call your provider. Contact a health care provider if: Your dizziness doesn't go away, or you have new symptoms. Your dizziness gets worse. You feel like you may vomit. You have trouble hearing. You have a fever. You have neck pain or a stiff neck. You fall or get hurt. Get help right away if: You vomit each time you eat or drink. You have watery poop and can't eat or drink. You have trouble talking, walking, swallowing, or using your arms, hands, or legs. You feel very weak. You're bleeding. You're not thinking clearly, or you have trouble forming sentences. A friend or family member may spot this. Your vision changes, or you get a very bad headache.  These symptoms may be an emergency. Call 911 right away. Do not wait to see if the symptoms will go away. Do not drive yourself to the hospital. This information is not intended to replace advice given to you by your health care provider. Make sure you discuss any questions you have with your health care provider. Document Revised: 10/23/2022 Document Reviewed: 03/06/2022 Elsevier Patient Education  2024 [employer].

## 2023-05-04 NOTE — Progress Notes (Signed)
 [facility] [address]. [place] [address] [phone]       OFFICE CONSULT NOTE  [name] Date of Birth:  05-26-1931 Medical Record Number:  [id number]   Referring MD: [name] [name]  Reason for Referral: Dizziness  HPI: [name] [name] is a 89 year old African-American male with a past medical history of chronic systolic heart failure, coronary artery disease status post CABG, gastroesophageal flux disease, history of prostate cancer, obesity, hyperlipidemia, peripheral arterial disease, diabetes and urethral stricture.  Patient states he has been having dizziness for the last several weeks.  He describes this as a feeling of imbalance rather than vertigo.  He feels like he is falling to 1 side but can hold on.  He has had no falls or injuries.  He presented to the ER previously in November 2024 for sudden onset of imbalance when he got out of bed [name] and fell off balance.  His CBG and blood pressure are fine during that admission.  He does admit to feeling of numbness and tingling in his feet and likely having diabetic peripheral neuropathy.  He was seen in the ER on 04/20/2023 for similar symptoms.  His blood sugar was 141 mg percent and blood pressure was normal.  Patient does use a cane he feels the dizziness comes on suddenly is often transient.  He denies any accompanying neurological symptoms in the form of double vision, blurred vision, vertigo, slurred speech or focal extremity weakness or numbness.  He does have significant decreased hearing which has been getting worse over the years.  He is considering a hearing aid but notes gotten 1 yet.  Denies any ringing in his years.  CT scan of the head on 04/19/2023 showed no acute abnormality and possibly right cerebellar stroke however MRI scan did not confirm this and showed only mild generalized atrophy and changes of age-related small vessel disease.  Lipid profile on 12/09/2022 showed LDL-cholesterol  to be optimal at 41 mg percent.  Hemoglobin A1c on 03/16/2023 was 7.2.  He also complains of chronic mild headaches intermittently for the last several months.  Feels the headaches are mild and not severe.  They last only few seconds to minutes.  They occur randomly without obvious provocation and he does not take any medications for it.  ROS:   14 system review of systems is positive for dizziness, off balance, tingling, numbness, decreased hearing, headaches all other systems negative  PMH:  Past Medical History:  Diagnosis Date   Benign prostatic hyperplasia (BPH) with urinary urge incontinence 11/13/2017   Chronic left shoulder pain 11/13/2017   Chronic non-seasonal allergic rhinitis 02/17/2006   Chronic systolic heart failure (HCC) 10/31/2016   Echo (05/03/2015): LVEF 45-50%, grade 2 diastolic dysfunction   Coronary artery disease involving native coronary artery of native heart without angina pectoris 09/30/2007   s/p CABG on 01/05/2013: left internal mammary artery to left anterior descending, saphenous vein graft to obtuse marginal 1, sequential saphenous vein graft to acute marginal and posterior descending   Essential hypertension 11/04/2005   Gastroesophageal reflux disease with hiatal hernia 11/04/2005   History of prostate cancer 02/17/2006   Diagnosed 1995, s/p seed implantation in the late 1990's, PSA undetectable 04/10/2016   Hyperlipidemia 11/04/2005   Obesity (BMI 30.0-34.9) 11/13/2017   Peripheral arterial disease (HCC) 09/30/2007   with claudication, failed attempt at LLE PTCA   Type 2 diabetes mellitus with microalbuminuria, with long-term current use of insulin (HCC) 11/13/2017   Type 2 diabetes mellitus  with mild 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, bilateral (HCC) 03/24/2018   Type 2 diabetes mellitus with peripheral neuropathy (HCC) 11/04/2005   Urethral stricture in male 11/13/2017   Noted on cystoscopy 12/18/2014.  Reportedly asked to do chronic  intermittent self catheterizations, but has not.    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  Tobacco Use   Smoking status: Former    Current packs/day: 0.00    Average packs/day: 0.8 packs/day for 20.0 years (15.0 ttl pk-yrs)    Types: Cigarettes    Start date: 07/08/1960    Quit date: 07/08/1980    Years since quitting: 42.8   Smokeless tobacco: Never  Vaping Use   Vaping status: Never Used  Substance and Sexual Activity   Alcohol use: No    Alcohol/week: 0.0 standard drinks of alcohol    Comment: 12/07/2012 "quit drinking alcohol > 25 yr ago"   Drug use: No   Sexual activity: Not Currently  Other Topics Concern   Not on file  Social History Narrative   Occupation: Optician, dispensing   Married   Social Drivers of Corporate investment banker Strain: Low Risk  (10/21/2022)   Received from [facility]   Overall Financial Resource Strain (CARDIA)    Difficulty of Paying Living Expenses: Not very hard  Food Insecurity: No Food Insecurity (10/21/2022)   [facility] from [facility]   Hunger Vital Sign    Worried About Running Out of Food in the Last Year: Never true    Ran Out of Food in the Last Year: Never true  Transportation Needs: No Transportation Needs (10/21/2022)   Received from [facility] - Transportation    Lack of Transportation (Medical): No    Lack of Transportation (Non-Medical): No  Physical Activity: Inactive (10/09/2021)   Exercise Vital Sign    Days of Exercise per Week: 0 days    Minutes of Exercise per Session: 0 min  Stress: No Stress Concern Present (10/09/2021)   [employer] of Occupational Health - Occupational Stress Questionnaire    Feeling of Stress : Not at all  Social Connections: Socially  Integrated (10/09/2021)   Social Connection and Isolation Panel [NHANES]    Frequency of Communication with Friends and Family: Twice a week    Frequency of Social Gatherings with Friends and Family: Twice a week    Attends Religious Services: More than 4 times per year    Active Member of [employer] or Organizations: Yes    Attends Engineer, structural: More than 4 times per year    Marital Status: Married  Catering manager Violence: Not At Risk (10/09/2021)   Humiliation, Afraid, Rape, and Kick questionnaire    Fear of Current or Ex-Partner: No    Emotionally Abused: No    Physically Abused: No    Sexually Abused: No    Medications:   Current Outpatient Medications on File Prior to Visit  Medication Sig Dispense Refill   allopurinol (ZYLOPRIM) 100 MG tablet Take 2 tablets by mouth once daily 180 tablet 0   amLODipine (NORVASC) 2.5 MG tablet Take 1 tablet (2.5 mg total) by mouth daily. 90 tablet 3   atorvastatin (LIPITOR)  40 MG tablet Take 1 tablet (40 mg total) by mouth daily. 90 tablet 3   blood glucose meter kit and supplies KIT Dispense based on patient and insurance preference. Use up to four times daily as directed. 1 each 0   gabapentin (NEURONTIN) 100 MG capsule Take 100 mg by mouth 2 (two) times daily. Pt takes 2 tablet 200 mg bid     losartan (COZAAR) 25 MG tablet Take 1 tablet (25 mg total) by mouth daily. 90 tablet 3   metFORMIN (GLUCOPHAGE) 500 MG tablet Take 2 tablets in the morning and 1 tablet in the evening. 270 tablet 3   metoprolol succinate (TOPROL XL) 50 MG 24 hr tablet Take 1 tablet (50 mg total) by mouth daily. Take with or immediately following a meal. 90 tablet 3   Push Button Safety Lancets 28G MISC 1 Lancet by Does not apply route 3 (three) times daily. 300 each 3   tamsulosin (FLOMAX) 0.4 MG CAPS capsule Take 1 capsule (0.4 mg total) by mouth daily. 90 capsule 3   cetirizine (ZYRTEC ALLERGY) 10 MG tablet Take 0.5 tablets (5 mg total) by mouth daily. (Patient  taking differently: Take 5 mg by mouth as needed for allergies.) 15 tablet 0   colchicine 0.6 MG tablet Take 1 tablet (0.6 mg total) by mouth 2 (two) times daily as needed (gout flare). (Patient not taking: Reported on 05/04/2023) 60 tablet 1   insulin glargine (LANTUS) 100 UNIT/ML injection Inject 0.12 mLs (12 Units total) into the skin daily. (Patient not taking: Reported on 05/04/2023) 10 mL 11   insulin lispro (HUMALOG) 100 UNIT/ML injection INJECT 5 UNITS INTO THE SKIN 3 TIMES PER DAY BEFORE EACH MEAL (Patient not taking: Reported on 05/04/2023) 10 mL 11   metoprolol tartrate (LOPRESSOR) 25 MG tablet Take 2 tablets (50 mg total) by mouth 2 (two) times daily. (Patient not taking: Reported on 05/04/2023) 180 tablet 1   nitroGLYCERIN (NITROSTAT) 0.4 MG SL tablet Place 1 tablet (0.4 mg total) under the tongue every 5 (five) minutes as needed for chest pain. (Patient not taking: Reported on 05/04/2023) 25 tablet 3   pantoprazole (PROTONIX) 20 MG tablet Take 20 mg by mouth as needed for heartburn. (Patient not taking: Reported on 05/04/2023)     trospium (SANCTURA) 20 MG tablet Take 20 mg by mouth as needed. (Patient not taking: Reported on 05/04/2023)     Current Facility-Administered Medications on File Prior to Visit  Medication Dose Route Frequency Provider Last Rate Last Admin   Insulin Pen Needle (NOVOFINE) 100 each  10 packet Subcutaneous TID Reather Littler, MD        Allergies:  No Known Allergies  Physical Exam General: Obese elderly African-American male, seated, in no evident distress Head: head normocephalic and atraumatic.   Neck: supple with no carotid or supraclavicular bruits Cardiovascular: regular rate and rhythm, no murmurs Musculoskeletal: no deformity Skin:  no rash/petichiae Vascular:  Normal pulses all extremities  Neurologic Exam Mental Status: Awake and fully alert. Oriented to place and time. Recent and remote memory intact. Attention span, concentration and fund of knowledge  appropriate. Mood and affect appropriate.  Cranial Nerves: Fundoscopic exam reveals sharp disc margins. Pupils equal, briskly reactive to light. Extraocular movements full without nystagmus. Visual fields full to confrontation. Hearing i severely diminished bilaterally.  Facial sensation intact. Face, tongue, palate moves normally and symmetrically.  Motor: Normal bulk and tone. Normal strength in all tested extremity muscles. Sensory.: intact to touch , pinprick ,  but impaired position and vibratory sensation from knee down.  Positive Romberg's.  Coordination: Rapid alternating movements normal in all extremities. Finger-to-nose and heel-to-shin performed accurately bilaterally. Gait and Station: Arises from chair without difficulty. Stance is slightly stooped.  Gait is broad-based using a cane.  Unsteady on her narrow-base. Reflexes: 1+ and symmetric. Toes downgoing.       ASSESSMENT: 89 year old African-American male with subacute dizziness and gait and balance difficulties likely multifactorial due to combination of diabetic polyneuropathy, age-related changes of chronic small vessel disease and mild vestibular dysfunction.     PLAN:I had a long discussion with the patient and his daughters regarding his dizziness which appears to be multifactorial and likely due to combination of his diabetic polyneuropathy, small vessel disease changes in the brain and mild vestibular dysfunction.  I recommend he get up slowly avoid sudden movements and use a cane at all times for ambulation.  Refer to physical therapy for gait and balance training.  Check CT angiogram of the brain and neck to rule out occlusive vertebrobasilar disease.  Continue aspirin for stroke prevention and maintain aggressive risk factor modification with strict control of diabetes with hemoglobin A1c goal below 6.5%, blood pressure goal below 130/90 and lipids with LDL cholesterol goal below 70 mg percent.  Return for follow-up in the  future in 4 months with my nurse practitioner call earlier if necessary.  Greater than 50% time during this 45-minute consultation visit was spent in counseling and coordination of care and discussion patient and care team and answering questions.  Delia Heady, MD Note: This document was prepared with digital dictation and possible smart phrase technology. Any transcriptional errors that result from this process are unintentional.

## 2023-05-06 ENCOUNTER — Ambulatory Visit: Attending: Neurology

## 2023-05-06 ENCOUNTER — Telehealth: Payer: Self-pay | Admitting: Neurology

## 2023-05-06 VITALS — BP 127/62 | HR 72

## 2023-05-06 DIAGNOSIS — R42 Dizziness and giddiness: Secondary | ICD-10-CM | POA: Diagnosis present

## 2023-05-06 DIAGNOSIS — R2681 Unsteadiness on feet: Secondary | ICD-10-CM | POA: Diagnosis present

## 2023-05-06 DIAGNOSIS — M6281 Muscle weakness (generalized): Secondary | ICD-10-CM | POA: Diagnosis present

## 2023-05-06 NOTE — Therapy (Signed)
 OUTPATIENT PHYSICAL THERAPY VESTIBULAR EVALUATION     Patient Name: [name] [name] MRN: [id number] DOB:Jun 04, 1931, 89 y.o., male Today's Date: 05/06/2023  END OF SESSION:  PT End of Session - 05/06/23 1058     Visit Number 1    Number of Visits 5    Date for PT Re-Evaluation 06/19/23   pushed out due to delay in scheduling   Authorization Type [employer] MCR    Progress Note Due on Visit 10    PT Start Time 1100    PT Stop Time 1145    PT Time Calculation (min) 45 min    Activity Tolerance Patient tolerated treatment well    Behavior During Therapy [facility] for tasks assessed/performed             Past Medical History:  Diagnosis Date   Benign prostatic hyperplasia (BPH) with urinary urge incontinence 11/13/2017   Chronic left shoulder pain 11/13/2017   Chronic non-seasonal allergic rhinitis 02/17/2006   Chronic systolic heart failure (HCC) 10/31/2016   Echo (05/03/2015): LVEF 45-50%, grade 2 diastolic dysfunction   Coronary artery disease involving native coronary artery of native heart without angina pectoris 09/30/2007   s/p CABG on 01/05/2013: left internal mammary artery to left anterior descending, saphenous vein graft to obtuse marginal 1, sequential saphenous vein graft to acute marginal and posterior descending   Essential hypertension 11/04/2005   Gastroesophageal reflux disease with hiatal hernia 11/04/2005   History of prostate cancer 02/17/2006   Diagnosed 1995, s/p seed implantation in the late 1990's, PSA undetectable 04/10/2016   Hyperlipidemia 11/04/2005   Obesity (BMI 30.0-34.9) 11/13/2017   Peripheral arterial disease (HCC) 09/30/2007   with claudication, failed attempt at LLE PTCA   Type 2 diabetes mellitus with microalbuminuria, with long-term current use of insulin (HCC) 11/13/2017   Type 2 diabetes mellitus with mild 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, bilateral (HCC) 03/24/2018   Type 2 diabetes mellitus with peripheral neuropathy (HCC)  11/04/2005   Urethral stricture in male 11/13/2017   Noted on cystoscopy 12/18/2014.  Reportedly asked to do chronic intermittent self catheterizations, but has not.   Past Surgical History:  Procedure Laterality Date   BREAST SURGERY     CARDIAC CATHETERIZATION  10/05/2008   REVEALS HYPOKINESIS OF THE LATERAL WALL AND EF 50-55%   CORONARY ANGIOPLASTY WITH STENT PLACEMENT     CORONARY ARTERY BYPASS GRAFT N/A 01/05/2013   Procedure: CORONARY ARTERY BYPASS GRAFTING (CABG);  Surgeon: Loreli Slot, MD;  Location: [facility] OR;  Service: Open Heart Surgery;  Laterality: N/A;   INTRAOPERATIVE TRANSESOPHAGEAL ECHOCARDIOGRAM N/A 01/05/2013   Procedure: INTRAOPERATIVE TRANSESOPHAGEAL ECHOCARDIOGRAM;  Surgeon: Loreli Slot, MD;  Location[facility] OR;  Service: Open Heart Surgery;  Laterality: N/A;   LEFT HEART CATHETERIZATION WITH CORONARY ANGIOGRAM N/A 01/03/2013   Procedure: LEFT HEART CATHETERIZATION WITH CORONARY ANGIOGRAM;  Surgeon: [name], MD;  Location: [facility] CATH LAB;  Service: Cardiovascular;  Laterality: N/A;   LOWER EXTREMITY ANGIOGRAM Right 12/07/2012   unsuccessful attempt at percutaneous revascularization of a calcified long segment chronic total occlusion mid left SFA /notes 12/07/2012   LOWER EXTREMITY ANGIOGRAM N/A 12/07/2012   Procedure: LOWER EXTREMITY ANGIOGRAM;  Surgeon: [name], MD;  Location[facility] CATH LAB;  Service: Cardiovascular;  Laterality: N/A;   PROSTATE SURGERY  10/04/1988   Patient Active Problem List   Diagnosis Date Noted   Right shoulder pain 06/27/2021   Hearing loss 03/01/2021   Premature atrial contractions 03/01/2021   HFmrEF (heart failure with  mildly reudced EF) 12/19/2020   Mild cognitive impairment 06/25/2020   Esophageal dysphagia 04/24/2020   Chronic gout 05/27/2018   Constipation 03/26/2018   Type 2 diabetes mellitus with mild 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, bilateral (HCC) 03/24/2018   Healthcare  maintenance 11/13/2017   Chronic left shoulder pain 11/13/2017   Severe obesity (BMI 35.0-35.9 with comorbidity) (HCC) 11/13/2017   Urethral stricture in male 11/13/2017   Benign prostatic hyperplasia (BPH) with urinary urge incontinence 11/13/2017   Chronic combined systolic (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) 10/31/2016   Arthritis 12/19/2014   Coronary artery disease involving native coronary artery of native heart without angina pectoris 09/30/2007   Peripheral arterial disease (HCC) 09/30/2007   Chronic non-seasonal allergic rhinitis 02/17/2006   History of prostate cancer 02/17/2006   Hyperlipidemia 11/04/2005   Essential hypertension 11/04/2005   Gastroesophageal reflux disease with hiatal hernia 11/04/2005    PCP: Velta Addison, MD REFERRING PROVIDER: Delia Heady, MD  REFERRING DIAG:  R42 (ICD-10-CM) - Dizziness  R26.81 (ICD-10-CM) - Gait instability    THERAPY DIAG:  Dizziness and giddiness  Unsteadiness on feet  ONSET DATE: 05/04/23 referral  Rationale for Evaluation and Treatment: Rehabilitation  SUBJECTIVE:   SUBJECTIVE STATEMENT: Patient arrives to clinic with [facility] and adult dtr- who stayed in waiting room. Patient reports that "mailman brought him bladder pills." Dtr came back partway through to supplement history.  Pt accompanied by: family member  PERTINENT HISTORY: CHF, CAD, HTN, GERD, h/o prostate CA, HLD, PAD, DM2,   PAIN:  Are you having pain? No  PRECAUTIONS: Fall   WEIGHT BEARING RESTRICTIONS: No  FALLS: Has patient fallen in last 6 months? Yes. Number of falls 1; last Friday d/t dizziness  LIVING ENVIRONMENT: Lives with: lives alone Lives in: House/apartment Stairs: Yes: External: 4 steps; none Has following equipment at home: Single point cane  PLOF: Independent; driving  PATIENT GOALS: patient unable to state  OBJECTIVE:  Note: Objective measures were completed at Evaluation unless otherwise  noted.  DIAGNOSTIC FINDINGS: 04/29/23 brain MRI IMPRESSION: Aging brain without acute or reversible finding.  COGNITION: Overall cognitive status: Impaired and Difficulty to assess due to: hearing impairment   SENSATION: Light touch: Impaired  B LE distal to knee   POSTURE:  rounded shoulders and forward head  Cervical ROM:   ~75% in all directions, mild pain in R upper c-spine  STRENGTH: WFL    BED MOBILITY:  Independent and denies dizziness   GAIT: Gait pattern: step through pattern, decreased arm swing- Right, decreased arm swing- Left, decreased stride length, Right foot flat, Left foot flat, shuffling, trunk flexed, wide BOS, poor foot clearance- Right, and poor foot clearance- Left Distance walked: clinic Assistive device utilized: Single point cane Level of assistance: Modified independence and SBA Comments: slow gait speed   VESTIBULAR ASSESSMENT:  GENERAL OBSERVATION: NAD, using SPC   SYMPTOM BEHAVIOR:  Subjective history: this started ~2-3 months ago, denies MOI just that his "sugar doctor" gave him medicine and he's been taking it (no medication changes reported 2-3 months ago)   Non-Vestibular symptoms:  none  Type of dizziness: Imbalance (Disequilibrium)  Frequency: 1-2x a day  Duration: seconds to minutes  Aggravating factors: Induced by position change: sit to stand and not eating/drinking  Relieving factors: slow movements and eating/drinking  Progression of symptoms: unchanged  OCULOMOTOR EXAM:  Ocular Alignment: normal L eye ptosis   Ocular ROM: No Limitations  Spontaneous Nystagmus: absent  Gaze-Induced Nystagmus: absent  Smooth Pursuits:  saccades age-appropriate  Saccades: slow age-appropriate   VESTIBULAR - OCULAR REFLEX:   Slow VOR: Normal  VOR Cancellation: Normal  Head-Impulse Test: HIT Right: negative HIT Left: negative   POSITIONAL TESTING: not indicated based on subjective report  OTHOSTATICS: sit: 147/53; standing:  127/62                                                                                                                            TREATMENT  Self care/home management: -extensive education on multifactorial causes of dizziness and imbalance -time spent typing up safety precautions and discussing with patient and family   PATIENT EDUCATION: Education details: PT POC, exam findings, see above Person educated: Patient and Child(ren) Education method: Explanation and Handouts Education comprehension: verbalized understanding and needs further education  HOME EXERCISE PROGRAM: Safety Precautions:  Eat enough healthy food Make sure you're drinking enough fluids (low sugar!) Move slowly (sitting to standing, laying down to sitting, etc) Scan your environment for obstacles (curbs, changes in floor surfaces) Use your cane or walker AT ALL TIMES! If you feel funny/dizzy/"off" call your family and/or 911 GOALS: Goals reviewed with patient? Yes  SHORT TERM GOALS: = LTG based on PT POC length   LONG TERM GOALS: Target date: 06/19/23  Pt will be compliant with final HEP with assist as needed to improve balance and functional strength  Baseline: to be provided Goal status: INITIAL  2.  MCTSIB goal  Baseline: to be provided Goal status: INITIAL  3.  Patient and family will report >90% compliance with AD at home to reduce risk for falling Baseline: ~50% per patient and family  Goal status: INITIAL  ASSESSMENT:  CLINICAL IMPRESSION: Patient is an 89 y.o. male who was seen today for physical therapy evaluation and treatment for dizziness and imbalance. He presents with a normal vestibular exam. PT inclined to think that his dizziness is primarily due to fluctuating BG and BP. He does report that his symptoms resolve when he eats and drinks enough. He is also mildly orthostatic on exam today. His balance is impaired given his h/o neuropathy and would benefit from skilled PT services to  minimize his risk for falling.    OBJECTIVE IMPAIRMENTS: Abnormal gait, cardiopulmonary status limiting activity, decreased balance, decreased endurance, decreased knowledge of condition, decreased knowledge of use of DME, decreased strength, decreased safety awareness, dizziness, impaired perceived functional ability, impaired sensation, and impaired vision/preception.   ACTIVITY LIMITATIONS: stairs, bed mobility, hygiene/grooming, and locomotion level  PARTICIPATION LIMITATIONS: meal prep, cleaning, interpersonal relationship, driving, shopping, and community activity  PERSONAL FACTORS: Age, Fitness, Past/current experiences, Social background, and Time since onset of injury/illness/exacerbation are also affecting patient's functional outcome.   REHAB POTENTIAL: Fair etiology  CLINICAL DECISION MAKING: Stable/uncomplicated  EVALUATION COMPLEXITY: Low   PLAN:  PT FREQUENCY: 1x/week per patient and family request  PT DURATION: 4 weeks  PLANNED INTERVENTIONS: 97164- PT Re-evaluation, 97110-Therapeutic exercises, 97530- Therapeutic activity, [id number]- Neuromuscular re-education, 97535- Self Care, 40981-  Manual therapy, [id number]- Gait training, 40981- Orthotic Fit/training, 19147- Canalith repositioning, 82956- Aquatic Therapy, Patient/Family education, Balance training, Stair training, Vestibular training, Visual/preceptual remediation/compensation, Cognitive remediation, and DME instructions  PLAN FOR NEXT SESSION: HEP, assess MCTSIB + write goal   Westley Foots, PT Westley Foots, PT, DPT, CBIS  05/06/2023, 12:15 PM

## 2023-05-06 NOTE — Telephone Encounter (Signed)
 Ethlyn Gallery: [id number] exp. 05/06/23-07/05/23 sent to GI [phone]

## 2023-05-20 ENCOUNTER — Ambulatory Visit

## 2023-05-20 VITALS — BP 145/64 | HR 61

## 2023-05-20 DIAGNOSIS — M6281 Muscle weakness (generalized): Secondary | ICD-10-CM

## 2023-05-20 DIAGNOSIS — R2681 Unsteadiness on feet: Secondary | ICD-10-CM

## 2023-05-20 DIAGNOSIS — R42 Dizziness and giddiness: Secondary | ICD-10-CM | POA: Diagnosis not present

## 2023-05-20 NOTE — Therapy (Signed)
 OUTPATIENT PHYSICAL THERAPY VESTIBULAR TREATMENT     Patient Name: Keefer Soulliere MRN: [id number] DOB:[birth year], 89 y.o., male Today's Date: 05/20/2023  END OF SESSION:  PT End of Session - 05/20/23 1057     Visit Number 2    Number of Visits 5    Date for PT Re-Evaluation 06/19/23    Authorization Type [employer] MCR    Progress Note Due on Visit 10    PT Start Time 1100    PT Stop Time 1139    PT Time Calculation (min) 39 min    Equipment Utilized During Treatment Gait belt    Activity Tolerance Patient tolerated treatment well    Behavior During Therapy WFL for tasks assessed/performed             Past Medical History:  Diagnosis Date   Benign prostatic hyperplasia (BPH) with urinary urge incontinence 11/13/2017   Chronic left shoulder pain 11/13/2017   Chronic non-seasonal allergic rhinitis 02/17/2006   Chronic systolic heart failure (HCC) 10/31/2016   Echo (05/03/2015): LVEF 45-50%, grade 2 diastolic dysfunction   Coronary artery disease involving native coronary artery of native heart without angina pectoris 09/30/2007   s/p CABG on 01/05/2013: left internal mammary artery to left anterior descending, saphenous vein graft to obtuse marginal 1, sequential saphenous vein graft to acute marginal and posterior descending   Essential hypertension 11/04/2005   Gastroesophageal reflux disease with hiatal hernia 11/04/2005   History of prostate cancer 02/17/2006   Diagnosed 1995, s/p seed implantation in the late 1990's, PSA undetectable 04/10/2016   Hyperlipidemia 11/04/2005   Obesity (BMI 30.0-34.9) 11/13/2017   Peripheral arterial disease (HCC) 09/30/2007   with claudication, failed attempt at LLE PTCA   Type 2 diabetes mellitus with microalbuminuria, with long-term current use of insulin (HCC) 11/13/2017   Type 2 diabetes mellitus with mild 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, bilateral (HCC) 03/24/2018   Type 2 diabetes mellitus with peripheral  neuropathy (HCC) 11/04/2005   Urethral stricture in male 11/13/2017   Noted on cystoscopy 12/18/2014.  Reportedly asked to do chronic intermittent self catheterizations, but has not.   Past Surgical History:  Procedure Laterality Date   BREAST SURGERY     CARDIAC CATHETERIZATION  10/05/2008   REVEALS HYPOKINESIS OF THE LATERAL WALL AND EF 50-55%   CORONARY ANGIOPLASTY WITH STENT PLACEMENT     CORONARY ARTERY BYPASS GRAFT N/A 01/05/2013   Procedure: CORONARY ARTERY BYPASS GRAFTING (CABG);  Surgeon: Loreli Slot, MD;  Location: [facility] OR;  Service: Open Heart Surgery;  Laterality: N/A;   INTRAOPERATIVE TRANSESOPHAGEAL ECHOCARDIOGRAM N/A 01/05/2013   Procedure: INTRAOPERATIVE TRANSESOPHAGEAL ECHOCARDIOGRAM;  Surgeon: Loreli Slot, MD;  Location: [facility] OR;  Service: Open Heart Surgery;  Laterality: N/A;   LEFT HEART CATHETERIZATION WITH CORONARY ANGIOGRAM N/A 01/03/2013   Procedure: LEFT HEART CATHETERIZATION WITH CORONARY ANGIOGRAM;  Surgeon: [name], MD;  Location: [facility] CATH LAB;  Service: Cardiovascular;  Laterality: N/A;   LOWER EXTREMITY ANGIOGRAM Right 12/07/2012   unsuccessful attempt at percutaneous revascularization of a calcified long segment chronic total occlusion mid left SFA /notes 12/07/2012   LOWER EXTREMITY ANGIOGRAM N/A 12/07/2012   Procedure: LOWER EXTREMITY ANGIOGRAM;  Surgeon: [name], MD;  Location: [facility] CATH LAB;  Service: Cardiovascular;  Laterality: N/A;   PROSTATE SURGERY  10/04/1988   Patient Active Problem List   Diagnosis Date Noted   Right shoulder pain 06/27/2021   Hearing loss 03/01/2021   Premature atrial contractions 03/01/2021   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) 12/19/2020   Mild cognitive impairment 06/25/2020   Esophageal dysphagia 04/24/2020   Chronic gout 05/27/2018   Constipation 03/26/2018   Type 2 diabetes mellitus with mild 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, bilateral (HCC) 03/24/2018    Healthcare maintenance 11/13/2017   Chronic left shoulder pain 11/13/2017   Severe obesity (BMI 35.0-35.9 with comorbidity) (HCC) 11/13/2017   Urethral stricture in male 11/13/2017   Benign prostatic hyperplasia (BPH) with urinary urge incontinence 11/13/2017   Chronic combined systolic (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) 10/31/2016   Arthritis 12/19/2014   Coronary artery disease involving native coronary artery of native heart without angina pectoris 09/30/2007   Peripheral arterial disease (HCC) 09/30/2007   Chronic non-seasonal allergic rhinitis 02/17/2006   History of prostate cancer 02/17/2006   Hyperlipidemia 11/04/2005   Essential hypertension 11/04/2005   Gastroesophageal reflux disease with hiatal hernia 11/04/2005    PCP: Velta Addison, MD REFERRING PROVIDER: Delia Heady, MD  REFERRING DIAG:  R42 (ICD-10-CM) - Dizziness  R26.81 (ICD-10-CM) - Gait instability    THERAPY DIAG:  Dizziness and giddiness  Unsteadiness on feet  Muscle weakness (generalized)  ONSET DATE: 05/04/23 referral  Rationale for Evaluation and Treatment: Rehabilitation  SUBJECTIVE:   SUBJECTIVE STATEMENT: Patient arrives to clinic with dtr, using SPC. He reports using it outside the home only, not inside his house. Denies falls. Dtr reports BG has been "good."  Pt accompanied by: family member  PERTINENT HISTORY: CHF, CAD, HTN, GERD, h/o prostate CA, HLD, PAD, DM2,   PAIN:  Are you having pain? No  PRECAUTIONS: Fall  PATIENT GOALS: patient unable to state  OBJECTIVE:  Note: Objective measures were completed at Evaluation unless otherwise noted.  DIAGNOSTIC FINDINGS: 04/29/23 brain MRI IMPRESSION: Aging brain without acute or reversible finding.   VESTIBULAR ASSESSMENT: MCTSIB: -condition 1: 15s -condition 2: 25s -condition 3: DNT based on results above  -condition 4: DNT based on results above                                                                                                                              TREATMENT  Therex: -scifit hills level 3 x10 mins B UE/LE for CV conditioning   -BP significantly elevated afterward (147/114)  -returned to normal after rest -prescribed initial HEP (see below)   PATIENT EDUCATION: Education details: exam findings, initial HEP, safe BP parameters Person educated: Patient and Child(ren) Education method: Explanation and Handouts Education comprehension: verbalized understanding and needs further education  HOME EXERCISE PROGRAM: Safety Precautions:  Eat enough healthy food Make sure you're drinking enough fluids (low sugar!) Move slowly (sitting to standing, laying down to sitting, etc) Scan your environment for obstacles (curbs, changes in floor surfaces) Use your cane or walker AT ALL TIMES! If you feel funny/dizzy/"off" call your family and/or 911  Access Code: [email or web address] URL: [email or web address] Date: 05/20/2023 Prepared by: Merry Lofty  Exercises - Sit to Stand with Counter  Support  - 1 x daily - 7 x weekly - 3 sets - 10 reps - Supine Bridge  - 1 x daily - 7 x weekly - 3 sets - 10 reps - Seated March  - 1 x daily - 7 x weekly - 3 sets - 10 reps - Seated Heel Raise  - 1 x daily - 7 x weekly - 3 sets - 10 reps - Seated Hip Abduction with Resistance  - 1 x daily - 7 x weekly - 3 sets - 10 reps - Seated Heel Slide  - 1 x daily - 7 x weekly - 3 sets - 10 reps GOALS: Goals reviewed with patient? Yes  SHORT TERM GOALS: = LTG based on PT POC length   LONG TERM GOALS: Target date: 06/19/23  Pt will be compliant with final HEP with assist as needed to improve balance and functional strength  Baseline: to be provided Goal status: INITIAL  2.  Patient will complete >/= 30s on condition 1 and 2 of MCTSIB to demonstrate improved balance on solid surfaces Baseline: 15s and 25s Goal status: REVISED  3.  Patient and family will report >90% compliance with AD at  home to reduce risk for falling Baseline: ~50% per patient and family  Goal status: INITIAL  ASSESSMENT:  CLINICAL IMPRESSION: Patient seen for skilled PT session with emphasis on establishing HEP and completing outcome measures. Patient with limited performance on MCTSIB indicating poor balance, even on solid surface with EO increasing his risk for falling. Patient with poor cardiac response to mild CV exercise requiring extended rest break to allow for recovery. Prescribed primarily seated HEP as patient does live alone. Continue POC.   OBJECTIVE IMPAIRMENTS: Abnormal gait, cardiopulmonary status limiting activity, decreased balance, decreased endurance, decreased knowledge of condition, decreased knowledge of use of DME, decreased strength, decreased safety awareness, dizziness, impaired perceived functional ability, impaired sensation, and impaired vision/preception.   ACTIVITY LIMITATIONS: stairs, bed mobility, hygiene/grooming, and locomotion level  PARTICIPATION LIMITATIONS: meal prep, cleaning, interpersonal relationship, driving, shopping, and community activity  PERSONAL FACTORS: Age, Fitness, Past/current experiences, Social background, and Time since onset of injury/illness/exacerbation are also affecting patient's functional outcome.   REHAB POTENTIAL: Fair etiology  CLINICAL DECISION MAKING: Stable/uncomplicated  EVALUATION COMPLEXITY: Low   PLAN:  PT FREQUENCY: 1x/week per patient and family request  PT DURATION: 4 weeks  PLANNED INTERVENTIONS: 97164- PT Re-evaluation, 97110-Therapeutic exercises, 97530- Therapeutic activity, 97112- Neuromuscular re-education, [phone]- Self Care, 60454- Manual therapy, [phone]- Gait training, [phone]- Orthotic Fit/training, [phone]- Canalith repositioning, [phone]- Aquatic Therapy, Patient/Family education, Balance training, Stair training, Vestibular training, Visual/preceptual remediation/compensation, Cognitive remediation, and DME  instructions  PLAN FOR NEXT SESSION: functional strength, balance EO/EC on solid surfaces first, CHECK BP   Rebecca Campus, PT Rebecca Campus, PT, DPT, CBIS  05/20/2023, 12:10 PM

## 2023-05-27 ENCOUNTER — Ambulatory Visit

## 2023-05-27 VITALS — BP 162/72

## 2023-05-27 DIAGNOSIS — R2681 Unsteadiness on feet: Secondary | ICD-10-CM

## 2023-05-27 DIAGNOSIS — R42 Dizziness and giddiness: Secondary | ICD-10-CM

## 2023-05-27 DIAGNOSIS — M6281 Muscle weakness (generalized): Secondary | ICD-10-CM

## 2023-05-27 NOTE — Therapy (Signed)
 OUTPATIENT PHYSICAL THERAPY VESTIBULAR TREATMENT     Patient Name: [name] [name] MRN: [id number] DOB:[birth year], 89 y.o., male Today's Date: 05/27/2023  END OF SESSION:  PT End of Session - 05/27/23 1057     Visit Number 3    Number of Visits 5    Date for PT Re-Evaluation 06/19/23    Authorization Type [employer] MCR    Progress Note Due on Visit 10    PT Start Time 1103   patient in bathroom   PT Stop Time 1141    PT Time Calculation (min) 38 min    Equipment Utilized During Treatment Gait belt    Activity Tolerance Patient tolerated treatment well    Behavior During Therapy WFL for tasks assessed/performed             Past Medical History:  Diagnosis Date   Benign prostatic hyperplasia (BPH) with urinary urge incontinence 11/13/2017   Chronic left shoulder pain 11/13/2017   Chronic non-seasonal allergic rhinitis 02/17/2006   Chronic systolic heart failure (HCC) 10/31/2016   Echo (05/03/2015): LVEF 45-50%, grade 2 diastolic dysfunction   Coronary artery disease involving native coronary artery of native heart without angina pectoris 09/30/2007   s/p CABG on 01/05/2013: left internal mammary artery to left anterior descending, saphenous vein graft to obtuse marginal 1, sequential saphenous vein graft to acute marginal and posterior descending   Essential hypertension 11/04/2005   Gastroesophageal reflux disease with hiatal hernia 11/04/2005   History of prostate cancer 02/17/2006   Diagnosed 1995, s/p seed implantation in the late 1990's, PSA undetectable 04/10/2016   Hyperlipidemia 11/04/2005   Obesity (BMI 30.0-34.9) 11/13/2017   Peripheral arterial disease (HCC) 09/30/2007   with claudication, failed attempt at LLE PTCA   Type 2 diabetes mellitus with microalbuminuria, with long-term current use of insulin  (HCC) 11/13/2017   Type 2 diabetes mellitus with mild 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, bilateral (HCC) 03/24/2018   Type 2 diabetes mellitus  with peripheral neuropathy (HCC) 11/04/2005   Urethral stricture in male 11/13/2017   Noted on cystoscopy 12/18/2014.  Reportedly asked to do chronic intermittent self catheterizations, but has not.   Past Surgical History:  Procedure Laterality Date   BREAST SURGERY     CARDIAC CATHETERIZATION  10/05/2008   REVEALS HYPOKINESIS OF THE LATERAL WALL AND EF 50-55%   CORONARY ANGIOPLASTY WITH STENT PLACEMENT     CORONARY ARTERY BYPASS GRAFT N/A 01/05/2013   Procedure: CORONARY ARTERY BYPASS GRAFTING (CABG);  Surgeon: Zelphia Higashi, MD;  Location: [facility] OR;  Service: Open Heart Surgery;  Laterality: N/A;   INTRAOPERATIVE TRANSESOPHAGEAL ECHOCARDIOGRAM N/A 01/05/2013   Procedure: INTRAOPERATIVE TRANSESOPHAGEAL ECHOCARDIOGRAM;  Surgeon: Zelphia Higashi, MD;  Location: [facility] OR;  Service: Open Heart Surgery;  Laterality: N/A;   LEFT HEART CATHETERIZATION WITH CORONARY ANGIOGRAM N/A 01/03/2013   Procedure: LEFT HEART CATHETERIZATION WITH CORONARY ANGIOGRAM;  Surgeon: Avanell Leigh, MD;  Location: [facility] CATH LAB;  Service: Cardiovascular;  Laterality: N/A;   LOWER EXTREMITY ANGIOGRAM Right 12/07/2012   unsuccessful attempt at percutaneous revascularization of a calcified long segment chronic total occlusion mid left SFA /notes 12/07/2012   LOWER EXTREMITY ANGIOGRAM N/A 12/07/2012   Procedure: LOWER EXTREMITY ANGIOGRAM;  Surgeon: Avanell Leigh, MD;  Location: [facility] CATH LAB;  Service: Cardiovascular;  Laterality: N/A;   PROSTATE SURGERY  10/04/1988   Patient Active Problem List   Diagnosis Date Noted   Right shoulder pain 06/27/2021   Hearing loss 03/01/2021   Premature atrial contractions 03/01/2021  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) 12/19/2020   Mild cognitive impairment 06/25/2020   Esophageal dysphagia 04/24/2020   Chronic gout 05/27/2018   Constipation 03/26/2018   Type 2 diabetes mellitus with mild 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, bilateral  (HCC) 03/24/2018   Healthcare maintenance 11/13/2017   Chronic left shoulder pain 11/13/2017   Severe obesity (BMI 35.0-35.9 with comorbidity) (HCC) 11/13/2017   Urethral stricture in male 11/13/2017   Benign prostatic hyperplasia (BPH) with urinary urge incontinence 11/13/2017   Chronic combined systolic (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) 10/31/2016   Arthritis 12/19/2014   Coronary artery disease involving native coronary artery of native heart without angina pectoris 09/30/2007   Peripheral arterial disease (HCC) 09/30/2007   Chronic non-seasonal allergic rhinitis 02/17/2006   History of prostate cancer 02/17/2006   Hyperlipidemia 11/04/2005   Essential hypertension 11/04/2005   Gastroesophageal reflux disease with hiatal hernia 11/04/2005    PCP: Adeline Hone, MD REFERRING PROVIDER: Ardella Beaver, MD  REFERRING DIAG:  R42 (ICD-10-CM) - Dizziness  R26.81 (ICD-10-CM) - Gait instability    THERAPY DIAG:  Dizziness and giddiness  Unsteadiness on feet  Muscle weakness (generalized)  ONSET DATE: 05/04/23 referral  Rationale for Evaluation and Treatment: Rehabilitation  SUBJECTIVE:   SUBJECTIVE STATEMENT: Patient arrives to clinic alone, thought his daughter "was already in here" (?). States he feels "so-so," but did not elaborate. Using his cane "all the time" per patient. Denies falls. Does endorse a few episodes of dizziness since last visit. He reports feeling very rushed this morning.  Pt accompanied by: family member  PERTINENT HISTORY: CHF, CAD, HTN, GERD, h/o prostate CA, HLD, PAD, DM2,   PAIN:  Are you having pain? No  PRECAUTIONS: Fall  PATIENT GOALS: patient unable to state  OBJECTIVE:  Note: Objective measures were completed at Evaluation unless otherwise noted.  DIAGNOSTIC FINDINGS: 04/29/23 brain MRI IMPRESSION: Aging brain without acute or reversible finding.                                                                                                                             TREATMENT  Theract: -assessed BP and elevated, but within appropriate range for PT  -needed to use manual cuff as automatic was pumping up to 275, but not producing a reading -nustep level 3 x10 mins B UE/LE for improved CV endurance -static standing balance on foam in // bars normal BOS-> NBOS   -progressed to EC (increased posterior right LOB with delayed reaction to regain balance by grabbing bars)   -progressed to small head movements with overall increase in postural sway -standing normal BOS on foam lateral transfer of cones *required CGA for all balance tasks  PATIENT EDUCATION: Education details: safe BP parameters, continue HEP Person educated: Patient and Child(ren) Education method: Explanation and Handouts Education comprehension: verbalized understanding and needs further education  HOME EXERCISE PROGRAM: Safety Precautions:  Eat enough healthy food Make sure you're drinking enough  fluids (low sugar!) Move slowly (sitting to standing, laying down to sitting, etc) Scan your environment for obstacles (curbs, changes in floor surfaces) Use your cane or walker AT ALL TIMES! If you feel funny/dizzy/"off" call your family and/or 911  Access Code: [email or web address] URL: [email or web address] Date: 05/20/2023 Prepared by: Nickola Baron  Exercises - Sit to Stand with Counter Support  - 1 x daily - 7 x weekly - 3 sets - 10 reps - Supine Bridge  - 1 x daily - 7 x weekly - 3 sets - 10 reps - Seated March  - 1 x daily - 7 x weekly - 3 sets - 10 reps - Seated Heel Raise  - 1 x daily - 7 x weekly - 3 sets - 10 reps - Seated Hip Abduction with Resistance  - 1 x daily - 7 x weekly - 3 sets - 10 reps - Seated Heel Slide  - 1 x daily - 7 x weekly - 3 sets - 10 reps GOALS: Goals reviewed with patient? Yes  SHORT TERM GOALS: = LTG based on PT POC length   LONG TERM GOALS: Target date: 06/19/23  Pt will be compliant  with final HEP with assist as needed to improve balance and functional strength  Baseline: to be provided Goal status: INITIAL  2.  Patient will complete >/= 30s on condition 1 and 2 of MCTSIB to demonstrate improved balance on solid surfaces Baseline: 15s and 25s Goal status: REVISED  3.  Patient and family will report >90% compliance with AD at home to reduce risk for falling Baseline: ~50% per patient and family  Goal status: INITIAL  ASSESSMENT:  CLINICAL IMPRESSION: Patient seen for skilled PT session with emphasis on progressing CV endurance and balance. Session initially limited by patients elevated BP, though it did remain within appropriate parameters. Does demonstrate overall reduced postural control and impaired balance on compliant surfaces, predominantly with NBOS and EC. He is able to regain his balance without PT support, but does require external assist from // bars and it is delayed allowing for greater excursion of his weight outside his BOS. Continue POC.   OBJECTIVE IMPAIRMENTS: Abnormal gait, cardiopulmonary status limiting activity, decreased balance, decreased endurance, decreased knowledge of condition, decreased knowledge of use of DME, decreased strength, decreased safety awareness, dizziness, impaired perceived functional ability, impaired sensation, and impaired vision/preception.   ACTIVITY LIMITATIONS: stairs, bed mobility, hygiene/grooming, and locomotion level  PARTICIPATION LIMITATIONS: meal prep, cleaning, interpersonal relationship, driving, shopping, and community activity  PERSONAL FACTORS: Age, Fitness, Past/current experiences, Social background, and Time since onset of injury/illness/exacerbation are also affecting patient's functional outcome.   REHAB POTENTIAL: Fair etiology  CLINICAL DECISION MAKING: Stable/uncomplicated  EVALUATION COMPLEXITY: Low   PLAN:  PT FREQUENCY: 1x/week per patient and family request  PT DURATION: 4  weeks  PLANNED INTERVENTIONS: 97164- PT Re-evaluation, 97110-Therapeutic exercises, 97530- Therapeutic activity, 97112- Neuromuscular re-education, [phone]- Self Care, 13086- Manual therapy, [phone]- Gait training, [phone]- Orthotic Fit/training, [phone]- Canalith repositioning, [phone]- Aquatic Therapy, Patient/Family education, Balance training, Stair training, Vestibular training, Visual/preceptual remediation/compensation, Cognitive remediation, and DME instructions  PLAN FOR NEXT SESSION: functional strength, balance EO/EC on solid surfaces first, CHECK BP   Rebecca Campus, PT Rebecca Campus, PT, DPT, CBIS  05/27/2023, 11:54 AM

## 2023-05-28 ENCOUNTER — Ambulatory Visit
Admission: RE | Admit: 2023-05-28 | Discharge: 2023-05-28 | Discharge status: Home / Self Care | Source: Ambulatory Visit | Attending: Neurology | Admitting: Neurology

## 2023-05-28 MED ORDER — IOPAMIDOL (ISOVUE-370) INJECTION 76%
500.0000 mL | Freq: Once | INTRAVENOUS | Status: AC | PRN
Start: 2023-05-28 — End: 2023-05-28
  Administered 2023-05-28: 75 mL via INTRAVENOUS

## 2023-06-03 ENCOUNTER — Ambulatory Visit

## 2023-06-03 VITALS — BP 166/77 | HR 63

## 2023-06-03 DIAGNOSIS — R2681 Unsteadiness on feet: Secondary | ICD-10-CM

## 2023-06-03 DIAGNOSIS — M6281 Muscle weakness (generalized): Secondary | ICD-10-CM

## 2023-06-03 DIAGNOSIS — R42 Dizziness and giddiness: Secondary | ICD-10-CM | POA: Diagnosis not present

## 2023-06-03 NOTE — Therapy (Signed)
 OUTPATIENT PHYSICAL THERAPY VESTIBULAR TREATMENT     Patient Name: [name] [name] MRN: [id number] DOB:09/21/1931, 89 y.o., male Today's Date: 06/03/2023  END OF SESSION:  PT End of Session - 06/03/23 1057     Visit Number 4    Number of Visits 5    Date for PT Re-Evaluation 06/19/23    Authorization Type [employer] MCR    PT Start Time 1056    PT Stop Time 1137    PT Time Calculation (min) 41 min    Equipment Utilized During Treatment Gait belt    Activity Tolerance Patient tolerated treatment well    Behavior During Therapy WFL for tasks assessed/performed             Past Medical History:  Diagnosis Date   Benign prostatic hyperplasia (BPH) with urinary urge incontinence 11/13/2017   Chronic left shoulder pain 11/13/2017   Chronic non-seasonal allergic rhinitis 02/17/2006   Chronic systolic heart failure (HCC) 10/31/2016   Echo (05/03/2015): LVEF 45-50%, grade 2 diastolic dysfunction   Coronary artery disease involving native coronary artery of native heart without angina pectoris 09/30/2007   s/p CABG on 01/05/2013: left internal mammary artery to left anterior descending, saphenous vein graft to obtuse marginal 1, sequential saphenous vein graft to acute marginal and posterior descending   Essential hypertension 11/04/2005   Gastroesophageal reflux disease with hiatal hernia 11/04/2005   History of prostate cancer 02/17/2006   Diagnosed 1995, s/p seed implantation in the late 1990's, PSA undetectable 04/10/2016   Hyperlipidemia 11/04/2005   Obesity (BMI 30.0-34.9) 11/13/2017   Peripheral arterial disease (HCC) 09/30/2007   with claudication, failed attempt at LLE PTCA   Type 2 diabetes mellitus with microalbuminuria, with long-term current use of insulin  (HCC) 11/13/2017   Type 2 diabetes mellitus with mild 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, bilateral (HCC) 03/24/2018   Type 2 diabetes mellitus with peripheral neuropathy (HCC) 11/04/2005   Urethral  stricture in male 11/13/2017   Noted on cystoscopy 12/18/2014.  Reportedly asked to do chronic intermittent self catheterizations, but has not.   Past Surgical History:  Procedure Laterality Date   BREAST SURGERY     CARDIAC CATHETERIZATION  10/05/2008   REVEALS HYPOKINESIS OF THE LATERAL WALL AND EF 50-55%   CORONARY ANGIOPLASTY WITH STENT PLACEMENT     CORONARY ARTERY BYPASS GRAFT N/A 01/05/2013   Procedure: CORONARY ARTERY BYPASS GRAFTING (CABG);  Surgeon: Zelphia Higashi, MD;  Location: [facility] OR;  Service: Open Heart Surgery;  Laterality: N/A;   INTRAOPERATIVE TRANSESOPHAGEAL ECHOCARDIOGRAM N/A 01/05/2013   Procedure: INTRAOPERATIVE TRANSESOPHAGEAL ECHOCARDIOGRAM;  Surgeon: Zelphia Higashi, MD;  Location: [facility] OR;  Service: Open Heart Surgery;  Laterality: N/A;   LEFT HEART CATHETERIZATION WITH CORONARY ANGIOGRAM N/A 01/03/2013   Procedure: LEFT HEART CATHETERIZATION WITH CORONARY ANGIOGRAM;  Surgeon: Avanell Leigh, MD;  Location: [facility] C[facility] LAB;  Service: Cardiovascular;  Laterality: N/A;   LOWER EXTREMITY ANGIOGRAM Right 12/07/2012   unsuccessful attempt at percutaneous revascularization of a calcified long segment chronic total occlusion mid left SFA /notes 12/07/2012   LOWER EXTREMITY ANGIOGRAM N/A 12/07/2012   Procedure: LOWER EXTREMITY ANGIOGRAM;  Surgeon: Avanell Leigh, MD;  Location: [facility] CATH LAB;  Service: Cardiovascular;  Laterality: N/A;   PROSTATE SURGERY  10/04/1988   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) 12/19/2020   Mild cognitive  impairment 06/25/2020   Esophageal dysphagia 04/24/2020   Chronic gout 05/27/2018   Constipation 03/26/2018   Type 2 diabetes mellitus with mild 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, bilateral (HCC) 03/24/2018   Healthcare maintenance 11/13/2017    Chronic left shoulder pain 11/13/2017   Severe obesity (BMI 35.0-35.9 with comorbidity) (HCC) 11/13/2017   Urethral stricture in male 11/13/2017   Benign prostatic hyperplasia (BPH) with urinary urge incontinence 11/13/2017   Chronic combined systolic (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) 10/31/2016   Arthritis 12/19/2014   Coronary artery disease involving native coronary artery of native heart without angina pectoris 09/30/2007   Peripheral arterial disease (HCC) 09/30/2007   Chronic non-seasonal allergic rhinitis 02/17/2006   History of prostate cancer 02/17/2006   Hyperlipidemia 11/04/2005   Essential hypertension 11/04/2005   Gastroesophageal reflux disease with hiatal hernia 11/04/2005    PCP: Adeline Hone, MD REFERRING PROVIDER: Ardella Beaver, MD  REFERRING DIAG:  R42 (ICD-10-CM) - Dizziness  R26.81 (ICD-10-CM) - Gait instability    THERAPY DIAG:  Dizziness and giddiness  Unsteadiness on feet  Muscle weakness (generalized)  ONSET DATE: 05/04/23 referral  Rationale for Evaluation and Treatment: Rehabilitation  SUBJECTIVE:   SUBJECTIVE STATEMENT: Patient reports doing well. Does endorse mild R knee pain. Denies falls. Reports that he's still using his cane 100% of the time.  Pt accompanied by: family member  PERTINENT HISTORY: CHF, CAD, HTN, GERD, h/o prostate CA, HLD, PAD, DM2,   PAIN:  Are you having pain? No  PRECAUTIONS: Fall  PATIENT GOALS: patient unable to state  OBJECTIVE:  Note: Objective measures were completed at Evaluation unless otherwise noted.  DIAGNOSTIC FINDINGS: 04/29/23 brain MRI IMPRESSION: Aging brain without acute or reversible finding.                                                                                                                            TREATMENT  Theract: -assessed BP and elevated, but within appropriate range for PT -scifit level 3 x10 mins B UE/LE for improved CV endurance -sit <>  stand on Airex + CGA  -standing toe taps to 2" step with U UE support on SPC + CGA -modified SLS with U LE on 2" step + 2lb DB punch + CGA -supine hooklying bridges  -supine LTR  PATIENT EDUCATION: Education details: continue HEP, PT POC Person educated: Patient and Child(ren) Education method: Explanation and Handouts Education comprehension: verbalized understanding and needs further education  HOME EXERCISE PROGRAM: Safety Precautions:  Eat enough healthy food Make sure you're drinking enough fluids (low sugar!) Move slowly (sitting to standing, laying down to sitting, etc) Scan your environment for obstacles (curbs, changes in floor surfaces) Use your cane or walker AT ALL TIMES! If you feel funny/dizzy/"off" call your family and/or 911  Access Code: [email or web address] URL: [email or web address] Date: 05/20/2023 Prepared by: Nickola Baron  Exercises - Sit to Stand with Counter Support  - 1 x daily - 7 x weekly -  3 sets - 10 reps - Supine Bridge  - 1 x daily - 7 x weekly - 3 sets - 10 reps - Seated March  - 1 x daily - 7 x weekly - 3 sets - 10 reps - Seated Heel Raise  - 1 x daily - 7 x weekly - 3 sets - 10 reps - Seated Hip Abduction with Resistance  - 1 x daily - 7 x weekly - 3 sets - 10 reps - Seated Heel Slide  - 1 x daily - 7 x weekly - 3 sets - 10 reps GOALS: Goals reviewed with patient? Yes  SHORT TERM GOALS: = LTG based on PT POC length   LONG TERM GOALS: Target date: 06/19/23  Pt will be compliant with final HEP with assist as needed to improve balance and functional strength  Baseline: to be provided Goal status: INITIAL  2.  Patient will complete >/= 30s on condition 1 and 2 of MCTSIB to demonstrate improved balance on solid surfaces Baseline: 15s and 25s Goal status: REVISED  3.  Patient and family will report >90% compliance with AD at home to reduce risk for falling Baseline: ~50% per patient and family  Goal status:  INITIAL  ASSESSMENT:  CLINICAL IMPRESSION: Patient seen for skilled PT session with emphasis on progressing functional strength and endurance. BP remains slightly elevated, but still within appropriate therapeutic range. He continues to have primary LOB posteriorly and requires external support to regain balance. Continue POC.   OBJECTIVE IMPAIRMENTS: Abnormal gait, cardiopulmonary status limiting activity, decreased balance, decreased endurance, decreased knowledge of condition, decreased knowledge of use of DME, decreased strength, decreased safety awareness, dizziness, impaired perceived functional ability, impaired sensation, and impaired vision/preception.   ACTIVITY LIMITATIONS: stairs, bed mobility, hygiene/grooming, and locomotion level  PARTICIPATION LIMITATIONS: meal prep, cleaning, interpersonal relationship, driving, shopping, and community activity  PERSONAL FACTORS: Age, Fitness, Past/current experiences, Social background, and Time since onset of injury/illness/exacerbation are also affecting patient's functional outcome.   REHAB POTENTIAL: Fair etiology  CLINICAL DECISION MAKING: Stable/uncomplicated  EVALUATION COMPLEXITY: Low   PLAN:  PT FREQUENCY: 1x/week per patient and family request  PT DURATION: 4 weeks  PLANNED INTERVENTIONS: 97164- PT Re-evaluation, 97110-Therapeutic exercises, 97530- Therapeutic activity, 97112- Neuromuscular re-education, [phone]- Self Care, 69629- Manual therapy, [phone]- Gait training, [phone]- Orthotic Fit/training, [phone]- Canalith repositioning, [phone]- Aquatic Therapy, Patient/Family education, Balance training, Stair training, Vestibular training, Visual/preceptual remediation/compensation, Cognitive remediation, and DME instructions  PLAN FOR NEXT SESSION: functional strength, balance EO/EC on solid surfaces first, CHECK BP   Rebecca Campus, PT Rebecca Campus, PT, DPT, CBIS  06/03/2023, 12:05 PM

## 2023-06-10 ENCOUNTER — Ambulatory Visit: Attending: Neurology

## 2023-06-16 ENCOUNTER — Ambulatory Visit: Payer: Medicare PPO | Admitting: Endocrinology

## 2023-06-16 ENCOUNTER — Ambulatory Visit: Payer: Self-pay | Admitting: Endocrinology

## 2023-06-16 ENCOUNTER — Telehealth: Payer: Self-pay | Admitting: Adult Health

## 2023-06-16 ENCOUNTER — Encounter: Payer: Self-pay | Admitting: Endocrinology

## 2023-06-16 VITALS — BP 132/80 | HR 63 | Resp 20 | Ht 73.0 in | Wt 248.2 lb

## 2023-06-16 DIAGNOSIS — E1142 Type 2 diabetes mellitus with diabetic polyneuropathy: Secondary | ICD-10-CM | POA: Diagnosis not present

## 2023-06-16 DIAGNOSIS — Z794 Long term (current) use of insulin: Secondary | ICD-10-CM

## 2023-06-16 LAB — POCT GLYCOSYLATED HEMOGLOBIN (HGB A1C): Hemoglobin A1C: 7.5 % — AB (ref 4.0–5.6)

## 2023-06-16 NOTE — Patient Instructions (Signed)
 Bring glucometer in follow up visit.   Follow up in 4 months.

## 2023-06-16 NOTE — Progress Notes (Signed)
 Outpatient Endocrinology Note [place] Phoebe Marter, MD  06/16/23  Patient's Name: [name] [name]    DOB: 11-29-1931    MRN: [id number]                                                    REASON OF VISIT: Follow up for type 2 diabetes mellitus  PCP: Ulysees Gander, MD  HISTORY OF PRESENT ILLNESS:   [name] [name] is a 89 y.o. old male with past medical history listed below, is here for follow up of type 2 diabetes mellitus.   Pertinent Diabetes History: Patient was diagnosed with type 2 diabetes mellitus in 1990.  He has been on insulin  therapy since 2005.  He was on NPH and regular insulin  in the past and also basal bolus regimen.  He had usually controlled diabetes mellitus with hemoglobin A1c near 7% in the past.  Chronic Diabetes Complications : Retinopathy: yes. Following with ophthalmology. Nephropathy: CKD, on losartan  Peripheral neuropathy: yes, on gabapentin  Coronary artery disease: yes, s/p CABG Stroke: no  Relevant comorbidities and cardiovascular risk factors: Obesity: no Body mass index is 32.75 kg/m.  Hypertension: yes Hyperlipidemia. Yes, on statin.   Current / Home Diabetic regimen includes: Metformin  ER 1000 mg in the morning and 500 mg in the evening. Lantus  12 units at bedtime. Humalog  5 units with meals 3 times a day / morning and in the afternoon.   Prior diabetic medications: NPH and regular insulin   Glycemic data:   Forget to bring glucometer in the clinic today.  Hypoglycemia: Patient has no hypoglycemic episodes. Patient has hypoglycemia awareness.  Factors modifying glucose control: 1.  Diabetic diet assessment: 3 meals a day.  2.  Staying active or exercising: No formal exercise.  3.  Medication compliance: compliant most of the time.  Interval history  Globin A1c today 7.5%.  Diabetes regimen reviewed and as noted above.  Patient is accompanied by daughter from [name] in the clinic today.  Reports no hypoglycemic symptoms.  No glucose  data to review.  No other complaints today.  Patient reports he had noticed small amount of blood 3 months ago in the urine, 1 time.  Patient is asked to pay attention to the urine if he noticed any blood asked to discuss with primary care provider to check further at that time.  REVIEW OF SYSTEMS As per history of present illness.   PAST MEDICAL HISTORY: Past Medical History:  Diagnosis Date   Benign prostatic hyperplasia (BPH) with urinary urge incontinence 11/13/2017   Chronic left shoulder pain 11/13/2017   Chronic non-seasonal allergic rhinitis 02/17/2006   Chronic systolic heart failure (HCC) 10/31/2016   Echo (05/03/2015): LVEF 45-50%, grade 2 diastolic dysfunction   Coronary artery disease involving native coronary artery of native heart without angina pectoris 09/30/2007   s/p CABG on 01/05/2013: left internal mammary artery to left anterior descending, saphenous vein graft to obtuse marginal 1, sequential saphenous vein graft to acute marginal and posterior descending   Essential hypertension 11/04/2005   Gastroesophageal reflux disease with hiatal hernia 11/04/2005   History of prostate cancer 02/17/2006   Diagnosed 1995, s/p seed implantation in the late 1990's, PSA undetectable 04/10/2016   Hyperlipidemia 11/04/2005   Obesity (BMI 30.0-34.9) 11/13/2017   Peripheral arterial disease (HCC) 09/30/2007   with claudication, failed attempt at LLE PTCA  Type 2 diabetes mellitus with microalbuminuria, with long-term current use of insulin  (HCC) 11/13/2017   Type 2 diabetes mellitus with mild 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, bilateral (HCC) 03/24/2018   Type 2 diabetes mellitus with peripheral neuropathy (HCC) 11/04/2005   Urethral stricture in male 11/13/2017   Noted on cystoscopy 12/18/2014.  Reportedly asked to do chronic intermittent self catheterizations, but has not.    PAST SURGICAL HISTORY: Past Surgical History:  Procedure Laterality Date   BREAST  SURGERY     CARDIAC CATHETERIZATION  10/05/2008   REVEALS HYPOKINESIS OF THE LATERAL WALL AND EF 50-55%   CORONARY ANGIOPLASTY WITH STENT PLACEMENT     CORONARY ARTERY BYPASS GRAFT N/A 01/05/2013   Procedure: CORONARY ARTERY BYPASS GRAFTING (CABG);  Surgeon: Zelphia Higashi, MD;  Location: [facility] OR;  Service: Open Heart Surgery;  Laterality: N/A;   INTRAOPERATIVE TRANSESOPHAGEAL ECHOCARDIOGRAM N/A 01/05/2013   Procedure: INTRAOPERATIVE TRANSESOPHAGEAL ECHOCARDIOGRAM;  Surgeon: Zelphia Higashi, MD;  Location: [facility] OR;  Service: Open Heart Surgery;  Laterality: N/A;   LEFT HEART CATHETERIZATION WITH CORONARY ANGIOGRAM N/A 01/03/2013   Procedure: LEFT HEART CATHETERIZATION WITH CORONARY ANGIOGRAM;  Surgeon: Avanell Leigh, MD;  Location: [facility] CATH LAB;  Service: Cardiovascular;  Laterality: N/A;   LOWER EXTREMITY ANGIOGRAM Right 12/07/2012   unsuccessful attempt at percutaneous revascularization of a calcified long segment chronic total occlusion mid left SFA /notes 12/07/2012   LOWER EXTREMITY ANGIOGRAM N/A 12/07/2012   Procedure: LOWER EXTREMITY ANGIOGRAM;  Surgeon: Avanell Leigh, MD;  Location: [facility] CATH LAB;  Service: Cardiovascular;  Laterality: N/A;   PROSTATE SURGERY  10/04/1988    ALLERGIES: No Known Allergies  FAMILY HISTORY:  Family History  Problem Relation Age of Onset   Kidney failure Mother    Hypertension Father    Stroke Father    Other Sister    Other Brother    Other Brother    Other Brother    Other Brother    Other Brother    Other Brother    Other Brother    Other Brother    Healthy Daughter    Healthy Daughter    Healthy Daughter    Healthy Son     SOCIAL HISTORY: Social History   Socioeconomic History   Marital status: Married    Spouse name: Not on file   Number of children: 4   Years of education: 12   Highest education level: 12th grade  Occupational History   Occupation: [employer] doctor    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  Tobacco  Use   Smoking status: Former    Current packs/day: 0.00    Average packs/day: 0.8 packs/day for 20.0 years (15.0 ttl pk-yrs)    Types: Cigarettes    Start date: 07/08/1960    Quit date: 07/08/1980    Years since quitting: 42.9   Smokeless tobacco: Never  Vaping Use   Vaping status: Never Used  Substance and Sexual Activity   Alcohol use: No    Alcohol/week: 0.0 standard drinks of alcohol    Comment: 12/07/2012 "quit drinking alcohol > 25 yr ago"   Drug use: No   Sexual activity: Not Currently  Other Topics Concern   Not on file  Social History Narrative   Occupation: Optician, dispensing   Married   Social Drivers of Corporate investment banker Strain: Low Risk  (10/21/2022)   Received from [facility]   Overall Financial Resource Strain (CARDIA)    Difficulty of Paying  Living Expenses: Not very hard  Food Insecurity: No Food Insecurity (10/21/2022)   [facility] from [facility]   Hunger Vital Sign    Worried About Running Out of Food in the Last Year: Never true    Ran Out of Food in the Last Year: Never true  Transportation Needs: No Transportation Needs (10/21/2022)   Received from [facility]   PRAPARE - Transportation    Lack of Transportation (Medical): No    Lack of Transportation (Non-Medical): No  Physical Activity: Inactive (10/09/2021)   Exercise Vital Sign    Days of Exercise per Week: 0 days    Minutes of Exercise per Session: 0 min  Stress: No Stress Concern Present (10/09/2021)   [employer] of Occupational Health - Occupational Stress Questionnaire    Feeling of Stress : Not at all  Social Connections: Socially Integrated (10/09/2021)   Social Connection and Isolation Panel [NHANES]    Frequency of Communication with Friends and Family: Twice a week    Frequency of Social Gatherings with Friends and Family: Twice a week    Attends Religious Services: More than 4 times per year    Active Member of [employer] or Organizations: Yes    Attends Engineer, structural:  More than 4 times per year    Marital Status: Married    MEDICATIONS:  Current Outpatient Medications  Medication Sig Dispense Refill   allopurinol  (ZYLOPRIM ) 100 MG tablet Take 2 tablets by mouth once daily 180 tablet 0   amLODipine  (NORVASC ) 2.5 MG tablet Take 1 tablet (2.5 mg total) by mouth daily. 90 tablet 3   atorvastatin  (LIPITOR) 40 MG tablet Take 1 tablet (40 mg total) by mouth daily. 90 tablet 3   blood glucose meter kit and supplies KIT Dispense based on patient and insurance preference. Use up to four times daily as directed. 1 each 0   colchicine  0.6 MG tablet Take 1 tablet (0.6 mg total) by mouth 2 (two) times daily as needed (gout flare). 60 tablet 1   gabapentin  (NEURONTIN ) 100 MG capsule Take 100 mg by mouth 2 (two) times daily. Pt takes 2 tablet 200 mg bid     insulin  glargine (LANTUS ) 100 UNIT/ML injection Inject 0.12 mLs (12 Units total) into the skin daily. 10 mL 11   insulin  lispro (HUMALOG ) 100 UNIT/ML injection INJECT 5 UNITS INTO THE SKIN 3 TIMES PER DAY BEFORE EACH MEAL 10 mL 11   losartan  (COZAAR ) 25 MG tablet Take 1 tablet (25 mg total) by mouth daily. 90 tablet 3   metFORMIN  (GLUCOPHAGE ) 500 MG tablet Take 2 tablets in the morning and 1 tablet in the evening. 270 tablet 3   metoprolol  succinate (TOPROL  XL) 50 MG 24 hr tablet Take 1 tablet (50 mg total) by mouth daily. Take with or immediately following a meal. 90 tablet 3   metoprolol  tartrate (LOPRESSOR ) 25 MG tablet Take 2 tablets (50 mg total) by mouth 2 (two) times daily. 180 tablet 1   nitroGLYCERIN  (NITROSTAT ) 0.4 MG SL tablet Place 1 tablet (0.4 mg total) under the tongue every 5 (five) minutes as needed for chest pain. 25 tablet 3   pantoprazole  (PROTONIX ) 20 MG tablet Take 20 mg by mouth as needed for heartburn.     Push Button Safety Lancets 28G MISC 1 Lancet by Does not apply route 3 (three) times daily. 300 each 3   tamsulosin  (FLOMAX ) 0.4 MG CAPS capsule Take 1 capsule (0.4 mg total) by mouth daily. 90  capsule  3   trospium (SANCTURA) 20 MG tablet Take 20 mg by mouth as needed.     cetirizine  (ZYRTEC  ALLERGY) 10 MG tablet Take 0.5 tablets (5 mg total) by mouth daily. (Patient taking differently: Take 5 mg by mouth as needed for allergies.) 15 tablet 0   Current Facility-Administered Medications  Medication Dose Route Frequency Provider Last Rate Last Admin   Insulin  Pen Needle (NOVOFINE) 100 each  10 packet Subcutaneous TID Lajean Pike, MD        PHYSICAL EXAM: Vitals:   06/16/23 1348  BP: 132/80  Pulse: 63  Resp: 20  SpO2: 99%  Weight: 248 lb 3.2 oz (112.6 kg)  Height: 6\' 1"  (1.854 m)      Body mass index is 32.75 kg/m.  Wt Readings from Last 3 Encounters:  06/16/23 248 lb 3.2 oz (112.6 kg)  05/04/23 242 lb (109.8 kg)  04/20/23 239 lb (108.4 kg)    General: Well developed, well nourished male in no apparent distress.  HEENT: AT/[place], no external lesions.  Eyes: Conjunctiva clear and no icterus. Neck: Neck supple  Lungs: Respirations not labored Neurologic: Alert, oriented, normal speech Extremities / Skin: Dry.   Psychiatric: Does not appear depressed or anxious  Diabetic Foot Exam - Simple   No data filed      LABS Reviewed Lab Results  Component Value Date   HGBA1C 7.5 (A) 06/16/2023   HGBA1C 7.2 (A) 03/16/2023   HGBA1C 7.4 (A) 12/11/2022   Lab Results  Component Value Date   FRUCTOSAMINE 312 (H) 11/28/2022   FRUCTOSAMINE 320 (H) 09/01/2022   Lab Results  Component Value Date   CHOL 93 (L) 12/09/2022   HDL 39 (L) 12/09/2022   LDLCALC 41 12/09/2022   TRIG 55 12/09/2022   CHOLHDL 2.4 12/09/2022   Lab Results  Component Value Date   MICRALBCREAT 260 (H) 03/16/2023   MICRALBCREAT 5.4 02/04/2022   Lab Results  Component Value Date   CREATININE 1.22 04/29/2023   Lab Results  Component Value Date   GFR 60.32 11/28/2022    ASSESSMENT / PLAN  1. Type 2 diabetes mellitus with diabetic polyneuropathy, with long-term current use of insulin  (HCC)       Diabetes Mellitus type 2, complicated by diabetic neuropathy /retinopathy/CKD - Diabetic status / severity: fair controlled.   Lab Results  Component Value Date   HGBA1C 7.5 (A) 06/16/2023    - Hemoglobin A1c goal : <7.5%  Patient has fair control of diabetes mellitus.  - Medications: See below no change.  Continue Lantus  12 units daily.  Humalog  5 units with breakfast at 8 or 9 AM, 5 units with lunch at 1 or 2 PM and 5 units with supper at 5 or 6 PM with supper.   Metformin  1000 mg the morning and 500 mg in the evening.  - Home glucose testing: Before meals and at bedtime.  Asked to bring glucometer in follow-up visit. - Discussed/ Gave Hypoglycemia treatment plan.  # Consult : not required at this time.   # Annual urine for microalbuminuria/ creatinine ratio, + microalbuminuria currently, continue ACE/ARB /losartan .  Last  Lab Results  Component Value Date   MICRALBCREAT 260 (H) 03/16/2023    # Foot check nightly / neuropathy, continue gabapentin , managed by primary care provider.  # Annual dilated diabetic eye exams.  Follow-up with ophthalmology.  2. Blood pressure  -  BP Readings from Last 1 Encounters:  06/16/23 132/80    - Control is in target.  -  No change in current plans.  3. Lipid status / Hyperlipidemia - Last  Lab Results  Component Value Date   LDLCALC 41 12/09/2022   - Continue atorvastatin  40 mg daily.  Diagnoses and all orders for this visit:  Type 2 diabetes mellitus with diabetic polyneuropathy, with long-term current use of insulin  (HCC) -     POCT glycosylated hemoglobin (Hb A1C)     DISPOSITION Follow up in clinic in 4 month suggested.   All questions answered and patient verbalized understanding of the plan.  [place] Rogerick Baldwin, MD [facility] Endocrinology [facility] [address] [place], [address] [place], [place] [address] Phone # [phone]  At least part of this note was generated using voice recognition  software. Inadvertent word errors may have occurred, which were not recognized during the proofreading process.

## 2023-07-14 ENCOUNTER — Telehealth: Payer: Self-pay | Admitting: Endocrinology

## 2023-07-14 ENCOUNTER — Other Ambulatory Visit: Payer: Self-pay

## 2023-07-14 NOTE — Telephone Encounter (Signed)
 Pt stopped by seems confused. He said he thought Thapa was sending in something at his last visit. He is unsure of what that could be. He states he does not have a phone but could call his sister.

## 2023-07-14 NOTE — Telephone Encounter (Signed)
 Spoke with patient's daughter stated she was not sure what he was referring to as  MD was not sending anything over. Stated he has everything he needs but he is running low on his strips. Daughter to call back to tell us  what strips is needed.

## 2023-07-16 ENCOUNTER — Ambulatory Visit (HOSPITAL_COMMUNITY)
Admission: EM | Admit: 2023-07-16 | Discharge: 2023-07-16 | Disposition: A | Discharge status: Home / Self Care | Attending: Internal Medicine | Admitting: Internal Medicine

## 2023-07-16 ENCOUNTER — Encounter (HOSPITAL_COMMUNITY): Payer: Self-pay

## 2023-07-16 ENCOUNTER — Telehealth: Payer: Self-pay | Admitting: Dietician

## 2023-07-16 DIAGNOSIS — N390 Urinary tract infection, site not specified: Secondary | ICD-10-CM

## 2023-07-16 LAB — POCT URINALYSIS DIP (MANUAL ENTRY)
Bilirubin, UA: NEGATIVE
Glucose, UA: NEGATIVE mg/dL
Ketones, POC UA: NEGATIVE mg/dL
Nitrite, UA: NEGATIVE
Protein Ur, POC: 100 mg/dL — AB
Spec Grav, UA: 1.02 (ref 1.010–1.025)
Urobilinogen, UA: 1 U/dL
pH, UA: 5 (ref 5.0–8.0)

## 2023-07-16 MED ORDER — CEPHALEXIN 500 MG PO CAPS: 500.0000 mg | ORAL_CAPSULE | Freq: Four times a day (QID) | ORAL | 0 refills | Status: AC

## 2023-07-16 NOTE — ED Triage Notes (Signed)
 Pt c/o blood in urine and strong odor x2wks. States had a UTI 6 months ago and tx'd with antibiotics.

## 2023-07-16 NOTE — Discharge Instructions (Addendum)
 Urinalysis does show leukocytes and some blood.  Given the symptoms, this is most likely a urinary tract infection.  We will treat with the following: Cephalexin  500 mg 4 times daily for 5 days. This is an antibiotic. Take this with food. Stay hydrated and drink plenty of water.  Return to urgent care or PCP if symptoms worsen or fail to resolve.

## 2023-07-16 NOTE — Telephone Encounter (Signed)
 Patient's daughter called and stated that her father is at Urgent Care due to blood in his urine and needs a referral to a Urologist.  Discussed that patient's primary MD should make that referral and daughter states that she will follow up with them. She is to call for further questions.  Cydne Doyne, RD, LDN, CDCES, DipACLM

## 2023-07-16 NOTE — ED Provider Notes (Signed)
 [facility]    CSN: [id number] Arrival date & time: 07/16/23  1244      History   Chief Complaint Chief Complaint  Patient presents with   Hematuria    HPI [name]  [name] is a 89 y.o. male.   89 y.o. male who presents to urgent care with complaints of blood in urine and strong odor for about 2 weeks.  He also reports that he has a little bit of residual urine after he pees.  He does have a history of a BPH.  He had a urinary tract infection about 6 months ago with similar symptoms and was prescribed antibiotics with relief of his symptoms.  He denies any fevers or abdominal pain.  He does have a urologist that he is supposed to see in September.   Hematuria Pertinent negatives include no chest pain, no abdominal pain and no shortness of breath.    Past Medical History:  Diagnosis Date   Benign prostatic hyperplasia (BPH) with urinary urge incontinence 11/13/2017   Chronic left shoulder pain 11/13/2017   Chronic non-seasonal allergic rhinitis 02/17/2006   Chronic systolic heart failure (HCC) 10/31/2016   Echo (05/03/2015): LVEF 45-50%, grade 2 diastolic dysfunction   Coronary artery disease involving native coronary artery of native heart without angina pectoris 09/30/2007   s/p CABG on 01/05/2013: left internal mammary artery to left anterior descending, saphenous vein graft to obtuse marginal 1, sequential saphenous vein graft to acute marginal and posterior descending   Essential hypertension 11/04/2005   Gastroesophageal reflux disease with hiatal hernia 11/04/2005   History of prostate cancer 02/17/2006   Diagnosed 1995, s/p seed implantation in the late 1990's, PSA undetectable 04/10/2016   Hyperlipidemia 11/04/2005   Obesity (BMI 30.0-34.9) 11/13/2017   Peripheral arterial disease (HCC) 09/30/2007   with claudication, failed attempt at LLE PTCA   Type 2 diabetes mellitus with microalbuminuria, with long-term current use of insulin  (HCC) 11/13/2017   Type 2  diabetes mellitus with mild 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, bilateral (HCC) 03/24/2018   Type 2 diabetes mellitus with peripheral neuropathy (HCC) 11/04/2005   Urethral stricture in male 11/13/2017   Noted on cystoscopy 12/18/2014.  Reportedly asked to do chronic intermittent self catheterizations, but has not.    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) 12/19/2020   Mild cognitive impairment 06/25/2020   Esophageal dysphagia 04/24/2020   Chronic gout 05/27/2018   Constipation 03/26/2018   Type 2 diabetes mellitus with mild 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, bilateral (HCC) 03/24/2018   Healthcare maintenance 11/13/2017   Chronic left shoulder pain 11/13/2017   Severe obesity (BMI 35.0-35.9 with comorbidity) (HCC) 11/13/2017   Urethral stricture in male 11/13/2017   Benign prostatic hyperplasia (BPH) with urinary urge incontinence 11/13/2017   Chronic combined systolic (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) 10/31/2016   Arthritis 12/19/2014   Coronary artery disease involving native coronary artery of native heart without angina pectoris 09/30/2007   Peripheral arterial disease (HCC) 09/30/2007   Chronic non-seasonal allergic rhinitis 02/17/2006   History of prostate cancer 02/17/2006   Hyperlipidemia 11/04/2005   Essential hypertension 11/04/2005   Gastroesophageal reflux disease with hiatal hernia 11/04/2005    Past Surgical History:  Procedure Laterality Date   BREAST SURGERY     CARDIAC CATHETERIZATION  10/05/2008   REVEALS HYPOKINESIS OF  THE LATERAL WALL AND EF 50-55%   CORONARY ANGIOPLASTY WITH STENT PLACEMENT     CORONARY ARTERY BYPASS GRAFT N/A 01/05/2013   Procedure: CORONARY ARTERY BYPASS GRAFTING (CABG);  Surgeon: Zelphia Higashi,  MD;  Location: [facility] OR;  Service: Open Heart Surgery;  Laterality: N/A;   INTRAOPERATIVE TRANSESOPHAGEAL ECHOCARDIOGRAM N/A 01/05/2013   Procedure: INTRAOPERATIVE TRANSESOPHAGEAL ECHOCARDIOGRAM;  Surgeon: Zelphia Higashi, MD;  Location: [facility] OR;  Service: Open Heart Surgery;  Laterality: N/A;   LEFT HEART CATHETERIZATION WITH CORONARY ANGIOGRAM N/A 01/03/2013   Procedure: LEFT HEART CATHETERIZATION WITH CORONARY ANGIOGRAM;  Surgeon: Avanell Leigh, MD;  Location: [facility] CATH LAB;  Service: Cardiovascular;  Laterality: N/A;   LOWER EXTREMITY ANGIOGRAM Right 12/07/2012   unsuccessful attempt at percutaneous revascularization of a calcified long segment chronic total occlusion mid left SFA /notes 12/07/2012   LOWER EXTREMITY ANGIOGRAM N/A 12/07/2012   Procedure: LOWER EXTREMITY ANGIOGRAM;  Surgeon: Avanell Leigh, MD;  Location: [facility] CATH LAB;  Service: Cardiovascular;  Laterality: N/A;   PROSTATE SURGERY  10/04/1988       Home Medications    Prior to Admission medications   Medication Sig Start Date End Date Taking? Authorizing Provider  cephALEXin  (KEFLEX ) 500 MG capsule Take 1 capsule (500 mg total) by mouth 4 (four) times daily for 5 days. 07/16/23 07/21/23 Yes Deundra Furber A, PA-C  allopurinol  (ZYLOPRIM ) 100 MG tablet Take 2 tablets by mouth once daily 12/10/21   Rosezetta Contras, MD  amLODipine  (NORVASC ) 2.5 MG tablet Take 1 tablet (2.5 mg total) by mouth daily. 12/09/22   Von Grumbling, PA-C  atorvastatin  (LIPITOR) 40 MG tablet Take 1 tablet (40 mg total) by mouth daily. 07/29/22   Nahser, Lela Purple, MD  blood glucose meter kit and supplies KIT Dispense based on patient and insurance preference. Use up to four times daily as directed. 11/08/21   Gawaluck, [place], MD  cetirizine  (ZYRTEC  ALLERGY) 10 MG tablet Take 0.5 tablets (5 mg total) by mouth daily. Patient taking differently: Take 5 mg by mouth as needed for allergies. 10/06/22 12/09/22  Kreg Pesa, PA-C  colchicine  0.6 MG tablet  Take 1 tablet (0.6 mg total) by mouth 2 (two) times daily as needed (gout flare). 09/23/21   Rosezetta Contras, MD  gabapentin  (NEURONTIN ) 100 MG capsule Take 100 mg by mouth 2 (two) times daily. Pt takes 2 tablet 200 mg bid    [provider]  insulin  glargine (LANTUS ) 100 UNIT/ML injection Inject 0.12 mLs (12 Units total) into the skin daily. 12/11/22   Thapa, [place], MD  insulin  lispro (HUMALOG ) 100 UNIT/ML injection INJECT 5 UNITS INTO THE SKIN 3 TIMES PER DAY BEFORE EACH MEAL 12/12/22   Thapa, [place], MD  losartan  (COZAAR ) 25 MG tablet Take 1 tablet (25 mg total) by mouth daily. 12/09/22   Von Grumbling, PA-C  metFORMIN  (GLUCOPHAGE ) 500 MG tablet Take 2 tablets in the morning and 1 tablet in the evening. 03/16/23   Thapa, [place], MD  metoprolol  succinate (TOPROL  XL) 50 MG 24 hr tablet Take 1 tablet (50 mg total) by mouth daily. Take with or immediately following a meal. 12/09/22   Von Grumbling, PA-C  metoprolol  tartrate (LOPRESSOR ) 25 MG tablet Take 2 tablets (50 mg total) by mouth 2 (two) times daily. 09/18/21   Jackolyn Masker, MD  nitroGLYCERIN  (NITROSTAT ) 0.4 MG SL tablet Place 1 tablet (0.4 mg total) under the tongue every 5 (five) minutes as needed for chest pain. 09/09/22  Von Grumbling, PA-C  pantoprazole  (PROTONIX ) 20 MG tablet Take 20 mg by mouth as needed for heartburn.    [provider]  Push Button Safety Lancets 28G MISC 1 Lancet by Does not apply route 3 (three) times daily. 04/30/23   Thapa, [place], MD  tamsulosin  (FLOMAX ) 0.4 MG CAPS capsule Take 1 capsule (0.4 mg total) by mouth daily. 09/14/20   Rosezetta Contras, MD  trospium (SANCTURA) 20 MG tablet Take 20 mg by mouth as needed. 05/24/21   [provider]    Family History Family History  Problem Relation Age of Onset   Kidney failure Mother    Hypertension Father    Stroke Father    Other Sister    Other Brother    Other Brother    Other Brother    Other Brother    Other Brother    Other Brother     Other Brother    Other Brother    Healthy Daughter    Healthy Daughter    Healthy Daughter    Healthy Son     Social History Social History   Tobacco Use   Smoking status: Former    Current packs/day: 0.00    Average packs/day: 0.8 packs/day for 20.0 years (15.0 ttl pk-yrs)    Types: Cigarettes    Start date: 07/08/1960    Quit date: 07/08/1980    Years since quitting: 43.0   Smokeless tobacco: Never  Vaping Use   Vaping status: Never Used  Substance Use Topics   Alcohol use: No    Alcohol/week: 0.0 standard drinks of alcohol    Comment: 12/07/2012 quit drinking alcohol > 25 yr ago   Drug use: No     Allergies   Patient has no known allergies.   Review of Systems Review of Systems  Constitutional:  Negative for chills and fever.  HENT:  Negative for ear pain and sore throat.   Eyes:  Negative for pain and visual disturbance.  Respiratory:  Negative for cough and shortness of breath.   Cardiovascular:  Negative for chest pain and palpitations.  Gastrointestinal:  Negative for abdominal pain and vomiting.  Genitourinary:  Positive for hematuria. Negative for dysuria, frequency and urgency.       Odor, some residual after urinating  Musculoskeletal:  Negative for arthralgias and back pain.  Skin:  Negative for color change and rash.  Neurological:  Negative for seizures and syncope.  All other systems reviewed and are negative.    Physical Exam Triage Vital Signs ED Triage Vitals [07/16/23 1306]  Encounter Vitals Group     BP (!) 150/78     Girls Systolic BP Percentile      Girls Diastolic BP Percentile      Boys Systolic BP Percentile      Boys Diastolic BP Percentile      Pulse Rate 72     Resp 18     Temp 97.9 F (36.6 C)     Temp Source Oral     SpO2 95 %     Weight      Height      Head Circumference      Peak Flow      Pain Score 0     Pain Loc      Pain Education      Exclude from Growth Chart    No data found.  Updated Vital Signs BP (!)  150/78 (BP Location: Left Arm)   Pulse  72   Temp 97.9 F (36.6 C) (Oral)   Resp 18   SpO2 95%   Visual Acuity Right Eye Distance:   Left Eye Distance:   Bilateral Distance:    Right Eye Near:   Left Eye Near:    Bilateral Near:     Physical Exam Vitals and nursing note reviewed.  Constitutional:      General: He is not in acute distress.    Appearance: He is well-developed.  HENT:     Head: Normocephalic and atraumatic.     Mouth/Throat:     Mouth: Mucous membranes are moist.   Eyes:     Conjunctiva/sclera: Conjunctivae normal.    Cardiovascular:     Rate and Rhythm: Normal rate and regular rhythm.     Heart sounds: No murmur heard. Pulmonary:     Effort: Pulmonary effort is normal. No respiratory distress.     Breath sounds: Normal breath sounds.  Abdominal:     Palpations: Abdomen is soft.     Tenderness: There is no abdominal tenderness.   Musculoskeletal:        General: No swelling.     Cervical back: Neck supple.   Skin:    General: Skin is warm and dry.     Capillary Refill: Capillary refill takes less than 2 seconds.   Neurological:     General: No focal deficit present.     Mental Status: He is alert.   Psychiatric:        Mood and Affect: Mood normal.      UC Treatments / Results  Labs (all labs ordered are listed, but only abnormal results are displayed) Labs Reviewed  POCT URINALYSIS DIP (MANUAL ENTRY) - Abnormal; Notable for the following components:      Result Value   Color, UA straw (*)    Clarity, UA cloudy (*)    Blood, UA trace-lysed (*)    Protein Ur, POC =100 (*)    Leukocytes, UA Large (3+) (*)    All other components within normal limits    EKG   Radiology No results found.  Procedures Procedures (including critical care time)  Medications Ordered in UC Medications - No data to display  Initial Impression / Assessment and Plan / UC Course  I have reviewed the triage vital signs and the nursing  notes.  Pertinent labs & imaging results that were available during my care of the patient were reviewed by me and considered in my medical decision making (see chart for details).     Lower urinary tract infectious disease   Urinalysis does show leukocytes and some blood.  Given the symptoms, this is most likely a urinary tract infection.  We will treat with the following: Cephalexin  500 mg 4 times daily for 5 days. This worked well for the patient previously and he tolerated it well. Stay hydrated and drink plenty of water.  Return to urgent care or PCP if symptoms worsen or fail to resolve.  Final Clinical Impressions(s) / UC Diagnoses   Final diagnoses:  Lower urinary tract infectious disease     Discharge Instructions      Urinalysis does show leukocytes and some blood.  Given the symptoms, this is most likely a urinary tract infection.  We will treat with the following: Cephalexin  500 mg 4 times daily for 5 days. This is an antibiotic. Take this with food. Stay hydrated and drink plenty of water.  Return to urgent care or PCP if symptoms  worsen or fail to resolve.       ED Prescriptions     Medication Sig Dispense Auth. Provider   cephALEXin  (KEFLEX ) 500 MG capsule Take 1 capsule (500 mg total) by mouth 4 (four) times daily for 5 days. 20 capsule Kreg Pesa, [place]      PDMP not reviewed this encounter.   Kreg Pesa, [place] 07/16/23 1406

## 2023-09-03 ENCOUNTER — Ambulatory Visit: Attending: Cardiology | Admitting: Cardiology

## 2023-09-03 ENCOUNTER — Encounter: Payer: Self-pay | Admitting: Adult Health

## 2023-09-03 ENCOUNTER — Encounter: Payer: Self-pay | Admitting: Cardiology

## 2023-09-03 ENCOUNTER — Ambulatory Visit: Admitting: Adult Health

## 2023-09-03 VITALS — BP 147/78 | HR 62 | Ht 73.0 in | Wt 247.0 lb

## 2023-09-03 VITALS — BP 148/76 | HR 73 | Ht 73.0 in | Wt 246.0 lb

## 2023-09-03 DIAGNOSIS — I420 Dilated cardiomyopathy: Secondary | ICD-10-CM

## 2023-09-03 DIAGNOSIS — E785 Hyperlipidemia, unspecified: Secondary | ICD-10-CM

## 2023-09-03 DIAGNOSIS — I5042 Chronic combined systolic (congestive) and diastolic (congestive) heart failure: Secondary | ICD-10-CM | POA: Diagnosis not present

## 2023-09-03 DIAGNOSIS — I951 Orthostatic hypotension: Secondary | ICD-10-CM

## 2023-09-03 DIAGNOSIS — R42 Dizziness and giddiness: Secondary | ICD-10-CM | POA: Diagnosis not present

## 2023-09-03 DIAGNOSIS — I10 Essential (primary) hypertension: Secondary | ICD-10-CM

## 2023-09-03 DIAGNOSIS — I2510 Atherosclerotic heart disease of native coronary artery without angina pectoris: Secondary | ICD-10-CM

## 2023-09-03 NOTE — Patient Instructions (Addendum)
 You were found to have orthostatic hypotension today which is likely largely contributing to your dizziness  Ensure you are drinking plenty of water, you make position changes slowly and use compression stockings   You are on multiple medications that can also contribute to dizziness - please continue to follow closely with your PCP and cardiology to discuss current medications and possible need of adjustments      Follow up as needed at this time      Orthostatic Hypotension Blood pressure is a measurement of how strongly, or weakly, your circulating blood is pressing against the walls of your arteries. Orthostatic hypotension is a drop in blood pressure that can happen when you change positions, such as when you go from lying down to standing. Arteries are blood vessels that carry blood from your heart throughout your body. When blood pressure is too low, you may not get enough blood to your brain or to the rest of your organs. Orthostatic hypotension can cause light-headedness, sweating, rapid heartbeat, blurred vision, and fainting. These symptoms require further investigation into the cause. What are the causes? Orthostatic hypotension can be caused by many things, including: Sudden changes in posture, such as standing up quickly after you have been sitting or lying down. Loss of blood (anemia) or loss of body fluids (dehydration). Heart problems, neurologic problems, or hormone problems. Pregnancy. Aging. The risk for this condition increases as you get older. Severe infection (sepsis). Certain medicines, such as medicines for high blood pressure or medicines that make the body lose excess fluids (diuretics). What are the signs or symptoms? Symptoms of this condition may include: Weakness, light-headedness, or dizziness. Sweating. Blurred vision. Tiredness (fatigue). Rapid heartbeat. Fainting, in severe cases. How is this diagnosed? This condition is diagnosed based on: Your  symptoms and medical history. Your blood pressure measurements. Your health care provider will check your blood pressure when you are: Lying down. Sitting. Standing. A blood pressure reading is recorded as two numbers, such as 120 over 80 (or 120/80). The first (top) number is called the systolic pressure. It is a measure of the pressure in your arteries as your heart beats. The second (bottom) number is called the diastolic pressure. It is a measure of the pressure in your arteries when your heart relaxes between beats. Blood pressure is measured in a unit called mmHg. Healthy blood pressure for most adults is 120/80 mmHg. Orthostatic hypotension is defined as a 20 mmHg drop in systolic pressure or a 10 mmHg drop in diastolic pressure within 3 minutes of standing. Other information or tests that may be used to diagnose orthostatic hypotension include: Your other vital signs, such as your heart rate and temperature. Blood tests. An electrocardiogram (ECG) or echocardiogram. A Holter monitor. This is a device you wear that records your heart rhythm continuously, usually for 24-48 hours. Tilt table test. For this test, you will be safely secured to a table that moves you from a lying position to an upright position. Your heart rhythm and blood pressure will be monitored during the test. How is this treated? This condition may be treated by: Changing your diet. This may involve eating more salt (sodium) or drinking more water. Changing the dosage of certain medicines you are taking that might be lowering your blood pressure. Correcting the underlying reason for the orthostatic hypotension. Wearing compression stockings. Taking medicines to raise your blood pressure. Avoiding actions that trigger symptoms. Follow these instructions at home: Medicines Take over-the-counter and prescription medicines only as  told by your health care provider. Follow instructions from your health care provider  about changing the dosage of your current medicines, if this applies. Do not stop or adjust any of your medicines on your own. Eating and drinking  Drink enough fluid to keep your urine pale yellow. Eat extra salt only as directed. Do not add extra salt to your diet unless advised by your health care provider. Eat frequent, small meals. Avoid standing up suddenly after eating. General instructions  Get up slowly from lying down or sitting positions. This gives your blood pressure a chance to adjust. Avoid hot showers and excessive heat as directed by your health care provider. Engage in regular physical activity as directed by your health care provider. If you have compression stockings, wear them as told. Keep all follow-up visits. This is important. Contact a health care provider if: You have a fever for more than 2-3 days. You feel more thirsty than usual. You feel dizzy or weak. Get help right away if: You have chest pain. You have a fast or irregular heartbeat. You become sweaty or feel light-headed. You feel short of breath. You faint. You have any symptoms of a stroke. BE FAST is an easy way to remember the main warning signs of a stroke: B - Balance. Signs are dizziness, sudden trouble walking, or loss of balance. E - Eyes. Signs are trouble seeing or a sudden change in vision. F - Face. Signs are sudden weakness or numbness of the face, or the face or eyelid drooping on one side. A - Arms. Signs are weakness or numbness in an arm. This happens suddenly and usually on one side of the body. S - Speech. Signs are sudden trouble speaking, slurred speech, or trouble understanding what people say. T - Time. Time to call emergency services. Write down what time symptoms started. You have other signs of a stroke, such as: A sudden, severe headache with no known cause. Nausea or vomiting. Seizure. These symptoms may represent a serious problem that is an emergency. Do not wait  to see if the symptoms will go away. Get medical help right away. Call your local emergency services (911 in the [place] Do not drive yourself to the hospital. Summary Orthostatic hypotension is a sudden drop in blood pressure. It can cause light-headedness, sweating, rapid heartbeat, blurred vision, and fainting. Orthostatic hypotension can be diagnosed by having your blood pressure taken while lying down, sitting, and then standing. Treatment may involve changing your diet, wearing compression stockings, sitting up slowly, adjusting your medicines, or correcting the underlying reason for the orthostatic hypotension. Get help right away if you have chest pain, a fast or irregular heartbeat, or symptoms of a stroke. This information is not intended to replace advice given to you by your health care provider. Make sure you discuss any questions you have with your health care provider. Document Revised: 04/05/2020 Document Reviewed: 04/05/2020 Elsevier Patient Education  2024 [employer].

## 2023-09-03 NOTE — Addendum Note (Signed)
 Addended by: JANIT GENI CROME on: 09/03/2023 02:53 PM   Modules accepted: Orders

## 2023-09-03 NOTE — Progress Notes (Signed)
 [facility] [address] [place] [address] [phone]       OFFICE CONSULT NOTE  [name] Date of Birth:  [birth year] Medical Record Number:  [id number]   Primary neurologist: Dr. Rosemarie Reason for visit: Dizziness   Chief Complaint  Patient presents with   Dizziness    Rm 3 with daughter  Pt is well and stable, reports ongoing dizziness but has completed PT sessions.  No new concerns.       HPI:  Update 09/03/2023 JM: Patient returns for 88-month follow-up visit accompanied by his daughter. He continues to experience dizziness primarily with position changes such as going from sitting to standing especially if he gets up too quickly.  He did work with PT for 4 sessions but denies any significant benefit.  Reports drinking about 3 to 4 12 ounce water bottles per day. He does have compression socks at home but rarely uses.  Orthostatics today positive, laying BP 142/84, standing BP 100/64 although surprisingly denied dizziness sensation during this time.  He is scheduled for follow-up visit with Dr. Shlomo this afternoon, previously seen by cardiology back in November with concerns of possible medication related dizziness and adjustments made to antihypertensive regimen.  He did complete CTA head/neck back in April but imaging not yet read.  He does endorse neuropathy symptoms likely from underlying diabetes, he is closely followed by endocrinology.      Consult visit 05/04/2023 Dr. Rosemarie:  [name] [name] is a 89 year old African-American male with a past medical history of chronic systolic heart failure, coronary artery disease status post CABG, gastroesophageal flux disease, history of prostate cancer, obesity, hyperlipidemia, peripheral arterial disease, diabetes and urethral stricture.  Patient states he has been having dizziness for the last several weeks.  He describes this as a feeling of imbalance rather than vertigo.  He feels like he is  falling to 1 side but can hold on.  He has had no falls or injuries.  He presented to the ER previously in November 2024 for sudden onset of imbalance when he got out of bed [name] [name] and fell off balance.  His CBG and blood pressure are fine during that admission.  He does admit to feeling of numbness and tingling in his feet and likely having diabetic peripheral neuropathy.  He was seen in the ER on 04/20/2023 for similar symptoms.  His blood sugar was 141 mg percent and blood pressure was normal.  Patient does use a cane he feels the dizziness comes on suddenly is often transient.  He denies any accompanying neurological symptoms in the form of double vision, blurred vision, vertigo, slurred speech or focal extremity weakness or numbness.  He does have significant decreased hearing which has been getting worse over the years.  He is considering a hearing aid but notes gotten 1 yet.  Denies any ringing in his years.  CT scan of the head on 04/19/2023 showed no acute abnormality and possibly right cerebellar stroke however MRI scan did not confirm this and showed only mild generalized atrophy and changes of age-related small vessel disease.  Lipid profile on 12/09/2022 showed LDL-cholesterol to be optimal at 41 mg percent.  Hemoglobin A1c on 03/16/2023 was 7.2.  He also complains of chronic mild headaches intermittently for the last several months.  Feels the headaches are mild and not severe.  They last only few seconds to minutes.  They occur randomly without obvious provocation and he does not take any medications for it.  ROS:   14 system review of systems is positive for those listed in HPI and all other systems negative  PMH:  Past Medical History:  Diagnosis Date   Benign prostatic hyperplasia (BPH) with urinary urge incontinence 11/13/2017   Chronic left shoulder pain 11/13/2017   Chronic non-seasonal allergic rhinitis 02/17/2006   Chronic systolic heart failure (HCC) 10/31/2016   Echo (05/03/2015): LVEF  45-50%, grade 2 diastolic dysfunction   Coronary artery disease involving native coronary artery of native heart without angina pectoris 09/30/2007   s/p CABG on 01/05/2013: left internal mammary artery to left anterior descending, saphenous vein graft to obtuse marginal 1, sequential saphenous vein graft to acute marginal and posterior descending   Essential hypertension 11/04/2005   Gastroesophageal reflux disease with hiatal hernia 11/04/2005   History of prostate cancer 02/17/2006   Diagnosed 1995, s/p seed implantation in the late 1990's, PSA undetectable 04/10/2016   Hyperlipidemia 11/04/2005   Obesity (BMI 30.0-34.9) 11/13/2017   Peripheral arterial disease (HCC) 09/30/2007   with claudication, failed attempt at LLE PTCA   Type 2 diabetes mellitus with microalbuminuria, with long-term current use of insulin  (HCC) 11/13/2017   Type 2 diabetes mellitus with mild 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, bilateral (HCC) 03/24/2018   Type 2 diabetes mellitus with peripheral neuropathy (HCC) 11/04/2005   Urethral stricture in male 11/13/2017   Noted on cystoscopy 12/18/2014.  Reportedly asked to do chronic intermittent self catheterizations, but has not.    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  Tobacco Use   Smoking status: Former    Current packs/day: 0.00    Average packs/day: 0.8 packs/day for 20.0 years (15.0 ttl pk-yrs)    Types: Cigarettes    Start date: 07/08/1960    Quit date: 07/08/1980    Years since quitting: 43.1   Smokeless tobacco: Never  Vaping Use   Vaping status: Never Used  Substance and Sexual Activity   Alcohol use: No    Alcohol/week: 0.0 standard drinks of alcohol    Comment: 12/07/2012 quit  drinking alcohol > 25 yr ago   Drug use: No   Sexual activity: Not Currently  Other Topics Concern   Not on file  Social History Narrative   Occupation: Optician, dispensing   Married   Social Drivers of Corporate investment banker Strain: Low Risk  (10/21/2022)   Received from [facility]   Overall Financial Resource Strain (CARDIA)    Difficulty of Paying Living Expenses: Not very hard  Food Insecurity: No Food Insecurity (10/21/2022)   Received from [facility]   Hunger Vital Sign    Within the past 12 months, you worried that your food would run out before you got the money to buy more.: Never true    Within the past 12 months, the food you bought just didn't last and you didn't have money to get more.: Never true  Transportation Needs: No Transportation Needs (10/21/2022)   Received from [facility] - Transportation    Lack of Transportation (Medical): No    Lack of Transportation (Non-Medical): No  Physical Activity: Inactive (10/09/2021)   Exercise Vital Sign    Days of Exercise per Week:  0 days    Minutes of Exercise per Session: 0 min  Stress: No Stress Concern Present (10/09/2021)   [employer] of Occupational Health - Occupational Stress Questionnaire    Feeling of Stress : Not at all  Social Connections: Socially Integrated (10/09/2021)   Social Connection and Isolation Panel    Frequency of Communication with Friends and Family: Twice a week    Frequency of Social Gatherings with Friends and Family: Twice a week    Attends Religious Services: More than 4 times per year    Active Member of [employer] or Organizations: Yes    Attends Engineer, structural: More than 4 times per year    Marital Status: Married  Catering manager Violence: Not At Risk (10/09/2021)   Humiliation, Afraid, Rape, and Kick questionnaire    Fear of Current or Ex-Partner: No    Emotionally Abused: No    Physically Abused: No    Sexually Abused: No    Medications:   Current  Outpatient Medications on File Prior to Visit  Medication Sig Dispense Refill   allopurinol  (ZYLOPRIM ) 100 MG tablet Take 2 tablets by mouth once daily 180 tablet 0   amLODipine  (NORVASC ) 2.5 MG tablet Take 1 tablet (2.5 mg total) by mouth daily. 90 tablet 3   atorvastatin  (LIPITOR) 40 MG tablet Take 1 tablet (40 mg total) by mouth daily. 90 tablet 3   blood glucose meter kit and supplies KIT Dispense based on patient and insurance preference. Use up to four times daily as directed. 1 each 0   cetirizine  (ZYRTEC  ALLERGY) 10 MG tablet Take 0.5 tablets (5 mg total) by mouth daily. (Patient taking differently: Take 5 mg by mouth as needed for allergies.) 15 tablet 0   colchicine  0.6 MG tablet Take 1 tablet (0.6 mg total) by mouth 2 (two) times daily as needed (gout flare). 60 tablet 1   gabapentin  (NEURONTIN ) 100 MG capsule Take 100 mg by mouth 2 (two) times daily. Pt takes 2 tablet 200 mg bid     insulin  glargine (LANTUS ) 100 UNIT/ML injection Inject 0.12 mLs (12 Units total) into the skin daily. 10 mL 11   insulin  lispro (HUMALOG ) 100 UNIT/ML injection INJECT 5 UNITS INTO THE SKIN 3 TIMES PER DAY BEFORE EACH MEAL 10 mL 11   losartan  (COZAAR ) 25 MG tablet Take 1 tablet (25 mg total) by mouth daily. 90 tablet 3   metFORMIN  (GLUCOPHAGE ) 500 MG tablet Take 2 tablets in the morning and 1 tablet in the evening. 270 tablet 3   metoprolol  succinate (TOPROL  XL) 50 MG 24 hr tablet Take 1 tablet (50 mg total) by mouth daily. Take with or immediately following a meal. 90 tablet 3   metoprolol  tartrate (LOPRESSOR ) 25 MG tablet Take 2 tablets (50 mg total) by mouth 2 (two) times daily. 180 tablet 1   nitroGLYCERIN  (NITROSTAT ) 0.4 MG SL tablet Place 1 tablet (0.4 mg total) under the tongue every 5 (five) minutes as needed for chest pain. 25 tablet 3   pantoprazole  (PROTONIX ) 20 MG tablet Take 20 mg by mouth as needed for heartburn.     Push Button Safety Lancets 28G MISC 1 Lancet by Does not apply route 3 (three)  times daily. 300 each 3   tamsulosin  (FLOMAX ) 0.4 MG CAPS capsule Take 1 capsule (0.4 mg total) by mouth daily. 90 capsule 3   trospium (SANCTURA) 20 MG tablet Take 20 mg by mouth as needed.     Current Facility-Administered Medications on  File Prior to Visit  Medication Dose Route Frequency Provider Last Rate Last Admin   Insulin  Pen Needle (NOVOFINE) 100 each  10 packet Subcutaneous TID Von [place], MD        Allergies:  No Known Allergies  Physical Exam Today's Vitals   09/03/23 1244  BP: (!) 147/78  Pulse: 62  Weight: 247 lb (112 kg)  Height: 6' 1 (1.854 m)   Body mass index is 32.59 kg/m.   Orthostatic VS for the past 24 hrs:  BP- Lying Pulse- Lying BP- Sitting Pulse- Sitting BP- Standing at 0 minutes Pulse- Standing at 0 minutes  09/03/23 1255 142/84 70 138/80 74 100/64 70   General: Obese elderly African-American male, seated, in no evident distress Head: head normocephalic and atraumatic.   Neck: supple with no carotid or supraclavicular bruits Cardiovascular: regular rate and rhythm, no murmurs  Neurologic Exam Mental Status: Awake and fully alert. Oriented to place and time. Recent and remote memory intact. Attention span, concentration and fund of knowledge appropriate. Mood and affect appropriate.  Cranial Nerves: Pupils equal, briskly reactive to light. Extraocular movements full without nystagmus. Visual fields full to confrontation. Hearing severely diminished bilaterally.  Facial sensation intact. Face, tongue, palate moves normally and symmetrically.  Motor: Normal bulk and tone. Normal strength in all tested extremity muscles. Sensory.: intact to touch , pinprick , but impaired position and vibratory sensation from knee down\ Coordination: Rapid alternating movements normal in all extremities. Finger-to-nose and heel-to-shin performed accurately bilaterally. Gait and Station: Arises from chair without difficulty. Stance is slightly stooped.  Gait is  broad-based using a cane with mild imbalance. Reflexes: 1+ and symmetric. Toes downgoing.       ASSESSMENT: 89 year old African-American male with subacute dizziness and gait and balance difficulties likely multifactorial due to combination of orthostatic hypotension, diabetic polyneuropathy, polypharmacy, and age-related changes of chronic small vessel disease. Evidence of orthostatic hypotension with approx 40 pt difference between laying and standing.      PLAN:  -Discussed orthostatic hypotension and conservative measures at home such as changing positions slowly, use of compression stockings, ensuring adequate hydration, etc -f/u with cardiology this afternoon for ongoing monitoring and medication management -Continue to follow with endocrinology for diabetic management -will f/u regarding results of CTA head/neck     No further recommendations from neurological standpoint and can follow-up on an as-needed basis.  He was advised to call with any questions or concerns in the future.    I personally spent a total of 30 minutes in the care of the patient today including preparing to see the patient, performing a medically appropriate exam/evaluation, counseling and educating, and documenting clinical information in the EHR.  Harlene Bogaert, AGNP-BC  [facility] [address] [place], [place] [address]  Phone [phone] Fax [phone] Note: This document was prepared with digital dictation and possible smart phrase technology. Any transcriptional errors that result from this process are unintentional.

## 2023-09-03 NOTE — Addendum Note (Signed)
 Addended by: Caleb Prigmore L on: 09/03/2023 02:55 PM   Modules accepted: Orders

## 2023-09-03 NOTE — Progress Notes (Addendum)
 Date:  09/03/2023   ID:  [name], [name] Aug 10, 1931, MRN [id number] The patient was identified using 2 identifiers.  PCP:  Maree Leni Edyth DELENA, MD  Cardiologist:  Aleene Passe, MD (Inactive)    Referring MD: Maree Leni Edyth DELENA, MD   Chief Complaint  Patient presents with   Coronary Artery Disease   Hypertension   Hyperlipidemia   Congestive Heart Failure    History of Present Illness:    [name]  [name] is a 89 y.o. male with a hx of CAD s/p CABG 01/2013, type II diabetes mellitus, BPH, chronic systolic heart failure, hypertension, PAD, and hyperlipidemia presents today for follow-up appointment.   He was seen 03/22/2021 and was having some mid  sternal chest pain. It was occurring after he ate food. It would resolve in about 30 minutes. He did not need a nitro. He took some Prilosec and it eventually resolved. He is chronically SOB, he had some indigestion like pains at times but not exertional.   He was seen by Dr. Passe on 09/23/2021 and denied any chest pains or shortness of breath.   He was still having some claudication with ambulation and was set up to see Dr. Court.    Seen again in August 2024 due to left-sided chest pain.  This occurred while he was lying in bed writing.  He does have a history of chronic left upper shoulder pain.  He was also having some shoulder pain.   Felt like he was having some acid reflux maybe a pulled muscle as well.  The upper left pain finally went away.  When he moved his arm a certain way the pain to go away was felt that it was likely musculoskeletal.Unfortunately, he presented to the ER 10/14/2022 with weakness and altered mental status.  Found to have a UTI.  He was treated with antibiotics.   Last seen by Orren Fabry, PA on 12/09/2018 dizziness and confusion. He was treated in the ER with antibiotics for a urinary tract infection, which has since resolved.  At the last office visit his Lasix  was changed to as needed if he had shortness of  breath or swelling in his legs or weight gain.  His losartan  was decreased from 50 to 25 mg daily to help with his dizziness.  He is now back today for follow-up.  He has chronic CP that seems fairly well controlled on medication.  He says that last week he had a vague discomfort in his chest but it was not a pain.  He denies any SOB, DOE, PND, orthopnea,  syncope or palpitations. He has chronic LE edema which is controlled. He was seen by Neuro today and was continuing to complain of dizziness that he says occurs when he gets up too fast he feels like he is unstable with his balance.  He says that he does not feel like he is going to pass out.     Past Medical History:  Diagnosis Date   Benign prostatic hyperplasia (BPH) with urinary urge incontinence 11/13/2017   Chronic left shoulder pain 11/13/2017   Chronic non-seasonal allergic rhinitis 02/17/2006   Chronic systolic heart failure (HCC) 10/31/2016   Echo (05/03/2015): LVEF 45-50%, grade 2 diastolic dysfunction   Coronary artery disease involving native coronary artery of native heart without angina pectoris 09/30/2007   s/p CABG on 01/05/2013: left internal mammary artery to left anterior descending, saphenous vein graft to obtuse marginal 1, sequential saphenous vein graft to acute marginal and posterior descending  Essential hypertension 11/04/2005   Gastroesophageal reflux disease with hiatal hernia 11/04/2005   History of prostate cancer 02/17/2006   Diagnosed 1995, s/p seed implantation in the late 1990's, PSA undetectable 04/10/2016   Hyperlipidemia 11/04/2005   Obesity (BMI 30.0-34.9) 11/13/2017   Peripheral arterial disease (HCC) 09/30/2007   with claudication, failed attempt at LLE PTCA   Type 2 diabetes mellitus with microalbuminuria, with long-term current use of insulin  (HCC) 11/13/2017   Type 2 diabetes mellitus with mild 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, bilateral (HCC) 03/24/2018   Type 2 diabetes mellitus  with peripheral neuropathy (HCC) 11/04/2005   Urethral stricture in male 11/13/2017   Noted on cystoscopy 12/18/2014.  Reportedly asked to do chronic intermittent self catheterizations, but has not.    Past Surgical History:  Procedure Laterality Date   BREAST SURGERY     CARDIAC CATHETERIZATION  10/05/2008   REVEALS HYPOKINESIS OF THE LATERAL WALL AND EF 50-55%   CORONARY ANGIOPLASTY WITH STENT PLACEMENT     CORONARY ARTERY BYPASS GRAFT N/A 01/05/2013   Procedure: CORONARY ARTERY BYPASS GRAFTING (CABG);  Surgeon: Elspeth JAYSON Millers, MD;  Location: [facility] OR;  Service: Open Heart Surgery;  Laterality: N/A;   INTRAOPERATIVE TRANSESOPHAGEAL ECHOCARDIOGRAM N/A 01/05/2013   Procedure: INTRAOPERATIVE TRANSESOPHAGEAL ECHOCARDIOGRAM;  Surgeon: Elspeth JAYSON Millers, MD;  Location: [facility] OR;  Service: Open Heart Surgery;  Laterality: N/A;   LEFT HEART CATHETERIZATION WITH CORONARY ANGIOGRAM N/A 01/03/2013   Procedure: LEFT HEART CATHETERIZATION WITH CORONARY ANGIOGRAM;  Surgeon: Dorn JINNY Lesches, MD;  Location: [facility] CATH LAB;  Service: Cardiovascular;  Laterality: N/A;   LOWER EXTREMITY ANGIOGRAM Right 12/07/2012   unsuccessful attempt at percutaneous revascularization of a calcified long segment chronic total occlusion mid left SFA /notes 12/07/2012   LOWER EXTREMITY ANGIOGRAM N/A 12/07/2012   Procedure: LOWER EXTREMITY ANGIOGRAM;  Surgeon: Dorn JINNY Lesches, MD;  Location[facility] C[facility] LAB;  Service: Cardiovascular;  Laterality: N/A;   PROSTATE SURGERY  10/04/1988    Current Medications: Current Meds  Medication Sig   allopurinol  (ZYLOPRIM ) 100 MG tablet Take 2 tablets by mouth once daily   amLODipine  (NORVASC ) 2.5 MG tablet Take 1 tablet (2.5 mg total) by mouth daily.   atorvastatin  (LIPITOR) 40 MG tablet Take 1 tablet (40 mg total) by mouth daily.   blood glucose meter kit and supplies KIT Dispense based on patient and insurance preference. Use up to four times daily as directed.   cetirizine  (ZYRTEC   ALLERGY) 10 MG tablet Take 0.5 tablets (5 mg total) by mouth daily. (Patient taking differently: Take 5 mg by mouth as needed for allergies.)   colchicine  0.6 MG tablet Take 1 tablet (0.6 mg total) by mouth 2 (two) times daily as needed (gout flare).   gabapentin  (NEURONTIN ) 100 MG capsule Take 100 mg by mouth 2 (two) times daily. Pt takes 2 tablet 200 mg bid   insulin  glargine (LANTUS ) 100 UNIT/ML injection Inject 0.12 mLs (12 Units total) into the skin daily.   insulin  lispro (HUMALOG ) 100 UNIT/ML injection INJECT 5 UNITS INTO THE SKIN 3 TIMES PER DAY BEFORE EACH MEAL   losartan  (COZAAR ) 25 MG tablet Take 1 tablet (25 mg total) by mouth daily.   metFORMIN  (GLUCOPHAGE ) 500 MG tablet Take 2 tablets in the morning and 1 tablet in the evening.   metoprolol  succinate (TOPROL  XL) 50 MG 24 hr tablet Take 1 tablet (50 mg total) by mouth daily. Take with or immediately following a meal.   metoprolol  tartrate (LOPRESSOR ) 25 MG tablet Take  2 tablets (50 mg total) by mouth 2 (two) times daily.   nitroGLYCERIN  (NITROSTAT ) 0.4 MG SL tablet Place 1 tablet (0.4 mg total) under the tongue every 5 (five) minutes as needed for chest pain.   pantoprazole  (PROTONIX ) 20 MG tablet Take 20 mg by mouth as needed for heartburn.   Push Button Safety Lancets 28G MISC 1 Lancet by Does not apply route 3 (three) times daily.   tamsulosin  (FLOMAX ) 0.4 MG CAPS capsule Take 1 capsule (0.4 mg total) by mouth daily.   trospium (SANCTURA) 20 MG tablet Take 20 mg by mouth as needed.   Current Facility-Administered Medications for the 09/03/23 encounter (Office Visit) with Shlomo Wilbert SAUNDERS, MD  Medication   Insulin  Pen Needle (NOVOFINE) 100 each     Allergies:   Patient has no known allergies.   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  Tobacco Use   Smoking status: Former    Current packs/day: 0.00    Average packs/day: 0.8 packs/day for 20.0 years (15.0 ttl pk-yrs)    Types: Cigarettes    Start date: 07/08/1960    Quit date: 07/08/1980    Years since quitting: 43.1   Smokeless tobacco: Never  Vaping Use   Vaping status: Never Used  Substance and Sexual Activity   Alcohol use: No    Alcohol/week: 0.0 standard drinks of alcohol    Comment: 12/07/2012 quit drinking alcohol > 25 yr ago   Drug use: No   Sexual activity: Not Currently  Other Topics Concern   Not on file  Social History Narrative   Occupation: Optician, dispensing   Married   Social Drivers of Corporate investment banker Strain: Low Risk  (10/21/2022)   Received from [facility]   Overall Financial Resource Strain (CARDIA)    Difficulty of Paying Living Expenses: Not very hard  Food Insecurity: No Food Insecurity (10/21/2022)   [facility]   Hunger Vital Sign    Within the past 12 months, you worried that your food would run out before you got the money to buy more.: Never true    Within the past 12 months, the food you bought just didn't last and you didn't have money to get more.: Never true  Transportation Needs: No Transportation Needs (10/21/2022)   Received from [facility] - Transportation    Lack of Transportation (Medical): No    Lack of Transportation (Non-Medical): No  Physical Activity: Inactive (10/09/2021)   Exercise Vital Sign    Days of Exercise per Week: 0 days    Minutes of Exercise per Session: 0 min  Stress: No Stress Concern Present (10/09/2021)   [employer] of Occupational Health - Occupational Stress Questionnaire    Feeling of Stress : Not at all  Social Connections: Socially Integrated (10/09/2021)   Social Connection and Isolation Panel    Frequency of Communication with Friends and Family: Twice a week    Frequency of Social Gatherings with Friends and  Family: Twice a week    Attends Religious Services: More than 4 times per year    Active Member of [employer] or Organizations: Yes    Attends Banker Meetings: More than  4 times per year    Marital Status: Married     Family History: The patient's family history includes Healthy in his daughter, daughter, daughter, and son; Hypertension in his father; Kidney failure in his mother; Other in his brother, brother, brother, brother, brother, brother, brother, brother, and sister; Stroke in his father.  ROS:   Please see the history of present illness.    ROS  All other systems reviewed and negative.   EKGs/Labs/Other Studies Reviewed:    The following studies were reviewed today:  Physical Exam:    VS:  BP (!) 148/76   Pulse 73   Ht 6' 1 (1.854 m)   Wt 246 lb (111.6 kg)   SpO2 97%   BMI 32.46 kg/m     Wt Readings from Last 3 Encounters:  09/03/23 246 lb (111.6 kg)  09/03/23 247 lb (112 kg)  06/16/23 248 lb 3.2 oz (112.6 kg)      ASSESSMENT:    1. Hyperlipidemia LDL goal <70   2. Chronic combined systolic (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)   3. Dizziness   4. Essential hypertension   5. Coronary artery disease involving native coronary artery of native heart without angina pectoris    PLAN:    In order of problems listed above:  HLD -LDL 99 which is above goal -Goal LDL less than 70 due to CAD -I have personally reviewed and interpreted outside labs performed by patient's PCP which showed LDL 41, HDL 39 on 12/09/2022 -Continue atorvastatin  40 mg daily with as needed refills   Chronic combined systolic/diastolic CHF CHF - 2D echo 2017 showed EF 45 to 50% with inferior lateral and inferior hypokinesis, G2 DD and mild MR  - he appears euvolemic on exam today with only trivial LE edema - Continue losartan  25 mg daily Toprol -XL 50 mg daily with as needed refills - No SGLT2i due to frequent UTIs - Repeat 2D echo since it has been  almost 10 years   Dizziness - it was felt to Possibly be medication-related dizziness. Patient reports taking multiple medications at once. - Advise taking medications with food to prevent dizziness. - he tells me that the dizziness is not like he is going to pass out>>he feels off balance with the room and unsteady on his feet when he stand up - At last office visit his Lasix  was changed to as needed and losartan  was decreased to 25 mg daily  - BP is mildly elevated today but will not make any medication changes as he tends to have issues with dizziness on higher doses of blood pressure medicines and advanced - Encouraged him to stay hydrated as well as make slow changes when going from sitting to standing and avoid standing for long periods of time.   Hypertension - BP borderline controlled but given his problems with dizziness on higher doses will not make any changes today - Continue amlodipine  2.5 mg daily, losartan  25 mg daily, Toprol -XL 50 mg daily with as needed refills  ASCAD - Status post remote CABG in 2014  - he denies any anginal symptoms - Continue atorvastatin  40 mg daily, Toprol -XL 50 mg daily   Followup in 6 months with PA and with me in 1 year  Time Spent: 20 minutes total time of encounter, including 15 minutes spent in face-to-face patient care on the date of this encounter. This time includes coordination of care and counseling regarding above mentioned problem list. Remainder of non-face-to-face time involved reviewing  chart documents/testing relevant to the patient encounter and documentation in the medical record. I have independently reviewed documentation from referring provider  Medication Adjustments/Labs and Tests Ordered: Current medicines are reviewed at length with the patient today.  Concerns regarding medicines are outlined above.  No orders of the defined types were placed in this encounter.  No orders of the defined types were placed in this  encounter.   Signed, Wilbert Bihari, MD  09/03/2023 2:49 PM    [facility]

## 2023-09-03 NOTE — Patient Instructions (Signed)
 Medication Instructions:  Your physician recommends that you continue on your current medications as directed. Please refer to the Current Medication list given to you today.  *If you need a refill on your cardiac medications before your next appointment, please call your pharmacy*  Lab Work: None.   If you have labs (blood work) drawn today and your tests are completely normal, you will receive your results only by: MyChart Message (if you have MyChart) OR A paper copy in the mail If you have any lab test that is abnormal or we need to change your treatment, we will call you to review the results.  Testing/Procedures: Your physician has requested that you have an echocardiogram. Echocardiography is a painless test that uses sound waves to create images of your heart. It provides your doctor with information about the size and shape of your heart and how well your heart's chambers and valves are working. This procedure takes approximately one hour. There are no restrictions for this procedure. Please do NOT wear cologne, perfume, aftershave, or lotions (deodorant is allowed). Please arrive 15 minutes prior to your appointment time.  Please note: We ask at that you not bring children with you during ultrasound (echo/ vascular) testing. Due to room size and safety concerns, children are not allowed in the ultrasound rooms during exams. Our front office staff cannot provide observation of children in our lobby area while testing is being conducted. An adult accompanying a patient to their appointment will only be allowed in the ultrasound room at the discretion of the ultrasound technician under special circumstances. We apologize for any inconvenience.   Follow-Up: At [facility], you and your health needs are our priority.  As part of our continuing mission to provide you with exceptional heart care, our providers are all part of one team.  This team includes your primary Cardiologist  (physician) and Advanced Practice Providers or APPs (Physician Assistants and Nurse Practitioners) who all work together to provide you with the care you need, when you need it.  Your next appointment:   6 month(s)  Provider:   One of our Advanced Practice Providers (APPs): Morse Clause, PA-C  Lamarr Satterfield, NP Miriam Shams, NP  Olivia Pavy, PA-C Josefa Beauvais, NP  Leontine Salen, PA-C Orren Fabry, PA-C  Frannie, PA-C Ernest Dick, NP  Damien Braver, NP Jon Hails, PA-C  Waddell Donath, PA-C    Dayna Dunn, PA-C  Scott Weaver, PA-C Lum Louis, NP Katlyn West, NP Callie Goodrich, PA-C  Evan Williams, PA-C Sheng Haley, PA-C  Xika Zhao, NP Kathleen Johnson, PA-C   Then, Dr. Wilbert Bihari, MDwill plan to see you again in 1 year(s).    We recommend signing up for the patient portal called MyChart.  Sign up information is provided on this After Visit Summary.  MyChart is used to connect with patients for Virtual Visits (Telemedicine).  Patients are able to view lab/test results, encounter notes, upcoming appointments, etc.  Non-urgent messages can be sent to your provider as well.   To learn more about what you can do with MyChart, go to [email or web address].

## 2023-09-10 ENCOUNTER — Ambulatory Visit: Payer: Self-pay | Admitting: Neurology

## 2023-09-10 NOTE — Telephone Encounter (Signed)
 Attempted to contact pt, LVM rq call back.

## 2023-09-10 NOTE — Progress Notes (Signed)
 Kindly inform the patient that CT angiogram study of the blood vessels of the head and the neck showed 2 areas of blockages of blood vessel towards the back on the top on the right side as well as to the left towards the bottom but this can be managed with medical treatment at this time.  Continue ongoing medical management.

## 2023-10-13 ENCOUNTER — Ambulatory Visit (HOSPITAL_COMMUNITY)
Admission: RE | Admit: 2023-10-13 | Discharge: 2023-10-13 | Disposition: A | Discharge status: Home / Self Care | Source: Ambulatory Visit | Attending: Cardiology | Admitting: Cardiology

## 2023-10-13 DIAGNOSIS — I5042 Chronic combined systolic (congestive) and diastolic (congestive) heart failure: Secondary | ICD-10-CM | POA: Insufficient documentation

## 2023-10-13 DIAGNOSIS — I420 Dilated cardiomyopathy: Secondary | ICD-10-CM | POA: Insufficient documentation

## 2023-10-13 MED ORDER — PERFLUTREN LIPID MICROSPHERE
1.0000 mL | INTRAVENOUS | Status: AC | PRN
  Administered 2023-10-13: 2 mL via INTRAVENOUS

## 2023-10-14 ENCOUNTER — Encounter: Payer: Self-pay | Admitting: Cardiology

## 2023-10-14 ENCOUNTER — Ambulatory Visit: Payer: Self-pay | Admitting: Cardiology

## 2023-10-14 DIAGNOSIS — I340 Nonrheumatic mitral (valve) insufficiency: Secondary | ICD-10-CM | POA: Insufficient documentation

## 2023-10-14 DIAGNOSIS — I351 Nonrheumatic aortic (valve) insufficiency: Secondary | ICD-10-CM | POA: Insufficient documentation

## 2023-10-14 LAB — ECHOCARDIOGRAM COMPLETE
Area-P 1/2: 4.1 cm2
S' Lateral: 4.8 cm

## 2023-10-14 NOTE — Telephone Encounter (Signed)
 Call to patient to call echo results. Spoke briefly with unidentified family member, left message to have patient call [place] at our office #.

## 2023-10-14 NOTE — Telephone Encounter (Signed)
-----   Message from Wilbert Bihari sent at 10/14/2023  4:09 PM EDT ----- Compared to prior echo from 2017, EF reported as 35-40% with focal wall motion abnormalities but personally reviewed and feel EF 40-45%.  Please find out if he has had any CP or SOB ----- Message ----- From: Interface, [employer] Sent: 10/14/2023   9:00 AM EDT To: Wilbert JONELLE Bihari, MD

## 2023-10-20 ENCOUNTER — Ambulatory Visit: Admitting: Endocrinology

## 2023-10-20 ENCOUNTER — Ambulatory Visit: Payer: Self-pay | Admitting: Endocrinology

## 2023-10-20 ENCOUNTER — Encounter: Payer: Self-pay | Admitting: Endocrinology

## 2023-10-20 VITALS — BP 118/60 | HR 74 | Resp 20 | Ht 73.0 in | Wt 247.4 lb

## 2023-10-20 DIAGNOSIS — E1142 Type 2 diabetes mellitus with diabetic polyneuropathy: Secondary | ICD-10-CM

## 2023-10-20 DIAGNOSIS — Z794 Long term (current) use of insulin: Secondary | ICD-10-CM | POA: Diagnosis not present

## 2023-10-20 LAB — POCT GLYCOSYLATED HEMOGLOBIN (HGB A1C): Hemoglobin A1C: 8.1 % — AB (ref 4.0–5.6)

## 2023-10-20 NOTE — Progress Notes (Signed)
 Outpatient Endocrinology Note [place] Tawona Filsinger, MD  10/20/23  Patient's Name: [name] [name]    DOB: [birth year]    MRN: [id number]                                                    REASON OF VISIT: Follow up for type 2 diabetes mellitus  PCP: Maree Leni Edyth DELENA, MD  HISTORY OF PRESENT ILLNESS:   [name]  [name] is a 89 y.o. old male with past medical history listed below, is here for follow up of type 2 diabetes mellitus.   Pertinent Diabetes History: Patient was diagnosed with type 2 diabetes mellitus in 1990.  He has been on insulin  therapy since 2005.  He was on NPH and regular insulin  in the past and also basal bolus regimen.  He had usually controlled diabetes mellitus with hemoglobin A1c near 7% in the past.  Chronic Diabetes Complications : Retinopathy: yes. Following with ophthalmology. Nephropathy: CKD, on losartan  Peripheral neuropathy: yes, on gabapentin  Coronary artery disease: yes, s/p CABG Stroke: no  Relevant comorbidities and cardiovascular risk factors: Obesity: no Body mass index is 32.64 kg/m.  Hypertension: yes Hyperlipidemia. Yes, on statin.   Current / Home Diabetic regimen includes: Metformin  ER 1000 mg in the morning and 500 mg in the evening. Lantus  12 units at bedtime. Humalog  5 units with meals 3 times a day / morning and in the afternoon.   Prior diabetic medications: NPH and regular insulin   Glycemic data:   Glucose data reviewed directly from the meter last few days 146, 112, 183, 262, 191, 177, 190, mostly blood sugar checking in the evening.  Hypoglycemia: Patient has no hypoglycemic episodes. Patient has hypoglycemia awareness.  Factors modifying glucose control: 1.  Diabetic diet assessment: 3 meals a day.  2.  Staying active or exercising: No formal exercise.  3.  Medication compliance: compliant most of the time.  Interval history  Hemoglobin A1c today 8.1%.  Diabetes regimen is reviewed and noted above.  Blood sugar data  from glucometer as reviewed above being checked in the evening.  Denies any hypoglycemic symptoms.  No other complaints today.  Patient is accompanied by daughter in the clinic today.  REVIEW OF SYSTEMS As per history of present illness.   PAST MEDICAL HISTORY: Past Medical History:  Diagnosis Date   Aortic regurgitation    Mild by echo 10/2023   Ascending aorta dilatation (HCC)    42 mm by 2D echo 10/2023   Benign prostatic hyperplasia (BPH) with urinary urge incontinence 11/13/2017   Chronic left shoulder pain 11/13/2017   Chronic non-seasonal allergic rhinitis 02/17/2006   Chronic systolic heart failure (HCC) 10/31/2016   Echo (05/03/2015): LVEF 45-50%, grade 2 diastolic dysfunction.  2D echo 10/2023 with EF 35 to 40% and antero-lateral wall, mid and distal inferior wall, posterior wall, basal anteroseptal segment, mid inferoseptal segment, and basal inferoseptal segment are hypokinetic.   Coronary artery disease involving native coronary artery of native heart without angina pectoris 09/30/2007   s/p CABG on 01/05/2013: left internal mammary artery to left anterior descending, saphenous vein graft to obtuse marginal 1, sequential saphenous vein graft to acute marginal and posterior descending   Essential hypertension 11/04/2005   Gastroesophageal reflux disease with hiatal hernia 11/04/2005   History of prostate cancer 02/17/2006   Diagnosed 1995, s/p seed implantation in the  late 1990's, PSA undetectable 04/10/2016   Hyperlipidemia 11/04/2005   Mitral regurgitation    Mild by echo 10/2023   Obesity (BMI 30.0-34.9) 11/13/2017   Peripheral arterial disease (HCC) 09/30/2007   with claudication, failed attempt at LLE PTCA   Type 2 diabetes mellitus with microalbuminuria, with long-term current use of insulin  (HCC) 11/13/2017   Type 2 diabetes mellitus with mild 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, bilateral (HCC) 03/24/2018   Type 2 diabetes mellitus with peripheral  neuropathy (HCC) 11/04/2005   Urethral stricture in male 11/13/2017   Noted on cystoscopy 12/18/2014.  Reportedly asked to do chronic intermittent self catheterizations, but has not.    PAST SURGICAL HISTORY: Past Surgical History:  Procedure Laterality Date   BREAST SURGERY     CARDIAC CATHETERIZATION  10/05/2008   REVEALS HYPOKINESIS OF THE LATERAL WALL AND EF 50-55%   CORONARY ANGIOPLASTY WITH STENT PLACEMENT     CORONARY ARTERY BYPASS GRAFT N/A 01/05/2013   Procedure: CORONARY ARTERY BYPASS GRAFTING (CABG);  Surgeon: Elspeth JAYSON Millers, MD;  [facility];  Service: Open Heart Surgery;  Laterality: N/A;   INTRAOPERATIVE TRANSESOPHAGEAL ECHOCARDIOGRAM N/A 01/05/2013   Procedure: INTRAOPERATIVE TRANSESOPHAGEAL ECHOCARDIOGRAM;  Surgeon: Elspeth JAYSON Millers, MD;  Location: [facility] OR;  Service: Open Heart Surgery;  Laterality: N/A;   LEFT HEART CATHETERIZATION WITH CORONARY ANGIOGRAM N/A 01/03/2013   Procedure: LEFT HEART CATHETERIZATION WITH CORONARY ANGIOGRAM;  Surgeon: Dorn JINNY Lesches, MD;  Location: [facility] CATH LAB;  Service: Cardiovascular;  Laterality: N/A;   LOWER EXTREMITY ANGIOGRAM Right 12/07/2012   unsuccessful attempt at percutaneous revascularization of a calcified long segment chronic total occlusion mid left SFA /notes 12/07/2012   LOWER EXTREMITY ANGIOGRAM N/A 12/07/2012   Procedure: LOWER EXTREMITY ANGIOGRAM;  Surgeon: Dorn JINNY Lesches, MD;  Location: [facility] CATH LAB;  Service: Cardiovascular;  Laterality: N/A;   PROSTATE SURGERY  10/04/1988    ALLERGIES: No Known Allergies  FAMILY HISTORY:  Family History  Problem Relation Age of Onset   Kidney failure Mother    Hypertension Father    Stroke Father    Other Sister    Other Brother    Other Brother    Other Brother    Other Brother    Other Brother    Other Brother    Other Brother    Other Brother    Healthy Daughter    Healthy Daughter    Healthy Daughter    Healthy Son     SOCIAL HISTORY: Social History    Socioeconomic History   Marital status: Married    Spouse name: Not on file   Number of children: 4   Years of education: 12   Highest education level: 12th grade  Occupational History   Occupation: [employer] doctor    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  Tobacco Use   Smoking status: Former    Current packs/day: 0.00    Average packs/day: 0.8 packs/day for 20.0 years (15.0 ttl pk-yrs)    Types: Cigarettes    Start date: 07/08/1960    Quit date: 07/08/1980    Years since quitting: 43.3   Smokeless tobacco: Never  Vaping Use   Vaping status: Never Used  Substance and Sexual Activity   Alcohol use: No    Alcohol/week: 0.0 standard drinks of alcohol    Comment: 12/07/2012 quit drinking alcohol > 25 yr ago   Drug use: No   Sexual activity: Not Currently  Other Topics Concern   Not on file  Social History Narrative   Occupation: Optician, dispensing   Married   Social Drivers of Corporate investment banker Strain: Low Risk  (10/21/2022)   Received from [facility]   Overall Financial Resource Strain (CARDIA)    Difficulty of Paying Living Expenses: Not very hard  Food Insecurity: No Food Insecurity (10/21/2022)   Received from [facility]   Hunger Vital Sign    Within the past 12 months, you worried that your food would run out before you got the money to buy more.: Never true    Within the past 12 months, the food you bought just didn't last and you didn't have money to get more.: Never true  Transportation Needs: No Transportation Needs (10/21/2022)   Received from [facility] - Transportation    Lack of Transportation (Medical): No    Lack of Transportation (Non-Medical): No  Physical Activity: Inactive (10/09/2021)   Exercise Vital Sign    Days of Exercise per Week: 0 days    Minutes of Exercise per Session: 0 min  Stress: No Stress Concern Present (10/09/2021)   [employer] of Occupational Health - Occupational Stress Questionnaire    Feeling of Stress : Not  at all  Social Connections: Socially Integrated (10/09/2021)   Social Connection and Isolation Panel    Frequency of Communication with Friends and Family: Twice a week    Frequency of Social Gatherings with Friends and Family: Twice a week    Attends Religious Services: More than 4 times per year    Active Member of [employer] or Organizations: Yes    Attends Engineer, structural: More than 4 times per year    Marital Status: Married    MEDICATIONS:  Current Outpatient Medications  Medication Sig Dispense Refill   allopurinol  (ZYLOPRIM ) 100 MG tablet Take 2 tablets by mouth once daily 180 tablet 0   amLODipine  (NORVASC ) 2.5 MG tablet Take 1 tablet (2.5 mg total) by mouth daily. 90 tablet 3   blood glucose meter kit and supplies KIT Dispense based on patient and insurance preference. Use up to four times daily as directed. 1 each 0   cetirizine  (ZYRTEC  ALLERGY) 10 MG tablet Take 0.5 tablets (5 mg total) by mouth daily. (Patient taking differently: Take 5 mg by mouth as needed for allergies.) 15 tablet 0   colchicine  0.6 MG tablet Take 1 tablet (0.6 mg total) by mouth 2 (two) times daily as needed (gout flare). 60 tablet 1   gabapentin  (NEURONTIN ) 100 MG capsule Take 100 mg by mouth 2 (two) times daily. Pt takes 2 tablet 200 mg bid     insulin  glargine (LANTUS ) 100 UNIT/ML injection Inject 0.12 mLs (12 Units total) into the skin daily. 10 mL 11   insulin  lispro (HUMALOG ) 100 UNIT/ML injection INJECT 5 UNITS INTO THE SKIN 3 TIMES PER DAY BEFORE EACH MEAL 10 mL 11   losartan  (COZAAR ) 25 MG tablet Take 1 tablet (25 mg total) by mouth daily. 90 tablet 3   metFORMIN  (GLUCOPHAGE ) 500 MG tablet Take 2 tablets in the morning and 1 tablet in the evening. 270 tablet 3   metoprolol  succinate (TOPROL  XL) 50 MG 24 hr tablet Take 1 tablet (50 mg total) by mouth daily. Take with or immediately following a meal. 90 tablet 3   nitroGLYCERIN  (NITROSTAT ) 0.4 MG SL tablet Place 1 tablet (0.4 mg total) under  the tongue every 5 (five) minutes as needed for chest pain. 25 tablet 3   pantoprazole  (  PROTONIX ) 20 MG tablet Take 20 mg by mouth as needed for heartburn.     Push Button Safety Lancets 28G MISC 1 Lancet by Does not apply route 3 (three) times daily. 300 each 3   tamsulosin  (FLOMAX ) 0.4 MG CAPS capsule Take 1 capsule (0.4 mg total) by mouth daily. 90 capsule 3   trospium (SANCTURA) 20 MG tablet Take 20 mg by mouth as needed.     Vitamin D, Ergocalciferol, (DRISDOL) 1.25 MG (50000 UNIT) CAPS capsule Take 50,000 Units by mouth once a week.     atorvastatin  (LIPITOR) 40 MG tablet Take 1 tablet (40 mg total) by mouth daily. 90 tablet 3   Current Facility-Administered Medications  Medication Dose Route Frequency Provider Last Rate Last Admin   Insulin  Pen Needle (NOVOFINE) 100 each  10 packet Subcutaneous TID Von [place], MD        PHYSICAL EXAM: Vitals:   10/20/23 1407  BP: 118/60  Pulse: 74  Resp: 20  SpO2: 98%  Weight: 247 lb 6.4 oz (112.2 kg)  Height: 6' 1 (1.854 m)       Body mass index is 32.64 kg/m.  Wt Readings from Last 3 Encounters:  10/20/23 247 lb 6.4 oz (112.2 kg)  09/03/23 246 lb (111.6 kg)  09/03/23 247 lb (112 kg)    General: Well developed, well nourished male in no apparent distress.  HEENT: AT/[place], no external lesions.  Eyes: Conjunctiva clear and no icterus. Neck: Neck supple  Lungs: Respirations not labored Neurologic: Alert, oriented, normal speech Extremities / Skin: Dry.   Psychiatric: Does not appear depressed or anxious  Diabetic Foot Exam - Simple   No data filed      LABS Reviewed Lab Results  Component Value Date   HGBA1C 8.1 (A) 10/20/2023   HGBA1C 7.5 (A) 06/16/2023   HGBA1C 7.2 (A) 03/16/2023   Lab Results  Component Value Date   FRUCTOSAMINE 312 (H) 11/28/2022   FRUCTOSAMINE 320 (H) 09/01/2022   Lab Results  Component Value Date   CHOL 93 (L) 12/09/2022   HDL 39 (L) 12/09/2022   LDLCALC 41 12/09/2022   TRIG 55 12/09/2022    CHOLHDL 2.4 12/09/2022   Lab Results  Component Value Date   MICRALBCREAT 260 (H) 03/16/2023   MICRALBCREAT 124 (H) 09/14/2020   Lab Results  Component Value Date   CREATININE 1.22 04/29/2023   Lab Results  Component Value Date   GFR 60.32 11/28/2022    ASSESSMENT / PLAN  1. Type 2 diabetes mellitus with diabetic polyneuropathy, with long-term current use of insulin  (HCC)     Diabetes Mellitus type 2, complicated by diabetic neuropathy /retinopathy/CKD - Diabetic status / severity: fair controlled.   Lab Results  Component Value Date   HGBA1C 8.1 (A) 10/20/2023    - Hemoglobin A1c goal : <8%  Patient has fair control of diabetes mellitus.  He can have permissive hyperglycemia based on age and comorbidities.  Has been checking blood sugar in the evening daily only.  To avoid risks of hypoglycemia will not increase the dose of insulin  we will keep the current dose as follows.  - Medications: See below no change.  Continue Lantus  12 units daily.  Humalog  5 units with breakfast at 8 or 9 AM, 5 units with lunch at 1 or 2 PM and 5 units with supper at 5 or 6 PM with supper.   Metformin  1000 mg the morning and 500 mg in the evening.  - Home glucose testing: Before  meals and at bedtime.    - Discussed/ Gave Hypoglycemia treatment plan.  # Consult : not required at this time.   # Annual urine for microalbuminuria/ creatinine ratio, + microalbuminuria currently, continue ACE/ARB /losartan .  Last  Lab Results  Component Value Date   MICRALBCREAT 260 (H) 03/16/2023    # Foot check nightly / neuropathy, continue gabapentin , managed by primary care provider.  # Annual dilated diabetic eye exams.  Follow-up with ophthalmology.  2. Blood pressure  -  BP Readings from Last 1 Encounters:  10/20/23 118/60    - Control is in target.  - No change in current plans.  3. Lipid status / Hyperlipidemia - Last  Lab Results  Component Value Date   LDLCALC 41 12/09/2022    - Continue atorvastatin  40 mg daily.  Diagnoses and all orders for this visit:  Type 2 diabetes mellitus with diabetic polyneuropathy, with long-term current use of insulin  (HCC) -     POCT glycosylated hemoglobin (Hb A1C)    DISPOSITION Follow up in clinic in 4 month suggested.   All questions answered and patient verbalized understanding of the plan.  [place] Sanjna Haskew, MD [facility] Endocrinology [facility] [address] [place], [address] [place], [place] [address] Phone # [phone]  At least part of this note was generated using voice recognition software. Inadvertent word errors may have occurred, which were not recognized during the proofreading process.

## 2023-10-22 NOTE — Telephone Encounter (Signed)
 Called to go over echo results. No answer. LVM per DPR.

## 2023-10-22 NOTE — Telephone Encounter (Signed)
-----   Message from [name] [name] sent at 10/14/2023  4:09 PM EDT ----- Compared to prior echo from 2017, EF reported as 35-40% with focal wall motion abnormalities but personally reviewed and feel EF 40-45%.  Please find out if he has had any CP or SOB ----- Message ----- From: Interface, [employer] Sent: 10/14/2023   9:00 AM EDT To: [name] JONELLE Bihari, MD

## 2023-10-26 ENCOUNTER — Telehealth: Payer: Self-pay | Admitting: Cardiology

## 2023-10-26 NOTE — Telephone Encounter (Signed)
 Was calling back for results. Please advise

## 2023-10-26 NOTE — Telephone Encounter (Signed)
 Spoke with patient's daughter. Scheduled  for 10/30/23 with [place] NP

## 2023-10-26 NOTE — Telephone Encounter (Signed)
 [name] JONELLE Bihari, MD 10/14/2023  4:09 PM EDT     Compared to prior echo from 2017, EF reported as 35-40% with focal wall motion abnormalities but personally reviewed and feel EF 40-45%.  Please find out if he has had any CP or SOB   [name] JONELLE Bihari, MD 10/14/2023  2:03 PM EDT     2D echo showed moderately reduced LV function EF 35 to 40% with mildly thickened heart muscle and increase stiffness of the heart muscle called diastolic dysfunction.  There is mild leakiness of the mitral valve and some mild calcification of the aortic valve with mild leakiness of the aortic valve.  The ascending aorta is enlarged at 42 mm.   Called daughter with results. She reports patient is having a little chest pain for at least 1 month. Daughter lives in [name] and the last time she was here with him was around Labor Day. Patient's spouse also recently passed away over the weekend. Daughter will be in town again with patient on Wednesday.

## 2023-10-29 NOTE — Progress Notes (Unsigned)
 Cardiology Office Note    Patient Name: [name] [name]  [name] [name] [name] Date of Encounter: 10/29/2023  Primary Care Provider:  Maree Leni Edyth DELENA, MD Primary Cardiologist:  [name] [name] Passe, MD (Inactive) Primary Electrophysiologist: None   Past Medical History    Past Medical History:  Diagnosis Date   Aortic regurgitation    Mild by echo 10/2023   Ascending aorta dilatation    42 mm by 2D echo 10/2023   Benign prostatic hyperplasia (BPH) with urinary urge incontinence 11/13/2017   Chronic left shoulder pain 11/13/2017   Chronic non-seasonal allergic rhinitis 02/17/2006   Chronic systolic heart failure (HCC) 10/31/2016   Echo (05/03/2015): LVEF 45-50%, grade 2 diastolic dysfunction.  2D echo 10/2023 with EF 35 to 40% and antero-lateral wall, mid and distal inferior wall, posterior wall, basal anteroseptal segment, mid inferoseptal segment, and basal inferoseptal segment are hypokinetic.   Coronary artery disease involving native coronary artery of native heart without angina pectoris 09/30/2007   s/p CABG on 01/05/2013: left internal mammary artery to left anterior descending, saphenous vein graft to obtuse marginal 1, sequential saphenous vein graft to acute marginal and posterior descending   Essential hypertension 11/04/2005   Gastroesophageal reflux disease with hiatal hernia 11/04/2005   History of prostate cancer 02/17/2006   Diagnosed 1995, s/p seed implantation in the late 1990's, PSA undetectable 04/10/2016   Hyperlipidemia 11/04/2005   Mitral regurgitation    Mild by echo 10/2023   Obesity (BMI 30.0-34.9) 11/13/2017   Peripheral arterial disease 09/30/2007   with claudication, failed attempt at LLE PTCA   Type 2 diabetes mellitus with microalbuminuria, with long-term current use of insulin  (HCC) 11/13/2017   Type 2 diabetes mellitus with mild 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, bilateral (HCC) 03/24/2018   Type 2 diabetes mellitus with peripheral neuropathy (HCC)  11/04/2005   Urethral stricture in male 11/13/2017   Noted on cystoscopy 12/18/2014.  Reportedly asked to do chronic intermittent self catheterizations, but has not.    History of Present Illness  [name] [name]  [name] [name] [name] is a 89 y.o. male with a PMH of CAD s/p CABG x 4 in 2014, PVD (unsuccessful left PTCA), ascending aortic dilation, DM, HTN, chronic systolic CHF, HLD, prostate CA who presents today for 67-month follow-up.  [name] [name] [name] [name] [name] was previously followed by [name] [name] [name] [name] [name] and [name] [name] [name] [name] [name] for management of PAD and CAD.  [name] completed a stress test in 2014 that showed a large fixed defect with EF of 40%.  [name] underwent LHC showing multivessel disease and CABG x 4 by [name] [name] [name] [name] [name] on 01/2013.  [name] [name] [name] [name] [name] high-grade segmental right and total left SFA stenosis with single-vessel runoff below the knees bilaterally.  [name] [name] [name] [name] [name] attempted unsuccessfully to percutaneously revascularize his left SFA. In 2017 due to complaint of shortness of breath and completed a Lexiscan  Myoview  that demonstrated a scar with mild peri-infarct ischemia.  Seen in follow-up on 05/2018 with complaint of worsening bilateral lower extremity edema x 10 days.  [name] had HCTZ discontinued and was started on low-dose Lasix . [name] presented to the ER 10/14/2022 with weakness and altered mental status. Found to have a UTI. [name] was treated with antibiotics.  [name] was seen in follow-up on 12/09/2022 with dizziness and confusion.  [name] had losartan  decreased from 50 to 25 mg dizziness and Lasix  was changed to as needed. [name] was last seen by [name] [name] [name] [name] [name] on 09/03/2023 follow-up.  During his visit [name] reported discomfort of his chest to have chronic lower extremity that was controlled.  [name] was recently evaluated by neuro  due to complaint of dizziness.  [name] underwent a repeat 2D echo that showed 35-40% mild concentric LVH and grade 1 DD and mild MR mild ascending aortic dilation.  [name] was noted to have elevated BP and no further medication changes were made at that time due to  associated dizziness with higher doses.  [name] [name] [name] [name] [name] presents today for 74-month follow-up with his family. [name] presents today for follow-up of dizziness and chest pain.  [name] experiences intermittent dizziness, particularly when transitioning from sitting to standing, and occasionally feels like [name] might pass out. His dizziness persists despite previous medication adjustments. Concerns exist about his food intake when alone, as [name] needs to take his medication with food. [name] consumes approximately 48 ounces of water daily, which may be insufficient. [name] has a history of chest pain, which has improved since starting a new medication regimen. [name] takes a total of eight to ten pills daily, including a specific medication for chest pain, which [name] takes three times a day. The chest pain has significantly decreased, although [name] occasionally experiences mild chest discomfort. An echocardiogram showed calcium  buildup on the aortic valve and some leaking (regurgitation), as was communicated to the patient and family. [name] has a history of swelling in his ankles, which has improved significantly with medication adjustments. His daughter notes a marked improvement in swelling since the last visit. [name] mentions increased stress due to the recent passing of his mother, which has affected the family. [name] reports frequent urination and will see a different doctor for this issue next month. Patient denies chest pain, palpitations, dyspnea, PND, orthopnea, nausea, vomiting, dizziness, syncope, edema, weight gain, or early satiety.  Discussed the use of AI scribe software for clinical note transcription with the patient, who gave verbal consent to proceed.  History of Present Illness   Review of Systems  Please see the history of present illness.    All other systems reviewed and are otherwise negative except as noted above.  Physical Exam    Wt Readings from Last 3 Encounters:  10/20/23 247 lb 6.4 oz (112.2 kg)  09/03/23 246  lb (111.6 kg)  09/03/23 247 lb (112 kg)   CD:Uyzmz were no vitals filed for this visit.,There is no height or weight on file to calculate BMI. GEN: Well nourished, well developed in no acute distress Neck: No JVD; No carotid bruits Pulmonary: Clear to auscultation without rales, wheezing or rhonchi  Cardiovascular: Normal rate. Regular rhythm. Normal S1. Normal S2.   Murmurs: There is no murmur.  ABDOMEN: Soft, non-tender, non-distended EXTREMITIES:  No edema; No deformity   EKG/LABS/ Recent Cardiac Studies   ECG personally reviewed by me today -none completed today  Risk Assessment/Calculations:          Lab Results  Component Value Date   WBC 9.2 04/29/2023   HGB 12.4 (L) 04/29/2023   HCT 38.0 (L) 04/29/2023   MCV 96.4 04/29/2023   PLT 164 04/29/2023   Lab Results  Component Value Date   CREATININE 1.22 04/29/2023   BUN 23 04/29/2023   NA 139 04/29/2023   K 4.5 04/29/2023   CL 107 04/29/2023   CO2 23 04/29/2023   Lab Results  Component Value Date   CHOL 93 (L) 12/09/2022   HDL 39 (L) 12/09/2022   LDLCALC 41 12/09/2022   TRIG 55 12/09/2022   CHOLHDL 2.4 12/09/2022    Lab Results  Component Value Date   HGBA1C 8.1 (A) 10/20/2023   Assessment & Plan  Assessment & Plan  1.  History of CAD: - s/p CABG x 4 on 01/2013 with last ischemic evaluation by Lexiscan  Myoview  in 2017 that was intermediate and low risk - Today patient reports improvement to previous chest pain and has resolved with medication. - Continue GDMT with Lipitor 40 mg, Toprol -XL 50 mg and.  Nitrostat  0.4 mg  2.  Chronic combined CHF: - 2D echo completed recently on 10/14/2023 with reduced LV function of 35-40% and mild LVH with grade 1 DD, mild MVR with mild calcification of AV and mild dilation of ascending aorta - [name] is euvolemic on examination with no complaints of shortness of breath with improvement to lower extremity swelling. - [name] reports improvement to dizziness with discontinuation of  Lasix . - Continue current GDMT with Toprol -XL 50 mg and losartan  25 mg -Low sodium diet, fluid restriction <2L, and daily weights encouraged. Educated to contact our office for weight gain of 2 lbs overnight or 5 lbs in one week.   3.  DM type II: - Patient's last hemoglobin A1c was 8.1 - Continue current treatment plan per PCP  4.  Essential hypertension: - Previous BP was elevated at follow-up and today is improved at 132/70 - Continue amlodipine  2.5 mg, losartan  25 mg and metoprolol  50 mg daily  5.  Dizziness: - Previously advised to take medication with food and today reports improvement since medications were adjusted. - [name] reports today no recent near-syncope reported after medication adjustment. - Encouraged increased water intake to at least four bottles daily. - Advised caution when transitioning from sitting to standing.   Disposition: Follow-up with [name] [name] [name] [name] [name] or APP in 6 months    Signed, Wyn Raddle, Jackee Shove, NP 10/29/2023, 6:24 PM [facility]

## 2023-10-30 ENCOUNTER — Encounter: Payer: Self-pay | Admitting: Nurse Practitioner

## 2023-10-30 ENCOUNTER — Ambulatory Visit: Attending: Nurse Practitioner | Admitting: Nurse Practitioner

## 2023-10-30 VITALS — BP 132/70 | HR 70 | Ht 73.0 in | Wt 245.6 lb

## 2023-10-30 DIAGNOSIS — I2510 Atherosclerotic heart disease of native coronary artery without angina pectoris: Secondary | ICD-10-CM

## 2023-10-30 DIAGNOSIS — I10 Essential (primary) hypertension: Secondary | ICD-10-CM

## 2023-10-30 DIAGNOSIS — I5042 Chronic combined systolic (congestive) and diastolic (congestive) heart failure: Secondary | ICD-10-CM | POA: Diagnosis not present

## 2023-10-30 DIAGNOSIS — I351 Nonrheumatic aortic (valve) insufficiency: Secondary | ICD-10-CM

## 2023-10-30 DIAGNOSIS — E1142 Type 2 diabetes mellitus with diabetic polyneuropathy: Secondary | ICD-10-CM | POA: Diagnosis not present

## 2023-10-30 DIAGNOSIS — I340 Nonrheumatic mitral (valve) insufficiency: Secondary | ICD-10-CM

## 2023-10-30 NOTE — Patient Instructions (Signed)
 Medication Instructions:  Your physician recommends that you continue on your current medications as directed. Please refer to the Current Medication list given to you today.  *If you need a refill on your cardiac medications before your next appointment, please call your pharmacy*  Lab Work: None Ordered If you have labs (blood work) drawn today and your tests are completely normal, you will receive your results only by: MyChart Message (if you have MyChart) OR A paper copy in the mail If you have any lab test that is abnormal or we need to change your treatment, we will call you to review the results.  Testing/Procedures: None ordered  Follow-Up: At [facility], you and your health needs are our priority.  As part of our continuing mission to provide you with exceptional heart care, our providers are all part of one team.  This team includes your primary Cardiologist (physician) and Advanced Practice Providers or APPs (Physician Assistants and Nurse Practitioners) who all work together to provide you with the care you need, when you need it.  Your next appointment:   6 month(s)  Provider:   Wilbert Bihari, MD  We recommend signing up for the patient portal called MyChart.  Sign up information is provided on this After Visit Summary.  MyChart is used to connect with patients for Virtual Visits (Telemedicine).  Patients are able to view lab/test results, encounter notes, upcoming appointments, etc.  Non-urgent messages can be sent to your provider as well.   To learn more about what you can do with MyChart, go to [email or web address].   Other Instructions

## 2023-12-05 ENCOUNTER — Other Ambulatory Visit: Payer: Self-pay | Admitting: Physician Assistant

## 2023-12-07 ENCOUNTER — Other Ambulatory Visit: Payer: Self-pay | Admitting: Physician Assistant

## 2023-12-09 MED ORDER — METOPROLOL SUCCINATE ER 50 MG PO TB24: 50.0000 mg | ORAL_TABLET | Freq: Every day | ORAL | 3 refills | Status: AC

## 2023-12-24 ENCOUNTER — Other Ambulatory Visit: Payer: Self-pay | Admitting: Endocrinology

## 2023-12-24 DIAGNOSIS — E1165 Type 2 diabetes mellitus with hyperglycemia: Secondary | ICD-10-CM

## 2024-02-10 ENCOUNTER — Other Ambulatory Visit: Payer: Self-pay | Admitting: Physician Assistant

## 2024-02-23 ENCOUNTER — Ambulatory Visit: Admitting: Endocrinology

## 2024-02-25 ENCOUNTER — Other Ambulatory Visit: Payer: Self-pay | Admitting: Ophthalmology

## 2024-02-25 DIAGNOSIS — H348192 Central retinal vein occlusion, unspecified eye, stable: Secondary | ICD-10-CM

## 2024-02-25 NOTE — Progress Notes (Shared)
 " [facility] - Clinic Note  03/07/2024   CHIEF COMPLAINT Patient presents for No chief complaint on file.  HISTORY OF PRESENT ILLNESS: [name]  [name] is a 89 y.o. male who presents to the clinic today for:   Referring physician: Maree Leni Edyth DELENA, MD [address] [place],  [place] [address]  HISTORICAL INFORMATION:  Selected notes from the MEDICAL RECORD NUMBER Referred by Dr. JAMA:  Ocular Hx- PMH-   CURRENT MEDICATIONS: No current outpatient medications on file. (Ophthalmic Drugs)   No current facility-administered medications for this visit. (Ophthalmic Drugs)   Current Outpatient Medications (Other)  Medication Sig   allopurinol  (ZYLOPRIM ) 100 MG tablet Take 2 tablets by mouth once daily   amLODipine  (NORVASC ) 2.5 MG tablet Take 1 tablet by mouth once daily   atorvastatin  (LIPITOR) 40 MG tablet Take 1 tablet (40 mg total) by mouth daily.   blood glucose meter kit and supplies KIT Dispense based on patient and insurance preference. Use up to four times daily as directed.   cetirizine  (ZYRTEC  ALLERGY) 10 MG tablet Take 0.5 tablets (5 mg total) by mouth daily. (Patient taking differently: Take 5 mg by mouth as needed for allergies.)   colchicine  0.6 MG tablet Take 1 tablet (0.6 mg total) by mouth 2 (two) times daily as needed (gout flare).   gabapentin  (NEURONTIN ) 100 MG capsule Take 100 mg by mouth 2 (two) times daily. Pt takes 2 tablet 200 mg bid   insulin  glargine (LANTUS ) 100 UNIT/ML injection INJECT 0.12 MLS (12 UNITS) INTO THE SKIN DAILY   insulin  lispro (HUMALOG ) 100 UNIT/ML injection INJECT 5 UNITS INTO THE SKIN THREE TIMES DAILY BEFORE EACH MEAL   losartan  (COZAAR ) 25 MG tablet Take 1 tablet (25 mg total) by mouth daily.   metFORMIN  (GLUCOPHAGE ) 500 MG tablet Take 2 tablets in the morning and 1 tablet in the evening.   metoprolol  succinate (TOPROL  XL) 50 MG 24 hr tablet Take 1 tablet (50 mg total) by mouth daily. Take with or immediately following a  meal.   nitroGLYCERIN  (NITROSTAT ) 0.4 MG SL tablet Place 1 tablet (0.4 mg total) under the tongue every 5 (five) minutes as needed for chest pain.   pantoprazole  (PROTONIX ) 20 MG tablet Take 20 mg by mouth as needed for heartburn.   Push Button Safety Lancets 28G MISC 1 Lancet by Does not apply route 3 (three) times daily.   tamsulosin  (FLOMAX ) 0.4 MG CAPS capsule Take 1 capsule (0.4 mg total) by mouth daily.   trospium (SANCTURA) 20 MG tablet Take 20 mg by mouth as needed.   Vitamin D, Ergocalciferol, (DRISDOL) 1.25 MG (50000 UNIT) CAPS capsule Take 50,000 Units by mouth once a week.   Current Facility-Administered Medications (Other)  Medication Route   Insulin  Pen Needle (NOVOFINE) 100 each Subcutaneous   REVIEW OF SYSTEMS:  ALLERGIES Allergies[1] PAST MEDICAL HISTORY Past Medical History:  Diagnosis Date   Aortic regurgitation    Mild by echo 10/2023   Ascending aorta dilatation    42 mm by 2D echo 10/2023   Benign prostatic hyperplasia (BPH) with urinary urge incontinence 11/13/2017   Chronic left shoulder pain 11/13/2017   Chronic non-seasonal allergic rhinitis 02/17/2006   Chronic systolic heart failure (HCC) 10/31/2016   Echo (05/03/2015): LVEF 45-50%, grade 2 diastolic dysfunction.  2D echo 10/2023 with EF 35 to 40% and antero-lateral wall, mid and distal inferior wall, posterior wall, basal anteroseptal segment, mid inferoseptal segment, and basal inferoseptal segment are hypokinetic.  Coronary artery disease involving native coronary artery of native heart without angina pectoris 09/30/2007   s/p CABG on 01/05/2013: left internal mammary artery to left anterior descending, saphenous vein graft to obtuse marginal 1, sequential saphenous vein graft to acute marginal and posterior descending   Essential hypertension 11/04/2005   Gastroesophageal reflux disease with hiatal hernia 11/04/2005   History of prostate cancer 02/17/2006   Diagnosed 1995, s/p seed implantation in the late  1990's, PSA undetectable 04/10/2016   Hyperlipidemia 11/04/2005   Mitral regurgitation    Mild by echo 10/2023   Obesity (BMI 30.0-34.9) 11/13/2017   Peripheral arterial disease 09/30/2007   with claudication, failed attempt at LLE PTCA   Type 2 diabetes mellitus with microalbuminuria, with long-term current use of insulin  (HCC) 11/13/2017   Type 2 diabetes mellitus with mild 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, bilateral (HCC) 03/24/2018   Type 2 diabetes mellitus with peripheral neuropathy (HCC) 11/04/2005   Urethral stricture in male 11/13/2017   Noted on cystoscopy 12/18/2014.  Reportedly asked to do chronic intermittent self catheterizations, but has not.   Past Surgical History:  Procedure Laterality Date   BREAST SURGERY     CARDIAC CATHETERIZATION  10/05/2008   REVEALS HYPOKINESIS OF THE LATERAL WALL AND EF 50-55%   CORONARY ANGIOPLASTY WITH STENT PLACEMENT     CORONARY ARTERY BYPASS GRAFT N/A 01/05/2013   Procedure: CORONARY ARTERY BYPASS GRAFTING (CABG);  Surgeon: Elspeth JAYSON Millers, MD;  Location: [facility] OR;  Service: Open Heart Surgery;  Laterality: N/A;   INTRAOPERATIVE TRANSESOPHAGEAL ECHOCARDIOGRAM N/A 01/05/2013   Procedure: INTRAOPERATIVE TRANSESOPHAGEAL ECHOCARDIOGRAM;  Surgeon: Elspeth JAYSON Millers, MD;  Location: [facility] OR;  Service: Open Heart Surgery;  Laterality: N/A;   LEFT HEART CATHETERIZATION WITH CORONARY ANGIOGRAM N/A 01/03/2013   Procedure: LEFT HEART CATHETERIZATION WITH CORONARY ANGIOGRAM;  Surgeon: Dorn JINNY Lesches, MD;  Location: [facility] CATH LAB;  Service: Cardiovascular;  Laterality: N/A;   LOWER EXTREMITY ANGIOGRAM Right 12/07/2012   unsuccessful attempt at percutaneous revascularization of a calcified long segment chronic total occlusion mid left SFA /notes 12/07/2012   LOWER EXTREMITY ANGIOGRAM N/A 12/07/2012   Procedure: LOWER EXTREMITY ANGIOGRAM;  Surgeon: Dorn JINNY Lesches, MD;  Location: [facility] CATH LAB;  Service: Cardiovascular;  Laterality:  N/A;   PROSTATE SURGERY  10/04/1988   FAMILY HISTORY Family History  Problem Relation Age of Onset   Kidney failure Mother    Hypertension Father    Stroke Father    Other Sister    Other Brother    Other Brother    Other Brother    Other Brother    Other Brother    Other Brother    Other Brother    Other Brother    Healthy Daughter    Healthy Daughter    Healthy Daughter    Healthy Son    SOCIAL HISTORY Social History[2]     OPHTHALMIC EXAM:  Not recorded    IMAGING AND PROCEDURES  Imaging and Procedures for 03/07/2024        ASSESSMENT/PLAN:   ICD-10-CM   1. Retinal edema  H35.81      1.  2.  3.  Ophthalmic Meds Ordered this visit:  No orders of the defined types were placed in this encounter.    No follow-ups on file.  There are no Patient Instructions on file for this visit.  Explained the diagnoses, plan, and follow up with the patient and they expressed understanding.  Patient expressed understanding of the importance of proper  follow up care.   This document serves as a record of services personally performed by Redell JUDITHANN Hans, MD, PhD. It was created on their behalf by Paulina Jamse Gay an ophthalmic technician. The creation of this record is the provider's dictation and/or activities during the visit.   Electronically signed by: Paulina JONETTA Gay  02/25/24  12:23 PM   Redell JUDITHANN Hans, M.D., Ph.D. Diseases & Surgery of the Retina and Vitreous [facility] 03/07/2024  Abbreviations: M myopia (nearsighted); A astigmatism; H hyperopia (farsighted); P presbyopia; Mrx spectacle prescription;  CTL contact lenses; OD right eye; OS left eye; OU both eyes  XT exotropia; ET esotropia; PEK punctate epithelial keratitis; PEE punctate epithelial erosions; DES dry eye syndrome; MGD meibomian gland dysfunction; ATs artificial tears; PFAT's preservative free artificial tears; NSC nuclear sclerotic cataract; PSC posterior subcapsular  cataract; ERM epi-retinal membrane; PVD posterior vitreous detachment; RD retinal detachment; DM diabetes mellitus; DR diabetic retinopathy; NPDR non-proliferative diabetic retinopathy; PDR proliferative diabetic retinopathy; CSME clinically significant macular edema; DME diabetic macular edema; dbh dot blot hemorrhages; CWS cotton wool spot; POAG primary open angle glaucoma; C/D cup-to-disc ratio; HVF humphrey visual field; GVF goldmann visual field; OCT optical coherence tomography; IOP intraocular pressure; BRVO Branch retinal vein occlusion; CRVO central retinal vein occlusion; CRAO central retinal artery occlusion; BRAO branch retinal artery occlusion; RT retinal tear; SB scleral buckle; PPV pars plana vitrectomy; VH Vitreous hemorrhage; PRP panretinal laser photocoagulation; IVK intravitreal kenalog; VMT vitreomacular traction; MH Macular hole;  NVD neovascularization of the disc; NVE neovascularization elsewhere; AREDS age related eye disease study; ARMD age related macular degeneration; POAG primary open angle glaucoma; EBMD epithelial/anterior basement membrane dystrophy; ACIOL anterior chamber intraocular lens; IOL intraocular lens; PCIOL posterior chamber intraocular lens; Phaco/IOL phacoemulsification with intraocular lens placement; PRK photorefractive keratectomy; LASIK laser assisted in situ keratomileusis; HTN hypertension; DM diabetes mellitus; COPD chronic obstructive pulmonary disease     [1] No Known Allergies [2]  Social History Tobacco Use   Smoking status: Former    Current packs/day: 0.00    Average packs/day: 0.8 packs/day for 20.0 years (15.0 ttl pk-yrs)    Types: Cigarettes    Start date: 07/08/1960    Quit date: 07/08/1980    Years since quitting: 43.6   Smokeless tobacco: Never  Vaping Use   Vaping status: Never Used  Substance Use Topics   Alcohol use: No    Alcohol/week: 0.0 standard drinks of alcohol    Comment: 12/07/2012 quit drinking alcohol > 25 yr ago   Drug  use: No   "

## 2024-03-07 ENCOUNTER — Encounter (INDEPENDENT_AMBULATORY_CARE_PROVIDER_SITE_OTHER): Admitting: Ophthalmology

## 2024-03-07 ENCOUNTER — Encounter (INDEPENDENT_AMBULATORY_CARE_PROVIDER_SITE_OTHER): Payer: Self-pay

## 2024-03-07 DIAGNOSIS — H3581 Retinal edema: Secondary | ICD-10-CM

## 2024-03-08 ENCOUNTER — Other Ambulatory Visit: Payer: Self-pay | Admitting: Endocrinology

## 2024-03-08 DIAGNOSIS — E113293 Type 2 diabetes mellitus with mild 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, bilateral: Secondary | ICD-10-CM

## 2024-03-11 ENCOUNTER — Other Ambulatory Visit

## 2024-03-14 ENCOUNTER — Other Ambulatory Visit
